# Patient Record
Sex: Female | Born: 1970 | Race: White | Hispanic: No | Smoking: Current every day smoker
Health system: Southern US, Community
[De-identification: ages and names within clinical notes are randomized; demographics above are authoritative.]

## PROBLEM LIST (undated history)

## (undated) DIAGNOSIS — M797 Fibromyalgia: Secondary | ICD-10-CM

## (undated) DIAGNOSIS — M549 Dorsalgia, unspecified: Secondary | ICD-10-CM

## (undated) DIAGNOSIS — G8929 Other chronic pain: Secondary | ICD-10-CM

## (undated) DIAGNOSIS — F419 Anxiety disorder, unspecified: Secondary | ICD-10-CM

## (undated) DIAGNOSIS — J449 Chronic obstructive pulmonary disease, unspecified: Secondary | ICD-10-CM

## (undated) DIAGNOSIS — D6851 Activated protein C resistance: Secondary | ICD-10-CM

## (undated) DIAGNOSIS — J370 Chronic laryngitis: Secondary | ICD-10-CM

## (undated) DIAGNOSIS — F329 Major depressive disorder, single episode, unspecified: Secondary | ICD-10-CM

## (undated) DIAGNOSIS — K219 Gastro-esophageal reflux disease without esophagitis: Secondary | ICD-10-CM

## (undated) DIAGNOSIS — M419 Scoliosis, unspecified: Secondary | ICD-10-CM

## (undated) DIAGNOSIS — F32A Depression, unspecified: Secondary | ICD-10-CM

## (undated) DIAGNOSIS — I10 Essential (primary) hypertension: Secondary | ICD-10-CM

## (undated) DIAGNOSIS — I509 Heart failure, unspecified: Secondary | ICD-10-CM

## (undated) DIAGNOSIS — F410 Panic disorder [episodic paroxysmal anxiety] without agoraphobia: Secondary | ICD-10-CM

## (undated) DIAGNOSIS — J189 Pneumonia, unspecified organism: Secondary | ICD-10-CM

## (undated) DIAGNOSIS — F909 Attention-deficit hyperactivity disorder, unspecified type: Secondary | ICD-10-CM

## (undated) DIAGNOSIS — IMO0002 Reserved for concepts with insufficient information to code with codable children: Secondary | ICD-10-CM

## (undated) DIAGNOSIS — T7840XA Allergy, unspecified, initial encounter: Secondary | ICD-10-CM

## (undated) DIAGNOSIS — I4891 Unspecified atrial fibrillation: Secondary | ICD-10-CM

## (undated) DIAGNOSIS — I219 Acute myocardial infarction, unspecified: Secondary | ICD-10-CM

## (undated) HISTORY — DX: Gastro-esophageal reflux disease without esophagitis: K21.9

## (undated) HISTORY — PX: NECK SURGERY: SHX720

## (undated) HISTORY — PX: TUBAL LIGATION: SHX77

## (undated) HISTORY — DX: Unspecified atrial fibrillation: I48.91

## (undated) HISTORY — DX: Chronic obstructive pulmonary disease, unspecified: J44.9

## (undated) HISTORY — PX: BACK SURGERY: SHX140

## (undated) HISTORY — DX: Allergy, unspecified, initial encounter: T78.40XA

## (undated) HISTORY — DX: Heart failure, unspecified: I50.9

## (undated) HISTORY — DX: Acute myocardial infarction, unspecified: I21.9

---

## 1999-03-09 ENCOUNTER — Ambulatory Visit: Admission: RE | Admit: 1999-03-09 | Discharge: 1999-03-09 | Payer: Self-pay | Admitting: Neurosurgery

## 1999-04-07 ENCOUNTER — Inpatient Hospital Stay (HOSPITAL_COMMUNITY): Admission: RE | Admit: 1999-04-07 | Discharge: 1999-04-08 | Payer: Self-pay | Admitting: Neurosurgery

## 1999-05-02 ENCOUNTER — Encounter: Admission: RE | Admit: 1999-05-02 | Discharge: 1999-05-02 | Payer: Self-pay | Admitting: Neurosurgery

## 1999-07-04 ENCOUNTER — Encounter: Admission: RE | Admit: 1999-07-04 | Discharge: 1999-07-04 | Payer: Self-pay | Admitting: Neurosurgery

## 1999-10-03 ENCOUNTER — Encounter: Admission: RE | Admit: 1999-10-03 | Discharge: 1999-10-03 | Payer: Self-pay | Admitting: Neurosurgery

## 2000-01-20 ENCOUNTER — Encounter: Admission: RE | Admit: 2000-01-20 | Discharge: 2000-01-20 | Payer: Self-pay | Admitting: Neurosurgery

## 2000-01-25 ENCOUNTER — Ambulatory Visit (HOSPITAL_COMMUNITY): Admission: RE | Admit: 2000-01-25 | Discharge: 2000-01-25 | Payer: Self-pay | Admitting: Neurosurgery

## 2000-11-01 ENCOUNTER — Ambulatory Visit (HOSPITAL_COMMUNITY): Admission: RE | Admit: 2000-11-01 | Discharge: 2000-11-01 | Payer: Self-pay

## 2000-12-14 ENCOUNTER — Encounter: Admission: RE | Admit: 2000-12-14 | Discharge: 2000-12-14 | Payer: Self-pay | Admitting: Neurosurgery

## 2000-12-14 ENCOUNTER — Encounter: Payer: Self-pay | Admitting: Neurosurgery

## 2002-10-19 ENCOUNTER — Ambulatory Visit (HOSPITAL_COMMUNITY): Admission: RE | Admit: 2002-10-19 | Discharge: 2002-10-19 | Payer: Self-pay | Admitting: Family Medicine

## 2002-10-19 ENCOUNTER — Encounter: Payer: Self-pay | Admitting: Family Medicine

## 2003-02-07 ENCOUNTER — Ambulatory Visit (HOSPITAL_COMMUNITY): Admission: RE | Admit: 2003-02-07 | Discharge: 2003-02-07 | Payer: Self-pay | Admitting: Family Medicine

## 2003-08-15 ENCOUNTER — Ambulatory Visit (HOSPITAL_COMMUNITY): Admission: RE | Admit: 2003-08-15 | Discharge: 2003-08-15 | Payer: Self-pay | Admitting: Family Medicine

## 2003-10-25 ENCOUNTER — Ambulatory Visit (HOSPITAL_COMMUNITY): Admission: RE | Admit: 2003-10-25 | Discharge: 2003-10-26 | Payer: Self-pay | Admitting: Neurosurgery

## 2004-08-21 ENCOUNTER — Ambulatory Visit (HOSPITAL_COMMUNITY): Admission: RE | Admit: 2004-08-21 | Discharge: 2004-08-21 | Payer: Self-pay | Admitting: Family Medicine

## 2004-09-17 ENCOUNTER — Emergency Department (HOSPITAL_COMMUNITY): Admission: EM | Admit: 2004-09-17 | Discharge: 2004-09-17 | Payer: Self-pay | Admitting: Emergency Medicine

## 2004-09-18 ENCOUNTER — Ambulatory Visit: Payer: Self-pay | Admitting: Orthopedic Surgery

## 2004-10-09 ENCOUNTER — Ambulatory Visit: Payer: Self-pay | Admitting: Orthopedic Surgery

## 2004-11-20 ENCOUNTER — Ambulatory Visit: Payer: Self-pay | Admitting: Orthopedic Surgery

## 2004-11-25 ENCOUNTER — Ambulatory Visit (HOSPITAL_COMMUNITY): Admission: RE | Admit: 2004-11-25 | Discharge: 2004-11-25 | Payer: Self-pay | Admitting: Orthopedic Surgery

## 2004-12-03 ENCOUNTER — Ambulatory Visit: Payer: Self-pay | Admitting: Orthopedic Surgery

## 2005-01-28 ENCOUNTER — Ambulatory Visit: Payer: Self-pay | Admitting: Orthopedic Surgery

## 2005-02-11 ENCOUNTER — Ambulatory Visit (HOSPITAL_COMMUNITY): Admission: RE | Admit: 2005-02-11 | Discharge: 2005-02-11 | Payer: Self-pay | Admitting: Neurosurgery

## 2005-02-16 ENCOUNTER — Inpatient Hospital Stay (HOSPITAL_COMMUNITY): Admission: RE | Admit: 2005-02-16 | Discharge: 2005-02-17 | Payer: Self-pay | Admitting: Neurosurgery

## 2005-06-01 ENCOUNTER — Emergency Department (HOSPITAL_COMMUNITY): Admission: EM | Admit: 2005-06-01 | Discharge: 2005-06-01 | Payer: Self-pay | Admitting: Emergency Medicine

## 2005-06-22 ENCOUNTER — Ambulatory Visit: Payer: Self-pay | Admitting: Orthopedic Surgery

## 2005-06-30 ENCOUNTER — Encounter (HOSPITAL_COMMUNITY): Admission: RE | Admit: 2005-06-30 | Discharge: 2005-07-30 | Payer: Self-pay | Admitting: Orthopedic Surgery

## 2005-08-03 ENCOUNTER — Ambulatory Visit: Payer: Self-pay | Admitting: Orthopedic Surgery

## 2005-08-12 ENCOUNTER — Ambulatory Visit (HOSPITAL_COMMUNITY): Admission: RE | Admit: 2005-08-12 | Discharge: 2005-08-12 | Payer: Self-pay | Admitting: Neurosurgery

## 2006-01-07 ENCOUNTER — Emergency Department (HOSPITAL_COMMUNITY): Admission: EM | Admit: 2006-01-07 | Discharge: 2006-01-08 | Payer: Self-pay | Admitting: Emergency Medicine

## 2006-09-01 ENCOUNTER — Encounter
Admission: RE | Admit: 2006-09-01 | Discharge: 2006-09-01 | Payer: Self-pay | Admitting: Physical Medicine and Rehabilitation

## 2007-01-17 ENCOUNTER — Emergency Department (HOSPITAL_COMMUNITY): Admission: EM | Admit: 2007-01-17 | Discharge: 2007-01-17 | Payer: Self-pay | Admitting: Emergency Medicine

## 2007-02-18 ENCOUNTER — Ambulatory Visit (HOSPITAL_COMMUNITY): Admission: RE | Admit: 2007-02-18 | Discharge: 2007-02-18 | Payer: Self-pay | Admitting: Family Medicine

## 2007-04-04 ENCOUNTER — Ambulatory Visit (HOSPITAL_COMMUNITY): Admission: RE | Admit: 2007-04-04 | Discharge: 2007-04-04 | Payer: Self-pay | Admitting: Family Medicine

## 2007-05-24 ENCOUNTER — Ambulatory Visit (HOSPITAL_COMMUNITY): Admission: RE | Admit: 2007-05-24 | Discharge: 2007-05-24 | Payer: Self-pay | Admitting: Family Medicine

## 2008-06-16 ENCOUNTER — Emergency Department (HOSPITAL_COMMUNITY): Admission: EM | Admit: 2008-06-16 | Discharge: 2008-06-16 | Payer: Self-pay | Admitting: Emergency Medicine

## 2008-06-18 ENCOUNTER — Ambulatory Visit (HOSPITAL_COMMUNITY): Payer: Self-pay | Admitting: Family Medicine

## 2008-06-18 ENCOUNTER — Encounter (HOSPITAL_COMMUNITY): Admission: RE | Admit: 2008-06-18 | Discharge: 2008-07-18 | Payer: Self-pay | Admitting: Family Medicine

## 2008-07-31 ENCOUNTER — Emergency Department (HOSPITAL_COMMUNITY): Admission: EM | Admit: 2008-07-31 | Discharge: 2008-07-31 | Payer: Self-pay | Admitting: Emergency Medicine

## 2008-09-21 ENCOUNTER — Ambulatory Visit (HOSPITAL_COMMUNITY): Admission: RE | Admit: 2008-09-21 | Discharge: 2008-09-21 | Payer: Self-pay | Admitting: Family Medicine

## 2008-10-26 ENCOUNTER — Ambulatory Visit (HOSPITAL_COMMUNITY): Admission: RE | Admit: 2008-10-26 | Discharge: 2008-10-26 | Payer: Self-pay | Admitting: Neurosurgery

## 2008-12-27 ENCOUNTER — Encounter: Admission: RE | Admit: 2008-12-27 | Discharge: 2008-12-27 | Payer: Self-pay | Admitting: Neurosurgery

## 2009-01-01 ENCOUNTER — Ambulatory Visit (HOSPITAL_COMMUNITY): Admission: RE | Admit: 2009-01-01 | Discharge: 2009-01-01 | Payer: Self-pay | Admitting: Neurosurgery

## 2009-03-05 ENCOUNTER — Encounter: Admission: RE | Admit: 2009-03-05 | Discharge: 2009-03-05 | Payer: Self-pay | Admitting: Neurosurgery

## 2009-05-30 ENCOUNTER — Encounter: Admission: RE | Admit: 2009-05-30 | Discharge: 2009-05-30 | Payer: Self-pay | Admitting: Neurosurgery

## 2009-08-29 ENCOUNTER — Encounter: Admission: RE | Admit: 2009-08-29 | Discharge: 2009-08-29 | Payer: Self-pay | Admitting: Neurosurgery

## 2009-09-23 ENCOUNTER — Ambulatory Visit (HOSPITAL_COMMUNITY): Admission: RE | Admit: 2009-09-23 | Discharge: 2009-09-24 | Payer: Self-pay | Admitting: Neurosurgery

## 2009-12-16 ENCOUNTER — Ambulatory Visit (HOSPITAL_COMMUNITY)
Admission: RE | Admit: 2009-12-16 | Discharge: 2009-12-16 | Payer: Self-pay | Source: Home / Self Care | Admitting: Neurosurgery

## 2010-01-28 ENCOUNTER — Ambulatory Visit (HOSPITAL_COMMUNITY)
Admission: RE | Admit: 2010-01-28 | Discharge: 2010-01-28 | Payer: Self-pay | Source: Home / Self Care | Attending: Neurosurgery | Admitting: Neurosurgery

## 2010-01-29 ENCOUNTER — Emergency Department (HOSPITAL_COMMUNITY)
Admission: EM | Admit: 2010-01-29 | Discharge: 2010-01-29 | Payer: Self-pay | Source: Home / Self Care | Admitting: Emergency Medicine

## 2010-02-01 ENCOUNTER — Encounter: Payer: Self-pay | Admitting: Orthopaedic Surgery

## 2010-02-01 ENCOUNTER — Encounter: Payer: Self-pay | Admitting: Family Medicine

## 2010-02-02 ENCOUNTER — Encounter: Payer: Self-pay | Admitting: Family Medicine

## 2010-02-13 ENCOUNTER — Other Ambulatory Visit (HOSPITAL_COMMUNITY): Payer: Medicaid Other

## 2010-02-13 LAB — TYPE AND SCREEN: Antibody Screen: NEGATIVE

## 2010-02-13 LAB — CBC
HCT: 44.3 % (ref 36.0–46.0)
Hemoglobin: 15.9 g/dL — ABNORMAL HIGH (ref 12.0–15.0)
MCHC: 35.9 g/dL (ref 30.0–36.0)
MCV: 95.7 fL (ref 78.0–100.0)
RDW: 13.1 % (ref 11.5–15.5)

## 2010-02-13 LAB — ABO/RH: ABO/RH(D): A POS

## 2010-02-13 LAB — SURGICAL PCR SCREEN

## 2010-02-17 ENCOUNTER — Encounter (HOSPITAL_COMMUNITY): Payer: Medicaid Other

## 2010-02-17 ENCOUNTER — Inpatient Hospital Stay (HOSPITAL_COMMUNITY)
Admission: RE | Admit: 2010-02-17 | Discharge: 2010-02-19 | DRG: 460 | Disposition: A | Discharge status: Home / Self Care | Payer: Medicaid Other | Source: Ambulatory Visit | Attending: Neurosurgery | Admitting: Neurosurgery

## 2010-02-17 ENCOUNTER — Inpatient Hospital Stay (HOSPITAL_COMMUNITY): Payer: Medicaid Other

## 2010-02-17 DIAGNOSIS — M51379 Other intervertebral disc degeneration, lumbosacral region without mention of lumbar back pain or lower extremity pain: Principal | ICD-10-CM | POA: Diagnosis present

## 2010-02-17 DIAGNOSIS — J4489 Other specified chronic obstructive pulmonary disease: Secondary | ICD-10-CM | POA: Diagnosis present

## 2010-02-17 DIAGNOSIS — Z472 Encounter for removal of internal fixation device: Secondary | ICD-10-CM

## 2010-02-17 DIAGNOSIS — Z79899 Other long term (current) drug therapy: Secondary | ICD-10-CM

## 2010-02-17 DIAGNOSIS — M5126 Other intervertebral disc displacement, lumbar region: Secondary | ICD-10-CM | POA: Diagnosis present

## 2010-02-17 DIAGNOSIS — F17200 Nicotine dependence, unspecified, uncomplicated: Secondary | ICD-10-CM | POA: Diagnosis present

## 2010-02-17 DIAGNOSIS — J449 Chronic obstructive pulmonary disease, unspecified: Secondary | ICD-10-CM | POA: Diagnosis present

## 2010-02-17 DIAGNOSIS — E669 Obesity, unspecified: Secondary | ICD-10-CM | POA: Diagnosis present

## 2010-02-17 DIAGNOSIS — M5137 Other intervertebral disc degeneration, lumbosacral region: Principal | ICD-10-CM | POA: Diagnosis present

## 2010-02-19 LAB — BASIC METABOLIC PANEL
Calcium: 8.5 mg/dL (ref 8.4–10.5)
Chloride: 106 mEq/L (ref 96–112)
Creatinine, Ser: 0.73 mg/dL (ref 0.4–1.2)
Sodium: 138 mEq/L (ref 135–145)

## 2010-02-20 NOTE — Op Note (Signed)
[name], [name]              ACCOUNT NO.:  [id number]  MEDICAL RECORD NO.:  [id number]           PATIENT TYPE:  I  LOCATION:  3009                         FACILITY:  [facility]  PHYSICIAN:  Donalee Citrin, M.D.        DATE OF BIRTH:  1970-09-05  DATE OF PROCEDURE:  02/17/2010 DATE OF DISCHARGE:                              OPERATIVE REPORT   PREOPERATIVE DIAGNOSIS:  Degenerative disk disease at L4-5 with ruptured disk at L4-5 right.  PROCEDURE:  L4-5 posterior lumbar interbody fusion and exploration of fusion, removal of hardware at L5-S1 using combination hybrid Telamon PEEK cages 10 x 22 mm packed with locally harvested autograft, mixed with Actifuse and tangent allograft wedge, 10 x 26 mm pedicle screw fixation using a 6.35 Legacy pedicle screw system, posterolateral arthrodesis at L4-5 using locally harvested autograft mixed with Actifuse.  SURGEON:  Donalee Citrin, MD.  ASSISTANT:  Kathaleen Maser. Pool, MD  ANESTHESIA:  General endotracheal.  HISTORY OF PRESENT ILLNESS:  The patient is a very pleasant 40 year old female who has had L5-S1 fusion many many years ago.  Initially, did very well.  However, over the last several weeks or months, had progressive worsening of right leg pain radiating down back of her leg and top of foot and big toe consistent with L5 nerve root pattern. Repeat MRI scan showed disk herniation at L4-5 and degenerative disk disease at L4-5 with severe lateral recess stenosis bilaterally.  Due to patient's failure of conservative treatment with anti-inflammatories, physical therapy, and injections and her old surgery being a lumbar fusion at L5-S1, patient was recommended decompression-stabilization procedure.  I went over risks and benefits of the operation with the patient.  She understands and agrees to proceed forward.  DESCRIPTION OF PROCEDURE:  The patient was brought to the OR, was induced under general anesthesia, was positioned prone on the  Wilson frame.  Back was prepped and draped in usual sterile fashion.  The old incision was opened up and extended cephalad.  The scar tissue was dissected free, and subperiosteal dissection was carried on the lamina of L4, exposing the T-piece at L4 and L5.  The old hardware was also exposed.  The old fusion was solid, so the nuts removed, the rods removed, and the S1 screws removed.  Then, the spinous process at L4 was removed.  Central decompression was begun.  Complete medial facetectomies were performed bilaterally.  There was marked facet arthropathy causing severe __________ pressure on the thecal sac, this was all teased away and removed in piecemeal fashion until a complete decompression had been completed and L4 nerve root and L5 nerve root had been unroofed.  The disk herniation was immediately identified, expressing itself underneath the axilla of L4 root just above the disk space at L4-5 on the right, so an annulotomy was made at the disk space on the right and this disk herniation was removed.  Then, attention was taken to the pedicle screw placement.  A pilot hole was drilled at L4 on the right, cannulated with awl probe with a tap of the 5/5 tap and then a 6 x 45 screw  inserted on the right L4.  In a similar fashion, the L4 screw was inserted on the left side.  After all screws had been placed, tensioning the interbody work, a distractor was placed on the left side. Disk space was cleaned out using a size 10 rotating cutter and chisel. Central disk was then subsequently removed.  A left-sided tangent allograft wedge was then inserted.  The distractor was removed on the right, fluoroscopy __________ to confirm depth and trajectory.  Then, in a similar fashion, disk space was prepared on the right,  the disk herniation had been completely incised.  There was no further stenosis. Local autograft was packed centrally.  A right-sided tangent was then inserted. After all the  interbody work had been done, fluoroscopy confirmed good position.  Attention was taken to the posterolateral space.  Aggressive decortication was carried out in the TPs and lateral gutters.  Remainder of the local autograft was packed posterolaterally. Then, the 50 mm rods were placed, tapped and tightened down at L5.  The L4 screw was compressing against L5, and a 420 cross-link was placed. All the neural foramina were reinspected and confirmed patency.  Gelfoam was laid on top of the dura.  Meticulous hemostasis was maintained.  A large Hemovac drain was placed.  The wound was closed in layers with Vicryl, and skin was closed with running 4-0 subcuticular.  Benzoin and Steri-Strips were applied.  The patient went to recovery room in stable condition.          ______________________________ Donalee Citrin, M.D.     GC/MEDQ  D:  02/17/2010  T:  02/18/2010  Job:  [id number]  Electronically Signed by Donalee Citrin M.D. on 02/20/2010 09:01:34 AM

## 2010-03-27 ENCOUNTER — Ambulatory Visit
Admission: RE | Admit: 2010-03-27 | Discharge: 2010-03-27 | Disposition: A | Discharge status: Home / Self Care | Payer: Medicaid Other | Source: Ambulatory Visit | Attending: Neurosurgery | Admitting: Neurosurgery

## 2010-03-27 ENCOUNTER — Other Ambulatory Visit: Payer: Self-pay | Admitting: Neurosurgery

## 2010-03-27 DIAGNOSIS — M5137 Other intervertebral disc degeneration, lumbosacral region: Secondary | ICD-10-CM

## 2010-03-27 DIAGNOSIS — M47817 Spondylosis without myelopathy or radiculopathy, lumbosacral region: Secondary | ICD-10-CM

## 2010-03-27 LAB — SURGICAL PCR SCREEN: MRSA, PCR: NEGATIVE

## 2010-03-27 LAB — CBC
HCT: 46.1 % — ABNORMAL HIGH (ref 36.0–46.0)
Hemoglobin: 16.8 g/dL — ABNORMAL HIGH (ref 12.0–15.0)
MCHC: 36.4 g/dL — ABNORMAL HIGH (ref 30.0–36.0)
MCV: 97.5 fL (ref 78.0–100.0)
RDW: 13.4 % (ref 11.5–15.5)
WBC: 11.9 10*3/uL — ABNORMAL HIGH (ref 4.0–10.5)

## 2010-04-18 LAB — CBC
HCT: 46.5 % — ABNORMAL HIGH (ref 36.0–46.0)
Hemoglobin: 16.4 g/dL — ABNORMAL HIGH (ref 12.0–15.0)
MCV: 97.7 fL (ref 78.0–100.0)
WBC: 9.3 10*3/uL (ref 4.0–10.5)

## 2010-05-27 ENCOUNTER — Other Ambulatory Visit: Payer: Self-pay | Admitting: Neurosurgery

## 2010-05-27 ENCOUNTER — Ambulatory Visit
Admission: RE | Admit: 2010-05-27 | Discharge: 2010-05-27 | Disposition: A | Discharge status: Home / Self Care | Payer: Medicaid Other | Source: Ambulatory Visit | Attending: Neurosurgery | Admitting: Neurosurgery

## 2010-05-27 DIAGNOSIS — M545 Low back pain: Secondary | ICD-10-CM

## 2010-05-30 NOTE — Op Note (Signed)
[name], [name]              ACCOUNT NO.:  [id number]   MEDICAL RECORD NO.:  [id number]          PATIENT TYPE:  OIB   LOCATION:  NA                           FACILITY:  [facility]   PHYSICIAN:  Donalee Citrin, M.D.        DATE OF BIRTH:  [birth year]   DATE OF PROCEDURE:  10/25/2003  DATE OF DISCHARGE:                                 OPERATIVE REPORT   PREOPERATIVE DIAGNOSIS:  Left S1 radiculopathy from a large ruptured disk at  L5-S1 left.   PROCEDURE:  Lumbar laminectomy and microdiskectomy, L5-S1 left, with  microscopic dissection of the left S1 nerve root.   SURGEON:  Donalee Citrin, M.D.   ASSISTANT:  Tia Alert, M.D.   ANESTHESIA:  General endotracheal.   HISTORY OF PRESENT ILLNESS:  The patient is a very pleasant 40 year old  female with longstanding back and left leg pain radiating down the outside  of her foot and bottom of the foot, consistent with S1 radiculopathy.  The  patient failed all forms of conservative treatment.  Preoperative imaging  showed large ruptured disk with free fragment compressing the left S1 nerve  root.  Due to the patient's failure of conservative treatment and physical  exam and preoperative imaging, the patient was recommended a left-sided  laminectomy and microdiskectomy, L5-S1 left.  I extensively went over the  risks and benefits of surgery with her.  She understands and agreed to  proceed forward.   The patient was brought into the OR, was induced under general anesthesia  and positioned prone on the Wilson frame.  The back was prepped and draped  in the routine sterile fashion.  A preop x-ray localized the L5 spinous  process.  A midline incision was made just inferior to this after  infiltration with 10 mL of lidocaine with epinephrine.  Subperiosteal  dissection was carried out in the lamina on the left at L5 and S1; however,  intraoperative x-ray confirmed localization of the L4-5 disk space, so  attention was taken one disk space below  this.  After the periosteal  dissection was carried out in the lamina of S1, the retractor was  repositioned and the inferior aspect of L5 and the medial aspect of the  facet complex was removed with a 3 mm Kerrison punch, the superior aspect of  the lamina of S1 was removed, and the neural foramen was also dissected out  with a Kerrison punch.  The ligamentum flavum was visualized.  This was  removed in piecemeal fashion, exposing the thecal sac.  Then the operating  microscope was draped and under microscopic illumination, the remainder of  the S1 nerve root was opened up.  During the distal foraminotomy, there was  noted to be a small little tear on the distal nerve root sleeve and the  dura.  The arachnoid appeared to be intact.  There was no spinal fluid  leaking.  This was __________ opened up in nerve.  It was felt that this  would not result in spinal fluid leakage; however, this was packed away and  attention was taken back  more proximal.  The undersurface of the gutter was  then underbitten.  The disk space was identified.  Epidural vein was  coagulated.  There was noted to be an annular rent with a free fragment  migrated underneath the proximal aspect of the nerve root.  This was teased  away with a blunt nerve hook.  Then the annulotomy was inspected.  The disk  space was radically cleaned out.  Several large fragments were removed from  the central compartment as well as lateral compartment.  At the end of the  diskectomy there was no further stenosis either on the thecal sac or S1  nerve root.  Then the wound was copiously irrigated and meticulous  hemostasis was maintained.  Gelfoam was overload on top of the dura, the  muscle and fascia reapproximated with 0 interrupted Vicryl, the subcutaneous  tissue  was closed with 2-0 interrupted Vicryl, and the skin was closed with a  running 4-0 subcuticular.  Benzoin and Steri-Strips were applied.  The  patient was taken from the  operating room in stable condition.  At the end  of the case, all needle counts and sponge counts were correct.       GC/MEDQ  D:  10/25/2003  T:  10/25/2003  Job:  [id number]

## 2010-05-30 NOTE — H&P (Signed)
[place]  Patient:    [name], [name]                     MRN: [id number] Adm. Date:  [id number] Disc. Date: [id number] Attending:  Tressie Stalker D                         History and Physical  CHIEF COMPLAINT:  Neck pain, right arm pain.  HISTORY OF PRESENT ILLNESS:  The patient is a 40 year old white female who has ad pain in her neck and arm over the last two years.  She has been treated by her primary physician with medications.  Unfortunately, she never improved.  She was worked up with a cervical MRI.  It demonstrated C5-6 degenerative joint disease, herniated nucleus pulposus, spinal stenosis.  The patient failed medical management and therefore weighed the risks and benefits and alternatives to surgery and decided to proceed with an anterior cervical diskectomy, fusion and plating.  The patient complains of neck pain with radiation to her right shoulder and proximal right arm.  PAST MEDICAL HISTORY:  Positive for sinus problems, anemia.  PAST SURGICAL HISTORY:  Cesarean sections x 3.  Dilatation and curettage in 1991, tubal ligation in 1994.  FAMILY MEDICAL HISTORY:  The patients mother is age 41 in poor health.  She has  cerebral palsy.  The patients father died age 1.  He was diabetic and used alcohol.  He had a positive myocardial infarction and intracerebral hemorrhage. A maternal grandmother has breast cancer.  SOCIAL HISTORY:  The patient is married.  She lives in [place].  She is unemployed. She smokes one-half pack a day of cigarettes x approximately 13 years.  I have advised her to quit smoking.  She denies ethanol or drug use.  REVIEW OF SYSTEMS:  Negative except as above.  PHYSICAL EXAMINATION:  GENERAL:  This is a pleasant and obese 40 year old white female in no apparent distress.  VITAL SIGNS:  Height 5 feet 1 inch, weight 184 pounds.  HEENT:  Normocephalic, atraumatic.  Pupils are equal, round and  reactive to light. Extraocular muscles are intact.  Sclerae are white.  Conjunctivae are pink. Oropharynx is benign.  Uvula is midline.  She had some missing teeth.  NECK:  Supple, no sign of masses, meningismus, deformities, tracheal deviation,  jugular venous distention, carotid bruits.  She has somewhat limited cervical range of motion.  Spurlings test was positive on the right, negative on the left. Lhermittes sign was not present.  THORAX:  Symmetrical.  LUNGS:  Clear to auscultation.  HEART:  Regular rate and rhythm.  ABDOMEN:  Obese, soft and nontender.  No guarding or rebound.  EXTREMITIES:  No obvious deformities.  BACK:  Examination demonstrates some diffuse tenderness to palpation.  No point  tenderness or deformities.  Straight leg raising test caused back pain on the right and negative on the left.  Faberes testing caused back pain on the right and negative on the left.  NEUROLOGIC:  The patient was alert and oriented x three.  Cranial nerves II-XII are grossly intact bilaterally.  Vision and hearing are grossly normal bilaterally.  Motor strength is 5/5 in the deltoid, biceps, triceps, hand grip, wrist extensor interosseous, psoas, quadriceps, gastrocnemius, extensor hallucis longus. Sensory examination demonstrates some decreased sensation in the right posterior lateral thigh, otherwise, normal.  Cerebellar examination is intact to rapid alternating movements of the upper extremities bilaterally.  Deep  tendon reflexes were 2/4 n the biceps, triceps, brachioradialis and quadriceps and gastrocnemius.  She has  bilateral flexor plantar reflexes, no ankle clonus.  DIAGNOSTIC STUDIES:  The patient had a cervical MRI performed a [facility] on March 09, 1999 which demonstrated herniated nucleus pulposus and spondylosis at C5-6 with spinal stenosis right greater than left neural foraminal stenosis.  ASSESSMENT AND PLAN:  C5-6 degenerative disk  disease, spondylosis, herniated nucleus pulposus, spinal stenosis.  I have discussed the situation with the patient and her husband and reviewed the MRI scan with them pointing out the abnormalities.  I have discussed the various treatment options with them including do nothing, continue medical management and surgery.  I described the procedure of C5-6 anterior cervical diskectomy, fusion and plating.  I have discussed the risks of surgery extensively.  The patient has weighed the risks, benefits and alternatives to surgery and would like to proceed with the operation on 04/07/99. DD:  04/07/99 TD:  04/07/99 Job: [id number]

## 2010-05-30 NOTE — Discharge Summary (Signed)
[place]  Patient:    [name], [name]                     MRN: [id number] Adm. Date:  [id number] Disc. Date: [id number] Attending:  Tressie Stalker D                           Discharge Summary  HISTORY OF PRESENT ILLNESS: For full details of this admission please refer to the transcribed History and Physical.  Briefly, the patient is a 40 year old white female who suffers from an approximate two year history of neck and right arm pain.  She had failed medical management and was worked up with a cervical MRI that demonstrated C5-6 degenerative disk disease, herniated nucleus pulposus, and spinal stenosis.  She therefore weighed the risks and benefits and alternatives of surgery and decided to proceed with an anterior cervical diskectomy and fusion and plating.  For past medical history, past surgical history, medications prior to admission, drug allergies, family medical history, social history, admission physical examination, imaging studies, assessment and plan, etc., please refer to the transcribed History and Physical.  HOSPITAL COURSE: I admitted the patient to [facility] on April 07, 1999 with a diagnosis of C5-6 degenerative disk disease, herniated nucleus pulposus, and spinal stenosis, and on the day of admission I performed a C5-6 anterior cervical diskectomy with interbody iliac crest allograft arthrodesis with anterior cervical plating (Codman) at C5-6.  The surgery went well without complications (for full details of this operation please refer to the transcribed operative report).  The patients postoperative course was essentially unremarkable.  She remained afebrile and vital signs were stable.  By postoperative day #1 she was eating well and ambulating well.  Her wound was healing well without signs of infection.  There was no hematoma or midline shift, and she was neurologically normal, and requesting discharge home.   I therefore discharged her home on April 08, 1999.  Of note, the patient did smoke while in the hospital.  I counseled her and highly advised she quit smoking for many reasons, including that it adversely effects spinal fusions and could lead to pseudoarthrosis/failed fusion.  DISPOSITION: The patient was given written discharge instructions and instructed to follow up with me in three weeks.  She is to wear her Aspen collar at all times.  DISCHARGE MEDICATIONS:  1. Valium 5 mg (#50 with one refill) 1 p.o. q.8h p.r.n. for muscle spasm.  2. Lortab 10 mg (#60 with one refill) 1 p.o. q.4h p.r.n. pain.  FINAL DIAGNOSES:  1. C5-6 degenerative disk disease.  2. Herniated nucleus pulposus.  3. Spinal stenosis.  OPERATION/PROCEDURE: C5-6 anterior cervical diskectomy with interbody iliac crest allograft arthrodesis with Codman anterior cervical plating. DD:  04/08/99 TD:  04/08/99 Job: [id number]

## 2010-05-30 NOTE — Op Note (Signed)
[place]. [address] [facility]  Patient:    [name], [name]                     MRN: [id number] Proc. Date: 04/07/99 Adm. Date:  [id number] Disc. Date: [id number] Attending:  Tressie Stalker D                           Operative Report  BRIEF HISTORY:  The patient is a 40 year old white female who suffered from approximately a two year history of neck and right arm pain.  She failed medical management and was worked up as an outpatient with a cervical MRI.  The MRI demonstrated C5-6 degenerative disk disease, herniated nucleus pulposus, spondylosis, spinal stenosis.  The patient therefore, weighed the risks, benefits and alternatives of surgery and decided to proceed with a C5-6 anterior cervical diskectomy and fusion plating.  PREOPERATIVE DIAGNOSIS:  C5-6 degenerative disk disease, herniated nucleus pulposus, spondylosis, spinal stenosis.  POSTOPERATIVE DIAGNOSIS:  C5-6 degenerative disk disease, herniated nucleus pulposus, spondylosis, spinal stenosis.  OPERATION PERFORMED:  C5-6 anterior cervical diskectomy, interbody iliac crest allograft arthrodesis, anterior cervical plating C5 to C6 using the Codman anterior cervical plate and 12 mm screws.  SURGEON:  Cristi Loron, M.D.  ASSISTANT:  Alanson Aly. Roxan Hockey, M.D.  ANESTHESIA:  General endotracheal.  ESTIMATED BLOOD LOSS:  Less than 100 cc.  SPECIMENS:  None.  DRAINS:  None.  COMPLICATIONS:  None.  DESCRIPTION OF PROCEDURE:  The patient was brought to the operating room by anesthesia team.  General endotracheal anesthesia was induced.  She remained in the supine position.  A roll was placed under her shoulder placing her neck in slight extension.  Her anterior cervical region was then prepared with Betadine scrub nd Betadine solution and sterile drapes were applied.  I then injected the area to be incised with Marcaine with epinephrine solution and used a scalpel to make a transverse  incision in her left anterior cervical region.  I used the Metzenbaum scissors to dissect down to the platysma muscle and divided it along the direction of the skin incision.  I then dissected medial to the sternocleidomastoid muscles, jugular vein and carotid artery with the Metzenbaum scissors.  I then bluntly dissected down to the anterior cervical spine, identified the esophagus and carefully retracted it medially.  I cleared the soft tissue from the anterior cervical spine with the Kittner swabs.  I then inserted a bent spinal needle into the exposed interspace.  I obtained an intraoperative radiograph that demonstrated that the needle was at C5-6.  I then used the electrocautery to detach the medial border of the longus coli muscle from the C5-6 interspace and inserted the Caspar self-retaining retractor for exposure.  I used the 15 blade scalpel to incise the C5-6 intervertebral disk and performed a partial diskectomy with the Carlens curets and the pituitary forceps.  I inserted distraction pins at C5 and C6 and distracted the C5-6 interspace.  I then decorticated the vertebral end plates at H4-7 with the Midas Rex high speed drill.  I drilled away the remainder of the intervertebral  disk with a high speed drill.  I thinned out the posterior longitudinal ligament and then incised the ligament with the arachnoid knife.  I removed the remainder of the posterior longitudinal ligament with the Kerrison punch undercutting the vertebral end plate at C5 and C6.  I went out bilateral to the uncovertebral joints and performed  generous foraminotomies about the bilateral C6 nerve roots, then palpated about the thecal sac and the bilateral C6 nerve roots and noted neural  structures to be well decompressed.  Having completed the decompression, I turned my attention to the arthrodesis.  obtained a 6 mm iliac crest tricortical allograft bone graft and trimmed it down  to approximately 1 cm in length and then inserted it into the distracted C5-6 intervertebral disk space.  I removed the distraction pins.  There was good snug fit of the bone graft.  I then obtained the appropriate length Codman anterior cervical plate, in this case a 26 mm plate, laid it on the anterior aspect of C5 and C6, drilled two holes in C5, two at C6, tapped these holes and secured the plate to the vertebral bodies with 12 mm screws.  I obtained intraoperative radiograph.  It demonstrated good position of the plate, screws, interbody graft. I then secured these screws to the plate using a Cam tightener at each screw.  then copiously irrigated the wound with bacitracin solution and removed the solution with suction.  I achieved stringent hemostasis with bipolar electrocautery.  I removed the Caspar self-retaining retractor and then inspected the esophagus for any damage.  There was none.  I then reapproximated the patients platysma muscle with #3-0 Vicryl, subcutaneous tissues with interrupted 3-0 Vicryl, the skin with Steri-Strips and benzoin.  The wound was then coated with bacitracin ointment.  Sterile dressings applied.  The drapes were removed.  The patient was subsequently extubated by the anesthesia team and transported to the post anesthesia care unit in stable condition.  All sponge, needle and instrument counts were verified correct at the end of this case. DD:  04/07/99 TD:  04/08/99 Job: [id number]

## 2010-05-30 NOTE — Op Note (Signed)
[name], [name]              ACCOUNT NO.:  [id number]   MEDICAL RECORD NO.:  [id number]          PATIENT TYPE:  OIB   LOCATION:  3032                         FACILITY:  [facility]   PHYSICIAN:  Donalee Citrin, M.D.        DATE OF BIRTH:  1970/04/28   DATE OF PROCEDURE:  02/16/2005  DATE OF DISCHARGE:                                 OPERATIVE REPORT   PREOPERATIVE DIAGNOSIS:  Recurrent herniated disk, L5-S1.  Degenerative disk  disease, L5-S1.  Diskogenic mechanical low back pain, L5-S1 and left-sided  S1 radiculopathy.   POSTOPERATIVE DIAGNOSIS:  Recurrent herniated disk, L5-S1.  Degenerative  disk disease, L5-S1.  Diskogenic mechanical low back pain, L5-S1 and left-  sided S1 radiculopathy.   OPERATION PERFORMED:  Redo decompressive laminectomy, L5-S1; posterior  lumbar interbody fusion, L5-S1 using a 10 x 26 mm Telemon cage packed with  locally harvested autograft and DBX bone substitute and a 10 x 26 mm tangent  allograft wedge.  Pedicle screw fixation, L5-S1 using the 6.35 Legacy  pedicle screw system with 6.5 x 45 screws inserted L5 and 6.5 x 30 on the  left at S1 and 6.5 x 36 on the right at S1.  Posterolateral arthrodesis L5-  S1 using locally harvested autograft mixed with DBX bone substitute,  placement of medium Hemovac drain.   SURGEON:  Donalee Citrin, M.D.   ASSISTANT:  Kathaleen Maser. Pool, M.D.   ANESTHESIA:  General endotracheal.   INDICATIONS FOR PROCEDURE:  The patient is a very pleasant 40 year old  female who a couple of years ago underwent laminectomy and microdiskectomy  on the left at L5-S1.  The patient over the last several months had  recurrent left-sided radicular pain consistent with an S1 radiculopathy.  Repeat imaging shows a large recurrent ruptured disk at L5-S1 and patient  had predominantly back pain with secondary radiculopathy worse when going  from lying to sitting and sitting to standing position consistent with  mechanical and diskogenic low back pain.   The patient had severe  degenerative disk disease with no changes in her end plates and due to this  predominance of her back pain symptoms, appearance on her MRI scan, the  patient was recommended decompressive laminectomy and fusion.  The risks and  benefits were explained to the patient and she understands and agrees to  proceed forward.   DESCRIPTION OF PROCEDURE:  The patient was brought to the operating room,  was induced under general anesthesia, was positioned prone on a Wilson  frame.  The back was prepped and draped in the usual sterile fashion.  Her  old incision was opened up and extended cephalad and caudally.  Subperiosteal dissection was carried out to the lamina of L5 on the right  and S1 on the right and the residual lamina of L5-S1 on the left with  transverse processes of L5 and S1 exposed bilaterally.  Self-retaining  retractor was placed.  Intraoperative x-ray confirmed location of the L5  pedicle on the right so the spinous process was then removed with Leksell  rongeur and complete decompressive laminectomy and medial facetectomy  was  performed initially on the right side, radically decompressing the 5 and the  S1 nerve root on the right. Then the attention taken to left side.  Extensive scar tissue was immediately visualized and dissected free with a 4  Penfield dissectors.  This allowed the plane to develop decompressing first  the 5 root and the facet complex.  Medial facet complex was removed and the  scar tissue was dissected off the S1 root mobilizing the S1 root off the  pedicle.  This was working from cephalad caudad direction with doing a  partial S1 laminectomy to get inferior to this and work up until the S1  nerve root and thecal sac was completely mobilized.  There was noted to be a  very large recurrent disk herniation on the left side at L5-S1 so initially  the epidural veins were coagulated and first on the right the diskectomy was  made with an 11  blade scalpel, pituitary rongeurs, clean out the disk.  10  distractor was inserted which had good apposition of the end plates based on  fluoroscopy.  Then on the left side the again the S1 nerve root was  mobilized medially with D'Errico retractor.  The annulotomy was made with 11  blade scalpel.  Several large fragments of disk were removed from the  central compartment and the recurrent disk was removed and using size 10  cutter and chisel the end plates were scraped and prepared to receive bone  graft.  Fluoroscopy confirmed each step along the way.  Then a  10 x 26 mm  Telemon cage packed with locally harvested autograft and DBX bone substitute  starting on the left side.  We distracted and subsequently removed and  fluoroscopy confirmed good position of the cage.  Then on the right the end  plates were prepared in similar fashion.  Locally harvested autograft packed  against the cage on the left and the right side tangent was inserted.  After  all interbody work had been placed and the fluoroscopy confirmed good  position of the grafts and this cage, attention was turned to the pedicle  screw fixation.  Using fluoroscopy and bony landmarks, high speed drill was  used to drill a pilot hole at the L5 pedicle on the left.  This was  cannulated with the awl, probed from within the pedicle as well as within  the canal, tapped with a 5.5 tap and 6.5 screw inserted on the left side at  L5.  Again the pedicle hole was probed at each step along the way from both  within the pedicle and within the canal confirming no medial or lateral  breech and the 6 x 45 screw was inserted with excellent purchase.  Then the  S1 screw was inserted as well and pedicle was noted to be competent again  each step along the way.  Then the right side screw was inserted in similar  fashion.  After all screws were inserted, the wound was copiously irrigated and meticulous hemostasis was maintained. Aggressive  decortication was  carried out on the transverse processes and lateral gutters.  The remainder  of the locally harvested autograft and DBX bone substitute was packed in the  lateral gutters.  Then 40 mm rods were inserted, top tightening nuts were  tightened down at S1 and the L5 pedicle screws compressed against S1.  At  this point the neural foramina were re-explored with hockey stick and  coronary dilator and the nerve  roots at L5 and S1 bilaterally were noted to  be widely decompressed and no graft material was left in the canal.  Then  Gelfoam was overlaid on top __________ medium drain was placed.  AP and  lateral projections confirmed good position of the screws and bone graft.  Then the wound was closed in layers with interrupted Vicryl and running 4-0  subcuticular skin.  Benzoin and Steri-Strips applied.  Patient went to  recovery room stable condition.  At end of the case sponge, needle and  instrument counts were correct.           ______________________________  Donalee Citrin, M.D.     GC/MEDQ  D:  02/16/2005  T:  02/17/2005  Job:  [id number]

## 2010-07-24 ENCOUNTER — Emergency Department (HOSPITAL_COMMUNITY)
Admission: EM | Admit: 2010-07-24 | Discharge: 2010-07-24 | Disposition: A | Discharge status: Home / Self Care | Payer: Medicaid Other | Attending: Emergency Medicine | Admitting: Emergency Medicine

## 2010-07-24 ENCOUNTER — Encounter: Payer: Self-pay | Admitting: *Deleted

## 2010-07-24 DIAGNOSIS — I10 Essential (primary) hypertension: Secondary | ICD-10-CM | POA: Insufficient documentation

## 2010-07-24 DIAGNOSIS — M4120 Other idiopathic scoliosis, site unspecified: Secondary | ICD-10-CM | POA: Insufficient documentation

## 2010-07-24 DIAGNOSIS — F17200 Nicotine dependence, unspecified, uncomplicated: Secondary | ICD-10-CM | POA: Insufficient documentation

## 2010-07-24 DIAGNOSIS — M542 Cervicalgia: Secondary | ICD-10-CM | POA: Insufficient documentation

## 2010-07-24 DIAGNOSIS — M62838 Other muscle spasm: Secondary | ICD-10-CM | POA: Insufficient documentation

## 2010-07-24 HISTORY — DX: Scoliosis, unspecified: M41.9

## 2010-07-24 HISTORY — DX: Panic disorder (episodic paroxysmal anxiety): F41.0

## 2010-07-24 HISTORY — DX: Essential (primary) hypertension: I10

## 2010-07-24 MED ORDER — HYDROCODONE-ACETAMINOPHEN 5-325 MG PO TABS
1.0000 | ORAL_TABLET | Freq: Once | ORAL | Status: AC
  Administered 2010-07-24: 1 via ORAL
  Filled 2010-07-24: qty 1

## 2010-07-24 MED ORDER — DIAZEPAM 5 MG PO TABS
5.0000 mg | ORAL_TABLET | Freq: Once | ORAL | Status: AC
  Administered 2010-07-24: 5 mg via ORAL
  Filled 2010-07-24: qty 1

## 2010-07-24 MED ORDER — METHOCARBAMOL 750 MG PO TABS: ORAL_TABLET | ORAL | Status: DC

## 2010-07-24 MED ORDER — ONDANSETRON HCL 4 MG PO TABS
4.0000 mg | ORAL_TABLET | Freq: Once | ORAL | Status: AC
  Administered 2010-07-24: 4 mg via ORAL
  Filled 2010-07-24: qty 1

## 2010-07-24 NOTE — ED Provider Notes (Signed)
History     Chief Complaint  Patient presents with  . Back Pain   Patient is a 40 y.o. female presenting with back pain. The history is provided by the patient.  Back Pain  This is a chronic problem. The problem occurs daily. The problem has been gradually worsening. Pain location: cervical/thoracic spine. The pain is at a severity of 10/10. The pain is severe. Associated symptoms include numbness. Pertinent negatives include no chest pain, no abdominal pain and no dysuria.    Past Medical History  Diagnosis Date  . Scoliosis   . Hypertension   . Panic attack     Past Surgical History  Procedure Date  . Back surgery   . Cesarean section   . Neck surgery     No family history on file.  History  Substance Use Topics  . Smoking status: Current Everyday Smoker -- 2.0 packs/day    Types: Cigarettes  . Smokeless tobacco: Not on file  . Alcohol Use: No    OB History    Grav Para Term Preterm Abortions TAB SAB Ect Mult Living                  Review of Systems  Constitutional: Negative for activity change.       All ROS Neg except as noted in HPI  HENT: Negative for nosebleeds and neck pain.   Eyes: Negative for photophobia and discharge.  Respiratory: Negative for cough, shortness of breath and wheezing.   Cardiovascular: Negative for chest pain and palpitations.  Gastrointestinal: Negative for abdominal pain and blood in stool.  Genitourinary: Negative for dysuria, frequency and hematuria.  Musculoskeletal: Positive for myalgias and back pain. Negative for arthralgias.  Skin: Negative.   Neurological: Positive for numbness. Negative for dizziness, seizures and speech difficulty.  Psychiatric/Behavioral: Negative for hallucinations and confusion.    Physical Exam  BP 158/99  Pulse 104  Temp(Src) 98.8 F (37.1 C) (Oral)  Resp 18  Ht 5\' 2"  (1.575 m)  Wt 240 lb (108.863 kg)  BMI 43.90 kg/m2  SpO2 98%  LMP 07/24/2010  Physical Exam  Nursing note and vitals  reviewed. Constitutional: She is oriented to person, place, and time. She appears well-developed and well-nourished.  Non-toxic appearance.  HENT:  Head: Normocephalic.  Right Ear: Tympanic membrane and external ear normal.  Left Ear: Tympanic membrane and external ear normal.  Eyes: EOM and lids are normal. Pupils are equal, round, and reactive to light.  Neck: Spinous process tenderness and muscular tenderness present. Carotid bruit is not present. Decreased range of motion present.  Cardiovascular: Normal rate, regular rhythm, normal heart sounds, intact distal pulses and normal pulses.   Pulmonary/Chest: Breath sounds normal. No respiratory distress.  Abdominal: Soft. Bowel sounds are normal. There is no tenderness. There is no guarding.  Lymphadenopathy:       Head (right side): No submandibular adenopathy present.       Head (left side): No submandibular adenopathy present.    She has no cervical adenopathy.  Neurological: She is alert and oriented to person, place, and time. She has normal strength. No cranial nerve deficit or sensory deficit.  Skin: Skin is warm and dry.  Psychiatric: Her speech is normal. Her mood appears anxious.    ED Course  Procedures  MDM I have reviewed nursing notes, vital signs, and all appropriate lab and imaging results for this patient.Kathie Dike, PA 07/24/10 1453  Donnetta Hutching, MD  08/20/10 1800 

## 2010-07-24 NOTE — ED Notes (Signed)
Pt states chronic pain to neck and back.

## 2010-08-28 ENCOUNTER — Other Ambulatory Visit: Payer: Self-pay | Admitting: Neurosurgery

## 2010-08-28 ENCOUNTER — Ambulatory Visit
Admission: RE | Admit: 2010-08-28 | Discharge: 2010-08-28 | Disposition: A | Discharge status: Home / Self Care | Payer: Medicaid Other | Source: Ambulatory Visit | Attending: Neurosurgery | Admitting: Neurosurgery

## 2010-08-28 ENCOUNTER — Other Ambulatory Visit (HOSPITAL_COMMUNITY): Payer: Self-pay | Admitting: Neurosurgery

## 2010-08-28 DIAGNOSIS — M542 Cervicalgia: Secondary | ICD-10-CM

## 2010-08-28 DIAGNOSIS — M79601 Pain in right arm: Secondary | ICD-10-CM

## 2010-08-28 DIAGNOSIS — M549 Dorsalgia, unspecified: Secondary | ICD-10-CM

## 2010-08-28 DIAGNOSIS — M545 Low back pain: Secondary | ICD-10-CM

## 2010-08-28 DIAGNOSIS — M79604 Pain in right leg: Secondary | ICD-10-CM

## 2010-09-05 ENCOUNTER — Ambulatory Visit (HOSPITAL_COMMUNITY)
Admission: RE | Admit: 2010-09-05 | Discharge: 2010-09-05 | Disposition: A | Discharge status: Home / Self Care | Payer: Medicaid Other | Source: Ambulatory Visit | Attending: Neurosurgery | Admitting: Neurosurgery

## 2010-09-05 DIAGNOSIS — M79609 Pain in unspecified limb: Secondary | ICD-10-CM | POA: Insufficient documentation

## 2010-09-05 DIAGNOSIS — M545 Low back pain, unspecified: Secondary | ICD-10-CM | POA: Insufficient documentation

## 2010-09-05 DIAGNOSIS — M79601 Pain in right arm: Secondary | ICD-10-CM

## 2010-09-05 DIAGNOSIS — M5020 Other cervical disc displacement, unspecified cervical region: Secondary | ICD-10-CM | POA: Insufficient documentation

## 2010-09-05 DIAGNOSIS — M549 Dorsalgia, unspecified: Secondary | ICD-10-CM

## 2010-09-05 DIAGNOSIS — M79604 Pain in right leg: Secondary | ICD-10-CM

## 2010-09-05 DIAGNOSIS — M542 Cervicalgia: Secondary | ICD-10-CM

## 2010-10-23 ENCOUNTER — Encounter (HOSPITAL_COMMUNITY): Payer: Self-pay | Admitting: *Deleted

## 2010-10-23 ENCOUNTER — Emergency Department (HOSPITAL_COMMUNITY)
Admission: EM | Admit: 2010-10-23 | Discharge: 2010-10-23 | Disposition: A | Discharge status: Home / Self Care | Payer: Medicaid Other | Attending: Emergency Medicine | Admitting: Emergency Medicine

## 2010-10-23 ENCOUNTER — Emergency Department (HOSPITAL_COMMUNITY): Payer: Medicaid Other

## 2010-10-23 DIAGNOSIS — F17200 Nicotine dependence, unspecified, uncomplicated: Secondary | ICD-10-CM | POA: Insufficient documentation

## 2010-10-23 DIAGNOSIS — M549 Dorsalgia, unspecified: Secondary | ICD-10-CM

## 2010-10-23 DIAGNOSIS — Y92009 Unspecified place in unspecified non-institutional (private) residence as the place of occurrence of the external cause: Secondary | ICD-10-CM | POA: Insufficient documentation

## 2010-10-23 DIAGNOSIS — M545 Low back pain, unspecified: Secondary | ICD-10-CM | POA: Insufficient documentation

## 2010-10-23 DIAGNOSIS — I10 Essential (primary) hypertension: Secondary | ICD-10-CM | POA: Insufficient documentation

## 2010-10-23 DIAGNOSIS — M4120 Other idiopathic scoliosis, site unspecified: Secondary | ICD-10-CM | POA: Insufficient documentation

## 2010-10-23 DIAGNOSIS — G8929 Other chronic pain: Secondary | ICD-10-CM | POA: Insufficient documentation

## 2010-10-23 DIAGNOSIS — F410 Panic disorder [episodic paroxysmal anxiety] without agoraphobia: Secondary | ICD-10-CM | POA: Insufficient documentation

## 2010-10-23 DIAGNOSIS — W1789XA Other fall from one level to another, initial encounter: Secondary | ICD-10-CM | POA: Insufficient documentation

## 2010-10-23 HISTORY — DX: Dorsalgia, unspecified: M54.9

## 2010-10-23 NOTE — ED Notes (Signed)
Larey Seat getting into the bathtub at 8pm. Pt being seen at the pain clinic and took pain medication prescribed (precocet 10/325) at time of fall without relief. Pt states pain at bottom of back and states throbbing, stabbing and moving down leg to foot and rates at 10.  No bruising or marks assessed  on back.

## 2010-10-23 NOTE — Discharge Instructions (Signed)
Apply heat alternating with ice for comfort. Continue your home medicines. Follow up with your doctor if you continue to have increased pain.

## 2010-10-23 NOTE — ED Notes (Signed)
Fell while taking shower at 2000 last night, now has lower back pain with pain radiating down right leg/foot, hx of back surgery

## 2010-11-10 NOTE — ED Provider Notes (Signed)
History     CSN: [id number] Arrival date & time: 10/23/2010  1:13 AM   None     Chief Complaint  Patient presents with  . Fall  . Back Pain    (Consider location/radiation/quality/duration/timing/severity/associated sxs/prior treatment) HPI Comments: Seen 0246. Patient fell while getting in the tub at 8 PM last night. Has a h/o chronic back pain. Now with increased pain to the lower back with radiation down her right leg and foot. She is a patient of a pain clinic.She took one of her percocet 10/325 without relief. In addition she has pain to the right thigh while hit the edge of the tub when she fell.  Patient is a 40 y.o. female presenting with fall and back pain. The history is provided by the patient.  Fall The accident occurred 3 to 5 hours ago. Incident: Larey Seat getting into the shower, hitting the right thigh and side of her body on the edge of the tub. She fell from a height of 1 to 2 ft. Impact surface: bathtub. There was no blood loss. Point of impact: right thigh and side. Pain location: lower back, right leg and foot. The pain is at a severity of 10/10. The pain is moderate. She was ambulatory at the scene. There was no entrapment after the fall. There was no drug use involved in the accident. There was no alcohol use involved in the accident. Pertinent negatives include no numbness and no tingling. The symptoms are aggravated by activity. Treatments tried: narcotic.  Back Pain  Pertinent negatives include no numbness and no tingling.    Past Medical History  Diagnosis Date  . Scoliosis   . Hypertension   . Panic attack   . Back pain     Past Surgical History  Procedure Date  . Back surgery   . Cesarean section   . Neck surgery     History reviewed. No pertinent family history.  History  Substance Use Topics  . Smoking status: Current Everyday Smoker -- 2.0 packs/day    Types: Cigarettes  . Smokeless tobacco: Not on file  . Alcohol Use: No    OB History    Grav Para Term Preterm Abortions TAB SAB Ect Mult Living                  Review of Systems  Musculoskeletal: Positive for back pain.       Right thigh pain  Neurological: Negative for tingling and numbness.  All other systems reviewed and are negative.    Allergies  Ibuprofen; Penicillins; and Aspirin  Home Medications   Current Outpatient Rx  Name Route Sig Dispense Refill  . ALPRAZOLAM 1 MG PO TABS Oral Take 1 mg by mouth at bedtime as needed.      Marland Kitchen CITALOPRAM HYDROBROMIDE 20 MG PO TABS Oral Take 20 mg by mouth daily.      Marland Kitchen ESOMEPRAZOLE MAGNESIUM 40 MG PO CPDR Oral Take 40 mg by mouth daily before breakfast.      . GABAPENTIN 300 MG PO CAPS Oral Take 300 mg by mouth at bedtime.      . MELOXICAM 7.5 MG PO TABS Oral Take 7.5 mg by mouth 2 (two) times daily.      Marland Kitchen METHOCARBAMOL 750 MG PO TABS Oral Take 750 mg by mouth 4 (four) times daily.      Marland Kitchen METHOCARBAMOL 750 MG PO TABS  1 po bid for spasm/pain 20 tablet 0  . OXYCODONE-ACETAMINOPHEN 5-325 MG PO TABS  Oral Take 2 tablets by mouth every 6 (six) hours as needed.       BP 126/81  Pulse 72  Temp(Src) 98.1 F (36.7 C) (Oral)  Resp 18  Ht 5\' 2"  (1.575 m)  Wt 170 lb (77.111 kg)  BMI 31.09 kg/m2  SpO2 99%  LMP 10/05/2010  Physical Exam  Nursing note and vitals reviewed. Constitutional: She is oriented to person, place, and time. She appears well-developed and well-nourished. No distress.  HENT:  Head: Normocephalic and atraumatic.  Eyes: EOM are normal.  Neck: Normal range of motion. Neck supple.  Cardiovascular: Normal rate, normal heart sounds and intact distal pulses.   Pulmonary/Chest: Breath sounds normal.  Musculoskeletal:       No spinal tenderness to percussion. Tenderness to lumbar paraspinal muscles bilaterally with palpation. No bruising or abrasions noted. Right thigh with no bruising or abrasions noted. Tenderness with palpation over anterior surface of right thigh.Negative straight leg raise  bilaterally.Gait steady.  Neurological: She is alert and oriented to person, place, and time.  Skin: Skin is warm and dry.    ED Course  Procedures (including critical care time)  Labs Reviewed - No data to display No results found.   1. Back pain       MDM  Patient with chronic back pain who fell getting into the shower last night. Took narcotic provided by pain clinic she is followed by for her chronic pain. Xrays of LS spine were normal. Patient with no mobility limitations, no bruising, no abrasions. Able to ambulate in the department without assistance.Pt stable in ED with no significant deterioration in condition.The patient appears reasonably screened and/or stabilized for discharge and I doubt any other medical condition or other [facility] requiring further screening, evaluation, or treatment in the ED at this time prior to discharge. MDM Reviewed: nursing note and vitals Interpretation: x-ray           Nicoletta Dress. Colon Branch, MD 11/10/10 1011

## 2010-11-20 ENCOUNTER — Ambulatory Visit (HOSPITAL_COMMUNITY)
Admission: RE | Admit: 2010-11-20 | Discharge: 2010-11-20 | Disposition: A | Discharge status: Home / Self Care | Payer: Medicaid Other | Source: Ambulatory Visit | Attending: Neurology | Admitting: Neurology

## 2010-11-20 DIAGNOSIS — I10 Essential (primary) hypertension: Secondary | ICD-10-CM | POA: Insufficient documentation

## 2010-11-20 DIAGNOSIS — R29898 Other symptoms and signs involving the musculoskeletal system: Secondary | ICD-10-CM | POA: Insufficient documentation

## 2010-11-20 DIAGNOSIS — M545 Low back pain, unspecified: Secondary | ICD-10-CM | POA: Insufficient documentation

## 2010-11-20 DIAGNOSIS — IMO0001 Reserved for inherently not codable concepts without codable children: Secondary | ICD-10-CM | POA: Insufficient documentation

## 2010-11-20 DIAGNOSIS — M6281 Muscle weakness (generalized): Secondary | ICD-10-CM | POA: Insufficient documentation

## 2010-11-20 NOTE — Progress Notes (Signed)
Physical Therapy Evaluation  Patient Details  Name: [name] MRN: [id number] Date of Birth: June 19, 1970  Today's Date: 11/20/2010 Time: 7829-5621 Time Calculation (min): 37 min Visit#: 1  of 12   Re-eval: 12/20/10 Assessment Diagnosis: Low back pain Surgical Date: 02/12/10 Next MD Visit: 11/27/10 Prior Therapy: none  Past Medical History:  Past Medical History  Diagnosis Date  . Scoliosis   . Hypertension   . Panic attack   . Back pain    Past Surgical History:  Past Surgical History  Procedure Date  . Back surgery   . Cesarean section   . Neck surgery     Subjective Symptoms/Limitations Symptoms: The patient states that she had neck surgery in Sept. 2011 and back surgery in Feb of 2012.  The patient states that her surgeries did not help her pain but she is still having significant pain in her back down to her toes.  She states that she has the radicular pain every day.  The patient has been referrd to PT to decrease her symptoms of pain and increase her functinal ability. How long can you sit comfortably?: The patient states that she can sit for 10 -15 minutes. How long can you stand comfortably?: The patient states that she is only able to stand for 10 minutes. How long can you walk comfortably?: The pateints states that she is able to walk for 15 minutes.   Pain Assessment Currently in Pain?: Yes Pain Score:   7 Pain Location: Back Pain Orientation: Right;Left Pain Type: Chronic pain Pain Radiating Towards: to R ankle Pain Onset: More than a month ago Pain Frequency: Constant Pain Relieving Factors: Taking pain medication.  Effect of Pain on Daily Activities: increases. Multiple Pain Sites: Yes  Assessment RLE Strength Right Hip Flexion: 3-/5 Right Hip Extension: 3-/5 Right Hip ABduction: 3-/5 Right Hip ADduction: 3-/5 Right Knee Flexion: 3-/5 Right Knee Extension: 3/5 Right Ankle Dorsiflexion: 3+/5 LLE Strength Left Hip Flexion: 3-/5 Left Hip  Extension: 3-/5 Left Hip ABduction: 3-/5 Left Hip ADduction: 3-/5 Left Knee Flexion: 3-/5 Left Knee Extension: 3-/5 Left Ankle Dorsiflexion: 3/5 Lumbar AROM Lumbar Flexion: deecreased 30% Lumbar Extension: decreased 30% Lumbar - Right Rotation: decreased 30%  Lumbar - Left Rotation: decreased 30%  Exercise/Treatments Stretches Active Hamstring Stretch: 3 reps;30 seconds Single Knee to Chest Stretch: 3 reps Lumbar Exercises   Stability Bridge: 5 reps Bent Knee Raise: 5 reps Ab Set: 5 reps    Physical Therapy Assessment and Plan PT Assessment and Plan Clinical Impression Statement: Pt with decreased ROM, decreased strength and pain affecting her ability to complete ADL's who will benefit from skilled therapy to improve functional level. Rehab Potential: Good Clinical Impairments Affecting Rehab Potential: pain, decreased ROM, weakness. PT Frequency: Min 2X/week PT Duration: 4 weeks PT Treatment/Interventions: Therapeutic exercise PT Plan: Pt to be seen 2x for 6 weeks Pt is a medicaid pt will need to sign release after 4th visit.  Add hip flex isometric, floating SLR, TM and sidelying Ab next visit.    Goals Home Exercise Program Pt will Perform Home Exercise Program: Independently PT Short Term Goals Time to Complete Short Term Goals: 2 weeks PT Short Term Goal 1: Pt to be able to sit for 30 min without increase pain PT Short Term Goal 2: Able to stand fot 20 min. PT Short Term Goal 3: able to walk for 30 min without increased pain PT Short Term Goal 4: Pain to be no further than her knee PT Long  Term Goals Time to Complete Long Term Goals: 4 weeks PT Long Term Goal 1: Pt pain to be no greater than a 5 PT Long Term Goal 2:  Pt to be able to sit for an hour without increased pain. Long Term Goal 3: Pt to be able to stand for 30 min without increased pain Long Term Goal 4: Patient to be able to walk for an hour without increased pain PT Long Term Goal 5: Pain to be  radiating no further than her hip  Problem List Patient Active Problem List  Diagnoses  . Bilateral leg weakness    PT - End of Session Activity Tolerance: Patient tolerated treatment well General Behavior During Session: [facility] for tasks performed Cognition: [facility] for tasks performed   Mizuki Hoel,CINDY 11/20/2010, 4:34 PM  Physician Documentation Your signature is required to indicate approval of the treatment plan as stated above.  Please sign and either send electronically or make a copy of this report for your files and return this physician signed original.   Please mark one 1.__approve of plan  2. ___approve of plan with the following conditions.   ______________________________                                                          _____________________ Physician Signature                                                                                                             Date

## 2010-11-20 NOTE — Patient Instructions (Addendum)
HEP

## 2010-11-25 ENCOUNTER — Ambulatory Visit (HOSPITAL_COMMUNITY)
Admission: RE | Admit: 2010-11-25 | Discharge: 2010-11-25 | Disposition: A | Discharge status: Home / Self Care | Payer: Medicaid Other | Source: Ambulatory Visit

## 2010-11-25 NOTE — Progress Notes (Signed)
Physical Therapy Treatment Patient Details  Name: ADY HEIMANN MRN: [id number] Date of Birth: [birth year]  Today's Date: 11/25/2010 Time: 4098-1191 Time Calculation (min): 43 min Visit#: 2  of 12   Re-eval: 12/20/10  Charge: therex 38 min  Subjective: Symptoms/Limitations Symptoms: Pt 10 min late, pain scale 8/10 R side back and R LE.  Had pain meds at 7:00 this am. Pain Assessment Pain Score:   8 Pain Location: Back Pain Orientation: Right Pain Type: Chronic pain Pain Radiating Towards: down R LE  Objective:   Exercise/Treatments Stretches Active Hamstring Stretch: 3 reps;30 seconds Single Knee to Chest Stretch: 3 reps;10 seconds Stability Bridge: 5 reps Bent Knee Raise: 5 reps Ab Set: 10 reps;5 seconds Isometric Hip Flexion: 5 reps;5 seconds Straight Leg Raise: 5 reps;Limitations Straight Leg Raises Limitations: floating SLR Hip Abduction: Side-lying;5 reps;Limitations Hip Abduction Limitations: Abduction/Adduction TM: 5' @ 1.2-1.0 w vc for posture and equal stride length  Physical Therapy Assessment and Plan PT Assessment and Plan Clinical Impression Statement: Pt required vc for posture and equal stride lengt with gait on TM.  Pt presented with instability with supine therex, min vc for proper form/stability.  Visible fatigue shown with floating SLR.  Pt c/o of burning down R LE during the floating therex.  Pt able to tolerate treatment with no c/o of increased pain just the burning sensation down R LE. PT Plan: Continue progressing core stability, strength and reducing pain.  Pt needs to sign release x 2 more sessions per medicaid.  Add clam and prone therex if tolerable next session for stability..    Goals    Problem List Patient Active Problem List  Diagnoses  . Bilateral leg weakness    PT - End of Session Activity Tolerance: Patient tolerated treatment well General Behavior During Session: [facility] for tasks performed Cognition: [facility] for tasks  performed  Juel Burrow 11/25/2010, 11:00 AM

## 2010-11-28 ENCOUNTER — Telehealth (HOSPITAL_COMMUNITY): Payer: Self-pay

## 2010-11-28 ENCOUNTER — Ambulatory Visit (HOSPITAL_COMMUNITY): Admission: RE | Admit: 2010-11-28 | Payer: Medicaid Other | Source: Ambulatory Visit

## 2010-12-02 ENCOUNTER — Ambulatory Visit (HOSPITAL_COMMUNITY)
Admission: RE | Admit: 2010-12-02 | Discharge: 2010-12-02 | Disposition: A | Discharge status: Home / Self Care | Payer: Medicaid Other | Source: Ambulatory Visit | Attending: Neurology | Admitting: Neurology

## 2010-12-02 NOTE — Progress Notes (Signed)
Physical Therapy Treatment Patient Details  Name: [name] MRN: [id number] Date of Birth: 1970/08/10  Today's Date: 12/02/2010 Time: 4098-1191 Time Calculation (min): 38 min Charges: 32' TE Visit#: 3  of 12   Re-eval: 12/20/10    Subjective: Symptoms/Limitations Symptoms: Pt reports that she is still having a lot of pain which doesn't help since she is having her menstral cycle.  Pt reports that she has taken pain medication today.  Pain Assessment Currently in Pain?: Yes  Exercise/Treatments Stretches Active Hamstring Stretch: Limitations Active Hamstring Stretch Limitations: RLE nerve glides to hip, knee and ankle x10 each Hip Flexor Stretch: 3 reps;30 seconds;Limitations Hip Flexor Stretch Limitations: Quad Stretch Stability Clam: 15 reps;Limitations Clam Limitations: BLE Bridge: 15 reps Bent Knee Raise: 10 reps Isometric Hip Flexion: 5 reps;5 seconds Straight Leg Raise: 10 reps Straight Leg Raises Limitations: floating SLR Hip Abduction: Limitations Hip Abduction Limitations: Prone Hip ER and IR stretch 3x30 BLE Single Arm Raise: 10 reps Leg Raise: 10 reps Opposite Arm/Leg Raise: Prone;Right arm/Left leg;Left arm/Right leg;10 reps Plank: Roll in and outs SUPINE 10x each  Physical Therapy Assessment and Plan PT Assessment and Plan Clinical Impression Statement: She is limited by L hip IR and ER.  Continued to have mild radiating pain to her R LE after nerve glides and treatment.  Pt able to complete exercises with appropriate ab set with mod cueing by the end of the session. She has poor multifidus activation during opp arm/leg exercises.  PT Plan: Cont to progress.     Goals    Problem List Patient Active Problem List  Diagnoses  . Bilateral leg weakness    PT - End of Session Activity Tolerance: Patient tolerated treatment well  Baron Parmelee 12/02/2010, 2:32 PM

## 2010-12-03 ENCOUNTER — Ambulatory Visit (HOSPITAL_COMMUNITY): Payer: Medicaid Other | Admitting: Physical Therapy

## 2011-04-21 ENCOUNTER — Other Ambulatory Visit: Payer: Self-pay | Admitting: Anesthesiology

## 2011-04-21 DIAGNOSIS — M545 Low back pain: Secondary | ICD-10-CM

## 2011-04-21 DIAGNOSIS — M961 Postlaminectomy syndrome, not elsewhere classified: Secondary | ICD-10-CM

## 2011-04-21 DIAGNOSIS — M629 Disorder of muscle, unspecified: Secondary | ICD-10-CM

## 2011-04-21 DIAGNOSIS — M533 Sacrococcygeal disorders, not elsewhere classified: Secondary | ICD-10-CM

## 2011-04-21 DIAGNOSIS — M5430 Sciatica, unspecified side: Secondary | ICD-10-CM

## 2011-04-21 DIAGNOSIS — S335XXA Sprain of ligaments of lumbar spine, initial encounter: Secondary | ICD-10-CM

## 2011-04-21 DIAGNOSIS — M5137 Other intervertebral disc degeneration, lumbosacral region: Secondary | ICD-10-CM

## 2011-04-21 DIAGNOSIS — G608 Other hereditary and idiopathic neuropathies: Secondary | ICD-10-CM

## 2011-04-22 ENCOUNTER — Ambulatory Visit (HOSPITAL_COMMUNITY): Payer: Medicaid Other

## 2011-04-23 ENCOUNTER — Ambulatory Visit (HOSPITAL_COMMUNITY): Payer: Medicaid Other

## 2011-04-23 ENCOUNTER — Ambulatory Visit (HOSPITAL_COMMUNITY)
Admission: RE | Admit: 2011-04-23 | Discharge: 2011-04-23 | Disposition: A | Discharge status: Home / Self Care | Payer: Medicaid Other | Source: Ambulatory Visit | Attending: Anesthesiology | Admitting: Anesthesiology

## 2011-04-23 DIAGNOSIS — G608 Other hereditary and idiopathic neuropathies: Secondary | ICD-10-CM

## 2011-04-23 DIAGNOSIS — M5430 Sciatica, unspecified side: Secondary | ICD-10-CM

## 2011-04-23 DIAGNOSIS — M629 Disorder of muscle, unspecified: Secondary | ICD-10-CM

## 2011-04-23 DIAGNOSIS — M545 Low back pain, unspecified: Secondary | ICD-10-CM

## 2011-04-23 DIAGNOSIS — M79609 Pain in unspecified limb: Secondary | ICD-10-CM | POA: Insufficient documentation

## 2011-04-23 DIAGNOSIS — M51379 Other intervertebral disc degeneration, lumbosacral region without mention of lumbar back pain or lower extremity pain: Secondary | ICD-10-CM

## 2011-04-23 DIAGNOSIS — S335XXA Sprain of ligaments of lumbar spine, initial encounter: Secondary | ICD-10-CM

## 2011-04-23 DIAGNOSIS — M961 Postlaminectomy syndrome, not elsewhere classified: Secondary | ICD-10-CM

## 2011-04-23 DIAGNOSIS — M5137 Other intervertebral disc degeneration, lumbosacral region: Secondary | ICD-10-CM

## 2011-04-23 DIAGNOSIS — M533 Sacrococcygeal disorders, not elsewhere classified: Secondary | ICD-10-CM

## 2011-04-23 DIAGNOSIS — M47817 Spondylosis without myelopathy or radiculopathy, lumbosacral region: Secondary | ICD-10-CM | POA: Insufficient documentation

## 2011-04-23 MED ORDER — GADOBENATE DIMEGLUMINE 529 MG/ML IV SOLN
16.0000 mL | Freq: Once | INTRAVENOUS | Status: AC | PRN
  Administered 2011-04-23: 16 mL via INTRAVENOUS

## 2011-05-25 ENCOUNTER — Encounter (HOSPITAL_COMMUNITY): Payer: Self-pay | Admitting: *Deleted

## 2011-05-25 ENCOUNTER — Emergency Department (HOSPITAL_COMMUNITY)
Admission: EM | Admit: 2011-05-25 | Discharge: 2011-05-25 | Disposition: A | Discharge status: Home / Self Care | Payer: Medicaid Other | Attending: Emergency Medicine | Admitting: Emergency Medicine

## 2011-05-25 ENCOUNTER — Emergency Department (HOSPITAL_COMMUNITY)
Admission: EM | Admit: 2011-05-25 | Discharge: 2011-05-25 | Disposition: A | Discharge status: Home / Self Care | Payer: Medicaid Other | Source: Home / Self Care | Attending: Emergency Medicine | Admitting: Emergency Medicine

## 2011-05-25 DIAGNOSIS — G8929 Other chronic pain: Secondary | ICD-10-CM

## 2011-05-25 DIAGNOSIS — I10 Essential (primary) hypertension: Secondary | ICD-10-CM | POA: Insufficient documentation

## 2011-05-25 DIAGNOSIS — F17200 Nicotine dependence, unspecified, uncomplicated: Secondary | ICD-10-CM | POA: Insufficient documentation

## 2011-05-25 DIAGNOSIS — M4120 Other idiopathic scoliosis, site unspecified: Secondary | ICD-10-CM | POA: Insufficient documentation

## 2011-05-25 DIAGNOSIS — M542 Cervicalgia: Secondary | ICD-10-CM | POA: Insufficient documentation

## 2011-05-25 DIAGNOSIS — Z79899 Other long term (current) drug therapy: Secondary | ICD-10-CM | POA: Insufficient documentation

## 2011-05-25 DIAGNOSIS — M549 Dorsalgia, unspecified: Secondary | ICD-10-CM | POA: Insufficient documentation

## 2011-05-25 DIAGNOSIS — M545 Low back pain, unspecified: Secondary | ICD-10-CM | POA: Insufficient documentation

## 2011-05-25 DIAGNOSIS — R1110 Vomiting, unspecified: Secondary | ICD-10-CM | POA: Insufficient documentation

## 2011-05-25 MED ORDER — LORAZEPAM 2 MG/ML IJ SOLN
2.0000 mg | Freq: Once | INTRAMUSCULAR | Status: AC
  Administered 2011-05-25: 2 mg via INTRAMUSCULAR
  Filled 2011-05-25: qty 1

## 2011-05-25 MED ORDER — HYDROMORPHONE HCL PF 2 MG/ML IJ SOLN
2.0000 mg | Freq: Once | INTRAMUSCULAR | Status: AC
  Administered 2011-05-25: 2 mg via INTRAMUSCULAR
  Filled 2011-05-25: qty 1

## 2011-05-25 MED ORDER — LORAZEPAM 1 MG PO TABS
2.0000 mg | ORAL_TABLET | Freq: Once | ORAL | Status: AC
  Administered 2011-05-25: 2 mg via ORAL
  Filled 2011-05-25: qty 2

## 2011-05-25 NOTE — ED Provider Notes (Addendum)
History     CSN: [id number]  Arrival date & time 05/25/11  1952   First MD Initiated Contact with Patient 05/25/11 2055      Chief Complaint  Patient presents with  . Emesis    (Consider location/radiation/quality/duration/timing/severity/associated sxs/prior treatment) Patient is a 41 y.o. female presenting with vomiting. The history is provided by the patient.  Emesis  This is a recurrent problem. The current episode started yesterday. The problem occurs continuously. The problem has been rapidly worsening. The emesis has an appearance of stomach contents. There has been no fever. Pertinent negatives include no chills and no fever.    Past Medical History  Diagnosis Date  . Scoliosis   . Hypertension   . Panic attack   . Back pain     Past Surgical History  Procedure Date  . Back surgery   . Cesarean section   . Neck surgery   . Tubal ligation     History reviewed. No pertinent family history.  History  Substance Use Topics  . Smoking status: Current Everyday Smoker -- 2.0 packs/day    Types: Cigarettes  . Smokeless tobacco: Not on file  . Alcohol Use: No    OB History    Grav Para Term Preterm Abortions TAB SAB Ect Mult Living                  Review of Systems  Constitutional: Negative for fever and chills.  Gastrointestinal: Positive for vomiting.  All other systems reviewed and are negative.    Allergies  Ibuprofen; Penicillins; and Aspirin  Home Medications   Current Outpatient Rx  Name Route Sig Dispense Refill  . ALPRAZOLAM 1 MG PO TABS Oral Take 2 mg by mouth 3 (three) times daily.     Marland Kitchen CITALOPRAM HYDROBROMIDE 20 MG PO TABS Oral Take 20 mg by mouth every evening.     Marland Kitchen GABAPENTIN 300 MG PO CAPS Oral Take 300 mg by mouth at bedtime.      . MELOXICAM 15 MG PO TABS Oral Take 15 mg by mouth every evening.    Marland Kitchen METHOCARBAMOL 750 MG PO TABS Oral Take 750 mg by mouth 4 (four) times daily.      Marland Kitchen METHOCARBAMOL 750 MG PO TABS  1 po bid for  spasm/pain 20 tablet 0  . OXYCODONE-ACETAMINOPHEN 10-325 MG PO TABS Oral Take 1 tablet by mouth 4 (four) times daily as needed. For pain    . PANTOPRAZOLE SODIUM 40 MG PO TBEC Oral Take 40 mg by mouth daily.      BP 134/83  Pulse 87  Temp(Src) 98.1 F (36.7 C) (Oral)  Resp 16  Ht 5\' 2"  (1.575 m)  Wt 170 lb (77.111 kg)  BMI 31.09 kg/m2  SpO2 98%  LMP 05/25/2011  Physical Exam  Nursing note and vitals reviewed. Constitutional: She is oriented to person, place, and time. She appears well-developed and well-nourished. No distress.  HENT:  Head: Normocephalic and atraumatic.  Neck: Normal range of motion. Neck supple.  Cardiovascular: Normal rate and regular rhythm.  Exam reveals no gallop and no friction rub.   No murmur heard. Pulmonary/Chest: Effort normal and breath sounds normal. No respiratory distress. She has no wheezes.  Abdominal: Soft. Bowel sounds are normal. She exhibits no distension. There is no tenderness.  Musculoskeletal: Normal range of motion.  Neurological: She is alert and oriented to person, place, and time.  Skin: Skin is warm and dry. She is not diaphoretic.  ED Course  Procedures (including critical care time)  Labs Reviewed - No data to display No results found.   No diagnosis found.    MDM  She was given dilaudid and ativan for her pain.  Prescriptions need to come from pcp.        Geoffery Lyons, MD 05/25/11 2206  Geoffery Lyons, MD 07/20/11 [phone]

## 2011-05-25 NOTE — ED Provider Notes (Signed)
History     CSN: [id number]  Arrival date & time 05/25/11  0014   First MD Initiated Contact with Patient 05/25/11 0217      Chief Complaint  Patient presents with  . Torticollis    (Consider location/radiation/quality/duration/timing/severity/associated sxs/prior treatment) HPI Comments: Patient reports neck pain, back pain "all of my spine" that is chronic in nature but worse over the past 2 days. She states she takes Percocet and Robaxin at home from the pain clinic. It is not controlling her pain. She denies any new injury. She denies any focal weakness, numbness, tingling, bowel or bladder incontinence, fever, chills. No chest pain, shortness of breath, abdominal pain or vomiting. She states she's having a panic attack because the pain is so bad.  The history is provided by the patient.    Past Medical History  Diagnosis Date  . Scoliosis   . Hypertension   . Panic attack   . Back pain     Past Surgical History  Procedure Date  . Back surgery   . Cesarean section   . Neck surgery     No family history on file.  History  Substance Use Topics  . Smoking status: Current Everyday Smoker -- 2.0 packs/day    Types: Cigarettes  . Smokeless tobacco: Not on file  . Alcohol Use: No    OB History    Grav Para Term Preterm Abortions TAB SAB Ect Mult Living                  Review of Systems  Constitutional: Negative for fever, activity change and appetite change.  HENT: Positive for neck pain. Negative for congestion, rhinorrhea and neck stiffness.   Respiratory: Negative for cough, chest tightness and shortness of breath.   Cardiovascular: Negative for chest pain.  Gastrointestinal: Negative for nausea, vomiting and abdominal pain.  Genitourinary: Negative for dysuria.  Musculoskeletal: Positive for myalgias, back pain and arthralgias.  Skin: Negative for rash.  Neurological: Negative for headaches.    Allergies  Ibuprofen; Penicillins; and Aspirin  Home  Medications   Current Outpatient Rx  Name Route Sig Dispense Refill  . ALPRAZOLAM 1 MG PO TABS Oral Take 1 mg by mouth at bedtime as needed.      Marland Kitchen CITALOPRAM HYDROBROMIDE 20 MG PO TABS Oral Take 20 mg by mouth daily.      Marland Kitchen ESOMEPRAZOLE MAGNESIUM 40 MG PO CPDR Oral Take 40 mg by mouth daily before breakfast.      . GABAPENTIN 300 MG PO CAPS Oral Take 300 mg by mouth at bedtime.      . MELOXICAM 7.5 MG PO TABS Oral Take 7.5 mg by mouth 2 (two) times daily.      Marland Kitchen METHOCARBAMOL 750 MG PO TABS Oral Take 750 mg by mouth 4 (four) times daily.      Marland Kitchen METHOCARBAMOL 750 MG PO TABS  1 po bid for spasm/pain 20 tablet 0  . OXYCODONE-ACETAMINOPHEN 5-325 MG PO TABS Oral Take 2 tablets by mouth every 6 (six) hours as needed.       BP 153/90  Pulse 85  Temp(Src) 98.2 F (36.8 C) (Oral)  Resp 20  Ht 5\' 2"  (1.575 m)  Wt 170 lb (77.111 kg)  BMI 31.09 kg/m2  SpO2 98%  LMP 05/25/2011  Physical Exam  Constitutional: She is oriented to person, place, and time. She appears well-developed and well-nourished. No distress.  HENT:  Head: Normocephalic and atraumatic.  Mouth/Throat: Oropharynx is clear and  moist. No oropharyngeal exudate.  Eyes: Conjunctivae and EOM are normal. Pupils are equal, round, and reactive to light.  Neck: Normal range of motion. Neck supple.       Diffuse paraspinal cervical tenderness  Cardiovascular: Normal rate, regular rhythm and normal heart sounds.   Pulmonary/Chest: Effort normal and breath sounds normal. No respiratory distress.  Abdominal: Soft. There is no tenderness. There is no rebound and no guarding.  Musculoskeletal: Normal range of motion. She exhibits tenderness.       TTP diffusely in paraspinal lumbar muscles  Neurological: She is alert and oriented to person, place, and time. No cranial nerve deficit.       5/5 strength throughout. Equal grip strengths bilaterally. +2 patellar reflexes bilaterally. Ankle plantar and dorsiflexion intact. Able to extend great  toes bilaterally.  +2 DP and PT pulses.  Skin: Skin is warm.    ED Course  Procedures (including critical care time)  Labs Reviewed - No data to display No results found.   No diagnosis found.    MDM  Acute on chronic neck and back pain. No new trauma. No neurological deficit. No incontinence, focal weakness, numbness or tingling. No red flags.  Will treat pain in ED without prescriptions.       Glynn Octave, MD 05/25/11 [phone]

## 2011-05-25 NOTE — Discharge Instructions (Signed)
Chronic Back Pain When back pain lasts longer than 3 months, it is called chronic back pain.This pain can be frustrating, but the cause of the pain is rarely dangerous.People with chronic back pain often go through certain periods that are more intense (flare-ups). CAUSES Chronic back pain can be caused by wear and tear (degeneration) on different structures in your back. These structures may include bones, ligaments, or discs. This degeneration may result in more pressure being placed on the nerves that travel to your legs and feet. This can lead to pain traveling from the low back down the back of the legs. When pain lasts longer than 3 months, it is not unusual for people to experience anxiety or depression. Anxiety and depression can also contribute to low back pain. TREATMENT  Establish a regular exercise plan. This is critical to improving your functional level.   Have a self-management plan for when you flare-up. Flare-ups rarely require a medical visit. Regular exercise will help reduce the intensity and frequency of your flare-ups.   Manage how you feel about your back pain and the rest of your life. Anxiety, depression, and feeling that you cannot alter your back pain have been shown to make back pain more intense and debilitating.   Medicines should never be your only treatment. They should be used along with other treatments to help you return to a more active lifestyle.   Procedures such as injections or surgery may be helpful but are rarely necessary. You may be able to get the same results with physical therapy or chiropractic care.  HOME CARE INSTRUCTIONS  Avoid bending, heavy lifting, prolonged sitting, and activities which make the problem worse.   Continue normal activity as much as possible.   Take brief periods of rest throughout the day to reduce your pain during flare-ups.   Follow your back exercise rehabilitation program. This can help reduce symptoms and prevent  more pain.   Only take over-the-counter or prescription medicines as directed by your caregiver. Muscle relaxants are sometimes prescribed. Narcotic pain medicine is discouraged for long-term pain, since addiction is a possible outcome.   If you smoke, quit.   Eat healthy foods and maintain a recommended body weight.  SEEK IMMEDIATE MEDICAL CARE IF:   You have weakness or numbness in one of your legs or feet.   You have trouble controlling your bladder or bowels.   You develop nausea, vomiting, abdominal pain, shortness of breath, or fainting.  Document Released: 02/06/2004 Document Revised: 12/18/2010 Document Reviewed: 12/13/2010 [employer] Patient Information 2012 [employer]. 

## 2011-05-25 NOTE — ED Notes (Signed)
Pt reports chronic neck pain, reports pain has gotten worse over the past couple of days and her pain meds are not helping

## 2011-05-25 NOTE — ED Notes (Signed)
Pt states her pain in her neck is improved and her panic attack is resolved.  She however reports her pain in her back and legs is unchanged.  Nursing staff to continue to monitor.

## 2011-05-25 NOTE — ED Notes (Signed)
Pt requesting PO fluids however still complains of nausea, PO fluids not given at this time

## 2011-05-25 NOTE — ED Notes (Signed)
Vomiting , diarrhea/  Seen here last night for back and leg pain.

## 2011-05-25 NOTE — Discharge Instructions (Signed)
Back Pain, Adult Low back pain is very common. About 1 in 5 people have back pain.The cause of low back pain is rarely dangerous. The pain often gets better over time.About half of people with a sudden onset of back pain feel better in just 2 weeks. About 8 in 10 people feel better by 6 weeks.  CAUSES Some common causes of back pain include:  Strain of the muscles or ligaments supporting the spine.   Wear and tear (degeneration) of the spinal discs.   Arthritis.   Direct injury to the back.  DIAGNOSIS Most of the time, the direct cause of low back pain is not known.However, back pain can be treated effectively even when the exact cause of the pain is unknown.Answering your caregiver's questions about your overall health and symptoms is one of the most accurate ways to make sure the cause of your pain is not dangerous. If your caregiver needs more information, he or she may order lab work or imaging tests (X-rays or MRIs).However, even if imaging tests show changes in your back, this usually does not require surgery. HOME CARE INSTRUCTIONS For many people, back pain returns.Since low back pain is rarely dangerous, it is often a condition that people can learn to manageon their own.   Remain active. It is stressful on the back to sit or stand in one place. Do not sit, drive, or stand in one place for more than 30 minutes at a time. Take short walks on level surfaces as soon as pain allows.Try to increase the length of time you walk each day.   Do not stay in bed.Resting more than 1 or 2 days can delay your recovery.   Do not avoid exercise or work.Your body is made to move.It is not dangerous to be active, even though your back may hurt.Your back will likely heal faster if you return to being active before your pain is gone.   Pay attention to your body when you bend and lift. Many people have less discomfortwhen lifting if they bend their knees, keep the load close to their  bodies,and avoid twisting. Often, the most comfortable positions are those that put less stress on your recovering back.   Find a comfortable position to sleep. Use a firm mattress and lie on your side with your knees slightly bent. If you lie on your back, put a pillow under your knees.   Only take over-the-counter or prescription medicines as directed by your caregiver. Over-the-counter medicines to reduce pain and inflammation are often the most helpful.Your caregiver may prescribe muscle relaxant drugs.These medicines help dull your pain so you can more quickly return to your normal activities and healthy exercise.   Put ice on the injured area.   Put ice in a plastic bag.   Place a towel between your skin and the bag.   Leave the ice on for 15 to 20 minutes, 3 to 4 times a day for the first 2 to 3 days. After that, ice and heat may be alternated to reduce pain and spasms.   Ask your caregiver about trying back exercises and gentle massage. This may be of some benefit.   Avoid feeling anxious or stressed.Stress increases muscle tension and can worsen back pain.It is important to recognize when you are anxious or stressed and learn ways to manage it.Exercise is a great option.  SEEK MEDICAL CARE IF:  You have pain that is not relieved with rest or medicine.   You have   pain that does not improve in 1 week.   You have new symptoms.   You are generally not feeling well.  SEEK IMMEDIATE MEDICAL CARE IF:   You have pain that radiates from your back into your legs.   You develop new bowel or bladder control problems.   You have unusual weakness or numbness in your arms or legs.   You develop nausea or vomiting.   You develop abdominal pain.   You feel faint.  Document Released: 12/29/2004 Document Revised: 12/18/2010 Document Reviewed: 05/19/2010 [employer] Patient Information 2012 [employer]. 

## 2011-05-25 NOTE — ED Notes (Signed)
Pt reports she is having a panic attack and can't breathe due to her pain.  O2 sats are 97% on room air and heart rate is 85. No acute distress is noted at this time.  Dr. Manus Gunning notified.  Nursing staff to continue to monitor.

## 2011-06-04 ENCOUNTER — Emergency Department (HOSPITAL_COMMUNITY)
Admission: EM | Admit: 2011-06-04 | Discharge: 2011-06-04 | Disposition: A | Discharge status: Home / Self Care | Payer: Medicaid Other | Attending: Emergency Medicine | Admitting: Emergency Medicine

## 2011-06-04 ENCOUNTER — Encounter (HOSPITAL_COMMUNITY): Payer: Self-pay | Admitting: *Deleted

## 2011-06-04 DIAGNOSIS — I10 Essential (primary) hypertension: Secondary | ICD-10-CM | POA: Insufficient documentation

## 2011-06-04 DIAGNOSIS — M545 Low back pain, unspecified: Secondary | ICD-10-CM | POA: Insufficient documentation

## 2011-06-04 DIAGNOSIS — Z79899 Other long term (current) drug therapy: Secondary | ICD-10-CM | POA: Insufficient documentation

## 2011-06-04 DIAGNOSIS — R209 Unspecified disturbances of skin sensation: Secondary | ICD-10-CM | POA: Insufficient documentation

## 2011-06-04 LAB — POCT PREGNANCY, URINE: Preg Test, Ur: NEGATIVE

## 2011-06-04 MED ORDER — METHOCARBAMOL 100 MG/ML IJ SOLN
1000.0000 mg | INTRAVENOUS | Status: AC
  Administered 2011-06-04: 1000 mg via INTRAVENOUS
  Filled 2011-06-04: qty 10

## 2011-06-04 MED ORDER — HYDROMORPHONE HCL PF 1 MG/ML IJ SOLN
1.0000 mg | Freq: Once | INTRAMUSCULAR | Status: AC
  Administered 2011-06-04: 1 mg via INTRAVENOUS
  Filled 2011-06-04: qty 1

## 2011-06-04 MED ORDER — METHOCARBAMOL 100 MG/ML IJ SOLN
INTRAMUSCULAR | Status: AC
  Filled 2011-06-04: qty 10

## 2011-06-04 MED ORDER — ONDANSETRON HCL 4 MG/2ML IJ SOLN
4.0000 mg | Freq: Once | INTRAMUSCULAR | Status: AC
Start: 2011-06-04 — End: 2011-06-04
  Administered 2011-06-04: 4 mg via INTRAVENOUS
  Filled 2011-06-04: qty 2

## 2011-06-04 NOTE — ED Notes (Signed)
Low back pain, nausea, vomiting yesterday, none today.

## 2011-06-04 NOTE — ED Notes (Signed)
Pt states that she has a high tolerance for pain medication pt usually takes percocet 10/650 4 times daily as a baseline
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
## 2011-06-04 NOTE — ED Notes (Signed)
Pt stable at discharge pt instructed  Not to drive tonight

## 2011-06-04 NOTE — ED Notes (Signed)
Pt reported to PA that she was having chest pain.  Pt says she has pain when she is anxious due to "panic attack"

## 2011-06-04 NOTE — Discharge Instructions (Signed)
Contact your pain management doctor tomorrow to let him know that you had to come to the emergency department tonight.  Back Pain, Adult Low back pain is very common. About 1 in 5 people have back pain.The cause of low back pain is rarely dangerous. The pain often gets better over time.About half of people with a sudden onset of back pain feel better in just 2 weeks. About 8 in 10 people feel better by 6 weeks.  CAUSES Some common causes of back pain include:  Strain of the muscles or ligaments supporting the spine.   Wear and tear (degeneration) of the spinal discs.   Arthritis.   Direct injury to the back.  DIAGNOSIS Most of the time, the direct cause of low back pain is not known.However, back pain can be treated effectively even when the exact cause of the pain is unknown.Answering your caregiver's questions about your overall health and symptoms is one of the most accurate ways to make sure the cause of your pain is not dangerous. If your caregiver needs more information, he or she may order lab work or imaging tests (X-rays or MRIs).However, even if imaging tests show changes in your back, this usually does not require surgery. HOME CARE INSTRUCTIONS For many people, back pain returns.Since low back pain is rarely dangerous, it is often a condition that people can learn to [facility] their own.   Remain active. It is stressful on the back to sit or stand in one place. Do not sit, drive, or stand in one place for more than 30 minutes at a time. Take short walks on level surfaces as soon as pain allows.Try to increase the length of time you walk each day.   Do not stay in bed.Resting more than 1 or 2 days can delay your recovery.   Do not avoid exercise or work.Your body is made to move.It is not dangerous to be active, even though your back may hurt.Your back will likely heal faster if you return to being active before your pain is gone.   Pay attention to your body when you  bend and lift. Many people have less discomfortwhen lifting if they bend their knees, keep the load close to their bodies,and avoid twisting. Often, the most comfortable positions are those that put less stress on your recovering back.   Find a comfortable position to sleep. Use a firm mattress and lie on your side with your knees slightly bent. If you lie on your back, put a pillow under your knees.   Only take over-the-counter or prescription medicines as directed by your caregiver. Over-the-counter medicines to reduce pain and inflammation are often the most helpful.Your caregiver may prescribe muscle relaxant drugs.These medicines help dull your pain so you can more quickly return to your normal activities and healthy exercise.   Put ice on the injured area.   Put ice in a plastic bag.   Place a towel between your skin and the bag.   Leave the ice on for 15 to 20 minutes, 3 to 4 times a day for the first 2 to 3 days. After that, ice and heat may be alternated to reduce pain and spasms.   Ask your caregiver about trying back exercises and gentle massage. This may be of some benefit.   Avoid feeling anxious or stressed.Stress increases muscle tension and can worsen back pain.It is important to recognize when you are anxious or stressed and learn ways to manage it.Exercise is a great option.  SEEK MEDICAL CARE IF:  You have pain that is not relieved with rest or medicine.   You have pain that does not improve in 1 week.   You have new symptoms.   You are generally not feeling well.  SEEK IMMEDIATE MEDICAL CARE IF:   You have pain that radiates from your back into your legs.   You develop new bowel or bladder control problems.   You have unusual weakness or numbness in your arms or legs.   You develop nausea or vomiting.   You develop abdominal pain.   You feel faint.  Document Released: 12/29/2004 Document Revised: 12/18/2010 Document Reviewed: 05/19/2010 [facility]  Patient Information 2012 [place], [place].

## 2011-06-06 ENCOUNTER — Emergency Department (HOSPITAL_COMMUNITY)
Admission: EM | Admit: 2011-06-06 | Discharge: 2011-06-06 | Disposition: A | Discharge status: Home / Self Care | Payer: Medicaid Other | Attending: Emergency Medicine | Admitting: Emergency Medicine

## 2011-06-06 ENCOUNTER — Encounter (HOSPITAL_COMMUNITY): Payer: Self-pay | Admitting: Emergency Medicine

## 2011-06-06 DIAGNOSIS — Z79899 Other long term (current) drug therapy: Secondary | ICD-10-CM | POA: Insufficient documentation

## 2011-06-06 DIAGNOSIS — M545 Low back pain, unspecified: Secondary | ICD-10-CM | POA: Insufficient documentation

## 2011-06-06 DIAGNOSIS — R110 Nausea: Secondary | ICD-10-CM | POA: Insufficient documentation

## 2011-06-06 DIAGNOSIS — M533 Sacrococcygeal disorders, not elsewhere classified: Secondary | ICD-10-CM | POA: Insufficient documentation

## 2011-06-06 DIAGNOSIS — M549 Dorsalgia, unspecified: Secondary | ICD-10-CM

## 2011-06-06 DIAGNOSIS — M79609 Pain in unspecified limb: Secondary | ICD-10-CM | POA: Insufficient documentation

## 2011-06-06 DIAGNOSIS — F17200 Nicotine dependence, unspecified, uncomplicated: Secondary | ICD-10-CM | POA: Insufficient documentation

## 2011-06-06 DIAGNOSIS — G8929 Other chronic pain: Secondary | ICD-10-CM | POA: Insufficient documentation

## 2011-06-06 DIAGNOSIS — Z9889 Other specified postprocedural states: Secondary | ICD-10-CM | POA: Insufficient documentation

## 2011-06-06 DIAGNOSIS — I10 Essential (primary) hypertension: Secondary | ICD-10-CM | POA: Insufficient documentation

## 2011-06-06 MED ORDER — CYCLOBENZAPRINE HCL 10 MG PO TABS
10.0000 mg | ORAL_TABLET | Freq: Once | ORAL | Status: AC
  Administered 2011-06-06: 10 mg via ORAL
  Filled 2011-06-06: qty 1

## 2011-06-06 MED ORDER — HYDROMORPHONE HCL PF 1 MG/ML IJ SOLN
1.0000 mg | Freq: Once | INTRAMUSCULAR | Status: AC
  Administered 2011-06-06: 1 mg via INTRAVENOUS
  Filled 2011-06-06: qty 1

## 2011-06-06 MED ORDER — ONDANSETRON HCL 4 MG/2ML IJ SOLN
4.0000 mg | Freq: Once | INTRAMUSCULAR | Status: AC
  Administered 2011-06-06: 4 mg via INTRAVENOUS
  Filled 2011-06-06: qty 2

## 2011-06-06 NOTE — ED Provider Notes (Signed)
Medical screening examination/treatment/procedure(s) were performed by non-physician practitioner and as supervising physician I was immediately available for consultation/collaboration.   [name], MD 06/06/11 1320

## 2011-06-06 NOTE — ED Provider Notes (Signed)
History     CSN: [id number]  Arrival date & time 06/06/11  8469   First MD Initiated Contact with Patient 06/06/11 0732      Chief Complaint  Patient presents with  . Back Pain    (Consider location/radiation/quality/duration/timing/severity/associated sxs/prior treatment) HPI Comments: Patient presents to the ED with a complaint of low back pain. She states that she has chronic low back pain and is bandaged by a pain management center in [place]. She takes Percocet and Robaxin daily, but states that medication is not controlling her pain at this time. She states that her pain increased several weeks ago after a fall.  She states the pain is constant and radiates across her lower back and into her right leg. She also complains of nausea and increased anxiety that she attributes to the increase in her level of pain. She denies any fever, dysuria, numbness, weakness, or incontinence of urine or feces.  Patient is a 41 y.o. female presenting with back pain. The history is provided by the patient.  Back Pain  This is a chronic problem. The current episode started more than 1 week ago. The problem occurs constantly. The problem has not changed since onset.The pain is associated with no known injury. The pain is present in the lumbar spine and sacro-iliac joint. The quality of the pain is described as aching. The pain radiates to the right thigh. The pain is moderate. The symptoms are aggravated by certain positions, twisting and bending. The pain is the same all the time. Associated symptoms include leg pain. Pertinent negatives include no chest pain, no fever, no numbness, no abdominal pain, no abdominal swelling, no bowel incontinence, no perianal numbness, no bladder incontinence, no dysuria, no pelvic pain, no paresthesias, no paresis, no tingling and no weakness. She has tried analgesics for the symptoms. The treatment provided no relief.    Past Medical History  Diagnosis Date  .  Scoliosis   . Hypertension   . Panic attack   . Back pain     Past Surgical History  Procedure Date  . Back surgery   . Cesarean section   . Neck surgery   . Tubal ligation     No family history on file.  History  Substance Use Topics  . Smoking status: Current Everyday Smoker -- 2.0 packs/day    Types: Cigarettes  . Smokeless tobacco: Not on file  . Alcohol Use: No    OB History    Grav Para Term Preterm Abortions TAB SAB Ect Mult Living                  Review of Systems  Constitutional: Negative for fever.  Respiratory: Negative for shortness of breath.   Cardiovascular: Negative for chest pain.  Gastrointestinal: Positive for nausea. Negative for vomiting, abdominal pain, constipation and bowel incontinence.  Genitourinary: Negative for bladder incontinence, dysuria, hematuria, flank pain, decreased urine volume, difficulty urinating and pelvic pain.       Perineal numbness or incontinence of urine or feces  Musculoskeletal: Positive for back pain. Negative for joint swelling.  Skin: Negative for rash.  Neurological: Negative for tingling, weakness, numbness and paresthesias.  All other systems reviewed and are negative.    Allergies  Ibuprofen; Penicillins; and Aspirin  Home Medications   Current Outpatient Rx  Name Route Sig Dispense Refill  . ALPRAZOLAM 1 MG PO TABS Oral Take 2 mg by mouth 3 (three) times daily.     Marland Kitchen CITALOPRAM HYDROBROMIDE 20  MG PO TABS Oral Take 20 mg by mouth every evening.     Marland Kitchen GABAPENTIN 300 MG PO CAPS Oral Take 300 mg by mouth at bedtime.      . MELOXICAM 15 MG PO TABS Oral Take 15 mg by mouth every evening.    Marland Kitchen METHOCARBAMOL 750 MG PO TABS Oral Take 750 mg by mouth 4 (four) times daily.      . OXYCODONE-ACETAMINOPHEN 10-325 MG PO TABS Oral Take 1 tablet by mouth 4 (four) times daily as needed. For pain    . PANTOPRAZOLE SODIUM 40 MG PO TBEC Oral Take 40 mg by mouth daily.      BP 150/95  Pulse 74  Temp(Src) 98.1 F (36.7  C) (Oral)  Resp 17  Ht 5\' 2"  (1.575 m)  Wt 170 lb (77.111 kg)  BMI 31.09 kg/m2  SpO2 99%  LMP 05/25/2011  Physical Exam  Nursing note and vitals reviewed. Constitutional: She is oriented to person, place, and time. She appears well-developed and well-nourished. No distress.  HENT:  Head: Normocephalic and atraumatic.  Neck: Normal range of motion. Neck supple.  Cardiovascular: Normal rate, regular rhythm, normal heart sounds and intact distal pulses.   No murmur heard. Pulmonary/Chest: Effort normal and breath sounds normal.  Musculoskeletal: She exhibits tenderness. She exhibits no edema.       Lumbar back: She exhibits tenderness and pain. She exhibits normal range of motion, no swelling, no deformity, no laceration and normal pulse.       Back:  Neurological: She is alert and oriented to person, place, and time. She displays no atrophy. No cranial nerve deficit or sensory deficit. She exhibits normal muscle tone. Coordination and gait normal.  Reflex Scores:      Patellar reflexes are 2+ on the right side and 2+ on the left side.      Achilles reflexes are 2+ on the right side and 2+ on the left side. Skin: Skin is warm and dry.  Psychiatric: She has a normal mood and affect.    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 No appreciable impingement noted at the L4-5  or L5-S1 postoperative levels.    MDM   Previous medical charts, nursing notes and vitals signs from this visit were reviewed by me   All laboratory results and/or imaging results performed on this visit, if applicable, were reviewed by me and discussed with the patient and/or parent as well as recommendation for follow-up    MEDICATIONS GIVEN IN ED:  IV Dilaudid and Zofran and flexeril po  Patient has ttp of the lumbar paraspinal muscles, left>right.   No focal neuro deficits on exam.  Ambulates with a steady gait.   Patient has history of chronic low back pain is managed by a pain management Center in [facility]. Patient has several recent ED visits for her low back pain.  I do not appreciate any focal neurological deficits on her exam and my clinical suspicion for an infectious process is low.     Patient is feeling better,   I have advised her that further management of her back pain needs to be handled by her pain management center.  Pt agrees to the care plan and verbalized understanding   PRESCRIPTIONS GIVEN AT DISCHARGE:  none   Pt stable in ED with no significant  deterioration in condition. Pt feels improved after observation and/or treatment in ED. Patient / Family / Caregiver understand and agree with initial ED impression and plan with expectations set for ED visit.  Patient agrees to return to ED for any worsening symptoms         Angeni Chaudhuri L. [place], [place] 06/06/11 [phone]

## 2011-06-06 NOTE — Discharge Instructions (Signed)
Chronic Back Pain When back pain lasts longer than 3 months, it is called chronic back pain.This pain can be frustrating, but the cause of the pain is rarely dangerous.People with chronic back pain often go through certain periods that are more intense (flare-ups). CAUSES Chronic back pain can be caused by wear and tear (degeneration) on different structures in your back. These structures may include bones, ligaments, or discs. This degeneration may result in more pressure being placed on the nerves that travel to your legs and feet. This can lead to pain traveling from the low back down the back of the legs. When pain lasts longer than 3 months, it is not unusual for people to experience anxiety or depression. Anxiety and depression can also contribute to low back pain. TREATMENT  Establish a regular exercise plan. This is critical to improving your functional level.   Have a self-management plan for when you flare-up. Flare-ups rarely require a medical visit. Regular exercise will help reduce the intensity and frequency of your flare-ups.   Manage how you feel about your back pain and the rest of your life. Anxiety, depression, and feeling that you cannot alter your back pain have been shown to make back pain more intense and debilitating.   Medicines should never be your only treatment. They should be used along with other treatments to help you return to a more active lifestyle.   Procedures such as injections or surgery may be helpful but are rarely necessary. You may be able to get the same results with physical therapy or chiropractic care.  HOME CARE INSTRUCTIONS  Avoid bending, heavy lifting, prolonged sitting, and activities which make the problem worse.   Continue normal activity as much as possible.   Take brief periods of rest throughout the day to reduce your pain during flare-ups.   Follow your back exercise rehabilitation program. This can help reduce symptoms and prevent  more pain.   Only take over-the-counter or prescription medicines as directed by your caregiver. Muscle relaxants are sometimes prescribed. Narcotic pain medicine is discouraged for long-term pain, since addiction is a possible outcome.   If you smoke, quit.   Eat healthy foods and maintain a recommended body weight.  SEEK IMMEDIATE MEDICAL CARE IF:   You have weakness or numbness in one of your legs or feet.   You have trouble controlling your bladder or bowels.   You develop nausea, vomiting, abdominal pain, shortness of breath, or fainting.  Document Released: 02/06/2004 Document Revised: 12/18/2010 Document Reviewed: 12/13/2010 [employer] Patient Information 2012 [employer].Chronic Pain Management Managing chronic pain is not easy. The goal is to provide as much pain relief as possible. There are emotional as well as physical problems. Chronic pain may lead to symptoms of depression which magnify those of the pain. Problems may include:  Anxiety.   Sleep disturbances.   Confused thinking.   Feeling cranky.   Fatigue.   Weight gain or loss.  Identify the source of the pain first, if possible. The pain may be masking another problem. Try to find a pain management specialist or clinic. Work with a team to create a treatment plan for you. MEDICATIONS  May include narcotics or opioids. Larger than normal doses may be needed to control your pain.   Drugs for depression may help.   Over-the-counter medicines may help for some conditions. These drugs may be used along with others for better pain relief.   May be injected into sites such as the spine   and joints. Injections may have to be repeated if they wear off.  THERAPY MAY INCLUDE:  Working with a physical therapist to keep from getting stiff.   Regular, gentle exercise.   Cognitive or behavioral therapy.   Using complementary or integrative medicine such as:   Acupuncture.   Massage, Reiki, or Rolfing.    Aroma, color, light, or sound therapy.   Group support.  FOR MORE INFORMATION http://www.painfoundation.org. American Chronic Pain Association http://www.thealpa.org. Document Released: 02/06/2004 Document Revised: 12/18/2010 Document Reviewed: 03/17/2007 [employer] Patient Information 2012 [employer]. 

## 2011-06-06 NOTE — ED Notes (Signed)
Tammy Triplett, PA at bedside. 

## 2011-06-06 NOTE — ED Notes (Signed)
Patient ambulatory to restroom with steady gait. NAD noted

## 2011-06-06 NOTE — ED Notes (Signed)
Patient with no complaints at this time. Respirations even and unlabored. Skin warm/dry. Discharge instructions reviewed with patient at this time. Patient given opportunity to voice concerns/ask questions. IV removed per policy and band-aid applied to site. Patient discharged at this time and left Emergency Department with steady gait.  

## 2011-06-06 NOTE — ED Notes (Addendum)
Patient with c/o chronic lower back pain. No known injury. States she was seen Thursday for same. Reports pain to legs and neck. Hx of back and neck surgery.

## 2011-06-07 ENCOUNTER — Emergency Department (HOSPITAL_COMMUNITY): Payer: Medicaid Other

## 2011-06-07 ENCOUNTER — Encounter (HOSPITAL_COMMUNITY): Payer: Self-pay | Admitting: Emergency Medicine

## 2011-06-07 ENCOUNTER — Emergency Department (HOSPITAL_COMMUNITY)
Admission: EM | Admit: 2011-06-07 | Discharge: 2011-06-07 | Disposition: A | Discharge status: Home / Self Care | Payer: Medicaid Other | Attending: Emergency Medicine | Admitting: Emergency Medicine

## 2011-06-07 DIAGNOSIS — F17200 Nicotine dependence, unspecified, uncomplicated: Secondary | ICD-10-CM | POA: Insufficient documentation

## 2011-06-07 DIAGNOSIS — R05 Cough: Secondary | ICD-10-CM | POA: Insufficient documentation

## 2011-06-07 DIAGNOSIS — R109 Unspecified abdominal pain: Secondary | ICD-10-CM | POA: Insufficient documentation

## 2011-06-07 DIAGNOSIS — M4120 Other idiopathic scoliosis, site unspecified: Secondary | ICD-10-CM | POA: Insufficient documentation

## 2011-06-07 DIAGNOSIS — R0602 Shortness of breath: Secondary | ICD-10-CM | POA: Insufficient documentation

## 2011-06-07 DIAGNOSIS — K219 Gastro-esophageal reflux disease without esophagitis: Secondary | ICD-10-CM | POA: Insufficient documentation

## 2011-06-07 DIAGNOSIS — R0789 Other chest pain: Secondary | ICD-10-CM | POA: Insufficient documentation

## 2011-06-07 DIAGNOSIS — R059 Cough, unspecified: Secondary | ICD-10-CM | POA: Insufficient documentation

## 2011-06-07 DIAGNOSIS — I10 Essential (primary) hypertension: Secondary | ICD-10-CM | POA: Insufficient documentation

## 2011-06-07 DIAGNOSIS — R062 Wheezing: Secondary | ICD-10-CM | POA: Insufficient documentation

## 2011-06-07 DIAGNOSIS — R10816 Epigastric abdominal tenderness: Secondary | ICD-10-CM | POA: Insufficient documentation

## 2011-06-07 LAB — CBC
HCT: 46.6 % — ABNORMAL HIGH (ref 36.0–46.0)
MCH: 33.9 pg (ref 26.0–34.0)
MCV: 92.8 fL (ref 78.0–100.0)
Platelets: 213 10*3/uL (ref 150–400)
RDW: 13.1 % (ref 11.5–15.5)
WBC: 15.6 10*3/uL — ABNORMAL HIGH (ref 4.0–10.5)

## 2011-06-07 LAB — COMPREHENSIVE METABOLIC PANEL
AST: 17 U/L (ref 0–37)
CO2: 24 mEq/L (ref 19–32)
Calcium: 9.7 mg/dL (ref 8.4–10.5)
Creatinine, Ser: 0.71 mg/dL (ref 0.50–1.10)
GFR calc Af Amer: >90 mL/min (ref >90)
GFR calc non Af Amer: >90 mL/min (ref >90)
Glucose, Bld: 141 mg/dL — ABNORMAL HIGH (ref 70–99)
Sodium: 138 mEq/L (ref 135–145)
Total Protein: 7.5 g/dL (ref 6.0–8.3)

## 2011-06-07 LAB — DIFFERENTIAL
Basophils Absolute: 0 10*3/uL (ref 0.0–0.1)
Eosinophils Absolute: 0 10*3/uL (ref 0.0–0.7)
Eosinophils Relative: 0 % (ref 0–5)
Lymphocytes Relative: 14 % (ref 12–46)
Monocytes Absolute: 1.3 10*3/uL — ABNORMAL HIGH (ref 0.1–1.0)

## 2011-06-07 MED ORDER — GI COCKTAIL ~~LOC~~
30.0000 mL | Freq: Once | ORAL | Status: AC
  Administered 2011-06-07: 30 mL via ORAL
  Filled 2011-06-07: qty 30

## 2011-06-07 MED ORDER — RANITIDINE HCL 150 MG PO CAPS: 150.0000 mg | ORAL_CAPSULE | Freq: Two times a day (BID) | ORAL | Status: DC

## 2011-06-07 NOTE — ED Notes (Signed)
CRITICAL VALUE ALERT  Critical value received:  K+ 2.7  Date of notification:  06/07/2011  Time of notification:  1335  Critical value read back:yes  Nurse who received alert:  Santiago Bur, RN  MD notified (1st page):  Dr. Estell Harpin  Time of first page:  1335  MD notified (2nd page):  Time of second page:  Responding MD:  Dr. Estell Harpin  Time MD responded:  [phone]

## 2011-06-07 NOTE — ED Provider Notes (Signed)
History   This chart was scribed for Danielle Lennert, MD by Clarita Crane. The patient was seen in room APA04/APA04. Patient's care was started at 1124.    CSN: [id number]  Arrival date & time 06/07/11  1124   First MD Initiated Contact with Patient 06/07/11 1142      Chief Complaint  Patient presents with  . Shortness of Breath    (Consider location/radiation/quality/duration/timing/severity/associated sxs/prior treatment) Patient is a 41 y.o. female presenting with shortness of breath. The history is provided by the patient.  Shortness of Breath  The current episode started yesterday. The problem occurs continuously. The problem has been unchanged. The problem is moderate. Relieved by: breathing treatment. The symptoms are aggravated by nothing. Associated symptoms include cough and shortness of breath. Pertinent negatives include no chest pain, no fever, no rhinorrhea and no sore throat.   [name] is a 41 y.o. female who presents to the Emergency Department complaining of constant moderate SOB onset last night with associated coughing, chest tightness and abdominal pain described as soreness. Patient reports SOB improved mildly with administration of breathing treatment in ED but chest tightness persists. Patient with h/o HTN, scoliosis, back pain, panic attack, back surgery, c-section, neck surgery, tubal ligation and is a current smoker.   Past Medical History  Diagnosis Date  . Scoliosis   . Hypertension   . Panic attack   . Back pain     Past Surgical History  Procedure Date  . Back surgery   . Cesarean section   . Neck surgery   . Tubal ligation     History reviewed. No pertinent family history.  History  Substance Use Topics  . Smoking status: Current Everyday Smoker -- 2.0 packs/day    Types: Cigarettes  . Smokeless tobacco: Not on file  . Alcohol Use: No    OB History    Grav Para Term Preterm Abortions TAB SAB Ect Mult Living                   Review of Systems  Constitutional: Negative for fever.  HENT: Negative for sore throat and rhinorrhea.   Eyes: Negative for pain.  Respiratory: Positive for cough, chest tightness and shortness of breath.   Cardiovascular: Negative for chest pain.  Gastrointestinal: Positive for abdominal pain. Negative for nausea, vomiting and diarrhea.  Genitourinary: Negative for dysuria.  Musculoskeletal: Negative for back pain.  Skin: Negative for rash.  Neurological: Negative for weakness and headaches.    Allergies  Penicillins; Ibuprofen; and Aspirin  Home Medications   Current Outpatient Rx  Name Route Sig Dispense Refill  . ALBUTEROL SULFATE HFA 108 (90 BASE) MCG/ACT IN AERS Inhalation Inhale 2 puffs into the lungs every 6 (six) hours as needed. For shortness of breath    . ALPRAZOLAM 1 MG PO TABS Oral Take 2 mg by mouth 3 (three) times daily.     Marland Kitchen CITALOPRAM HYDROBROMIDE 20 MG PO TABS Oral Take 20 mg by mouth every evening.     Marland Kitchen GABAPENTIN 300 MG PO CAPS Oral Take 300 mg by mouth at bedtime.      . MELOXICAM 15 MG PO TABS Oral Take 15 mg by mouth every evening.    Marland Kitchen METHOCARBAMOL 750 MG PO TABS Oral Take 750 mg by mouth 4 (four) times daily.      . OXYCODONE-ACETAMINOPHEN 10-325 MG PO TABS Oral Take 1 tablet by mouth 4 (four) times daily as needed. For pain    .  PANTOPRAZOLE SODIUM 40 MG PO TBEC Oral Take 40 mg by mouth daily.    Marland Kitchen TEMAZEPAM 30 MG PO CAPS Oral Take 60 mg by mouth at bedtime as needed. For sleep      BP 139/61  Pulse 78  Temp 98.5 F (36.9 C)  Resp 18  Ht 5\' 2"  (1.575 m)  Wt 170 lb (77.111 kg)  BMI 31.09 kg/m2  SpO2 100%  LMP 05/25/2011  Physical Exam  Nursing note and vitals reviewed. Constitutional: She is oriented to person, place, and time. She appears well-developed and well-nourished. No distress.  HENT:  Head: Normocephalic and atraumatic.  Mouth/Throat: Oropharynx is clear and moist.  Eyes: EOM are normal. Pupils are equal, round, and reactive  to light.  Neck: Neck supple. No tracheal deviation present.  Cardiovascular: Normal rate and regular rhythm.  Exam reveals no gallop and no friction rub.   No murmur heard. Pulmonary/Chest: Effort normal. No respiratory distress. She has wheezes (minimal bilaterally). She has no rales.  Abdominal: Soft. She exhibits no distension. There is tenderness in the epigastric area.  Musculoskeletal: Normal range of motion. She exhibits no edema.  Neurological: She is alert and oriented to person, place, and time. No sensory deficit.  Skin: Skin is warm and dry.  Psychiatric: She has a normal mood and affect. Her behavior is normal.    ED Course  Procedures (including critical care time)  DIAGNOSTIC STUDIES: Oxygen Saturation is 100% on room air, normal by my interpretation.    COORDINATION OF CARE: 11:52AM-Patient informed of current plan for treatment and evaluation and agrees with plan at this time.     Labs Reviewed  CBC - Abnormal; Notable for the following:    WBC 15.6 (*)    Hemoglobin 17.0 (*)    HCT 46.6 (*)    MCHC 36.5 (*)    All other components within normal limits  DIFFERENTIAL - Abnormal; Notable for the following:    Neutro Abs 12.1 (*)    Monocytes Absolute 1.3 (*)    All other components within normal limits  COMPREHENSIVE METABOLIC PANEL - Abnormal; Notable for the following:    Potassium 2.7 (*)    Glucose, Bld 141 (*)    All other components within normal limits  TROPONIN I   Dg Chest 2 View  06/07/2011  *RADIOLOGY REPORT*  Clinical Data: Shortness of breath.  CHEST - 2 VIEW  Comparison: 02/17/2010  Findings: Cardiomediastinal silhouette is within normal limits. The lungs are free of focal consolidations and pleural effusions. There is mild prominence of interstitial markings which appears stable.  No evidence for pulmonary edema. Visualized osseous structures have a normal appearance.  IMPRESSION: No evidence for acute cardiopulmonary abnormality.  Original  Report Authenticated By: Patterson Hammersmith, M.D.     No diagnosis found.   Date: 06/07/2011  Rate: 80  Rhythm: normal sinus rhythm  QRS Axis: normal  Intervals: normal  ST/T Wave abnormalities: nonspecific ST changes  Conduction Disutrbances:none  Narrative Interpretation:   Old EKG Reviewed: none available    MDM    The chart was scribed for me under my direct supervision.  I personally performed the history, physical, and medical decision making and all procedures in the evaluation of this patient.Danielle Lennert, MD 06/07/11 [phone]

## 2011-06-07 NOTE — Discharge Instructions (Signed)
Follow up if not improving

## 2011-06-07 NOTE — ED Notes (Addendum)
Pt c/o sob and chest tightness since yesterday, worsening throughout the night. Pt seen in ed yesterday for back pain. Pt receiving duo neb upon arrival to ed.

## 2011-09-11 ENCOUNTER — Emergency Department (HOSPITAL_COMMUNITY)
Admission: EM | Admit: 2011-09-11 | Discharge: 2011-09-11 | Discharge status: Against Medical Advice | Payer: Medicaid Other | Attending: Emergency Medicine | Admitting: Emergency Medicine

## 2011-09-11 ENCOUNTER — Encounter (HOSPITAL_COMMUNITY): Payer: Self-pay | Admitting: *Deleted

## 2011-09-11 DIAGNOSIS — R110 Nausea: Secondary | ICD-10-CM | POA: Insufficient documentation

## 2011-09-11 DIAGNOSIS — R109 Unspecified abdominal pain: Secondary | ICD-10-CM | POA: Insufficient documentation

## 2011-09-11 NOTE — ED Notes (Signed)
No answer

## 2011-09-11 NOTE — ED Notes (Signed)
After triage, pt said she had to take something to her car, Unable to locate her now.

## 2011-09-11 NOTE — ED Notes (Signed)
abd pain, burning, nausea, no vomiting,

## 2011-10-27 ENCOUNTER — Encounter (HOSPITAL_COMMUNITY): Payer: Self-pay | Admitting: *Deleted

## 2011-10-27 ENCOUNTER — Emergency Department (HOSPITAL_COMMUNITY)
Admission: EM | Admit: 2011-10-27 | Discharge: 2011-10-27 | Disposition: A | Discharge status: Home / Self Care | Payer: Medicaid Other | Attending: Emergency Medicine | Admitting: Emergency Medicine

## 2011-10-27 DIAGNOSIS — M546 Pain in thoracic spine: Secondary | ICD-10-CM | POA: Insufficient documentation

## 2011-10-27 DIAGNOSIS — Z880 Allergy status to penicillin: Secondary | ICD-10-CM | POA: Insufficient documentation

## 2011-10-27 DIAGNOSIS — I10 Essential (primary) hypertension: Secondary | ICD-10-CM | POA: Insufficient documentation

## 2011-10-27 DIAGNOSIS — F410 Panic disorder [episodic paroxysmal anxiety] without agoraphobia: Secondary | ICD-10-CM | POA: Insufficient documentation

## 2011-10-27 DIAGNOSIS — F17200 Nicotine dependence, unspecified, uncomplicated: Secondary | ICD-10-CM | POA: Insufficient documentation

## 2011-10-27 DIAGNOSIS — G8929 Other chronic pain: Secondary | ICD-10-CM | POA: Insufficient documentation

## 2011-10-27 DIAGNOSIS — R000 Tachycardia, unspecified: Secondary | ICD-10-CM | POA: Insufficient documentation

## 2011-10-27 DIAGNOSIS — M4120 Other idiopathic scoliosis, site unspecified: Secondary | ICD-10-CM | POA: Insufficient documentation

## 2011-10-27 MED ORDER — ONDANSETRON HCL 4 MG PO TABS
4.0000 mg | ORAL_TABLET | Freq: Once | ORAL | Status: AC
  Administered 2011-10-27: 4 mg via ORAL
  Filled 2011-10-27: qty 1

## 2011-10-27 MED ORDER — DIAZEPAM 5 MG PO TABS
5.0000 mg | ORAL_TABLET | Freq: Once | ORAL | Status: AC
  Administered 2011-10-27: 5 mg via ORAL
  Filled 2011-10-27: qty 1

## 2011-10-27 MED ORDER — DEXAMETHASONE SODIUM PHOSPHATE 4 MG/ML IJ SOLN
8.0000 mg | Freq: Once | INTRAMUSCULAR | Status: AC
  Administered 2011-10-27: 8 mg via INTRAMUSCULAR
  Filled 2011-10-27: qty 2

## 2011-10-27 MED ORDER — MORPHINE SULFATE 4 MG/ML IJ SOLN
8.0000 mg | Freq: Once | INTRAMUSCULAR | Status: AC
  Administered 2011-10-27: 8 mg via INTRAMUSCULAR
  Filled 2011-10-27: qty 2

## 2011-10-27 NOTE — ED Notes (Signed)
Back pain onset yesterday

## 2011-10-27 NOTE — ED Notes (Signed)
Back pain since recent move, worse for last 2 days.  Low back pain with radiation down both legs.  Alert. Resting quietly.

## 2011-10-27 NOTE — ED Provider Notes (Signed)
History     CSN: [id number]  Arrival date & time 10/27/11  1940   First MD Initiated Contact with Patient 10/27/11 2030      Chief Complaint  Patient presents with  . Back Pain    (Consider location/radiation/quality/duration/timing/severity/associated sxs/prior treatment) Patient is a 41 y.o. female presenting with back pain. The history is provided by the patient.  Back Pain  The current episode started yesterday. The problem has not changed since onset.The pain is associated with lifting heavy objects. The pain is present in the thoracic spine. The quality of the pain is described as burning. The pain is severe. The symptoms are aggravated by certain positions. The pain is the same all the time. Pertinent negatives include no chest pain, no abdominal pain and no dysuria. Treatments tried: percocet.    Past Medical History  Diagnosis Date  . Scoliosis   . Hypertension   . Panic attack   . Back pain     Past Surgical History  Procedure Date  . Back surgery   . Cesarean section   . Neck surgery   . Tubal ligation     No family history on file.  History  Substance Use Topics  . Smoking status: Current Every Day Smoker -- 2.0 packs/day    Types: Cigarettes  . Smokeless tobacco: Not on file  . Alcohol Use: No    OB History    Grav Para Term Preterm Abortions TAB SAB Ect Mult Living                  Review of Systems  Constitutional: Negative for activity change.       All ROS Neg except as noted in HPI  HENT: Negative for nosebleeds and neck pain.   Eyes: Negative for photophobia and discharge.  Respiratory: Negative for cough, shortness of breath and wheezing.   Cardiovascular: Negative for chest pain and palpitations.  Gastrointestinal: Negative for abdominal pain and blood in stool.  Genitourinary: Negative for dysuria, frequency and hematuria.  Musculoskeletal: Positive for back pain. Negative for arthralgias.  Skin: Negative.   Neurological: Negative  for dizziness, seizures and speech difficulty.  Psychiatric/Behavioral: Negative for hallucinations and confusion.    Allergies  Penicillins; Ibuprofen; and Aspirin  Home Medications   Current Outpatient Rx  Name Route Sig Dispense Refill  . ALBUTEROL SULFATE HFA 108 (90 BASE) MCG/ACT IN AERS Inhalation Inhale 2 puffs into the lungs every 6 (six) hours as needed. For shortness of breath    . ALPRAZOLAM 1 MG PO TABS Oral Take 2 mg by mouth 3 (three) times daily.     Marland Kitchen CITALOPRAM HYDROBROMIDE 20 MG PO TABS Oral Take 20 mg by mouth every evening.     Marland Kitchen GABAPENTIN 300 MG PO CAPS Oral Take 300 mg by mouth at bedtime.      . MELOXICAM 15 MG PO TABS Oral Take 15 mg by mouth every evening.    Marland Kitchen METHOCARBAMOL 750 MG PO TABS Oral Take 750 mg by mouth 4 (four) times daily.      . OXYCODONE-ACETAMINOPHEN 10-325 MG PO TABS Oral Take 1 tablet by mouth 4 (four) times daily as needed. For pain    . PANTOPRAZOLE SODIUM 40 MG PO TBEC Oral Take 40 mg by mouth daily.    Marland Kitchen RANITIDINE HCL 150 MG PO CAPS Oral Take 1 capsule (150 mg total) by mouth 2 (two) times daily. 60 capsule 0  . TEMAZEPAM 30 MG PO CAPS Oral Take 60  mg by mouth at bedtime as needed. For sleep      BP 159/101  Pulse 118  Temp 98.4 F (36.9 C) (Oral)  Resp 20  Ht 5\' 2"  (1.575 m)  Wt 170 lb (77.111 kg)  BMI 31.09 kg/m2  SpO2 97%  LMP 10/23/2011  Physical Exam  Nursing note and vitals reviewed. Constitutional: She is oriented to person, place, and time. She appears well-developed and well-nourished.  Non-toxic appearance.  HENT:  Head: Normocephalic.  Right Ear: Tympanic membrane and external ear normal.  Left Ear: Tympanic membrane and external ear normal.  Eyes: EOM and lids are normal. Pupils are equal, round, and reactive to light.  Neck: Normal range of motion. Neck supple. Carotid bruit is not present.  Cardiovascular: Regular rhythm, normal heart sounds, intact distal pulses and normal pulses.  Tachycardia present.     Pulmonary/Chest: Breath sounds normal. No respiratory distress.  Abdominal: Soft. Bowel sounds are normal. There is no tenderness. There is no guarding.  Musculoskeletal: Normal range of motion.       . There is paraspinal area tenderness at the lumbar area to palpation and with change of position. There no hot areas appreciated. There is pain at the lower thoracic area and the entire lumbar area to palpation.  Lymphadenopathy:       Head (right side): No submandibular adenopathy present.       Head (left side): No submandibular adenopathy present.    She has no cervical adenopathy.  Neurological: She is alert and oriented to person, place, and time. She has normal strength. No cranial nerve deficit or sensory deficit.  Skin: Skin is warm and dry.  Psychiatric: She has a normal mood and affect. Her speech is normal.    ED Course  Procedures (including critical care time)  Labs Reviewed - No data to display No results found.   No diagnosis found.    MDM  I have reviewed nursing notes, vital signs, and all appropriate lab and imaging results for this patient. Patient has a history of chronic back pain. She states she's had multiple back related surgeries. She continues to have ongoing back problems. She is seen by a pain management specialist, but states that the Percocet that she is taking is not helping. The patient also states that she has been moving this week and thinks she may have upset her back. The patient was treated with intramuscular morphine and dexamethasone. The patient was also treated with oral Valium. The patient will continue her current medications and follow up with her pain management specialist.       Kathie Dike, PA 10/27/11 2135

## 2011-10-28 NOTE — ED Provider Notes (Signed)
Medical screening examination/treatment/procedure(s) were performed by non-physician practitioner and as supervising physician I was immediately available for consultation/collaboration.   Shondell Poulson L Nickalos Petersen, MD 10/28/11 0007 

## 2011-10-29 ENCOUNTER — Emergency Department (HOSPITAL_COMMUNITY)
Admission: EM | Admit: 2011-10-29 | Discharge: 2011-10-29 | Disposition: A | Discharge status: Home / Self Care | Payer: Medicaid Other | Attending: Emergency Medicine | Admitting: Emergency Medicine

## 2011-10-29 ENCOUNTER — Encounter (HOSPITAL_COMMUNITY): Payer: Self-pay | Admitting: *Deleted

## 2011-10-29 DIAGNOSIS — M79609 Pain in unspecified limb: Secondary | ICD-10-CM | POA: Insufficient documentation

## 2011-10-29 DIAGNOSIS — Z79899 Other long term (current) drug therapy: Secondary | ICD-10-CM | POA: Insufficient documentation

## 2011-10-29 DIAGNOSIS — M549 Dorsalgia, unspecified: Secondary | ICD-10-CM

## 2011-10-29 DIAGNOSIS — F411 Generalized anxiety disorder: Secondary | ICD-10-CM | POA: Insufficient documentation

## 2011-10-29 DIAGNOSIS — F17200 Nicotine dependence, unspecified, uncomplicated: Secondary | ICD-10-CM | POA: Insufficient documentation

## 2011-10-29 DIAGNOSIS — M4120 Other idiopathic scoliosis, site unspecified: Secondary | ICD-10-CM | POA: Insufficient documentation

## 2011-10-29 DIAGNOSIS — M545 Low back pain, unspecified: Secondary | ICD-10-CM | POA: Insufficient documentation

## 2011-10-29 DIAGNOSIS — I10 Essential (primary) hypertension: Secondary | ICD-10-CM | POA: Insufficient documentation

## 2011-10-29 DIAGNOSIS — F329 Major depressive disorder, single episode, unspecified: Secondary | ICD-10-CM | POA: Insufficient documentation

## 2011-10-29 DIAGNOSIS — G8929 Other chronic pain: Secondary | ICD-10-CM | POA: Insufficient documentation

## 2011-10-29 DIAGNOSIS — F3289 Other specified depressive episodes: Secondary | ICD-10-CM | POA: Insufficient documentation

## 2011-10-29 HISTORY — DX: Major depressive disorder, single episode, unspecified: F32.9

## 2011-10-29 HISTORY — DX: Anxiety disorder, unspecified: F41.9

## 2011-10-29 HISTORY — DX: Depression, unspecified: F32.A

## 2011-10-29 MED ORDER — MORPHINE SULFATE 4 MG/ML IJ SOLN
8.0000 mg | Freq: Once | INTRAMUSCULAR | Status: AC
  Administered 2011-10-29: 8 mg via INTRAMUSCULAR
  Filled 2011-10-29: qty 2

## 2011-10-29 MED ORDER — DIAZEPAM 5 MG PO TABS
5.0000 mg | ORAL_TABLET | Freq: Once | ORAL | Status: AC
  Administered 2011-10-29: 5 mg via ORAL
  Filled 2011-10-29: qty 1

## 2011-10-29 MED ORDER — ONDANSETRON 8 MG PO TBDP
8.0000 mg | ORAL_TABLET | Freq: Once | ORAL | Status: AC
  Administered 2011-10-29: 8 mg via ORAL
  Filled 2011-10-29: qty 1

## 2011-10-29 NOTE — ED Notes (Signed)
Pt states she was moving and hurt her back last week. Hx of pinched nerve in back per pt.

## 2011-10-29 NOTE — ED Provider Notes (Signed)
History     CSN: [id number]  Arrival date & time 10/29/11  1501   First MD Initiated Contact with Patient 10/29/11 1521      Chief Complaint  Patient presents with  . Back Pain    (Consider location/radiation/quality/duration/timing/severity/associated sxs/prior treatment) HPI Comments: Patient with hx of chronic low back pain c/o increased pain to her lower back for one week.  States that she has recently moved and was lifting heavy boxes.  She currently takes Percocet for her chronic pain and is under a contract with her pain management clinic.  She denies new symptoms.  She also denies numbness, weakness, incontinence, saddle anesthesia's.    Patient is a 41 y.o. female presenting with back pain. The history is provided by the patient.  Back Pain  This is a chronic problem. The current episode started more than 1 week ago. The problem occurs constantly. The problem has not changed since onset.The pain is associated with lifting heavy objects and twisting. The pain is present in the lumbar spine, sacro-iliac joint and gluteal region. The quality of the pain is described as aching. The pain radiates to the left thigh and left knee. The pain is moderate. The symptoms are aggravated by bending, twisting and certain positions. The pain is the same all the time. Associated symptoms include leg pain. Pertinent negatives include no chest pain, no fever, no numbness, no abdominal pain, no abdominal swelling, no bowel incontinence, no perianal numbness, no bladder incontinence, no dysuria, no pelvic pain, no paresthesias, no paresis, no tingling and no weakness. She has tried analgesics for the symptoms. The treatment provided no relief.    Past Medical History  Diagnosis Date  . Scoliosis   . Hypertension   . Panic attack   . Back pain   . Depression   . Anxiety     Past Surgical History  Procedure Date  . Back surgery   . Cesarean section   . Neck surgery   . Tubal ligation     No  family history on file.  History  Substance Use Topics  . Smoking status: Current Every Day Smoker -- 2.0 packs/day    Types: Cigarettes  . Smokeless tobacco: Not on file  . Alcohol Use: No    OB History    Grav Para Term Preterm Abortions TAB SAB Ect Mult Living                  Review of Systems  Constitutional: Negative for fever.  Respiratory: Negative for shortness of breath.   Cardiovascular: Negative for chest pain.  Gastrointestinal: Negative for vomiting, abdominal pain, constipation and bowel incontinence.  Genitourinary: Negative for bladder incontinence, dysuria, hematuria, flank pain, decreased urine volume, difficulty urinating and pelvic pain.       No perineal numbness or incontinence of urine or feces  Musculoskeletal: Positive for back pain. Negative for joint swelling.  Skin: Negative for rash.  Neurological: Negative for tingling, weakness, numbness and paresthesias.  All other systems reviewed and are negative.    Allergies  Penicillins; Ibuprofen; and Aspirin  Home Medications   Current Outpatient Rx  Name Route Sig Dispense Refill  . LISINOPRIL-HYDROCHLOROTHIAZIDE 20-25 MG PO TABS Oral Take 1 tablet by mouth daily.    . ALBUTEROL SULFATE HFA 108 (90 BASE) MCG/ACT IN AERS Inhalation Inhale 2 puffs into the lungs every 6 (six) hours as needed. For shortness of breath    . ALPRAZOLAM 1 MG PO TABS Oral Take 2 mg  by mouth 3 (three) times daily.     Marland Kitchen CITALOPRAM HYDROBROMIDE 20 MG PO TABS Oral Take 20 mg by mouth every evening.     Marland Kitchen GABAPENTIN 300 MG PO CAPS Oral Take 300 mg by mouth at bedtime.      . MELOXICAM 15 MG PO TABS Oral Take 15 mg by mouth every evening.    Marland Kitchen METHOCARBAMOL 750 MG PO TABS Oral Take 750 mg by mouth 4 (four) times daily.      . OXYCODONE-ACETAMINOPHEN 10-325 MG PO TABS Oral Take 1 tablet by mouth 4 (four) times daily as needed. For pain    . PANTOPRAZOLE SODIUM 40 MG PO TBEC Oral Take 40 mg by mouth daily.    Marland Kitchen RANITIDINE HCL  150 MG PO CAPS Oral Take 1 capsule (150 mg total) by mouth 2 (two) times daily. 60 capsule 0  . TEMAZEPAM 30 MG PO CAPS Oral Take 60 mg by mouth at bedtime as needed. For sleep      BP 150/94  Pulse 78  Temp 98.5 F (36.9 C) (Oral)  Resp 16  Ht 5\' 2"  (1.575 m)  Wt 203 lb (92.08 kg)  BMI 37.13 kg/m2  SpO2 98%  LMP 10/23/2011  Physical Exam  Nursing note and vitals reviewed. Constitutional: She is oriented to person, place, and time. She appears well-developed and well-nourished. No distress.  HENT:  Head: Normocephalic and atraumatic.  Neck: Normal range of motion. Neck supple.  Cardiovascular: Normal rate, regular rhythm, normal heart sounds and intact distal pulses.   No murmur heard. Pulmonary/Chest: Effort normal and breath sounds normal.  Musculoskeletal: She exhibits tenderness. She exhibits no edema.       Lumbar back: She exhibits tenderness and pain. She exhibits normal range of motion, no swelling, no deformity, no laceration and normal pulse.       Back:  Neurological: She is alert and oriented to person, place, and time. No sensory deficit. She exhibits normal muscle tone. Coordination and gait normal.  Reflex Scores:      Patellar reflexes are 2+ on the right side and 2+ on the left side.      Achilles reflexes are 2+ on the right side and 2+ on the left side. Skin: Skin is warm and dry.    ED Course  Procedures (including critical care time)  Labs Reviewed - No data to display    Patient given IM morphine, po valium and ODT zofran.    MDM    Patient has ttp of the lumbar paraspinal muscles.  No focal neuro deficits on exam.  Ambulates with a steady gait.      MRI L spine from 04/23/11 was reviewed by me. 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 No appreciable impingement noted at the L4-5 or L5-S1 postoperative levels.  I doubt emergent neurological process.  Pt agrees to close f/u  with her PMD  No Rx's given   [name] L. [place], [place] 10/29/11 1634

## 2011-10-29 NOTE — ED Notes (Signed)
Seen here recently for back pain. T Triplett PA in to see pt.

## 2011-10-30 ENCOUNTER — Encounter (HOSPITAL_COMMUNITY): Payer: Self-pay | Admitting: *Deleted

## 2011-10-30 ENCOUNTER — Emergency Department (HOSPITAL_COMMUNITY)
Admission: EM | Admit: 2011-10-30 | Discharge: 2011-10-30 | Disposition: A | Discharge status: Home / Self Care | Payer: Medicaid Other | Attending: Emergency Medicine | Admitting: Emergency Medicine

## 2011-10-30 DIAGNOSIS — M4120 Other idiopathic scoliosis, site unspecified: Secondary | ICD-10-CM | POA: Insufficient documentation

## 2011-10-30 DIAGNOSIS — I10 Essential (primary) hypertension: Secondary | ICD-10-CM | POA: Insufficient documentation

## 2011-10-30 DIAGNOSIS — M549 Dorsalgia, unspecified: Secondary | ICD-10-CM

## 2011-10-30 DIAGNOSIS — F411 Generalized anxiety disorder: Secondary | ICD-10-CM | POA: Insufficient documentation

## 2011-10-30 DIAGNOSIS — F17200 Nicotine dependence, unspecified, uncomplicated: Secondary | ICD-10-CM | POA: Insufficient documentation

## 2011-10-30 DIAGNOSIS — M546 Pain in thoracic spine: Secondary | ICD-10-CM | POA: Insufficient documentation

## 2011-10-30 MED ORDER — PROMETHAZINE HCL 12.5 MG PO TABS
12.5000 mg | ORAL_TABLET | Freq: Once | ORAL | Status: AC
  Administered 2011-10-30: 12.5 mg via ORAL
  Filled 2011-10-30: qty 1

## 2011-10-30 MED ORDER — OXYCODONE-ACETAMINOPHEN 5-325 MG PO TABS
1.0000 | ORAL_TABLET | Freq: Once | ORAL | Status: AC
  Administered 2011-10-30: 1 via ORAL
  Filled 2011-10-30: qty 1

## 2011-10-30 MED ORDER — OXYCODONE-ACETAMINOPHEN 5-325 MG PO TABS: 1.0000 | ORAL_TABLET | Freq: Four times a day (QID) | ORAL | Status: DC | PRN

## 2011-10-30 NOTE — ED Notes (Signed)
Patient with no complaints at this time. Respirations even and unlabored. Skin warm/dry. Discharge instructions reviewed with patient at this time. Patient given opportunity to voice concerns/ask questions. Patient discharged at this time and left Emergency Department with steady gait.   

## 2011-10-30 NOTE — ED Notes (Signed)
Neck and back pain for 1 week,  Thinks her bp is up too.

## 2011-10-30 NOTE — ED Provider Notes (Signed)
History     CSN: [id number]  Arrival date & time 10/30/11  1434   First MD Initiated Contact with Patient 10/30/11 1551      No chief complaint on file.   (Consider location/radiation/quality/duration/timing/severity/associated sxs/prior treatment) HPI Comments: Patient presents to the emergency department with neck pain and upper back pain. Patient states this is been going on for a week. She denies any recent injury or trauma. The patient states this has been ongoing since surgery on her neck some years back. The patient states that her pain has been worse over the last 2 days. She took her last Percocet this morning approximately 3 AM and the pain has been getting progressively worse since then. The patient took a BC powder during the day today but this did not help the pain and actually hurt her stomach. The patient denies dropping objects, but states she has numbness and tingling particularly goes down her left arm. Patient presents for assistance with these problems.  The history is provided by the patient.    Past Medical History  Diagnosis Date  . Scoliosis   . Hypertension   . Panic attack   . Back pain   . Depression   . Anxiety     Past Surgical History  Procedure Date  . Back surgery   . Cesarean section   . Neck surgery   . Tubal ligation     History reviewed. No pertinent family history.  History  Substance Use Topics  . Smoking status: Current Every Day Smoker -- 2.0 packs/day    Types: Cigarettes  . Smokeless tobacco: Not on file  . Alcohol Use: No    OB History    Grav Para Term Preterm Abortions TAB SAB Ect Mult Living                  Review of Systems  Constitutional: Negative for activity change.       All ROS Neg except as noted in HPI  HENT: Negative for nosebleeds and neck pain.   Eyes: Negative for photophobia and discharge.  Respiratory: Negative for cough, shortness of breath and wheezing.   Cardiovascular: Negative for chest pain  and palpitations.  Gastrointestinal: Negative for abdominal pain and blood in stool.  Genitourinary: Negative for dysuria, frequency and hematuria.  Musculoskeletal: Positive for back pain and arthralgias.  Skin: Negative.   Neurological: Negative for dizziness, seizures and speech difficulty.  Psychiatric/Behavioral: Negative for hallucinations and confusion. The patient is nervous/anxious.        Depression    Allergies  Penicillins; Ibuprofen; and Aspirin  Home Medications   Current Outpatient Rx  Name Route Sig Dispense Refill  . ALBUTEROL SULFATE HFA 108 (90 BASE) MCG/ACT IN AERS Inhalation Inhale 2 puffs into the lungs every 6 (six) hours as needed. For shortness of breath    . ALPRAZOLAM 1 MG PO TABS Oral Take 2 mg by mouth 3 (three) times daily.     Marland Kitchen CITALOPRAM HYDROBROMIDE 20 MG PO TABS Oral Take 20 mg by mouth every evening.     Marland Kitchen GABAPENTIN 300 MG PO CAPS Oral Take 300 mg by mouth at bedtime.      Marland Kitchen LISINOPRIL-HYDROCHLOROTHIAZIDE 20-25 MG PO TABS Oral Take 1 tablet by mouth daily.    . MELOXICAM 15 MG PO TABS Oral Take 15 mg by mouth every evening.    Marland Kitchen METHOCARBAMOL 750 MG PO TABS Oral Take 750 mg by mouth 4 (four) times daily.      Marland Kitchen  OXYCODONE-ACETAMINOPHEN 10-325 MG PO TABS Oral Take 1 tablet by mouth 4 (four) times daily as needed. For pain    . OXYCODONE-ACETAMINOPHEN 5-325 MG PO TABS Oral Take 1 tablet by mouth every 6 (six) hours as needed for pain. 15 tablet 0  . PANTOPRAZOLE SODIUM 40 MG PO TBEC Oral Take 40 mg by mouth daily.    Marland Kitchen RANITIDINE HCL 150 MG PO CAPS Oral Take 1 capsule (150 mg total) by mouth 2 (two) times daily. 60 capsule 0  . TEMAZEPAM 30 MG PO CAPS Oral Take 60 mg by mouth at bedtime as needed. For sleep      BP 128/74  Pulse 76  Temp 98.5 F (36.9 C) (Oral)  Resp 18  Ht 5\' 2"  (1.575 m)  Wt 203 lb (92.08 kg)  BMI 37.13 kg/m2  SpO2 98%  LMP 10/23/2011  Physical Exam  Nursing note and vitals reviewed. Constitutional: She is oriented to  person, place, and time. She appears well-developed and well-nourished.  Non-toxic appearance.  HENT:  Head: Normocephalic.  Right Ear: Tympanic membrane and external ear normal.  Left Ear: Tympanic membrane and external ear normal.  Eyes: EOM and lids are normal. Pupils are equal, round, and reactive to light.  Neck: Normal range of motion. Neck supple. Carotid bruit is not present. No tracheal deviation present.       Patient has well-healed surgical scar of the posterior neck. There is no carotid bruits appreciated. There is no hot areas noted.  Cardiovascular: Normal rate, regular rhythm, normal heart sounds, intact distal pulses and normal pulses.   Pulmonary/Chest: Breath sounds normal. No stridor. No respiratory distress.  Abdominal: Soft. Bowel sounds are normal. There is no tenderness. There is no guarding.  Musculoskeletal: Normal range of motion.  Lymphadenopathy:       Head (right side): No submandibular adenopathy present.       Head (left side): No submandibular adenopathy present.    She has no cervical adenopathy.  Neurological: She is alert and oriented to person, place, and time. She has normal strength. No cranial nerve deficit or sensory deficit. She exhibits normal muscle tone. Coordination normal.  Skin: Skin is warm and dry.  Psychiatric: She has a normal mood and affect. Her speech is normal.    ED Course  Procedures (including critical care time)  Labs Reviewed - No data to display No results found.   1. Pain, upper back       MDM  I have reviewed nursing notes, vital signs, and all appropriate lab and imaging results for this patient. Patient has pain of the upper back extending into the neck. There no acute neurologic deficits appreciated. Patient also very upset over family situations. The patient is treated with Percocet one every 6 hours as needed for pain #15 tablets. Patient is advised to see her primary physician next week.       Kathie Dike, [place] 10/30/11 1659

## 2011-10-30 NOTE — ED Provider Notes (Signed)
Medical screening examination/treatment/procedure(s) were performed by non-physician practitioner and as supervising physician I was immediately available for consultation/collaboration.   Shelda Jakes, MD 10/30/11 1100

## 2011-10-31 NOTE — ED Provider Notes (Signed)
Medical screening examination/treatment/procedure(s) were performed by non-physician practitioner and as supervising physician I was immediately available for consultation/collaboration.   Danielle Brockman W. Chika Cichowski, MD 10/31/11 1240 

## 2011-11-26 ENCOUNTER — Encounter (HOSPITAL_COMMUNITY): Payer: Self-pay

## 2011-11-26 ENCOUNTER — Emergency Department (HOSPITAL_COMMUNITY): Payer: Medicaid Other

## 2011-11-26 ENCOUNTER — Inpatient Hospital Stay (HOSPITAL_COMMUNITY)
Admission: EM | Admit: 2011-11-26 | Discharge: 2011-11-28 | DRG: 419 | Disposition: A | Discharge status: Home / Self Care | Payer: Medicaid Other | Attending: General Surgery | Admitting: General Surgery

## 2011-11-26 DIAGNOSIS — K8000 Calculus of gallbladder with acute cholecystitis without obstruction: Principal | ICD-10-CM | POA: Diagnosis present

## 2011-11-26 DIAGNOSIS — F411 Generalized anxiety disorder: Secondary | ICD-10-CM | POA: Diagnosis present

## 2011-11-26 DIAGNOSIS — E669 Obesity, unspecified: Secondary | ICD-10-CM | POA: Diagnosis present

## 2011-11-26 DIAGNOSIS — I10 Essential (primary) hypertension: Secondary | ICD-10-CM | POA: Diagnosis present

## 2011-11-26 DIAGNOSIS — K819 Cholecystitis, unspecified: Secondary | ICD-10-CM

## 2011-11-26 DIAGNOSIS — M4120 Other idiopathic scoliosis, site unspecified: Secondary | ICD-10-CM | POA: Diagnosis present

## 2011-11-26 DIAGNOSIS — Z886 Allergy status to analgesic agent status: Secondary | ICD-10-CM

## 2011-11-26 DIAGNOSIS — Z6837 Body mass index (BMI) 37.0-37.9, adult: Secondary | ICD-10-CM

## 2011-11-26 DIAGNOSIS — F3289 Other specified depressive episodes: Secondary | ICD-10-CM | POA: Diagnosis present

## 2011-11-26 DIAGNOSIS — F17200 Nicotine dependence, unspecified, uncomplicated: Secondary | ICD-10-CM | POA: Diagnosis present

## 2011-11-26 DIAGNOSIS — Z79899 Other long term (current) drug therapy: Secondary | ICD-10-CM

## 2011-11-26 DIAGNOSIS — Z880 Allergy status to penicillin: Secondary | ICD-10-CM

## 2011-11-26 DIAGNOSIS — F329 Major depressive disorder, single episode, unspecified: Secondary | ICD-10-CM | POA: Diagnosis present

## 2011-11-26 HISTORY — DX: Reserved for concepts with insufficient information to code with codable children: IMO0002

## 2011-11-26 LAB — CBC WITH DIFFERENTIAL/PLATELET
Hemoglobin: 17.4 g/dL — ABNORMAL HIGH (ref 12.0–15.0)
Lymphocytes Relative: 8 % — ABNORMAL LOW (ref 12–46)
Lymphs Abs: 1.8 10*3/uL (ref 0.7–4.0)
Monocytes Relative: 4 % (ref 3–12)
Neutrophils Relative %: 88 % — ABNORMAL HIGH (ref 43–77)
Platelets: 293 10*3/uL (ref 150–400)
RBC: 4.98 MIL/uL (ref 3.87–5.11)
WBC: 22.8 10*3/uL — ABNORMAL HIGH (ref 4.0–10.5)

## 2011-11-26 LAB — URINALYSIS, ROUTINE W REFLEX MICROSCOPIC
Bilirubin Urine: NEGATIVE
Hgb urine dipstick: NEGATIVE
Ketones, ur: NEGATIVE mg/dL
Protein, ur: NEGATIVE mg/dL
Specific Gravity, Urine: >1.03 — ABNORMAL HIGH (ref 1.005–1.030)
Urobilinogen, UA: 0.2 mg/dL (ref 0.0–1.0)

## 2011-11-26 LAB — COMPREHENSIVE METABOLIC PANEL
ALT: 23 U/L (ref 0–35)
Alkaline Phosphatase: 147 U/L — ABNORMAL HIGH (ref 39–117)
CO2: 26 mEq/L (ref 19–32)
Chloride: 100 mEq/L (ref 96–112)
GFR calc Af Amer: >90 mL/min (ref >90)
GFR calc non Af Amer: 83 mL/min — ABNORMAL LOW (ref >90)
Glucose, Bld: 144 mg/dL — ABNORMAL HIGH (ref 70–99)
Potassium: 3.4 mEq/L — ABNORMAL LOW (ref 3.5–5.1)
Sodium: 137 mEq/L (ref 135–145)
Total Bilirubin: 0.3 mg/dL (ref 0.3–1.2)

## 2011-11-26 LAB — URINE MICROSCOPIC-ADD ON

## 2011-11-26 MED ORDER — HYDROMORPHONE HCL PF 1 MG/ML IJ SOLN
1.0000 mg | Freq: Once | INTRAMUSCULAR | Status: AC
  Administered 2011-11-26: 1 mg via INTRAVENOUS
  Filled 2011-11-26: qty 1

## 2011-11-26 MED ORDER — INFLUENZA VIRUS VACC SPLIT PF IM SUSP
0.5000 mL | INTRAMUSCULAR | Status: AC
  Filled 2011-11-26: qty 0.5

## 2011-11-26 MED ORDER — PANTOPRAZOLE SODIUM 40 MG IV SOLR
40.0000 mg | Freq: Every day | INTRAVENOUS | Status: DC
  Administered 2011-11-26: 40 mg via INTRAVENOUS
  Filled 2011-11-26: qty 40

## 2011-11-26 MED ORDER — METRONIDAZOLE IN NACL 5-0.79 MG/ML-% IV SOLN
INTRAVENOUS | Status: AC
  Filled 2011-11-26: qty 200

## 2011-11-26 MED ORDER — ENOXAPARIN SODIUM 40 MG/0.4ML ~~LOC~~ SOLN
40.0000 mg | SUBCUTANEOUS | Status: DC
  Administered 2011-11-26 – 2011-11-27 (×2): 40 mg via SUBCUTANEOUS
  Filled 2011-11-26 (×2): qty 0.4

## 2011-11-26 MED ORDER — LACTATED RINGERS IV SOLN
INTRAVENOUS | Status: DC
  Administered 2011-11-26 – 2011-11-27 (×2): via INTRAVENOUS
  Administered 2011-11-27: 1000 mL via INTRAVENOUS
  Administered 2011-11-27: 15:00:00 via INTRAVENOUS

## 2011-11-26 MED ORDER — HYDROMORPHONE HCL PF 1 MG/ML IJ SOLN: 1.0000 mg | INTRAMUSCULAR | Status: DC | PRN

## 2011-11-26 MED ORDER — SODIUM CHLORIDE 0.9 % IV BOLUS (SEPSIS)
1000.0000 mL | Freq: Once | INTRAVENOUS | Status: AC
  Administered 2011-11-26: 1000 mL via INTRAVENOUS

## 2011-11-26 MED ORDER — PNEUMOCOCCAL VAC POLYVALENT 25 MCG/0.5ML IJ INJ
0.5000 mL | INJECTION | INTRAMUSCULAR | Status: AC
  Filled 2011-11-26: qty 0.5

## 2011-11-26 MED ORDER — HYDROMORPHONE HCL PF 1 MG/ML IJ SOLN
1.0000 mg | INTRAMUSCULAR | Status: DC | PRN
  Administered 2011-11-26 – 2011-11-27 (×4): 2 mg via INTRAVENOUS
  Filled 2011-11-26 (×4): qty 2

## 2011-11-26 MED ORDER — ONDANSETRON HCL 4 MG/2ML IJ SOLN: 4.0000 mg | Freq: Four times a day (QID) | INTRAMUSCULAR | Status: DC | PRN

## 2011-11-26 MED ORDER — ONDANSETRON HCL 4 MG/2ML IJ SOLN: 4.0000 mg | Freq: Three times a day (TID) | INTRAMUSCULAR | Status: DC | PRN

## 2011-11-26 MED ORDER — LORAZEPAM 2 MG/ML IJ SOLN
1.0000 mg | Freq: Four times a day (QID) | INTRAMUSCULAR | Status: DC | PRN
  Administered 2011-11-26 – 2011-11-27 (×2): 1 mg via INTRAVENOUS
  Filled 2011-11-26 (×2): qty 1

## 2011-11-26 MED ORDER — DEXTROSE 5 % IV SOLN: 2.0000 g | Freq: Four times a day (QID) | INTRAVENOUS | Status: DC

## 2011-11-26 MED ORDER — LEVOFLOXACIN IN D5W 500 MG/100ML IV SOLN
500.0000 mg | INTRAVENOUS | Status: DC
  Administered 2011-11-26: 500 mg via INTRAVENOUS
  Filled 2011-11-26 (×4): qty 100

## 2011-11-26 MED ORDER — SODIUM CHLORIDE 0.45 % IV SOLN: INTRAVENOUS | Status: DC

## 2011-11-26 MED ORDER — METRONIDAZOLE IN NACL 5-0.79 MG/ML-% IV SOLN
500.0000 mg | Freq: Three times a day (TID) | INTRAVENOUS | Status: DC
  Administered 2011-11-27 (×2): 500 mg via INTRAVENOUS
  Filled 2011-11-26 (×11): qty 100

## 2011-11-26 MED ORDER — ONDANSETRON HCL 4 MG/2ML IJ SOLN
4.0000 mg | Freq: Once | INTRAMUSCULAR | Status: AC
  Administered 2011-11-26: 4 mg via INTRAVENOUS
  Filled 2011-11-26: qty 2

## 2011-11-26 MED ORDER — LEVOFLOXACIN IN D5W 500 MG/100ML IV SOLN
INTRAVENOUS | Status: AC
  Filled 2011-11-26: qty 100

## 2011-11-26 NOTE — ED Notes (Signed)
Patient requesting pain medication. States "they gave me 1 mg of Dilaudid and I need some more." Advised patient that it is too soon to receive another dose of dilaudid as per admission orders.

## 2011-11-26 NOTE — ED Notes (Signed)
Complain of abdominal pain that goes all over body

## 2011-11-26 NOTE — ED Provider Notes (Signed)
History     CSN: [id number]  Arrival date & time 11/26/11  1332   First MD Initiated Contact with Patient 11/26/11 1539      Chief Complaint  Patient presents with  . Abdominal Pain    (Consider location/radiation/quality/duration/timing/severity/associated sxs/prior treatment) HPI Patient complaining of ruq pain radiating up awoke at 0752 with sharp pain.  Patient states pain only, no other symptoms then felt with hand and felt knot and felt other side and didn't feel any knot.  Patient took usual percocet without relief.  Prescribed by Dr. Eather Colas for chronic neck pain.  Patient states she tried to watch tv with worsening pain.  Pain now 10.  Patient states ate red hot sausage yesterday and thought it might have irritated her ulcer.  Took a protonix.  Patient states vomited x 3 clear fluid.  Patient states sweaty.  No diarrhea, constipation.  PMD Dr. Sudie Bailey Past Medical History  Diagnosis Date  . Scoliosis   . Hypertension   . Panic attack   . Back pain   . Depression   . Anxiety   . Ulcer     Past Surgical History  Procedure Date  . Back surgery   . Cesarean section   . Neck surgery   . Tubal ligation     No family history on file.  History  Substance Use Topics  . Smoking status: Current Every Day Smoker -- 2.0 packs/day    Types: Cigarettes  . Smokeless tobacco: Not on file  . Alcohol Use: No    OB History    Grav Para Term Preterm Abortions TAB SAB Ect Mult Living                  Review of Systems  Constitutional: Negative for fever, chills and appetite change.  HENT: Negative for neck pain.   Eyes: Negative.   Respiratory: Negative for shortness of breath.   Gastrointestinal: Positive for nausea, vomiting and abdominal pain. Negative for diarrhea and constipation.  Genitourinary: Positive for vaginal bleeding. Negative for dysuria, frequency and flank pain.       Patient currently menstruating , normal period at normal time.  S/p bilateral tubal  ligation.  [id number]  Musculoskeletal: Positive for back pain.  Neurological: Negative.   Hematological: Negative.   Psychiatric/Behavioral: Negative.     Allergies  Penicillins; Ibuprofen; and Aspirin  Home Medications   Current Outpatient Rx  Name  Route  Sig  Dispense  Refill  . ALBUTEROL SULFATE HFA 108 (90 BASE) MCG/ACT IN AERS   Inhalation   Inhale 2 puffs into the lungs every 6 (six) hours as needed. For shortness of breath         . ALPRAZOLAM 1 MG PO TABS   Oral   Take 2 mg by mouth 3 (three) times daily.          Marland Kitchen CITALOPRAM HYDROBROMIDE 20 MG PO TABS   Oral   Take 20 mg by mouth every evening.          Marland Kitchen GABAPENTIN 300 MG PO CAPS   Oral   Take 300 mg by mouth at bedtime.           Marland Kitchen LISINOPRIL-HYDROCHLOROTHIAZIDE 20-25 MG PO TABS   Oral   Take 1 tablet by mouth daily.         . MELOXICAM 15 MG PO TABS   Oral   Take 15 mg by mouth every evening.         Marland Kitchen  METHOCARBAMOL 750 MG PO TABS   Oral   Take 750 mg by mouth 4 (four) times daily.           . OXYCODONE-ACETAMINOPHEN 10-325 MG PO TABS   Oral   Take 1 tablet by mouth 4 (four) times daily as needed. For pain         . OXYCODONE-ACETAMINOPHEN 5-325 MG PO TABS   Oral   Take 1 tablet by mouth every 6 (six) hours as needed for pain.   15 tablet   0   . PANTOPRAZOLE SODIUM 40 MG PO TBEC   Oral   Take 40 mg by mouth daily.         Marland Kitchen RANITIDINE HCL 150 MG PO CAPS   Oral   Take 1 capsule (150 mg total) by mouth 2 (two) times daily.   60 capsule   0   . TEMAZEPAM 30 MG PO CAPS   Oral   Take 60 mg by mouth at bedtime as needed. For sleep           BP 112/81  Pulse 68  Temp 97.3 F (36.3 C) (Oral)  Resp 20  Ht 5\' 2"  (1.575 m)  Wt 203 lb (92.08 kg)  BMI 37.13 kg/m2  SpO2 97%  LMP 11/23/2011  Physical Exam  Nursing note and vitals reviewed. Constitutional: She appears well-developed and well-nourished.  HENT:  Head: Normocephalic and atraumatic.  Eyes: Conjunctivae  normal and EOM are normal. Pupils are equal, round, and reactive to light.  Neck: Normal range of motion. Neck supple.  Cardiovascular: Normal rate, regular rhythm, normal heart sounds and intact distal pulses.   Pulmonary/Chest: Effort normal and breath sounds normal.  Abdominal: Soft. There is tenderness. There is guarding.  Musculoskeletal: Normal range of motion.  Neurological: She is alert.  Skin: Skin is warm and dry.  Psychiatric: She has a normal mood and affect. Thought content normal.    ED Course  Procedures (including critical care time)  Labs Reviewed - No data to display No results found.   No diagnosis found.    MDM  Patient with ruq pain, wbc 22,000 and [place] with gallstone and some gb wall thickening c.w. Cholecystitis.  Surgery paged.   Discussed patient with Dr. Leticia Penna and he will admit.  He asked what patient's reaction to pcn was- patient states her throat swelled and she had a rash after taking pcn as an outpatient.  She states she was given benadryl at home which relieved the symptoms.  She was not seen in the hospital and required no other intervention.   Hilario Quarry, MD 11/26/11 [phone]

## 2011-11-27 ENCOUNTER — Encounter (HOSPITAL_COMMUNITY): Payer: Self-pay | Admitting: *Deleted

## 2011-11-27 ENCOUNTER — Encounter (HOSPITAL_COMMUNITY)
Admission: EM | Disposition: A | Discharge status: Home / Self Care | Payer: Self-pay | Source: Home / Self Care | Attending: General Surgery

## 2011-11-27 ENCOUNTER — Encounter (HOSPITAL_COMMUNITY): Payer: Self-pay | Admitting: Anesthesiology

## 2011-11-27 ENCOUNTER — Inpatient Hospital Stay (HOSPITAL_COMMUNITY): Payer: Medicaid Other | Admitting: Anesthesiology

## 2011-11-27 HISTORY — PX: CHOLECYSTECTOMY: SHX55

## 2011-11-27 LAB — BASIC METABOLIC PANEL
CO2: 28 mEq/L (ref 19–32)
Chloride: 96 mEq/L (ref 96–112)
Glucose, Bld: 124 mg/dL — ABNORMAL HIGH (ref 70–99)
Potassium: 3.6 mEq/L (ref 3.5–5.1)
Sodium: 134 mEq/L — ABNORMAL LOW (ref 135–145)

## 2011-11-27 LAB — CBC
HCT: 43.9 % (ref 36.0–46.0)
Hemoglobin: 15.7 g/dL — ABNORMAL HIGH (ref 12.0–15.0)
MCH: 34.1 pg — ABNORMAL HIGH (ref 26.0–34.0)
MCV: 95.2 fL (ref 78.0–100.0)
RBC: 4.61 MIL/uL (ref 3.87–5.11)

## 2011-11-27 LAB — SURGICAL PCR SCREEN: MRSA, PCR: NEGATIVE

## 2011-11-27 SURGERY — LAPAROSCOPIC CHOLECYSTECTOMY
Anesthesia: General | Wound class: Contaminated

## 2011-11-27 MED ORDER — HYDROCHLOROTHIAZIDE 25 MG PO TABS
25.0000 mg | ORAL_TABLET | Freq: Every day | ORAL | Status: DC
  Administered 2011-11-27 – 2011-11-28 (×2): 25 mg via ORAL
  Filled 2011-11-27 (×2): qty 1

## 2011-11-27 MED ORDER — CITALOPRAM HYDROBROMIDE 20 MG PO TABS
20.0000 mg | ORAL_TABLET | Freq: Every day | ORAL | Status: DC
  Administered 2011-11-27 – 2011-11-28 (×2): 20 mg via ORAL
  Filled 2011-11-27 (×2): qty 1

## 2011-11-27 MED ORDER — BUPIVACAINE HCL (PF) 0.5 % IJ SOLN
INTRAMUSCULAR | Status: AC
  Filled 2011-11-27: qty 30

## 2011-11-27 MED ORDER — FENTANYL CITRATE 0.05 MG/ML IJ SOLN
INTRAMUSCULAR | Status: AC
  Filled 2011-11-27: qty 2

## 2011-11-27 MED ORDER — HYDROCODONE-ACETAMINOPHEN 7.5-325 MG PO TABS
1.0000 | ORAL_TABLET | Freq: Four times a day (QID) | ORAL | Status: DC | PRN
  Administered 2011-11-27 – 2011-11-28 (×2): 2 via ORAL
  Filled 2011-11-27 (×3): qty 2

## 2011-11-27 MED ORDER — BUPIVACAINE HCL (PF) 0.5 % IJ SOLN
INTRAMUSCULAR | Status: DC | PRN
  Administered 2011-11-27: 10 mL

## 2011-11-27 MED ORDER — CELECOXIB 100 MG PO CAPS
200.0000 mg | ORAL_CAPSULE | Freq: Two times a day (BID) | ORAL | Status: DC
  Administered 2011-11-27 – 2011-11-28 (×3): 200 mg via ORAL
  Filled 2011-11-27 (×3): qty 2

## 2011-11-27 MED ORDER — ALBUTEROL SULFATE HFA 108 (90 BASE) MCG/ACT IN AERS: 2.0000 | INHALATION_SPRAY | Freq: Four times a day (QID) | RESPIRATORY_TRACT | Status: DC | PRN

## 2011-11-27 MED ORDER — LISINOPRIL 10 MG PO TABS
20.0000 mg | ORAL_TABLET | Freq: Every day | ORAL | Status: DC
  Administered 2011-11-27: 20 mg via ORAL
  Filled 2011-11-27: qty 2

## 2011-11-27 MED ORDER — SUCCINYLCHOLINE CHLORIDE 20 MG/ML IJ SOLN
INTRAMUSCULAR | Status: DC | PRN
  Administered 2011-11-27: 120 mg via INTRAVENOUS

## 2011-11-27 MED ORDER — GLYCOPYRROLATE 0.2 MG/ML IJ SOLN
INTRAMUSCULAR | Status: DC | PRN
  Administered 2011-11-27: 0.4 mg via INTRAVENOUS
  Administered 2011-11-27: 0.2 mg via INTRAVENOUS

## 2011-11-27 MED ORDER — PANTOPRAZOLE SODIUM 40 MG PO TBEC
40.0000 mg | DELAYED_RELEASE_TABLET | Freq: Two times a day (BID) | ORAL | Status: DC
Start: 2011-11-27 — End: 2011-11-28
  Administered 2011-11-27 – 2011-11-28 (×3): 40 mg via ORAL
  Filled 2011-11-27 (×3): qty 1

## 2011-11-27 MED ORDER — LACTATED RINGERS IV SOLN: INTRAVENOUS | Status: DC

## 2011-11-27 MED ORDER — FENTANYL CITRATE 0.05 MG/ML IJ SOLN
INTRAMUSCULAR | Status: AC
  Filled 2011-11-27: qty 5

## 2011-11-27 MED ORDER — GABAPENTIN 300 MG PO CAPS
300.0000 mg | ORAL_CAPSULE | Freq: Two times a day (BID) | ORAL | Status: DC
  Administered 2011-11-27 – 2011-11-28 (×3): 300 mg via ORAL
  Filled 2011-11-27 (×3): qty 1

## 2011-11-27 MED ORDER — LIDOCAINE HCL (CARDIAC) 20 MG/ML IV SOLN
INTRAVENOUS | Status: DC | PRN
  Administered 2011-11-27: 50 mg via INTRAVENOUS

## 2011-11-27 MED ORDER — GLYCOPYRROLATE 0.2 MG/ML IJ SOLN
INTRAMUSCULAR | Status: AC
  Filled 2011-11-27: qty 3

## 2011-11-27 MED ORDER — MIDAZOLAM HCL 2 MG/2ML IJ SOLN
1.0000 mg | INTRAMUSCULAR | Status: DC | PRN
  Administered 2011-11-27: 2 mg via INTRAVENOUS

## 2011-11-27 MED ORDER — FENTANYL CITRATE 0.05 MG/ML IJ SOLN
INTRAMUSCULAR | Status: DC | PRN
  Administered 2011-11-27: 50 ug via INTRAVENOUS
  Administered 2011-11-27: 25 ug via INTRAVENOUS
  Administered 2011-11-27: 75 ug via INTRAVENOUS

## 2011-11-27 MED ORDER — MIDAZOLAM HCL 2 MG/2ML IJ SOLN
INTRAMUSCULAR | Status: AC
  Filled 2011-11-27: qty 2

## 2011-11-27 MED ORDER — ROCURONIUM BROMIDE 50 MG/5ML IV SOLN
INTRAVENOUS | Status: AC
  Filled 2011-11-27: qty 1

## 2011-11-27 MED ORDER — ONDANSETRON HCL 4 MG/2ML IJ SOLN: 4.0000 mg | Freq: Once | INTRAMUSCULAR | Status: DC | PRN

## 2011-11-27 MED ORDER — PROPOFOL 10 MG/ML IV EMUL
INTRAVENOUS | Status: AC
  Filled 2011-11-27: qty 20

## 2011-11-27 MED ORDER — ONDANSETRON HCL 4 MG/2ML IJ SOLN
INTRAMUSCULAR | Status: AC
  Filled 2011-11-27: qty 2

## 2011-11-27 MED ORDER — LISINOPRIL-HYDROCHLOROTHIAZIDE 20-25 MG PO TABS: 0.5000 | ORAL_TABLET | Freq: Every day | ORAL | Status: DC

## 2011-11-27 MED ORDER — LACTATED RINGERS IV SOLN
INTRAVENOUS | Status: DC | PRN
  Administered 2011-11-27 (×2): via INTRAVENOUS

## 2011-11-27 MED ORDER — LIDOCAINE HCL (PF) 1 % IJ SOLN
INTRAMUSCULAR | Status: AC
  Filled 2011-11-27: qty 5

## 2011-11-27 MED ORDER — SODIUM CHLORIDE 0.9 % IR SOLN
Status: DC | PRN
  Administered 2011-11-27: 1000 mL

## 2011-11-27 MED ORDER — FENTANYL CITRATE 0.05 MG/ML IJ SOLN
25.0000 ug | INTRAMUSCULAR | Status: DC | PRN
  Administered 2011-11-27 (×4): 50 ug via INTRAVENOUS

## 2011-11-27 MED ORDER — ALPRAZOLAM 1 MG PO TABS
2.0000 mg | ORAL_TABLET | Freq: Three times a day (TID) | ORAL | Status: DC
  Administered 2011-11-27 – 2011-11-28 (×4): 2 mg via ORAL
  Filled 2011-11-27 (×4): qty 2

## 2011-11-27 MED ORDER — HYDROMORPHONE HCL PF 1 MG/ML IJ SOLN
1.0000 mg | INTRAMUSCULAR | Status: DC | PRN
  Administered 2011-11-27 (×2): 2 mg via INTRAVENOUS
  Administered 2011-11-28 (×2): 1 mg via INTRAVENOUS
  Filled 2011-11-27: qty 2
  Filled 2011-11-27: qty 1
  Filled 2011-11-27: qty 2
  Filled 2011-11-27: qty 1

## 2011-11-27 MED ORDER — SODIUM CHLORIDE 0.9 % IR SOLN
Status: DC | PRN
  Administered 2011-11-27: 3000 mL

## 2011-11-27 MED ORDER — ONDANSETRON HCL 4 MG/2ML IJ SOLN
4.0000 mg | Freq: Once | INTRAMUSCULAR | Status: AC
  Administered 2011-11-27: 4 mg via INTRAVENOUS

## 2011-11-27 MED ORDER — NEOSTIGMINE METHYLSULFATE 1 MG/ML IJ SOLN
INTRAMUSCULAR | Status: AC
  Filled 2011-11-27: qty 10

## 2011-11-27 MED ORDER — SUCCINYLCHOLINE CHLORIDE 20 MG/ML IJ SOLN
INTRAMUSCULAR | Status: AC
  Filled 2011-11-27: qty 1

## 2011-11-27 MED ORDER — ACETAMINOPHEN 650 MG RE SUPP
650.0000 mg | Freq: Four times a day (QID) | RECTAL | Status: DC | PRN
  Administered 2011-11-27: 650 mg via RECTAL
  Filled 2011-11-27: qty 1

## 2011-11-27 MED ORDER — ROCURONIUM BROMIDE 100 MG/10ML IV SOLN
INTRAVENOUS | Status: DC | PRN
  Administered 2011-11-27: 10 mg via INTRAVENOUS
  Administered 2011-11-27: 40 mg via INTRAVENOUS

## 2011-11-27 MED ORDER — TEMAZEPAM 15 MG PO CAPS: 30.0000 mg | ORAL_CAPSULE | Freq: Every evening | ORAL | Status: DC | PRN

## 2011-11-27 MED ORDER — PROPOFOL 10 MG/ML IV EMUL
INTRAVENOUS | Status: DC | PRN
  Administered 2011-11-27: 150 mg via INTRAVENOUS

## 2011-11-27 MED ORDER — HEMOSTATIC AGENTS (NO CHARGE) OPTIME
TOPICAL | Status: DC | PRN
  Administered 2011-11-27: 1 via TOPICAL

## 2011-11-27 MED ORDER — NEOSTIGMINE METHYLSULFATE 1 MG/ML IJ SOLN
INTRAMUSCULAR | Status: DC | PRN
  Administered 2011-11-27: 3 mg via INTRAVENOUS

## 2011-11-27 SURGICAL SUPPLY — 44 items
APL SKNCLS STERI-STRIP NONHPOA (GAUZE/BANDAGES/DRESSINGS) ×1
APPLIER CLIP UNV 5X34 EPIX (ENDOMECHANICALS) ×3 IMPLANT
APR XCLPCLP 20M/L UNV 34X5 (ENDOMECHANICALS) ×2
BAG HAMPER (MISCELLANEOUS) ×2 IMPLANT
BAG SPEC RTRVL LRG 6X4 10 (ENDOMECHANICALS) ×1
BENZOIN TINCTURE PRP APPL 2/3 (GAUZE/BANDAGES/DRESSINGS) ×2 IMPLANT
CLOTH BEACON ORANGE TIMEOUT ST (SAFETY) ×2 IMPLANT
COVER LIGHT HANDLE STERIS (MISCELLANEOUS) ×4 IMPLANT
DECANTER SPIKE VIAL GLASS SM (MISCELLANEOUS) ×2 IMPLANT
DEVICE TROCAR PUNCTURE CLOSURE (ENDOMECHANICALS) ×2 IMPLANT
DURAPREP 26ML APPLICATOR (WOUND CARE) ×2 IMPLANT
ELECT REM PT RETURN 9FT ADLT (ELECTROSURGICAL) ×2
ELECTRODE REM PT RTRN 9FT ADLT (ELECTROSURGICAL) ×1 IMPLANT
FILTER SMOKE EVAC LAPAROSHD (FILTER) ×2 IMPLANT
FORMALIN 10 PREFIL 120ML (MISCELLANEOUS) ×2 IMPLANT
GLOVE BIOGEL PI IND STRL 7.5 (GLOVE) ×1 IMPLANT
GLOVE BIOGEL PI IND STRL 8.5 (GLOVE) IMPLANT
GLOVE BIOGEL PI INDICATOR 7.5 (GLOVE) ×1
GLOVE BIOGEL PI INDICATOR 8.5 (GLOVE) ×1
GLOVE ECLIPSE 7.0 STRL STRAW (GLOVE) ×2 IMPLANT
GLOVE ECLIPSE 8.0 STRL XLNG CF (GLOVE) ×1 IMPLANT
GLOVE SS BIOGEL STRL SZ 6.5 (GLOVE) IMPLANT
GLOVE SUPERSENSE BIOGEL SZ 6.5 (GLOVE) ×1
GOWN STRL REIN XL XLG (GOWN DISPOSABLE) ×6 IMPLANT
HEMOSTAT SNOW SURGICEL 2X4 (HEMOSTASIS) ×2 IMPLANT
INST SET LAPROSCOPIC AP (KITS) ×2 IMPLANT
IV NS IRRIG 3000ML ARTHROMATIC (IV SOLUTION) ×2 IMPLANT
KIT ROOM TURNOVER APOR (KITS) ×2 IMPLANT
MANIFOLD NEPTUNE II (INSTRUMENTS) ×2 IMPLANT
NDL INSUFFLATION 14GA 120MM (NEEDLE) ×1 IMPLANT
NEEDLE INSUFFLATION 14GA 120MM (NEEDLE) ×2 IMPLANT
PACK LAP CHOLE LZT030E (CUSTOM PROCEDURE TRAY) ×2 IMPLANT
PAD ARMBOARD 7.5X6 YLW CONV (MISCELLANEOUS) ×2 IMPLANT
POUCH SPECIMEN RETRIEVAL 10MM (ENDOMECHANICALS) ×2 IMPLANT
SET BASIN LINEN APH (SET/KITS/TRAYS/PACK) ×2 IMPLANT
SET TUBE IRRIG SUCTION NO TIP (IRRIGATION / IRRIGATOR) ×1 IMPLANT
SLEEVE Z-THREAD 5X100MM (TROCAR) ×4 IMPLANT
STRIP CLOSURE SKIN 1/2X4 (GAUZE/BANDAGES/DRESSINGS) ×3 IMPLANT
SUT MNCRL AB 4-0 PS2 18 (SUTURE) ×3 IMPLANT
SUT VIC AB 2-0 CT2 27 (SUTURE) ×2 IMPLANT
SYR 50ML LL SCALE MARK (SYRINGE) ×1 IMPLANT
TROCAR Z-THRD FIOS HNDL 11X100 (TROCAR) ×2 IMPLANT
TROCAR Z-THREAD FIOS 5X100MM (TROCAR) ×2 IMPLANT
WARMER LAPAROSCOPE (MISCELLANEOUS) ×2 IMPLANT

## 2011-11-27 NOTE — Op Note (Signed)
Patient:  [name]  DOB:  Jun 01, 1970  MRN:  [id number]   Preop Diagnosis:  Acute cholecystitis  Postop Diagnosis:  The same  Procedure:  Laparoscopic cholecystectomy  Surgeon:  Dr. Tilford Pillar  Anes:  General endotracheal, 0.5% Sensorcaine plain for local  Indications:  Patient is a 41 year old female who presented to [facility] with less than 24 hours of increasing right upper quadrant abdominal pain. Workup and evaluation was consistent with suspected acute cholecystitis cholelithiasis. Risks benefits alternatives a laparoscopic possible open cholecystectomy were discussed at length patient including but not limited to risk of bleeding, infection, bile leak, small bowel injury, common bile duct injury, intraoperative cardiac and pulmonary events. Patient's questions and concerns were addressed the patient as consented for the planned procedure.  Procedure note:  Patient is taken to the operating room was placed in a supine position the or table time the general anesthetic is a Optician, dispensing. Once patient was asleep she symmetrically intubated a nurse anesthetist. At this point her abdomen is prepped with DuraPrep solution and draped in standard fashion. A stab incision was created supraumbilically with 11 blade scalpel with additional dissection down to subcuticular tissue carried out using a Coker clamp which utilized to grasp the anterior abdominal wall fascia and lift this anteriorly. A Veress needle is inserted saline drop test is utilized confirm intraperitoneal placement and then pneumoperitoneum was initiated. Once sufficient pneumoperitoneum was obtained an 11 mm trochars inserted over laparoscope allowing visualization the trocar entering into the peritoneal cavity. At this point the inner cannula was removed the laparoscope was reinserted there is no evidence of any trocar or Veress needle placement injury. At this time the remaining trochars replaced with a 5 mm can  epigastrium, 5 mm trocar in the midline, and a 5 mm trocar in the right lateral abdominal wall. At this point patient's placed into a reverse Trendelenburg left lateral decubitus position. The fundus of the gallbladder is grasped a regular grasper but due to the taunt nature the gallbladder a Weck needle is brought to the field and utilized to aspirate the gallbladder contents. This is noted to be bilious. The and this is then grasped and lifted up and over the right lobe the liver. Blunt dissection is carried out to strip the peritoneal reflection off the infundibulum exposing both the cystic duct and cystic artery is entered into the infundibulum. A window was created behind both of these structures. 3 endoclips placed proximally one distally and the cystic duct and the cystic duct was divided to 2 most distal clips. Similarly the cystic artery has 2 endoclips proximally one distally and cystic arteries divided between 2 most is a clips. What appeared to be a posterior branch of the cystic artery and these were also clipped with 2 clips and divided distally. At this time electrocautery was utilized to dissect the gallbladder free from the gallbladder fossa. Once the gallbladder was free it was placed into an Endo Catch bag and placed up and over the right lobe the liver. There was some minimal bile spillage during the procedure and therefore I did irrigate the field copiously with a suction irrigator. Returning aspirate was clear. Inspection the gallbladder fossa indicated good hemostasis although due to the raw nature of the gallbladder fossa I did opt to place a piece of Surgicel snow into the gallbladder fossa. The endoclips were noted be in excellent position with no evidence of any bleeding or bile leak. At this time I turned my attention to  closure.  Using an Endo Close suture passing device a 2 Vicryl sutures passed to the umbilical trocar site. With this suture and placed the gallbladder was retrieved  was removed through the umbilical trocar site and intact Endo Catch bag. Some short dilatation was required to enlarge the trocar site adequately to remove the gallbladder. Once the gallbladder is free is placed in the back table and sent as a permanent specimen. At this point the pneumoperitoneum was evacuated. Trochars were removed. The Vicryl sutures secured. Local anesthetic is instilled. A 4-0 Monocryl utilized reapproximate the skin edges at all 4 trocar sites. The skin was washed dried moist dry towel. Benzoin is applied around the incisions. Half-inch are suture placed. The drapes removed patient left come out of general anesthetic. The patient is transferred to the postanesthetic care unit in stable condition. At the conclusion of procedure all instrument, sponge, needle counts are correct. Patient tolerated procedure extremely well.  Complications:  None  EBL:  Less than 50 ML  Specimen:  Gallbladder

## 2011-11-27 NOTE — Progress Notes (Signed)
Patient came up to floor and pharmacy called RN about one of patient's IV antibiotics. Patient is allergic to PCN, but she had an order for Cefoxitin. Also, patient stated she took xanax at home. Doctor was notified and doctor put in new orders and D/C'ed IV antibiotic.

## 2011-11-27 NOTE — Progress Notes (Addendum)
Pt. Refusing to wear SCDs.  Pt. Educated  & aware of increased risk of blood clots during & after surgery.  Dr. Leticia Penna aware.

## 2011-11-27 NOTE — Anesthesia Preprocedure Evaluation (Signed)
Anesthesia Evaluation  Patient identified by MRN, date of birth, ID band Patient awake    Reviewed: Allergy & Precautions, H&P , NPO status , Patient's Chart, lab work & pertinent test results  History of Anesthesia Complications Negative for: history of anesthetic complications  Airway Mallampati: II TM Distance: >3 FB Neck ROM: Limited    Dental  (+) Poor Dentition, Partial Upper, Chipped and Dental Advisory Given   Pulmonary neg pulmonary ROS,  breath sounds clear to auscultation        Cardiovascular hypertension, Pt. on medications Rhythm:Regular Rate:Normal     Neuro/Psych PSYCHIATRIC DISORDERS (panic attacks) Anxiety Depression    GI/Hepatic RUQ pain   Endo/Other  Morbid obesity  Renal/GU      Musculoskeletal   Abdominal   Peds  Hematology   Anesthesia Other Findings   Reproductive/Obstetrics                           Anesthesia Physical Anesthesia Plan  ASA: II  Anesthesia Plan: General   Post-op Pain Management:    Induction: Intravenous, Rapid sequence and Cricoid pressure planned  Airway Management Planned: Oral ETT  Additional Equipment:   Intra-op Plan:   Post-operative Plan: Extubation in OR  Informed Consent: I have reviewed the patients History and Physical, chart, labs and discussed the procedure including the risks, benefits and alternatives for the proposed anesthesia with the patient or authorized representative who has indicated his/her understanding and acceptance.     Plan Discussed with:   Anesthesia Plan Comments:         Anesthesia Quick Evaluation

## 2011-11-27 NOTE — H&P (Signed)
[name] is an 41 y.o. female.   Chief Complaint: Right upper quadrant abdominal pain. HPI: Patient presented to [facility] emergency department with increasing right upper quadrant abdominal pain. Pain started less than 24 hours ago. It is been constant and severe. It is been localized the right upper quadrant with some radiation around to the back. She has had associated nausea but no emesis. Subjective fevers. She has had similar symptomatology in the past but never this severe. She was told by her primary care physician at this is likely related to ulcer disease. She states some indigestion. She's had no change in bowel movements. No melena or hematochezia. No change in urination. No sick contacts. Positive family history for biliary disease  Past Medical History  Diagnosis Date  . Scoliosis   . Hypertension   . Panic attack   . Back pain   . Depression   . Anxiety   . Ulcer     Past Surgical History  Procedure Date  . Back surgery   . Cesarean section   . Neck surgery   . Tubal ligation     History reviewed. No pertinent family history. Social History:  reports that she has been smoking Cigarettes.  She has been smoking about 2 packs per day. She does not have any smokeless tobacco history on file. She reports that she does not drink alcohol or use illicit drugs.  Allergies:  Allergies  Allergen Reactions  . Penicillins Anaphylaxis  . Ibuprofen Nausea And Vomiting    GI upset  . Aspirin Nausea And Vomiting    Gi upset    Medications Prior to Admission  Medication Sig Dispense Refill  . ALPRAZolam (XANAX) 1 MG tablet Take 2 mg by mouth 3 (three) times daily.       . citalopram (CELEXA) 20 MG tablet Take 20 mg by mouth daily.       Marland Kitchen gabapentin (NEURONTIN) 300 MG capsule Take 300 mg by mouth 2 (two) times daily.       Marland Kitchen lisinopril-hydrochlorothiazide (PRINZIDE,ZESTORETIC) 20-25 MG per tablet Take 0.5 tablets by mouth daily.       . meloxicam (MOBIC) 15 MG tablet  Take 15 mg by mouth daily.       . methocarbamol (ROBAXIN) 750 MG tablet Take 750 mg by mouth 4 (four) times daily.        Marland Kitchen oxyCODONE-acetaminophen (PERCOCET) 10-325 MG per tablet Take 1 tablet by mouth 4 (four) times daily as needed. For pain      . pantoprazole (PROTONIX) 40 MG tablet Take 40 mg by mouth 2 (two) times daily.       . temazepam (RESTORIL) 30 MG capsule Take 60 mg by mouth at bedtime as needed. For sleep      . albuterol (PROVENTIL HFA;VENTOLIN HFA) 108 (90 BASE) MCG/ACT inhaler Inhale 2 puffs into the lungs every 6 (six) hours as needed. For shortness of breath      . ranitidine (ZANTAC) 150 MG capsule Take 1 capsule (150 mg total) by mouth 2 (two) times daily.  60 capsule  0    Results for orders placed during the hospital encounter of 11/26/11 (from the past 48 hour(s))  CBC WITH DIFFERENTIAL     Status: Abnormal   Collection Time   11/26/11  4:02 PM      Component Value Range Comment   WBC 22.8 (*) 4.0 - 10.5 K/uL    RBC 4.98  3.87 - 5.11 MIL/uL  Hemoglobin 17.4 (*) 12.0 - 15.0 g/dL    HCT 16.1 (*) 09.6 - 46.0 %    MCV 94.4  78.0 - 100.0 fL    MCH 34.9 (*) 26.0 - 34.0 pg    MCHC 37.0 (*) 30.0 - 36.0 g/dL    RDW 04.5  40.9 - 81.1 %    Platelets 293  150 - 400 K/uL    Neutrophils Relative 88 (*) 43 - 77 %    Neutro Abs 20.0 (*) 1.7 - 7.7 K/uL    Lymphocytes Relative 8 (*) 12 - 46 %    Lymphs Abs 1.8  0.7 - 4.0 K/uL    Monocytes Relative 4  3 - 12 %    Monocytes Absolute 1.0  0.1 - 1.0 K/uL    Eosinophils Relative 0  0 - 5 %    Eosinophils Absolute 0.0  0.0 - 0.7 K/uL    Basophils Relative 0  0 - 1 %    Basophils Absolute 0.0  0.0 - 0.1 K/uL   COMPREHENSIVE METABOLIC PANEL     Status: Abnormal   Collection Time   11/26/11  4:02 PM      Component Value Range Comment   Sodium 137  135 - 145 mEq/L    Potassium 3.4 (*) 3.5 - 5.1 mEq/L    Chloride 100  96 - 112 mEq/L    CO2 26  19 - 32 mEq/L    Glucose, Bld 144 (*) 70 - 99 mg/dL    BUN 12  6 - 23 mg/dL     Creatinine, Ser 9.14  0.50 - 1.10 mg/dL    Calcium 9.5  8.4 - 78.2 mg/dL    Total Protein 7.7  6.0 - 8.3 g/dL    Albumin 3.7  3.5 - 5.2 g/dL    AST 20  0 - 37 U/L    ALT 23  0 - 35 U/L    Alkaline Phosphatase 147 (*) 39 - 117 U/L    Total Bilirubin 0.3  0.3 - 1.2 mg/dL    GFR calc non Af Amer 83 (*) >90 mL/min    GFR calc Af Amer >90  >90 mL/min   LIPASE, BLOOD     Status: Normal   Collection Time   11/26/11  4:02 PM      Component Value Range Comment   Lipase 37  11 - 59 U/L   URINALYSIS, ROUTINE W REFLEX MICROSCOPIC     Status: Abnormal   Collection Time   11/26/11  4:13 PM      Component Value Range Comment   Color, Urine YELLOW  YELLOW    APPearance CLEAR  CLEAR    Specific Gravity, Urine >1.030 (*) 1.005 - 1.030    pH 5.5  5.0 - 8.0    Glucose, UA NEGATIVE  NEGATIVE mg/dL    Hgb urine dipstick NEGATIVE  NEGATIVE    Bilirubin Urine NEGATIVE  NEGATIVE    Ketones, ur NEGATIVE  NEGATIVE mg/dL    Protein, ur NEGATIVE  NEGATIVE mg/dL    Urobilinogen, UA 0.2  0.0 - 1.0 mg/dL    Nitrite POSITIVE (*) NEGATIVE    Leukocytes, UA NEGATIVE  NEGATIVE   PREGNANCY, URINE     Status: Normal   Collection Time   11/26/11  4:13 PM      Component Value Range Comment   Preg Test, Ur NEGATIVE  NEGATIVE   URINE MICROSCOPIC-ADD ON     Status: Abnormal   Collection  Time   11/26/11  4:13 PM      Component Value Range Comment   Squamous Epithelial / LPF FEW (*) RARE    WBC, UA 3-6  <3 WBC/hpf    RBC / HPF 0-2  <3 RBC/hpf    Bacteria, UA MANY (*) RARE   SURGICAL PCR SCREEN     Status: Normal   Collection Time   11/26/11 11:35 PM      Component Value Range Comment   MRSA, PCR NEGATIVE  NEGATIVE    Staphylococcus aureus NEGATIVE  NEGATIVE   BASIC METABOLIC PANEL     Status: Abnormal   Collection Time   11/27/11  5:41 AM      Component Value Range Comment   Sodium 134 (*) 135 - 145 mEq/L    Potassium 3.6  3.5 - 5.1 mEq/L    Chloride 96  96 - 112 mEq/L    CO2 28  19 - 32 mEq/L     Glucose, Bld 124 (*) 70 - 99 mg/dL    BUN 9  6 - 23 mg/dL    Creatinine, Ser 6.04  0.50 - 1.10 mg/dL    Calcium 9.0  8.4 - 54.0 mg/dL    GFR calc non Af Amer 78 (*) >90 mL/min    GFR calc Af Amer >90  >90 mL/min   CBC     Status: Abnormal   Collection Time   11/27/11  5:41 AM      Component Value Range Comment   WBC 21.9 (*) 4.0 - 10.5 K/uL    RBC 4.61  3.87 - 5.11 MIL/uL    Hemoglobin 15.7 (*) 12.0 - 15.0 g/dL    HCT 98.1  19.1 - 47.8 %    MCV 95.2  78.0 - 100.0 fL    MCH 34.1 (*) 26.0 - 34.0 pg    MCHC 35.8  30.0 - 36.0 g/dL    RDW 29.5  62.1 - 30.8 %    Platelets 204  150 - 400 K/uL DELTA CHECK NOTED   US Abdomen Complete  11/26/2011  *RADIOLOGY REPORT*  Clinical Data:  Abdominal pain.  COMPLETE ABDOMINAL ULTRASOUND  Comparison:  05/24/2007 CT abdomen and pelvis.  Findings:  Gallbladder:  Non mobile 1.7 cm gallstone with mild gallbladder wall thickening (3.8 mm). The patient was tender over this region during scanning per ultrasound technologist.  Common bile duct:  10.9 mm.  Distal aspect not well delineated secondary to overlying bowel gas.  Liver:  Enlarged liver spanning over 22.1 cm.  Increased echogenicity consistent with fatty infiltration.  IVC:  Appears normal.  Pancreas:  Not visualized secondary to overlying bowel gas.  Spleen:  10.9 cm.  No focal mass.  Right Kidney:  12.1 cm. No hydronephrosis or renal mass.  Left Kidney:  12.4 cm. No hydronephrosis or renal mass.  Abdominal aorta:  No aneurysm identified.  IMPRESSION: Non mobile 1.7 cm gallstone with mild gallbladder wall thickening (3.8 mm). The patient was tender over this region during scanning per ultrasound technologist.  Dilated common bile duct at 10.9 mm.  Distal aspect not visualized secondary to bowel gas.  Enlarged fatty liver.  Pancreas not visualized secondary to bowel gas.  Critical Value/emergent results were called by telephone at the time of interpretation on 11/26/2011 at 5:40 p.m. to Dr. Rosalia Hammers.   Original  Report Authenticated By: Lacy Duverney, M.D.     Review of Systems  Constitutional: Positive for fever. Negative for chills, weight loss, malaise/fatigue and  diaphoresis.  HENT: Negative.   Eyes: Negative.   Respiratory: Negative.   Cardiovascular: Negative.   Gastrointestinal: Positive for heartburn, nausea and abdominal pain (right upper quadrant). Negative for vomiting, diarrhea, constipation, blood in stool and melena.  Genitourinary: Negative.   Musculoskeletal: Negative.   Skin: Negative.   Neurological: Negative.  Negative for weakness.  Endo/Heme/Allergies: Negative.   Psychiatric/Behavioral: Negative.     Blood pressure 99/61, pulse 77, temperature 98.6 F (37 C), temperature source Oral, resp. rate 20, height 5\' 2"  (1.575 m), weight 92.5 kg (203 lb 14.8 oz), last menstrual period 11/23/2011, SpO2 96.00%. Physical Exam  Constitutional: She is oriented to person, place, and time. She appears well-developed and well-nourished. No distress.       Obese  HENT:  Head: Normocephalic and atraumatic.  Eyes: Conjunctivae normal and EOM are normal. Pupils are equal, round, and reactive to light. No scleral icterus.  Neck: Normal range of motion. Neck supple. No tracheal deviation present. No thyromegaly present.  Cardiovascular: Normal rate, regular rhythm and normal heart sounds.   Respiratory: Effort normal and breath sounds normal. No respiratory distress. She has no wheezes.  GI: Soft. She exhibits no distension and no mass. There is tenderness (moderate to severe right upper quadrant abdominal tenderness. Positive Murphy sign. No diffuse peritoneal signs.). There is no rebound and no guarding.       Obese  Musculoskeletal: Normal range of motion.  Lymphadenopathy:    She has no cervical adenopathy.  Neurological: She is alert and oriented to person, place, and time.  Skin: Skin is warm and dry.     Assessment/Plan Acute cholecystitis. Ultrasound findings were discussed with  the patient. Patient will be continued in an n.p.o. status. Continued on IV fluid hydration. Continued on antibiotic coverage. At this point surgical options were discussed with the patient. We will plan to proceed to the operator for a laparoscopic possible open cholecystectomy as discussed with the patient. Risks benefits alternatives were discussed at length including but not limited to risk of bleeding, infection, bile leak, small bowel injury, common bile duct injury, intraoperative cardiac and pulmonary events. DVT prophylaxis will be maintained. Patient's questions and concerns are addressed the  Lurene Robley C 11/27/2011, 10:14 AM

## 2011-11-27 NOTE — Anesthesia Postprocedure Evaluation (Signed)
  Anesthesia Post-op Note  Patient: [name]  Procedure(s) Performed: Procedure(s) (LRB) with comments: LAPAROSCOPIC CHOLECYSTECTOMY (N/A)  Patient Location: PACU  Anesthesia Type:General  Level of Consciousness: awake, alert  and oriented  Airway and Oxygen Therapy: Patient Spontanous Breathing  Post-op Pain: mild  Post-op Assessment: Post-op Vital signs reviewed, Patient's Cardiovascular Status Stable, Respiratory Function Stable, Patent Airway, No signs of Nausea or vomiting, Adequate PO intake, Pain level controlled, No headache, No backache, No residual numbness and No residual motor weakness  Post-op Vital Signs: Reviewed and stable  Complications: No apparent anesthesia complications

## 2011-11-27 NOTE — Progress Notes (Signed)
Patient had a temperature of 101.8 and also had been crying in pain. Next pain medicine dose wasn't due until 5am. Doctor was notified and new orders were given for tylenol supp. 650 q6hrs and also doctor stated that nurse could give pain medicine early. Will continue to monitor patient.

## 2011-11-27 NOTE — Preoperative (Signed)
Beta Blockers   Reason not to administer Beta Blockers:Not Applicable 

## 2011-11-27 NOTE — Transfer of Care (Signed)
Immediate Anesthesia Transfer of Care Note  Patient: [name]  Procedure(s) Performed: Procedure(s) (LRB) with comments: LAPAROSCOPIC CHOLECYSTECTOMY (N/A)  Patient Location: PACU  Anesthesia Type:General  Level of Consciousness: awake, alert  and oriented  Airway & Oxygen Therapy: Patient Spontanous Breathing and Patient connected to nasal cannula oxygen  Post-op Assessment: Report given to PACU RN and Post -op Vital signs reviewed and stable  Post vital signs: Reviewed and stable  Complications: No apparent anesthesia complications

## 2011-11-28 LAB — URINE CULTURE

## 2011-11-28 MED ORDER — OXYCODONE-ACETAMINOPHEN 5-325 MG PO TABS
1.0000 | ORAL_TABLET | ORAL | Status: DC | PRN
  Administered 2011-11-28 (×2): 1 via ORAL
  Filled 2011-11-28 (×2): qty 1

## 2011-11-28 MED ORDER — OXYCODONE HCL 5 MG PO TABS
5.0000 mg | ORAL_TABLET | ORAL | Status: DC | PRN
  Administered 2011-11-28 (×2): 5 mg via ORAL
  Filled 2011-11-28 (×2): qty 1

## 2011-11-28 MED ORDER — OXYCODONE-ACETAMINOPHEN 10-325 MG PO TABS: 1.0000 | ORAL_TABLET | ORAL | Status: DC | PRN

## 2011-11-28 NOTE — Progress Notes (Signed)
Patient refused flu and pneumonia vaccines. Wished to f/u with Dr. Sudie Bailey for vaccines. Vis given

## 2011-11-28 NOTE — Progress Notes (Signed)
1 Day Post-Op  Subjective: Pain poorly controlled. Patient states pain is controlled better on her home Percocets than current regiment. Tolerating diet. No nausea. And Lipitor he.  Objective: Vital signs in last 24 hours: Temp:  [97.8 F (36.6 C)-99.6 F (37.6 C)] 98.6 F (37 C) (11/16 0506) Pulse Rate:  [68-98] 79  (11/16 0506) Resp:  [15-25] 19  (11/16 0506) BP: (99-135)/(58-85) 99/58 mmHg (11/16 0506) SpO2:  [87 %-100 %] 94 % (11/16 0506) Last BM Date: 11/25/11  Intake/Output from previous day: 11/15 0701 - 11/16 0700 In: 5840.2 [P.O.:1040; I.V.:4800.2] Out: 30 [Blood:30] Intake/Output this shift:    General appearance: alert and no distress GI: Positive bowel sounds, soft, obese, moderate expected postoperative tenderness. Incisions are clean dry and intact the  Lab Results:   [facility] 11/27/11 0541 11/26/11 1602  WBC 21.9* 22.8*  HGB 15.7* 17.4*  HCT 43.9 47.0*  PLT 204 293   BMET  Basename 11/27/11 0541 11/26/11 1602  NA 134* 137  K 3.6 3.4*  CL 96 100  CO2 28 26  GLUCOSE 124* 144*  BUN 9 12  CREATININE 0.91 0.86  CALCIUM 9.0 9.5   PT/INR No results found for this basename: LABPROT:2,INR:2 in the last 72 hours ABG No results found for this basename: PHART:2,PCO2:2,PO2:2,HCO3:2 in the last 72 hours  Studies/Results: US Abdomen Complete  11/26/2011  *RADIOLOGY REPORT*  Clinical Data:  Abdominal pain.  COMPLETE ABDOMINAL ULTRASOUND  Comparison:  05/24/2007 CT abdomen and pelvis.  Findings:  Gallbladder:  Non mobile 1.7 cm gallstone with mild gallbladder wall thickening (3.8 mm). The patient was tender over this region during scanning per ultrasound technologist.  Common bile duct:  10.9 mm.  Distal aspect not well delineated secondary to overlying bowel gas.  Liver:  Enlarged liver spanning over 22.1 cm.  Increased echogenicity consistent with fatty infiltration.  IVC:  Appears normal.  Pancreas:  Not visualized secondary to overlying bowel gas.  Spleen:   10.9 cm.  No focal mass.  Right Kidney:  12.1 cm. No hydronephrosis or renal mass.  Left Kidney:  12.4 cm. No hydronephrosis or renal mass.  Abdominal aorta:  No aneurysm identified.  IMPRESSION: Non mobile 1.7 cm gallstone with mild gallbladder wall thickening (3.8 mm). The patient was tender over this region during scanning per ultrasound technologist.  Dilated common bile duct at 10.9 mm.  Distal aspect not visualized secondary to bowel gas.  Enlarged fatty liver.  Pancreas not visualized secondary to bowel gas.  Critical Value/emergent results were called by telephone at the time of interpretation on 11/26/2011 at 5:40 p.m. to Dr. Rosalia Hammers.   Original Report Authenticated By: Lacy Duverney, M.D.     Anti-infectives: Anti-infectives     Start     Dose/Rate Route Frequency Ordered Stop   11/26/11 2300   metroNIDAZOLE (FLAGYL) IVPB 500 mg  Status:  Discontinued        500 mg 100 mL/hr over 60 Minutes Intravenous Every 8 hours 11/26/11 2105 11/27/11 1351   11/26/11 2200   levofloxacin (LEVAQUIN) IVPB 500 mg  Status:  Discontinued        500 mg 100 mL/hr over 60 Minutes Intravenous Every 24 hours 11/26/11 2105 11/27/11 1351   11/26/11 1815   cefOXitin (MEFOXIN) 2 g in dextrose 5 % 50 mL IVPB  Status:  Discontinued        2 g 100 mL/hr over 30 Minutes Intravenous 4 times per day 11/26/11 1815 11/26/11 2105  Assessment/Plan: s/p Procedure(s) (LRB) with comments: LAPAROSCOPIC CHOLECYSTECTOMY (N/A) Overall patient is doing better. Reassured patient and encouraged her to increase activity. As patient has had better response in the past apparently 2 oxycodone will change her Norco as to her home Percocet dosage. If patient continues to improve today possible discharge later today but fully anticipate discharge tomorrow  LOS: 2 days    Aleaha Fickling C 11/28/2011

## 2011-11-28 NOTE — Progress Notes (Signed)
Discharge instructions given to patient with no questions. Prescriptions for pain called into [facility] per Dr. Leticia Penna. Patient taken out of facility by staff via wheelchair. Patient left with family.

## 2011-12-01 ENCOUNTER — Encounter (HOSPITAL_COMMUNITY): Payer: Self-pay | Admitting: General Surgery

## 2011-12-16 NOTE — Discharge Summary (Signed)
Physician Discharge Summary  Patient ID: [name] MRN: [id number] DOB/AGE: September 22, 1970 41 y.o.  Admit date: 11/26/2011 Discharge date: 11/28/2011  Admission Diagnoses: Acute cholecystitis  Discharge Diagnoses: Same Active Problems:  * No active hospital problems. *    Discharged Condition: stable  Hospital Course: Patient presented to [facility] with severe right upper quadrant abdominal pain. Workup and evaluation was consistent for acute cholecystitis cholelithiasis. Patient was placed on antibiotics and IV fluid hydration. She is taken to the operating room and tolerated a laparoscopic cholecystectomy. She was advanced on her diet following the operation. As her pain was controlled she was ambulatory and tolerating a regular diet patient was made ready for discharge.  Consults: None  Significant Diagnostic Studies: CT evaluation abdomen pelvis  Treatments: IV hydration and surgery: Laparoscopic cholecystectomy  Discharge Exam: Blood pressure 104/67, pulse 76, temperature 98.4 F (36.9 C), temperature source Oral, resp. rate 18, height 5\' 2"  (1.575 m), weight 92.5 kg (203 lb 14.8 oz), last menstrual period 11/23/2011, SpO2 95.00%. General appearance: alert and no distress Resp: clear to auscultation bilaterally Cardio: regular rate and rhythm GI: Positive bowel sounds, soft, flat, expected postoperative tenderness. Incisions are clean dry and intact. Steri-Strips are in place.  Disposition: 01-Home or Self Care  Discharge Orders    Future Orders Please Complete By Expires   Diet - low sodium heart healthy      Increase activity slowly      Discharge instructions      Comments:   Increase activity as tolerated. May place ice pack for comfort.  Alternate an anti-inflammatory such as ibuprofen (Motrin, Advil) 400-600mg  every 6 hours with the prescribed pain medication.   Do not take any additional acetaminophen as there is Tylenol in the pain medication.   Driving Restrictions      Comments:   No driving while on pain medications.   Lifting restrictions      Comments:   No lifting over 20lbs for 4-5 weeks post-op.   Discharge wound care:      Comments:   Clean surgical sites with soap and water.  May shower the morning after surgery unless instructed by Dr. Leticia Penna otherwise.  No soaking for 2-3 weeks.    If adhesive strips are in place, they may be removed in 1-2 weeks while in the shower.   Call MD for:  temperature >100.4      Call MD for:  persistant nausea and vomiting      Call MD for:  severe uncontrolled pain      Call MD for:  redness, tenderness, or signs of infection (pain, swelling, redness, odor or green/yellow discharge around incision site)      Call MD for:  difficulty breathing, headache or visual disturbances          Medication List     As of 12/16/2011  8:49 AM    TAKE these medications         albuterol 108 (90 BASE) MCG/ACT inhaler   Commonly known as: PROVENTIL HFA;VENTOLIN HFA   Inhale 2 puffs into the lungs every 6 (six) hours as needed. For shortness of breath      ALPRAZolam 1 MG tablet   Commonly known as: XANAX   Take 2 mg by mouth 3 (three) times daily.      citalopram 20 MG tablet   Commonly known as: CELEXA   Take 20 mg by mouth daily.      gabapentin 300 MG capsule  Commonly known as: NEURONTIN   Take 300 mg by mouth 2 (two) times daily.      lisinopril-hydrochlorothiazide 20-25 MG per tablet   Commonly known as: PRINZIDE,ZESTORETIC   Take 0.5 tablets by mouth daily.      meloxicam 15 MG tablet   Commonly known as: MOBIC   Take 15 mg by mouth daily.      methocarbamol 750 MG tablet   Commonly known as: ROBAXIN   Take 750 mg by mouth 4 (four) times daily.      oxyCODONE-acetaminophen 10-325 MG per tablet   Commonly known as: PERCOCET   Take 1 tablet by mouth 4 (four) times daily as needed. For pain      pantoprazole 40 MG tablet   Commonly known as: PROTONIX   Take 40 mg by  mouth 2 (two) times daily.      ranitidine 150 MG capsule   Commonly known as: ZANTAC   Take 1 capsule (150 mg total) by mouth 2 (two) times daily.      temazepam 30 MG capsule   Commonly known as: RESTORIL   Take 60 mg by mouth at bedtime as needed. For sleep           Follow-up Information    Follow up with Tramain Gershman C, MD. In 3 weeks.   Contact information:   [address] [place] [address] [phone]          Signed: Fabio Bering 12/16/2011, 8:49 AM

## 2011-12-27 ENCOUNTER — Encounter (HOSPITAL_COMMUNITY): Payer: Self-pay | Admitting: Emergency Medicine

## 2011-12-27 ENCOUNTER — Emergency Department (HOSPITAL_COMMUNITY)
Admission: EM | Admit: 2011-12-27 | Discharge: 2011-12-27 | Disposition: A | Discharge status: Home / Self Care | Payer: Medicaid Other | Attending: Emergency Medicine | Admitting: Emergency Medicine

## 2011-12-27 DIAGNOSIS — F410 Panic disorder [episodic paroxysmal anxiety] without agoraphobia: Secondary | ICD-10-CM | POA: Insufficient documentation

## 2011-12-27 DIAGNOSIS — F3289 Other specified depressive episodes: Secondary | ICD-10-CM | POA: Insufficient documentation

## 2011-12-27 DIAGNOSIS — M545 Low back pain, unspecified: Secondary | ICD-10-CM | POA: Insufficient documentation

## 2011-12-27 DIAGNOSIS — F329 Major depressive disorder, single episode, unspecified: Secondary | ICD-10-CM | POA: Insufficient documentation

## 2011-12-27 DIAGNOSIS — Z9889 Other specified postprocedural states: Secondary | ICD-10-CM | POA: Insufficient documentation

## 2011-12-27 DIAGNOSIS — Z79899 Other long term (current) drug therapy: Secondary | ICD-10-CM | POA: Insufficient documentation

## 2011-12-27 DIAGNOSIS — F17200 Nicotine dependence, unspecified, uncomplicated: Secondary | ICD-10-CM | POA: Insufficient documentation

## 2011-12-27 DIAGNOSIS — M4120 Other idiopathic scoliosis, site unspecified: Secondary | ICD-10-CM | POA: Insufficient documentation

## 2011-12-27 DIAGNOSIS — I10 Essential (primary) hypertension: Secondary | ICD-10-CM | POA: Insufficient documentation

## 2011-12-27 DIAGNOSIS — M549 Dorsalgia, unspecified: Secondary | ICD-10-CM

## 2011-12-27 MED ORDER — BACLOFEN 10 MG PO TABS: 10.0000 mg | ORAL_TABLET | Freq: Three times a day (TID) | ORAL | Status: AC

## 2011-12-27 MED ORDER — HYDROCODONE-ACETAMINOPHEN 5-325 MG PO TABS: ORAL_TABLET | ORAL | Status: DC

## 2011-12-27 NOTE — ED Provider Notes (Signed)
History     CSN: [id number]  Arrival date & time 12/27/11  1720   None     Chief Complaint  Patient presents with  . Back Pain    (Consider location/radiation/quality/duration/timing/severity/associated sxs/prior treatment) Patient is a 41 y.o. female presenting with back pain. The history is provided by the patient.  Back Pain  This is a new problem. The current episode started 2 days ago. The problem occurs constantly. The pain is associated with lifting heavy objects. The pain is present in the lumbar spine. The pain is at a severity of 10/10. The symptoms are aggravated by certain positions. The pain is worse during the day.    Past Medical History  Diagnosis Date  . Scoliosis   . Hypertension   . Panic attack   . Back pain   . Depression   . Anxiety   . Ulcer     Past Surgical History  Procedure Date  . Back surgery   . Cesarean section   . Neck surgery   . Tubal ligation   . Cholecystectomy 11/27/2011    Procedure: LAPAROSCOPIC CHOLECYSTECTOMY;  Surgeon: Fabio Bering, MD;  Location: [facility];  Service: General;  Laterality: N/A;    History reviewed. No pertinent family history.  History  Substance Use Topics  . Smoking status: Current Every Day Smoker -- 2.0 packs/day    Types: Cigarettes  . Smokeless tobacco: Not on file  . Alcohol Use: No    OB History    Grav Para Term Preterm Abortions TAB SAB Ect Mult Living                  Review of Systems  Musculoskeletal: Positive for back pain.    Allergies  Penicillins; Ibuprofen; and Aspirin  Home Medications   Current Outpatient Rx  Name  Route  Sig  Dispense  Refill  . ALPRAZOLAM 1 MG PO TABS   Oral   Take 2 mg by mouth 3 (three) times daily.          Marland Kitchen CITALOPRAM HYDROBROMIDE 20 MG PO TABS   Oral   Take 20 mg by mouth daily.          Marland Kitchen GABAPENTIN 300 MG PO CAPS   Oral   Take 300 mg by mouth 2 (two) times daily.          Marland Kitchen LISINOPRIL-HYDROCHLOROTHIAZIDE 20-25 MG PO TABS  Oral   Take 0.5 tablets by mouth daily.          . MELOXICAM 15 MG PO TABS   Oral   Take 15 mg by mouth daily.          Marland Kitchen METHOCARBAMOL 750 MG PO TABS   Oral   Take 750 mg by mouth 4 (four) times daily.           Marland Kitchen PANTOPRAZOLE SODIUM 40 MG PO TBEC   Oral   Take 40 mg by mouth 2 (two) times daily.          Marland Kitchen TEMAZEPAM 30 MG PO CAPS   Oral   Take 60 mg by mouth at bedtime as needed. For sleep         . ALBUTEROL SULFATE HFA 108 (90 BASE) MCG/ACT IN AERS   Inhalation   Inhale 2 puffs into the lungs every 6 (six) hours as needed. For shortness of breath           BP 140/88  Pulse 98  Temp 97.9 F (36.6  C) (Oral)  Resp 20  Ht 5\' 2"  (1.575 m)  Wt 203 lb (92.08 kg)  BMI 37.13 kg/m2  SpO2 99%  LMP 12/27/2011  Physical Exam  Nursing note and vitals reviewed. Constitutional: She is oriented to person, place, and time. She appears well-developed and well-nourished.  Non-toxic appearance.  HENT:  Head: Normocephalic.  Right Ear: Tympanic membrane and external ear normal.  Left Ear: Tympanic membrane and external ear normal.  Eyes: EOM and lids are normal. Pupils are equal, round, and reactive to light.  Neck: Normal range of motion. Neck supple. Carotid bruit is not present.  Cardiovascular: Normal rate, regular rhythm, normal heart sounds, intact distal pulses and normal pulses.   Pulmonary/Chest: Breath sounds normal. No respiratory distress.  Abdominal: Soft. Bowel sounds are normal. There is no tenderness. There is no guarding.  Musculoskeletal: Normal range of motion.       Pt has pain and spasm of the paraspinal area of the lumbar spine. Pain with attempted ROM. No hot areas noted.  Lymphadenopathy:       Head (right side): No submandibular adenopathy present.       Head (left side): No submandibular adenopathy present.    She has no cervical adenopathy.  Neurological: She is alert and oriented to person, place, and time. She has normal strength. No  cranial nerve deficit or sensory deficit.       Mild decrease in sensation of the left posterior calf, this is not new. There is mild weakness of plantar flexion bilat, this is not new.  Skin: Skin is warm and dry.  Psychiatric: She has a normal mood and affect. Her speech is normal.    ED Course  Procedures (including critical care time)  Labs Reviewed - No data to display No results found.   No diagnosis found.    MDM  I have reviewed nursing notes, vital signs, and all appropriate lab and imaging results for this patient. Patient has history of scoliosis and chronic low back pain. Patient has had surgery previously. The patient was changing a tire 2 days ago and reinjured her lower back. No new gross neurologic deficits appreciated on today's examination. The patient will be treated with baclofen 10 mg 3 times daily and Norco one or 2 tablets every 4 hours #20 tablets. The patient is to see her primary physician for recheck and evaluation next week.       Kathie Dike, [place] 12/27/11 Ernestina [place]

## 2011-12-27 NOTE — ED Notes (Signed)
Pt c/o back pain after changing flat tire Friday.

## 2011-12-28 NOTE — ED Provider Notes (Signed)
Medical screening examination/treatment/procedure(s) were performed by non-physician practitioner and as supervising physician I was immediately available for consultation/collaboration.   Fernande Treiber M Mariely Mahr, MD 12/28/11 2036 

## 2012-01-24 ENCOUNTER — Emergency Department (HOSPITAL_COMMUNITY)
Admission: EM | Admit: 2012-01-24 | Discharge: 2012-01-25 | Disposition: A | Discharge status: Home / Self Care | Payer: Medicaid Other | Attending: Emergency Medicine | Admitting: Emergency Medicine

## 2012-01-24 ENCOUNTER — Encounter (HOSPITAL_COMMUNITY): Payer: Self-pay

## 2012-01-24 DIAGNOSIS — IMO0001 Reserved for inherently not codable concepts without codable children: Secondary | ICD-10-CM | POA: Insufficient documentation

## 2012-01-24 DIAGNOSIS — Z8719 Personal history of other diseases of the digestive system: Secondary | ICD-10-CM | POA: Insufficient documentation

## 2012-01-24 DIAGNOSIS — R059 Cough, unspecified: Secondary | ICD-10-CM | POA: Insufficient documentation

## 2012-01-24 DIAGNOSIS — J3489 Other specified disorders of nose and nasal sinuses: Secondary | ICD-10-CM | POA: Insufficient documentation

## 2012-01-24 DIAGNOSIS — F411 Generalized anxiety disorder: Secondary | ICD-10-CM | POA: Insufficient documentation

## 2012-01-24 DIAGNOSIS — I10 Essential (primary) hypertension: Secondary | ICD-10-CM | POA: Insufficient documentation

## 2012-01-24 DIAGNOSIS — Z79899 Other long term (current) drug therapy: Secondary | ICD-10-CM | POA: Insufficient documentation

## 2012-01-24 DIAGNOSIS — M549 Dorsalgia, unspecified: Secondary | ICD-10-CM | POA: Insufficient documentation

## 2012-01-24 DIAGNOSIS — F3289 Other specified depressive episodes: Secondary | ICD-10-CM | POA: Insufficient documentation

## 2012-01-24 DIAGNOSIS — R112 Nausea with vomiting, unspecified: Secondary | ICD-10-CM | POA: Insufficient documentation

## 2012-01-24 DIAGNOSIS — R05 Cough: Secondary | ICD-10-CM

## 2012-01-24 DIAGNOSIS — F329 Major depressive disorder, single episode, unspecified: Secondary | ICD-10-CM | POA: Insufficient documentation

## 2012-01-24 DIAGNOSIS — Z90710 Acquired absence of both cervix and uterus: Secondary | ICD-10-CM | POA: Insufficient documentation

## 2012-01-24 DIAGNOSIS — Z9889 Other specified postprocedural states: Secondary | ICD-10-CM | POA: Insufficient documentation

## 2012-01-24 DIAGNOSIS — F17200 Nicotine dependence, unspecified, uncomplicated: Secondary | ICD-10-CM | POA: Insufficient documentation

## 2012-01-24 DIAGNOSIS — G8929 Other chronic pain: Secondary | ICD-10-CM | POA: Insufficient documentation

## 2012-01-24 DIAGNOSIS — R197 Diarrhea, unspecified: Secondary | ICD-10-CM | POA: Insufficient documentation

## 2012-01-24 NOTE — ED Notes (Signed)
Vomiting, diarrhea, cough, congestion, body aches.

## 2012-01-25 ENCOUNTER — Emergency Department (HOSPITAL_COMMUNITY): Payer: Medicaid Other

## 2012-01-25 LAB — POCT I-STAT, CHEM 8
BUN: 12 mg/dL (ref 6–23)
Hemoglobin: 16.3 g/dL — ABNORMAL HIGH (ref 12.0–15.0)
Potassium: 3.4 mEq/L — ABNORMAL LOW (ref 3.5–5.1)
Sodium: 141 mEq/L (ref 135–145)
TCO2: 24 mmol/L (ref 0–100)

## 2012-01-25 MED ORDER — ONDANSETRON 4 MG PO TBDP: 4.0000 mg | ORAL_TABLET | Freq: Three times a day (TID) | ORAL | Status: DC | PRN

## 2012-01-25 MED ORDER — KETOROLAC TROMETHAMINE 60 MG/2ML IM SOLN
60.0000 mg | Freq: Once | INTRAMUSCULAR | Status: AC
  Administered 2012-01-25: 60 mg via INTRAMUSCULAR
  Filled 2012-01-25: qty 2

## 2012-01-25 MED ORDER — DIPHENOXYLATE-ATROPINE 2.5-0.025 MG PO TABS: 1.0000 | ORAL_TABLET | Freq: Four times a day (QID) | ORAL | Status: DC | PRN

## 2012-01-25 MED ORDER — BENZONATATE 100 MG PO CAPS: 100.0000 mg | ORAL_CAPSULE | Freq: Three times a day (TID) | ORAL | Status: DC

## 2012-01-25 NOTE — ED Provider Notes (Signed)
History     CSN: [id number]  Arrival date & time 01/24/12  2309   First MD Initiated Contact with Patient 01/25/12 0019      Chief Complaint  Patient presents with  . Cough  . Nasal Congestion  . Emesis  . Diarrhea    (Consider location/radiation/quality/duration/timing/severity/associated sxs/prior treatment) HPI Comments: 42 year old female with a history of hypertension, chronic back pain, scoliosis who presents with a complaint of coughing. She states that she has been coughing for 10 days, nothing seems to make it better or worse, it has been persistent, associated with mild mucous production occasionally but otherwise has been dry. She has had nausea and vomiting but describes the vomiting is posttussive emesis and has had watery diarrhea for the last 3 days. She has eaten a chicken dinner this evening complete with green beans without difficulty. She has no other sick contacts, has not been traveling and has not been on recent antibiotics.  The history is provided by the patient, a relative and medical records.    Past Medical History  Diagnosis Date  . Scoliosis   . Hypertension   . Panic attack   . Back pain   . Depression   . Anxiety   . Ulcer     Past Surgical History  Procedure Date  . Back surgery   . Cesarean section   . Neck surgery   . Tubal ligation   . Cholecystectomy 11/27/2011    Procedure: LAPAROSCOPIC CHOLECYSTECTOMY;  Surgeon: Fabio Bering, MD;  Location: [facility];  Service: General;  Laterality: N/A;    No family history on file.  History  Substance Use Topics  . Smoking status: Current Every Day Smoker -- 2.0 packs/day    Types: Cigarettes  . Smokeless tobacco: Not on file  . Alcohol Use: No    OB History    Grav Para Term Preterm Abortions TAB SAB Ect Mult Living                  Review of Systems  All other systems reviewed and are negative.    Allergies  Penicillins; Ibuprofen; and Aspirin  Home Medications   Current  Outpatient Rx  Name  Route  Sig  Dispense  Refill  . ALBUTEROL SULFATE HFA 108 (90 BASE) MCG/ACT IN AERS   Inhalation   Inhale 2 puffs into the lungs every 6 (six) hours as needed. For shortness of breath         . ALPRAZOLAM 1 MG PO TABS   Oral   Take 2 mg by mouth 3 (three) times daily.          Marland Kitchen CITALOPRAM HYDROBROMIDE 20 MG PO TABS   Oral   Take 20 mg by mouth daily.          Marland Kitchen GABAPENTIN 300 MG PO CAPS   Oral   Take 300 mg by mouth 2 (two) times daily.          Marland Kitchen LISINOPRIL-HYDROCHLOROTHIAZIDE 20-25 MG PO TABS   Oral   Take 0.5 tablets by mouth daily.          . MELOXICAM 15 MG PO TABS   Oral   Take 15 mg by mouth daily.          Marland Kitchen METHOCARBAMOL 750 MG PO TABS   Oral   Take 750 mg by mouth 4 (four) times daily.           . OXYCODONE-ACETAMINOPHEN 10-325 MG PO TABS  Oral   Take 1 tablet by mouth every 4 (four) hours as needed.         Marland Kitchen PANTOPRAZOLE SODIUM 40 MG PO TBEC   Oral   Take 40 mg by mouth 2 (two) times daily.          Marland Kitchen TEMAZEPAM 30 MG PO CAPS   Oral   Take 60 mg by mouth at bedtime as needed. For sleep         . BACLOFEN 10 MG PO TABS   Oral   Take 1 tablet (10 mg total) by mouth 3 (three) times daily.   21 each   0   . BENZONATATE 100 MG PO CAPS   Oral   Take 1 capsule (100 mg total) by mouth every 8 (eight) hours.   21 capsule   0   . DIPHENOXYLATE-ATROPINE 2.5-0.025 MG PO TABS   Oral   Take 1 tablet by mouth 4 (four) times daily as needed for diarrhea or loose stools.   30 tablet   0   . HYDROCODONE-ACETAMINOPHEN 5-325 MG PO TABS      1 or 2 po q4h prn pain   20 tablet   0   . ONDANSETRON 4 MG PO TBDP   Oral   Take 1 tablet (4 mg total) by mouth every 8 (eight) hours as needed for nausea.   10 tablet   0     BP 137/89  Pulse 94  Temp 98.2 F (36.8 C) (Oral)  Resp 20  Ht 5\' 2"  (1.575 m)  Wt 200 lb (90.719 kg)  BMI 36.58 kg/m2  SpO2 97%  LMP 12/23/2011  Physical Exam  Nursing note and vitals  reviewed. Constitutional: She appears well-developed and well-nourished. No distress.  HENT:  Head: Normocephalic and atraumatic.  Mouth/Throat: Oropharynx is clear and moist. No oropharyngeal exudate.  Eyes: Conjunctivae normal and EOM are normal. Pupils are equal, round, and reactive to light. Right eye exhibits no discharge. Left eye exhibits no discharge. No scleral icterus.  Neck: Normal range of motion. Neck supple. No JVD present. No thyromegaly present.  Cardiovascular: Normal rate, regular rhythm, normal heart sounds and intact distal pulses.  Exam reveals no gallop and no friction rub.   No murmur heard. Pulmonary/Chest: Effort normal and breath sounds normal. No respiratory distress. She has no wheezes. She has no rales.  Abdominal: Soft. Bowel sounds are normal. She exhibits no distension and no mass. There is no tenderness.  Musculoskeletal: Normal range of motion. She exhibits no edema and no tenderness.  Lymphadenopathy:    She has no cervical adenopathy.  Neurological: She is alert. Coordination normal.  Skin: Skin is warm and dry. No rash noted. No erythema.  Psychiatric: She has a normal mood and affect. Her behavior is normal.    ED Course  Procedures (including critical care time)  Labs Reviewed  POCT I-STAT, CHEM 8 - Abnormal; Notable for the following:    Potassium 3.4 (*)     Glucose, Bld 117 (*)     Hemoglobin 16.3 (*)     HCT 48.0 (*)     All other components within normal limits   Dg Chest 2 View  01/25/2012  *RADIOLOGY REPORT*  Clinical Data: Cough, congestion.  CHEST - 2 VIEW  Comparison: 06/07/2011  Findings: Cervical fixation hardware partially seen.  Stable perihilar and bibasilar interstitial prominence.  No confluent airspace infiltrate. No overt edema.  No effusion.  Heart size normal.  IMPRESSION:  1.  Stable chest.   Original Report Authenticated By: D. Andria Rhein, MD      1. Cough   2. Diarrhea       MDM  The patient has a nontender  abdomen, clear heart and lung sounds, no respiratory distress and is speaking in full sentences. The oropharynx is clear, mucous membranes are moist and there is no rashes. Will obtain a basic embolic panel to evaluate potassium and renal function given the patient's dehydration from diarrhea and we'll check a chest x-ray to evaluate more fully the cough as the patient is a smoker and has had a subacute cough. Otherwise the patient is nontoxic in appearance.  Labs show K of 3.4, no other acute findings, CXR without acute findings - likely viral process, meds given for d/c.  Pt stable adn ambulatory on d/c.        Vida Roller, MD 01/25/12 [phone]

## 2012-01-25 NOTE — ED Notes (Signed)
Pt back from x-ray.

## 2012-03-19 ENCOUNTER — Encounter (HOSPITAL_COMMUNITY): Payer: Self-pay | Admitting: *Deleted

## 2012-03-19 ENCOUNTER — Emergency Department (HOSPITAL_COMMUNITY)
Admission: EM | Admit: 2012-03-19 | Discharge: 2012-03-19 | Disposition: A | Discharge status: Home / Self Care | Payer: Medicaid Other | Attending: Emergency Medicine | Admitting: Emergency Medicine

## 2012-03-19 DIAGNOSIS — F411 Generalized anxiety disorder: Secondary | ICD-10-CM | POA: Insufficient documentation

## 2012-03-19 DIAGNOSIS — Z79899 Other long term (current) drug therapy: Secondary | ICD-10-CM | POA: Insufficient documentation

## 2012-03-19 DIAGNOSIS — F329 Major depressive disorder, single episode, unspecified: Secondary | ICD-10-CM | POA: Insufficient documentation

## 2012-03-19 DIAGNOSIS — G8929 Other chronic pain: Secondary | ICD-10-CM

## 2012-03-19 DIAGNOSIS — W010XXA Fall on same level from slipping, tripping and stumbling without subsequent striking against object, initial encounter: Secondary | ICD-10-CM | POA: Insufficient documentation

## 2012-03-19 DIAGNOSIS — Z9889 Other specified postprocedural states: Secondary | ICD-10-CM | POA: Insufficient documentation

## 2012-03-19 DIAGNOSIS — F3289 Other specified depressive episodes: Secondary | ICD-10-CM | POA: Insufficient documentation

## 2012-03-19 DIAGNOSIS — Y9329 Activity, other involving ice and snow: Secondary | ICD-10-CM | POA: Insufficient documentation

## 2012-03-19 DIAGNOSIS — R52 Pain, unspecified: Secondary | ICD-10-CM | POA: Insufficient documentation

## 2012-03-19 DIAGNOSIS — M542 Cervicalgia: Secondary | ICD-10-CM | POA: Insufficient documentation

## 2012-03-19 DIAGNOSIS — Z872 Personal history of diseases of the skin and subcutaneous tissue: Secondary | ICD-10-CM | POA: Insufficient documentation

## 2012-03-19 DIAGNOSIS — I10 Essential (primary) hypertension: Secondary | ICD-10-CM | POA: Insufficient documentation

## 2012-03-19 DIAGNOSIS — Z8739 Personal history of other diseases of the musculoskeletal system and connective tissue: Secondary | ICD-10-CM | POA: Insufficient documentation

## 2012-03-19 DIAGNOSIS — F17200 Nicotine dependence, unspecified, uncomplicated: Secondary | ICD-10-CM | POA: Insufficient documentation

## 2012-03-19 DIAGNOSIS — Y9289 Other specified places as the place of occurrence of the external cause: Secondary | ICD-10-CM | POA: Insufficient documentation

## 2012-03-19 DIAGNOSIS — IMO0002 Reserved for concepts with insufficient information to code with codable children: Secondary | ICD-10-CM | POA: Insufficient documentation

## 2012-03-19 MED ORDER — HYDROMORPHONE HCL PF 1 MG/ML IJ SOLN
1.0000 mg | Freq: Once | INTRAMUSCULAR | Status: AC
  Administered 2012-03-19: 1 mg via INTRAMUSCULAR
  Filled 2012-03-19: qty 1

## 2012-03-19 NOTE — ED Notes (Signed)
Pt c/o fell on ice this am, c/o pain to neck pain that radiates down spinal column and down bilateral legs. Ambulatory to triage with no distress noted. Has been taking percocets 7.5mg  at home with no improvement in pain. Pt uses percocets for chronic back pain.

## 2012-03-19 NOTE — ED Provider Notes (Signed)
History     CSN: [id number]  Arrival date & time 03/19/12  1506   First MD Initiated Contact with Patient 03/19/12 1644      Chief Complaint  Patient presents with  . Fall    (Consider location/radiation/quality/duration/timing/severity/associated sxs/prior treatment) HPI Comments: [name] is a 42 y.o. Female presenting with acute on chronic low back and neck pain after slipping on ice this morning and landing in slush on her knees.  She has a history of multiple lumbar and cervical surgeries with persistent chronic pain which is being treated by Dr. Sudie Bailey, her pcp.  She denies any new numbness or tingling and has had no loss of control of bowel or bladder, or urinary or bowel retention.  She used to see [facility],  But did not get along,  So Dr. Sudie Bailey is currently treating her chronic pain.  She states she is scheduled to see him in 3 days for a  1 month f/u and medication refills.  She has taken her mobic, robaxin and several doses of her percocet today with no significant improvement in symptoms.     The history is provided by the patient.    Past Medical History  Diagnosis Date  . Scoliosis   . Hypertension   . Panic attack   . Back pain   . Depression   . Anxiety   . Ulcer     Past Surgical History  Procedure Laterality Date  . Back surgery    . Cesarean section    . Neck surgery    . Tubal ligation    . Cholecystectomy  11/27/2011    Procedure: LAPAROSCOPIC CHOLECYSTECTOMY;  Surgeon: Fabio Bering, MD;  Location: [facility];  Service: General;  Laterality: N/A;    No family history on file.  History  Substance Use Topics  . Smoking status: Current Every Day Smoker -- 2.00 packs/day    Types: Cigarettes  . Smokeless tobacco: Not on file  . Alcohol Use: No    OB History   Grav Para Term Preterm Abortions TAB SAB Ect Mult Living                  Review of Systems  Constitutional: Negative for fever.  HENT: Positive for neck  pain.   Respiratory: Negative for shortness of breath.   Cardiovascular: Negative for chest pain and leg swelling.  Gastrointestinal: Negative for abdominal pain, constipation and abdominal distention.  Genitourinary: Negative for dysuria, urgency, frequency, flank pain and difficulty urinating.  Musculoskeletal: Positive for back pain. Negative for joint swelling and gait problem.  Skin: Negative for rash.  Neurological: Negative for weakness and numbness.    Allergies  Penicillins; Ibuprofen; and Aspirin  Home Medications   Current Outpatient Rx  Name  Route  Sig  Dispense  Refill  . albuterol (PROVENTIL HFA;VENTOLIN HFA) 108 (90 BASE) MCG/ACT inhaler   Inhalation   Inhale 2 puffs into the lungs every 6 (six) hours as needed. For shortness of breath         . ALPRAZolam (XANAX) 1 MG tablet   Oral   Take 2 mg by mouth 3 (three) times daily.          . citalopram (CELEXA) 20 MG tablet   Oral   Take 20 mg by mouth daily.          Marland Kitchen gabapentin (NEURONTIN) 300 MG capsule   Oral   Take 300 mg by mouth 2 (two) times  daily.          . lisinopril-hydrochlorothiazide (PRINZIDE,ZESTORETIC) 20-25 MG per tablet   Oral   Take 0.5 tablets by mouth daily.          . meloxicam (MOBIC) 15 MG tablet   Oral   Take 15 mg by mouth daily.          . methocarbamol (ROBAXIN) 750 MG tablet   Oral   Take 750 mg by mouth 4 (four) times daily.           Marland Kitchen oxyCODONE-acetaminophen (PERCOCET) 7.5-325 MG per tablet   Oral   Take 1 tablet by mouth 3 (three) times daily as needed for pain.         . pantoprazole (PROTONIX) 40 MG tablet   Oral   Take 40 mg by mouth 2 (two) times daily.          . temazepam (RESTORIL) 30 MG capsule   Oral   Take 60 mg by mouth at bedtime as needed. For sleep           BP 131/89  Pulse 98  Temp(Src) 98.2 F (36.8 C) (Oral)  Resp 20  Ht 5\' 2"  (1.575 m)  Wt 180 lb (81.647 kg)  BMI 32.91 kg/m2  SpO2 97%  LMP 03/17/2012  Physical  Exam  Nursing note and vitals reviewed. Constitutional: She appears well-developed and well-nourished.  HENT:  Head: Normocephalic.  Eyes: Conjunctivae are normal.  Neck: Normal range of motion. Neck supple.  Cardiovascular: Normal rate and intact distal pulses.   Pedal pulses normal.  Pulmonary/Chest: Effort normal.  Abdominal: Soft. Bowel sounds are normal. She exhibits no distension and no mass.  Musculoskeletal: Normal range of motion. She exhibits tenderness. She exhibits no edema.       Lumbar back: She exhibits tenderness. She exhibits no swelling, no edema and no spasm.  Paralumbar ttp with no midline pain.  Well healed surgical incision.  She has paracervical muscle tenderness and spasm.  Equal grip strength.  Knees with no visible evidence of trauma, no ecchymosis or edema.  Neurological: She is alert. She has normal strength. She displays no atrophy and no tremor. No sensory deficit. Gait normal.  Reflex Scores:      Patellar reflexes are 2+ on the right side and 2+ on the left side.      Achilles reflexes are 2+ on the right side and 2+ on the left side. No strength deficit noted in hip and knee flexor and extensor muscle groups.  Ankle flexion and extension intact.  Skin: Skin is warm and dry.  Psychiatric: She has a normal mood and affect.    ED Course  Procedures (including critical care time)  Labs Reviewed - No data to display No results found.   1. Chronic back pain       MDM  Patient with acute on chronic back pain after fall this am with no midline pain,  No indication for xrays today.  She was given an IM injection of dilaudid 1 mg. Encouraged to continue her other home medicines which includes robaxin,  Oxycodone and mobic.  To see pcp in 4 days.  No neuro deficit on exam or by history to suggest emergent or surgical presentation.  Also discussed worsened sx that should prompt immediate re-evaluation including distal weakness, bowel/bladder  retention/incontinence.              [name], PA-C 03/20/12 0020

## 2012-03-21 ENCOUNTER — Encounter (HOSPITAL_COMMUNITY): Payer: Self-pay | Admitting: *Deleted

## 2012-03-21 ENCOUNTER — Emergency Department (HOSPITAL_COMMUNITY)
Admission: EM | Admit: 2012-03-21 | Discharge: 2012-03-21 | Disposition: A | Discharge status: Home / Self Care | Payer: Medicaid Other | Attending: Emergency Medicine | Admitting: Emergency Medicine

## 2012-03-21 DIAGNOSIS — F17200 Nicotine dependence, unspecified, uncomplicated: Secondary | ICD-10-CM | POA: Insufficient documentation

## 2012-03-21 DIAGNOSIS — I10 Essential (primary) hypertension: Secondary | ICD-10-CM | POA: Insufficient documentation

## 2012-03-21 DIAGNOSIS — Z8719 Personal history of other diseases of the digestive system: Secondary | ICD-10-CM | POA: Insufficient documentation

## 2012-03-21 DIAGNOSIS — F329 Major depressive disorder, single episode, unspecified: Secondary | ICD-10-CM | POA: Insufficient documentation

## 2012-03-21 DIAGNOSIS — F3289 Other specified depressive episodes: Secondary | ICD-10-CM | POA: Insufficient documentation

## 2012-03-21 DIAGNOSIS — S8990XA Unspecified injury of unspecified lower leg, initial encounter: Secondary | ICD-10-CM | POA: Insufficient documentation

## 2012-03-21 DIAGNOSIS — M25561 Pain in right knee: Secondary | ICD-10-CM

## 2012-03-21 DIAGNOSIS — Z79899 Other long term (current) drug therapy: Secondary | ICD-10-CM | POA: Insufficient documentation

## 2012-03-21 DIAGNOSIS — Y939 Activity, unspecified: Secondary | ICD-10-CM | POA: Insufficient documentation

## 2012-03-21 DIAGNOSIS — Z8739 Personal history of other diseases of the musculoskeletal system and connective tissue: Secondary | ICD-10-CM | POA: Insufficient documentation

## 2012-03-21 DIAGNOSIS — W010XXA Fall on same level from slipping, tripping and stumbling without subsequent striking against object, initial encounter: Secondary | ICD-10-CM | POA: Insufficient documentation

## 2012-03-21 DIAGNOSIS — Y929 Unspecified place or not applicable: Secondary | ICD-10-CM | POA: Insufficient documentation

## 2012-03-21 DIAGNOSIS — Z791 Long term (current) use of non-steroidal anti-inflammatories (NSAID): Secondary | ICD-10-CM | POA: Insufficient documentation

## 2012-03-21 DIAGNOSIS — F411 Generalized anxiety disorder: Secondary | ICD-10-CM | POA: Insufficient documentation

## 2012-03-21 MED ORDER — OXYCODONE-ACETAMINOPHEN 5-325 MG PO TABS: 1.0000 | ORAL_TABLET | ORAL | Status: DC | PRN

## 2012-03-21 NOTE — ED Notes (Signed)
Fell on Saturday, on ice, seen here for same.  Pain both knees,and legs.

## 2012-03-21 NOTE — ED Provider Notes (Signed)
History     CSN: [id number]  Arrival date & time 03/21/12  1155   First MD Initiated Contact with Patient 03/21/12 1305      Chief Complaint  Patient presents with  . Fall    (Consider location/radiation/quality/duration/timing/severity/associated sxs/prior treatment) HPI Comments: Patient seen here previously after a fall on ice.  Returns c/o continued pain to her knees and has now ran out of her narcotic pain medication.  States that she has an appt with her PMD in 2 days, but had to "double up" on her percocet due to her level of pain.  States that her doctor was prescribing her enough pills for 4 times a day but has recently tried to decrease her to 3 times a day and she states that she has been having to take more.  She denies swelling, erythema, fever or numbness or weakness of the extremities,  Patient is a 42 y.o. female presenting with knee pain. The history is provided by the patient.  Knee Pain Location:  Knee Injury: yes   Mechanism of injury: fall   Fall:    Fall occurred: slipped on ice.   Impact surface:  Dirt   Point of impact:  Knees   Entrapped after fall: no   Knee location:  L knee and R knee Pain details:    Quality:  Aching   Radiates to:  Does not radiate   Severity:  Mild   Onset quality:  Sudden   Timing:  Constant   Progression:  Unchanged Chronicity:  Chronic Dislocation: no   Foreign body present:  No foreign bodies Prior injury to area: prior hx of chronic knee pain. Worsened by:  Activity and bearing weight Ineffective treatments:  None tried Associated symptoms: no back pain, no decreased ROM, no fever, no neck pain, no numbness, no stiffness, no swelling and no tingling     Past Medical History  Diagnosis Date  . Scoliosis   . Hypertension   . Panic attack   . Back pain   . Depression   . Anxiety   . Ulcer     Past Surgical History  Procedure Laterality Date  . Back surgery    . Cesarean section    . Neck surgery    . Tubal  ligation    . Cholecystectomy  11/27/2011    Procedure: LAPAROSCOPIC CHOLECYSTECTOMY;  Surgeon: Fabio Bering, MD;  Location: [facility];  Service: General;  Laterality: N/A;    History reviewed. No pertinent family history.  History  Substance Use Topics  . Smoking status: Current Every Day Smoker -- 2.00 packs/day    Types: Cigarettes  . Smokeless tobacco: Not on file  . Alcohol Use: No    OB History   Grav Para Term Preterm Abortions TAB SAB Ect Mult Living                  Review of Systems  Constitutional: Negative for fever and chills.  HENT: Negative for neck pain.   Genitourinary: Negative for dysuria and difficulty urinating.  Musculoskeletal: Positive for arthralgias. Negative for back pain, joint swelling, gait problem and stiffness.  Skin: Negative for color change and wound.  All other systems reviewed and are negative.    Allergies  Penicillins; Ibuprofen; and Aspirin  Home Medications   Current Outpatient Rx  Name  Route  Sig  Dispense  Refill  . albuterol (PROVENTIL HFA;VENTOLIN HFA) 108 (90 BASE) MCG/ACT inhaler   Inhalation   Inhale 2  puffs into the lungs every 6 (six) hours as needed. For shortness of breath         . ALPRAZolam (XANAX) 1 MG tablet   Oral   Take 2 mg by mouth 3 (three) times daily.          . citalopram (CELEXA) 20 MG tablet   Oral   Take 20 mg by mouth daily.          Marland Kitchen gabapentin (NEURONTIN) 300 MG capsule   Oral   Take 300 mg by mouth 2 (two) times daily.          Marland Kitchen lisinopril-hydrochlorothiazide (PRINZIDE,ZESTORETIC) 20-25 MG per tablet   Oral   Take 0.5 tablets by mouth daily.          . meloxicam (MOBIC) 15 MG tablet   Oral   Take 15 mg by mouth daily.          . methocarbamol (ROBAXIN) 750 MG tablet   Oral   Take 750 mg by mouth 4 (four) times daily.           Marland Kitchen oxyCODONE-acetaminophen (PERCOCET) 7.5-325 MG per tablet   Oral   Take 1 tablet by mouth 3 (three) times daily as needed for pain.          . pantoprazole (PROTONIX) 40 MG tablet   Oral   Take 40 mg by mouth 2 (two) times daily.          . temazepam (RESTORIL) 30 MG capsule   Oral   Take 60 mg by mouth at bedtime. For sleep           BP 119/79  Pulse 89  Temp(Src) 97.4 F (36.3 C) (Oral)  Resp 18  Ht 5\' 2"  (1.575 m)  Wt 180 lb (81.647 kg)  BMI 32.91 kg/m2  SpO2 98%  LMP 03/17/2012  Physical Exam  Nursing note and vitals reviewed. Constitutional: She is oriented to person, place, and time. She appears well-developed and well-nourished. No distress.  Cardiovascular: Normal rate, regular rhythm, normal heart sounds and intact distal pulses.   Pulmonary/Chest: Effort normal and breath sounds normal.  Musculoskeletal: She exhibits tenderness. She exhibits no edema.  Diffuse ttp of the anterior bilateral knees.  Mild crepitus on extension.  No erythema, excessive warmth, bruising or bony  deformity.  No proximal tenderness.  Pt has full ROM of the bilateral knees,  DP pulses are brisk and symmetrical, distal sensation intact  Neurological: She is alert and oriented to person, place, and time. She exhibits normal muscle tone. Coordination normal.  Skin: Skin is warm and dry. No erythema.    ED Course  Procedures (including critical care time)  Labs Reviewed - No data to display No results found.     1. Knee pain, bilateral       MDM   Previous ED chart viewed by me.    Pt with hx of chronic knee pain and requesting refill of her pain medication.  She has ttp of the bilat anterior knee w/o erythema, effusion of deformity.  She is ambulatory with a steady gait.  I doubt fx, or infectious process.  Admits to "doubling up" on her prescribed pain medications.     I Consulted Dr. Sudie Bailey.  Recommended giving pt  #6 percocet and he will  See her in his office this wednesday   The patient appears reasonably screened and/or stabilized for discharge and I doubt any other medical condition or other [facility]  requiring further  screening, evaluation, or treatment in the ED at this time prior to discharge.    Tammy L. Trisha Mangle, PA-C 03/23/12 1641

## 2012-03-22 NOTE — ED Provider Notes (Signed)
Medical screening examination/treatment/procedure(s) were performed by non-physician practitioner and as supervising physician I was immediately available for consultation/collaboration.  Shelda Jakes, MD 03/22/12 1154

## 2012-03-24 NOTE — ED Provider Notes (Signed)
Medical screening examination/treatment/procedure(s) were performed by non-physician practitioner and as supervising physician I was immediately available for consultation/collaboration. Clarke Peretz, MD, FACEP   Nelva Hauk L Ellowyn Rieves, MD 03/24/12 0023 

## 2012-04-06 MED FILL — Oxycodone w/ Acetaminophen Tab 5-325 MG: ORAL | Qty: 6 | Status: AC

## 2012-05-09 ENCOUNTER — Encounter (HOSPITAL_COMMUNITY): Payer: Self-pay

## 2012-05-09 ENCOUNTER — Emergency Department (HOSPITAL_COMMUNITY)
Admission: EM | Admit: 2012-05-09 | Discharge: 2012-05-09 | Disposition: A | Discharge status: Home / Self Care | Payer: Medicaid Other | Attending: Emergency Medicine | Admitting: Emergency Medicine

## 2012-05-09 DIAGNOSIS — M549 Dorsalgia, unspecified: Secondary | ICD-10-CM

## 2012-05-09 DIAGNOSIS — M25569 Pain in unspecified knee: Secondary | ICD-10-CM | POA: Insufficient documentation

## 2012-05-09 DIAGNOSIS — F411 Generalized anxiety disorder: Secondary | ICD-10-CM | POA: Insufficient documentation

## 2012-05-09 DIAGNOSIS — M545 Low back pain, unspecified: Secondary | ICD-10-CM | POA: Insufficient documentation

## 2012-05-09 DIAGNOSIS — Z79899 Other long term (current) drug therapy: Secondary | ICD-10-CM | POA: Insufficient documentation

## 2012-05-09 DIAGNOSIS — R109 Unspecified abdominal pain: Secondary | ICD-10-CM | POA: Insufficient documentation

## 2012-05-09 DIAGNOSIS — Z8739 Personal history of other diseases of the musculoskeletal system and connective tissue: Secondary | ICD-10-CM | POA: Insufficient documentation

## 2012-05-09 DIAGNOSIS — I10 Essential (primary) hypertension: Secondary | ICD-10-CM | POA: Insufficient documentation

## 2012-05-09 DIAGNOSIS — F17200 Nicotine dependence, unspecified, uncomplicated: Secondary | ICD-10-CM | POA: Insufficient documentation

## 2012-05-09 DIAGNOSIS — Z880 Allergy status to penicillin: Secondary | ICD-10-CM | POA: Insufficient documentation

## 2012-05-09 DIAGNOSIS — Z872 Personal history of diseases of the skin and subcutaneous tissue: Secondary | ICD-10-CM | POA: Insufficient documentation

## 2012-05-09 MED ORDER — MORPHINE SULFATE 4 MG/ML IJ SOLN
8.0000 mg | Freq: Once | INTRAMUSCULAR | Status: AC
  Administered 2012-05-09: 8 mg via INTRAMUSCULAR
  Filled 2012-05-09: qty 2

## 2012-05-09 MED ORDER — ONDANSETRON HCL 4 MG PO TABS
4.0000 mg | ORAL_TABLET | Freq: Once | ORAL | Status: AC
  Administered 2012-05-09: 4 mg via ORAL
  Filled 2012-05-09: qty 1

## 2012-05-09 MED ORDER — DEXAMETHASONE SODIUM PHOSPHATE 4 MG/ML IJ SOLN
8.0000 mg | Freq: Once | INTRAMUSCULAR | Status: AC
  Administered 2012-05-09: 8 mg via INTRAMUSCULAR
  Filled 2012-05-09: qty 2

## 2012-05-09 NOTE — ED Provider Notes (Signed)
History     CSN: [id number]  Arrival date & time 05/09/12  1021   First MD Initiated Contact with Patient 05/09/12 1107      Chief Complaint  Patient presents with  . Back Pain  . Knee Pain    (Consider location/radiation/quality/duration/timing/severity/associated sxs/prior treatment) Patient is a 42 y.o. female presenting with back pain and knee pain. The history is provided by the patient.  Back Pain Location:  Lumbar spine Quality:  Aching Pain severity:  Moderate Pain is:  Worse during the day Onset quality:  Sudden Timing:  Constant Progression:  Worsening Chronicity:  Chronic Relieved by:  Nothing Worsened by:  Movement Ineffective treatments:  None tried Associated symptoms: abdominal pain   Associated symptoms: no bladder incontinence, no bowel incontinence, no chest pain, no dysuria, no fever and no tingling   Knee Pain Associated symptoms: back pain   Associated symptoms: no fever and no neck pain     Past Medical History  Diagnosis Date  . Scoliosis   . Hypertension   . Panic attack   . Back pain   . Depression   . Anxiety   . Ulcer     Past Surgical History  Procedure Laterality Date  . Back surgery    . Cesarean section    . Neck surgery    . Tubal ligation    . Cholecystectomy  11/27/2011    Procedure: LAPAROSCOPIC CHOLECYSTECTOMY;  Surgeon: Fabio Bering, MD;  Location: [facility];  Service: General;  Laterality: N/A;    No family history on file.  History  Substance Use Topics  . Smoking status: Current Every Day Smoker -- 2.00 packs/day    Types: Cigarettes  . Smokeless tobacco: Not on file  . Alcohol Use: No    OB History   Grav Para Term Preterm Abortions TAB SAB Ect Mult Living                  Review of Systems  Constitutional: Negative for fever and activity change.       All ROS Neg except as noted in HPI  HENT: Negative for nosebleeds and neck pain.   Eyes: Negative for photophobia and discharge.  Respiratory:  Negative for cough, shortness of breath and wheezing.   Cardiovascular: Negative for chest pain and palpitations.  Gastrointestinal: Positive for abdominal pain. Negative for blood in stool and bowel incontinence.  Genitourinary: Negative for bladder incontinence, dysuria, frequency and hematuria.  Musculoskeletal: Positive for back pain. Negative for arthralgias.  Skin: Negative.   Neurological: Negative for dizziness, tingling, seizures and speech difficulty.  Psychiatric/Behavioral: Negative for hallucinations and confusion. The patient is nervous/anxious.     Allergies  Penicillins; Ibuprofen; and Aspirin  Home Medications   Current Outpatient Rx  Name  Route  Sig  Dispense  Refill  . albuterol (PROVENTIL HFA;VENTOLIN HFA) 108 (90 BASE) MCG/ACT inhaler   Inhalation   Inhale 2 puffs into the lungs every 6 (six) hours as needed. For shortness of breath         . ALPRAZolam (XANAX) 1 MG tablet   Oral   Take 2 mg by mouth 3 (three) times daily.          . citalopram (CELEXA) 20 MG tablet   Oral   Take 20 mg by mouth daily.          Marland Kitchen gabapentin (NEURONTIN) 300 MG capsule   Oral   Take 300 mg by mouth 2 (two) times daily.          Marland Kitchen  lisinopril-hydrochlorothiazide (PRINZIDE,ZESTORETIC) 20-25 MG per tablet   Oral   Take 0.5 tablets by mouth daily.          . meloxicam (MOBIC) 15 MG tablet   Oral   Take 15 mg by mouth daily.          . methocarbamol (ROBAXIN) 750 MG tablet   Oral   Take 750 mg by mouth 4 (four) times daily.           Marland Kitchen oxyCODONE-acetaminophen (PERCOCET) 7.5-325 MG per tablet   Oral   Take 1 tablet by mouth 3 (three) times daily as needed for pain.         . pantoprazole (PROTONIX) 40 MG tablet   Oral   Take 40 mg by mouth 2 (two) times daily.          . temazepam (RESTORIL) 30 MG capsule   Oral   Take 60 mg by mouth at bedtime. For sleep           BP 130/69  Pulse 85  Temp(Src) 97.7 F (36.5 C) (Oral)  Resp 18  Ht 5\' 2"   (1.575 m)  Wt 180 lb (81.647 kg)  BMI 32.91 kg/m2  SpO2 100%  Physical Exam  Nursing note and vitals reviewed. Constitutional: She is oriented to person, place, and time. She appears well-developed and well-nourished.  Non-toxic appearance.  HENT:  Head: Normocephalic.  Right Ear: Tympanic membrane and external ear normal.  Left Ear: Tympanic membrane and external ear normal.  Eyes: EOM and lids are normal. Pupils are equal, round, and reactive to light.  Neck: Normal range of motion. Neck supple. Carotid bruit is not present.  Cardiovascular: Normal rate, regular rhythm, normal heart sounds, intact distal pulses and normal pulses.   Pulmonary/Chest: Breath sounds normal. No respiratory distress.  Abdominal: Soft. Bowel sounds are normal. There is no tenderness. There is no guarding.  Musculoskeletal: Normal range of motion.  There is lower lumbar area tenderness to palpation and with attempted range of motion. There no hot areas appreciated. There is no palpable step off appreciated. There is paraspinal area of soreness as well.  Lymphadenopathy:       Head (right side): No submandibular adenopathy present.       Head (left side): No submandibular adenopathy present.    She has no cervical adenopathy.  Neurological: She is alert and oriented to person, place, and time. She has normal strength. No cranial nerve deficit or sensory deficit.  Skin: Skin is warm and dry.  Psychiatric: She has a normal mood and affect. Her speech is normal.    ED Course  Procedures (including critical care time)  Labs Reviewed - No data to display No results found.   No diagnosis found.    MDM  I have reviewed nursing notes, vital signs, and all appropriate lab and imaging results for this patient. Patient has a history of chronic back pain. She is scheduled to see orthopedics and pain management within the next week to 2 weeks. The patient presented to the emergency department to get a" shot" to  help her other medications because she is having pain in her back and knees. There is no gross neurologic deficits appreciated. The patient is ambulatory but slowly with some discomfort.  Patient was treated in the emergency department with intramuscular morphine and Decadron. Is also given Zofran with some improvement in her pain. The patient is to follow with her primary care person as well as her specialist for  additional evaluation and management of this ongoing back problem.       Kathie Dike, PA-C 05/10/12 [id number]

## 2012-05-09 NOTE — ED Notes (Signed)
Pt reports chronic back pain, pain has been worse over the weekend. Has taken her percocet and it has not helped. She comes to er and she "gets a shot", also having bil knee pain, denies any recent injury to back or knees.

## 2012-05-09 NOTE — ED Notes (Signed)
Patient with no complaints at this time. Respirations even and unlabored. Skin warm/dry. Discharge instructions reviewed with patient at this time. Patient given opportunity to voice concerns/ask questions. Patient discharged at this time and left Emergency Department with steady gait.   

## 2012-05-10 NOTE — ED Provider Notes (Signed)
Medical screening examination/treatment/procedure(s) were performed by non-physician practitioner and as supervising physician I was immediately available for consultation/collaboration.   Dione Booze, MD 05/10/12 1500

## 2012-05-18 ENCOUNTER — Ambulatory Visit: Payer: Medicaid Other | Admitting: Orthopedic Surgery

## 2012-05-31 ENCOUNTER — Ambulatory Visit (INDEPENDENT_AMBULATORY_CARE_PROVIDER_SITE_OTHER): Payer: Medicaid Other

## 2012-05-31 ENCOUNTER — Encounter: Payer: Self-pay | Admitting: Orthopedic Surgery

## 2012-05-31 ENCOUNTER — Ambulatory Visit (INDEPENDENT_AMBULATORY_CARE_PROVIDER_SITE_OTHER): Payer: Medicaid Other | Admitting: Orthopedic Surgery

## 2012-05-31 VITALS — BP 124/82 | Ht 62.0 in | Wt 206.0 lb

## 2012-05-31 DIAGNOSIS — M25362 Other instability, left knee: Secondary | ICD-10-CM | POA: Insufficient documentation

## 2012-05-31 DIAGNOSIS — M25561 Pain in right knee: Secondary | ICD-10-CM

## 2012-05-31 DIAGNOSIS — M25361 Other instability, right knee: Secondary | ICD-10-CM

## 2012-05-31 DIAGNOSIS — Q682 Congenital deformity of knee: Secondary | ICD-10-CM | POA: Insufficient documentation

## 2012-05-31 DIAGNOSIS — M25569 Pain in unspecified knee: Secondary | ICD-10-CM

## 2012-05-31 DIAGNOSIS — M25869 Other specified joint disorders, unspecified knee: Secondary | ICD-10-CM

## 2012-05-31 NOTE — Progress Notes (Signed)
Patient ID: [name], female   DOB: 25-Feb-1970, 42 y.o.   MRN: [id number] Chief Complaint  Patient presents with  . Knee Pain    Right knee pain, referral from Dr. Sudie Bailey.    History 42 year-old female presents with long history of knee pain which started when she was about 42 years old. She complains of sharp throbbing 7/10 constant pain throughout the day and into the night sometimes improved with heat worse with walking standing bending associated with catching and swelling no recent trauma  Patient presents with history of multiple back surgeries  She reports review of systems as fatigue blurred vision and eye pain with watering of the eyes snoring heartburn she also reports numbness and tingling with dizziness nervousness, anxiety and depression easy bruising, excessive thirst, seasonal allergies she denies chest pain frequency or healing or rash  The past, family history and social history have been reviewed and are recorded above  BP 124/82  Ht 5\' 2"  (1.575 m)  Wt 206 lb (93.441 kg)  BMI 37.67 kg/m2 General appearance is normal, the patient is alert and oriented x3 with normal mood and affect. Body habitus mesomorphic ambulation is normal  Her lower extremities show hyperextension of both knees slightly tilting patella full range of motion subluxation of the patella with manual pressure bilaterally collaterals and cruciates stable exam normal skin intact in both legs pulses are good bilaterally sensation normal bilaterally  Right knee x-rays show no major abnormalities  She is centered in the patellar groove by 45 knee flexion  Impression Encounter Diagnoses  Name Primary?  . Knee pain, right   . Congenital patella maltracking Yes  . Patellar instability of both knees     In physical therapy program no surgical intervention at her age and with her pathology

## 2012-05-31 NOTE — Patient Instructions (Signed)
Patella Problems (Patellofemoral Syndrome)  This syndrome is caused by changes in the undersurface of the kneecap (patella). The changes vary from minor inflammation to major changes such as breakdown of the cartilage on the undersurface of the patella. The major changes can be seen with an arthroscope (a small, pencil-sized telescope). These changes can result from various factors. These factors may arise from abnormal tracking (movement or malalignment) of the patella. Normally the Patella is in its normal groove located between the condyles (grooved end) of the femur (thigh bone). Abnormal movement leads to increased pressure in the patellofemoral joint. This leads to swelling in the cartilage, inflammation and pain.  SYMPTOMS   The patient with this syndrome usually has an ache in the knee. It is often aggravated by:  · Prolonged sitting.  · Squatting.  · Climbing stairs.  · Running down hill.  · Other exercising that stresses the knee.  Other findings may include the knee giving way, swelling, and or locking.  TREATMENT   The treatment will depend on the cause of the problem. Sometimes the solution is as simple as cutting down on activities. Giving your joint a rest with the use of crutches and braces can also help. This is generally followed by strengthening exercises.  RECOVERY  Recovery from a patellar problem depends on the type of problem in your knee and on the treatment required. If conservative treatment works the recovery period may be as little as three to four weeks. If more aggressive therapy such as surgery is required, the recovery period may be several months. Your caregiver will discuss this with you.  HOME CARE INSTRUCTIONS  · Following exercise, use an ice pack for twenty to thirty minutes three to four times per day. Use a towel between your ice pack and the skin.  · Reduction of inflammation with anti-inflammatories may be helpful. Only take over-the-counter or prescription medicines for  pain, discomfort, or fever as directed by your caregiver.  · Taping the knee or using a neoprene sleeve with a patellar cutout to provide better tracking of the patella may give relief.  · Muscle (quadriceps) strengthening exercises are helpful. Follow your caregiver's advice.  · Muscle stretching prior to exercise may be helpful.  · Soft tissue therapy using ultrasound, and diathermy may be helpful.  · If conservative therapy is not effective, surgery may provide relief. During arthroscopy, your caregiver may discover a rough surface beneath your kneecap. If this happens, your caregiver may smooth this out by shaving the surface.  SEEK MEDICAL CARE IF:  If you have surgery, see your caregiver if:  · There is increased bleeding or clear fluid (more than a small spot) from the wound.  · You notice redness, swelling, or increasing pain in the wound.  · Pus is coming from wound.  · You develop an unexplained oral temperature above 102° F (38.9° C) develops, or as your caregiver suggests.  · You notice a foul smell coming from the wound or dressing.  · You develop increasing pain or stiffness in your knee.  SEEK IMMEDIATE MEDICAL CARE IF:   · You develop a rash.  · You have difficulty breathing.  · You have any allergic problems.  MAKE SURE YOU:   · Understand these instructions.  · Will watch your condition.  · Will get help right away if you are not doing well or get worse.  Document Released: 12/27/1999 Document Revised: 03/23/2011 Document Reviewed: 01/16/2008  [employer]® Patient Information ©2013 [employer].

## 2012-07-11 ENCOUNTER — Emergency Department (HOSPITAL_COMMUNITY): Payer: Medicaid Other

## 2012-07-11 ENCOUNTER — Encounter (HOSPITAL_COMMUNITY): Payer: Self-pay | Admitting: *Deleted

## 2012-07-11 DIAGNOSIS — R059 Cough, unspecified: Secondary | ICD-10-CM | POA: Insufficient documentation

## 2012-07-11 DIAGNOSIS — M549 Dorsalgia, unspecified: Secondary | ICD-10-CM | POA: Insufficient documentation

## 2012-07-11 DIAGNOSIS — Z8739 Personal history of other diseases of the musculoskeletal system and connective tissue: Secondary | ICD-10-CM | POA: Insufficient documentation

## 2012-07-11 DIAGNOSIS — Z79899 Other long term (current) drug therapy: Secondary | ICD-10-CM | POA: Insufficient documentation

## 2012-07-11 DIAGNOSIS — I10 Essential (primary) hypertension: Secondary | ICD-10-CM | POA: Insufficient documentation

## 2012-07-11 DIAGNOSIS — F410 Panic disorder [episodic paroxysmal anxiety] without agoraphobia: Secondary | ICD-10-CM | POA: Insufficient documentation

## 2012-07-11 DIAGNOSIS — Z872 Personal history of diseases of the skin and subcutaneous tissue: Secondary | ICD-10-CM | POA: Insufficient documentation

## 2012-07-11 DIAGNOSIS — R071 Chest pain on breathing: Secondary | ICD-10-CM | POA: Insufficient documentation

## 2012-07-11 DIAGNOSIS — F17200 Nicotine dependence, unspecified, uncomplicated: Secondary | ICD-10-CM | POA: Insufficient documentation

## 2012-07-11 DIAGNOSIS — Z880 Allergy status to penicillin: Secondary | ICD-10-CM | POA: Insufficient documentation

## 2012-07-11 DIAGNOSIS — F3289 Other specified depressive episodes: Secondary | ICD-10-CM | POA: Insufficient documentation

## 2012-07-11 DIAGNOSIS — R05 Cough: Secondary | ICD-10-CM | POA: Insufficient documentation

## 2012-07-11 DIAGNOSIS — F329 Major depressive disorder, single episode, unspecified: Secondary | ICD-10-CM | POA: Insufficient documentation

## 2012-07-11 NOTE — ED Notes (Signed)
Chest pain , upper back pain, cough nonproductive.

## 2012-07-12 ENCOUNTER — Emergency Department (HOSPITAL_COMMUNITY)
Admission: EM | Admit: 2012-07-12 | Discharge: 2012-07-12 | Disposition: A | Discharge status: Home / Self Care | Payer: Medicaid Other | Attending: Emergency Medicine | Admitting: Emergency Medicine

## 2012-07-12 DIAGNOSIS — R0789 Other chest pain: Secondary | ICD-10-CM

## 2012-07-12 MED ORDER — ALBUTEROL SULFATE (5 MG/ML) 0.5% IN NEBU
5.0000 mg | INHALATION_SOLUTION | Freq: Once | RESPIRATORY_TRACT | Status: AC
Start: 2012-07-12 — End: 2012-07-12
  Administered 2012-07-12: 5 mg via RESPIRATORY_TRACT
  Filled 2012-07-12: qty 1

## 2012-07-12 MED ORDER — PREDNISONE 50 MG PO TABS
60.0000 mg | ORAL_TABLET | Freq: Once | ORAL | Status: AC
  Administered 2012-07-12: 60 mg via ORAL
  Filled 2012-07-12: qty 1

## 2012-07-12 MED ORDER — PREDNISONE 10 MG PO TABS: 20.0000 mg | ORAL_TABLET | Freq: Every day | ORAL | Status: DC

## 2012-07-12 MED ORDER — HYDROCODONE-ACETAMINOPHEN 5-325 MG PO TABS
1.0000 | ORAL_TABLET | Freq: Once | ORAL | Status: AC
  Administered 2012-07-12: 1 via ORAL
  Filled 2012-07-12: qty 1

## 2012-07-12 MED ORDER — IPRATROPIUM BROMIDE 0.02 % IN SOLN
0.5000 mg | Freq: Once | RESPIRATORY_TRACT | Status: AC
  Administered 2012-07-12: 0.5 mg via RESPIRATORY_TRACT
  Filled 2012-07-12: qty 2.5

## 2012-07-12 NOTE — ED Provider Notes (Signed)
History    CSN: [id number] Arrival date & time 07/11/12  2308  First MD Initiated Contact with Patient 07/12/12 0104     Chief Complaint  Patient presents with  . Chest Pain   (Consider location/radiation/quality/duration/timing/severity/associated sxs/prior Treatment) HPI HPI Comments: [name] is a 42 y.o. female who presents to the Emergency Department complaining of back pain that radiates around to her chest. She has had a cough, non productive for several days. Every time she takes a breath the pain is there. It has become more sharp over the last day. Denies fever, chills, nausea, vomiting.  PCP Dr. Sudie Bailey   Past Medical History  Diagnosis Date  . Scoliosis   . Hypertension   . Panic attack   . Back pain   . Depression   . Anxiety   . Ulcer    Past Surgical History  Procedure Laterality Date  . Back surgery    . Cesarean section    . Neck surgery    . Tubal ligation    . Cholecystectomy  11/27/2011    Procedure: LAPAROSCOPIC CHOLECYSTECTOMY;  Surgeon: Fabio Bering, MD;  Location: [facility];  Service: General;  Laterality: N/A;   Family History  Problem Relation Age of Onset  . Heart disease    . Arthritis    . Lung disease    . Cancer    . Asthma    . Diabetes     History  Substance Use Topics  . Smoking status: Current Every Day Smoker -- 2.00 packs/day    Types: Cigarettes  . Smokeless tobacco: Not on file  . Alcohol Use: No   OB History   Grav Para Term Preterm Abortions TAB SAB Ect Mult Living                 Review of Systems  Constitutional: Negative for fever.       10 Systems reviewed and are negative for acute change except as noted in the HPI.  HENT: Negative for congestion.   Eyes: Negative for discharge and redness.  Respiratory: Negative for cough and shortness of breath.        Sharp chest pain with breathing.  Cardiovascular: Negative for chest pain.  Gastrointestinal: Negative for vomiting and abdominal pain.   Musculoskeletal: Negative for back pain.  Skin: Negative for rash.  Neurological: Negative for syncope, numbness and headaches.  Psychiatric/Behavioral:       No behavior change.    Allergies  Penicillins; Ibuprofen; and Aspirin  Home Medications   Current Outpatient Rx  Name  Route  Sig  Dispense  Refill  . albuterol (PROVENTIL HFA;VENTOLIN HFA) 108 (90 BASE) MCG/ACT inhaler   Inhalation   Inhale 2 puffs into the lungs every 6 (six) hours as needed. For shortness of breath         . ALPRAZolam (XANAX) 1 MG tablet   Oral   Take 2 mg by mouth 3 (three) times daily.          . citalopram (CELEXA) 20 MG tablet   Oral   Take 20 mg by mouth daily.          Marland Kitchen gabapentin (NEURONTIN) 300 MG capsule   Oral   Take 300 mg by mouth 2 (two) times daily.          Marland Kitchen lisinopril-hydrochlorothiazide (PRINZIDE,ZESTORETIC) 20-25 MG per tablet   Oral   Take 0.5 tablets by mouth daily.          Marland Kitchen  meloxicam (MOBIC) 15 MG tablet   Oral   Take 15 mg by mouth daily.          . methocarbamol (ROBAXIN) 750 MG tablet   Oral   Take 750 mg by mouth 4 (four) times daily.           Marland Kitchen oxyCODONE-acetaminophen (PERCOCET) 7.5-325 MG per tablet   Oral   Take 1 tablet by mouth 3 (three) times daily as needed for pain.         . pantoprazole (PROTONIX) 40 MG tablet   Oral   Take 40 mg by mouth 2 (two) times daily.          . temazepam (RESTORIL) 30 MG capsule   Oral   Take 60 mg by mouth at bedtime. For sleep          BP 117/67  Pulse 67  Temp(Src) 98.1 F (36.7 C) (Oral)  Resp 18  Ht 5\' 2"  (1.575 m)  Wt 204 lb 4.8 oz (92.67 kg)  BMI 37.36 kg/m2  SpO2 98%  LMP 06/28/2012 Physical Exam  Nursing note and vitals reviewed. Constitutional: She appears well-developed and well-nourished.  Awake, alert, nontoxic appearance.  HENT:  Head: Normocephalic and atraumatic.  Eyes: EOM are normal. Pupils are equal, round, and reactive to light.  Neck: Normal range of motion.  Neck supple.  Cardiovascular: Normal rate and intact distal pulses.   Pulmonary/Chest: Effort normal and breath sounds normal. She exhibits no tenderness.  Occasional end expiratory wheeze  Abdominal: Soft. There is no tenderness. There is no rebound.  Musculoskeletal: She exhibits no tenderness.  Baseline ROM, no obvious new focal weakness.  Neurological:  Mental status and motor strength appears baseline for patient and situation.  Skin: No rash noted.  Psychiatric: She has a normal mood and affect.    ED Course  Procedures (including critical care time) Results for orders placed during the hospital encounter of 07/12/12  D-DIMER, QUANTITATIVE      Result Value Range   D-Dimer, Quant <0.27  0.00 - 0.48 ug/mL-FEU   Dg Chest 2 View  07/11/2012   *RADIOLOGY REPORT*  Clinical Data: Chest pain for 3 days.  No injury.  CHEST - 2 VIEW  Comparison: 01/25/2012.  Findings:  Cardiopericardial silhouette within normal limits. Mediastinal contours normal. Trachea midline.  No airspace disease or effusion.  Partially visualized cervical fusion hardware.  No interval change from prior.  IMPRESSION: No active cardiopulmonary disease.  No interval change.   Original Report Authenticated By: Andreas Newport, M.D.   Medications  albuterol (PROVENTIL) (5 MG/ML) 0.5% nebulizer solution 5 mg (5 mg Nebulization Given 07/12/12 0144)  ipratropium (ATROVENT) nebulizer solution 0.5 mg (0.5 mg Nebulization Given 07/12/12 0144)  predniSONE (DELTASONE) tablet 60 mg (60 mg Oral Given 07/12/12 0138)  HYDROcodone-acetaminophen (NORCO/VICODIN) 5-325 MG per tablet 1 tablet (1 tablet Oral Given 07/12/12 0138)    MDM  Patient presents with pain with deep breathing. D-dimer is negative. Chest xray is negative. Given albuterol nebulizer with some relief. Given prednisone and hydrocodone. Reviewed results with patient. Pt stable in ED with no significant deterioration in condition.The patient appears reasonably screened and/or  stabilized for discharge and I doubt any other medical condition or other [facility] requiring further screening, evaluation, or treatment in the ED at this time prior to discharge.  MDM Reviewed: nursing note and vitals Interpretation: labs and x-ray     Nicoletta Dress. Colon Branch, MD 07/12/12 4098

## 2012-10-02 ENCOUNTER — Emergency Department (HOSPITAL_COMMUNITY): Payer: Medicaid Other

## 2012-10-02 ENCOUNTER — Emergency Department (HOSPITAL_COMMUNITY)
Admission: EM | Admit: 2012-10-02 | Discharge: 2012-10-02 | Disposition: A | Discharge status: Home / Self Care | Payer: Medicaid Other | Attending: Emergency Medicine | Admitting: Emergency Medicine

## 2012-10-02 ENCOUNTER — Encounter (HOSPITAL_COMMUNITY): Payer: Self-pay | Admitting: *Deleted

## 2012-10-02 DIAGNOSIS — F329 Major depressive disorder, single episode, unspecified: Secondary | ICD-10-CM | POA: Insufficient documentation

## 2012-10-02 DIAGNOSIS — F17200 Nicotine dependence, unspecified, uncomplicated: Secondary | ICD-10-CM | POA: Insufficient documentation

## 2012-10-02 DIAGNOSIS — Z8739 Personal history of other diseases of the musculoskeletal system and connective tissue: Secondary | ICD-10-CM | POA: Insufficient documentation

## 2012-10-02 DIAGNOSIS — Z791 Long term (current) use of non-steroidal anti-inflammatories (NSAID): Secondary | ICD-10-CM | POA: Insufficient documentation

## 2012-10-02 DIAGNOSIS — F3289 Other specified depressive episodes: Secondary | ICD-10-CM | POA: Insufficient documentation

## 2012-10-02 DIAGNOSIS — F410 Panic disorder [episodic paroxysmal anxiety] without agoraphobia: Secondary | ICD-10-CM | POA: Insufficient documentation

## 2012-10-02 DIAGNOSIS — K2970 Gastritis, unspecified, without bleeding: Secondary | ICD-10-CM | POA: Insufficient documentation

## 2012-10-02 DIAGNOSIS — I10 Essential (primary) hypertension: Secondary | ICD-10-CM | POA: Insufficient documentation

## 2012-10-02 DIAGNOSIS — Z79899 Other long term (current) drug therapy: Secondary | ICD-10-CM | POA: Insufficient documentation

## 2012-10-02 DIAGNOSIS — Z872 Personal history of diseases of the skin and subcutaneous tissue: Secondary | ICD-10-CM | POA: Insufficient documentation

## 2012-10-02 DIAGNOSIS — Z880 Allergy status to penicillin: Secondary | ICD-10-CM | POA: Insufficient documentation

## 2012-10-02 LAB — COMPREHENSIVE METABOLIC PANEL
AST: 10 U/L (ref 0–37)
Albumin: 4 g/dL (ref 3.5–5.2)
CO2: 24 mEq/L (ref 19–32)
Calcium: 9.9 mg/dL (ref 8.4–10.5)
Creatinine, Ser: 1.21 mg/dL — ABNORMAL HIGH (ref 0.50–1.10)
GFR calc non Af Amer: 55 mL/min — ABNORMAL LOW (ref >90)
Sodium: 138 mEq/L (ref 135–145)
Total Protein: 7.9 g/dL (ref 6.0–8.3)

## 2012-10-02 LAB — CBC WITH DIFFERENTIAL/PLATELET
Basophils Absolute: 0 10*3/uL (ref 0.0–0.1)
Basophils Relative: 0 % (ref 0–1)
Eosinophils Absolute: 0.1 10*3/uL (ref 0.0–0.7)
Eosinophils Relative: 1 % (ref 0–5)
HCT: 45.8 % (ref 36.0–46.0)
MCH: 33.9 pg (ref 26.0–34.0)
MCHC: 35.6 g/dL (ref 30.0–36.0)
MCV: 95.2 fL (ref 78.0–100.0)
Monocytes Absolute: 1.1 10*3/uL — ABNORMAL HIGH (ref 0.1–1.0)
Platelets: 304 10*3/uL (ref 150–400)
RDW: 13 % (ref 11.5–15.5)

## 2012-10-02 MED ORDER — PANTOPRAZOLE SODIUM 40 MG IV SOLR
40.0000 mg | Freq: Once | INTRAVENOUS | Status: AC
  Administered 2012-10-02: 40 mg via INTRAVENOUS
  Filled 2012-10-02: qty 40

## 2012-10-02 MED ORDER — HYDROMORPHONE HCL PF 1 MG/ML IJ SOLN
0.5000 mg | Freq: Once | INTRAMUSCULAR | Status: AC
  Administered 2012-10-02: 0.5 mg via INTRAVENOUS
  Filled 2012-10-02: qty 1

## 2012-10-02 MED ORDER — OXYCODONE-ACETAMINOPHEN 5-325 MG PO TABS: 1.0000 | ORAL_TABLET | Freq: Four times a day (QID) | ORAL | Status: DC | PRN

## 2012-10-02 MED ORDER — OXYCODONE-ACETAMINOPHEN 5-325 MG PO TABS
1.0000 | ORAL_TABLET | Freq: Once | ORAL | Status: AC
  Administered 2012-10-02: 1 via ORAL
  Filled 2012-10-02: qty 1

## 2012-10-02 MED ORDER — PROMETHAZINE HCL 25 MG PO TABS: 25.0000 mg | ORAL_TABLET | Freq: Four times a day (QID) | ORAL | Status: DC | PRN

## 2012-10-02 MED ORDER — IOHEXOL 300 MG/ML  SOLN
100.0000 mL | Freq: Once | INTRAMUSCULAR | Status: AC | PRN
  Administered 2012-10-02: 100 mL via INTRAVENOUS

## 2012-10-02 MED ORDER — SUCRALFATE 1 G PO TABS: 1.0000 g | ORAL_TABLET | Freq: Four times a day (QID) | ORAL | Status: DC

## 2012-10-02 MED ORDER — IOHEXOL 300 MG/ML  SOLN
50.0000 mL | Freq: Once | INTRAMUSCULAR | Status: AC | PRN
  Administered 2012-10-02: 50 mL via ORAL

## 2012-10-02 MED ORDER — LORAZEPAM 2 MG/ML IJ SOLN
1.0000 mg | Freq: Once | INTRAMUSCULAR | Status: AC
  Administered 2012-10-02: 1 mg via INTRAVENOUS
  Filled 2012-10-02: qty 1

## 2012-10-02 MED ORDER — HYDROMORPHONE HCL PF 1 MG/ML IJ SOLN
1.0000 mg | Freq: Once | INTRAMUSCULAR | Status: AC
  Administered 2012-10-02: 1 mg via INTRAVENOUS
  Filled 2012-10-02: qty 1

## 2012-10-02 MED ORDER — TAMSULOSIN HCL 0.4 MG PO CAPS: 0.4000 mg | ORAL_CAPSULE | Freq: Every day | ORAL | Status: DC

## 2012-10-02 MED ORDER — ONDANSETRON HCL 4 MG/2ML IJ SOLN
4.0000 mg | Freq: Once | INTRAMUSCULAR | Status: AC
  Administered 2012-10-02: 4 mg via INTRAVENOUS
  Filled 2012-10-02: qty 2

## 2012-10-02 NOTE — ED Notes (Signed)
Pt c/o right upper quad abd pain with n/v/d that started Friday,

## 2012-10-02 NOTE — ED Provider Notes (Signed)
CSN: [id number]     Arrival date & time 10/02/12  1433 History   First MD Initiated Contact with Patient 10/02/12 1558     Chief Complaint  Patient presents with  . Abdominal Pain   (Consider location/radiation/quality/duration/timing/severity/associated sxs/prior Treatment) Patient is a 42 y.o. female presenting with abdominal pain. The history is provided by the patient (pt complains of abdominal pain).  Abdominal Pain Pain location:  Epigastric Pain quality: aching   Pain radiates to:  Does not radiate Pain severity:  Moderate Onset quality:  Gradual Timing:  Constant Progression:  Unchanged Associated symptoms: no chest pain, no cough, no diarrhea, no fatigue and no hematuria     Past Medical History  Diagnosis Date  . Scoliosis   . Hypertension   . Panic attack   . Back pain   . Depression   . Anxiety   . Ulcer    Past Surgical History  Procedure Laterality Date  . Back surgery    . Cesarean section    . Neck surgery    . Tubal ligation    . Cholecystectomy  11/27/2011    Procedure: LAPAROSCOPIC CHOLECYSTECTOMY;  Surgeon: Fabio Bering, MD;  Location: [facility];  Service: General;  Laterality: N/A;   Family History  Problem Relation Age of Onset  . Heart disease    . Arthritis    . Lung disease    . Cancer    . Asthma    . Diabetes     History  Substance Use Topics  . Smoking status: Current Every Day Smoker -- 2.00 packs/day    Types: Cigarettes  . Smokeless tobacco: Not on file  . Alcohol Use: No   OB History   Grav Para Term Preterm Abortions TAB SAB Ect Mult Living                 Review of Systems  Constitutional: Negative for appetite change and fatigue.  HENT: Negative for congestion, sinus pressure and ear discharge.   Eyes: Negative for discharge.  Respiratory: Negative for cough.   Cardiovascular: Negative for chest pain.  Gastrointestinal: Positive for abdominal pain. Negative for diarrhea.  Genitourinary: Negative for frequency and  hematuria.  Musculoskeletal: Negative for back pain.  Skin: Negative for rash.  Neurological: Negative for seizures and headaches.  Psychiatric/Behavioral: Negative for hallucinations.    Allergies  Penicillins; Ibuprofen; and Aspirin  Home Medications   Current Outpatient Rx  Name  Route  Sig  Dispense  Refill  . albuterol (PROVENTIL HFA;VENTOLIN HFA) 108 (90 BASE) MCG/ACT inhaler   Inhalation   Inhale 2 puffs into the lungs every 6 (six) hours as needed. For shortness of breath         . ALPRAZolam (XANAX) 1 MG tablet   Oral   Take 2 mg by mouth 3 (three) times daily.          . citalopram (CELEXA) 20 MG tablet   Oral   Take 20 mg by mouth daily.          Marland Kitchen gabapentin (NEURONTIN) 300 MG capsule   Oral   Take 300 mg by mouth 2 (two) times daily.          Marland Kitchen HYDROcodone-acetaminophen (NORCO) 10-325 MG per tablet   Oral   Take 1 tablet by mouth every 6 (six) hours as needed for pain.         Marland Kitchen lisinopril-hydrochlorothiazide (PRINZIDE,ZESTORETIC) 20-25 MG per tablet   Oral   Take 0.5 tablets  by mouth daily.          . meloxicam (MOBIC) 15 MG tablet   Oral   Take 15 mg by mouth daily.          . methocarbamol (ROBAXIN) 750 MG tablet   Oral   Take 750 mg by mouth 4 (four) times daily.           . pantoprazole (PROTONIX) 40 MG tablet   Oral   Take 40 mg by mouth 2 (two) times daily.          . temazepam (RESTORIL) 30 MG capsule   Oral   Take 60 mg by mouth at bedtime. For sleep         . oxyCODONE-acetaminophen (PERCOCET/ROXICET) 5-325 MG per tablet   Oral   Take 1 tablet by mouth every 6 (six) hours as needed for pain.   25 tablet   0   . promethazine (PHENERGAN) 25 MG tablet   Oral   Take 1 tablet (25 mg total) by mouth every 6 (six) hours as needed for nausea.   15 tablet   0   . tamsulosin (FLOMAX) 0.4 MG CAPS capsule   Oral   Take 1 capsule (0.4 mg total) by mouth daily.   5 capsule   0    BP 117/72  Pulse 89  Temp(Src)  98.5 F (36.9 C) (Oral)  Resp 18  Ht 5\' 2"  (1.575 m)  Wt 201 lb 6 oz (91.343 kg)  BMI 36.82 kg/m2  SpO2 98%  LMP 10/02/2012 Physical Exam  Constitutional: She is oriented to person, place, and time. She appears well-developed.  HENT:  Head: Normocephalic.  Eyes: Conjunctivae and EOM are normal. No scleral icterus.  Neck: Neck supple. No thyromegaly present.  Cardiovascular: Normal rate and regular rhythm.  Exam reveals no gallop and no friction rub.   No murmur heard. Pulmonary/Chest: No stridor. She has no wheezes. She has no rales. She exhibits no tenderness.  Abdominal: She exhibits no distension. There is tenderness. There is no rebound.  Musculoskeletal: Normal range of motion. She exhibits no edema.  Lymphadenopathy:    She has no cervical adenopathy.  Neurological: She is oriented to person, place, and time. Coordination normal.  Skin: No rash noted. No erythema.  Psychiatric: She has a normal mood and affect. Her behavior is normal.    ED Course  Procedures (including critical care time) Labs Review Labs Reviewed  CBC WITH DIFFERENTIAL - Abnormal; Notable for the following:    WBC 13.3 (*)    Hemoglobin 16.3 (*)    Neutro Abs 9.4 (*)    Monocytes Absolute 1.1 (*)    All other components within normal limits  COMPREHENSIVE METABOLIC PANEL - Abnormal; Notable for the following:    Potassium 3.3 (*)    Glucose, Bld 109 (*)    Creatinine, Ser 1.21 (*)    Alkaline Phosphatase 148 (*)    GFR calc non Af Amer 55 (*)    GFR calc Af Amer 63 (*)    All other components within normal limits  LIPASE, BLOOD   Imaging Review No results found.  MDM    Pt with epigastric pain.  Gastritis will follow up with her md and add carafate   [name], MD 10/02/12 1910

## 2012-10-02 NOTE — ED Notes (Signed)
Pt c/o intermittent RUQ pain as well as intermittent NVD x1-2 months. Pt denies blood in stool or vomit. Pt states "I feel like I have a big knot in my stomach".

## 2012-10-12 ENCOUNTER — Encounter: Payer: Self-pay | Admitting: Gastroenterology

## 2012-11-02 ENCOUNTER — Ambulatory Visit: Payer: Medicaid Other | Admitting: Gastroenterology

## 2012-11-02 ENCOUNTER — Telehealth: Payer: Self-pay | Admitting: Gastroenterology

## 2012-11-02 NOTE — Telephone Encounter (Signed)
Pt was a no show

## 2012-11-03 NOTE — Telephone Encounter (Signed)
Please send letter for f/u.  

## 2012-11-07 ENCOUNTER — Encounter: Payer: Self-pay | Admitting: Gastroenterology

## 2012-11-07 NOTE — Telephone Encounter (Signed)
Mailed letter °

## 2012-11-08 ENCOUNTER — Emergency Department (HOSPITAL_COMMUNITY): Payer: Medicaid Other

## 2012-11-08 ENCOUNTER — Encounter (HOSPITAL_COMMUNITY): Payer: Self-pay | Admitting: Emergency Medicine

## 2012-11-08 ENCOUNTER — Emergency Department (HOSPITAL_COMMUNITY)
Admission: EM | Admit: 2012-11-08 | Discharge: 2012-11-08 | Disposition: A | Discharge status: Home / Self Care | Payer: Medicaid Other | Attending: Emergency Medicine | Admitting: Emergency Medicine

## 2012-11-08 DIAGNOSIS — Z3202 Encounter for pregnancy test, result negative: Secondary | ICD-10-CM | POA: Insufficient documentation

## 2012-11-08 DIAGNOSIS — R1110 Vomiting, unspecified: Secondary | ICD-10-CM | POA: Insufficient documentation

## 2012-11-08 DIAGNOSIS — Z880 Allergy status to penicillin: Secondary | ICD-10-CM | POA: Insufficient documentation

## 2012-11-08 DIAGNOSIS — F17200 Nicotine dependence, unspecified, uncomplicated: Secondary | ICD-10-CM | POA: Insufficient documentation

## 2012-11-08 DIAGNOSIS — F329 Major depressive disorder, single episode, unspecified: Secondary | ICD-10-CM | POA: Insufficient documentation

## 2012-11-08 DIAGNOSIS — R1012 Left upper quadrant pain: Secondary | ICD-10-CM | POA: Insufficient documentation

## 2012-11-08 DIAGNOSIS — I10 Essential (primary) hypertension: Secondary | ICD-10-CM | POA: Insufficient documentation

## 2012-11-08 DIAGNOSIS — Z872 Personal history of diseases of the skin and subcutaneous tissue: Secondary | ICD-10-CM | POA: Insufficient documentation

## 2012-11-08 DIAGNOSIS — F410 Panic disorder [episodic paroxysmal anxiety] without agoraphobia: Secondary | ICD-10-CM | POA: Insufficient documentation

## 2012-11-08 DIAGNOSIS — Z8739 Personal history of other diseases of the musculoskeletal system and connective tissue: Secondary | ICD-10-CM | POA: Insufficient documentation

## 2012-11-08 DIAGNOSIS — R109 Unspecified abdominal pain: Secondary | ICD-10-CM

## 2012-11-08 DIAGNOSIS — Z79899 Other long term (current) drug therapy: Secondary | ICD-10-CM | POA: Insufficient documentation

## 2012-11-08 DIAGNOSIS — Z791 Long term (current) use of non-steroidal anti-inflammatories (NSAID): Secondary | ICD-10-CM | POA: Insufficient documentation

## 2012-11-08 DIAGNOSIS — F3289 Other specified depressive episodes: Secondary | ICD-10-CM | POA: Insufficient documentation

## 2012-11-08 LAB — HEPATIC FUNCTION PANEL
ALT: 14 U/L (ref 0–35)
Albumin: 3.7 g/dL (ref 3.5–5.2)
Alkaline Phosphatase: 169 U/L — ABNORMAL HIGH (ref 39–117)
Indirect Bilirubin: 0.5 mg/dL (ref 0.3–0.9)
Total Protein: 7.7 g/dL (ref 6.0–8.3)

## 2012-11-08 LAB — CBC WITH DIFFERENTIAL/PLATELET
Basophils Absolute: 0 10*3/uL (ref 0.0–0.1)
HCT: 45.6 % (ref 36.0–46.0)
Lymphocytes Relative: 16 % (ref 12–46)
Monocytes Absolute: 0.9 10*3/uL (ref 0.1–1.0)
Neutro Abs: 12.4 10*3/uL — ABNORMAL HIGH (ref 1.7–7.7)
Platelets: 260 10*3/uL (ref 150–400)
RBC: 4.68 MIL/uL (ref 3.87–5.11)
RDW: 13.5 % (ref 11.5–15.5)
WBC: 16 10*3/uL — ABNORMAL HIGH (ref 4.0–10.5)

## 2012-11-08 LAB — LIPASE, BLOOD: Lipase: 42 U/L (ref 11–59)

## 2012-11-08 LAB — BASIC METABOLIC PANEL
CO2: 24 mEq/L (ref 19–32)
Chloride: 102 mEq/L (ref 96–112)
Sodium: 139 mEq/L (ref 135–145)

## 2012-11-08 LAB — URINALYSIS, ROUTINE W REFLEX MICROSCOPIC
Glucose, UA: NEGATIVE mg/dL
Leukocytes, UA: NEGATIVE
Protein, ur: NEGATIVE mg/dL
Specific Gravity, Urine: 1.02 (ref 1.005–1.030)

## 2012-11-08 MED ORDER — ONDANSETRON HCL 4 MG/2ML IJ SOLN
4.0000 mg | Freq: Once | INTRAMUSCULAR | Status: AC
  Administered 2012-11-08: 4 mg via INTRAVENOUS
  Filled 2012-11-08: qty 2

## 2012-11-08 MED ORDER — SODIUM CHLORIDE 0.9 % IV BOLUS (SEPSIS)
1000.0000 mL | Freq: Once | INTRAVENOUS | Status: AC
  Administered 2012-11-08: 1000 mL via INTRAVENOUS

## 2012-11-08 MED ORDER — PROMETHAZINE HCL 25 MG PO TABS: 25.0000 mg | ORAL_TABLET | Freq: Four times a day (QID) | ORAL | Status: DC | PRN

## 2012-11-08 MED ORDER — HYDROCODONE-ACETAMINOPHEN 5-325 MG PO TABS: 2.0000 | ORAL_TABLET | ORAL | Status: DC | PRN

## 2012-11-08 MED ORDER — HYDROMORPHONE HCL PF 1 MG/ML IJ SOLN
1.0000 mg | Freq: Once | INTRAMUSCULAR | Status: AC
  Administered 2012-11-08: 1 mg via INTRAVENOUS
  Filled 2012-11-08: qty 1

## 2012-11-08 NOTE — ED Provider Notes (Addendum)
CSN: [id number]     Arrival date & time 11/08/12  1820 History   First MD Initiated Contact with Patient 11/08/12 1944     Chief Complaint  Patient presents with  . Abdominal Pain   (Consider location/radiation/quality/duration/timing/severity/associated sxs/prior Treatment) HPI.... sharp left upper quadrant pain for 2 days with associated vomiting.  This is atypical for patient. She had her gallbladder removed approximately one year ago. Nothing makes symptoms better or worse. Severity is mild to moderate. No fever, chills, dysuria, diarrhea  Past Medical History  Diagnosis Date  . Scoliosis   . Hypertension   . Panic attack   . Back pain   . Depression   . Anxiety   . Ulcer    Past Surgical History  Procedure Laterality Date  . Back surgery    . Cesarean section    . Neck surgery    . Tubal ligation    . Cholecystectomy  11/27/2011    Procedure: LAPAROSCOPIC CHOLECYSTECTOMY;  Surgeon: Fabio Bering, MD;  Location: [facility];  Service: General;  Laterality: N/A;   Family History  Problem Relation Age of Onset  . Heart disease    . Arthritis    . Lung disease    . Cancer    . Asthma    . Diabetes     History  Substance Use Topics  . Smoking status: Current Every Day Smoker -- 2.00 packs/day    Types: Cigarettes  . Smokeless tobacco: Not on file  . Alcohol Use: No   OB History   Grav Para Term Preterm Abortions TAB SAB Ect Mult Living                 Review of Systems  All other systems reviewed and are negative.    Allergies  Penicillins; Ibuprofen; and Aspirin  Home Medications   Current Outpatient Rx  Name  Route  Sig  Dispense  Refill  . albuterol (PROVENTIL HFA;VENTOLIN HFA) 108 (90 BASE) MCG/ACT inhaler   Inhalation   Inhale 2 puffs into the lungs every 6 (six) hours as needed. For shortness of breath         . ALPRAZolam (XANAX) 1 MG tablet   Oral   Take 2 mg by mouth 3 (three) times daily.          . citalopram (CELEXA) 20 MG tablet  Oral   Take 20 mg by mouth daily.          Marland Kitchen gabapentin (NEURONTIN) 300 MG capsule   Oral   Take 300 mg by mouth 3 (three) times daily.          Marland Kitchen HYDROcodone-acetaminophen (NORCO) 10-325 MG per tablet   Oral   Take 1 tablet by mouth 3 (three) times daily.          Marland Kitchen lisinopril-hydrochlorothiazide (PRINZIDE,ZESTORETIC) 20-25 MG per tablet   Oral   Take 1 tablet by mouth daily.          . meloxicam (MOBIC) 15 MG tablet   Oral   Take 15 mg by mouth daily.          . methocarbamol (ROBAXIN) 750 MG tablet   Oral   Take 750 mg by mouth 4 (four) times daily.           . pantoprazole (PROTONIX) 40 MG tablet   Oral   Take 40 mg by mouth 2 (two) times daily.          . temazepam (RESTORIL) 30  MG capsule   Oral   Take 60 mg by mouth at bedtime. For sleep          BP 127/80  Pulse 82  Temp(Src) 98.3 F (36.8 C) (Oral)  Resp 20  Ht 5\' 2"  (1.575 m)  Wt 200 lb (90.719 kg)  BMI 36.57 kg/m2  SpO2 98%  LMP 09/29/2012 Physical Exam  Nursing note and vitals reviewed. Constitutional: She is oriented to person, place, and time. She appears well-developed and well-nourished.  HENT:  Head: Normocephalic and atraumatic.  Eyes: Conjunctivae and EOM are normal. Pupils are equal, round, and reactive to light.  Neck: Normal range of motion. Neck supple.  Cardiovascular: Normal rate, regular rhythm and normal heart sounds.   Pulmonary/Chest: Effort normal and breath sounds normal.  Abdominal: Soft. Bowel sounds are normal.  Tender left upper quadrant  Musculoskeletal: Normal range of motion.  Neurological: She is alert and oriented to person, place, and time.  Skin: Skin is warm and dry.  Psychiatric: She has a normal mood and affect.    ED Course  Procedures (including critical care time) Labs Review Labs Reviewed  CBC WITH DIFFERENTIAL - Abnormal; Notable for the following:    WBC 16.0 (*)    Hemoglobin 16.1 (*)    MCH 34.4 (*)    Neutro Abs 12.4 (*)    All  other components within normal limits  BASIC METABOLIC PANEL - Abnormal; Notable for the following:    Glucose, Bld 107 (*)    Creatinine, Ser 1.36 (*)    GFR calc non Af Amer 48 (*)    GFR calc Af Amer 55 (*)    All other components within normal limits  HEPATIC FUNCTION PANEL - Abnormal; Notable for the following:    Alkaline Phosphatase 169 (*)    All other components within normal limits  URINALYSIS, ROUTINE W REFLEX MICROSCOPIC  LIPASE, BLOOD  PREGNANCY, URINE   Imaging Review No results found.  EKG Interpretation   None       MDM  No diagnosis found. White count is 16 K.   no acute abdomen.   Discharge home on hydrocodone and Phenergan 25 mg. Patient understands to return if worse.  CT scan 1 month ago was normal.   We discussed the concept of excessive radiation.  Patient will return if worse.    Donnetta Hutching, MD 11/08/12 2147  Donnetta Hutching, MD 11/11/12 [phone]

## 2012-11-08 NOTE — ED Notes (Signed)
Pt c/o LUQ started Sunday night. Pt states she might have pulled a muscle due to helping sister with care. Pain worse with movement. N/v x 4 in last 24hrs. Mm wet. Nad. Denies urinary sx's.

## 2012-11-29 ENCOUNTER — Encounter (HOSPITAL_COMMUNITY): Payer: Self-pay | Admitting: Emergency Medicine

## 2012-11-29 ENCOUNTER — Emergency Department (HOSPITAL_COMMUNITY): Payer: Medicaid Other

## 2012-11-29 ENCOUNTER — Emergency Department (HOSPITAL_COMMUNITY)
Admission: EM | Admit: 2012-11-29 | Discharge: 2012-11-29 | Disposition: A | Discharge status: Home / Self Care | Payer: Medicaid Other | Attending: Emergency Medicine | Admitting: Emergency Medicine

## 2012-11-29 DIAGNOSIS — IMO0001 Reserved for inherently not codable concepts without codable children: Secondary | ICD-10-CM | POA: Insufficient documentation

## 2012-11-29 DIAGNOSIS — F410 Panic disorder [episodic paroxysmal anxiety] without agoraphobia: Secondary | ICD-10-CM | POA: Insufficient documentation

## 2012-11-29 DIAGNOSIS — R112 Nausea with vomiting, unspecified: Secondary | ICD-10-CM | POA: Insufficient documentation

## 2012-11-29 DIAGNOSIS — Z872 Personal history of diseases of the skin and subcutaneous tissue: Secondary | ICD-10-CM | POA: Insufficient documentation

## 2012-11-29 DIAGNOSIS — Z79899 Other long term (current) drug therapy: Secondary | ICD-10-CM | POA: Insufficient documentation

## 2012-11-29 DIAGNOSIS — J209 Acute bronchitis, unspecified: Secondary | ICD-10-CM | POA: Insufficient documentation

## 2012-11-29 DIAGNOSIS — I10 Essential (primary) hypertension: Secondary | ICD-10-CM | POA: Insufficient documentation

## 2012-11-29 DIAGNOSIS — F3289 Other specified depressive episodes: Secondary | ICD-10-CM | POA: Insufficient documentation

## 2012-11-29 DIAGNOSIS — Z880 Allergy status to penicillin: Secondary | ICD-10-CM | POA: Insufficient documentation

## 2012-11-29 DIAGNOSIS — F329 Major depressive disorder, single episode, unspecified: Secondary | ICD-10-CM | POA: Insufficient documentation

## 2012-11-29 DIAGNOSIS — R509 Fever, unspecified: Secondary | ICD-10-CM | POA: Insufficient documentation

## 2012-11-29 DIAGNOSIS — R197 Diarrhea, unspecified: Secondary | ICD-10-CM | POA: Insufficient documentation

## 2012-11-29 DIAGNOSIS — Z791 Long term (current) use of non-steroidal anti-inflammatories (NSAID): Secondary | ICD-10-CM | POA: Insufficient documentation

## 2012-11-29 DIAGNOSIS — F17200 Nicotine dependence, unspecified, uncomplicated: Secondary | ICD-10-CM | POA: Insufficient documentation

## 2012-11-29 DIAGNOSIS — Z8739 Personal history of other diseases of the musculoskeletal system and connective tissue: Secondary | ICD-10-CM | POA: Insufficient documentation

## 2012-11-29 MED ORDER — IPRATROPIUM BROMIDE 0.02 % IN SOLN
0.5000 mg | Freq: Once | RESPIRATORY_TRACT | Status: AC
  Administered 2012-11-29: 0.5 mg via RESPIRATORY_TRACT
  Filled 2012-11-29: qty 2.5

## 2012-11-29 MED ORDER — ALBUTEROL SULFATE (5 MG/ML) 0.5% IN NEBU
2.5000 mg | INHALATION_SOLUTION | Freq: Once | RESPIRATORY_TRACT | Status: AC
  Administered 2012-11-29: 2.5 mg via RESPIRATORY_TRACT
  Filled 2012-11-29: qty 0.5

## 2012-11-29 MED ORDER — BENZONATATE 100 MG PO CAPS: 100.0000 mg | ORAL_CAPSULE | Freq: Three times a day (TID) | ORAL | Status: DC

## 2012-11-29 MED ORDER — PREDNISONE 50 MG PO TABS
60.0000 mg | ORAL_TABLET | Freq: Once | ORAL | Status: AC
  Administered 2012-11-29: 05:00:00 60 mg via ORAL
  Filled 2012-11-29 (×2): qty 1

## 2012-11-29 MED ORDER — ONDANSETRON 8 MG PO TBDP
8.0000 mg | ORAL_TABLET | Freq: Once | ORAL | Status: AC
  Administered 2012-11-29: 8 mg via ORAL
  Filled 2012-11-29: qty 1

## 2012-11-29 MED ORDER — PREDNISONE 50 MG PO TABS: 50.0000 mg | ORAL_TABLET | Freq: Every day | ORAL | Status: DC

## 2012-11-29 MED ORDER — TRAMADOL HCL 50 MG PO TABS
50.0000 mg | ORAL_TABLET | Freq: Once | ORAL | Status: AC
  Administered 2012-11-29: 50 mg via ORAL
  Filled 2012-11-29: qty 1

## 2012-11-29 NOTE — ED Notes (Signed)
Pt reports "I've been sick for about 2 weeks."  Reports cough, congestion, fever and body aches.  Also reports some nausea and vomiting.

## 2012-11-29 NOTE — ED Provider Notes (Signed)
CSN: [id number]     Arrival date & time 11/29/12  0359 History   First MD Initiated Contact with Patient 11/29/12 [phone]     Chief Complaint  Patient presents with  . URI   (Consider location/radiation/quality/duration/timing/severity/associated sxs/prior Treatment) Patient is a 42 y.o. female presenting with URI. The history is provided by the patient.  URI She states that she's been sick for 2 weeks with a cough, fever, chills, sweats, bodyaches. Cough was initially productive of your sputum but over the last one to 2 days has become nonproductive. There's been associated nausea, vomiting, diarrhea. She did see her PCP on November 5 and was started on levofloxacin. She states that she got worse when she started levofloxacin. She has used her albuterol inhaler which has given her slight relief and she is also used a home nebulizer which did not seem to give her any relief. She states that she is still smoking although she is not smoking as much since she started getting sick. She states she did not get a flu shot this year. She does not have a thermometer at home to check her temperature.  Past Medical History  Diagnosis Date  . Scoliosis   . Hypertension   . Panic attack   . Back pain   . Depression   . Anxiety   . Ulcer    Past Surgical History  Procedure Laterality Date  . Back surgery    . Cesarean section    . Neck surgery    . Tubal ligation    . Cholecystectomy  11/27/2011    Procedure: LAPAROSCOPIC CHOLECYSTECTOMY;  Surgeon: Fabio Bering, MD;  Location: [facility];  Service: General;  Laterality: N/A;   Family History  Problem Relation Age of Onset  . Heart disease    . Arthritis    . Lung disease    . Cancer    . Asthma    . Diabetes     History  Substance Use Topics  . Smoking status: Current Every Day Smoker -- 2.00 packs/day    Types: Cigarettes  . Smokeless tobacco: Not on file  . Alcohol Use: No   OB History   Grav Para Term Preterm Abortions TAB SAB Ect  Mult Living                 Review of Systems  All other systems reviewed and are negative.    Allergies  Penicillins; Ibuprofen; and Aspirin  Home Medications   Current Outpatient Rx  Name  Route  Sig  Dispense  Refill  . potassium chloride (K-DUR) 10 MEQ tablet   Oral   Take 10 mEq by mouth daily.         Marland Kitchen albuterol (PROVENTIL HFA;VENTOLIN HFA) 108 (90 BASE) MCG/ACT inhaler   Inhalation   Inhale 2 puffs into the lungs every 6 (six) hours as needed. For shortness of breath         . ALPRAZolam (XANAX) 1 MG tablet   Oral   Take 2 mg by mouth 3 (three) times daily.          . citalopram (CELEXA) 20 MG tablet   Oral   Take 20 mg by mouth daily.          Marland Kitchen gabapentin (NEURONTIN) 300 MG capsule   Oral   Take 300 mg by mouth 3 (three) times daily.          Marland Kitchen HYDROcodone-acetaminophen (NORCO) 10-325 MG per tablet   Oral  Take 1 tablet by mouth 3 (three) times daily.          Marland Kitchen HYDROcodone-acetaminophen (NORCO) 5-325 MG per tablet   Oral   Take 2 tablets by mouth every 4 (four) hours as needed for pain.   20 tablet   0   . lisinopril-hydrochlorothiazide (PRINZIDE,ZESTORETIC) 20-25 MG per tablet   Oral   Take 1 tablet by mouth daily.          . meloxicam (MOBIC) 15 MG tablet   Oral   Take 15 mg by mouth daily.          . methocarbamol (ROBAXIN) 750 MG tablet   Oral   Take 750 mg by mouth 4 (four) times daily.           . pantoprazole (PROTONIX) 40 MG tablet   Oral   Take 40 mg by mouth 2 (two) times daily.          . promethazine (PHENERGAN) 25 MG tablet   Oral   Take 1 tablet (25 mg total) by mouth every 6 (six) hours as needed for nausea.   20 tablet   0   . temazepam (RESTORIL) 30 MG capsule   Oral   Take 60 mg by mouth at bedtime. For sleep          BP 131/83  Pulse 84  Temp(Src) 98.2 F (36.8 C) (Oral)  Resp 18  Ht 5\' 2"  (1.575 m)  Wt 200 lb (90.719 kg)  BMI 36.57 kg/m2  SpO2 95%  LMP 09/29/2012 Physical Exam   Nursing note and vitals reviewed.  42 year old female, resting comfortably and in no acute distress. Vital signs are normal. Oxygen saturation is 95%, which is normal. Head is normocephalic and atraumatic. PERRLA, EOMI. Oropharynx is clear. Neck is nontender and supple without adenopathy or JVD. Back is nontender and there is no CVA tenderness. Lungs have a slightly prolonged exhalation phase. Mild wheezing is noted with cough and with forced exhalation. No rales are present. Chest is nontender. Heart has regular rate and rhythm without murmur. Abdomen is soft, flat, nontender without masses or hepatosplenomegaly and peristalsis is normoactive. Extremities have no cyanosis or edema, full range of motion is present. Skin is warm and dry without rash. Neurologic: Mental status is normal, cranial nerves are intact, there are no motor or sensory deficits.  ED Course  Procedures (including critical care time) Imaging Review Dg Chest 2 View  11/29/2012   CLINICAL DATA:  Upper respiratory infection  EXAM: CHEST  2 VIEW  COMPARISON:  11/08/2012  FINDINGS: The heart size and mediastinal contours are within normal limits. Both lungs are clear. The visualized skeletal structures are unremarkable.  IMPRESSION: No active cardiopulmonary disease.   Electronically Signed   By: Tiburcio Pea M.D.   On: 11/29/2012 06:15   Images viewed by me.  MDM   1. Acute bronchitis    Respiratory tract infection with bronchitis. Nausea and vomiting. Old records are reviewed and she has one prior ED visit for bronchitis. She's given dose of prednisone and chest x-ray will be checked. She'll be given nebulizer treatment with albuterol and ipratropium.  Chest x-ray shows no evidence of pneumonia. Phone but no other treatment, patient is noted to be sleeping breast relief. She is discharged with prescriptions for prednisone and benzonatate. She is advised to continue using her inhaler as needed.  Dione Booze,  MD 11/29/12 [phone]

## 2012-12-20 ENCOUNTER — Emergency Department (HOSPITAL_COMMUNITY): Payer: Medicaid Other

## 2012-12-20 ENCOUNTER — Encounter (HOSPITAL_COMMUNITY): Payer: Self-pay | Admitting: Emergency Medicine

## 2012-12-20 ENCOUNTER — Emergency Department (HOSPITAL_COMMUNITY)
Admission: EM | Admit: 2012-12-20 | Discharge: 2012-12-20 | Disposition: A | Discharge status: Home / Self Care | Payer: Medicaid Other | Attending: Emergency Medicine | Admitting: Emergency Medicine

## 2012-12-20 DIAGNOSIS — F410 Panic disorder [episodic paroxysmal anxiety] without agoraphobia: Secondary | ICD-10-CM | POA: Insufficient documentation

## 2012-12-20 DIAGNOSIS — F17200 Nicotine dependence, unspecified, uncomplicated: Secondary | ICD-10-CM | POA: Insufficient documentation

## 2012-12-20 DIAGNOSIS — H9209 Otalgia, unspecified ear: Secondary | ICD-10-CM | POA: Insufficient documentation

## 2012-12-20 DIAGNOSIS — F329 Major depressive disorder, single episode, unspecified: Secondary | ICD-10-CM | POA: Insufficient documentation

## 2012-12-20 DIAGNOSIS — J069 Acute upper respiratory infection, unspecified: Secondary | ICD-10-CM | POA: Insufficient documentation

## 2012-12-20 DIAGNOSIS — Z720 Tobacco use: Secondary | ICD-10-CM

## 2012-12-20 DIAGNOSIS — Z880 Allergy status to penicillin: Secondary | ICD-10-CM | POA: Insufficient documentation

## 2012-12-20 DIAGNOSIS — Z8739 Personal history of other diseases of the musculoskeletal system and connective tissue: Secondary | ICD-10-CM | POA: Insufficient documentation

## 2012-12-20 DIAGNOSIS — Z791 Long term (current) use of non-steroidal anti-inflammatories (NSAID): Secondary | ICD-10-CM | POA: Insufficient documentation

## 2012-12-20 DIAGNOSIS — I10 Essential (primary) hypertension: Secondary | ICD-10-CM | POA: Insufficient documentation

## 2012-12-20 DIAGNOSIS — Z872 Personal history of diseases of the skin and subcutaneous tissue: Secondary | ICD-10-CM | POA: Insufficient documentation

## 2012-12-20 DIAGNOSIS — Z79899 Other long term (current) drug therapy: Secondary | ICD-10-CM | POA: Insufficient documentation

## 2012-12-20 DIAGNOSIS — H9202 Otalgia, left ear: Secondary | ICD-10-CM

## 2012-12-20 DIAGNOSIS — F3289 Other specified depressive episodes: Secondary | ICD-10-CM | POA: Insufficient documentation

## 2012-12-20 LAB — RAPID STREP SCREEN (MED CTR MEBANE ONLY): Streptococcus, Group A Screen (Direct): NEGATIVE

## 2012-12-20 MED ORDER — BENZONATATE 100 MG PO CAPS: 200.0000 mg | ORAL_CAPSULE | Freq: Two times a day (BID) | ORAL | Status: DC | PRN

## 2012-12-20 MED ORDER — HYDROCODONE-ACETAMINOPHEN 7.5-325 MG/15ML PO SOLN: 15.0000 mL | Freq: Every evening | ORAL | Status: DC | PRN

## 2012-12-20 MED ORDER — ANTIPYRINE-BENZOCAINE 5.4-1.4 % OT SOLN: 3.0000 [drp] | OTIC | Status: DC | PRN

## 2012-12-20 NOTE — ED Notes (Signed)
Called patient to room, patient no longer in waiting area.

## 2012-12-20 NOTE — ED Notes (Signed)
Sick for 1 month, sore throat, hoarse. Cough, yellow brown sputum.  Has taken a course of levaquin.  Seen here 12/18  And given tessalon and prednisone.

## 2012-12-20 NOTE — ED Provider Notes (Signed)
CSN: [id number]     Arrival date & time 12/20/12  2141 History   First MD Initiated Contact with Patient 12/20/12 2154     Chief Complaint  Patient presents with  . Cough   (Consider location/radiation/quality/duration/timing/severity/associated sxs/prior Treatment) Patient is a 42 y.o. female presenting with cough. The history is provided by the patient. No language interpreter was used.  Cough Cough characteristics:  Productive Sputum characteristics:  Yellow and brown Severity:  Moderate Onset quality:  Gradual Duration:  4 weeks Timing:  Intermittent Progression:  Unchanged Chronicity:  New Smoker: yes   Context: sick contacts and smoke exposure   Relieved by: Tessalon and prednisone burst prescribed on 11/29/12. Worsened by:  Activity, deep breathing and smoking Ineffective treatments: OTC medications. Associated symptoms: ear fullness, ear pain (pressure), shortness of breath (secondary to prolonged coughing), sinus congestion and sore throat   Associated symptoms: no chest pain, no fever and no wheezing     Past Medical History  Diagnosis Date  . Scoliosis   . Hypertension   . Panic attack   . Back pain   . Depression   . Anxiety   . Ulcer    Past Surgical History  Procedure Laterality Date  . Back surgery    . Cesarean section    . Neck surgery    . Tubal ligation    . Cholecystectomy  11/27/2011    Procedure: LAPAROSCOPIC CHOLECYSTECTOMY;  Surgeon: Fabio Bering, MD;  Location: [facility];  Service: General;  Laterality: N/A;   Family History  Problem Relation Age of Onset  . Heart disease    . Arthritis    . Lung disease    . Cancer    . Asthma    . Diabetes     History  Substance Use Topics  . Smoking status: Current Every Day Smoker -- 2.00 packs/day    Types: Cigarettes  . Smokeless tobacco: Not on file  . Alcohol Use: No   OB History   Grav Para Term Preterm Abortions TAB SAB Ect Mult Living                 Review of Systems   Constitutional: Negative for fever.  HENT: Positive for congestion, ear pain (pressure), sinus pressure and sore throat. Negative for trouble swallowing.   Respiratory: Positive for cough (congested, productive) and shortness of breath (secondary to prolonged coughing). Negative for wheezing.   Cardiovascular: Negative for chest pain.  Gastrointestinal: Negative for nausea and vomiting.  Neurological: Negative for syncope and weakness.  All other systems reviewed and are negative.    Allergies  Penicillins; Ibuprofen; and Aspirin  Home Medications   Current Outpatient Rx  Name  Route  Sig  Dispense  Refill  . albuterol (PROVENTIL HFA;VENTOLIN HFA) 108 (90 BASE) MCG/ACT inhaler   Inhalation   Inhale 2 puffs into the lungs every 6 (six) hours as needed. For shortness of breath         . ALPRAZolam (XANAX) 1 MG tablet   Oral   Take 2 mg by mouth 3 (three) times daily.          . citalopram (CELEXA) 20 MG tablet   Oral   Take 20 mg by mouth daily.          Marland Kitchen gabapentin (NEURONTIN) 300 MG capsule   Oral   Take 300 mg by mouth 3 (three) times daily.          Marland Kitchen HYDROcodone-acetaminophen (NORCO) 7.5-325 MG per  tablet   Oral   Take 1 tablet by mouth 3 (three) times daily as needed for moderate pain.         Marland Kitchen lisinopril-hydrochlorothiazide (PRINZIDE,ZESTORETIC) 20-25 MG per tablet   Oral   Take 1 tablet by mouth daily.          . meloxicam (MOBIC) 15 MG tablet   Oral   Take 15 mg by mouth daily.          . methocarbamol (ROBAXIN) 750 MG tablet   Oral   Take 750 mg by mouth 4 (four) times daily.           . pantoprazole (PROTONIX) 40 MG tablet   Oral   Take 40 mg by mouth 2 (two) times daily.          . potassium chloride (K-DUR) 10 MEQ tablet   Oral   Take 10 mEq by mouth daily.         . temazepam (RESTORIL) 30 MG capsule   Oral   Take 60 mg by mouth at bedtime. For sleep         . antipyrine-benzocaine (AURALGAN) otic solution   Left  Ear   Place 3-4 drops into the left ear every 2 (two) hours as needed for ear pain.   10 mL   0   . benzonatate (TESSALON) 100 MG capsule   Oral   Take 2 capsules (200 mg total) by mouth 2 (two) times daily as needed for cough.   20 capsule   0   . HYDROcodone-acetaminophen (HYCET) 7.5-325 mg/15 ml solution   Oral   Take 15 mLs by mouth at bedtime as needed for moderate pain.   60 mL   0    BP 148/95  Pulse 75  Temp(Src) 98.4 F (36.9 C) (Oral)  Resp 20  Ht 5\' 2"  (1.575 m)  Wt 180 lb (81.647 kg)  BMI 32.91 kg/m2  SpO2 96%  LMP 11/02/2012  Physical Exam  Nursing note and vitals reviewed. Constitutional: She is oriented to person, place, and time. She appears well-developed and well-nourished. No distress.  HENT:  Head: Normocephalic and atraumatic.  Right Ear: Tympanic membrane, external ear and ear canal normal. Tympanic membrane is not erythematous.  Left Ear: Tympanic membrane and ear canal normal. There is tenderness. No foreign bodies. Tympanic membrane is not erythematous.  Nose: Nose normal.  Mouth/Throat: Uvula is midline and mucous membranes are normal. Posterior oropharyngeal edema and posterior oropharyngeal erythema (mild) present. No oropharyngeal exudate or tonsillar abscesses.  Airway patent. Patient tolerating secretions without difficulty.  Eyes: Conjunctivae and EOM are normal. Pupils are equal, round, and reactive to light. No scleral icterus.  Neck: Normal range of motion. Neck supple.  Cardiovascular: Normal rate, regular rhythm and normal heart sounds.   Pulmonary/Chest: Effort normal and breath sounds normal. No respiratory distress. She has no wheezes. She has no rales.  No retractions or accessory muscle use. Symmetric chest expansion.  Musculoskeletal: Normal range of motion.  Lymphadenopathy:    She has no cervical adenopathy.  Neurological: She is alert and oriented to person, place, and time.  Skin: Skin is warm and dry. No rash noted. She  is not diaphoretic. No erythema. No pallor.  Psychiatric: She has a normal mood and affect. Her behavior is normal.    ED Course  Procedures (including critical care time) Labs Review Labs Reviewed  RAPID STREP SCREEN  CULTURE, GROUP A STREP   Imaging Review Dg Chest 2  View  12/20/2012   CLINICAL DATA:  Cough and shortness of breath.  EXAM: CHEST  2 VIEW  COMPARISON:  Chest radiograph performed 11/29/2012  FINDINGS: The lungs are well-aerated. Pulmonary vascularity is at the upper limits of normal. There is no evidence of focal opacification, pleural effusion or pneumothorax.  The heart is normal in size; the mediastinal contour is within normal limits. No acute osseous abnormalities are seen. Cervical spinal fusion hardware is partially imaged.  IMPRESSION: No acute cardiopulmonary process seen.   Electronically Signed   By: Roanna Raider M.D.   On: 12/20/2012 22:46    EKG Interpretation   None       MDM   1. Viral URI with cough   2. Tobacco use   3. Otalgia of left ear    Uncomplicated, persistent URI with cough. Patient 1 ppd smoker; delayed placement into room because went outside to smoke after checking in with registration. Patient well and nontoxic appearing, hemodynamically stable and afebrile. Physical exam significant for TTP of tragus and when pulling on auricle. No mastoid erythema, swelling, or TTP b/l. While tenderness appreciated, symptoms most c/w discomfort from sinus pressure build up and congestion. Low suspicion for otitis externa. Will tx with Auralgan.  CXR negative for bronchitis and pneumonia today. Rapid strep negative. Patient stable and appropriate for d/c with PCP follow up. Will prescribe Tessalon for cough and Hycet QHS PRN. Return precautions advised and patient agreeable to plan with no unaddressed concerns.   Filed Vitals:   12/20/12 2149  BP: 148/95  Pulse: 75  Temp: 98.4 F (36.9 C)  TempSrc: Oral  Resp: 20  Height: 5\' 2"  (1.575 m)   Weight: 180 lb (81.647 kg)  SpO2: 96%     Antony Madura, PA-C 12/20/12 2348

## 2012-12-22 LAB — CULTURE, GROUP A STREP

## 2012-12-23 NOTE — ED Provider Notes (Signed)
Medical screening examination/treatment/procedure(s) were performed by non-physician practitioner and as supervising physician I was immediately available for consultation/collaboration.  EKG Interpretation   None          Novak Stgermaine J. Sharni Negron, MD 12/23/12 2129 

## 2013-01-30 ENCOUNTER — Encounter (HOSPITAL_COMMUNITY): Payer: Self-pay | Admitting: Emergency Medicine

## 2013-01-30 ENCOUNTER — Emergency Department (HOSPITAL_COMMUNITY): Payer: Medicaid Other

## 2013-01-30 ENCOUNTER — Emergency Department (HOSPITAL_COMMUNITY)
Admission: EM | Admit: 2013-01-30 | Discharge: 2013-01-30 | Disposition: A | Discharge status: Home / Self Care | Payer: Medicaid Other | Attending: Emergency Medicine | Admitting: Emergency Medicine

## 2013-01-30 DIAGNOSIS — Z8739 Personal history of other diseases of the musculoskeletal system and connective tissue: Secondary | ICD-10-CM | POA: Insufficient documentation

## 2013-01-30 DIAGNOSIS — F410 Panic disorder [episodic paroxysmal anxiety] without agoraphobia: Secondary | ICD-10-CM | POA: Insufficient documentation

## 2013-01-30 DIAGNOSIS — I10 Essential (primary) hypertension: Secondary | ICD-10-CM | POA: Insufficient documentation

## 2013-01-30 DIAGNOSIS — R1032 Left lower quadrant pain: Secondary | ICD-10-CM | POA: Insufficient documentation

## 2013-01-30 DIAGNOSIS — D72829 Elevated white blood cell count, unspecified: Secondary | ICD-10-CM | POA: Insufficient documentation

## 2013-01-30 DIAGNOSIS — G8929 Other chronic pain: Secondary | ICD-10-CM | POA: Insufficient documentation

## 2013-01-30 DIAGNOSIS — F17200 Nicotine dependence, unspecified, uncomplicated: Secondary | ICD-10-CM | POA: Insufficient documentation

## 2013-01-30 DIAGNOSIS — Z872 Personal history of diseases of the skin and subcutaneous tissue: Secondary | ICD-10-CM | POA: Insufficient documentation

## 2013-01-30 DIAGNOSIS — Z880 Allergy status to penicillin: Secondary | ICD-10-CM | POA: Insufficient documentation

## 2013-01-30 DIAGNOSIS — R109 Unspecified abdominal pain: Secondary | ICD-10-CM

## 2013-01-30 DIAGNOSIS — Z79899 Other long term (current) drug therapy: Secondary | ICD-10-CM | POA: Insufficient documentation

## 2013-01-30 DIAGNOSIS — Z3202 Encounter for pregnancy test, result negative: Secondary | ICD-10-CM | POA: Insufficient documentation

## 2013-01-30 DIAGNOSIS — F329 Major depressive disorder, single episode, unspecified: Secondary | ICD-10-CM | POA: Insufficient documentation

## 2013-01-30 DIAGNOSIS — F3289 Other specified depressive episodes: Secondary | ICD-10-CM | POA: Insufficient documentation

## 2013-01-30 DIAGNOSIS — Z791 Long term (current) use of non-steroidal anti-inflammatories (NSAID): Secondary | ICD-10-CM | POA: Insufficient documentation

## 2013-01-30 LAB — BASIC METABOLIC PANEL
BUN: 17 mg/dL (ref 6–23)
CO2: 25 mEq/L (ref 19–32)
Calcium: 8.9 mg/dL (ref 8.4–10.5)
Chloride: 103 mEq/L (ref 96–112)
Creatinine, Ser: 1.49 mg/dL — ABNORMAL HIGH (ref 0.50–1.10)
GFR calc Af Amer: 49 mL/min — ABNORMAL LOW (ref >90)
GFR calc non Af Amer: 42 mL/min — ABNORMAL LOW (ref >90)
Glucose, Bld: 131 mg/dL — ABNORMAL HIGH (ref 70–99)
Potassium: 3.7 mEq/L (ref 3.7–5.3)
Sodium: 141 mEq/L (ref 137–147)

## 2013-01-30 LAB — URINALYSIS, ROUTINE W REFLEX MICROSCOPIC
Glucose, UA: NEGATIVE mg/dL
Hgb urine dipstick: NEGATIVE
Ketones, ur: NEGATIVE mg/dL
Leukocytes, UA: NEGATIVE
Nitrite: NEGATIVE
Protein, ur: NEGATIVE mg/dL
Specific Gravity, Urine: >1.03 — ABNORMAL HIGH (ref 1.005–1.030)
Urobilinogen, UA: 0.2 mg/dL (ref 0.0–1.0)
pH: 5 (ref 5.0–8.0)

## 2013-01-30 LAB — CBC WITH DIFFERENTIAL/PLATELET
Basophils Absolute: 0 10*3/uL (ref 0.0–0.1)
Basophils Relative: 0 % (ref 0–1)
Eosinophils Absolute: 0.1 10*3/uL (ref 0.0–0.7)
Eosinophils Relative: 1 % (ref 0–5)
HCT: 42.6 % (ref 36.0–46.0)
Hemoglobin: 15.8 g/dL — ABNORMAL HIGH (ref 12.0–15.0)
Lymphocytes Relative: 21 % (ref 12–46)
Lymphs Abs: 2.6 10*3/uL (ref 0.7–4.0)
MCH: 35 pg — ABNORMAL HIGH (ref 26.0–34.0)
MCHC: 37.1 g/dL — ABNORMAL HIGH (ref 30.0–36.0)
MCV: 94.5 fL (ref 78.0–100.0)
Monocytes Absolute: 1.3 10*3/uL — ABNORMAL HIGH (ref 0.1–1.0)
Monocytes Relative: 11 % (ref 3–12)
Neutro Abs: 8.1 10*3/uL — ABNORMAL HIGH (ref 1.7–7.7)
Neutrophils Relative %: 67 % (ref 43–77)
Platelets: 210 10*3/uL (ref 150–400)
RBC: 4.51 MIL/uL (ref 3.87–5.11)
RDW: 13.7 % (ref 11.5–15.5)
WBC: 12.2 10*3/uL — ABNORMAL HIGH (ref 4.0–10.5)

## 2013-01-30 LAB — PREGNANCY, URINE: Preg Test, Ur: NEGATIVE

## 2013-01-30 MED ORDER — MORPHINE SULFATE 4 MG/ML IJ SOLN
8.0000 mg | Freq: Once | INTRAMUSCULAR | Status: AC
  Administered 2013-01-30: 8 mg via INTRAVENOUS
  Filled 2013-01-30: qty 2

## 2013-01-30 MED ORDER — ONDANSETRON HCL 4 MG/2ML IJ SOLN
4.0000 mg | Freq: Once | INTRAMUSCULAR | Status: DC
  Filled 2013-01-30: qty 2

## 2013-01-30 MED ORDER — ONDANSETRON HCL 4 MG/2ML IJ SOLN
4.0000 mg | Freq: Once | INTRAMUSCULAR | Status: AC
  Administered 2013-01-30: 4 mg via INTRAVENOUS

## 2013-01-30 MED ORDER — TRAMADOL HCL 50 MG PO TABS: 50.0000 mg | ORAL_TABLET | Freq: Four times a day (QID) | ORAL | Status: DC | PRN

## 2013-01-30 MED ORDER — LOPERAMIDE HCL 2 MG PO CAPS
4.0000 mg | ORAL_CAPSULE | Freq: Once | ORAL | Status: AC
  Administered 2013-01-30: 4 mg via ORAL
  Filled 2013-01-30: qty 2

## 2013-01-30 MED ORDER — IOHEXOL 300 MG/ML  SOLN
50.0000 mL | Freq: Once | INTRAMUSCULAR | Status: AC | PRN
  Administered 2013-01-30: 50 mL via ORAL

## 2013-01-30 MED ORDER — SODIUM CHLORIDE 0.9 % IV BOLUS (SEPSIS)
1000.0000 mL | Freq: Once | INTRAVENOUS | Status: AC
  Administered 2013-01-30: 1000 mL via INTRAVENOUS

## 2013-01-30 MED ORDER — IOHEXOL 300 MG/ML  SOLN
80.0000 mL | Freq: Once | INTRAMUSCULAR | Status: AC | PRN
  Administered 2013-01-30: 80 mL via INTRAVENOUS

## 2013-01-30 NOTE — ED Notes (Signed)
Pt now tells me that she's had diarrhea for the past 2 days.

## 2013-01-30 NOTE — Discharge Instructions (Signed)
Abdominal Pain, Women °Abdominal (stomach, pelvic, or belly) pain can be caused by many things. It is important to tell your doctor: °· The location of the pain. °· Does it come and go or is it present all the time? °· Are there things that start the pain (eating certain foods, exercise)? °· Are there other symptoms associated with the pain (fever, nausea, vomiting, diarrhea)? °All of this is helpful to know when trying to find the cause of the pain. °CAUSES  °· Stomach: virus or bacteria infection, or ulcer. °· Intestine: appendicitis (inflamed appendix), regional ileitis (Crohn's disease), ulcerative colitis (inflamed colon), irritable bowel syndrome, diverticulitis (inflamed diverticulum of the colon), or cancer of the stomach or intestine. °· Gallbladder disease or stones in the gallbladder. °· Kidney disease, kidney stones, or infection. °· Pancreas infection or cancer. °· Fibromyalgia (pain disorder). °· Diseases of the female organs: °· Uterus: fibroid (non-cancerous) tumors or infection. °· Fallopian tubes: infection or tubal pregnancy. °· Ovary: cysts or tumors. °· Pelvic adhesions (scar tissue). °· Endometriosis (uterus lining tissue growing in the pelvis and on the pelvic organs). °· Pelvic congestion syndrome (female organs filling up with blood just before the menstrual period). °· Pain with the menstrual period. °· Pain with ovulation (producing an egg). °· Pain with an IUD (intrauterine device, birth control) in the uterus. °· Cancer of the female organs. °· Functional pain (pain not caused by a disease, may improve without treatment). °· Psychological pain. °· Depression. °DIAGNOSIS  °Your doctor will decide the seriousness of your pain by doing an examination. °· Blood tests. °· X-rays. °· Ultrasound. °· CT scan (computed tomography, special type of X-ray). °· MRI (magnetic resonance imaging). °· Cultures, for infection. °· Barium enema (dye inserted in the large intestine, to better view it with  X-rays). °· Colonoscopy (looking in intestine with a lighted tube). °· Laparoscopy (minor surgery, looking in abdomen with a lighted tube). °· Major abdominal exploratory surgery (looking in abdomen with a large incision). °TREATMENT  °The treatment will depend on the cause of the pain.  °· Many cases can be observed and treated at home. °· Over-the-counter medicines recommended by your caregiver. °· Prescription medicine. °· Antibiotics, for infection. °· Birth control pills, for painful periods or for ovulation pain. °· Hormone treatment, for endometriosis. °· Nerve blocking injections. °· Physical therapy. °· Antidepressants. °· Counseling with a psychologist or psychiatrist. °· Minor or major surgery. °HOME CARE INSTRUCTIONS  °· Do not take laxatives, unless directed by your caregiver. °· Take over-the-counter pain medicine only if ordered by your caregiver. Do not take aspirin because it can cause an upset stomach or bleeding. °· Try a clear liquid diet (broth or water) as ordered by your caregiver. Slowly move to a bland diet, as tolerated, if the pain is related to the stomach or intestine. °· Have a thermometer and take your temperature several times a day, and record it. °· Bed rest and sleep, if it helps the pain. °· Avoid sexual intercourse, if it causes pain. °· Avoid stressful situations. °· Keep your follow-up appointments and tests, as your caregiver orders. °· If the pain does not go away with medicine or surgery, you may try: °· Acupuncture. °· Relaxation exercises (yoga, meditation). °· Group therapy. °· Counseling. °SEEK MEDICAL CARE IF:  °· You notice certain foods cause stomach pain. °· Your home care treatment is not helping your pain. °· You need stronger pain medicine. °· You want your IUD removed. °· You feel faint or   lightheaded. °· You develop nausea and vomiting. °· You develop a rash. °· You are having side effects or an allergy to your medicine. °SEEK IMMEDIATE MEDICAL CARE IF:  °· Your  pain does not go away or gets worse. °· You have a fever. °· Your pain is felt only in portions of the abdomen. The right side could possibly be appendicitis. The left lower portion of the abdomen could be colitis or diverticulitis. °· You are passing blood in your stools (bright red or black tarry stools, with or without vomiting). °· You have blood in your urine. °· You develop chills, with or without a fever. °· You pass out. °MAKE SURE YOU:  °· Understand these instructions. °· Will watch your condition. °· Will get help right away if you are not doing well or get worse. °Document Released: 10/26/2006 Document Revised: 03/23/2011 Document Reviewed: 11/15/2008 °[employer]® Patient Information ©2014 [employer]. ° °

## 2013-01-30 NOTE — ED Notes (Signed)
Patient c/o lower abdominal cramping since yesterday morning.  Patient states she has a history of ovarian cysts.

## 2013-02-04 ENCOUNTER — Emergency Department (HOSPITAL_COMMUNITY)
Admission: EM | Admit: 2013-02-04 | Discharge: 2013-02-04 | Disposition: A | Discharge status: Home / Self Care | Payer: Medicaid Other | Attending: Emergency Medicine | Admitting: Emergency Medicine

## 2013-02-04 ENCOUNTER — Encounter (HOSPITAL_COMMUNITY): Payer: Self-pay | Admitting: Emergency Medicine

## 2013-02-04 DIAGNOSIS — I10 Essential (primary) hypertension: Secondary | ICD-10-CM | POA: Insufficient documentation

## 2013-02-04 DIAGNOSIS — F17200 Nicotine dependence, unspecified, uncomplicated: Secondary | ICD-10-CM | POA: Insufficient documentation

## 2013-02-04 DIAGNOSIS — F329 Major depressive disorder, single episode, unspecified: Secondary | ICD-10-CM | POA: Insufficient documentation

## 2013-02-04 DIAGNOSIS — R059 Cough, unspecified: Secondary | ICD-10-CM | POA: Insufficient documentation

## 2013-02-04 DIAGNOSIS — K219 Gastro-esophageal reflux disease without esophagitis: Secondary | ICD-10-CM | POA: Insufficient documentation

## 2013-02-04 DIAGNOSIS — R197 Diarrhea, unspecified: Secondary | ICD-10-CM | POA: Insufficient documentation

## 2013-02-04 DIAGNOSIS — Z720 Tobacco use: Secondary | ICD-10-CM

## 2013-02-04 DIAGNOSIS — R05 Cough: Secondary | ICD-10-CM | POA: Insufficient documentation

## 2013-02-04 DIAGNOSIS — Z9089 Acquired absence of other organs: Secondary | ICD-10-CM | POA: Insufficient documentation

## 2013-02-04 DIAGNOSIS — Z79899 Other long term (current) drug therapy: Secondary | ICD-10-CM | POA: Insufficient documentation

## 2013-02-04 DIAGNOSIS — E663 Overweight: Secondary | ICD-10-CM | POA: Insufficient documentation

## 2013-02-04 DIAGNOSIS — Z791 Long term (current) use of non-steroidal anti-inflammatories (NSAID): Secondary | ICD-10-CM | POA: Insufficient documentation

## 2013-02-04 DIAGNOSIS — M549 Dorsalgia, unspecified: Secondary | ICD-10-CM | POA: Insufficient documentation

## 2013-02-04 DIAGNOSIS — F3289 Other specified depressive episodes: Secondary | ICD-10-CM | POA: Insufficient documentation

## 2013-02-04 DIAGNOSIS — R109 Unspecified abdominal pain: Secondary | ICD-10-CM

## 2013-02-04 DIAGNOSIS — F411 Generalized anxiety disorder: Secondary | ICD-10-CM | POA: Insufficient documentation

## 2013-02-04 DIAGNOSIS — Z9851 Tubal ligation status: Secondary | ICD-10-CM | POA: Insufficient documentation

## 2013-02-04 DIAGNOSIS — G8929 Other chronic pain: Secondary | ICD-10-CM

## 2013-02-04 DIAGNOSIS — R1013 Epigastric pain: Secondary | ICD-10-CM | POA: Insufficient documentation

## 2013-02-04 DIAGNOSIS — Z880 Allergy status to penicillin: Secondary | ICD-10-CM | POA: Insufficient documentation

## 2013-02-04 HISTORY — DX: Dorsalgia, unspecified: M54.9

## 2013-02-04 HISTORY — DX: Other chronic pain: G89.29

## 2013-02-04 LAB — URINALYSIS, ROUTINE W REFLEX MICROSCOPIC
BILIRUBIN URINE: NEGATIVE
Glucose, UA: NEGATIVE mg/dL
HGB URINE DIPSTICK: NEGATIVE
KETONES UR: NEGATIVE mg/dL
Leukocytes, UA: NEGATIVE
Nitrite: NEGATIVE
Protein, ur: NEGATIVE mg/dL
SPECIFIC GRAVITY, URINE: 1.01 (ref 1.005–1.030)
UROBILINOGEN UA: 0.2 mg/dL (ref 0.0–1.0)
pH: 7.5 (ref 5.0–8.0)

## 2013-02-04 LAB — LIPASE, BLOOD: Lipase: 67 U/L — ABNORMAL HIGH (ref 11–59)

## 2013-02-04 LAB — CBC WITH DIFFERENTIAL/PLATELET
BASOS PCT: 0 % (ref 0–1)
Basophils Absolute: 0 10*3/uL (ref 0.0–0.1)
EOS ABS: 0.2 10*3/uL (ref 0.0–0.7)
EOS PCT: 1 % (ref 0–5)
HCT: 48.6 % — ABNORMAL HIGH (ref 36.0–46.0)
Hemoglobin: 17.7 g/dL — ABNORMAL HIGH (ref 12.0–15.0)
Lymphocytes Relative: 16 % (ref 12–46)
Lymphs Abs: 3.2 10*3/uL (ref 0.7–4.0)
MCH: 34.4 pg — AB (ref 26.0–34.0)
MCHC: 36.4 g/dL — AB (ref 30.0–36.0)
MCV: 94.6 fL (ref 78.0–100.0)
MONOS PCT: 6 % (ref 3–12)
Monocytes Absolute: 1.2 10*3/uL — ABNORMAL HIGH (ref 0.1–1.0)
Neutro Abs: 15.4 10*3/uL — ABNORMAL HIGH (ref 1.7–7.7)
Neutrophils Relative %: 77 % (ref 43–77)
Platelets: 293 10*3/uL (ref 150–400)
RBC: 5.14 MIL/uL — AB (ref 3.87–5.11)
RDW: 13.9 % (ref 11.5–15.5)
WBC: 20 10*3/uL — ABNORMAL HIGH (ref 4.0–10.5)

## 2013-02-04 LAB — COMPREHENSIVE METABOLIC PANEL
ALT: 22 U/L (ref 0–35)
AST: 18 U/L (ref 0–37)
Albumin: 4.1 g/dL (ref 3.5–5.2)
Alkaline Phosphatase: 179 U/L — ABNORMAL HIGH (ref 39–117)
BUN: 11 mg/dL (ref 6–23)
CALCIUM: 9.5 mg/dL (ref 8.4–10.5)
CO2: 24 meq/L (ref 19–32)
CREATININE: 1.09 mg/dL (ref 0.50–1.10)
Chloride: 103 mEq/L (ref 96–112)
GFR calc Af Amer: 72 mL/min — ABNORMAL LOW (ref >90)
GFR, EST NON AFRICAN AMERICAN: 62 mL/min — AB (ref >90)
GLUCOSE: 113 mg/dL — AB (ref 70–99)
Potassium: 3.8 mEq/L (ref 3.7–5.3)
Sodium: 140 mEq/L (ref 137–147)
Total Bilirubin: 0.4 mg/dL (ref 0.3–1.2)
Total Protein: 8.3 g/dL (ref 6.0–8.3)

## 2013-02-04 MED ORDER — SUCRALFATE 1 GM/10ML PO SUSP: 1.0000 g | Freq: Three times a day (TID) | ORAL | Status: DC

## 2013-02-04 MED ORDER — SUCRALFATE 1 G PO TABS
1.0000 g | ORAL_TABLET | Freq: Three times a day (TID) | ORAL | Status: DC
  Filled 2013-02-04 (×7): qty 1

## 2013-02-04 MED ORDER — PANTOPRAZOLE SODIUM 40 MG IV SOLR
40.0000 mg | Freq: Once | INTRAVENOUS | Status: AC
  Administered 2013-02-04: 40 mg via INTRAVENOUS
  Filled 2013-02-04: qty 40

## 2013-02-04 MED ORDER — ONDANSETRON HCL 4 MG/2ML IJ SOLN
4.0000 mg | Freq: Once | INTRAMUSCULAR | Status: AC
  Administered 2013-02-04: 4 mg via INTRAVENOUS
  Filled 2013-02-04: qty 2

## 2013-02-04 MED ORDER — SODIUM CHLORIDE 0.9 % IV BOLUS (SEPSIS)
500.0000 mL | Freq: Once | INTRAVENOUS | Status: AC
  Administered 2013-02-04: 20:00:00 via INTRAVENOUS

## 2013-02-04 MED ORDER — SODIUM CHLORIDE 0.9 % IV SOLN: INTRAVENOUS | Status: DC

## 2013-02-04 MED ORDER — HYDROMORPHONE HCL PF 1 MG/ML IJ SOLN
1.0000 mg | Freq: Once | INTRAMUSCULAR | Status: AC
  Administered 2013-02-04: 1 mg via INTRAVENOUS
  Filled 2013-02-04: qty 1

## 2013-02-04 NOTE — ED Provider Notes (Signed)
CSN: [id number]     Arrival date & time 02/04/13  1758 History   First MD Initiated Contact with Patient 02/04/13 1917     Chief Complaint  Patient presents with  . Abdominal Pain   (Consider location/radiation/quality/duration/timing/severity/associated sxs/prior Treatment) Patient is a 43 y.o. female presenting with abdominal pain. The history is provided by the patient and the spouse.  Abdominal Pain  She is here for evaluation of upper abdominal pain that radiates to her mid back. The pain is ongoing since recent evaluation here and prescribed treatment with tramadol. She is using tramadol in addition to Lortab, which she takes chronically. She states that she has chronic back pain and her doctor has referred her to a pain management doctor. It has not happened yet. She has known gastroesophageal reflux disease and continues to smoke cigarettes. She has had diarrhea for 2 days, 3 times each day. That is loose and brown. She has had her gallbladder removed. She denies heavy alcohol use or history of pancreatitis. She has a mild, nonproductive cough. She denies fever, chills, weakness, or dizziness. There are no other known modifying factors.   Past Medical History  Diagnosis Date  . Scoliosis   . Hypertension   . Panic attack   . Back pain   . Depression   . Anxiety   . Ulcer   . Chronic back pain 02/04/13    By patient report   Past Surgical History  Procedure Laterality Date  . Back surgery    . Cesarean section    . Neck surgery    . Tubal ligation    . Cholecystectomy  11/27/2011    Procedure: LAPAROSCOPIC CHOLECYSTECTOMY;  Surgeon: Fabio Bering, MD;  Location: [facility];  Service: General;  Laterality: N/A;   Family History  Problem Relation Age of Onset  . Heart disease    . Arthritis    . Lung disease    . Cancer    . Asthma    . Diabetes     History  Substance Use Topics  . Smoking status: Current Every Day Smoker -- 2.00 packs/day    Types: Cigarettes  .  Smokeless tobacco: Not on file  . Alcohol Use: No   OB History   Grav Para Term Preterm Abortions TAB SAB Ect Mult Living                 Review of Systems  Gastrointestinal: Positive for abdominal pain.  All other systems reviewed and are negative.    Allergies  Penicillins; Ibuprofen; and Aspirin  Home Medications   Current Outpatient Rx  Name  Route  Sig  Dispense  Refill  . albuterol (PROVENTIL HFA;VENTOLIN HFA) 108 (90 BASE) MCG/ACT inhaler   Inhalation   Inhale 2 puffs into the lungs every 6 (six) hours as needed. For shortness of breath         . ALPRAZolam (XANAX) 1 MG tablet   Oral   Take 2 mg by mouth 3 (three) times daily.          . citalopram (CELEXA) 20 MG tablet   Oral   Take 20 mg by mouth daily.          Marland Kitchen gabapentin (NEURONTIN) 300 MG capsule   Oral   Take 300 mg by mouth 3 (three) times daily.          Marland Kitchen HYDROcodone-acetaminophen (NORCO/VICODIN) 5-325 MG per tablet   Oral   Take 1 tablet by mouth every  6 (six) hours as needed for moderate pain.         Marland Kitchen lisinopril-hydrochlorothiazide (PRINZIDE,ZESTORETIC) 20-25 MG per tablet   Oral   Take 1 tablet by mouth daily.          . meloxicam (MOBIC) 15 MG tablet   Oral   Take 15 mg by mouth daily.          . methocarbamol (ROBAXIN) 750 MG tablet   Oral   Take 750 mg by mouth 4 (four) times daily.           . pantoprazole (PROTONIX) 40 MG tablet   Oral   Take 40 mg by mouth 2 (two) times daily.          . potassium chloride (K-DUR) 10 MEQ tablet   Oral   Take 10 mEq by mouth daily.         . temazepam (RESTORIL) 30 MG capsule   Oral   Take 60 mg by mouth at bedtime. For sleep         . sucralfate (CARAFATE) 1 GM/10ML suspension   Oral   Take 10 mLs (1 g total) by mouth 4 (four) times daily -  with meals and at bedtime.   420 mL   0    BP 119/88  Pulse 95  Temp(Src) 97.5 F (36.4 C) (Oral)  Resp 18  SpO2 97% Physical Exam  Nursing note and vitals  reviewed. Constitutional: She is oriented to person, place, and time. She appears well-developed.  She is overweight  HENT:  Head: Normocephalic and atraumatic.  Eyes: Conjunctivae and EOM are normal. Pupils are equal, round, and reactive to light.  Neck: Normal range of motion and phonation normal. Neck supple.  Cardiovascular: Normal rate, regular rhythm and intact distal pulses.   Pulmonary/Chest: Effort normal and breath sounds normal. No respiratory distress. She has no wheezes. She exhibits no tenderness.  Mild nonproductive cough noted during the examination  Abdominal: Soft. She exhibits no distension and no mass. There is tenderness (epigastric, Ma). There is no rebound and no guarding.  Musculoskeletal: Normal range of motion.  There is mild midthoracic tenderness with palpation over the thoracic spine; without associated deformity or skin abnormality  Neurological: She is alert and oriented to person, place, and time. She exhibits normal muscle tone.  Skin: Skin is warm and dry.  Psychiatric: She has a normal mood and affect. Her behavior is normal. Judgment and thought content normal.    ED Course  Procedures (including critical care time)  Medications  sucralfate (CARAFATE) tablet 1 g (not administered)  0.9 %  sodium chloride infusion (not administered)  pantoprazole (PROTONIX) injection 40 mg (40 mg Intravenous Given 02/04/13 2007)  sodium chloride 0.9 % bolus 500 mL ( Intravenous New Bag/Given 02/04/13 2006)  HYDROmorphone (DILAUDID) injection 1 mg (1 mg Intravenous Given 02/04/13 2007)  ondansetron (ZOFRAN) injection 4 mg (4 mg Intravenous Given 02/04/13 2006)    Patient Vitals for the past 24 hrs:  BP Temp Temp src Pulse Resp SpO2  02/04/13 1803 119/88 mmHg 97.5 F (36.4 C) Oral 95 18 97 %    9:28 PM Reevaluation with update and discussion. After initial assessment and treatment, an updated evaluation reveals she feels better. She states that her pain is tolerable at  6/10. The findings were discussed with the patient and her husband. All questions were answered.Flint Melter    Labs Review Labs Reviewed  URINALYSIS, ROUTINE W REFLEX MICROSCOPIC - Abnormal; Notable  for the following:    APPearance HAZY (*)    All other components within normal limits  CBC WITH DIFFERENTIAL - Abnormal; Notable for the following:    WBC 20.0 (*)    RBC 5.14 (*)    Hemoglobin 17.7 (*)    HCT 48.6 (*)    MCH 34.4 (*)    MCHC 36.4 (*)    Neutro Abs 15.4 (*)    Monocytes Absolute 1.2 (*)    All other components within normal limits  LIPASE, BLOOD - Abnormal; Notable for the following:    Lipase 67 (*)    All other components within normal limits  COMPREHENSIVE METABOLIC PANEL - Abnormal; Notable for the following:    Glucose, Bld 113 (*)    Alkaline Phosphatase 179 (*)    GFR calc non Af Amer 62 (*)    GFR calc Af Amer 72 (*)    All other components within normal limits  URINE CULTURE   Imaging Review No results found.  EKG Interpretation   None       MDM   1. Abdominal pain   2. Chronic back pain   3. Gastroesophageal reflux disease   4. Tobacco abuse    Persistent, abdominal pain, despite treatment, and recent evaluation with CT scanning. Today, the evaluation is normal with the exception of an elevated white blood cell count. She does not have a fever. I believe that her leukocytosis is secondary to pain. I doubt serious bacterial infection causing epigastric discomfort.   Nursing Notes Reviewed/ Care Coordinated Applicable Imaging Reviewed Interpretation of Laboratory Data incorporated into ED treatment  The patient appears reasonably screened and/or stabilized for discharge and I doubt any other medical condition or other [facility] requiring further screening, evaluation, or treatment in the ED at this time prior to discharge.  Plan: Home Medications- add Carafate; Home Treatments- rest, fluids; return here if the recommended treatment, does not  improve the symptoms; Recommended follow up- PCP, one week. Encouraged her to seek followup care for her chronic pain, with pain management, Dr.     Flint MelterElliott L Boaz Berisha, MD 02/04/13 2142

## 2013-02-04 NOTE — Discharge Instructions (Signed)
Try to stop smoking since it will make your reflux disease, worse. Try the new medicine for reflux.     Abdominal Pain, Adult Many things can cause abdominal pain. Usually, abdominal pain is not caused by a disease and will improve without treatment. It can often be observed and treated at home. Your health care provider will do a physical exam and possibly order blood tests and X-rays to help determine the seriousness of your pain. However, in many cases, more time must pass before a clear cause of the pain can be found. Before that point, your health care provider may not know if you need more testing or further treatment. HOME CARE INSTRUCTIONS  Monitor your abdominal pain for any changes. The following actions may help to alleviate any discomfort you are experiencing:  Only take over-the-counter or prescription medicines as directed by your health care provider.  Do not take laxatives unless directed to do so by your health care provider.  Try a clear liquid diet (broth, tea, or water) as directed by your health care provider. Slowly move to a bland diet as tolerated. SEEK MEDICAL CARE IF:  You have unexplained abdominal pain.  You have abdominal pain associated with nausea or diarrhea.  You have pain when you urinate or have a bowel movement.  You experience abdominal pain that wakes you in the night.  You have abdominal pain that is worsened or improved by eating food.  You have abdominal pain that is worsened with eating fatty foods. SEEK IMMEDIATE MEDICAL CARE IF:   Your pain does not go away within 2 hours.  You have a fever.  You keep throwing up (vomiting).  Your pain is felt only in portions of the abdomen, such as the right side or the left lower portion of the abdomen.  You pass bloody or black tarry stools. MAKE SURE YOU:  Understand these instructions.   Will watch your condition.   Will get help right away if you are not doing well or get worse.   Document Released: 10/08/2004 Document Revised: 10/19/2012 Document Reviewed: 09/07/2012 [facility] Patient Information 2014 [place], [place].   Chronic Back Pain  When back pain lasts longer than 3 months, it is called chronic back pain.People with chronic back pain often go through certain periods that are more intense (flare-ups).  CAUSES Chronic back pain can be caused by wear and tear (degeneration) on different structures in your back. These structures include:  The bones of your spine (vertebrae) and the joints surrounding your spinal cord and nerve roots (facets).  The strong, fibrous tissues that connect your vertebrae (ligaments). Degeneration of these structures may result in pressure on your nerves. This can lead to constant pain. HOME CARE INSTRUCTIONS  Avoid bending, heavy lifting, prolonged sitting, and activities which make the problem worse.  Take brief periods of rest throughout the day to reduce your pain. Lying down or standing usually is better than sitting while you are resting.  Take over-the-counter or prescription medicines only as directed by your caregiver. SEEK IMMEDIATE MEDICAL CARE IF:   You have weakness or numbness in one of your legs or feet.  You have trouble controlling your bladder or bowels.  You have nausea, vomiting, abdominal pain, shortness of breath, or fainting. Document Released: 02/06/2004 Document Revised: 03/23/2011 Document Reviewed: 12/13/2010 [facility] Patient Information 2014 [place], [place].  Esophagitis Esophagitis is inflammation of the esophagus. It can involve swelling, soreness, and pain in the esophagus. This condition can make it difficult and  painful to swallow. CAUSES  Most causes of esophagitis are not serious. Many different factors can cause esophagitis, including:  Gastroesophageal reflux disease (GERD). This is when acid from your stomach flows up into the esophagus.  Recurrent vomiting.  An allergic-type  reaction.  Certain medicines, especially those that come in large pills.  Ingestion of harmful chemicals, such as household cleaning products.  Heavy alcohol use.  An infection of the esophagus.  Radiation treatment for cancer.  Certain diseases such as sarcoidosis, Crohn's disease, and scleroderma. These diseases may cause recurrent esophagitis. SYMPTOMS   Trouble swallowing.  Painful swallowing.  Chest pain.  Difficulty breathing.  Nausea.  Vomiting.  Abdominal pain. DIAGNOSIS  Your caregiver will take your history and do a physical exam. Depending upon what your caregiver finds, certain tests may also be done, including:  Barium X-ray. You will drink a solution that coats the esophagus, and X-rays will be taken.  Endoscopy. A lighted tube is put down the esophagus so your caregiver can examine the area.  Allergy tests. These can sometimes be arranged through follow-up visits. TREATMENT  Treatment will depend on the cause of your esophagitis. In some cases, steroids or other medicines may be given to help relieve your symptoms or to treat the underlying cause of your condition. Medicines that may be recommended include:  Viscous lidocaine, to soothe the esophagus.  Antacids.  Acid reducers.  Proton pump inhibitors.  Antiviral medicines for certain viral infections of the esophagus.  Antifungal medicines for certain fungal infections of the esophagus.  Antibiotic medicines, depending on the cause of the esophagitis. HOME CARE INSTRUCTIONS   Avoid foods and drinks that seem to make your symptoms worse.  Eat small, frequent meals instead of large meals.  Avoid eating for the 3 hours prior to your bedtime.  If you have trouble taking pills, use a pill splitter to decrease the size and likelihood of the pill getting stuck or injuring the esophagus on the way down. Drinking water after taking a pill also helps.  Stop smoking if you smoke.  Maintain a  healthy weight.  Wear loose-fitting clothing. Do not wear anything tight around your waist that causes pressure on your stomach.  Raise the head of your bed 6 to 8 inches with wood blocks to help you sleep. Extra pillows will not help.  Only take over-the-counter or prescription medicines as directed by your caregiver. SEEK IMMEDIATE MEDICAL CARE IF:  You have severe chest pain that radiates into your arm, neck, or jaw.  You feel sweaty, dizzy, or lightheaded.  You have shortness of breath.  You vomit blood.  You have difficulty or pain with swallowing.  You have bloody or black, tarry stools.  You have a fever.  You have a burning sensation in the chest more than 3 times a week for more than 2 weeks.  You cannot swallow, drink, or eat.  You drool because you cannot swallow your saliva. MAKE SURE YOU:  Understand these instructions.  Will watch your condition.  Will get help right away if you are not doing well or get worse. Document Released: 02/06/2004 Document Revised: 03/23/2011 Document Reviewed: 08/29/2010 [facility] Patient Information 2014 [place], [place].  Smoking Hazards Smoking cigarettes is extremely bad for your health. Tobacco smoke has over 200 known poisons in it. It contains the poisonous gases nitrogen oxide and carbon monoxide. There are over 60 chemicals in tobacco smoke that cause cancer. Some of the chemicals found in cigarette smoke include:  Cyanide.   Benzene.   Formaldehyde.   Methanol (wood alcohol).   Acetylene (fuel used in welding torches).   Ammonia.  Even smoking lightly shortens your life expectancy by several years. You can greatly reduce the risk of medical problems for you and your family by stopping now. Smoking is the most preventable cause of death and disease in our society. Within days of quitting smoking, your circulation improves, you decrease the risk of having a heart attack, and your lung capacity improves. There  may be some increased phlegm in the first few days after quitting, and it may take months for your lungs to clear up completely. Quitting for 10 years reduces your risk of developing lung cancer to almost that of a nonsmoker.  WHAT ARE THE RISKS OF SMOKING? Cigarette smokers have an increased risk of many serious medical problems, including:  Lung cancer.   Lung disease (such as pneumonia, bronchitis, and emphysema).   Heart attack and chest pain due to the heart not getting enough oxygen (angina).   Heart disease and peripheral blood vessel disease.   Hypertension.   Stroke.   Oral cancer (cancer of the lip, mouth, or voice box).   Bladder cancer.   Pancreatic cancer.   Cervical cancer.   Pregnancy complications, including premature birth.   Stillbirths and smaller newborn babies, birth defects, and genetic damage to sperm.   Early menopause.   Lower estrogen level for women.   Infertility.   Facial wrinkles.   Blindness.   Increased risk of broken bones (fractures).   Senile dementia.   Stomach ulcers and internal bleeding.   Delayed wound healing and increased risk of complications during surgery. Because of secondhand smoke exposure, children of smokers have an increased risk of the following:   Sudden infant death syndrome (SIDS).   Respiratory infections.   Lung cancer.   Heart disease.   Ear infections.  WHY IS SMOKING ADDICTIVE? Nicotine is the chemical agent in tobacco that is capable of causing addiction or dependence. When you smoke and inhale, nicotine is absorbed rapidly into the bloodstream through your lungs. Both inhaled and noninhaled nicotine may be addictive.  WHAT ARE THE BENEFITS OF QUITTING?  There are many health benefits to quitting smoking. Some are:   The likelihood of developing cancer and heart disease decreases. Health improvements are seen almost immediately.   Blood pressure, pulse rate, and  breathing patterns start returning to normal soon after quitting.   People who quit may see an improvement in their overall quality of life.  HOW DO YOU QUIT SMOKING? Smoking is an addiction with both physical and psychological effects, and longtime habits can be hard to change. Your health care provider can recommend:  Programs and community resources, which may include group support, education, or therapy.  Replacement products, such as patches, gum, and nasal sprays. Use these products only as directed. Do not replace cigarette smoking with electronic cigarettes (commonly called e-cigarettes). The safety of e-cigarettes is unknown, and some may contain harmful chemicals. FOR MORE INFORMATION  American Lung Association: www.lung.org  American Cancer Society: www.cancer.org Document Released: 02/06/2004 Document Revised: 10/19/2012 Document Reviewed: 06/20/2012 [facility] Patient Information 2014 [place]

## 2013-02-04 NOTE — ED Notes (Signed)
Pt c/o upper abdominal pain and nausea x1 week. Denies v/d.

## 2013-02-06 LAB — URINE CULTURE

## 2013-02-08 NOTE — ED Provider Notes (Signed)
CSN: [id number]     Arrival date & time 01/30/13  0114 History   First MD Initiated Contact with Patient 01/30/13 0154     Chief Complaint  Patient presents with  . Abdominal Cramping   (Consider location/radiation/quality/duration/timing/severity/associated sxs/prior Treatment) HPI  43yF with abdominal pain. Lower abdomen, worse llq. Onset yesterday. Gradual onset. Constant since. Deep, boring pain. No appreciable exacerbating or relieving factors. No fever or chills. No unusual vaginal bleeding or discharge. No urinary complaints. Takes norco for other pain issues and not helping. No n/v.   Past Medical History  Diagnosis Date  . Scoliosis   . Hypertension   . Panic attack   . Back pain   . Depression   . Anxiety   . Ulcer   . Chronic back pain 02/04/13    By patient report   Past Surgical History  Procedure Laterality Date  . Back surgery    . Cesarean section    . Neck surgery    . Tubal ligation    . Cholecystectomy  11/27/2011    Procedure: LAPAROSCOPIC CHOLECYSTECTOMY;  Surgeon: Fabio Bering, MD;  Location: [facility];  Service: General;  Laterality: N/A;   Family History  Problem Relation Age of Onset  . Heart disease    . Arthritis    . Lung disease    . Cancer    . Asthma    . Diabetes     History  Substance Use Topics  . Smoking status: Current Every Day Smoker -- 2.00 packs/day    Types: Cigarettes  . Smokeless tobacco: Not on file  . Alcohol Use: No   OB History   Grav Para Term Preterm Abortions TAB SAB Ect Mult Living                 Review of Systems  All systems reviewed and negative, other than as noted in HPI.   Allergies  Penicillins; Ibuprofen; and Aspirin  Home Medications   Current Outpatient Rx  Name  Route  Sig  Dispense  Refill  . albuterol (PROVENTIL HFA;VENTOLIN HFA) 108 (90 BASE) MCG/ACT inhaler   Inhalation   Inhale 2 puffs into the lungs every 6 (six) hours as needed. For shortness of breath         . ALPRAZolam  (XANAX) 1 MG tablet   Oral   Take 2 mg by mouth 3 (three) times daily.          . citalopram (CELEXA) 20 MG tablet   Oral   Take 20 mg by mouth daily.          Marland Kitchen gabapentin (NEURONTIN) 300 MG capsule   Oral   Take 300 mg by mouth 3 (three) times daily.          Marland Kitchen HYDROcodone-acetaminophen (NORCO/VICODIN) 5-325 MG per tablet   Oral   Take 1 tablet by mouth every 6 (six) hours as needed for moderate pain.         Marland Kitchen lisinopril-hydrochlorothiazide (PRINZIDE,ZESTORETIC) 20-25 MG per tablet   Oral   Take 1 tablet by mouth daily.          . meloxicam (MOBIC) 15 MG tablet   Oral   Take 15 mg by mouth daily.          . methocarbamol (ROBAXIN) 750 MG tablet   Oral   Take 750 mg by mouth 4 (four) times daily.           . pantoprazole (PROTONIX) 40 MG  tablet   Oral   Take 40 mg by mouth 2 (two) times daily.          . potassium chloride (K-DUR) 10 MEQ tablet   Oral   Take 10 mEq by mouth daily.         . sucralfate (CARAFATE) 1 GM/10ML suspension   Oral   Take 10 mLs (1 g total) by mouth 4 (four) times daily -  with meals and at bedtime.   420 mL   0   . temazepam (RESTORIL) 30 MG capsule   Oral   Take 60 mg by mouth at bedtime. For sleep          BP 109/67  Pulse 68  Temp(Src) 97.7 F (36.5 C) (Oral)  Resp 18  Ht 5\' 2"  (1.575 m)  Wt 210 lb (95.255 kg)  BMI 38.40 kg/m2  SpO2 95% Physical Exam  Nursing note and vitals reviewed. Constitutional: She appears well-developed and well-nourished. No distress.  HENT:  Head: Normocephalic and atraumatic.  Eyes: Conjunctivae are normal. Right eye exhibits no discharge. Left eye exhibits no discharge.  Neck: Neck supple.  Cardiovascular: Normal rate, regular rhythm and normal heart sounds.  Exam reveals no gallop and no friction rub.   No murmur heard. Pulmonary/Chest: Effort normal and breath sounds normal. No respiratory distress.  Abdominal: Soft. She exhibits no distension. There is tenderness.  llq  tenderness w/o rebound or guarding. No distension,.   Genitourinary:  No cva tenderness  Musculoskeletal: She exhibits no edema and no tenderness.  Neurological: She is alert.  Skin: Skin is warm and dry.  Psychiatric: She has a normal mood and affect. Her behavior is normal. Thought content normal.    ED Course  Procedures (including critical care time) Labs Review Labs Reviewed  URINALYSIS, ROUTINE W REFLEX MICROSCOPIC - Abnormal; Notable for the following:    Specific Gravity, Urine >1.030 (*)    Bilirubin Urine SMALL (*)    All other components within normal limits  CBC WITH DIFFERENTIAL - Abnormal; Notable for the following:    WBC 12.2 (*)    Hemoglobin 15.8 (*)    MCH 35.0 (*)    MCHC 37.1 (*)    Neutro Abs 8.1 (*)    Monocytes Absolute 1.3 (*)    All other components within normal limits  BASIC METABOLIC PANEL - Abnormal; Notable for the following:    Glucose, Bld 131 (*)    Creatinine, Ser 1.49 (*)    GFR calc non Af Amer 42 (*)    GFR calc Af Amer 49 (*)    All other components within normal limits  PREGNANCY, URINE   Imaging Review No results found.  EKG Interpretation   None       MDM   1. Abdominal pain    43yF with abdominal pain. Low suspicion for SBI or emergent surgical process. W/u reassuring. No peritonitis. Afebrile. Leukocytosis, but nonspecific. Symptomatic tx. Already on ppi which takes bid.    Raeford RazorStephen Aveon Colquhoun, MD 02/08/13 1421

## 2013-02-21 ENCOUNTER — Emergency Department (HOSPITAL_COMMUNITY)
Admission: EM | Admit: 2013-02-21 | Discharge: 2013-02-21 | Disposition: A | Discharge status: Home / Self Care | Payer: Medicaid Other | Attending: Emergency Medicine | Admitting: Emergency Medicine

## 2013-02-21 ENCOUNTER — Emergency Department (HOSPITAL_COMMUNITY): Payer: Medicaid Other

## 2013-02-21 ENCOUNTER — Encounter (HOSPITAL_COMMUNITY): Payer: Self-pay | Admitting: Emergency Medicine

## 2013-02-21 DIAGNOSIS — R1013 Epigastric pain: Secondary | ICD-10-CM | POA: Insufficient documentation

## 2013-02-21 DIAGNOSIS — F329 Major depressive disorder, single episode, unspecified: Secondary | ICD-10-CM | POA: Insufficient documentation

## 2013-02-21 DIAGNOSIS — G8929 Other chronic pain: Secondary | ICD-10-CM | POA: Insufficient documentation

## 2013-02-21 DIAGNOSIS — Z8739 Personal history of other diseases of the musculoskeletal system and connective tissue: Secondary | ICD-10-CM | POA: Insufficient documentation

## 2013-02-21 DIAGNOSIS — Z9089 Acquired absence of other organs: Secondary | ICD-10-CM | POA: Insufficient documentation

## 2013-02-21 DIAGNOSIS — Z9889 Other specified postprocedural states: Secondary | ICD-10-CM | POA: Insufficient documentation

## 2013-02-21 DIAGNOSIS — F3289 Other specified depressive episodes: Secondary | ICD-10-CM | POA: Insufficient documentation

## 2013-02-21 DIAGNOSIS — Z8742 Personal history of other diseases of the female genital tract: Secondary | ICD-10-CM | POA: Insufficient documentation

## 2013-02-21 DIAGNOSIS — R197 Diarrhea, unspecified: Secondary | ICD-10-CM | POA: Insufficient documentation

## 2013-02-21 DIAGNOSIS — I10 Essential (primary) hypertension: Secondary | ICD-10-CM | POA: Insufficient documentation

## 2013-02-21 DIAGNOSIS — Z79899 Other long term (current) drug therapy: Secondary | ICD-10-CM | POA: Insufficient documentation

## 2013-02-21 DIAGNOSIS — Z872 Personal history of diseases of the skin and subcutaneous tissue: Secondary | ICD-10-CM | POA: Insufficient documentation

## 2013-02-21 DIAGNOSIS — F17200 Nicotine dependence, unspecified, uncomplicated: Secondary | ICD-10-CM | POA: Insufficient documentation

## 2013-02-21 DIAGNOSIS — F410 Panic disorder [episodic paroxysmal anxiety] without agoraphobia: Secondary | ICD-10-CM | POA: Insufficient documentation

## 2013-02-21 DIAGNOSIS — Z791 Long term (current) use of non-steroidal anti-inflammatories (NSAID): Secondary | ICD-10-CM | POA: Insufficient documentation

## 2013-02-21 DIAGNOSIS — R112 Nausea with vomiting, unspecified: Secondary | ICD-10-CM | POA: Insufficient documentation

## 2013-02-21 DIAGNOSIS — R109 Unspecified abdominal pain: Secondary | ICD-10-CM

## 2013-02-21 LAB — COMPREHENSIVE METABOLIC PANEL
ALT: 22 U/L (ref 0–35)
AST: 13 U/L (ref 0–37)
Albumin: 3.8 g/dL (ref 3.5–5.2)
Alkaline Phosphatase: 206 U/L — ABNORMAL HIGH (ref 39–117)
BUN: 10 mg/dL (ref 6–23)
CO2: 22 meq/L (ref 19–32)
Calcium: 9.6 mg/dL (ref 8.4–10.5)
Chloride: 102 mEq/L (ref 96–112)
Creatinine, Ser: 1 mg/dL (ref 0.50–1.10)
GFR calc Af Amer: 79 mL/min — ABNORMAL LOW (ref >90)
GFR calc non Af Amer: 68 mL/min — ABNORMAL LOW (ref >90)
GLUCOSE: 128 mg/dL — AB (ref 70–99)
Potassium: 3.8 mEq/L (ref 3.7–5.3)
SODIUM: 139 meq/L (ref 137–147)
Total Bilirubin: 0.4 mg/dL (ref 0.3–1.2)
Total Protein: 8.2 g/dL (ref 6.0–8.3)

## 2013-02-21 LAB — CBC WITH DIFFERENTIAL/PLATELET
BASOS ABS: 0 10*3/uL (ref 0.0–0.1)
BASOS PCT: 0 % (ref 0–1)
Eosinophils Absolute: 0 10*3/uL (ref 0.0–0.7)
Eosinophils Relative: 0 % (ref 0–5)
HCT: 46.7 % — ABNORMAL HIGH (ref 36.0–46.0)
HEMOGLOBIN: 16.6 g/dL — AB (ref 12.0–15.0)
Lymphocytes Relative: 13 % (ref 12–46)
Lymphs Abs: 1.9 10*3/uL (ref 0.7–4.0)
MCH: 34.2 pg — ABNORMAL HIGH (ref 26.0–34.0)
MCHC: 35.5 g/dL (ref 30.0–36.0)
MCV: 96.3 fL (ref 78.0–100.0)
MONOS PCT: 6 % (ref 3–12)
Monocytes Absolute: 0.9 10*3/uL (ref 0.1–1.0)
NEUTROS ABS: 11.5 10*3/uL — AB (ref 1.7–7.7)
NEUTROS PCT: 80 % — AB (ref 43–77)
Platelets: 266 10*3/uL (ref 150–400)
RBC: 4.85 MIL/uL (ref 3.87–5.11)
RDW: 13.6 % (ref 11.5–15.5)
WBC: 14.4 10*3/uL — AB (ref 4.0–10.5)

## 2013-02-21 LAB — LIPASE, BLOOD: Lipase: 58 U/L (ref 11–59)

## 2013-02-21 MED ORDER — IOHEXOL 300 MG/ML  SOLN
50.0000 mL | Freq: Once | INTRAMUSCULAR | Status: AC | PRN
  Administered 2013-02-21: 50 mL via ORAL

## 2013-02-21 MED ORDER — OXYCODONE-ACETAMINOPHEN 5-325 MG PO TABS: 2.0000 | ORAL_TABLET | ORAL | Status: DC | PRN

## 2013-02-21 MED ORDER — SODIUM CHLORIDE 0.9 % IV BOLUS (SEPSIS)
1000.0000 mL | Freq: Once | INTRAVENOUS | Status: AC
  Administered 2013-02-21: 1000 mL via INTRAVENOUS

## 2013-02-21 MED ORDER — SUCRALFATE 1 G PO TABS
ORAL_TABLET | ORAL | Status: AC
  Filled 2013-02-21: qty 1

## 2013-02-21 MED ORDER — SUCRALFATE 1 G PO TABS
1.0000 g | ORAL_TABLET | Freq: Three times a day (TID) | ORAL | Status: DC
  Administered 2013-02-21: 1 g via ORAL
  Filled 2013-02-21 (×7): qty 1

## 2013-02-21 MED ORDER — PROMETHAZINE HCL 25 MG PO TABS: 25.0000 mg | ORAL_TABLET | Freq: Four times a day (QID) | ORAL | Status: DC | PRN

## 2013-02-21 MED ORDER — SUCRALFATE 1 G PO TABS: 1.0000 g | ORAL_TABLET | Freq: Three times a day (TID) | ORAL | Status: DC

## 2013-02-21 MED ORDER — IOHEXOL 300 MG/ML  SOLN
100.0000 mL | Freq: Once | INTRAMUSCULAR | Status: AC | PRN
  Administered 2013-02-21: 100 mL via INTRAVENOUS

## 2013-02-21 MED ORDER — ONDANSETRON HCL 4 MG/2ML IJ SOLN
4.0000 mg | Freq: Once | INTRAMUSCULAR | Status: AC
  Administered 2013-02-21: 4 mg via INTRAVENOUS
  Filled 2013-02-21: qty 2

## 2013-02-21 MED ORDER — MORPHINE SULFATE 4 MG/ML IJ SOLN
4.0000 mg | Freq: Once | INTRAMUSCULAR | Status: AC
  Administered 2013-02-21: 4 mg via INTRAVENOUS
  Filled 2013-02-21: qty 1

## 2013-02-21 MED ORDER — PANTOPRAZOLE SODIUM 40 MG IV SOLR
40.0000 mg | Freq: Once | INTRAVENOUS | Status: AC
  Administered 2013-02-21: 40 mg via INTRAVENOUS
  Filled 2013-02-21: qty 40

## 2013-02-21 NOTE — ED Notes (Signed)
Patient c/o upper abd pain with nausea, vomiting, and diarrhea. Denies any fevers or urinary symptoms. Per patient was given Carafate last time when this happened and states that it helped. Patient states "My doctor wouldn't refill it without me seeing him first."

## 2013-02-21 NOTE — ED Notes (Signed)
Pt drinking contrast. 

## 2013-02-21 NOTE — ED Notes (Signed)
Patient given discharge instruction, verbalized understand. IV removed, band aid applied. Patient ambulatory out of the department.  

## 2013-02-21 NOTE — ED Notes (Signed)
Pt ambulatory to bathroom multiple time, states she is have diarrhea

## 2013-02-21 NOTE — ED Provider Notes (Signed)
CSN: [id number]     Arrival date & time 02/21/13  1427 History  This chart was scribed for Donnetta Hutching, MD,  by Ashley Jacobs, ED Scribe. The patient was seen in room [facility] and the patient's care was started at 3:46 PM.    First MD Initiated Contact with Patient 02/21/13 1443     Chief Complaint  Patient presents with  . Abdominal Pain     (Consider location/radiation/quality/duration/timing/severity/associated sxs/prior Treatment) Patient is a 43 y.o. female presenting with abdominal pain. The history is provided by the patient and medical records. No language interpreter was used.  Abdominal Pain Pain location:  Epigastric Associated symptoms: diarrhea, nausea and vomiting   Associated symptoms: no dysuria and no fever    HPI Comments: [name] is a 43 y.o. female who presents to the Emergency Department complaining of epigastric pain.The pain is described as "stabbing" that radiates to her back. Pt has the associated symptom of diarrhea and emesis. She mentions that she is unable to retain food. Pt started her mense yesterday but reports having an irregular menstrual cycle. Pt had a cholecystectomy. She does not drink alcohol. Pt was administered Carafate during her last episodes but she was unable to get a refill without setting an appointment. Denies dysuria and difficulty urinating. Denies fever.  Past Medical History  Diagnosis Date  . Scoliosis   . Hypertension   . Panic attack   . Back pain   . Depression   . Anxiety   . Ulcer   . Chronic back pain 02/04/13    By patient report   Past Surgical History  Procedure Laterality Date  . Back surgery    . Cesarean section    . Neck surgery    . Tubal ligation    . Cholecystectomy  11/27/2011    Procedure: LAPAROSCOPIC CHOLECYSTECTOMY;  Surgeon: Fabio Bering, MD;  Location: [facility];  Service: General;  Laterality: N/A;   Family History  Problem Relation Age of Onset  . Heart disease    . Arthritis    .  Lung disease    . Cancer    . Asthma    . Diabetes     History  Substance Use Topics  . Smoking status: Current Every Day Smoker -- 2.00 packs/day for 25 years    Types: Cigarettes  . Smokeless tobacco: Never Used  . Alcohol Use: No   OB History   Grav Para Term Preterm Abortions TAB SAB Ect Mult Living   4 3 3  1     3      Review of Systems  Constitutional: Negative for fever.  Gastrointestinal: Positive for nausea, vomiting, abdominal pain and diarrhea.  Genitourinary: Negative for dysuria and difficulty urinating.  Musculoskeletal: Positive for back pain.  All other systems reviewed and are negative.      Allergies  Penicillins; Ibuprofen; and Aspirin  Home Medications   Current Outpatient Rx  Name  Route  Sig  Dispense  Refill  . albuterol (PROVENTIL HFA;VENTOLIN HFA) 108 (90 BASE) MCG/ACT inhaler   Inhalation   Inhale 2 puffs into the lungs every 6 (six) hours as needed. For shortness of breath         . ALPRAZolam (XANAX) 1 MG tablet   Oral   Take 2 mg by mouth 3 (three) times daily.          . citalopram (CELEXA) 20 MG tablet   Oral   Take 20 mg by mouth daily.          Marland Kitchen  gabapentin (NEURONTIN) 300 MG capsule   Oral   Take 300 mg by mouth 3 (three) times daily.          Marland Kitchen HYDROcodone-acetaminophen (NORCO/VICODIN) 5-325 MG per tablet   Oral   Take 1 tablet by mouth every 6 (six) hours as needed for moderate pain.         Marland Kitchen lisinopril-hydrochlorothiazide (PRINZIDE,ZESTORETIC) 20-25 MG per tablet   Oral   Take 1 tablet by mouth daily.          . meloxicam (MOBIC) 15 MG tablet   Oral   Take 15 mg by mouth daily.          . methocarbamol (ROBAXIN) 750 MG tablet   Oral   Take 750 mg by mouth 4 (four) times daily.           . pantoprazole (PROTONIX) 40 MG tablet   Oral   Take 40 mg by mouth 2 (two) times daily.          . potassium chloride (K-DUR) 10 MEQ tablet   Oral   Take 10 mEq by mouth daily.         . sucralfate  (CARAFATE) 1 GM/10ML suspension   Oral   Take 10 mLs (1 g total) by mouth 4 (four) times daily -  with meals and at bedtime.   420 mL   0   . temazepam (RESTORIL) 30 MG capsule   Oral   Take 60 mg by mouth at bedtime. For sleep         . oxyCODONE-acetaminophen (PERCOCET) 5-325 MG per tablet   Oral   Take 2 tablets by mouth every 4 (four) hours as needed.   12 tablet   0   . promethazine (PHENERGAN) 25 MG tablet   Oral   Take 1 tablet (25 mg total) by mouth every 6 (six) hours as needed for nausea or vomiting.   6 tablet   0   . sucralfate (CARAFATE) 1 G tablet   Oral   Take 1 tablet (1 g total) by mouth 4 (four) times daily -  with meals and at bedtime.   120 tablet   0    BP 118/69  Pulse 78  Temp(Src) 98.1 F (36.7 C) (Oral)  Resp 18  Ht 5\' 2"  (1.575 m)  Wt 210 lb (95.255 kg)  BMI 38.40 kg/m2  SpO2 98%  LMP 02/21/2013 Physical Exam  Abdominal: There is tenderness in the epigastric area.    ED Course  Procedures (including critical care time). DIAGNOSTIC STUDIES: Oxygen Saturation is 95% on room air, normal by my interpretation.    COORDINATION OF CARE:  3:50 PM Discussed course of care with pt . Pt understands and agrees.   Labs Review Labs Reviewed  COMPREHENSIVE METABOLIC PANEL - Abnormal; Notable for the following:    Glucose, Bld 128 (*)    Alkaline Phosphatase 206 (*)    GFR calc non Af Amer 68 (*)    GFR calc Af Amer 79 (*)    All other components within normal limits  CBC WITH DIFFERENTIAL - Abnormal; Notable for the following:    WBC 14.4 (*)    Hemoglobin 16.6 (*)    HCT 46.7 (*)    MCH 34.2 (*)    Neutrophils Relative % 80 (*)    Neutro Abs 11.5 (*)    All other components within normal limits  LIPASE, BLOOD   Imaging Review Ct Abdomen Pelvis W Contrast  02/21/2013  CLINICAL DATA:  Epigastric pain, nausea and vomiting  EXAM: CT ABDOMEN AND PELVIS WITH CONTRAST  TECHNIQUE: Multidetector CT imaging of the abdomen and pelvis was  performed using the standard protocol following bolus administration of intravenous contrast.  CONTRAST:  100mL OMNIPAQUE IOHEXOL 300 MG/ML  SOLN  COMPARISON:  January 30, 2013  FINDINGS: There is diffuse fatty infiltration of liver. Patient is status post prior cholecystectomy. The common bile duct measures 8 mm, postsurgical dilatation. The spleen, pancreas, adrenal glands and kidneys are normal. There is left parapelvic renal cysts. The aorta is normal. There is no abdominal lymphadenopathy. There is no small bowel obstruction or diverticulitis. The appendix is normal.  Partial decompressed bladder demonstrate no abnormality. The uterus is normal. There are small cysts in normal size bilateral ovaries. The lung bases are clear. Patient is status post prior posterior fusion of the lower lumbar spine without malalignment.  IMPRESSION: No acute abnormality identified in the abdomen and pelvis. There is no small bowel obstruction. The appendix is normal.   Electronically Signed   By: Sherian ReinWei-Chen  Lin M.D.   On: 02/21/2013 18:19   Dg Abd Acute W/chest  02/21/2013   CLINICAL DATA:  Epigastric pain with nausea, vomiting, diarrhea for several days. History of hypertension, tubal ligation, cholecystectomy. Smoker.  EXAM: ACUTE ABDOMEN SERIES (ABDOMEN 2 VIEW & CHEST 1 VIEW)  COMPARISON:  None  FINDINGS: Heart size is normal. The lungs are clear. No edema. No free air beneath the diaphragm. The patient has had previous cervical fusion.  Supine and erect views of the abdomen show mild dilatation of small bowel loops. There is paucity of large bowel gas.  Previous lumbar spine fusion.  IMPRESSION: 1. Findings consistent with early or partial small bowel obstruction. 2. No free intraperitoneal air. 3.  No evidence for acute cardiopulmonary abnormality.   Electronically Signed   By: Rosalie GumsBeth  Brown M.D.   On: 02/21/2013 16:58    EKG Interpretation   None       MDM  I personally performed the services described in this  documentation, which was scribed in my presence. The recorded information has been reviewed and is accurate.    Final diagnoses:  Abdominal pain   No acute abdomen. CT scan shows no acute anomalies.   Alkaline phosphatase noted to be slightly elevated in the past.   Discharge medications Carafate, Percocet, Phenergan 25 mg     Donnetta HutchingBrian Zelie Asbill, MD 02/21/13 [id number]

## 2013-02-21 NOTE — Discharge Instructions (Signed)
Abdominal Pain, Adult Many things can cause belly (abdominal) pain. Most times, the belly pain is not dangerous. Many cases of belly pain can be watched and treated at home. HOME CARE   Do not take medicines that help you go poop (laxatives) unless told to by your doctor.  Only take medicine as told by your doctor.  Eat or drink as told by your doctor. Your doctor will tell you if you should be on a special diet. GET HELP IF:  You do not know what is causing your belly pain.  You have belly pain while you are sick to your stomach (nauseous) or have runny poop (diarrhea).  You have pain while you pee or poop.  Your belly pain wakes you up at night.  You have belly pain that gets worse or better when you eat.  You have belly pain that gets worse when you eat fatty foods. GET HELP RIGHT AWAY IF:   The pain does not go away within 2 hours.  You have a fever.  You keep throwing up (vomiting).  The pain changes and is only in the right or left part of the belly.  You have bloody or tarry looking poop. MAKE SURE YOU:   Understand these instructions.  Will watch your condition.  Will get help right away if you are not doing well or get worse. Document Released: 06/17/2007 Document Revised: 10/19/2012 Document Reviewed: 09/07/2012 [facility] Patient Information 2014 [place], [place].   CT scan shows no acute findings. Prescription for Carafate, pain meds, nausea meds.     Followup your primary care Dr.

## 2013-03-21 ENCOUNTER — Encounter (HOSPITAL_COMMUNITY): Payer: Self-pay | Admitting: Emergency Medicine

## 2013-03-21 ENCOUNTER — Emergency Department (HOSPITAL_COMMUNITY)
Admission: EM | Admit: 2013-03-21 | Discharge: 2013-03-21 | Disposition: A | Discharge status: Home / Self Care | Payer: Medicaid Other | Attending: Emergency Medicine | Admitting: Emergency Medicine

## 2013-03-21 DIAGNOSIS — F17200 Nicotine dependence, unspecified, uncomplicated: Secondary | ICD-10-CM | POA: Insufficient documentation

## 2013-03-21 DIAGNOSIS — Z9089 Acquired absence of other organs: Secondary | ICD-10-CM | POA: Insufficient documentation

## 2013-03-21 DIAGNOSIS — F410 Panic disorder [episodic paroxysmal anxiety] without agoraphobia: Secondary | ICD-10-CM | POA: Insufficient documentation

## 2013-03-21 DIAGNOSIS — Z8719 Personal history of other diseases of the digestive system: Secondary | ICD-10-CM | POA: Insufficient documentation

## 2013-03-21 DIAGNOSIS — Z880 Allergy status to penicillin: Secondary | ICD-10-CM | POA: Insufficient documentation

## 2013-03-21 DIAGNOSIS — Z79899 Other long term (current) drug therapy: Secondary | ICD-10-CM | POA: Insufficient documentation

## 2013-03-21 DIAGNOSIS — F329 Major depressive disorder, single episode, unspecified: Secondary | ICD-10-CM | POA: Insufficient documentation

## 2013-03-21 DIAGNOSIS — F3289 Other specified depressive episodes: Secondary | ICD-10-CM | POA: Insufficient documentation

## 2013-03-21 DIAGNOSIS — Z791 Long term (current) use of non-steroidal anti-inflammatories (NSAID): Secondary | ICD-10-CM | POA: Insufficient documentation

## 2013-03-21 DIAGNOSIS — Z9851 Tubal ligation status: Secondary | ICD-10-CM | POA: Insufficient documentation

## 2013-03-21 DIAGNOSIS — I10 Essential (primary) hypertension: Secondary | ICD-10-CM | POA: Insufficient documentation

## 2013-03-21 DIAGNOSIS — G8929 Other chronic pain: Secondary | ICD-10-CM | POA: Insufficient documentation

## 2013-03-21 DIAGNOSIS — R109 Unspecified abdominal pain: Secondary | ICD-10-CM

## 2013-03-21 DIAGNOSIS — Z8739 Personal history of other diseases of the musculoskeletal system and connective tissue: Secondary | ICD-10-CM | POA: Insufficient documentation

## 2013-03-21 DIAGNOSIS — R1013 Epigastric pain: Secondary | ICD-10-CM | POA: Insufficient documentation

## 2013-03-21 DIAGNOSIS — R112 Nausea with vomiting, unspecified: Secondary | ICD-10-CM | POA: Insufficient documentation

## 2013-03-21 DIAGNOSIS — R197 Diarrhea, unspecified: Secondary | ICD-10-CM | POA: Insufficient documentation

## 2013-03-21 DIAGNOSIS — R1110 Vomiting, unspecified: Secondary | ICD-10-CM

## 2013-03-21 LAB — URINALYSIS, ROUTINE W REFLEX MICROSCOPIC
Bilirubin Urine: NEGATIVE
Glucose, UA: NEGATIVE mg/dL
KETONES UR: NEGATIVE mg/dL
LEUKOCYTES UA: NEGATIVE
Nitrite: NEGATIVE
PH: 5.5 (ref 5.0–8.0)
Protein, ur: NEGATIVE mg/dL
Specific Gravity, Urine: >1.03 — ABNORMAL HIGH (ref 1.005–1.030)
Urobilinogen, UA: 0.2 mg/dL (ref 0.0–1.0)

## 2013-03-21 LAB — COMPREHENSIVE METABOLIC PANEL
ALBUMIN: 4 g/dL (ref 3.5–5.2)
ALK PHOS: 228 U/L — AB (ref 39–117)
ALT: 17 U/L (ref 0–35)
AST: 18 U/L (ref 0–37)
BILIRUBIN TOTAL: 0.4 mg/dL (ref 0.3–1.2)
BUN: 21 mg/dL (ref 6–23)
CHLORIDE: 101 meq/L (ref 96–112)
CO2: 19 mEq/L (ref 19–32)
CREATININE: 1.4 mg/dL — AB (ref 0.50–1.10)
Calcium: 9.5 mg/dL (ref 8.4–10.5)
GFR calc Af Amer: 53 mL/min — ABNORMAL LOW (ref >90)
GFR calc non Af Amer: 46 mL/min — ABNORMAL LOW (ref >90)
Glucose, Bld: 145 mg/dL — ABNORMAL HIGH (ref 70–99)
POTASSIUM: 4 meq/L (ref 3.7–5.3)
Sodium: 138 mEq/L (ref 137–147)
Total Protein: 8.6 g/dL — ABNORMAL HIGH (ref 6.0–8.3)

## 2013-03-21 LAB — URINE MICROSCOPIC-ADD ON

## 2013-03-21 LAB — CBC WITH DIFFERENTIAL/PLATELET
Basophils Absolute: 0 10*3/uL (ref 0.0–0.1)
Basophils Relative: 0 % (ref 0–1)
EOS PCT: 1 % (ref 0–5)
Eosinophils Absolute: 0.2 10*3/uL (ref 0.0–0.7)
HCT: 50.6 % — ABNORMAL HIGH (ref 36.0–46.0)
Hemoglobin: 18.4 g/dL — ABNORMAL HIGH (ref 12.0–15.0)
Lymphocytes Relative: 18 % (ref 12–46)
Lymphs Abs: 2.8 10*3/uL (ref 0.7–4.0)
MCH: 33.9 pg (ref 26.0–34.0)
MCHC: 36.4 g/dL — ABNORMAL HIGH (ref 30.0–36.0)
MCV: 93.4 fL (ref 78.0–100.0)
MONOS PCT: 8 % (ref 3–12)
Monocytes Absolute: 1.3 10*3/uL — ABNORMAL HIGH (ref 0.1–1.0)
NEUTROS PCT: 73 % (ref 43–77)
Neutro Abs: 11.5 10*3/uL — ABNORMAL HIGH (ref 1.7–7.7)
PLATELETS: 327 10*3/uL (ref 150–400)
RBC: 5.42 MIL/uL — AB (ref 3.87–5.11)
RDW: 13.1 % (ref 11.5–15.5)
WBC: 15.8 10*3/uL — AB (ref 4.0–10.5)

## 2013-03-21 LAB — LIPASE, BLOOD: Lipase: 69 U/L — ABNORMAL HIGH (ref 11–59)

## 2013-03-21 MED ORDER — SODIUM CHLORIDE 0.9 % IV BOLUS (SEPSIS)
1000.0000 mL | Freq: Once | INTRAVENOUS | Status: AC
  Administered 2013-03-21: 1000 mL via INTRAVENOUS

## 2013-03-21 MED ORDER — ONDANSETRON HCL 4 MG/2ML IJ SOLN
4.0000 mg | Freq: Once | INTRAMUSCULAR | Status: AC
  Administered 2013-03-21: 4 mg via INTRAVENOUS
  Filled 2013-03-21: qty 2

## 2013-03-21 MED ORDER — MORPHINE SULFATE 4 MG/ML IJ SOLN
4.0000 mg | Freq: Once | INTRAMUSCULAR | Status: AC
  Administered 2013-03-21: 4 mg via INTRAVENOUS
  Filled 2013-03-21: qty 1

## 2013-03-21 MED ORDER — DIPHENOXYLATE-ATROPINE 2.5-0.025 MG PO TABS: 2.0000 | ORAL_TABLET | Freq: Four times a day (QID) | ORAL | Status: DC | PRN

## 2013-03-21 MED ORDER — PROMETHAZINE HCL 25 MG PO TABS: 25.0000 mg | ORAL_TABLET | Freq: Four times a day (QID) | ORAL | Status: DC | PRN

## 2013-03-21 MED ORDER — DIPHENOXYLATE-ATROPINE 2.5-0.025 MG PO TABS
2.0000 | ORAL_TABLET | Freq: Once | ORAL | Status: AC
  Administered 2013-03-21: 2 via ORAL
  Filled 2013-03-21: qty 2

## 2013-03-21 NOTE — Discharge Instructions (Signed)
Abdominal Pain, Adult °Many things can cause abdominal pain. Usually, abdominal pain is not caused by a disease and will improve without treatment. It can often be observed and treated at home. Your health care provider will do a physical exam and possibly order blood tests and X-rays to help determine the seriousness of your pain. However, in many cases, more time must pass before a clear cause of the pain can be found. Before that point, your health care provider may not know if you need more testing or further treatment. °HOME CARE INSTRUCTIONS  °Monitor your abdominal pain for any changes. The following actions may help to alleviate any discomfort you are experiencing: °· Only take over-the-counter or prescription medicines as directed by your health care provider. °· Do not take laxatives unless directed to do so by your health care provider. °· Try a clear liquid diet (broth, tea, or water) as directed by your health care provider. Slowly move to a bland diet as tolerated. °SEEK MEDICAL CARE IF: °· You have unexplained abdominal pain. °· You have abdominal pain associated with nausea or diarrhea. °· You have pain when you urinate or have a bowel movement. °· You experience abdominal pain that wakes you in the night. °· You have abdominal pain that is worsened or improved by eating food. °· You have abdominal pain that is worsened with eating fatty foods. °SEEK IMMEDIATE MEDICAL CARE IF:  °· Your pain does not go away within 2 hours. °· You have a fever. °· You keep throwing up (vomiting). °· Your pain is felt only in portions of the abdomen, such as the right side or the left lower portion of the abdomen. °· You pass bloody or black tarry stools. °MAKE SURE YOU: °· Understand these instructions.   °· Will watch your condition.   °· Will get help right away if you are not doing well or get worse.   °Document Released: 10/08/2004 Document Revised: 10/19/2012 Document Reviewed: 09/07/2012 °[employer]® Patient  Information ©2014 [employer]. ° °Diarrhea °Diarrhea is frequent loose and watery bowel movements. It can cause you to feel weak and dehydrated. Dehydration can cause you to become tired and thirsty, have a dry mouth, and have decreased urination that often is dark yellow. Diarrhea is a sign of another problem, most often an infection that will not last long. In most cases, diarrhea typically lasts 2 3 days. However, it can last longer if it is a sign of something more serious. It is important to treat your diarrhea as directed by your caregive to lessen or prevent future episodes of diarrhea. °CAUSES  °Some common causes include: °· Gastrointestinal infections caused by viruses, bacteria, or parasites. °· Food poisoning or food allergies. °· Certain medicines, such as antibiotics, chemotherapy, and laxatives. °· Artificial sweeteners and fructose. °· Digestive disorders. °HOME CARE INSTRUCTIONS °· Ensure adequate fluid intake (hydration): have 1 cup (8 oz) of fluid for each diarrhea episode. Avoid fluids that contain simple sugars or sports drinks, fruit juices, whole milk products, and sodas. Your urine should be clear or pale yellow if you are drinking enough fluids. Hydrate with an oral rehydration solution that you can purchase at pharmacies, retail stores, and online. You can prepare an oral rehydration solution at home by mixing the following ingredients together: °·   tsp table salt. °· ¾ tsp baking soda. °·  tsp salt substitute containing potassium chloride. °· 1  tablespoons sugar. °· 1 L (34 oz) of water. °· Certain foods and beverages may increase the   speed at which food moves through the gastrointestinal (GI) tract. These foods and beverages should be avoided and include: °· Caffeinated and alcoholic beverages. °· High-fiber foods, such as raw fruits and vegetables, nuts, seeds, and whole grain breads and cereals. °· Foods and beverages sweetened with sugar alcohols, such as xylitol, sorbitol, and  mannitol. °· Some foods may be well tolerated and may help thicken stool including: °· Starchy foods, such as rice, toast, pasta, low-sugar cereal, oatmeal, grits, baked potatoes, crackers, and bagels. °· Bananas. °· Applesauce. °· Add probiotic-rich foods to help increase healthy bacteria in the GI tract, such as yogurt and fermented milk products. °· Wash your hands well after each diarrhea episode. °· Only take over-the-counter or prescription medicines as directed by your caregiver. °· Take a warm bath to relieve any burning or pain from frequent diarrhea episodes. °SEEK IMMEDIATE MEDICAL CARE IF:  °· You are unable to keep fluids down. °· You have persistent vomiting. °· You have blood in your stool, or your stools are black and tarry. °· You do not urinate in 6 8 hours, or there is only a small amount of very dark urine. °· You have abdominal pain that increases or localizes. °· You have weakness, dizziness, confusion, or lightheadedness. °· You have a severe headache. °· Your diarrhea gets worse or does not get better. °· You have a fever or persistent symptoms for more than 2 3 days. °· You have a fever and your symptoms suddenly get worse. °MAKE SURE YOU:  °· Understand these instructions. °· Will watch your condition. °· Will get help right away if you are not doing well or get worse. °Document Released: 12/19/2001 Document Revised: 12/16/2011 Document Reviewed: 09/06/2011 °Exit[employer]® Patient Information ©2014 [employer]. ° °Nausea and Vomiting °Nausea is a sick feeling that often comes before throwing up (vomiting). Vomiting is a reflex where stomach contents come out of your mouth. Vomiting can cause severe loss of body fluids (dehydration). Children and elderly adults can become dehydrated quickly, especially if they also have diarrhea. Nausea and vomiting are symptoms of a condition or disease. It is important to find the cause of your symptoms. °CAUSES  °· Direct irritation of the stomach lining.  This irritation can result from increased acid production (gastroesophageal reflux disease), infection, food poisoning, taking certain medicines (such as nonsteroidal anti-inflammatory drugs), alcohol use, or tobacco use. °· Signals from the brain. These signals could be caused by a headache, heat exposure, an inner ear disturbance, increased pressure in the brain from injury, infection, a tumor, or a concussion, pain, emotional stimulus, or metabolic problems. °· An obstruction in the gastrointestinal tract (bowel obstruction). °· Illnesses such as diabetes, hepatitis, gallbladder problems, appendicitis, kidney problems, cancer, sepsis, atypical symptoms of a heart attack, or eating disorders. °· Medical treatments such as chemotherapy and radiation. °· Receiving medicine that makes you sleep (general anesthetic) during surgery. °DIAGNOSIS °Your caregiver may ask for tests to be done if the problems do not improve after a few days. Tests may also be done if symptoms are severe or if the reason for the nausea and vomiting is not clear. Tests may include: °· Urine tests. °· Blood tests. °· Stool tests. °· Cultures (to look for evidence of infection). °· X-rays or other imaging studies. °Test results can help your caregiver make decisions about treatment or the need for additional tests. °TREATMENT °You need to stay well hydrated. Drink frequently but in small amounts. You may wish to drink water, sports drinks, clear broth,   or eat frozen ice pops or gelatin dessert to help stay hydrated. When you eat, eating slowly may help prevent nausea. There are also some antinausea medicines that may help prevent nausea. °HOME CARE INSTRUCTIONS  °· Take all medicine as directed by your caregiver. °· If you do not have an appetite, do not force yourself to eat. However, you must continue to drink fluids. °· If you have an appetite, eat a normal diet unless your caregiver tells you differently. °· Eat a variety of complex  carbohydrates (rice, wheat, potatoes, bread), lean meats, yogurt, fruits, and vegetables. °· Avoid high-fat foods because they are more difficult to digest. °· Drink enough water and fluids to keep your urine clear or pale yellow. °· If you are dehydrated, ask your caregiver for specific rehydration instructions. Signs of dehydration may include: °· Severe thirst. °· Dry lips and mouth. °· Dizziness. °· Dark urine. °· Decreasing urine frequency and amount. °· Confusion. °· Rapid breathing or pulse. °SEEK IMMEDIATE MEDICAL CARE IF:  °· You have blood or brown flecks (like coffee grounds) in your vomit. °· You have black or bloody stools. °· You have a severe headache or stiff neck. °· You are confused. °· You have severe abdominal pain. °· You have chest pain or trouble breathing. °· You do not urinate at least once every 8 hours. °· You develop cold or clammy skin. °· You continue to vomit for longer than 24 to 48 hours. °· You have a fever. °MAKE SURE YOU:  °· Understand these instructions. °· Will watch your condition. °· Will get help right away if you are not doing well or get worse. °Document Released: 12/29/2004 Document Revised: 03/23/2011 Document Reviewed: 05/28/2010 °[employer]® Patient Information ©2014 [employer]. ° °

## 2013-03-21 NOTE — ED Notes (Signed)
Pt c/o abd pain, n/v/d, chills that started a few days ago,

## 2013-03-21 NOTE — ED Provider Notes (Signed)
CSN: [id number]     Arrival date & time 03/21/13  1311 History  This chart was scribed for Gilda Creasehristopher J. Pollina, MD by Quintella ReichertMatthew Underwood, ED scribe.  This patient was seen in room [facility]14/[facility]14 and the patient's care was started at 3:22 PM.   Chief Complaint  Patient presents with  . Abdominal Pain    The history is provided by the patient. No language interpreter was used.    HPI Comments: [name] is a 43 y.o. female who presents to the Emergency Department complaining of constant moderate upper abdominal pain with associated nausea, vomiting and diarrhea that began 2 days ago.  Pt reports her daughter has recently acquired similar symptoms.   Past Medical History  Diagnosis Date  . Scoliosis   . Hypertension   . Panic attack   . Back pain   . Depression   . Anxiety   . Ulcer   . Chronic back pain 02/04/13    By patient report    Past Surgical History  Procedure Laterality Date  . Back surgery    . Cesarean section    . Neck surgery    . Tubal ligation    . Cholecystectomy  11/27/2011    Procedure: LAPAROSCOPIC CHOLECYSTECTOMY;  Surgeon: Fabio BeringBrent C Ziegler, MD;  Location: [facility];  Service: General;  Laterality: N/A;    Family History  Problem Relation Age of Onset  . Heart disease    . Arthritis    . Lung disease    . Cancer    . Asthma    . Diabetes      History  Substance Use Topics  . Smoking status: Current Every Day Smoker -- 2.00 packs/day for 25 years    Types: Cigarettes  . Smokeless tobacco: Never Used  . Alcohol Use: No   OB History   Grav Para Term Preterm Abortions TAB SAB Ect Mult Living   4 3 3  1     3        Review of Systems  Gastrointestinal: Positive for nausea, vomiting, abdominal pain and diarrhea.  All other systems reviewed and are negative.      Allergies  Penicillins; Ibuprofen; and Aspirin  Home Medications   Current Outpatient Rx  Name  Route  Sig  Dispense  Refill  . albuterol (PROVENTIL HFA;VENTOLIN HFA) 108  (90 BASE) MCG/ACT inhaler   Inhalation   Inhale 2 puffs into the lungs every 6 (six) hours as needed. For shortness of breath         . ALPRAZolam (XANAX) 1 MG tablet   Oral   Take 2 mg by mouth 3 (three) times daily.          . citalopram (CELEXA) 20 MG tablet   Oral   Take 20 mg by mouth daily.          Marland Kitchen. gabapentin (NEURONTIN) 300 MG capsule   Oral   Take 300 mg by mouth 3 (three) times daily.          Marland Kitchen. HYDROcodone-acetaminophen (NORCO/VICODIN) 5-325 MG per tablet   Oral   Take 1 tablet by mouth every 6 (six) hours as needed for moderate pain.         Marland Kitchen. lisinopril-hydrochlorothiazide (PRINZIDE,ZESTORETIC) 20-25 MG per tablet   Oral   Take 1 tablet by mouth daily.          . meloxicam (MOBIC) 15 MG tablet   Oral   Take 15 mg by mouth daily.          .Marland Kitchen  methocarbamol (ROBAXIN) 750 MG tablet   Oral   Take 750 mg by mouth 4 (four) times daily.           Marland Kitchen oxyCODONE-acetaminophen (PERCOCET) 5-325 MG per tablet   Oral   Take 2 tablets by mouth every 4 (four) hours as needed.   12 tablet   0   . pantoprazole (PROTONIX) 40 MG tablet   Oral   Take 40 mg by mouth 2 (two) times daily.          . potassium chloride (K-DUR) 10 MEQ tablet   Oral   Take 10 mEq by mouth daily.         . promethazine (PHENERGAN) 25 MG tablet   Oral   Take 1 tablet (25 mg total) by mouth every 6 (six) hours as needed for nausea or vomiting.   6 tablet   0   . sucralfate (CARAFATE) 1 G tablet   Oral   Take 1 tablet (1 g total) by mouth 4 (four) times daily -  with meals and at bedtime.   120 tablet   0   . sucralfate (CARAFATE) 1 GM/10ML suspension   Oral   Take 10 mLs (1 g total) by mouth 4 (four) times daily -  with meals and at bedtime.   420 mL   0   . temazepam (RESTORIL) 30 MG capsule   Oral   Take 60 mg by mouth at bedtime. For sleep          BP 108/69  Pulse 109  Temp(Src) 97.6 F (36.4 C) (Oral)  Resp 20  Ht 5\' 2"  (1.575 m)  Wt 190 lb (86.183  kg)  BMI 34.74 kg/m2  SpO2 99%  LMP 02/21/2013  Physical Exam  Nursing note and vitals reviewed. Constitutional: She is oriented to person, place, and time. She appears well-developed and well-nourished. No distress.  HENT:  Head: Normocephalic and atraumatic.  Right Ear: Hearing normal.  Left Ear: Hearing normal.  Nose: Nose normal.  Mouth/Throat: Oropharynx is clear and moist and mucous membranes are normal.  Eyes: Conjunctivae and EOM are normal. Pupils are equal, round, and reactive to light.  Neck: Normal range of motion. Neck supple.  Cardiovascular: Regular rhythm, S1 normal and S2 normal.  Exam reveals no gallop and no friction rub.   No murmur heard. Pulmonary/Chest: Effort normal and breath sounds normal. No respiratory distress. She exhibits no tenderness.  Abdominal: Soft. Normal appearance and bowel sounds are normal. There is no hepatosplenomegaly. There is tenderness (epigastric). There is no rebound, no guarding, no tenderness at McBurney's point and negative Murphy's sign. No hernia.  Musculoskeletal: Normal range of motion.  Neurological: She is alert and oriented to person, place, and time. She has normal strength. No cranial nerve deficit or sensory deficit. Coordination normal. GCS eye subscore is 4. GCS verbal subscore is 5. GCS motor subscore is 6.  Skin: Skin is warm, dry and intact. No rash noted. No cyanosis.  Psychiatric: She has a normal mood and affect. Her speech is normal and behavior is normal. Thought content normal.    ED Course  Procedures (including critical care time)  DIAGNOSTIC STUDIES: Oxygen Saturation is 99% on room air, normal by my interpretation.    COORDINATION OF CARE: 3:28 PM-Discussed treatment plan which includes labs with pt at bedside and pt agreed to plan.     Labs Review Labs Reviewed  CBC WITH DIFFERENTIAL - Abnormal; Notable for the following:    WBC  15.8 (*)    RBC 5.42 (*)    Hemoglobin 18.4 (*)    HCT 50.6 (*)     MCHC 36.4 (*)    Neutro Abs 11.5 (*)    Monocytes Absolute 1.3 (*)    All other components within normal limits  COMPREHENSIVE METABOLIC PANEL - Abnormal; Notable for the following:    Glucose, Bld 145 (*)    Creatinine, Ser 1.40 (*)    Total Protein 8.6 (*)    Alkaline Phosphatase 228 (*)    GFR calc non Af Amer 46 (*)    GFR calc Af Amer 53 (*)    All other components within normal limits  LIPASE, BLOOD - Abnormal; Notable for the following:    Lipase 69 (*)    All other components within normal limits  URINALYSIS, ROUTINE W REFLEX MICROSCOPIC   Imaging Review No results found.   EKG Interpretation None      MDM   Final diagnoses:  None   Patient presents to the ER for nausea, vomiting, diarrhea, chills. She has been experiencing upper abdominal cramping. Symptoms began several days ago. She has a close family members had similar symptoms. Patient also has recently been told she had a stomach problem and was treated with Carafate. She has run out of this, has been having increasing reflux type symptoms and she became ill.  Patient has had previous cholecystectomy. Lab work was unremarkable. Abdominal exam revealed mild diffuse tenderness, no concern for acute surgical process. No advanced imaging necessary. Patient treated with fluids, antiemetics, pain medication. She is feeling better, tolerating by mouth. He'll be discharged to continue symptomatic treatment.    I personally performed the services described in this documentation, which was scribed in my presence. The recorded information has been reviewed and is accurate.    [name], MD 03/21/13 [phone]

## 2013-04-07 ENCOUNTER — Emergency Department (HOSPITAL_COMMUNITY): Payer: Medicaid Other

## 2013-04-07 ENCOUNTER — Encounter (HOSPITAL_COMMUNITY): Payer: Self-pay | Admitting: Emergency Medicine

## 2013-04-07 ENCOUNTER — Emergency Department (HOSPITAL_COMMUNITY)
Admission: EM | Admit: 2013-04-07 | Discharge: 2013-04-07 | Disposition: A | Discharge status: Home / Self Care | Payer: Medicaid Other | Attending: Emergency Medicine | Admitting: Emergency Medicine

## 2013-04-07 DIAGNOSIS — F17200 Nicotine dependence, unspecified, uncomplicated: Secondary | ICD-10-CM | POA: Insufficient documentation

## 2013-04-07 DIAGNOSIS — Y92009 Unspecified place in unspecified non-institutional (private) residence as the place of occurrence of the external cause: Secondary | ICD-10-CM | POA: Insufficient documentation

## 2013-04-07 DIAGNOSIS — W010XXA Fall on same level from slipping, tripping and stumbling without subsequent striking against object, initial encounter: Secondary | ICD-10-CM | POA: Insufficient documentation

## 2013-04-07 DIAGNOSIS — IMO0002 Reserved for concepts with insufficient information to code with codable children: Secondary | ICD-10-CM | POA: Insufficient documentation

## 2013-04-07 DIAGNOSIS — Z79899 Other long term (current) drug therapy: Secondary | ICD-10-CM | POA: Insufficient documentation

## 2013-04-07 DIAGNOSIS — Z9889 Other specified postprocedural states: Secondary | ICD-10-CM | POA: Insufficient documentation

## 2013-04-07 DIAGNOSIS — Y939 Activity, unspecified: Secondary | ICD-10-CM | POA: Insufficient documentation

## 2013-04-07 DIAGNOSIS — F3289 Other specified depressive episodes: Secondary | ICD-10-CM | POA: Insufficient documentation

## 2013-04-07 DIAGNOSIS — W19XXXA Unspecified fall, initial encounter: Secondary | ICD-10-CM

## 2013-04-07 DIAGNOSIS — F410 Panic disorder [episodic paroxysmal anxiety] without agoraphobia: Secondary | ICD-10-CM | POA: Insufficient documentation

## 2013-04-07 DIAGNOSIS — G8929 Other chronic pain: Secondary | ICD-10-CM | POA: Insufficient documentation

## 2013-04-07 DIAGNOSIS — F329 Major depressive disorder, single episode, unspecified: Secondary | ICD-10-CM | POA: Insufficient documentation

## 2013-04-07 DIAGNOSIS — I10 Essential (primary) hypertension: Secondary | ICD-10-CM | POA: Insufficient documentation

## 2013-04-07 DIAGNOSIS — Z880 Allergy status to penicillin: Secondary | ICD-10-CM | POA: Insufficient documentation

## 2013-04-07 DIAGNOSIS — M5416 Radiculopathy, lumbar region: Secondary | ICD-10-CM

## 2013-04-07 DIAGNOSIS — Z791 Long term (current) use of non-steroidal anti-inflammatories (NSAID): Secondary | ICD-10-CM | POA: Insufficient documentation

## 2013-04-07 DIAGNOSIS — Z872 Personal history of diseases of the skin and subcutaneous tissue: Secondary | ICD-10-CM | POA: Insufficient documentation

## 2013-04-07 MED ORDER — OXYCODONE-ACETAMINOPHEN 5-325 MG PO TABS: 1.0000 | ORAL_TABLET | ORAL | Status: DC | PRN

## 2013-04-07 MED ORDER — HYDROMORPHONE HCL PF 1 MG/ML IJ SOLN
1.0000 mg | Freq: Once | INTRAMUSCULAR | Status: AC
  Administered 2013-04-07: 1 mg via INTRAMUSCULAR
  Filled 2013-04-07: qty 1

## 2013-04-07 NOTE — ED Notes (Signed)
nad noted prior to dc. Dc instructions reviewed and explained. Pt voiced understanding. Ambulated out without difficulty.  

## 2013-04-07 NOTE — ED Notes (Signed)
Fell in bathtub today 1230p.   Pain in back and legs.  No LOC. Alert, No HI.

## 2013-04-07 NOTE — Discharge Instructions (Signed)
Lumbosacral Radiculopathy Lumbosacral radiculopathy is a pinched nerve or nerves in the low back (lumbosacral area). When this happens you may have weakness in your legs and may not be able to stand on your toes. You may have pain going down into your legs. There may be difficulties with walking normally. There are many causes of this problem. Sometimes this may happen from an injury, or simply from arthritis or boney problems. It may also be caused by other illnesses such as diabetes. If there is no improvement after treatment, further studies may be done to find the exact cause. DIAGNOSIS  X-rays may be needed if the problems become long standing. Electromyograms may be done. This study is one in which the working of nerves and muscles is studied. HOME CARE INSTRUCTIONS   Applications of ice packs may be helpful. Ice can be used in a plastic bag with a towel around it to prevent frostbite to skin. This may be used every 2 hours for 20 to 30 minutes, or as needed, while awake, or as directed by your caregiver.  Only take over-the-counter or prescription medicines for pain, discomfort, or fever as directed by your caregiver.  If physical therapy was prescribed, follow your caregiver's directions. SEEK IMMEDIATE MEDICAL CARE IF:   You have pain not controlled with medications.  You seem to be getting worse rather than better.  You develop increasing weakness in your legs.  You develop loss of bowel or bladder control.  You have difficulty with walking or balance, or develop clumsiness in the use of your legs.  You have a fever. MAKE SURE YOU:   Understand these instructions.  Will watch your condition.  Will get help right away if you are not doing well or get worse. Document Released: 12/29/2004 Document Revised: 03/23/2011 Document Reviewed: 08/19/2007 [facility] Patient Information 2014 [place], [place].   Use your home medicines including your meloxicam, your hydrocodone (may need  to increase to 1 tablet from the 1/2 tablet you are currently taking.   Avoid lifting,  Bending,  Twisting or any other activity that worsens your pain over the next week.  Apply an  icepack  to your lower back for 10-15 minutes every 2 hours for the next 2 days. You may then add a heating pad starting on Monday,  20 minutes 3-4 times daily.   You should get rechecked if your symptoms are not better over the next 5 days,  Or you develop increased pain,  Weakness in your leg(s) or loss of bladder or bowel function - these are symptoms of a worse injury.  Your xrays are negative tonight for any acute injury from your fall.

## 2013-04-07 NOTE — ED Provider Notes (Signed)
CSN: [id number]     Arrival date & time 04/07/13  2034 History   First MD Initiated Contact with Patient 04/07/13 2111     Chief Complaint  Patient presents with  . Fall     (Consider location/radiation/quality/duration/timing/severity/associated sxs/prior Treatment) HPI Comments: [name] is a 43 y.o. Female who slipped and fell in the shower today around 12:30 pm,  Landing on her right side and buttock area.  She has chronic low back pain pain with radiculopathy (currently followed by her pcp, but waiting to establish care with a pain specialist) which is worsened since todays fall.  She describes radiation of pain into her right toes,  Typically her pain radiates to her mid calf at baseline.  She denies weakness or numbness in her legs and has had no urinary or bowel problems since todays fall.  She takes hydrocodone, robaxin and mobic prn back pain prn     The history is provided by the patient.    Past Medical History  Diagnosis Date  . Scoliosis   . Hypertension   . Panic attack   . Back pain   . Depression   . Anxiety   . Ulcer   . Chronic back pain 02/04/13    By patient report   Past Surgical History  Procedure Laterality Date  . Back surgery    . Cesarean section    . Neck surgery    . Tubal ligation    . Cholecystectomy  11/27/2011    Procedure: LAPAROSCOPIC CHOLECYSTECTOMY;  Surgeon: Fabio Bering, MD;  Location: [facility];  Service: General;  Laterality: N/A;   Family History  Problem Relation Age of Onset  . Heart disease    . Arthritis    . Lung disease    . Cancer    . Asthma    . Diabetes     History  Substance Use Topics  . Smoking status: Current Every Day Smoker -- 2.00 packs/day for 25 years    Types: Cigarettes  . Smokeless tobacco: Never Used  . Alcohol Use: No   OB History   Grav Para Term Preterm Abortions TAB SAB Ect Mult Living   4 3 3  1     3      Review of Systems  Constitutional: Negative for fever.  Respiratory:  Negative for shortness of breath.   Cardiovascular: Negative for chest pain and leg swelling.  Gastrointestinal: Negative for abdominal pain, constipation and abdominal distention.  Genitourinary: Negative for dysuria, urgency, frequency, flank pain and difficulty urinating.  Musculoskeletal: Positive for back pain. Negative for gait problem and joint swelling.  Skin: Negative for rash.  Neurological: Negative for weakness and numbness.      Allergies  Penicillins; Ibuprofen; and Aspirin  Home Medications   Current Outpatient Rx  Name  Route  Sig  Dispense  Refill  . albuterol (PROVENTIL HFA;VENTOLIN HFA) 108 (90 BASE) MCG/ACT inhaler   Inhalation   Inhale 2 puffs into the lungs every 6 (six) hours as needed. For shortness of breath         . ALPRAZolam (XANAX) 1 MG tablet   Oral   Take 2 mg by mouth 3 (three) times daily.          Marland Kitchen bismuth subsalicylate (PEPTO BISMOL) 262 MG/15ML suspension   Oral   Take 30 mLs by mouth every 6 (six) hours as needed.         . citalopram (CELEXA) 20 MG tablet  Oral   Take 20 mg by mouth daily.          . diphenoxylate-atropine (LOMOTIL) 2.5-0.025 MG per tablet   Oral   Take 2 tablets by mouth 4 (four) times daily as needed for diarrhea or loose stools.   30 tablet   0   . gabapentin (NEURONTIN) 300 MG capsule   Oral   Take 300 mg by mouth 3 (three) times daily.          Marland Kitchen. HYDROcodone-acetaminophen (NORCO) 7.5-325 MG per tablet   Oral   Take 0.5 tablets by mouth 3 (three) times daily as needed for moderate pain.         Marland Kitchen. lisinopril-hydrochlorothiazide (PRINZIDE,ZESTORETIC) 20-25 MG per tablet   Oral   Take 1 tablet by mouth daily.          . meloxicam (MOBIC) 15 MG tablet   Oral   Take 15 mg by mouth daily.          . methocarbamol (ROBAXIN) 750 MG tablet   Oral   Take 750 mg by mouth 4 (four) times daily.           . pantoprazole (PROTONIX) 40 MG tablet   Oral   Take 40 mg by mouth 2 (two) times  daily.          . potassium chloride (K-DUR) 10 MEQ tablet   Oral   Take 10 mEq by mouth daily.         . promethazine (PHENERGAN) 25 MG tablet   Oral   Take 1 tablet (25 mg total) by mouth every 6 (six) hours as needed for nausea or vomiting.   30 tablet   0   . sucralfate (CARAFATE) 1 G tablet   Oral   Take 1 g by mouth 2 (two) times daily.         . temazepam (RESTORIL) 30 MG capsule   Oral   Take 60 mg by mouth at bedtime. For sleep          BP 112/48  Pulse 92  Temp(Src) 97.9 F (36.6 C) (Oral)  Resp 20  Ht 5\' 2"  (1.575 m)  Wt 180 lb (81.647 kg)  BMI 32.91 kg/m2  SpO2 100%  LMP 03/23/2013 Physical Exam  Nursing note and vitals reviewed. Constitutional: She appears well-developed and well-nourished.  HENT:  Head: Normocephalic.  Eyes: Conjunctivae are normal.  Neck: Normal range of motion. Neck supple.  Cardiovascular: Normal rate and intact distal pulses.   Pedal pulses normal.  Pulmonary/Chest: Effort normal.  Abdominal: Soft. Bowel sounds are normal. She exhibits no distension and no mass.  Musculoskeletal: Normal range of motion. She exhibits no edema.       Lumbar back: She exhibits bony tenderness. She exhibits no swelling, no edema and no spasm.  Neurological: She is alert. She has normal strength. She displays no atrophy and no tremor. No sensory deficit. Gait normal.  Reflex Scores:      Patellar reflexes are 2+ on the right side and 2+ on the left side.      Achilles reflexes are 2+ on the right side and 2+ on the left side. No strength deficit noted in hip and knee flexor and extensor muscle groups.  Ankle flexion and extension intact.  Skin: Skin is warm and dry.  Psychiatric: She has a normal mood and affect.    ED Course  Procedures (including critical care time) Labs Review Labs Reviewed - No data to display  Imaging Review Dg Lumbar Spine Complete  04/07/2013   CLINICAL DATA:  Fall onto right flank in shower. Low back pain  radiating down the right leg. Prior back surgeries.  EXAM: LUMBAR SPINE - COMPLETE 4+ VIEW  COMPARISON:  CT ABD - PELV W/ CM dated 02/21/2013; DG ABD ACUTE W/CHEST dated 02/21/2013; MR L SPINE WO/W CM dated 04/23/2011  FINDINGS: Pulse lateral rod and pedicle screw fixation at L4-5 noted with solid interbody fusion at L4-5 and L5-S1. Prior screw tracks at S1. Facetectomies at L5-S1. Mild sclerosis at the L5-S1 level.  No malalignment.  No acute fracture observed.  IMPRESSION: 1. Lower lumbar postoperative findings without complicating feature. No lumbar spine fracture or subluxation is radiographically apparent.   Electronically Signed   By: Herbie Baltimore M.D.   On: 04/07/2013 22:22     EKG Interpretation None      MDM   Final diagnoses:  Chronic lumbar radiculopathy  Fall at home    Patients labs and/or radiological studies were viewed and considered during the medical decision making and disposition process. No neuro deficit on exam or by history to suggest emergent or surgical presentation.  Also discussed worsened sx that should prompt immediate re-evaluation including distal weakness, bowel/bladder retention/incontinence.  Pt encouraged to take her robaxin and meloxicam (currently taking prn).  Will increase pain medicine to percocet for a few days.  Advised f/u with pcp for a recheck within 1 week.        [name], PA-C 04/07/13 2247  [name], PA-C 04/07/13 2250

## 2013-04-10 NOTE — ED Provider Notes (Signed)
Medical screening examination/treatment/procedure(s) were performed by non-physician practitioner and as supervising physician I was immediately available for consultation/collaboration.   EKG Interpretation None        Trevan Messman B. Abisai Coble, MD 04/10/13 1315 

## 2013-05-01 ENCOUNTER — Encounter (HOSPITAL_COMMUNITY): Payer: Self-pay | Admitting: Emergency Medicine

## 2013-05-01 ENCOUNTER — Emergency Department (HOSPITAL_COMMUNITY)
Admission: EM | Admit: 2013-05-01 | Discharge: 2013-05-01 | Disposition: A | Discharge status: Home / Self Care | Payer: Medicaid Other | Attending: Emergency Medicine | Admitting: Emergency Medicine

## 2013-05-01 DIAGNOSIS — Y929 Unspecified place or not applicable: Secondary | ICD-10-CM | POA: Insufficient documentation

## 2013-05-01 DIAGNOSIS — F3289 Other specified depressive episodes: Secondary | ICD-10-CM | POA: Insufficient documentation

## 2013-05-01 DIAGNOSIS — Z9889 Other specified postprocedural states: Secondary | ICD-10-CM | POA: Insufficient documentation

## 2013-05-01 DIAGNOSIS — Y9389 Activity, other specified: Secondary | ICD-10-CM | POA: Insufficient documentation

## 2013-05-01 DIAGNOSIS — S39012A Strain of muscle, fascia and tendon of lower back, initial encounter: Secondary | ICD-10-CM

## 2013-05-01 DIAGNOSIS — F329 Major depressive disorder, single episode, unspecified: Secondary | ICD-10-CM | POA: Insufficient documentation

## 2013-05-01 DIAGNOSIS — S335XXA Sprain of ligaments of lumbar spine, initial encounter: Secondary | ICD-10-CM | POA: Insufficient documentation

## 2013-05-01 DIAGNOSIS — Z880 Allergy status to penicillin: Secondary | ICD-10-CM | POA: Insufficient documentation

## 2013-05-01 DIAGNOSIS — S40019A Contusion of unspecified shoulder, initial encounter: Secondary | ICD-10-CM | POA: Insufficient documentation

## 2013-05-01 DIAGNOSIS — M4120 Other idiopathic scoliosis, site unspecified: Secondary | ICD-10-CM | POA: Insufficient documentation

## 2013-05-01 DIAGNOSIS — F411 Generalized anxiety disorder: Secondary | ICD-10-CM | POA: Insufficient documentation

## 2013-05-01 DIAGNOSIS — I10 Essential (primary) hypertension: Secondary | ICD-10-CM | POA: Insufficient documentation

## 2013-05-01 DIAGNOSIS — W010XXA Fall on same level from slipping, tripping and stumbling without subsequent striking against object, initial encounter: Secondary | ICD-10-CM | POA: Insufficient documentation

## 2013-05-01 DIAGNOSIS — F17200 Nicotine dependence, unspecified, uncomplicated: Secondary | ICD-10-CM | POA: Insufficient documentation

## 2013-05-01 DIAGNOSIS — G8929 Other chronic pain: Secondary | ICD-10-CM | POA: Insufficient documentation

## 2013-05-01 DIAGNOSIS — Z872 Personal history of diseases of the skin and subcutaneous tissue: Secondary | ICD-10-CM | POA: Insufficient documentation

## 2013-05-01 DIAGNOSIS — Z79899 Other long term (current) drug therapy: Secondary | ICD-10-CM | POA: Insufficient documentation

## 2013-05-01 MED ORDER — OXYCODONE-ACETAMINOPHEN 5-325 MG PO TABS: 1.0000 | ORAL_TABLET | ORAL | Status: DC | PRN

## 2013-05-01 MED ORDER — METHOCARBAMOL 500 MG PO TABS: 1000.0000 mg | ORAL_TABLET | Freq: Three times a day (TID) | ORAL | Status: DC

## 2013-05-01 NOTE — Discharge Instructions (Signed)
Contusion  A contusion is a deep bruise. Contusions happen when an injury causes bleeding under the skin. Signs of bruising include pain, puffiness (swelling), and discolored skin. The contusion may turn blue, purple, or yellow.  HOME CARE   · Put ice on the injured area.  · Put ice in a plastic bag.  · Place a towel between your skin and the bag.  · Leave the ice on for 15-20 minutes, 03-04 times a day.  · Only take medicine as told by your doctor.  · Rest the injured area.  · If possible, raise (elevate) the injured area to lessen puffiness.  GET HELP RIGHT AWAY IF:   · You have more bruising or puffiness.  · You have pain that is getting worse.  · Your puffiness or pain is not helped by medicine.  MAKE SURE YOU:   · Understand these instructions.  · Will watch your condition.  · Will get help right away if you are not doing well or get worse.  Document Released: 06/17/2007 Document Revised: 03/23/2011 Document Reviewed: 11/03/2010  [employer]® Patient Information ©2014 [employer].

## 2013-05-01 NOTE — ED Notes (Signed)
Slipped and fell on wet floor this am.  Back and neck pain

## 2013-05-04 NOTE — ED Provider Notes (Signed)
CSN: [id number]     Arrival date & time 05/01/13  2011 History   First MD Initiated Contact with Patient 05/01/13 2104     Chief Complaint  Patient presents with  . Fall     (Consider location/radiation/quality/duration/timing/severity/associated sxs/prior Treatment) Patient is a 43 y.o. female presenting with back pain. The history is provided by the patient.  Back Pain Location:  Lumbar spine Quality:  Aching Radiates to:  Does not radiate Pain severity:  Moderate Pain is:  Same all the time Onset quality:  Sudden Duration:  12 hours Timing:  Constant Progression:  Unchanged Chronicity: acute on chronic. Context: falling   Context comment:  Patient reports slipped and fell on wet floor , landed on her back.  has neck pain but denies striking her neck or head on anything.  also denies headaches, dizziness, vomiting or LOC Relieved by:  Nothing Worsened by:  Movement, twisting and bending Ineffective treatments:  NSAIDs and OTC medications Associated symptoms: no abdominal pain, no abdominal swelling, no bladder incontinence, no bowel incontinence, no chest pain, no dysuria, no fever, no headaches, no leg pain, no numbness, no paresthesias, no pelvic pain, no perianal numbness, no tingling and no weakness     Past Medical History  Diagnosis Date  . Scoliosis   . Hypertension   . Panic attack   . Back pain   . Depression   . Anxiety   . Ulcer   . Chronic back pain 02/04/13    By patient report   Past Surgical History  Procedure Laterality Date  . Back surgery    . Cesarean section    . Neck surgery    . Tubal ligation    . Cholecystectomy  11/27/2011    Procedure: LAPAROSCOPIC CHOLECYSTECTOMY;  Surgeon: Fabio Bering, MD;  Location: [facility];  Service: General;  Laterality: N/A;   Family History  Problem Relation Age of Onset  . Heart disease    . Arthritis    . Lung disease    . Cancer    . Asthma    . Diabetes     History  Substance Use Topics  . Smoking  status: Current Every Day Smoker -- 2.00 packs/day for 25 years    Types: Cigarettes  . Smokeless tobacco: Never Used  . Alcohol Use: No   OB History   Grav Para Term Preterm Abortions TAB SAB Ect Mult Living   4 3 3  1     3      Review of Systems  Constitutional: Negative for fever.  Respiratory: Negative for shortness of breath.   Cardiovascular: Negative for chest pain.  Gastrointestinal: Negative for vomiting, abdominal pain, constipation and bowel incontinence.  Genitourinary: Negative for bladder incontinence, dysuria, hematuria, flank pain, decreased urine volume, difficulty urinating and pelvic pain.       No perineal numbness or incontinence of urine or feces  Musculoskeletal: Positive for back pain and neck pain. Negative for gait problem, joint swelling and neck stiffness.  Skin: Negative for rash.  Neurological: Negative for dizziness, tingling, syncope, weakness, numbness, headaches and paresthesias.  All other systems reviewed and are negative.     Allergies  Penicillins; Ibuprofen; and Aspirin  Home Medications   Prior to Admission medications   Medication Sig Start Date End Date Taking? Authorizing Provider  albuterol (PROVENTIL HFA;VENTOLIN HFA) 108 (90 BASE) MCG/ACT inhaler Inhale 2 puffs into the lungs every 6 (six) hours as needed. For shortness of breath    Historical  Provider, MD  ALPRAZolam Prudy Feeler) 1 MG tablet Take 2 mg by mouth 3 (three) times daily.     Historical Provider, MD  bismuth subsalicylate (PEPTO BISMOL) 262 MG/15ML suspension Take 30 mLs by mouth every 6 (six) hours as needed.    Historical Provider, MD  citalopram (CELEXA) 20 MG tablet Take 20 mg by mouth daily.     Historical Provider, MD  diphenoxylate-atropine (LOMOTIL) 2.5-0.025 MG per tablet Take 2 tablets by mouth 4 (four) times daily as needed for diarrhea or loose stools. 03/21/13   [name], MD  gabapentin (NEURONTIN) 300 MG capsule Take 300 mg by mouth 3 (three) times  daily.     Historical Provider, MD  HYDROcodone-acetaminophen (NORCO) 7.5-325 MG per tablet Take 0.5 tablets by mouth 3 (three) times daily as needed for moderate pain.    Historical Provider, MD  lisinopril-hydrochlorothiazide (PRINZIDE,ZESTORETIC) 20-25 MG per tablet Take 1 tablet by mouth daily.     Historical Provider, MD  meloxicam (MOBIC) 15 MG tablet Take 15 mg by mouth daily.     Historical Provider, MD  methocarbamol (ROBAXIN) 500 MG tablet Take 2 tablets (1,000 mg total) by mouth 3 (three) times daily. 05/01/13   Mase Dhondt L. Drakkar Medeiros, PA-C  methocarbamol (ROBAXIN) 750 MG tablet Take 750 mg by mouth 4 (four) times daily.      Historical Provider, MD  oxyCODONE-acetaminophen (PERCOCET/ROXICET) 5-325 MG per tablet Take 1 tablet by mouth every 4 (four) hours as needed for severe pain. 04/07/13   [name], PA-C  oxyCODONE-acetaminophen (PERCOCET/ROXICET) 5-325 MG per tablet Take 1 tablet by mouth every 4 (four) hours as needed for severe pain. 05/01/13   Harnoor Kohles L. Addilyne Backs, PA-C  pantoprazole (PROTONIX) 40 MG tablet Take 40 mg by mouth 2 (two) times daily.     Historical Provider, MD  potassium chloride (K-DUR) 10 MEQ tablet Take 10 mEq by mouth daily.    Historical Provider, MD  promethazine (PHENERGAN) 25 MG tablet Take 1 tablet (25 mg total) by mouth every 6 (six) hours as needed for nausea or vomiting. 03/21/13   [name], MD  sucralfate (CARAFATE) 1 G tablet Take 1 g by mouth 2 (two) times daily. 02/21/13   Donnetta Hutching, MD  temazepam (RESTORIL) 30 MG capsule Take 60 mg by mouth at bedtime. For sleep    Historical Provider, MD   BP 97/58  Pulse 71  Temp(Src) 98.3 F (36.8 C) (Oral)  Resp 20  Ht 5\' 2"  (1.575 m)  Wt 180 lb (81.647 kg)  BMI 32.91 kg/m2  SpO2 100%  LMP 04/28/2013 Physical Exam  Nursing note and vitals reviewed. Constitutional: She is oriented to person, place, and time. She appears well-developed and well-nourished. No distress.  HENT:  Head: Normocephalic and  atraumatic.  Eyes: Conjunctivae and EOM are normal. Pupils are equal, round, and reactive to light.  Neck: Normal range of motion. Neck supple.  Cardiovascular: Normal rate, regular rhythm, normal heart sounds and intact distal pulses.   No murmur heard. Pulmonary/Chest: Effort normal and breath sounds normal. No respiratory distress. She exhibits no tenderness.  Abdominal: Soft. She exhibits no distension. There is no tenderness.  Musculoskeletal: She exhibits tenderness. She exhibits no edema.       Cervical back: She exhibits tenderness. She exhibits normal range of motion, no bony tenderness, no swelling, no edema, no spasm and normal pulse.       Lumbar back: She exhibits tenderness and pain. She exhibits normal range of motion, no swelling,  no deformity, no laceration and normal pulse.       Back:  ttp of the lumbar and cervical paraspinal muscles.  No spinal tenderness.  Tenderness of the left scapular border.  No bony deformities.  DP and radial pulses are brisk and symmetrical.  Distal sensation intact.  Hip Flexors/Extensors are intact.  Pt has normal strength against resistance of bilateral upper and lower extremities.     Neurological: She is alert and oriented to person, place, and time. She has normal strength. No sensory deficit. She exhibits normal muscle tone. Coordination and gait normal.  Reflex Scores:      Tricep reflexes are 2+ on the right side and 2+ on the left side.      Bicep reflexes are 2+ on the right side and 2+ on the left side.      Patellar reflexes are 2+ on the right side and 2+ on the left side.      Achilles reflexes are 2+ on the right side and 2+ on the left side. Skin: Skin is warm and dry. No rash noted.    ED Course  Procedures (including critical care time) Labs Review Labs Reviewed - No data to display  Imaging Review No results found.   EKG Interpretation None      MDM   Final diagnoses:  Shoulder contusion  Lumbar strain     Patient is well appearing.  NAD.  Moves all extremities w/o difficulty.  No focal neuro deficits or concerning sx's for emergent neurological or infectious process. No indication for imaging at this time.   Pt agrees to sx treatment and f/u with her PMD for recheck if needed.  She is ambulatory with steady gait and stable for d/c    Dailon Sheeran L. Trisha Mangleriplett, PA-C 05/04/13 2204

## 2013-05-05 NOTE — ED Provider Notes (Signed)
Medical screening examination/treatment/procedure(s) were performed by non-physician practitioner and as supervising physician I was immediately available for consultation/collaboration.   EKG Interpretation None      Jearldean Gutt, MD, FACEP   Abdulmalik Darco L Cadence Haslam, MD 05/05/13 1508 

## 2013-05-09 ENCOUNTER — Emergency Department (HOSPITAL_COMMUNITY): Payer: Medicaid Other

## 2013-05-09 ENCOUNTER — Encounter (HOSPITAL_COMMUNITY): Payer: Self-pay | Admitting: Emergency Medicine

## 2013-05-09 ENCOUNTER — Emergency Department (HOSPITAL_COMMUNITY)
Admission: EM | Admit: 2013-05-09 | Discharge: 2013-05-09 | Disposition: A | Discharge status: Home / Self Care | Payer: Medicaid Other | Attending: Emergency Medicine | Admitting: Emergency Medicine

## 2013-05-09 DIAGNOSIS — G8929 Other chronic pain: Secondary | ICD-10-CM | POA: Insufficient documentation

## 2013-05-09 DIAGNOSIS — F17200 Nicotine dependence, unspecified, uncomplicated: Secondary | ICD-10-CM | POA: Insufficient documentation

## 2013-05-09 DIAGNOSIS — Y92009 Unspecified place in unspecified non-institutional (private) residence as the place of occurrence of the external cause: Secondary | ICD-10-CM | POA: Insufficient documentation

## 2013-05-09 DIAGNOSIS — Z9889 Other specified postprocedural states: Secondary | ICD-10-CM | POA: Insufficient documentation

## 2013-05-09 DIAGNOSIS — M4120 Other idiopathic scoliosis, site unspecified: Secondary | ICD-10-CM | POA: Insufficient documentation

## 2013-05-09 DIAGNOSIS — F411 Generalized anxiety disorder: Secondary | ICD-10-CM | POA: Insufficient documentation

## 2013-05-09 DIAGNOSIS — S161XXA Strain of muscle, fascia and tendon at neck level, initial encounter: Secondary | ICD-10-CM

## 2013-05-09 DIAGNOSIS — F329 Major depressive disorder, single episode, unspecified: Secondary | ICD-10-CM | POA: Insufficient documentation

## 2013-05-09 DIAGNOSIS — Z880 Allergy status to penicillin: Secondary | ICD-10-CM | POA: Insufficient documentation

## 2013-05-09 DIAGNOSIS — I10 Essential (primary) hypertension: Secondary | ICD-10-CM | POA: Insufficient documentation

## 2013-05-09 DIAGNOSIS — Z872 Personal history of diseases of the skin and subcutaneous tissue: Secondary | ICD-10-CM | POA: Insufficient documentation

## 2013-05-09 DIAGNOSIS — Z79899 Other long term (current) drug therapy: Secondary | ICD-10-CM | POA: Insufficient documentation

## 2013-05-09 DIAGNOSIS — S139XXA Sprain of joints and ligaments of unspecified parts of neck, initial encounter: Secondary | ICD-10-CM | POA: Insufficient documentation

## 2013-05-09 DIAGNOSIS — F3289 Other specified depressive episodes: Secondary | ICD-10-CM | POA: Insufficient documentation

## 2013-05-09 DIAGNOSIS — Y9389 Activity, other specified: Secondary | ICD-10-CM | POA: Insufficient documentation

## 2013-05-09 DIAGNOSIS — W010XXA Fall on same level from slipping, tripping and stumbling without subsequent striking against object, initial encounter: Secondary | ICD-10-CM | POA: Insufficient documentation

## 2013-05-09 MED ORDER — HYDROMORPHONE HCL PF 1 MG/ML IJ SOLN
1.0000 mg | Freq: Once | INTRAMUSCULAR | Status: AC
  Administered 2013-05-09: 1 mg via INTRAMUSCULAR
  Filled 2013-05-09: qty 1

## 2013-05-09 MED ORDER — OXYCODONE-ACETAMINOPHEN 5-325 MG PO TABS: 1.0000 | ORAL_TABLET | Freq: Four times a day (QID) | ORAL | Status: DC | PRN

## 2013-05-09 NOTE — ED Notes (Signed)
Pt stated she fell on kitchen floor that was wet after husband mopped last week and then yesterday , husband fell and pt attempted to help him up. Now presents with l. Sided neck/ shoulder/ upper back pain.

## 2013-05-09 NOTE — ED Provider Notes (Signed)
Medical screening examination/treatment/procedure(s) were performed by non-physician practitioner and as supervising physician I was immediately available for consultation/collaboration.    Amoria Mclees R Hazim Treadway, MD 05/09/13 2315 
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
## 2013-05-09 NOTE — ED Notes (Signed)
Pain low back and neck since last night,

## 2013-05-09 NOTE — Discharge Instructions (Signed)
Cervical Sprain °A cervical sprain is an injury in the neck in which the strong, fibrous tissues (ligaments) that connect your neck bones stretch or tear. Cervical sprains can range from mild to severe. Severe cervical sprains can cause the neck vertebrae to be unstable. This can lead to damage of the spinal cord and can result in serious nervous system problems. The amount of time it takes for a cervical sprain to get better depends on the cause and extent of the injury. Most cervical sprains heal in 1 to 3 weeks. °CAUSES  °Severe cervical sprains may be caused by:  °· Contact sport injuries (such as from football, rugby, wrestling, hockey, auto racing, gymnastics, diving, martial arts, or boxing).   °· Motor vehicle collisions.   °· Whiplash injuries. This is an injury from a sudden forward-and backward whipping movement of the head and neck.  °· Falls.   °Mild cervical sprains may be caused by:  °· Being in an awkward position, such as while cradling a telephone between your ear and shoulder.   °· Sitting in a chair that does not offer proper support.   °· Working at a poorly designed computer station.   °· Looking up or down for long periods of time.   °SYMPTOMS  °· Pain, soreness, stiffness, or a burning sensation in the front, back, or sides of the neck. This discomfort may develop immediately after the injury or slowly, 24 hours or more after the injury.   °· Pain or tenderness directly in the middle of the back of the neck.   °· Shoulder or upper back pain.   °· Limited ability to move the neck.   °· Headache.   °· Dizziness.   °· Weakness, numbness, or tingling in the hands or arms.   °· Muscle spasms.   °· Difficulty swallowing or chewing.   °· Tenderness and swelling of the neck.   °DIAGNOSIS  °Most of the time your health care provider can diagnose a cervical sprain by taking your history and doing a physical exam. Your health care provider will ask about previous neck injuries and any known neck  problems, such as arthritis in the neck. X-rays may be taken to find out if there are any other problems, such as with the bones of the neck. Other tests, such as a CT scan or MRI, may also be needed.  °TREATMENT  °Treatment depends on the severity of the cervical sprain. Mild sprains can be treated with rest, keeping the neck in place (immobilization), and pain medicines. Severe cervical sprains are immediately immobilized. Further treatment is done to help with pain, muscle spasms, and other symptoms and may include: °· Medicines, such as pain relievers, numbing medicines, or muscle relaxants.   °· Physical therapy. This may involve stretching exercises, strengthening exercises, and posture training. Exercises and improved posture can help stabilize the neck, strengthen muscles, and help stop symptoms from returning.   °HOME CARE INSTRUCTIONS  °· Put ice on the injured area.   °· Put ice in a plastic bag.   °· Place a towel between your skin and the bag.   °· Leave the ice on for 15 20 minutes, 3 4 times a day.   °· If your injury was severe, you may have been given a cervical collar to wear. A cervical collar is a two-piece collar designed to keep your neck from moving while it heals. °· Do not remove the collar unless instructed by your health care provider. °· If you have long hair, keep it outside of the collar. °· Ask your health care provider before making any adjustments to your collar.   Minor adjustments may be required over time to improve comfort and reduce pressure on your chin or on the back of your head.  Ifyou are allowed to remove the collar for cleaning or bathing, follow your health care provider's instructions on how to do so safely.  Keep your collar clean by wiping it with mild soap and water and drying it completely. If the collar you have been given includes removable pads, remove them every 1 2 days and hand wash them with soap and water. Allow them to air dry. They should be completely  dry before you wear them in the collar.  If you are allowed to remove the collar for cleaning and bathing, wash and dry the skin of your neck. Check your skin for irritation or sores. If you see any, tell your health care provider.  Do not drive while wearing the collar.   Only take over-the-counter or prescription medicines for pain, discomfort, or fever as directed by your health care provider.   Keep all follow-up appointments as directed by your health care provider.   Keep all physical therapy appointments as directed by your health care provider.   Make any needed adjustments to your workstation to promote good posture.   Avoid positions and activities that make your symptoms worse.   Warm up and stretch before being active to help prevent problems.  SEEK MEDICAL CARE IF:   Your pain is not controlled with medicine.   You are unable to decrease your pain medicine over time as planned.   Your activity level is not improving as expected.  SEEK IMMEDIATE MEDICAL CARE IF:   You develop any bleeding.  You develop stomach upset.  You have signs of an allergic reaction to your medicine.   Your symptoms get worse.   You develop new, unexplained symptoms.   You have numbness, tingling, weakness, or paralysis in any part of your body.  MAKE SURE YOU:   Understand these instructions.  Will watch your condition.  Will get help right away if you are not doing well or get worse. Document Released: 10/26/2006 Document Revised: 10/19/2012 Document Reviewed: 07/06/2012 [facility] Patient Information 2014 [place], [place].   Followup with your doctor as needed for persistent symptoms. We cannot continue to treat your chronic pain  - this treatment should come from your primary doctor and/or your chronic pain specialist.

## 2013-06-01 ENCOUNTER — Other Ambulatory Visit (HOSPITAL_COMMUNITY): Payer: Self-pay | Admitting: Anesthesiology

## 2013-06-01 DIAGNOSIS — M961 Postlaminectomy syndrome, not elsewhere classified: Secondary | ICD-10-CM

## 2013-06-08 ENCOUNTER — Ambulatory Visit (HOSPITAL_COMMUNITY): Payer: Medicaid Other

## 2013-06-12 ENCOUNTER — Ambulatory Visit (HOSPITAL_COMMUNITY): Payer: Medicaid Other

## 2013-06-16 ENCOUNTER — Ambulatory Visit (HOSPITAL_COMMUNITY)
Admission: RE | Admit: 2013-06-16 | Discharge: 2013-06-16 | Disposition: A | Discharge status: Home / Self Care | Payer: Medicaid Other | Source: Ambulatory Visit | Attending: Anesthesiology | Admitting: Anesthesiology

## 2013-06-16 ENCOUNTER — Other Ambulatory Visit (HOSPITAL_COMMUNITY): Payer: Self-pay | Admitting: Anesthesiology

## 2013-06-16 DIAGNOSIS — R209 Unspecified disturbances of skin sensation: Secondary | ICD-10-CM | POA: Diagnosis not present

## 2013-06-16 DIAGNOSIS — Z981 Arthrodesis status: Secondary | ICD-10-CM | POA: Insufficient documentation

## 2013-06-16 DIAGNOSIS — M545 Low back pain, unspecified: Secondary | ICD-10-CM | POA: Diagnosis not present

## 2013-06-16 DIAGNOSIS — M542 Cervicalgia: Secondary | ICD-10-CM | POA: Insufficient documentation

## 2013-06-16 DIAGNOSIS — M961 Postlaminectomy syndrome, not elsewhere classified: Secondary | ICD-10-CM

## 2013-06-16 DIAGNOSIS — M5030 Other cervical disc degeneration, unspecified cervical region: Secondary | ICD-10-CM | POA: Insufficient documentation

## 2013-06-16 LAB — POCT I-STAT CREATININE: Creatinine, Ser: 1.7 mg/dL — ABNORMAL HIGH (ref 0.50–1.10)

## 2013-06-22 ENCOUNTER — Ambulatory Visit (INDEPENDENT_AMBULATORY_CARE_PROVIDER_SITE_OTHER): Payer: Medicaid Other | Admitting: Otolaryngology

## 2013-07-09 ENCOUNTER — Encounter (HOSPITAL_COMMUNITY): Payer: Self-pay | Admitting: Emergency Medicine

## 2013-07-09 ENCOUNTER — Emergency Department (HOSPITAL_COMMUNITY)
Admission: EM | Admit: 2013-07-09 | Discharge: 2013-07-09 | Disposition: A | Discharge status: Home / Self Care | Payer: Medicaid Other | Attending: Emergency Medicine | Admitting: Emergency Medicine

## 2013-07-09 DIAGNOSIS — Z8739 Personal history of other diseases of the musculoskeletal system and connective tissue: Secondary | ICD-10-CM | POA: Insufficient documentation

## 2013-07-09 DIAGNOSIS — F329 Major depressive disorder, single episode, unspecified: Secondary | ICD-10-CM | POA: Insufficient documentation

## 2013-07-09 DIAGNOSIS — I10 Essential (primary) hypertension: Secondary | ICD-10-CM | POA: Insufficient documentation

## 2013-07-09 DIAGNOSIS — R141 Gas pain: Secondary | ICD-10-CM | POA: Insufficient documentation

## 2013-07-09 DIAGNOSIS — Z9851 Tubal ligation status: Secondary | ICD-10-CM | POA: Insufficient documentation

## 2013-07-09 DIAGNOSIS — Z79899 Other long term (current) drug therapy: Secondary | ICD-10-CM | POA: Insufficient documentation

## 2013-07-09 DIAGNOSIS — Z791 Long term (current) use of non-steroidal anti-inflammatories (NSAID): Secondary | ICD-10-CM | POA: Insufficient documentation

## 2013-07-09 DIAGNOSIS — R142 Eructation: Secondary | ICD-10-CM

## 2013-07-09 DIAGNOSIS — Z880 Allergy status to penicillin: Secondary | ICD-10-CM | POA: Insufficient documentation

## 2013-07-09 DIAGNOSIS — Z9089 Acquired absence of other organs: Secondary | ICD-10-CM | POA: Insufficient documentation

## 2013-07-09 DIAGNOSIS — R112 Nausea with vomiting, unspecified: Secondary | ICD-10-CM | POA: Insufficient documentation

## 2013-07-09 DIAGNOSIS — F410 Panic disorder [episodic paroxysmal anxiety] without agoraphobia: Secondary | ICD-10-CM | POA: Insufficient documentation

## 2013-07-09 DIAGNOSIS — R52 Pain, unspecified: Secondary | ICD-10-CM | POA: Insufficient documentation

## 2013-07-09 DIAGNOSIS — F3289 Other specified depressive episodes: Secondary | ICD-10-CM | POA: Insufficient documentation

## 2013-07-09 DIAGNOSIS — Z872 Personal history of diseases of the skin and subcutaneous tissue: Secondary | ICD-10-CM | POA: Insufficient documentation

## 2013-07-09 DIAGNOSIS — G8929 Other chronic pain: Secondary | ICD-10-CM | POA: Insufficient documentation

## 2013-07-09 DIAGNOSIS — R109 Unspecified abdominal pain: Secondary | ICD-10-CM | POA: Insufficient documentation

## 2013-07-09 DIAGNOSIS — R1010 Upper abdominal pain, unspecified: Secondary | ICD-10-CM

## 2013-07-09 DIAGNOSIS — F17200 Nicotine dependence, unspecified, uncomplicated: Secondary | ICD-10-CM | POA: Insufficient documentation

## 2013-07-09 DIAGNOSIS — R143 Flatulence: Secondary | ICD-10-CM

## 2013-07-09 LAB — COMPREHENSIVE METABOLIC PANEL
ALT: 84 U/L — ABNORMAL HIGH (ref 0–35)
AST: 68 U/L — ABNORMAL HIGH (ref 0–37)
Albumin: 3.7 g/dL (ref 3.5–5.2)
Alkaline Phosphatase: 271 U/L — ABNORMAL HIGH (ref 39–117)
BUN: 24 mg/dL — AB (ref 6–23)
CALCIUM: 9.2 mg/dL (ref 8.4–10.5)
CO2: 27 mEq/L (ref 19–32)
CREATININE: 1.29 mg/dL — AB (ref 0.50–1.10)
Chloride: 102 mEq/L (ref 96–112)
GFR calc Af Amer: 58 mL/min — ABNORMAL LOW (ref >90)
GFR calc non Af Amer: 50 mL/min — ABNORMAL LOW (ref >90)
Glucose, Bld: 111 mg/dL — ABNORMAL HIGH (ref 70–99)
Potassium: 3.9 mEq/L (ref 3.7–5.3)
Sodium: 140 mEq/L (ref 137–147)
TOTAL PROTEIN: 7.3 g/dL (ref 6.0–8.3)
Total Bilirubin: 0.5 mg/dL (ref 0.3–1.2)

## 2013-07-09 LAB — LIPASE, BLOOD: LIPASE: 44 U/L (ref 11–59)

## 2013-07-09 MED ORDER — SODIUM CHLORIDE 0.9 % IV BOLUS (SEPSIS)
500.0000 mL | Freq: Once | INTRAVENOUS | Status: AC
  Administered 2013-07-09: 01:00:00 via INTRAVENOUS

## 2013-07-09 MED ORDER — PROMETHAZINE HCL 25 MG PO TABS: 25.0000 mg | ORAL_TABLET | Freq: Four times a day (QID) | ORAL | Status: DC | PRN

## 2013-07-09 MED ORDER — MORPHINE SULFATE 4 MG/ML IJ SOLN
4.0000 mg | Freq: Once | INTRAMUSCULAR | Status: AC
  Administered 2013-07-09: 4 mg via INTRAVENOUS
  Filled 2013-07-09: qty 1

## 2013-07-09 MED ORDER — SODIUM CHLORIDE 0.9 % IV BOLUS (SEPSIS)
500.0000 mL | Freq: Once | INTRAVENOUS | Status: AC
  Administered 2013-07-09: 02:00:00 via INTRAVENOUS

## 2013-07-09 MED ORDER — ONDANSETRON HCL 4 MG/2ML IJ SOLN
4.0000 mg | Freq: Once | INTRAMUSCULAR | Status: AC
Start: 2013-07-09 — End: 2013-07-09
  Administered 2013-07-09: 4 mg via INTRAVENOUS
  Filled 2013-07-09: qty 2

## 2013-07-09 MED ORDER — PROMETHAZINE HCL 25 MG/ML IJ SOLN
25.0000 mg | Freq: Once | INTRAMUSCULAR | Status: AC
  Administered 2013-07-09: 25 mg via INTRAVENOUS
  Filled 2013-07-09: qty 1

## 2013-07-09 NOTE — ED Notes (Signed)
Patient states that she is having upper abdominal pain, indicates epigastric area, for the past couple of days. States that the pain shoots up under her left arm. Also states that she has a headache.

## 2013-07-09 NOTE — ED Notes (Signed)
Patient states that she is still nauseated and that her pain is better.

## 2013-07-09 NOTE — Discharge Instructions (Signed)

## 2013-07-09 NOTE — ED Provider Notes (Signed)
CSN: [id number]     Arrival date & time 07/09/13  0002 History   First MD Initiated Contact with Patient 07/09/13 0018   This chart was scribed for [name] R. Rubin Payor, MD by Gwenevere Abbot, ED scribe. This patient was seen in room [facility] and the patient's care was started at 12:22 AM.    Chief Complaint  Patient presents with  . Abdominal Pain    The history is provided by the patient and a relative. No language interpreter was used.   HPI Comments:  [name] is a 43 y.o. female who presents to the Emergency Department complaining of sharp, intermittent abdominal pain, onset several days ago, worsened yesterday. She reports associated vomiting, and is unable to tolerate solids and liquids without inducing vomiting. She reports a h/o similar pain, states her current pain is consistent with past pain. A friend/relative reports an associated knot around her periumbilical region. Pt states this is painful. Pt also reports that doctors have said that she has kidney failure, hypertension. Pt reports use of pain pills, without relief.   Past Medical History  Diagnosis Date  . Scoliosis   . Hypertension   . Panic attack   . Back pain   . Depression   . Anxiety   . Ulcer   . Chronic back pain 02/04/13    By patient report   Past Surgical History  Procedure Laterality Date  . Back surgery    . Cesarean section    . Neck surgery    . Tubal ligation    . Cholecystectomy  11/27/2011    Procedure: LAPAROSCOPIC CHOLECYSTECTOMY;  Surgeon: Fabio Bering, MD;  Location: [facility];  Service: General;  Laterality: N/A;   Family History  Problem Relation Age of Onset  . Heart disease    . Arthritis    . Lung disease    . Cancer    . Asthma    . Diabetes     History  Substance Use Topics  . Smoking status: Current Every Day Smoker -- 2.00 packs/day for 25 years    Types: Cigarettes  . Smokeless tobacco: Never Used  . Alcohol Use: No   OB History   Grav Para Term Preterm  Abortions TAB SAB Ect Mult Living   4 3 3  1     3      Review of Systems  Constitutional: Negative for fever.  Cardiovascular: Negative for chest pain.  Gastrointestinal: Positive for nausea, vomiting, abdominal pain and abdominal distention. Negative for diarrhea.  Musculoskeletal: Negative for back pain.   Allergies  Penicillins; Ibuprofen; and Aspirin  Home Medications   Prior to Admission medications   Medication Sig Start Date End Date Taking? Authorizing Deseray Daponte  albuterol (PROVENTIL HFA;VENTOLIN HFA) 108 (90 BASE) MCG/ACT inhaler Inhale 2 puffs into the lungs every 6 (six) hours as needed. For shortness of breath   Yes Historical Glenice Ciccone, MD  ALPRAZolam Prudy Feeler) 1 MG tablet Take 2 mg by mouth 3 (three) times daily.    Yes Historical Rally Ouch, MD  citalopram (CELEXA) 20 MG tablet Take 20 mg by mouth daily.    Yes Historical Berthe Oley, MD  gabapentin (NEURONTIN) 300 MG capsule Take 300 mg by mouth 2 (two) times daily.    Yes Historical Devontae Casasola, MD  HYDROcodone-acetaminophen (NORCO) 7.5-325 MG per tablet Take 0.5 tablets by mouth 3 (three) times daily as needed for moderate pain.   Yes Historical Xaidyn Kepner, MD  lisinopril-hydrochlorothiazide (PRINZIDE,ZESTORETIC) 20-25 MG per tablet Take 1 tablet  by mouth daily.    Yes Historical Rameen Gohlke, MD  meloxicam (MOBIC) 15 MG tablet Take 15 mg by mouth daily.    Yes Historical Crystie Yanko, MD  methocarbamol (ROBAXIN) 750 MG tablet Take 750 mg by mouth 4 (four) times daily.     Yes Historical Rei Medlen, MD  pantoprazole (PROTONIX) 40 MG tablet Take 40 mg by mouth 2 (two) times daily.    Yes Historical Leul Narramore, MD  potassium chloride (K-DUR) 10 MEQ tablet Take 10 mEq by mouth daily.   Yes Historical Yannet Rincon, MD  pregabalin (LYRICA) 25 MG capsule Take 25 mg by mouth 2 (two) times daily.   Yes Historical Dominico Rod, MD  temazepam (RESTORIL) 30 MG capsule Take 60 mg by mouth at bedtime. For sleep   Yes Historical Faye Sanfilippo, MD  oxyCODONE-acetaminophen  (PERCOCET/ROXICET) 5-325 MG per tablet Take 1 tablet by mouth every 6 (six) hours as needed for severe pain. 05/09/13   Burgess AmorJulie Idol, PA-C  promethazine (PHENERGAN) 25 MG tablet Take 1 tablet (25 mg total) by mouth every 6 (six) hours as needed for nausea. 07/09/13   Juliet RudeNathan R. Pickering, MD  sucralfate (CARAFATE) 1 G tablet Take 1 g by mouth 2 (two) times daily. 02/21/13   Donnetta HutchingBrian Cook, MD   Ht 5\' 2"  (1.575 m)  Wt 205 lb (92.987 kg)  BMI 37.49 kg/m2  LMP 06/20/2013 Physical Exam  Nursing note and vitals reviewed. Constitutional: She is oriented to person, place, and time. She appears well-developed and well-nourished. No distress.  HENT:  Head: Normocephalic and atraumatic.  Eyes: Conjunctivae and EOM are normal. Pupils are equal, round, and reactive to light.  Neck: Normal range of motion. Neck supple.  Cardiovascular: Normal rate, regular rhythm and normal heart sounds.   No murmur heard. Pulmonary/Chest: Effort normal and breath sounds normal. No respiratory distress. She exhibits no tenderness.  Abdominal: Soft. Bowel sounds are normal. She exhibits distension. There is tenderness (supraumbilical, and abdominal regions).  Tenderness in upper abdomen with bulging supraperiumbilical region. No peripheral edema.  Musculoskeletal: Normal range of motion. She exhibits no edema.  Neurological: She is alert and oriented to person, place, and time. No cranial nerve deficit. She exhibits normal muscle tone. Coordination normal.  Skin: Skin is warm and dry.  Psychiatric: She has a normal mood and affect. Her behavior is normal.    ED Course  Procedures (including critical care time)  COORDINATION OF CARE: 12:31 AM-Discussed treatment plan with pt at bedside and pt agreed to plan.   EKG Interpretation None      MDM   Final diagnoses:  Pain of upper abdomen    Patient with acute on chronic upper abdominal pain. Has had cholecystectomy. Has had 3 negative CT scans in the last year. Today  has minimally elevated LFTs. Lipase is normal. Patient feels better and will be discharged home. Will likely require primary care followup and possibly GI followup  I personally performed the services described in this documentation, which was scribed in my presence. The recorded information has been reviewed and is accurate.       Juliet RudeNathan R. Rubin PayorPickering, MD 07/09/13 [id number]

## 2013-07-23 ENCOUNTER — Encounter (HOSPITAL_COMMUNITY): Payer: Self-pay | Admitting: Emergency Medicine

## 2013-07-23 ENCOUNTER — Emergency Department (HOSPITAL_COMMUNITY)
Admission: EM | Admit: 2013-07-23 | Discharge: 2013-07-23 | Disposition: A | Discharge status: Home / Self Care | Payer: Medicaid Other | Attending: Emergency Medicine | Admitting: Emergency Medicine

## 2013-07-23 DIAGNOSIS — F17200 Nicotine dependence, unspecified, uncomplicated: Secondary | ICD-10-CM | POA: Insufficient documentation

## 2013-07-23 DIAGNOSIS — Z791 Long term (current) use of non-steroidal anti-inflammatories (NSAID): Secondary | ICD-10-CM | POA: Diagnosis not present

## 2013-07-23 DIAGNOSIS — H65199 Other acute nonsuppurative otitis media, unspecified ear: Secondary | ICD-10-CM | POA: Diagnosis not present

## 2013-07-23 DIAGNOSIS — J370 Chronic laryngitis: Secondary | ICD-10-CM | POA: Insufficient documentation

## 2013-07-23 DIAGNOSIS — Z872 Personal history of diseases of the skin and subcutaneous tissue: Secondary | ICD-10-CM | POA: Insufficient documentation

## 2013-07-23 DIAGNOSIS — F410 Panic disorder [episodic paroxysmal anxiety] without agoraphobia: Secondary | ICD-10-CM | POA: Insufficient documentation

## 2013-07-23 DIAGNOSIS — Z79899 Other long term (current) drug therapy: Secondary | ICD-10-CM | POA: Diagnosis not present

## 2013-07-23 DIAGNOSIS — Z880 Allergy status to penicillin: Secondary | ICD-10-CM | POA: Insufficient documentation

## 2013-07-23 DIAGNOSIS — G8929 Other chronic pain: Secondary | ICD-10-CM | POA: Diagnosis not present

## 2013-07-23 DIAGNOSIS — F3289 Other specified depressive episodes: Secondary | ICD-10-CM | POA: Insufficient documentation

## 2013-07-23 DIAGNOSIS — Z8739 Personal history of other diseases of the musculoskeletal system and connective tissue: Secondary | ICD-10-CM | POA: Diagnosis not present

## 2013-07-23 DIAGNOSIS — F329 Major depressive disorder, single episode, unspecified: Secondary | ICD-10-CM | POA: Diagnosis not present

## 2013-07-23 DIAGNOSIS — J029 Acute pharyngitis, unspecified: Secondary | ICD-10-CM | POA: Diagnosis present

## 2013-07-23 DIAGNOSIS — I10 Essential (primary) hypertension: Secondary | ICD-10-CM | POA: Diagnosis not present

## 2013-07-23 DIAGNOSIS — H65191 Other acute nonsuppurative otitis media, right ear: Secondary | ICD-10-CM

## 2013-07-23 MED ORDER — BENZONATATE 100 MG PO CAPS: 100.0000 mg | ORAL_CAPSULE | Freq: Three times a day (TID) | ORAL | Status: DC | PRN

## 2013-07-23 MED ORDER — AZITHROMYCIN 250 MG PO TABS
500.0000 mg | ORAL_TABLET | Freq: Once | ORAL | Status: AC
  Administered 2013-07-23: 500 mg via ORAL
  Filled 2013-07-23: qty 2

## 2013-07-23 MED ORDER — AZITHROMYCIN 250 MG PO TABS: 250.0000 mg | ORAL_TABLET | Freq: Every day | ORAL | Status: DC

## 2013-07-23 MED ORDER — BENZONATATE 100 MG PO CAPS
200.0000 mg | ORAL_CAPSULE | Freq: Once | ORAL | Status: AC
  Administered 2013-07-23: 200 mg via ORAL
  Filled 2013-07-23: qty 2

## 2013-07-23 MED ORDER — ANTIPYRINE-BENZOCAINE 5.4-1.4 % OT SOLN: 3.0000 [drp] | OTIC | Status: DC | PRN

## 2013-07-23 NOTE — ED Notes (Signed)
Patient with no complaints at this time. Respirations even and unlabored. Skin warm/dry. Discharge instructions reviewed with patient at this time. Patient given opportunity to voice concerns/ask questions. Patient discharged at this time and left Emergency Department with steady gait.   

## 2013-07-23 NOTE — ED Provider Notes (Signed)
CSN: [id number]     Arrival date & time 07/23/13  1517 History   First MD Initiated Contact with Patient 07/23/13 1616     Chief Complaint  Patient presents with  . Sore Throat  . Otalgia     (Consider location/radiation/quality/duration/timing/severity/associated sxs/prior Treatment) Patient is a 43 y.o. female presenting with pharyngitis and ear pain. The history is provided by the patient.  Sore Throat This is a new problem. Episode onset: 3 Days. The problem occurs constantly. The problem has been unchanged. Associated symptoms include coughing and a sore throat. Pertinent negatives include no abdominal pain, chest pain, chills, congestion, fever, headaches, nausea, neck pain, numbness, rash, swollen glands, visual change, vomiting or weakness. Associated symptoms comments: Right ear pain and laryngitis for greater than 4 months. The symptoms are aggravated by swallowing. She has tried ice for the symptoms. The treatment provided no relief.  Otalgia Location:  Right Behind ear:  No abnormality Quality:  Aching Severity:  Mild Onset quality:  Gradual Timing:  Constant Progression:  Unchanged Chronicity:  New Relieved by:  Nothing Worsened by:  Coughing Ineffective treatments:  None tried Associated symptoms: cough and sore throat   Associated symptoms: no abdominal pain, no congestion, no ear discharge, no fever, no headaches, no hearing loss, no neck pain, no rash, no rhinorrhea, no tinnitus and no vomiting     Past Medical History  Diagnosis Date  . Scoliosis   . Hypertension   . Panic attack   . Back pain   . Depression   . Anxiety   . Ulcer   . Chronic back pain 02/04/13    By patient report   Past Surgical History  Procedure Laterality Date  . Back surgery    . Cesarean section    . Neck surgery    . Tubal ligation    . Cholecystectomy  11/27/2011    Procedure: LAPAROSCOPIC CHOLECYSTECTOMY;  Surgeon: Fabio Bering, MD;  Location: [facility];  Service: General;   Laterality: N/A;   Family History  Problem Relation Age of Onset  . Heart disease    . Arthritis    . Lung disease    . Cancer    . Asthma    . Diabetes     History  Substance Use Topics  . Smoking status: Current Every Day Smoker -- 2.00 packs/day for 25 years    Types: Cigarettes  . Smokeless tobacco: Never Used  . Alcohol Use: No   OB History   Grav Para Term Preterm Abortions TAB SAB Ect Mult Living   4 3 3  1     3      Review of Systems  Constitutional: Negative for fever, chills, activity change and appetite change.  HENT: Positive for ear pain, sore throat and voice change. Negative for congestion, ear discharge, facial swelling, hearing loss, rhinorrhea, tinnitus and trouble swallowing.   Eyes: Negative for visual disturbance.  Respiratory: Positive for cough. Negative for chest tightness, shortness of breath, wheezing and stridor.   Cardiovascular: Negative for chest pain.  Gastrointestinal: Negative for nausea, vomiting and abdominal pain.  Musculoskeletal: Negative for neck pain and neck stiffness.  Skin: Negative.  Negative for rash.  Neurological: Negative for dizziness, speech difficulty, weakness, numbness and headaches.  Hematological: Negative for adenopathy.  Psychiatric/Behavioral: Negative for confusion.  All other systems reviewed and are negative.     Allergies  Penicillins; Ibuprofen; and Aspirin  Home Medications   Prior to Admission medications   Medication Sig  Start Date End Date Taking? Authorizing Provider  ALPRAZolam Prudy Feeler) 1 MG tablet Take 2 mg by mouth 4 (four) times daily.    Yes Historical Provider, MD  citalopram (CELEXA) 20 MG tablet Take 20 mg by mouth daily.    Yes Historical Provider, MD  lisinopril-hydrochlorothiazide (PRINZIDE,ZESTORETIC) 20-25 MG per tablet Take 1 tablet by mouth daily.    Yes Historical Provider, MD  meloxicam (MOBIC) 15 MG tablet Take 15 mg by mouth daily.    Yes Historical Provider, MD  methocarbamol  (ROBAXIN) 750 MG tablet Take 750 mg by mouth 4 (four) times daily.     Yes Historical Provider, MD  pantoprazole (PROTONIX) 40 MG tablet Take 40 mg by mouth 2 (two) times daily.    Yes Historical Provider, MD  potassium chloride (K-DUR) 10 MEQ tablet Take 10 mEq by mouth daily.   Yes Historical Provider, MD  pregabalin (LYRICA) 100 MG capsule Take 100 mg by mouth 3 (three) times daily.   Yes Historical Provider, MD  sucralfate (CARAFATE) 1 G tablet Take 1 g by mouth 2 (two) times daily. 02/21/13  Yes Donnetta Hutching, MD  tapentadol (NUCYNTA) 50 MG TABS tablet Take 50 mg by mouth every 6 (six) hours.   Yes Historical Provider, MD  temazepam (RESTORIL) 30 MG capsule Take 60 mg by mouth at bedtime. For sleep   Yes Historical Provider, MD  albuterol (PROVENTIL HFA;VENTOLIN HFA) 108 (90 BASE) MCG/ACT inhaler Inhale 2 puffs into the lungs every 6 (six) hours as needed. For shortness of breath    Historical Provider, MD  promethazine (PHENERGAN) 25 MG tablet Take 1 tablet (25 mg total) by mouth every 6 (six) hours as needed for nausea. 07/09/13   Juliet Rude. Pickering, MD   BP 123/84  Pulse 64  Temp(Src) 97.7 F (36.5 C) (Oral)  Resp 20  Ht 5\' 2"  (1.575 m)  Wt 190 lb (86.183 kg)  BMI 34.74 kg/m2  SpO2 98%  LMP 07/23/2013 Physical Exam  Nursing note and vitals reviewed. Constitutional: She is oriented to person, place, and time. She appears well-developed and well-nourished. No distress.  HENT:  Head: Normocephalic and atraumatic.  Right Ear: Ear canal normal. No drainage, swelling or tenderness. No mastoid tenderness. Tympanic membrane is erythematous. A middle ear effusion is present. No hemotympanum.  Left Ear: Tympanic membrane and ear canal normal.  Nose: Mucosal edema and rhinorrhea present.  Mouth/Throat: Uvula is midline and mucous membranes are normal. No trismus in the jaw. No uvula swelling. Posterior oropharyngeal erythema present. No oropharyngeal exudate, posterior oropharyngeal edema or  tonsillar abscesses.  Eyes: Conjunctivae are normal.  Neck: Normal range of motion and phonation normal. Neck supple. No Brudzinski's sign and no Kernig's sign noted.  Cardiovascular: Normal rate, regular rhythm, normal heart sounds and intact distal pulses.   No murmur heard. Pulmonary/Chest: Effort normal and breath sounds normal. No respiratory distress. She has no wheezes. She has no rales.  Abdominal: Soft. She exhibits no distension. There is no tenderness. There is no rebound and no guarding.  Musculoskeletal: She exhibits no edema.  Lymphadenopathy:    She has no cervical adenopathy.  Neurological: She is alert and oriented to person, place, and time. She exhibits normal muscle tone. Coordination normal.  Skin: Skin is warm and dry.    ED Course  Procedures (including critical care time) Labs Review Labs Reviewed - No data to display  Imaging Review No results found.   EKG Interpretation None      MDM  Final diagnoses:  Chronic laryngitis  Other acute nonsuppurative otitis media of right ear    Patient is well appearing. Vital signs are stable. Airway is patent. Patient does report chronic laryngitis for several months and given that the patient has a history of smoking along with acid reflux is concerning for an issue distal to the oropharynx. I will therefore give the patient referral to ENT and she agrees to symptomatic treatment for the otitis media and Tessalon Perles for cough which is believed to be likely related to a postnasal drip.      Kambry Takacs L. Trisha Mangleriplett, PA-C 07/23/13 1654

## 2013-07-23 NOTE — ED Notes (Addendum)
Patient c/o sore throat with dry cough x3 days that has progressively gotten worse.patient also reports right ear ache that started 3 days. Denies any fevers. Per patient painful to swallow but no difficulty in swallowing or breathing.

## 2013-07-23 NOTE — ED Provider Notes (Signed)
.  [facility] Medical screening examination/treatment/procedure(s) were performed by non-physician practitioner and as supervising physician I was immediately available for consultation/collaboration.   EKG Interpretation None        [name], MD 07/23/13 2232

## 2013-07-23 NOTE — Discharge Instructions (Signed)
Otitis Media Otitis media is redness, soreness, and puffiness (swelling) in the space just behind your eardrum (middle ear). It may be caused by allergies or infection. It often happens along with a cold. HOME CARE  Take your medicine as told. Finish it even if you start to feel better.  Only take over-the-counter or prescription medicines for pain, discomfort, or fever as told by your doctor.  Follow up with your doctor as told. GET HELP IF:  You have otitis media only in one ear, or bleeding from your nose, or both.  You notice a lump on your neck.  You are not getting better in 3-5 days.  You feel worse instead of better. GET HELP RIGHT AWAY IF:   You have pain that is not helped with medicine.  You have puffiness, redness, or pain around your ear.  You get a stiff neck.  You cannot move part of your face (paralysis).  You notice that the bone behind your ear hurts when you touch it. MAKE SURE YOU:   Understand these instructions.  Will watch your condition.  Will get help right away if you are not doing well or get worse. Document Released: 06/17/2007 Document Revised: 01/03/2013 Document Reviewed: 07/26/2012 [facility] Patient Information 2015 [place], [place]. This information is not intended to replace advice given to you by your health care provider. Make sure you discuss any questions you have with your health care provider.  Laryngitis Laryngitis is redness, soreness, and puffiness (inflammation) of the vocal cords. It causes hoarseness, cough, loss of voice, sore throat, and dry throat. It may be caused by:  Infection.  Too much smoking.  Too much talking or yelling.  Breathing in of toxic fumes.  Allergies.  A backup of acid from your stomach. HOME CARE  Drink enough fluids to keep your pee (urine) clear or pale yellow.  Rest until you no longer have problems or as told by your doctor.  Breathe in moist air.  Take all medicine as told by your  doctor.  Do not smoke.  Talk as little as possible (this includes whispering).  Write on paper instead of talking until your voice is back to normal.  Follow up with your doctor if you have not improved after 10 days. GET HELP IF:   You have trouble breathing.  You cough up blood.  You have a fever that will not go away.  You have increasing pain.  You have trouble swallowing. MAKE SURE YOU:  Understand these instructions.  Will watch your condition.  Will get help right away if you are not doing well or get worse. Document Released: 12/18/2010 Document Revised: 03/23/2011 Document Reviewed: 12/18/2010 [facility] Patient Information 2015 [place], [place]. This information is not intended to replace advice given to you by your health care provider. Make sure you discuss any questions you have with your health care provider.

## 2013-08-01 ENCOUNTER — Emergency Department (HOSPITAL_COMMUNITY)
Admission: EM | Admit: 2013-08-01 | Discharge: 2013-08-01 | Disposition: A | Discharge status: Home / Self Care | Payer: Medicaid Other | Attending: Emergency Medicine | Admitting: Emergency Medicine

## 2013-08-01 ENCOUNTER — Encounter (HOSPITAL_COMMUNITY): Payer: Self-pay | Admitting: Emergency Medicine

## 2013-08-01 DIAGNOSIS — H9209 Otalgia, unspecified ear: Secondary | ICD-10-CM | POA: Diagnosis present

## 2013-08-01 DIAGNOSIS — G8929 Other chronic pain: Secondary | ICD-10-CM | POA: Diagnosis not present

## 2013-08-01 DIAGNOSIS — J040 Acute laryngitis: Secondary | ICD-10-CM | POA: Diagnosis not present

## 2013-08-01 DIAGNOSIS — F329 Major depressive disorder, single episode, unspecified: Secondary | ICD-10-CM | POA: Insufficient documentation

## 2013-08-01 DIAGNOSIS — F17200 Nicotine dependence, unspecified, uncomplicated: Secondary | ICD-10-CM | POA: Diagnosis not present

## 2013-08-01 DIAGNOSIS — I10 Essential (primary) hypertension: Secondary | ICD-10-CM | POA: Insufficient documentation

## 2013-08-01 DIAGNOSIS — Z79899 Other long term (current) drug therapy: Secondary | ICD-10-CM | POA: Diagnosis not present

## 2013-08-01 DIAGNOSIS — Z792 Long term (current) use of antibiotics: Secondary | ICD-10-CM | POA: Diagnosis not present

## 2013-08-01 DIAGNOSIS — Z872 Personal history of diseases of the skin and subcutaneous tissue: Secondary | ICD-10-CM | POA: Diagnosis not present

## 2013-08-01 DIAGNOSIS — F3289 Other specified depressive episodes: Secondary | ICD-10-CM | POA: Insufficient documentation

## 2013-08-01 DIAGNOSIS — Z8709 Personal history of other diseases of the respiratory system: Secondary | ICD-10-CM | POA: Insufficient documentation

## 2013-08-01 DIAGNOSIS — Z8739 Personal history of other diseases of the musculoskeletal system and connective tissue: Secondary | ICD-10-CM | POA: Diagnosis not present

## 2013-08-01 DIAGNOSIS — F411 Generalized anxiety disorder: Secondary | ICD-10-CM | POA: Diagnosis not present

## 2013-08-01 DIAGNOSIS — H6640 Suppurative otitis media, unspecified, unspecified ear: Secondary | ICD-10-CM | POA: Insufficient documentation

## 2013-08-01 DIAGNOSIS — Z880 Allergy status to penicillin: Secondary | ICD-10-CM | POA: Insufficient documentation

## 2013-08-01 DIAGNOSIS — H66004 Acute suppurative otitis media without spontaneous rupture of ear drum, recurrent, right ear: Secondary | ICD-10-CM

## 2013-08-01 HISTORY — DX: Chronic laryngitis: J37.0

## 2013-08-01 MED ORDER — SULFAMETHOXAZOLE-TRIMETHOPRIM 800-160 MG PO TABS: 1.0000 | ORAL_TABLET | Freq: Two times a day (BID) | ORAL | Status: DC

## 2013-08-01 MED ORDER — GUAIFENESIN-CODEINE 100-10 MG/5ML PO SOLN: 5.0000 mL | Freq: Four times a day (QID) | ORAL | Status: DC | PRN

## 2013-08-01 NOTE — ED Provider Notes (Signed)
CSN: [id number]     Arrival date & time 08/01/13  2144 History   First MD Initiated Contact with Patient 08/01/13 2254     Chief Complaint  Patient presents with  . Otalgia     (Consider location/radiation/quality/duration/timing/severity/associated sxs/prior Treatment) HPI Comments: 43 year old female, history of tobacco use, states that she has had increased ear pain on the right over the last several days, recently treated for otitis media one week ago with Zithromax and ear drops. She states that the pain is worsened, she has a muffled sound in her right ear and also complains of ongoing laryngitis which has caused her to have a hoarse voice over the last 6 months. The patient has not followed up with her family doctor for this. The patient denies fevers chills nausea or vomiting, she does have occasional cough and requests a codeine containing medication to help with this  Patient is a 43 y.o. female presenting with ear pain. The history is provided by the patient.  Otalgia   Past Medical History  Diagnosis Date  . Scoliosis   . Hypertension   . Panic attack   . Back pain   . Depression   . Anxiety   . Ulcer   . Chronic back pain 02/04/13    By patient report  . Laryngitis, chronic    Past Surgical History  Procedure Laterality Date  . Back surgery    . Cesarean section    . Neck surgery    . Tubal ligation    . Cholecystectomy  11/27/2011    Procedure: LAPAROSCOPIC CHOLECYSTECTOMY;  Surgeon: Fabio BeringBrent C Ziegler, MD;  Location: [facility];  Service: General;  Laterality: N/A;   Family History  Problem Relation Age of Onset  . Heart disease    . Arthritis    . Lung disease    . Cancer    . Asthma    . Diabetes     History  Substance Use Topics  . Smoking status: Current Every Day Smoker -- 2.00 packs/day for 25 years    Types: Cigarettes  . Smokeless tobacco: Never Used  . Alcohol Use: No   OB History   Grav Para Term Preterm Abortions TAB SAB Ect Mult Living   4 3  3  1     3      Review of Systems  HENT: Positive for ear pain.   All other systems reviewed and are negative.     Allergies  Penicillins; Ibuprofen; and Aspirin  Home Medications   Prior to Admission medications   Medication Sig Start Date End Date Taking? Authorizing Provider  albuterol (PROVENTIL HFA;VENTOLIN HFA) 108 (90 BASE) MCG/ACT inhaler Inhale 2 puffs into the lungs every 6 (six) hours as needed. For shortness of breath    Historical Provider, MD  ALPRAZolam Prudy Feeler(XANAX) 1 MG tablet Take 2 mg by mouth 4 (four) times daily.     Historical Provider, MD  antipyrine-benzocaine Lyla Son(AURALGAN) otic solution Place 3-4 drops into the right ear every 2 (two) hours as needed for ear pain. 07/23/13   [name], PA-C  azithromycin (ZITHROMAX) 250 MG tablet Take 1 tablet (250 mg total) by mouth daily. Take first 2 tablets together, then 1 every day until finished. 07/23/13   [name], PA-C  benzonatate (TESSALON) 100 MG capsule Take 1 capsule (100 mg total) by mouth 3 (three) times daily as needed for cough. Swallow whole do not chew 07/23/13   [name], PA-C  citalopram (CELEXA)  20 MG tablet Take 20 mg by mouth daily.     Historical Provider, MD  guaiFENesin-codeine 100-10 MG/5ML syrup Take 5 mLs by mouth every 6 (six) hours as needed for cough. 08/01/13   Vida Roller, MD  lisinopril-hydrochlorothiazide (PRINZIDE,ZESTORETIC) 20-25 MG per tablet Take 1 tablet by mouth daily.     Historical Provider, MD  meloxicam (MOBIC) 15 MG tablet Take 15 mg by mouth daily.     Historical Provider, MD  methocarbamol (ROBAXIN) 750 MG tablet Take 750 mg by mouth 4 (four) times daily.      Historical Provider, MD  pantoprazole (PROTONIX) 40 MG tablet Take 40 mg by mouth 2 (two) times daily.     Historical Provider, MD  potassium chloride (K-DUR) 10 MEQ tablet Take 10 mEq by mouth daily.    Historical Provider, MD  pregabalin (LYRICA) 100 MG capsule Take 100 mg by mouth 3 (three) times daily.     Historical Provider, MD  promethazine (PHENERGAN) 25 MG tablet Take 1 tablet (25 mg total) by mouth every 6 (six) hours as needed for nausea. 07/09/13   Juliet Rude. Pickering, MD  sucralfate (CARAFATE) 1 G tablet Take 1 g by mouth 2 (two) times daily. 02/21/13   Donnetta Hutching, MD  sulfamethoxazole-trimethoprim (SEPTRA DS) 800-160 MG per tablet Take 1 tablet by mouth every 12 (twelve) hours. 08/01/13   Vida Roller, MD  tapentadol (NUCYNTA) 50 MG TABS tablet Take 50 mg by mouth every 6 (six) hours.    Historical Provider, MD  temazepam (RESTORIL) 30 MG capsule Take 60 mg by mouth at bedtime. For sleep    Historical Provider, MD   BP 115/72  Pulse 73  Temp(Src) 98.4 F (36.9 C)  Resp 16  Ht 5\' 2"  (1.575 m)  Wt 190 lb (86.183 kg)  BMI 34.74 kg/m2  SpO2 98%  LMP 07/23/2013 Physical Exam  Nursing note and vitals reviewed. Constitutional: She appears well-developed and well-nourished. No distress.  HENT:  Head: Normocephalic and atraumatic.  Mouth/Throat: Oropharynx is clear and moist. No oropharyngeal exudate.  Right external auditory canal is well visualized, there is no tenderness with manipulation of the auricle or the tragus, there is no desquamation or inflammation of the canal, there is some cerumen and an opacified bulging erythematous tympanic membrane behind the wax. Left tympanic membranes well visualized appears clear and open, nostrils clear without swelling of the turbinates, oropharynx is clear and moist without erythema exudate asymmetry or hypertrophy. The voice is hoarse  Eyes: Conjunctivae and EOM are normal. Pupils are equal, round, and reactive to light. Right eye exhibits no discharge. Left eye exhibits no discharge. No scleral icterus.  Neck: Normal range of motion. Neck supple. No JVD present. No thyromegaly present.  Cardiovascular: Normal rate, regular rhythm, normal heart sounds and intact distal pulses.  Exam reveals no gallop and no friction rub.   No murmur  heard. Pulmonary/Chest: Effort normal and breath sounds normal. No respiratory distress. She has no wheezes. She has no rales.  Abdominal: Soft. Bowel sounds are normal. She exhibits no distension and no mass. There is no tenderness.  Musculoskeletal: Normal range of motion. She exhibits no edema and no tenderness.  Lymphadenopathy:    She has no cervical adenopathy.  Neurological: She is alert. Coordination normal.  Skin: Skin is warm and dry. No rash noted. No erythema.  Psychiatric: She has a normal mood and affect. Her behavior is normal.    ED Course  Procedures (including critical care time)  Labs Review Labs Reviewed - No data to display  Imaging Review No results found.    MDM   Final diagnoses:  Recurrent suppurative otitis media of right ear without spontaneous rupture of tympanic membrane, unspecified chronicity  Laryngitis    The patient's exam is focal for what appears to be otitis media, this is recurrent based on the patient's report and not responded to medications such as Zithromax and given in the prior 10 days. I have warned her that ongoing course this may be a sign of an underlying mass or malignancy and then be related to an ongoing laryngitis infection. The patient has been informed that she needs to follow up with ear nose and throat if her symptoms do not resolve within the next one to 2 weeks, her family doctor can facilitate this referral. The patient is in agreement with the plan.   Meds given in ED:  Medications - No data to display  New Prescriptions   GUAIFENESIN-CODEINE 100-10 MG/5ML SYRUP    Take 5 mLs by mouth every 6 (six) hours as needed for cough.   SULFAMETHOXAZOLE-TRIMETHOPRIM (SEPTRA DS) 800-160 MG PER TABLET    Take 1 tablet by mouth every 12 (twelve) hours.        Vida Roller, MD 08/01/13 [phone]

## 2013-08-01 NOTE — ED Notes (Signed)
Seen here on 7/12 for earache. Now pain persists in both ears and also has sore throat

## 2013-08-01 NOTE — Discharge Instructions (Signed)
Please call your doctor for a followup appointment within 24-48 hours. When you talk to your doctor please let them know that you were seen in the emergency department and have them acquire all of your records so that they can discuss the findings with you and formulate a treatment plan to fully care for your new and ongoing problems. ° °

## 2013-09-05 ENCOUNTER — Emergency Department (HOSPITAL_COMMUNITY): Payer: Medicaid Other

## 2013-09-05 ENCOUNTER — Emergency Department (HOSPITAL_COMMUNITY)
Admission: EM | Admit: 2013-09-05 | Discharge: 2013-09-05 | Disposition: A | Discharge status: Home / Self Care | Payer: Medicaid Other | Attending: Emergency Medicine | Admitting: Emergency Medicine

## 2013-09-05 ENCOUNTER — Encounter (HOSPITAL_COMMUNITY): Payer: Self-pay | Admitting: Emergency Medicine

## 2013-09-05 DIAGNOSIS — F410 Panic disorder [episodic paroxysmal anxiety] without agoraphobia: Secondary | ICD-10-CM | POA: Diagnosis not present

## 2013-09-05 DIAGNOSIS — Z791 Long term (current) use of non-steroidal anti-inflammatories (NSAID): Secondary | ICD-10-CM | POA: Diagnosis not present

## 2013-09-05 DIAGNOSIS — Z872 Personal history of diseases of the skin and subcutaneous tissue: Secondary | ICD-10-CM | POA: Diagnosis not present

## 2013-09-05 DIAGNOSIS — R109 Unspecified abdominal pain: Secondary | ICD-10-CM

## 2013-09-05 DIAGNOSIS — Z79899 Other long term (current) drug therapy: Secondary | ICD-10-CM | POA: Diagnosis not present

## 2013-09-05 DIAGNOSIS — Z880 Allergy status to penicillin: Secondary | ICD-10-CM | POA: Diagnosis not present

## 2013-09-05 DIAGNOSIS — R1013 Epigastric pain: Secondary | ICD-10-CM | POA: Insufficient documentation

## 2013-09-05 DIAGNOSIS — I10 Essential (primary) hypertension: Secondary | ICD-10-CM | POA: Insufficient documentation

## 2013-09-05 DIAGNOSIS — F17200 Nicotine dependence, unspecified, uncomplicated: Secondary | ICD-10-CM | POA: Diagnosis not present

## 2013-09-05 DIAGNOSIS — Z8709 Personal history of other diseases of the respiratory system: Secondary | ICD-10-CM | POA: Insufficient documentation

## 2013-09-05 DIAGNOSIS — F329 Major depressive disorder, single episode, unspecified: Secondary | ICD-10-CM | POA: Diagnosis not present

## 2013-09-05 DIAGNOSIS — Z8739 Personal history of other diseases of the musculoskeletal system and connective tissue: Secondary | ICD-10-CM | POA: Insufficient documentation

## 2013-09-05 DIAGNOSIS — F3289 Other specified depressive episodes: Secondary | ICD-10-CM | POA: Diagnosis not present

## 2013-09-05 DIAGNOSIS — G8929 Other chronic pain: Secondary | ICD-10-CM | POA: Diagnosis not present

## 2013-09-05 LAB — CBC WITH DIFFERENTIAL/PLATELET
BASOS PCT: 0 % (ref 0–1)
Basophils Absolute: 0 10*3/uL (ref 0.0–0.1)
EOS ABS: 0.2 10*3/uL (ref 0.0–0.7)
EOS PCT: 1 % (ref 0–5)
HCT: 41.2 % (ref 36.0–46.0)
Hemoglobin: 14.3 g/dL (ref 12.0–15.0)
Lymphocytes Relative: 21 % (ref 12–46)
Lymphs Abs: 2.6 10*3/uL (ref 0.7–4.0)
MCH: 32.9 pg (ref 26.0–34.0)
MCHC: 34.7 g/dL (ref 30.0–36.0)
MCV: 94.7 fL (ref 78.0–100.0)
Monocytes Absolute: 0.9 10*3/uL (ref 0.1–1.0)
Monocytes Relative: 7 % (ref 3–12)
Neutro Abs: 8.7 10*3/uL — ABNORMAL HIGH (ref 1.7–7.7)
Neutrophils Relative %: 71 % (ref 43–77)
Platelets: 228 10*3/uL (ref 150–400)
RBC: 4.35 MIL/uL (ref 3.87–5.11)
RDW: 13.4 % (ref 11.5–15.5)
WBC: 12.4 10*3/uL — AB (ref 4.0–10.5)

## 2013-09-05 LAB — COMPREHENSIVE METABOLIC PANEL
ALBUMIN: 3.8 g/dL (ref 3.5–5.2)
ALT: 11 U/L (ref 0–35)
AST: 11 U/L (ref 0–37)
Alkaline Phosphatase: 193 U/L — ABNORMAL HIGH (ref 39–117)
Anion gap: 15 (ref 5–15)
BUN: 15 mg/dL (ref 6–23)
CO2: 26 mEq/L (ref 19–32)
Calcium: 9.1 mg/dL (ref 8.4–10.5)
Chloride: 101 mEq/L (ref 96–112)
Creatinine, Ser: 1.36 mg/dL — ABNORMAL HIGH (ref 0.50–1.10)
GFR calc Af Amer: 55 mL/min — ABNORMAL LOW (ref >90)
GFR calc non Af Amer: 47 mL/min — ABNORMAL LOW (ref >90)
Glucose, Bld: 91 mg/dL (ref 70–99)
Potassium: 3.7 mEq/L (ref 3.7–5.3)
SODIUM: 142 meq/L (ref 137–147)
TOTAL PROTEIN: 7.6 g/dL (ref 6.0–8.3)
Total Bilirubin: 0.3 mg/dL (ref 0.3–1.2)

## 2013-09-05 LAB — LIPASE, BLOOD: Lipase: 47 U/L (ref 11–59)

## 2013-09-05 MED ORDER — SUCRALFATE 1 G PO TABS
ORAL_TABLET | ORAL | Status: AC
  Filled 2013-09-05: qty 1

## 2013-09-05 MED ORDER — SUCRALFATE 1 G PO TABS
1.0000 g | ORAL_TABLET | Freq: Once | ORAL | Status: AC
  Administered 2013-09-05: 1 g via ORAL
  Filled 2013-09-05: qty 1

## 2013-09-05 MED ORDER — SUCRALFATE 1 G PO TABS: 1.0000 g | ORAL_TABLET | Freq: Three times a day (TID) | ORAL | Status: DC

## 2013-09-05 MED ORDER — ONDANSETRON HCL 4 MG/2ML IJ SOLN
4.0000 mg | Freq: Once | INTRAMUSCULAR | Status: AC
  Administered 2013-09-05: 4 mg via INTRAVENOUS
  Filled 2013-09-05: qty 2

## 2013-09-05 MED ORDER — HYDROCODONE-ACETAMINOPHEN 5-325 MG PO TABS: 1.0000 | ORAL_TABLET | Freq: Four times a day (QID) | ORAL | Status: DC | PRN

## 2013-09-05 MED ORDER — HYDROMORPHONE HCL PF 1 MG/ML IJ SOLN
1.0000 mg | Freq: Once | INTRAMUSCULAR | Status: AC
  Administered 2013-09-05: 1 mg via INTRAVENOUS
  Filled 2013-09-05: qty 1

## 2013-09-05 NOTE — ED Provider Notes (Signed)
CSN: [id number]     Arrival date & time 09/05/13  1657 History   First MD Initiated Contact with Patient 09/05/13 1950     Chief Complaint  Patient presents with  . Abdominal Pain     (Consider location/radiation/quality/duration/timing/severity/associated sxs/prior Treatment) Patient is a 43 y.o. female presenting with abdominal pain. The history is provided by the patient (the pt complains of epigastric abd pain).  Abdominal Pain Pain location:  Epigastric Pain quality: aching   Pain radiates to:  Does not radiate Pain severity:  Moderate Onset quality:  Sudden Timing:  Constant Progression:  Worsening Associated symptoms: no chest pain, no cough, no diarrhea, no fatigue and no hematuria     Past Medical History  Diagnosis Date  . Scoliosis   . Hypertension   . Panic attack   . Back pain   . Depression   . Anxiety   . Ulcer   . Chronic back pain 02/04/13    By patient report  . Laryngitis, chronic    Past Surgical History  Procedure Laterality Date  . Back surgery    . Cesarean section    . Neck surgery    . Tubal ligation    . Cholecystectomy  11/27/2011    Procedure: LAPAROSCOPIC CHOLECYSTECTOMY;  Surgeon: Fabio Bering, MD;  Location: [facility];  Service: General;  Laterality: N/A;   Family History  Problem Relation Age of Onset  . Heart disease    . Arthritis    . Lung disease    . Cancer    . Asthma    . Diabetes     History  Substance Use Topics  . Smoking status: Current Every Day Smoker -- 2.00 packs/day for 25 years    Types: Cigarettes  . Smokeless tobacco: Never Used  . Alcohol Use: No   OB History   Grav Para Term Preterm Abortions TAB SAB Ect Mult Living   4 3 3  1     3      Review of Systems  Constitutional: Negative for appetite change and fatigue.  HENT: Negative for congestion, ear discharge and sinus pressure.   Eyes: Negative for discharge.  Respiratory: Negative for cough.   Cardiovascular: Negative for chest pain.   Gastrointestinal: Positive for abdominal pain. Negative for diarrhea.  Genitourinary: Negative for frequency and hematuria.  Musculoskeletal: Negative for back pain.  Skin: Negative for rash.  Neurological: Negative for seizures and headaches.  Psychiatric/Behavioral: Negative for hallucinations.      Allergies  Penicillins; Ibuprofen; and Aspirin  Home Medications   Prior to Admission medications   Medication Sig Start Date End Date Taking? Authorizing Provider  albuterol (PROVENTIL HFA;VENTOLIN HFA) 108 (90 BASE) MCG/ACT inhaler Inhale 2 puffs into the lungs every 6 (six) hours as needed. For shortness of breath   Yes Historical Provider, MD  ALPRAZolam Prudy Feeler) 1 MG tablet Take 2 mg by mouth 4 (four) times daily.    Yes Historical Provider, MD  citalopram (CELEXA) 20 MG tablet Take 20 mg by mouth daily.    Yes Historical Provider, MD  lisinopril-hydrochlorothiazide (PRINZIDE,ZESTORETIC) 20-25 MG per tablet Take 1 tablet by mouth daily.    Yes Historical Provider, MD  meloxicam (MOBIC) 15 MG tablet Take 15 mg by mouth daily.    Yes Historical Provider, MD  methocarbamol (ROBAXIN) 750 MG tablet Take 750 mg by mouth 4 (four) times daily.     Yes Historical Provider, MD  pantoprazole (PROTONIX) 40 MG tablet Take 40 mg by  mouth 2 (two) times daily.    Yes Historical Provider, MD  potassium chloride (K-DUR) 10 MEQ tablet Take 10 mEq by mouth daily.   Yes Historical Provider, MD  pregabalin (LYRICA) 100 MG capsule Take 100 mg by mouth 3 (three) times daily.   Yes Historical Provider, MD  sucralfate (CARAFATE) 1 G tablet Take 1 g by mouth 2 (two) times daily. 02/21/13  Yes Donnetta Hutching, MD  tapentadol (NUCYNTA) 50 MG TABS tablet Take 50 mg by mouth every 6 (six) hours.   Yes Historical Provider, MD  temazepam (RESTORIL) 30 MG capsule Take 60 mg by mouth at bedtime. For sleep   Yes Historical Provider, MD  HYDROcodone-acetaminophen (NORCO/VICODIN) 5-325 MG per tablet Take 1 tablet by mouth every  6 (six) hours as needed. 09/05/13   [name], MD  sucralfate (CARAFATE) 1 G tablet Take 1 tablet (1 g total) by mouth 4 (four) times daily -  with meals and at bedtime. 09/05/13   [name], MD   BP 109/66  Pulse 55  Temp(Src) 98.4 F (36.9 C) (Oral)  Resp 20  Ht  (1.575 m)  Wt 190 lb (86.183 kg)  BMI 34.74 kg/m2  SpO2 98%  LMP 08/31/2013 Physical Exam  Constitutional: She is oriented to person, place, and time. She appears well-developed.  HENT:  Head: Normocephalic.  Eyes: Conjunctivae and EOM are normal. No scleral icterus.  Neck: Neck supple. No thyromegaly present.  Cardiovascular: Normal rate and regular rhythm.  Exam reveals no gallop and no friction rub.   No murmur heard. Pulmonary/Chest: No stridor. She has no wheezes. She has no rales. She exhibits no tenderness.  Abdominal: She exhibits no distension. There is tenderness. There is no rebound.  Tender epigastric  Musculoskeletal: Normal range of motion. She exhibits no edema.  Lymphadenopathy:    She has no cervical adenopathy.  Neurological: She is oriented to person, place, and time. She exhibits normal muscle tone. Coordination normal.  Skin: No rash noted. No erythema.  Psychiatric: She has a normal mood and affect. Her behavior is normal.    ED Course  Procedures (including critical care time) Labs Review Labs Reviewed  CBC WITH DIFFERENTIAL - Abnormal; Notable for the following:    WBC 12.4 (*)    Neutro Abs 8.7 (*)    All other components within normal limits  COMPREHENSIVE METABOLIC PANEL - Abnormal; Notable for the following:    Creatinine, Ser 1.36 (*)    Alkaline Phosphatase 193 (*)    GFR calc non Af Amer 47 (*)    GFR calc Af Amer 55 (*)    All other components within normal limits  LIPASE, BLOOD    Imaging Review Dg Abd Acute W/chest  09/05/2013   CLINICAL DATA:  Mid abdominal pain.  Loss of appetite for 1 week.  EXAM: ACUTE ABDOMEN SERIES (ABDOMEN 2 VIEW & CHEST 1 VIEW)   COMPARISON:  02/21/2013  FINDINGS: Normal heart size and pulmonary vascularity. Lungs appear clear and expanded. No blunting of costophrenic angles. No pneumothorax. Postoperative changes in the cervical spine.  Scattered gas and stool in the colon. No small or large bowel distention. No free intra-abdominal air. No abnormal air-fluid levels. No radiopaque stones. Surgical clips in the right upper quadrant. Postoperative changes in the lumbar spine.  IMPRESSION: No evidence of active pulmonary disease. Nonobstructive bowel gas pattern.   Electronically Signed   By: Burman Nieves M.D.   On: 09/05/2013 21:20  EKG Interpretation None      MDM   Final diagnoses:  Abdominal pain in female   abd pain.   Pud,  Start carafate and follow up with pcp   The chart was scribed for me under my direct supervision.  I personally performed the history, physical, and medical decision making and all procedures in the evaluation of this patient.[name], MD 09/05/13 [phone]

## 2013-09-05 NOTE — Discharge Instructions (Signed)
Follow up with your md this week if not improving.  Otherwise follow up next week.

## 2013-09-05 NOTE — ED Notes (Signed)
Pt reports stomach pain in epigastric region for 1 week. Pt reports nausea and dry heaves and cannot tolerate food.  Pt reports chills, fever, and sweating. Pt reports gallbladder being taken out, and has complied with diet regimen.

## 2013-09-08 ENCOUNTER — Encounter (HOSPITAL_COMMUNITY): Payer: Self-pay | Admitting: Emergency Medicine

## 2013-09-08 ENCOUNTER — Emergency Department (HOSPITAL_COMMUNITY)
Admission: EM | Admit: 2013-09-08 | Discharge: 2013-09-08 | Disposition: A | Discharge status: Home / Self Care | Payer: Medicaid Other | Attending: Emergency Medicine | Admitting: Emergency Medicine

## 2013-09-08 ENCOUNTER — Emergency Department (HOSPITAL_COMMUNITY): Payer: Medicaid Other

## 2013-09-08 DIAGNOSIS — Z79899 Other long term (current) drug therapy: Secondary | ICD-10-CM | POA: Diagnosis not present

## 2013-09-08 DIAGNOSIS — R112 Nausea with vomiting, unspecified: Secondary | ICD-10-CM | POA: Diagnosis not present

## 2013-09-08 DIAGNOSIS — F17200 Nicotine dependence, unspecified, uncomplicated: Secondary | ICD-10-CM | POA: Diagnosis not present

## 2013-09-08 DIAGNOSIS — F3289 Other specified depressive episodes: Secondary | ICD-10-CM | POA: Diagnosis not present

## 2013-09-08 DIAGNOSIS — D72829 Elevated white blood cell count, unspecified: Secondary | ICD-10-CM | POA: Diagnosis not present

## 2013-09-08 DIAGNOSIS — F410 Panic disorder [episodic paroxysmal anxiety] without agoraphobia: Secondary | ICD-10-CM | POA: Insufficient documentation

## 2013-09-08 DIAGNOSIS — R51 Headache: Secondary | ICD-10-CM | POA: Diagnosis not present

## 2013-09-08 DIAGNOSIS — R109 Unspecified abdominal pain: Secondary | ICD-10-CM | POA: Diagnosis present

## 2013-09-08 DIAGNOSIS — F329 Major depressive disorder, single episode, unspecified: Secondary | ICD-10-CM | POA: Insufficient documentation

## 2013-09-08 DIAGNOSIS — Z8709 Personal history of other diseases of the respiratory system: Secondary | ICD-10-CM | POA: Diagnosis not present

## 2013-09-08 DIAGNOSIS — I10 Essential (primary) hypertension: Secondary | ICD-10-CM | POA: Insufficient documentation

## 2013-09-08 DIAGNOSIS — Z9851 Tubal ligation status: Secondary | ICD-10-CM | POA: Diagnosis not present

## 2013-09-08 DIAGNOSIS — R1013 Epigastric pain: Secondary | ICD-10-CM | POA: Insufficient documentation

## 2013-09-08 DIAGNOSIS — Z872 Personal history of diseases of the skin and subcutaneous tissue: Secondary | ICD-10-CM | POA: Insufficient documentation

## 2013-09-08 DIAGNOSIS — R630 Anorexia: Secondary | ICD-10-CM | POA: Insufficient documentation

## 2013-09-08 DIAGNOSIS — Z791 Long term (current) use of non-steroidal anti-inflammatories (NSAID): Secondary | ICD-10-CM | POA: Insufficient documentation

## 2013-09-08 DIAGNOSIS — Z8739 Personal history of other diseases of the musculoskeletal system and connective tissue: Secondary | ICD-10-CM | POA: Diagnosis not present

## 2013-09-08 DIAGNOSIS — Z9089 Acquired absence of other organs: Secondary | ICD-10-CM | POA: Insufficient documentation

## 2013-09-08 DIAGNOSIS — G8929 Other chronic pain: Secondary | ICD-10-CM | POA: Diagnosis not present

## 2013-09-08 DIAGNOSIS — R195 Other fecal abnormalities: Secondary | ICD-10-CM | POA: Diagnosis not present

## 2013-09-08 LAB — CBC WITH DIFFERENTIAL/PLATELET
Basophils Absolute: 0 10*3/uL (ref 0.0–0.1)
Basophils Relative: 0 % (ref 0–1)
EOS PCT: 1 % (ref 0–5)
Eosinophils Absolute: 0.2 10*3/uL (ref 0.0–0.7)
HCT: 46.5 % — ABNORMAL HIGH (ref 36.0–46.0)
Hemoglobin: 16.5 g/dL — ABNORMAL HIGH (ref 12.0–15.0)
LYMPHS ABS: 3.6 10*3/uL (ref 0.7–4.0)
LYMPHS PCT: 21 % (ref 12–46)
MCH: 33.1 pg (ref 26.0–34.0)
MCHC: 35.5 g/dL (ref 30.0–36.0)
MCV: 93.2 fL (ref 78.0–100.0)
Monocytes Absolute: 1.1 10*3/uL — ABNORMAL HIGH (ref 0.1–1.0)
Monocytes Relative: 7 % (ref 3–12)
Neutro Abs: 12.4 10*3/uL — ABNORMAL HIGH (ref 1.7–7.7)
Neutrophils Relative %: 71 % (ref 43–77)
Platelets: 290 10*3/uL (ref 150–400)
RBC: 4.99 MIL/uL (ref 3.87–5.11)
RDW: 13.4 % (ref 11.5–15.5)
WBC: 17.4 10*3/uL — ABNORMAL HIGH (ref 4.0–10.5)

## 2013-09-08 LAB — COMPREHENSIVE METABOLIC PANEL
ALK PHOS: 213 U/L — AB (ref 39–117)
ALT: 12 U/L (ref 0–35)
AST: 13 U/L (ref 0–37)
Albumin: 4.2 g/dL (ref 3.5–5.2)
Anion gap: 16 — ABNORMAL HIGH (ref 5–15)
BILIRUBIN TOTAL: 0.6 mg/dL (ref 0.3–1.2)
BUN: 13 mg/dL (ref 6–23)
CHLORIDE: 100 meq/L (ref 96–112)
CO2: 24 meq/L (ref 19–32)
Calcium: 9.5 mg/dL (ref 8.4–10.5)
Creatinine, Ser: 1.21 mg/dL — ABNORMAL HIGH (ref 0.50–1.10)
GFR calc Af Amer: 63 mL/min — ABNORMAL LOW (ref >90)
GFR calc non Af Amer: 54 mL/min — ABNORMAL LOW (ref >90)
Glucose, Bld: 116 mg/dL — ABNORMAL HIGH (ref 70–99)
Potassium: 3.4 mEq/L — ABNORMAL LOW (ref 3.7–5.3)
Sodium: 140 mEq/L (ref 137–147)
Total Protein: 8.5 g/dL — ABNORMAL HIGH (ref 6.0–8.3)

## 2013-09-08 LAB — URINALYSIS, ROUTINE W REFLEX MICROSCOPIC
BILIRUBIN URINE: NEGATIVE
GLUCOSE, UA: NEGATIVE mg/dL
HGB URINE DIPSTICK: NEGATIVE
KETONES UR: NEGATIVE mg/dL
Leukocytes, UA: NEGATIVE
Nitrite: NEGATIVE
PROTEIN: NEGATIVE mg/dL
Specific Gravity, Urine: <1.005 — ABNORMAL LOW (ref 1.005–1.030)
Urobilinogen, UA: 0.2 mg/dL (ref 0.0–1.0)
pH: 5 (ref 5.0–8.0)

## 2013-09-08 LAB — LIPASE, BLOOD: LIPASE: 57 U/L (ref 11–59)

## 2013-09-08 MED ORDER — IOHEXOL 300 MG/ML  SOLN
100.0000 mL | Freq: Once | INTRAMUSCULAR | Status: AC | PRN
  Administered 2013-09-08: 100 mL via INTRAVENOUS

## 2013-09-08 MED ORDER — HYDROMORPHONE HCL PF 1 MG/ML IJ SOLN
1.0000 mg | Freq: Once | INTRAMUSCULAR | Status: AC
  Administered 2013-09-08: 1 mg via INTRAVENOUS
  Filled 2013-09-08: qty 1

## 2013-09-08 MED ORDER — SODIUM CHLORIDE 0.9 % IJ SOLN
INTRAMUSCULAR | Status: AC
  Filled 2013-09-08: qty 500

## 2013-09-08 MED ORDER — ONDANSETRON 4 MG PO TBDP: ORAL_TABLET | ORAL | Status: DC

## 2013-09-08 MED ORDER — GI COCKTAIL ~~LOC~~
30.0000 mL | Freq: Once | ORAL | Status: AC
  Administered 2013-09-08: 30 mL via ORAL
  Filled 2013-09-08: qty 30

## 2013-09-08 MED ORDER — SODIUM CHLORIDE 0.9 % IV BOLUS (SEPSIS)
1000.0000 mL | Freq: Once | INTRAVENOUS | Status: AC
  Administered 2013-09-08: 1000 mL via INTRAVENOUS

## 2013-09-08 MED ORDER — SODIUM CHLORIDE 0.9 % IJ SOLN
INTRAMUSCULAR | Status: AC
  Filled 2013-09-08: qty 60

## 2013-09-08 MED ORDER — IOHEXOL 300 MG/ML  SOLN
50.0000 mL | Freq: Once | INTRAMUSCULAR | Status: AC | PRN
  Administered 2013-09-08: 50 mL via ORAL

## 2013-09-08 MED ORDER — ONDANSETRON HCL 4 MG/2ML IJ SOLN
4.0000 mg | Freq: Once | INTRAMUSCULAR | Status: AC
  Administered 2013-09-08: 4 mg via INTRAVENOUS
  Filled 2013-09-08: qty 2

## 2013-09-08 NOTE — ED Provider Notes (Addendum)
CSN: [id number]     Arrival date & time 09/08/13  1413 History   This chart was scribed for Enid Skeens, MD by Tonye Royalty, ED Scribe. This patient was seen in room APA02/APA02 and the patient's care was started at 2:53 PM.    Chief Complaint  Patient presents with  . Abdominal Pain   The history is provided by the patient. No language interpreter was used.   HPI Comments: [name] is a 43 y.o. female who presents to the Emergency Department complaining of upper abdominal pain with onset 1 week ago. She states she came to this ED 3 days ago and was prescribed Hydrocodone, which did not achieve remission of symptoms. She reports similar pain in the past and previous diagnosis of pancreatic disease. She denies alcohol use. Patient reports associated diarrhea, dark stool, vomiting, leg ramping, loss of appetite. And headache. She denies leg swelling vision changes, or hematemesis.   Past Medical History  Diagnosis Date  . Scoliosis   . Hypertension   . Panic attack   . Back pain   . Depression   . Anxiety   . Ulcer   . Chronic back pain 02/04/13    By patient report  . Laryngitis, chronic    Past Surgical History  Procedure Laterality Date  . Back surgery    . Cesarean section    . Neck surgery    . Tubal ligation    . Cholecystectomy  11/27/2011    Procedure: LAPAROSCOPIC CHOLECYSTECTOMY;  Surgeon: Fabio Bering, MD;  Location: [facility];  Service: General;  Laterality: N/A;   Family History  Problem Relation Age of Onset  . Heart disease    . Arthritis    . Lung disease    . Cancer    . Asthma    . Diabetes     History  Substance Use Topics  . Smoking status: Current Every Day Smoker -- 2.00 packs/day for 25 years    Types: Cigarettes  . Smokeless tobacco: Never Used  . Alcohol Use: No   OB History   Grav Para Term Preterm Abortions TAB SAB Ect Mult Living   4 3 3  1     3      Review of Systems  Eyes: Negative for visual disturbance.  Cardiovascular:  Negative for leg swelling.  Gastrointestinal: Positive for nausea, vomiting, abdominal pain and diarrhea.       Dark stool, loss of appetite, no hematemesis  Musculoskeletal:       Leg cramping  Neurological: Positive for headaches.  All other systems reviewed and are negative.  Allergies  Penicillins; Ibuprofen; and Aspirin  Home Medications   Prior to Admission medications   Medication Sig Start Date End Date Taking? Authorizing Provider  albuterol (PROVENTIL HFA;VENTOLIN HFA) 108 (90 BASE) MCG/ACT inhaler Inhale 2 puffs into the lungs every 6 (six) hours as needed. For shortness of breath   Yes Historical Provider, MD  ALPRAZolam Prudy Feeler) 1 MG tablet Take 2 mg by mouth 4 (four) times daily.    Yes Historical Provider, MD  citalopram (CELEXA) 20 MG tablet Take 20 mg by mouth daily.    Yes Historical Provider, MD  HYDROcodone-acetaminophen (NORCO/VICODIN) 5-325 MG per tablet Take 1 tablet by mouth every 6 (six) hours as needed (pain).   Yes Historical Provider, MD  lisinopril-hydrochlorothiazide (PRINZIDE,ZESTORETIC) 20-25 MG per tablet Take 1 tablet by mouth daily.    Yes Historical Provider, MD  meloxicam (MOBIC) 15 MG tablet  Take 15 mg by mouth daily.    Yes Historical Provider, MD  methocarbamol (ROBAXIN) 750 MG tablet Take 750 mg by mouth 4 (four) times daily.     Yes Historical Provider, MD  pantoprazole (PROTONIX) 40 MG tablet Take 40 mg by mouth 2 (two) times daily.    Yes Historical Provider, MD  potassium chloride (K-DUR) 10 MEQ tablet Take 10 mEq by mouth daily.   Yes Historical Provider, MD  pregabalin (LYRICA) 100 MG capsule Take 100 mg by mouth 3 (three) times daily.   Yes Historical Provider, MD  sucralfate (CARAFATE) 1 G tablet Take 1 tablet (1 g total) by mouth 4 (four) times daily -  with meals and at bedtime. 09/05/13  Yes [name], MD  tapentadol (NUCYNTA) 50 MG TABS tablet Take 50 mg by mouth every 6 (six) hours.   Yes Historical Provider, MD  temazepam  (RESTORIL) 30 MG capsule Take 60 mg by mouth at bedtime. For sleep   Yes Historical Provider, MD   BP 118/95  Pulse 98  Temp(Src) 98.1 F (36.7 C) (Oral)  Resp 18  Ht  (1.575 m)  Wt 190 lb (86.183 kg)  BMI 34.74 kg/m2  SpO2 100%  LMP 08/31/2013 Physical Exam  Nursing note and vitals reviewed. Constitutional: She is oriented to person, place, and time. She appears well-developed and well-nourished.  HENT:  Head: Normocephalic and atraumatic.  Eyes: Conjunctivae are normal.  Neck: Normal range of motion and phonation normal. Neck supple.  Cardiovascular: Normal rate, regular rhythm, normal heart sounds and intact distal pulses.   Pulmonary/Chest: Effort normal and breath sounds normal. She exhibits no tenderness.  Abdominal: Soft. She exhibits no distension. There is tenderness (upper abd). There is no guarding.  Musculoskeletal: Normal range of motion.  Neurological: She is alert and oriented to person, place, and time. She exhibits normal muscle tone.  Skin: Skin is warm and dry.  Psychiatric: She has a normal mood and affect. Her behavior is normal. Judgment and thought content normal.    ED Course  Procedures (including critical care time) Labs Review Labs Reviewed  CBC WITH DIFFERENTIAL - Abnormal; Notable for the following:    WBC 17.4 (*)    Hemoglobin 16.5 (*)    HCT 46.5 (*)    Neutro Abs 12.4 (*)    Monocytes Absolute 1.1 (*)    All other components within normal limits  COMPREHENSIVE METABOLIC PANEL - Abnormal; Notable for the following:    Potassium 3.4 (*)    Glucose, Bld 116 (*)    Creatinine, Ser 1.21 (*)    Total Protein 8.5 (*)    Alkaline Phosphatase 213 (*)    GFR calc non Af Amer 54 (*)    GFR calc Af Amer 63 (*)    Anion gap 16 (*)    All other components within normal limits  URINALYSIS, ROUTINE W REFLEX MICROSCOPIC - Abnormal; Notable for the following:    Color, Urine STRAW (*)    APPearance HAZY (*)    Specific Gravity, Urine <1.005 (*)     All other components within normal limits  LIPASE, BLOOD    Imaging Review Ct Abdomen Pelvis W Contrast  09/08/2013   CLINICAL DATA:  43 year old female with abdominal pain. Reported history of pancreatitis.  EXAM: CT ABDOMEN AND PELVIS WITH CONTRAST  TECHNIQUE: Multidetector CT imaging of the abdomen and pelvis was performed using the standard protocol following bolus administration of intravenous contrast.  CONTRAST:  100 cc  intravenous Omnipaque 300  COMPARISON:  02/21/2013 and prior CTs  FINDINGS: Subsegmental right basilar atelectasis identified.  The left liver is atrophic. Mild hepatosplenomegaly again noted. No focal hepatic or splenic lesions are identified.  The adrenal glands, kidneys and pancreas are unremarkable.  There is no evidence of biliary dilatation, abdominal aortic aneurysm or free fluid.  Upper limits of normal periportal lymph nodes are unchanged.  The bowel, bladder and appendix are unremarkable. There is no evidence of bowel obstruction, abscess or pneumoperitoneum.  The uterus and adnexal regions are unremarkable.  No acute or suspicious bony abnormalities are identified.  IMPRESSION: No evidence of acute abnormality within the abdomen or pelvis.  Mild hepatosplenomegaly again noted.  Subsegmental right basilar atelectasis.   Electronically Signed   By: Laveda Abbe M.D.   On: 09/08/2013 16:21     EKG Interpretation None      DIAGNOSTIC STUDIES: Oxygen Saturation is 100% on room air, normal by my interpretation.    COORDINATION OF CARE:   MDM   Final diagnoses:  Epigastric pain  Leukocytosis  Non-intractable vomiting with nausea, vomiting of unspecified type   I personally performed the services described in this documentation, which was scribed in my presence. The recorded information has been reviewed and is accurate.  Patient with worsening epigastric pain since Monday. Patient has history of ulcers and currently on sucralfate and protonix. Pain improved  significantly on examination no GI bleeding in the ER. CT abdomen pelvis ordered due to worsening pain and leukocytosis, no acute abnormality found. Clinically likely gastric ulcer as previous. Discussed close followup with gastroenterology and reasons to return.  Results and differential diagnosis were discussed with the patient/parent/guardian. Close follow up outpatient was discussed, comfortable with the plan.   Medications  sodium chloride 0.9 % injection (not administered)  sodium chloride 0.9 % injection (not administered)  HYDROmorphone (DILAUDID) injection 1 mg (1 mg Intravenous Given 09/08/13 1523)  sodium chloride 0.9 % bolus 1,000 mL (0 mLs Intravenous Stopped 09/08/13 1625)  gi cocktail (Maalox,Lidocaine,Donnatal) (30 mLs Oral Given 09/08/13 1625)  ondansetron (ZOFRAN) injection 4 mg (4 mg Intravenous Given 09/08/13 1523)  iohexol (OMNIPAQUE) 300 MG/ML solution 50 mL (50 mLs Oral Contrast Given 09/08/13 1507)  iohexol (OMNIPAQUE) 300 MG/ML solution 100 mL (100 mLs Intravenous Contrast Given 09/08/13 1558)  HYDROmorphone (DILAUDID) injection 1 mg (1 mg Intravenous Given 09/08/13 1648)    Filed Vitals:   09/08/13 1425 09/08/13 1640  BP: 118/95 115/71  Pulse: 98 71  Temp: 98.1 F (36.7 C)   TempSrc: Oral   Resp: 18   Height:  (1.575 m)   Weight: 190 lb (86.183 kg)   SpO2: 100% 97%      Enid Skeens, MD 09/08/13 1728  Enid Skeens, MD 09/08/13 1728

## 2013-09-08 NOTE — ED Notes (Signed)
Pt states unable to provide urine specimen at this time. Pt currently attempting to drink contrast. Will given gi cocktail once pt is finished with  Po contrast.

## 2013-09-08 NOTE — Discharge Instructions (Signed)
If you were given medicines take as directed.  If you are on coumadin or contraceptives realize their levels and effectiveness is altered by many different medicines.  If you have any reaction (rash, tongues swelling, other) to the medicines stop taking and see a physician.   Please follow up as directed and return to the ER or see a physician for new or worsening symptoms.  Thank you. Filed Vitals:   09/08/13 1425 09/08/13 1640  BP: 118/95 115/71  Pulse: 98 71  Temp: 98.1 F (36.7 C)   TempSrc: Oral   Resp: 18   Height: 5\' 2"  (1.575 m)   Weight: 190 lb (86.183 kg)   SpO2: 100% 97%

## 2013-09-08 NOTE — ED Notes (Signed)
Upper abd pain, since Monday,  Seen here 8/25 for same,  No better.  Vomiting, "black stools".

## 2013-09-08 NOTE — ED Notes (Signed)
Pt states pain is coming back.  No relief with gi cocktail.

## 2013-11-13 ENCOUNTER — Encounter (HOSPITAL_COMMUNITY): Payer: Self-pay | Admitting: Emergency Medicine

## 2013-12-14 ENCOUNTER — Ambulatory Visit (INDEPENDENT_AMBULATORY_CARE_PROVIDER_SITE_OTHER): Payer: Medicaid Other | Admitting: Otolaryngology

## 2014-01-10 ENCOUNTER — Emergency Department (HOSPITAL_COMMUNITY)
Admission: EM | Admit: 2014-01-10 | Discharge: 2014-01-11 | Disposition: A | Discharge status: Home / Self Care | Payer: Medicaid Other | Attending: Emergency Medicine | Admitting: Emergency Medicine

## 2014-01-10 ENCOUNTER — Encounter (HOSPITAL_COMMUNITY): Payer: Self-pay | Admitting: Emergency Medicine

## 2014-01-10 DIAGNOSIS — Z880 Allergy status to penicillin: Secondary | ICD-10-CM | POA: Insufficient documentation

## 2014-01-10 DIAGNOSIS — R05 Cough: Secondary | ICD-10-CM | POA: Diagnosis not present

## 2014-01-10 DIAGNOSIS — Z791 Long term (current) use of non-steroidal anti-inflammatories (NSAID): Secondary | ICD-10-CM | POA: Insufficient documentation

## 2014-01-10 DIAGNOSIS — F329 Major depressive disorder, single episode, unspecified: Secondary | ICD-10-CM | POA: Diagnosis not present

## 2014-01-10 DIAGNOSIS — I10 Essential (primary) hypertension: Secondary | ICD-10-CM | POA: Diagnosis not present

## 2014-01-10 DIAGNOSIS — Z9049 Acquired absence of other specified parts of digestive tract: Secondary | ICD-10-CM | POA: Insufficient documentation

## 2014-01-10 DIAGNOSIS — Z9851 Tubal ligation status: Secondary | ICD-10-CM | POA: Insufficient documentation

## 2014-01-10 DIAGNOSIS — Z8739 Personal history of other diseases of the musculoskeletal system and connective tissue: Secondary | ICD-10-CM | POA: Diagnosis not present

## 2014-01-10 DIAGNOSIS — Z9889 Other specified postprocedural states: Secondary | ICD-10-CM | POA: Diagnosis not present

## 2014-01-10 DIAGNOSIS — Z3202 Encounter for pregnancy test, result negative: Secondary | ICD-10-CM | POA: Insufficient documentation

## 2014-01-10 DIAGNOSIS — R110 Nausea: Secondary | ICD-10-CM | POA: Diagnosis not present

## 2014-01-10 DIAGNOSIS — R1013 Epigastric pain: Secondary | ICD-10-CM | POA: Insufficient documentation

## 2014-01-10 DIAGNOSIS — F410 Panic disorder [episodic paroxysmal anxiety] without agoraphobia: Secondary | ICD-10-CM | POA: Insufficient documentation

## 2014-01-10 DIAGNOSIS — Z79899 Other long term (current) drug therapy: Secondary | ICD-10-CM | POA: Diagnosis not present

## 2014-01-10 DIAGNOSIS — G8929 Other chronic pain: Secondary | ICD-10-CM | POA: Insufficient documentation

## 2014-01-10 DIAGNOSIS — Z720 Tobacco use: Secondary | ICD-10-CM | POA: Diagnosis not present

## 2014-01-10 DIAGNOSIS — Z8709 Personal history of other diseases of the respiratory system: Secondary | ICD-10-CM | POA: Diagnosis not present

## 2014-01-10 LAB — URINALYSIS, ROUTINE W REFLEX MICROSCOPIC
Bilirubin Urine: NEGATIVE
Glucose, UA: NEGATIVE mg/dL
Hgb urine dipstick: NEGATIVE
KETONES UR: NEGATIVE mg/dL
Leukocytes, UA: NEGATIVE
Nitrite: NEGATIVE
PROTEIN: NEGATIVE mg/dL
Specific Gravity, Urine: 1.02 (ref 1.005–1.030)
Urobilinogen, UA: 0.2 mg/dL (ref 0.0–1.0)
pH: 6.5 (ref 5.0–8.0)

## 2014-01-10 LAB — POC URINE PREG, ED: Preg Test, Ur: NEGATIVE

## 2014-01-10 MED ORDER — ONDANSETRON 8 MG PO TBDP
8.0000 mg | ORAL_TABLET | Freq: Once | ORAL | Status: AC
  Administered 2014-01-10: 8 mg via ORAL
  Filled 2014-01-10: qty 1

## 2014-01-10 MED ORDER — GI COCKTAIL ~~LOC~~
30.0000 mL | Freq: Once | ORAL | Status: AC
  Administered 2014-01-10: 30 mL via ORAL
  Filled 2014-01-10: qty 30

## 2014-01-10 NOTE — ED Provider Notes (Signed)
CSN: [id number]     Arrival date & time 01/10/14  2122 History  This chart was scribed for Joya Gaskins, MD by Haywood Pao, ED Scribe. The patient was seen in APA03/APA03 and the patient's care was started at 11:32 PM.  Chief Complaint  Patient presents with  . Abdominal Pain   Patient is a 43 y.o. female presenting with abdominal pain. The history is provided by the patient. No language interpreter was used.  Abdominal Pain Pain location:  Epigastric Pain quality: sharp   Pain radiates to:  Does not radiate Pain severity:  Mild Timing:  Constant Chronicity:  Recurrent Associated symptoms: cough and nausea   Associated symptoms: no chest pain, no dysuria, no fatigue and no vomiting     HPI Comments: [name] is a 43 y.o. female who presents to the Emergency Department complaining of upper abdominal, epigastric pain, onset early this evening. Pain is described as non radiating and sharp. She has nausea and difficulty breathing as associated symptoms. Had a flare up 4-5 months ago of this complaint. She denies fever, vomiting, melena, dysuria, lower abdominal pain and CP.  She has taken Carafate for relief. Pt does not drink ETOH, she does smoke. Does not take ibuprofen. She has had a cholecystectomy. Pt has been sick with a cough for past several days.  She also reports recent diarrhea  Past Medical History  Diagnosis Date  . Scoliosis   . Hypertension   . Panic attack   . Back pain   . Depression   . Anxiety   . Ulcer   . Chronic back pain 02/04/13    By patient report  . Laryngitis, chronic    Past Surgical History  Procedure Laterality Date  . Back surgery    . Cesarean section    . Neck surgery    . Tubal ligation    . Cholecystectomy  11/27/2011    Procedure: LAPAROSCOPIC CHOLECYSTECTOMY;  Surgeon: Fabio Bering, MD;  Location: [facility];  Service: General;  Laterality: N/A;   Family History  Problem Relation Age of Onset  . Heart disease    .  Arthritis    . Lung disease    . Cancer    . Asthma    . Diabetes     History  Substance Use Topics  . Smoking status: Current Every Day Smoker -- 2.00 packs/day for 25 years    Types: Cigarettes  . Smokeless tobacco: Never Used  . Alcohol Use: No   OB History    Gravida Para Term Preterm AB TAB SAB Ectopic Multiple Living   4 3 3  1     3      Review of Systems  Constitutional: Negative for fatigue.  Respiratory: Positive for cough.   Cardiovascular: Negative for chest pain.  Gastrointestinal: Positive for nausea and abdominal pain. Negative for vomiting.  Genitourinary: Negative for dysuria.  All other systems reviewed and are negative.  Allergies  Penicillins; Ibuprofen; and Aspirin  Home Medications   Prior to Admission medications   Medication Sig Start Date End Date Taking? Authorizing Provider  ALPRAZolam Prudy Feeler) 1 MG tablet Take 2 mg by mouth 4 (four) times daily.    Yes Historical Provider, MD  celecoxib (CELEBREX) 100 MG capsule Take 100 mg by mouth daily.   Yes Historical Provider, MD  citalopram (CELEXA) 20 MG tablet Take 20 mg by mouth daily.    Yes Historical Provider, MD  lisinopril-hydrochlorothiazide (PRINZIDE,ZESTORETIC) 20-25 MG per  tablet Take 1 tablet by mouth daily.    Yes Historical Provider, MD  methocarbamol (ROBAXIN) 750 MG tablet Take 750 mg by mouth 4 (four) times daily.     Yes Historical Provider, MD  pantoprazole (PROTONIX) 40 MG tablet Take 40 mg by mouth 2 (two) times daily.    Yes Historical Provider, MD  potassium chloride (K-DUR) 10 MEQ tablet Take 10 mEq by mouth daily.   Yes Historical Provider, MD  pregabalin (LYRICA) 100 MG capsule Take 100 mg by mouth 3 (three) times daily.   Yes Historical Provider, MD  sucralfate (CARAFATE) 1 G tablet Take 1 tablet (1 g total) by mouth 4 (four) times daily -  with meals and at bedtime. 09/05/13  Yes [name], MD  Tapentadol HCl (NUCYNTA) 75 MG TABS Take 75 mg by mouth every 6 (six) hours.   Yes  Historical Provider, MD  temazepam (RESTORIL) 30 MG capsule Take 60 mg by mouth at bedtime. For sleep   Yes Historical Provider, MD  albuterol (PROVENTIL HFA;VENTOLIN HFA) 108 (90 BASE) MCG/ACT inhaler Inhale 2 puffs into the lungs every 6 (six) hours as needed. For shortness of breath    Historical Provider, MD   BP 103/42 mmHg  Pulse 83  Temp(Src) 98.6 F (37 C) (Oral)  Resp 20  Ht 5\' 2"  (1.575 m)  Wt 210 lb (95.255 kg)  BMI 38.40 kg/m2  SpO2 100%  LMP 12/26/2013 Physical Exam CONSTITUTIONAL: Well developed/well nourished HEAD: Normocephalic/atraumatic EYES: EOMI/PERRL, no scleral icterus ENMT: Mucous membranes moist NECK: supple no meningeal signs SPINE/BACK:entire spine nontender CV: S1/S2 noted, no murmurs/rubs/gallops noted LUNGS: Lungs are clear to auscultation bilaterally, no apparent distress ABDOMEN: soft, mild epigastric tenderness, no rebound or guarding, bowel sounds noted throughout abdomen GU:no cva tenderness NEURO: Pt is awake/alert/appropriate, moves all extremitiesx4.  No facial droop.   EXTREMITIES: pulses normal/equal, full ROM SKIN: warm, color normal PSYCH: no abnormalities of mood noted, alert and oriented to situation  ED Course  Procedures  DIAGNOSTIC STUDIES: Oxygen Saturation is 100% on room air, normal by my interpretation.    COORDINATION OF CARE: 11:39 PM Discussed treatment plan with pt at bedside and pt agreed to plan.   Pt well appearing, no distress, feels some improvement after PO meds She reports long h/o similar symptoms She has had multiple ED evaluations previously She is nontoxic in appearance.  I doubt acute abdominal/cardiac/vascular emergency at this time Discussed need for GI followup Advised to continue her protonix BP 98/56 mmHg  Pulse 67  Temp(Src) 98.6 F (37 C) (Oral)  Resp 20  Ht 5\' 2"  (1.575 m)  Wt 210 lb (95.255 kg)  BMI 38.40 kg/m2  SpO2 97%  LMP 12/26/2013  Labs Review Labs Reviewed  URINALYSIS, ROUTINE W  REFLEX MICROSCOPIC - Abnormal; Notable for the following:    APPearance CLOUDY (*)    All other components within normal limits  POC URINE PREG, ED   Medications  ondansetron (ZOFRAN-ODT) disintegrating tablet 8 mg (8 mg Oral Given 01/10/14 2347)  gi cocktail (Maalox,Lidocaine,Donnatal) (30 mLs Oral Given 01/10/14 2347)     EKG Interpretation  Date/Time:  Thursday January 11 2014 00:06:36 EST Ventricular Rate:  60 PR Interval:  158 QRS Duration: 104 QT Interval:  424 QTC Calculation: 424 R Axis:   40 Text Interpretation:  Normal sinus rhythm Normal ECG No significant change since last tracing Confirmed by Bebe Shaggy  MD, Zayra Devito ([id number]) on 01/11/2014 12:23:35 AM        MDM  Final diagnoses:  Epigastric pain    Nursing notes including past medical history and social history reviewed and considered in documentation Labs/vital reviewed myself and considered during evaluation Previous records reviewed and considered    I personally performed the services described in this documentation, which was scribed in my presence. The recorded information has been reviewed and is accurate.         Joya Gaskinsonald W Sagan Maselli, MD 01/11/14 [phone]

## 2014-01-10 NOTE — ED Notes (Signed)
Patient complaining of upper abdominal/epigastric pain. States started tonight. History of ulcers.

## 2014-01-11 ENCOUNTER — Other Ambulatory Visit: Payer: Self-pay

## 2014-01-11 NOTE — Discharge Instructions (Signed)

## 2014-04-30 ENCOUNTER — Encounter (HOSPITAL_COMMUNITY): Payer: Self-pay | Admitting: *Deleted

## 2014-04-30 ENCOUNTER — Emergency Department (HOSPITAL_COMMUNITY)
Admission: EM | Admit: 2014-04-30 | Discharge: 2014-04-30 | Disposition: A | Discharge status: Home / Self Care | Payer: Medicaid Other | Attending: Emergency Medicine | Admitting: Emergency Medicine

## 2014-04-30 DIAGNOSIS — Z872 Personal history of diseases of the skin and subcutaneous tissue: Secondary | ICD-10-CM | POA: Diagnosis not present

## 2014-04-30 DIAGNOSIS — I10 Essential (primary) hypertension: Secondary | ICD-10-CM | POA: Insufficient documentation

## 2014-04-30 DIAGNOSIS — J209 Acute bronchitis, unspecified: Secondary | ICD-10-CM | POA: Diagnosis not present

## 2014-04-30 DIAGNOSIS — Z8709 Personal history of other diseases of the respiratory system: Secondary | ICD-10-CM | POA: Insufficient documentation

## 2014-04-30 DIAGNOSIS — Z79899 Other long term (current) drug therapy: Secondary | ICD-10-CM | POA: Diagnosis not present

## 2014-04-30 DIAGNOSIS — Z8739 Personal history of other diseases of the musculoskeletal system and connective tissue: Secondary | ICD-10-CM | POA: Diagnosis not present

## 2014-04-30 DIAGNOSIS — F410 Panic disorder [episodic paroxysmal anxiety] without agoraphobia: Secondary | ICD-10-CM | POA: Diagnosis not present

## 2014-04-30 DIAGNOSIS — G8929 Other chronic pain: Secondary | ICD-10-CM | POA: Diagnosis not present

## 2014-04-30 DIAGNOSIS — F329 Major depressive disorder, single episode, unspecified: Secondary | ICD-10-CM | POA: Diagnosis not present

## 2014-04-30 DIAGNOSIS — Z880 Allergy status to penicillin: Secondary | ICD-10-CM | POA: Insufficient documentation

## 2014-04-30 DIAGNOSIS — R0981 Nasal congestion: Secondary | ICD-10-CM | POA: Diagnosis present

## 2014-04-30 MED ORDER — ALBUTEROL SULFATE (2.5 MG/3ML) 0.083% IN NEBU
5.0000 mg | INHALATION_SOLUTION | Freq: Once | RESPIRATORY_TRACT | Status: AC
  Administered 2014-04-30: 5 mg via RESPIRATORY_TRACT
  Filled 2014-04-30: qty 6

## 2014-04-30 MED ORDER — IPRATROPIUM-ALBUTEROL 0.5-2.5 (3) MG/3ML IN SOLN
3.0000 mL | Freq: Once | RESPIRATORY_TRACT | Status: AC
  Administered 2014-04-30: 3 mL via RESPIRATORY_TRACT
  Filled 2014-04-30: qty 3

## 2014-04-30 MED ORDER — PREDNISONE 50 MG PO TABS: ORAL_TABLET | ORAL | Status: DC

## 2014-04-30 MED ORDER — ALBUTEROL SULFATE (2.5 MG/3ML) 0.083% IN NEBU
INHALATION_SOLUTION | RESPIRATORY_TRACT | Status: AC
  Administered 2014-04-30: 5 mg
  Filled 2014-04-30: qty 6

## 2014-04-30 MED ORDER — PREDNISONE 50 MG PO TABS
60.0000 mg | ORAL_TABLET | Freq: Once | ORAL | Status: AC
  Administered 2014-04-30: 60 mg via ORAL
  Filled 2014-04-30 (×2): qty 1

## 2014-04-30 MED ORDER — OXYCODONE-ACETAMINOPHEN 5-325 MG PO TABS
1.0000 | ORAL_TABLET | Freq: Once | ORAL | Status: AC
  Administered 2014-04-30: 1 via ORAL
  Filled 2014-04-30: qty 1

## 2014-04-30 NOTE — ED Provider Notes (Signed)
CSN: [id number]     Arrival date & time 04/30/14  0029 History   First MD Initiated Contact with Patient 04/30/14 0045     Chief Complaint  Patient presents with  . Nasal Congestion  . Cough    Patient is a 44 y.o. [name] presenting with cough. The history is provided by the patient.  Cough Cough characteristics:  Productive Severity:  Moderate Onset quality:  Gradual Duration:  2 days Timing:  Intermittent Progression:  Worsening Chronicity:  New Smoker: yes   Relieved by:  Nothing Worsened by:  Nothing tried Associated symptoms: fever, headaches, shortness of breath and sinus congestion   Patient reports cough/congestion for past two days No hemoptysis She reports feeling SOB She also reports HA from coughing She is a smoker She reports having an inhaler at home but giving no relief  Past Medical History  Diagnosis Date  . Scoliosis   . Hypertension   . Panic attack   . Back pain   . Depression   . Anxiety   . Ulcer   . Chronic back pain 02/04/13    By patient report  . Laryngitis, chronic    Past Surgical History  Procedure Laterality Date  . Back surgery    . Cesarean section    . Neck surgery    . Tubal ligation    . Cholecystectomy  11/27/2011    Procedure: LAPAROSCOPIC CHOLECYSTECTOMY;  Surgeon: Fabio Bering, MD;  Location: [facility];  Service: General;  Laterality: N/A;   Family History  Problem Relation Age of Onset  . Heart disease    . Arthritis    . Lung disease    . Cancer    . Asthma    . Diabetes     History  Substance Use Topics  . Smoking status: Current Every Day Smoker -- 2.00 packs/day for 25 years    Types: Cigarettes  . Smokeless tobacco: Never Used  . Alcohol Use: No   OB History    Gravida Para Term Preterm AB TAB SAB Ectopic Multiple Living   Review of Systems  Constitutional: Positive for fever.       Temp at 100 per patient  Respiratory: Positive for cough and shortness of breath.   Cardiovascular:        "lungs sore"   Gastrointestinal: Positive for nausea.  Neurological: Positive for headaches.  All other systems reviewed and are negative.     Allergies  Penicillins; Ibuprofen; and Aspirin  Home Medications   Prior to Admission medications   Medication Sig Start Date End Date Taking? Authorizing Provider  albuterol (PROVENTIL HFA;VENTOLIN HFA) 108 (90 BASE) MCG/ACT inhaler Inhale 2 puffs into the lungs every 6 (six) hours as needed. For shortness of breath   Yes Historical Provider, MD  ALPRAZolam Prudy Feeler) 1 MG tablet Take 2 mg by mouth 4 (four) times daily.    Yes Historical Provider, MD  celecoxib (CELEBREX) 100 MG capsule Take 100 mg by mouth daily.   Yes Historical Provider, MD  citalopram (CELEXA) 20 MG tablet Take 20 mg by mouth daily.    Yes Historical Provider, MD  lisinopril-hydrochlorothiazide (PRINZIDE,ZESTORETIC) 20-25 MG per tablet Take 1 tablet by mouth daily.    Yes Historical Provider, MD  methocarbamol (ROBAXIN) 750 MG tablet Take 750 mg by mouth 4 (four) times daily.     Yes Historical Provider, MD  pantoprazole (PROTONIX) 40 MG tablet  Take 40 mg by mouth 2 (two) times daily.    Yes Historical Provider, MD  potassium chloride (K-DUR) 10 MEQ tablet Take 10 mEq by mouth daily.   Yes Historical Provider, MD  pregabalin (LYRICA) 100 MG capsule Take 100 mg by mouth 3 (three) times daily.   Yes Historical Provider, MD  sucralfate (CARAFATE) 1 G tablet Take 1 tablet (1 g total) by mouth 4 (four) times daily -  with meals and at bedtime. 09/05/13  Yes Bethann Berkshire, MD  Tapentadol HCl (NUCYNTA) 75 MG TABS Take 75 mg by mouth every 6 (six) hours.   Yes Historical Provider, MD   BP 116/54 mmHg  Pulse 84  Temp(Src) 98.1 F (36.7 C) (Oral)  Resp 24  Ht 5\' 2"  (1.575 m)  Wt 180 lb (81.647 kg)  BMI 32.91 kg/m2  SpO2 94%  LMP 04/24/2014 Physical Exam CONSTITUTIONAL: Well developed/well nourished HEAD: Normocephalic/atraumatic EYES: EOMI/PERRL ENMT: Mucous membranes  moist, uvula midline, no erythema/exudate noted NECK: supple no meningeal signs SPINE/BACK:entire spine nontender CV: S1/S2 noted, no murmurs/rubs/gallops noted LUNGS: decrease BS noted bilaterally.  Wheezing noted bilaterally ABDOMEN: soft, nontender, no rebound or guarding, bowel sounds noted throughout abdomen GU:no cva tenderness NEURO: Pt is awake/alert/appropriate, moves all extremitiesx4.  No facial droop.   EXTREMITIES: pulses normal/equal, full ROM SKIN: warm, color normal PSYCH: no abnormalities of mood noted, alert and oriented to situation  ED Course  Procedures  Medications  ipratropium-albuterol (DUONEB) 0.5-2.5 (3) MG/3ML nebulizer solution 3 mL (3 mLs Nebulization Given 04/30/14 0110)  predniSONE (DELTASONE) tablet Danielle mg (Danielle mg Oral Given 04/30/14 0058)  albuterol (PROVENTIL) (2.5 MG/3ML) 0.083% nebulizer solution (5 mg  Given 04/30/14 0119)  albuterol (PROVENTIL) (2.5 MG/3ML) 0.083% nebulizer solution 5 mg (5 mg Nebulization Given 04/30/14 0148)  oxyCODONE-acetaminophen (PERCOCET/ROXICET) 5-325 MG per tablet 1 tablet (1 tablet Oral Given 04/30/14 0156)  2:18 AM  pt improved Still with wheeze but improved air entry She is in no distress, resting comfortably Doubt pneumonia Advised to quit smoking She has albuterol at home We discussed strict return precautions Repeat pulse ox on my assessment was 95%   MDM   Final diagnoses:  Acute bronchitis, unspecified organism    Nursing notes including past medical history and social history reviewed and considered in documentation     Zadie Rhine, MD 04/30/14 [phone]

## 2014-04-30 NOTE — Discharge Instructions (Signed)

## 2014-04-30 NOTE — ED Notes (Signed)
Pt alert & oriented x4, stable gait. Patient given discharge instructions, paperwork & prescription(s). Patient  instructed to stop at the registration desk to finish any additional paperwork. Patient verbalized understanding. Pt left department w/ no further questions. 

## 2014-04-30 NOTE — ED Notes (Signed)
Pt reports sinus congestion & cough that started 2 days ago. Pt states feel SOB.

## 2014-05-01 ENCOUNTER — Emergency Department (HOSPITAL_COMMUNITY)
Admission: EM | Admit: 2014-05-01 | Discharge: 2014-05-01 | Discharge status: Against Medical Advice | Payer: Medicaid Other | Attending: Emergency Medicine | Admitting: Emergency Medicine

## 2014-05-01 ENCOUNTER — Emergency Department (HOSPITAL_COMMUNITY): Payer: Medicaid Other

## 2014-05-01 ENCOUNTER — Encounter (HOSPITAL_COMMUNITY): Payer: Self-pay | Admitting: Emergency Medicine

## 2014-05-01 DIAGNOSIS — Z79899 Other long term (current) drug therapy: Secondary | ICD-10-CM | POA: Insufficient documentation

## 2014-05-01 DIAGNOSIS — R509 Fever, unspecified: Secondary | ICD-10-CM | POA: Insufficient documentation

## 2014-05-01 DIAGNOSIS — G8929 Other chronic pain: Secondary | ICD-10-CM | POA: Diagnosis not present

## 2014-05-01 DIAGNOSIS — Z791 Long term (current) use of non-steroidal anti-inflammatories (NSAID): Secondary | ICD-10-CM | POA: Insufficient documentation

## 2014-05-01 DIAGNOSIS — R51 Headache: Secondary | ICD-10-CM | POA: Insufficient documentation

## 2014-05-01 DIAGNOSIS — R0981 Nasal congestion: Secondary | ICD-10-CM | POA: Insufficient documentation

## 2014-05-01 DIAGNOSIS — M419 Scoliosis, unspecified: Secondary | ICD-10-CM | POA: Diagnosis not present

## 2014-05-01 DIAGNOSIS — R0982 Postnasal drip: Secondary | ICD-10-CM | POA: Insufficient documentation

## 2014-05-01 DIAGNOSIS — F410 Panic disorder [episodic paroxysmal anxiety] without agoraphobia: Secondary | ICD-10-CM | POA: Diagnosis not present

## 2014-05-01 DIAGNOSIS — Z872 Personal history of diseases of the skin and subcutaneous tissue: Secondary | ICD-10-CM | POA: Insufficient documentation

## 2014-05-01 DIAGNOSIS — R05 Cough: Secondary | ICD-10-CM | POA: Diagnosis not present

## 2014-05-01 DIAGNOSIS — F329 Major depressive disorder, single episode, unspecified: Secondary | ICD-10-CM | POA: Diagnosis not present

## 2014-05-01 DIAGNOSIS — I10 Essential (primary) hypertension: Secondary | ICD-10-CM | POA: Diagnosis not present

## 2014-05-01 DIAGNOSIS — R0602 Shortness of breath: Secondary | ICD-10-CM | POA: Insufficient documentation

## 2014-05-01 DIAGNOSIS — Z7952 Long term (current) use of systemic steroids: Secondary | ICD-10-CM | POA: Insufficient documentation

## 2014-05-01 DIAGNOSIS — Z880 Allergy status to penicillin: Secondary | ICD-10-CM | POA: Insufficient documentation

## 2014-05-01 DIAGNOSIS — M791 Myalgia: Secondary | ICD-10-CM | POA: Insufficient documentation

## 2014-05-01 DIAGNOSIS — Z720 Tobacco use: Secondary | ICD-10-CM | POA: Insufficient documentation

## 2014-05-01 DIAGNOSIS — R062 Wheezing: Secondary | ICD-10-CM | POA: Insufficient documentation

## 2014-05-01 LAB — CBC WITH DIFFERENTIAL/PLATELET
BASOS ABS: 0 10*3/uL (ref 0.0–0.1)
BASOS PCT: 0 % (ref 0–1)
Eosinophils Absolute: 0 10*3/uL (ref 0.0–0.7)
Eosinophils Relative: 0 % (ref 0–5)
HEMATOCRIT: 40.5 % (ref 36.0–46.0)
HEMOGLOBIN: 14 g/dL (ref 12.0–15.0)
Lymphocytes Relative: 24 % (ref 12–46)
Lymphs Abs: 2.8 10*3/uL (ref 0.7–4.0)
MCH: 32.8 pg (ref 26.0–34.0)
MCHC: 34.6 g/dL (ref 30.0–36.0)
MCV: 94.8 fL (ref 78.0–100.0)
MONO ABS: 1.3 10*3/uL — AB (ref 0.1–1.0)
Monocytes Relative: 11 % (ref 3–12)
NEUTROS ABS: 7.6 10*3/uL (ref 1.7–7.7)
Neutrophils Relative %: 65 % (ref 43–77)
PLATELETS: 197 10*3/uL (ref 150–400)
RBC: 4.27 MIL/uL (ref 3.87–5.11)
RDW: 14.2 % (ref 11.5–15.5)
WBC: 11.7 10*3/uL — AB (ref 4.0–10.5)

## 2014-05-01 LAB — BASIC METABOLIC PANEL
Anion gap: 9 (ref 5–15)
BUN: 11 mg/dL (ref 6–23)
CALCIUM: 8.3 mg/dL — AB (ref 8.4–10.5)
CO2: 31 mmol/L (ref 19–32)
CREATININE: 0.9 mg/dL (ref 0.50–1.10)
Chloride: 101 mmol/L (ref 96–112)
GFR, EST AFRICAN AMERICAN: 89 mL/min — AB (ref >90)
GFR, EST NON AFRICAN AMERICAN: 77 mL/min — AB (ref >90)
Glucose, Bld: 106 mg/dL — ABNORMAL HIGH (ref 70–99)
Potassium: 2.8 mmol/L — ABNORMAL LOW (ref 3.5–5.1)
Sodium: 141 mmol/L (ref 135–145)

## 2014-05-01 LAB — TROPONIN I: Troponin I: <0.03 ng/mL (ref <0.031)

## 2014-05-01 MED ORDER — POTASSIUM CHLORIDE CRYS ER 20 MEQ PO TBCR
40.0000 meq | EXTENDED_RELEASE_TABLET | Freq: Once | ORAL | Status: AC
  Administered 2014-05-01: 40 meq via ORAL
  Filled 2014-05-01: qty 2

## 2014-05-01 MED ORDER — HYDROCOD POLST-CPM POLST ER 10-8 MG/5ML PO SUER
5.0000 mL | Freq: Once | ORAL | Status: AC
  Administered 2014-05-01: 5 mL via ORAL
  Filled 2014-05-01: qty 5

## 2014-05-01 MED ORDER — ACETAMINOPHEN 500 MG PO TABS
1000.0000 mg | ORAL_TABLET | Freq: Once | ORAL | Status: AC
  Administered 2014-05-01: 1000 mg via ORAL
  Filled 2014-05-01: qty 2

## 2014-05-01 MED ORDER — PREDNISONE 50 MG PO TABS
60.0000 mg | ORAL_TABLET | Freq: Once | ORAL | Status: AC
  Administered 2014-05-01: 60 mg via ORAL
  Filled 2014-05-01 (×2): qty 1

## 2014-05-01 MED ORDER — IPRATROPIUM-ALBUTEROL 0.5-2.5 (3) MG/3ML IN SOLN
3.0000 mL | Freq: Once | RESPIRATORY_TRACT | Status: AC
  Administered 2014-05-01: 3 mL via RESPIRATORY_TRACT
  Filled 2014-05-01: qty 3

## 2014-05-01 NOTE — ED Provider Notes (Signed)
CSN: [id number]     Arrival date & time 05/01/14  0048 History   First MD Initiated Contact with Patient 05/01/14 0056     Chief Complaint  Patient presents with  . Shortness of Breath     (Consider location/radiation/quality/duration/timing/severity/associated sxs/prior Treatment) HPI Comments: Patient is a 44 year old female who presents to the emergency department with a complaint of cough, and shortness of breath. The patient states that on Saturday, April 16, she began having problems with productive cough, fever, body aches, nasal congestion, and shortness of breath. She was seen in the emergency department during the early morning hours of April 18. She states that she seems to be getting worse, the sensation of difficulty with breathing seems to be getting worse, and the cough is more aggravating. She complains of her chest being sore, and the back of her throat also being of tender, sometimes burning. She reports some episodes of diarrhea earlier during the day today. She reports that on Saturday she had a maximum temperature 100. She has not measured a temperature elevation today. She states she has been having to use her inhaler almost every 1-2 hours. She presents now because she is very concerned about her breathing and is also concerned about her chest wall area pain.                                                                      Patient is a 44 y.o. female presenting with shortness of breath. The history is provided by the patient.  Shortness of Breath Associated symptoms: cough, fever and headaches     Past Medical History  Diagnosis Date  . Scoliosis   . Hypertension   . Panic attack   . Back pain   . Depression   . Anxiety   . Ulcer   . Chronic back pain 02/04/13    By patient report  . Laryngitis, chronic    Past Surgical History  Procedure Laterality Date  . Back surgery    . Cesarean section     . Neck surgery    . Tubal ligation    . Cholecystectomy  11/27/2011    Procedure: LAPAROSCOPIC CHOLECYSTECTOMY;  Surgeon: Fabio Bering, MD;  Location: [facility];  Service: General;  Laterality: N/A;   Family History  Problem Relation Age of Onset  . Heart disease    . Arthritis    . Lung disease    . Cancer    . Asthma    . Diabetes     History  Substance Use Topics  . Smoking status: Current Every Day Smoker -- 2.00 packs/day for 25 years    Types: Cigarettes  . Smokeless tobacco: Never Used  . Alcohol Use: No   OB History    Gravida Para Term Preterm AB TAB SAB Ectopic Multiple Living   Review of Systems  Constitutional: Positive for fever and fatigue.  HENT: Positive for postnasal drip and sinus pressure.   Respiratory: Positive for cough and shortness of breath.   Musculoskeletal: Positive for myalgias.  Neurological: Positive for headaches.  Psychiatric/Behavioral: The patient is nervous/anxious.   All other systems reviewed and are negative.  Allergies  Penicillins; Ibuprofen; and Aspirin  Home Medications   Prior to Admission medications   Medication Sig Start Date End Date Taking? Authorizing Provider  albuterol (PROVENTIL HFA;VENTOLIN HFA) 108 (90 BASE) MCG/ACT inhaler Inhale 2 puffs into the lungs every 6 (six) hours as needed. For shortness of breath    Historical Provider, MD  ALPRAZolam Prudy Feeler) 1 MG tablet Take 2 mg by mouth 4 (four) times daily.     Historical Provider, MD  celecoxib (CELEBREX) 100 MG capsule Take 100 mg by mouth daily.    Historical Provider, MD  citalopram (CELEXA) 20 MG tablet Take 20 mg by mouth daily.     Historical Provider, MD  lisinopril-hydrochlorothiazide (PRINZIDE,ZESTORETIC) 20-25 MG per tablet Take 1 tablet by mouth daily.     Historical Provider, MD  methocarbamol (ROBAXIN) 750 MG tablet Take 750 mg by mouth 4 (four) times daily.      Historical Provider, MD  pantoprazole (PROTONIX) 40 MG tablet  Take 40 mg by mouth 2 (two) times daily.     Historical Provider, MD  potassium chloride (K-DUR) 10 MEQ tablet Take 10 mEq by mouth daily.    Historical Provider, MD  predniSONE (DELTASONE) 50 MG tablet One tablet PO daily for 4 days 04/30/14   Zadie Rhine, MD  pregabalin (LYRICA) 100 MG capsule Take 100 mg by mouth 3 (three) times daily.    Historical Provider, MD  sucralfate (CARAFATE) 1 G tablet Take 1 tablet (1 g total) by mouth 4 (four) times daily -  with meals and at bedtime. 09/05/13   Bethann Berkshire, MD  Tapentadol HCl (NUCYNTA) 75 MG TABS Take 75 mg by mouth every 6 (six) hours.    Historical Provider, MD   BP 95/51 mmHg  Pulse 81  Temp(Src) 98.2 F (36.8 C) (Oral)  Resp 21  Ht 5\' 2"  (1.575 m)  Wt 180 lb (81.647 kg)  BMI 32.91 kg/m2  SpO2 98%  LMP 04/24/2014 Physical Exam  Constitutional: She is oriented to person, place, and time. She appears well-developed and well-nourished.  Non-toxic appearance.  HENT:  Head: Normocephalic.  Right Ear: Tympanic membrane and external ear normal.  Left Ear: Tympanic membrane and external ear normal.  Nasal congestion present.  Eyes: EOM and lids are normal. Pupils are equal, round, and reactive to light.  Neck: Normal range of motion. Neck supple. Carotid bruit is not present.  Cardiovascular: Normal rate, regular rhythm, normal heart sounds, intact distal pulses and normal pulses.   Pulmonary/Chest: Breath sounds normal. No respiratory distress.  A few areas of wheezing. Symmetrical rise and fall of the chest. Patient speaks in complete sentences. Few scattered rhonchi present.  Abdominal: Soft. Bowel sounds are normal. There is no tenderness. There is no guarding.  Musculoskeletal: Normal range of motion.  No edema appreciated. Negative Homans sign.  Lymphadenopathy:       Head (right side): No submandibular adenopathy present.       Head (left side): No submandibular adenopathy present.    She has no cervical adenopathy.   Neurological: She is alert and oriented to person, place, and time. She has normal strength. No cranial nerve deficit or sensory deficit.  Skin: Skin is warm and dry.  Psychiatric: Her speech is normal. Her mood appears anxious.  Nursing note and vitals reviewed.   ED Course  Procedures (including critical care time) Labs Review Labs Reviewed  BASIC METABOLIC PANEL - Abnormal; Notable for the following:    Potassium 2.8 (*)  Glucose, Bld 106 (*)    Calcium 8.3 (*)    GFR calc non Af Amer 77 (*)    GFR calc Af Amer 89 (*)    All other components within normal limits  CBC WITH DIFFERENTIAL/PLATELET - Abnormal; Notable for the following:    WBC 11.7 (*)    Monocytes Absolute 1.3 (*)    All other components within normal limits  TROPONIN I    Imaging Review No results found.   EKG Interpretation None      M  The potassium is low at 2.8, no elevation in the anion gap.  Of the complete blood count the white blood cell count has improved from previous evaluations at 11.7. Troponin is less than 0.03.   Chest xray is pending. Pt's care to be continued by Dr Wilkie Aye 2:35AM.   Final diagnoses:  None    **I have reviewed nursing notes, vital signs, and all appropriate lab and imaging results for this patient.Ivery Quale, PA-C 05/02/14 1002  Shon Baton, MD 05/02/14 [phone]

## 2014-05-01 NOTE — ED Notes (Signed)
Pt c/o sob, cough and vomiting with body aches.

## 2014-05-01 NOTE — ED Notes (Signed)
Pt walked out of ED without signing ama paperwork.

## 2014-05-02 ENCOUNTER — Encounter (HOSPITAL_COMMUNITY): Payer: Self-pay

## 2014-05-02 ENCOUNTER — Emergency Department (HOSPITAL_COMMUNITY)
Admission: EM | Admit: 2014-05-02 | Discharge: 2014-05-03 | Disposition: A | Discharge status: Home / Self Care | Payer: Medicaid Other | Attending: Emergency Medicine | Admitting: Emergency Medicine

## 2014-05-02 DIAGNOSIS — F329 Major depressive disorder, single episode, unspecified: Secondary | ICD-10-CM | POA: Diagnosis not present

## 2014-05-02 DIAGNOSIS — Z7952 Long term (current) use of systemic steroids: Secondary | ICD-10-CM | POA: Diagnosis not present

## 2014-05-02 DIAGNOSIS — Z872 Personal history of diseases of the skin and subcutaneous tissue: Secondary | ICD-10-CM | POA: Insufficient documentation

## 2014-05-02 DIAGNOSIS — Z79899 Other long term (current) drug therapy: Secondary | ICD-10-CM | POA: Diagnosis not present

## 2014-05-02 DIAGNOSIS — F410 Panic disorder [episodic paroxysmal anxiety] without agoraphobia: Secondary | ICD-10-CM | POA: Diagnosis not present

## 2014-05-02 DIAGNOSIS — Z720 Tobacco use: Secondary | ICD-10-CM | POA: Insufficient documentation

## 2014-05-02 DIAGNOSIS — I10 Essential (primary) hypertension: Secondary | ICD-10-CM | POA: Diagnosis not present

## 2014-05-02 DIAGNOSIS — J069 Acute upper respiratory infection, unspecified: Secondary | ICD-10-CM

## 2014-05-02 DIAGNOSIS — G8929 Other chronic pain: Secondary | ICD-10-CM | POA: Insufficient documentation

## 2014-05-02 DIAGNOSIS — R079 Chest pain, unspecified: Secondary | ICD-10-CM | POA: Diagnosis present

## 2014-05-02 NOTE — ED Notes (Signed)
Pt reports cough for several days, states she was given steroid prescription but wasn't able to get it filled.

## 2014-05-03 MED ORDER — IPRATROPIUM-ALBUTEROL 0.5-2.5 (3) MG/3ML IN SOLN
3.0000 mL | Freq: Once | RESPIRATORY_TRACT | Status: AC
  Administered 2014-05-03: 3 mL via RESPIRATORY_TRACT
  Filled 2014-05-03: qty 3

## 2014-05-03 MED ORDER — AZITHROMYCIN 250 MG PO TABS: 250.0000 mg | ORAL_TABLET | Freq: Every day | ORAL | Status: DC

## 2014-05-03 MED ORDER — AZITHROMYCIN 250 MG PO TABS
500.0000 mg | ORAL_TABLET | Freq: Once | ORAL | Status: AC
  Administered 2014-05-03: 500 mg via ORAL
  Filled 2014-05-03: qty 2

## 2014-05-03 MED ORDER — ACETAMINOPHEN 500 MG PO TABS
1000.0000 mg | ORAL_TABLET | Freq: Once | ORAL | Status: AC
  Administered 2014-05-03: 1000 mg via ORAL
  Filled 2014-05-03: qty 2

## 2014-05-03 MED ORDER — PREDNISONE 10 MG PO TABS
60.0000 mg | ORAL_TABLET | Freq: Once | ORAL | Status: AC
  Administered 2014-05-03: 60 mg via ORAL
  Filled 2014-05-03 (×2): qty 1

## 2014-05-03 MED ORDER — GUAIFENESIN-CODEINE 100-10 MG/5ML PO SOLN: 10.0000 mL | Freq: Four times a day (QID) | ORAL | Status: DC | PRN

## 2014-05-03 MED ORDER — PREDNISONE 20 MG PO TABS: 60.0000 mg | ORAL_TABLET | Freq: Every day | ORAL | Status: DC

## 2014-05-03 MED ORDER — ALBUTEROL SULFATE HFA 108 (90 BASE) MCG/ACT IN AERS: 2.0000 | INHALATION_SPRAY | RESPIRATORY_TRACT | Status: DC | PRN

## 2014-05-03 MED ORDER — GUAIFENESIN-CODEINE 100-10 MG/5ML PO SOLN
10.0000 mL | Freq: Once | ORAL | Status: AC
  Administered 2014-05-03: 10 mL via ORAL
  Filled 2014-05-03: qty 10

## 2014-05-03 NOTE — ED Provider Notes (Signed)
TIME SEEN: 12:05 AM  CHIEF COMPLAINT: Cough, chest pain with coughing, shortness of breath  HPI: Patient is a 44 year old female with history of hypertension, COPD with continued tobacco use he does not wear oxygen at home who presents to the emergency department with one week of subjective fevers, chest pain with coughing, wheezing, shortness of breath, productive cough. Denies having influenza vaccination. Denies travel but has had sick contacts. Was seen in the emergency department on 05/01/14 and had a chest x-ray that showed atypical pneumonia versus viral infection. Was discharged on steroids which she has not started taking. No vomiting or diarrhea. No rash.  ROS: See HPI Constitutional:  fever  Eyes: no drainage  ENT: no runny nose   Cardiovascular:  chest pain  Resp: SOB  GI: no vomiting GU: no dysuria Integumentary: no rash  Allergy: no hives  Musculoskeletal: no leg swelling  Neurological: no slurred speech ROS otherwise negative  PAST MEDICAL HISTORY/PAST SURGICAL HISTORY:  Past Medical History  Diagnosis Date  . Scoliosis   . Hypertension   . Panic attack   . Back pain   . Depression   . Anxiety   . Ulcer   . Chronic back pain 02/04/13    By patient report  . Laryngitis, chronic     MEDICATIONS:  Prior to Admission medications   Medication Sig Start Date End Date Taking? Authorizing Provider  albuterol (PROVENTIL HFA;VENTOLIN HFA) 108 (90 BASE) MCG/ACT inhaler Inhale 2 puffs into the lungs every 6 (six) hours as needed. For shortness of breath   Yes Historical Provider, MD  ALPRAZolam Prudy Feeler) 1 MG tablet Take 2 mg by mouth 4 (four) times daily.    Yes Historical Provider, MD  celecoxib (CELEBREX) 100 MG capsule Take 100 mg by mouth daily.   Yes Historical Provider, MD  citalopram (CELEXA) 20 MG tablet Take 20 mg by mouth daily.    Yes Historical Provider, MD  lisinopril-hydrochlorothiazide (PRINZIDE,ZESTORETIC) 20-25 MG per tablet Take 1 tablet by mouth daily.     Yes Historical Provider, MD  methocarbamol (ROBAXIN) 750 MG tablet Take 750 mg by mouth 4 (four) times daily.     Yes Historical Provider, MD  pantoprazole (PROTONIX) 40 MG tablet Take 40 mg by mouth 2 (two) times daily.    Yes Historical Provider, MD  potassium chloride (K-DUR) 10 MEQ tablet Take 10 mEq by mouth daily.   Yes Historical Provider, MD  pregabalin (LYRICA) 100 MG capsule Take 100 mg by mouth 3 (three) times daily.   Yes Historical Provider, MD  sucralfate (CARAFATE) 1 G tablet Take 1 tablet (1 g total) by mouth 4 (four) times daily -  with meals and at bedtime. 09/05/13  Yes Bethann Berkshire, MD  Tapentadol HCl (NUCYNTA) 75 MG TABS Take 75 mg by mouth every 6 (six) hours.   Yes Historical Provider, MD  predniSONE (DELTASONE) 50 MG tablet One tablet PO daily for 4 days 04/30/14   Zadie Rhine, MD    ALLERGIES:  Allergies  Allergen Reactions  . Penicillins Anaphylaxis  . Ibuprofen Nausea And Vomiting    GI upset, Shakes  . Aspirin Nausea And Vomiting    Gi upset    SOCIAL HISTORY:  History  Substance Use Topics  . Smoking status: Current Every Day Smoker -- 2.00 packs/day for 25 years    Types: Cigarettes  . Smokeless tobacco: Never Used  . Alcohol Use: No    FAMILY HISTORY: Family History  Problem Relation Age of Onset  .  Heart disease    . Arthritis    . Lung disease    . Cancer    . Asthma    . Diabetes      EXAM: BP 97/61 mmHg  Pulse 90  Temp(Src) 97.9 F (36.6 C) (Oral)  Resp 22  Ht 5\' 2"  (1.575 m)  Wt 180 lb (81.647 kg)  BMI 32.91 kg/m2  SpO2 97%  LMP 04/24/2014 CONSTITUTIONAL: Alert and oriented and responds appropriately to questions. Well-appearing; well-nourished HEAD: Normocephalic EYES: Conjunctivae clear, PERRL ENT: normal nose; no rhinorrhea; moist mucous membranes; pharynx without lesions noted NECK: Supple, no meningismus, no LAD  CARD: RRR; S1 and S2 appreciated; no murmurs, no clicks, no rubs, no gallops RESP: Normal chest excursion  without splinting or tachypnea; breath sounds clear and equal bilaterally; no wheezes, no rhonchi, no rales, mildly diminished at her bases bilaterally, no hypoxia or respiratory distress, speaking full sentences ABD/GI: Normal bowel sounds; non-distended; soft, non-tender, no rebound, no guarding BACK:  The back appears normal and is non-tender to palpation, there is no CVA tenderness EXT: Normal ROM in all joints; non-tender to palpation; no edema; normal capillary refill; no cyanosis    SKIN: Normal color for age and race; warm NEURO: Moves all extremities equally PSYCH: The patient's mood and manner are appropriate. Grooming and personal hygiene are appropriate.  MEDICAL DECISION MAKING: Patient here with recent x-ray that showed viral illness versus atypical pneumonia in the setting of COPD exacerbation.  She is not having any hypoxia or respiratory distress. She is afebrile and nontoxic appearing. Patient reports feeling better after Tylenol, DuoNeb, guaifenesin with codeine, prednisone and azithromycin. We'll discharge with prescriptions for the same. Discussed return precautions. She verbalized understanding and is comfortable with plan. Her chest pain is likely secondary to coughing. Her EKG is nonischemic. Doubt ACS, pulmonary embolus.        EKG Interpretation  Date/Time:  Thursday May 03 2014 01:14:56 EDT Ventricular Rate:  72 PR Interval:  112 QRS Duration: 99 QT Interval:  403 QTC Calculation: 441 R Axis:   63 Text Interpretation:  Sinus rhythm Borderline short PR interval No significant change since last tracing Confirmed by WARD,  DO, KRISTEN [phone]) on 05/03/2014 1:47:57 AM        Layla Maw Ward, DO 05/03/14 2992

## 2014-05-03 NOTE — ED Notes (Signed)
Patient refuse vital signs upon discharge

## 2014-05-03 NOTE — Discharge Instructions (Signed)
Upper Respiratory Infection, Adult °An upper respiratory infection (URI) is also sometimes known as the common cold. The upper respiratory tract includes the nose, sinuses, throat, trachea, and bronchi. Bronchi are the airways leading to the lungs. Most people improve within 1 week, but symptoms can last up to 2 weeks. A residual cough may last even longer.  °CAUSES °Many different viruses can infect the tissues lining the upper respiratory tract. The tissues become irritated and inflamed and often become very moist. Mucus production is also common. A cold is contagious. You can easily spread the virus to others by oral contact. This includes kissing, sharing a glass, coughing, or sneezing. Touching your mouth or nose and then touching a surface, which is then touched by another person, can also spread the virus. °SYMPTOMS  °Symptoms typically develop 1 to 3 days after you come in contact with a cold virus. Symptoms vary from person to person. They may include: °· Runny nose. °· Sneezing. °· Nasal congestion. °· Sinus irritation. °· Sore throat. °· Loss of voice (laryngitis). °· Cough. °· Fatigue. °· Muscle aches. °· Loss of appetite. °· Headache. °· Low-grade fever. °DIAGNOSIS  °You might diagnose your own cold based on familiar symptoms, since most people get a cold 2 to 3 times a year. Your caregiver can confirm this based on your exam. Most importantly, your caregiver can check that your symptoms are not due to another disease such as strep throat, sinusitis, pneumonia, asthma, or epiglottitis. Blood tests, throat tests, and X-rays are not necessary to diagnose a common cold, but they may sometimes be helpful in excluding other more serious diseases. Your caregiver will decide if any further tests are required. °RISKS AND COMPLICATIONS  °You may be at risk for a more severe case of the common cold if you smoke cigarettes, have chronic heart disease (such as heart failure) or lung disease (such as asthma), or if  you have a weakened immune system. The very young and very old are also at risk for more serious infections. Bacterial sinusitis, middle ear infections, and bacterial pneumonia can complicate the common cold. The common cold can worsen asthma and chronic obstructive pulmonary disease (COPD). Sometimes, these complications can require emergency medical care and may be life-threatening. °PREVENTION  °The best way to protect against getting a cold is to practice good hygiene. Avoid oral or hand contact with people with cold symptoms. Wash your hands often if contact occurs. There is no clear evidence that vitamin C, vitamin E, echinacea, or exercise reduces the chance of developing a cold. However, it is always recommended to get plenty of rest and practice good nutrition. °TREATMENT  °Treatment is directed at relieving symptoms. There is no cure. Antibiotics are not effective, because the infection is caused by a virus, not by bacteria. Treatment may include: °· Increased fluid intake. Sports drinks offer valuable electrolytes, sugars, and fluids. °· Breathing heated mist or steam (vaporizer or shower). °· Eating chicken soup or other clear broths, and maintaining good nutrition. °· Getting plenty of rest. °· Using gargles or lozenges for comfort. °· Controlling fevers with ibuprofen or acetaminophen as directed by your caregiver. °· Increasing usage of your inhaler if you have asthma. °Zinc gel and zinc lozenges, taken in the first 24 hours of the common cold, can shorten the duration and lessen the severity of symptoms. Pain medicines may help with fever, muscle aches, and throat pain. A variety of non-prescription medicines are available to treat congestion and runny nose. Your caregiver   can make recommendations and may suggest nasal or lung inhalers for other symptoms.  °HOME CARE INSTRUCTIONS  °· Only take over-the-counter or prescription medicines for pain, discomfort, or fever as directed by your  caregiver. °· Use a warm mist humidifier or inhale steam from a shower to increase air moisture. This may keep secretions moist and make it easier to breathe. °· Drink enough water and fluids to keep your urine clear or pale yellow. °· Rest as needed. °· Return to work when your temperature has returned to normal or as your caregiver advises. You may need to stay home longer to avoid infecting others. You can also use a face mask and careful hand washing to prevent spread of the virus. °SEEK MEDICAL CARE IF:  °· After the first few days, you feel you are getting worse rather than better. °· You need your caregiver's advice about medicines to control symptoms. °· You develop chills, worsening shortness of breath, or brown or red sputum. These may be signs of pneumonia. °· You develop yellow or brown nasal discharge or pain in the face, especially when you bend forward. These may be signs of sinusitis. °· You develop a fever, swollen neck glands, pain with swallowing, or white areas in the back of your throat. These may be signs of strep throat. °SEEK IMMEDIATE MEDICAL CARE IF:  °· You have a fever. °· You develop severe or persistent headache, ear pain, sinus pain, or chest pain. °· You develop wheezing, a prolonged cough, cough up blood, or have a change in your usual mucus (if you have chronic lung disease). °· You develop sore muscles or a stiff neck. °Document Released: 06/24/2000 Document Revised: 03/23/2011 Document Reviewed: 04/05/2013 °[employer]® Patient Information ©2015 [employer]. This information is not intended to replace advice given to you by your health care provider. Make sure you discuss any questions you have with your health care provider. ° °Smoking Cessation °Quitting smoking is important to your health and has many advantages. However, it is not always easy to quit since nicotine is a very addictive drug. Oftentimes, people try 3 times or more before being able to quit. This document explains  the best ways for you to prepare to quit smoking. Quitting takes hard work and a lot of effort, but you can do it. °ADVANTAGES OF QUITTING SMOKING °· You will live longer, feel better, and live better. °· Your body will feel the impact of quitting smoking almost immediately. °¨ Within 20 minutes, blood pressure decreases. Your pulse returns to its normal level. °¨ After 8 hours, carbon monoxide levels in the blood return to normal. Your oxygen level increases. °¨ After 24 hours, the chance of having a heart attack starts to decrease. Your breath, hair, and body stop smelling like smoke. °¨ After 48 hours, damaged nerve endings begin to recover. Your sense of taste and smell improve. °¨ After 72 hours, the body is virtually free of nicotine. Your bronchial tubes relax and breathing becomes easier. °¨ After 2 to 12 weeks, lungs can hold more air. Exercise becomes easier and circulation improves. °· The risk of having a heart attack, stroke, cancer, or lung disease is greatly reduced. °¨ After 1 year, the risk of coronary heart disease is cut in half. °¨ After 5 years, the risk of stroke falls to the same as a nonsmoker. °¨ After 10 years, the risk of lung cancer is cut in half and the risk of other cancers decreases significantly. °¨ After 15 years,   the risk of coronary heart disease drops, usually to the level of a nonsmoker. °· If you are pregnant, quitting smoking will improve your chances of having a healthy baby. °· The people you live with, especially any children, will be healthier. °· You will have extra money to spend on things other than cigarettes. °QUESTIONS TO THINK ABOUT BEFORE ATTEMPTING TO QUIT °You may want to talk about your answers with your health care provider. °· Why do you want to quit? °· If you tried to quit in the past, what helped and what did not? °· What will be the most difficult situations for you after you quit? How will you plan to handle them? °· Who can help you through the tough  times? Your family? Friends? A health care provider? °· What pleasures do you get from smoking? What ways can you still get pleasure if you quit? °Here are some questions to ask your health care provider: °· How can you help me to be successful at quitting? °· What medicine do you think would be best for me and how should I take it? °· What should I do if I need more help? °· What is smoking withdrawal like? How can I get information on withdrawal? °GET READY °· Set a quit date. °· Change your environment by getting rid of all cigarettes, ashtrays, matches, and lighters in your home, car, or work. Do not let people smoke in your home. °· Review your past attempts to quit. Think about what worked and what did not. °GET SUPPORT AND ENCOURAGEMENT °You have a better chance of being successful if you have help. You can get support in many ways. °· Tell your family, friends, and coworkers that you are going to quit and need their support. Ask them not to smoke around you. °· Get individual, group, or telephone counseling and support. Programs are available at local hospitals and health centers. Call your local health department for information about programs in your area. °· Spiritual beliefs and practices may help some smokers quit. °· Download a "quit meter" on your computer to keep track of quit statistics, such as how long you have gone without smoking, cigarettes not smoked, and money saved. °· Get a self-help book about quitting smoking and staying off tobacco. °LEARN NEW SKILLS AND BEHAVIORS °· Distract yourself from urges to smoke. Talk to someone, go for a walk, or occupy your time with a task. °· Change your normal routine. Take a different route to work. Drink tea instead of coffee. Eat breakfast in a different place. °· Reduce your stress. Take a hot bath, exercise, or read a book. °· Plan something enjoyable to do every day. Reward yourself for not smoking. °· Explore interactive web-based programs that  specialize in helping you quit. °GET MEDICINE AND USE IT CORRECTLY °Medicines can help you stop smoking and decrease the urge to smoke. Combining medicine with the above behavioral methods and support can greatly increase your chances of successfully quitting smoking. °· Nicotine replacement therapy helps deliver nicotine to your body without the negative effects and risks of smoking. Nicotine replacement therapy includes nicotine gum, lozenges, inhalers, nasal sprays, and skin patches. Some may be available over-the-counter and others require a prescription. °· Antidepressant medicine helps people abstain from smoking, but how this works is unknown. This medicine is available by prescription. °· Nicotinic receptor partial agonist medicine simulates the effect of nicotine in your brain. This medicine is available by prescription. °Ask your health care provider   for advice about which medicines to use and how to use them based on your health history. Your health care provider will tell you what side effects to look out for if you choose to be on a medicine or therapy. Carefully read the information on the package. Do not use any other product containing nicotine while using a nicotine replacement product.  °RELAPSE OR DIFFICULT SITUATIONS °Most relapses occur within the first 3 months after quitting. Do not be discouraged if you start smoking again. Remember, most people try several times before finally quitting. You may have symptoms of withdrawal because your body is used to nicotine. You may crave cigarettes, be irritable, feel very hungry, cough often, get headaches, or have difficulty concentrating. The withdrawal symptoms are only temporary. They are strongest when you first quit, but they will go away within 10-14 days. °To reduce the chances of relapse, try to: °· Avoid drinking alcohol. Drinking lowers your chances of successfully quitting. °· Reduce the amount of caffeine you consume. Once you quit smoking,  the amount of caffeine in your body increases and can give you symptoms, such as a rapid heartbeat, sweating, and anxiety. °· Avoid smokers because they can make you want to smoke. °· Do not let weight gain distract you. Many smokers will gain weight when they quit, usually less than 10 pounds. Eat a healthy diet and stay active. You can always lose the weight gained after you quit. °· Find ways to improve your mood other than smoking. °FOR MORE INFORMATION  °www.smokefree.gov  °Document Released: 12/23/2000 Document Revised: 05/15/2013 Document Reviewed: 04/09/2011 °[employer]® Patient Information ©2015 [employer]. This information is not intended to replace advice given to you by your health care provider. Make sure you discuss any questions you have with your health care provider. ° °

## 2014-06-29 ENCOUNTER — Encounter (HOSPITAL_COMMUNITY): Payer: Self-pay | Admitting: *Deleted

## 2014-06-29 ENCOUNTER — Emergency Department (HOSPITAL_COMMUNITY): Payer: Medicaid Other

## 2014-06-29 ENCOUNTER — Emergency Department (HOSPITAL_COMMUNITY)
Admission: EM | Admit: 2014-06-29 | Discharge: 2014-06-29 | Disposition: A | Discharge status: Home / Self Care | Payer: Medicaid Other | Attending: Emergency Medicine | Admitting: Emergency Medicine

## 2014-06-29 DIAGNOSIS — F329 Major depressive disorder, single episode, unspecified: Secondary | ICD-10-CM | POA: Insufficient documentation

## 2014-06-29 DIAGNOSIS — G8929 Other chronic pain: Secondary | ICD-10-CM | POA: Diagnosis not present

## 2014-06-29 DIAGNOSIS — Z872 Personal history of diseases of the skin and subcutaneous tissue: Secondary | ICD-10-CM | POA: Diagnosis not present

## 2014-06-29 DIAGNOSIS — Z79899 Other long term (current) drug therapy: Secondary | ICD-10-CM | POA: Diagnosis not present

## 2014-06-29 DIAGNOSIS — Z8709 Personal history of other diseases of the respiratory system: Secondary | ICD-10-CM | POA: Insufficient documentation

## 2014-06-29 DIAGNOSIS — I10 Essential (primary) hypertension: Secondary | ICD-10-CM | POA: Diagnosis not present

## 2014-06-29 DIAGNOSIS — F410 Panic disorder [episodic paroxysmal anxiety] without agoraphobia: Secondary | ICD-10-CM | POA: Insufficient documentation

## 2014-06-29 DIAGNOSIS — Z880 Allergy status to penicillin: Secondary | ICD-10-CM | POA: Diagnosis not present

## 2014-06-29 DIAGNOSIS — Z7952 Long term (current) use of systemic steroids: Secondary | ICD-10-CM | POA: Insufficient documentation

## 2014-06-29 DIAGNOSIS — Z720 Tobacco use: Secondary | ICD-10-CM | POA: Insufficient documentation

## 2014-06-29 DIAGNOSIS — M25511 Pain in right shoulder: Secondary | ICD-10-CM | POA: Diagnosis not present

## 2014-06-29 MED ORDER — HYDROCODONE-ACETAMINOPHEN 5-325 MG PO TABS: 1.0000 | ORAL_TABLET | ORAL | Status: DC | PRN

## 2014-06-29 MED ORDER — HYDROCODONE-ACETAMINOPHEN 5-325 MG PO TABS
1.0000 | ORAL_TABLET | Freq: Once | ORAL | Status: AC
  Administered 2014-06-29: 1 via ORAL
  Filled 2014-06-29: qty 1

## 2014-06-29 NOTE — ED Provider Notes (Signed)
CSN: [id number]     Arrival date & time 06/29/14  2125 History   First MD Initiated Contact with Patient 06/29/14 2146     Chief Complaint  Patient presents with  . Shoulder Pain     (Consider location/radiation/quality/duration/timing/severity/associated sxs/prior Treatment) The history is provided by the patient.   [name] is a 44 y.o. right handed female presenting with a 4 day history of right shoulder pain which is worsened with movement and palpation. 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  Patient denies any specific injury or inciting event.  She denies weakness or numbness in the extremity and denies chest pain, sob, fever or chills.. Pain is worsened with attempts to raise her arm over shoulder level and it radiates to her right elbow with certain movements.  She has taken tylenol without relief of pain.    Past Medical History  Diagnosis Date  . Scoliosis   . Hypertension   . Panic attack   . Back pain   . Depression   . Anxiety   . Ulcer   . Chronic back pain 02/04/13    By patient report  . Laryngitis, chronic    Past Surgical History  Procedure Laterality Date  . Back surgery    . Cesarean section    . Neck surgery    . Tubal ligation    . Cholecystectomy  11/27/2011    Procedure: LAPAROSCOPIC CHOLECYSTECTOMY;  Surgeon: Fabio Bering, MD;  Location: [facility];  Service: General;  Laterality: N/A;   Family History  Problem Relation Age of Onset  . Heart disease    . Arthritis    . Lung disease    . Cancer    . Asthma    . Diabetes     History  Substance Use Topics  . Smoking status: Current Every Day Smoker -- 2.00 packs/day for 25 years    Types: Cigarettes  . Smokeless tobacco: Never Used  . Alcohol Use: No   OB History    Gravida Para  Term Preterm AB TAB SAB Ectopic Multiple Living   Review of Systems  Constitutional: Negative for fever.  Respiratory: Negative for chest tightness and shortness of breath.   Cardiovascular: Negative for chest pain.  Musculoskeletal: Positive for arthralgias. Negative for myalgias and joint swelling.  Neurological: Negative for weakness and numbness.      Allergies  Penicillins; Ibuprofen; and Aspirin  Home Medications   Prior to Admission medications   Medication Sig Start Date End Date Taking? Authorizing Provider  albuterol (PROVENTIL HFA;VENTOLIN HFA) 108 (90 BASE) MCG/ACT inhaler Inhale 2 puffs into the lungs every 4 (four) hours as needed for wheezing or shortness of breath. 05/03/14  Yes Kristen N Ward, DO  lisinopril-hydrochlorothiazide (PRINZIDE,ZESTORETIC) 20-25 MG per tablet Take 1 tablet by mouth daily.    Yes Historical Provider, MD  methocarbamol (ROBAXIN) 750 MG tablet Take 750 mg by mouth 4 (four) times daily.     Yes Historical Provider, MD  albuterol (PROVENTIL HFA;VENTOLIN HFA) 108 (90 BASE) MCG/ACT inhaler Inhale 2 puffs into the lungs every 6 (six) hours as needed. For shortness of breath    Historical Provider, MD  ALPRAZolam Prudy Feeler) 1 MG tablet  Take 2 mg by mouth 4 (four) times daily.     Historical Provider, MD  azithromycin (ZITHROMAX) 250 MG tablet Take 1 tablet (250 mg total) by mouth daily. Take first 2 tablets together, then 1 every day until finished. 05/03/14   Kristen N Ward, DO  celecoxib (CELEBREX) 100 MG capsule Take 100 mg by mouth daily.    Historical Provider, MD  citalopram (CELEXA) 20 MG tablet Take 20 mg by mouth daily.     Historical Provider, MD  guaiFENesin-codeine 100-10 MG/5ML syrup Take 10 mLs by mouth every 6 (six) hours as needed for cough. 05/03/14   Kristen N Ward, DO  HYDROcodone-acetaminophen (NORCO/VICODIN) 5-325 MG per tablet Take 1 tablet by mouth every 4 (four) hours as needed. 06/29/14   [name], PA-C   pantoprazole (PROTONIX) 40 MG tablet Take 40 mg by mouth 2 (two) times daily.     Historical Provider, MD  potassium chloride (K-DUR) 10 MEQ tablet Take 10 mEq by mouth daily.    Historical Provider, MD  predniSONE (DELTASONE) 20 MG tablet Take 3 tablets (60 mg total) by mouth daily. 05/03/14   Kristen N Ward, DO  pregabalin (LYRICA) 100 MG capsule Take 100 mg by mouth 3 (three) times daily.    Historical Provider, MD  sucralfate (CARAFATE) 1 G tablet Take 1 tablet (1 g total) by mouth 4 (four) times daily -  with meals and at bedtime. 09/05/13   Bethann Berkshire, MD  Tapentadol HCl (NUCYNTA) 75 MG TABS Take 75 mg by mouth every 6 (six) hours.    Historical Provider, MD   BP 108/77 mmHg  Pulse 107  Temp(Src) 98.4 F (36.9 C) (Oral)  Resp 16  Ht  (1.575 m)  Wt 180 lb (81.647 kg)  BMI 32.91 kg/m2  SpO2 99%  LMP 06/19/2014 Physical Exam  Constitutional: She appears well-developed and well-nourished.  HENT:  Head: Atraumatic.  Neck: Normal range of motion.  Cardiovascular:  Pulses equal bilaterally  Musculoskeletal: She exhibits tenderness. She exhibits no edema.       Right shoulder: She exhibits decreased range of motion and bony tenderness. She exhibits no swelling, no effusion, no crepitus, no deformity, no spasm, normal pulse and normal strength.  ttp across right upper shoulder, at the [facility] joint and along her posterior upper back.  No joint crepitus with ROM.    Neurological: She is alert. She has normal strength. She displays normal reflexes. No sensory deficit.  Skin: Skin is warm and dry.  Psychiatric: She has a normal mood and affect.    ED Course  Procedures (including critical care time) Labs Review Labs Reviewed - No data to display  Imaging Review Dg Shoulder Right  06/29/2014   CLINICAL DATA:  Hurt shoulder lifting her sister a few days ago. Superior and posterior right shoulder pain.  EXAM: RIGHT SHOULDER - 2+ VIEW  COMPARISON:  None.  FINDINGS: There is no  evidence of fracture or dislocation. There is no evidence of arthropathy or other focal bone abnormality. Soft tissues are unremarkable.  IMPRESSION: Negative.   Electronically Signed   By: Burman Nieves M.D.   On: 06/29/2014 22:46     EKG Interpretation None      MDM   Final diagnoses:  Shoulder pain, acute, right    Patients labs and/or radiological studies were reviewed and considered during the medical decision making and disposition process.  Results were also discussed with patient.  Pt is reproducible with movement, suspect shoulder strain,  could possibly have rotator injury. Sling provided to rest the joint, discussed maintaining mobility. Ice tx x 2 days, start heat tx on day 3.  Hydrocodone, f/u with pcp if sx persist.  The patient appears reasonably screened and/or stabilized for discharge and I doubt any other medical condition or other [facility] requiring further screening, evaluation, or treatment in the ED at this time prior to discharge.      [name], PA-C 06/30/14 0122  Benjiman Core, MD 06/30/14 [phone]

## 2014-06-29 NOTE — Discharge Instructions (Signed)
Shoulder Pain The shoulder is the joint that connects your arms to your body. The bones that form the shoulder joint include the upper arm bone (humerus), the shoulder blade (scapula), and the collarbone (clavicle). The top of the humerus is shaped like a ball and fits into a rather flat socket on the scapula (glenoid cavity). A combination of muscles and strong, fibrous tissues that connect muscles to bones (tendons) support your shoulder joint and hold the ball in the socket. Small, fluid-filled sacs (bursae) are located in different areas of the joint. They act as cushions between the bones and the overlying soft tissues and help reduce friction between the gliding tendons and the bone as you move your arm. Your shoulder joint allows a wide range of motion in your arm. This range of motion allows you to do things like scratch your back or throw a ball. However, this range of motion also makes your shoulder more prone to pain from overuse and injury. Causes of shoulder pain can originate from both injury and overuse and usually can be grouped in the following four categories:  Redness, swelling, and pain (inflammation) of the tendon (tendinitis) or the bursae (bursitis).  Instability, such as a dislocation of the joint.  Inflammation of the joint (arthritis).  Broken bone (fracture). HOME CARE INSTRUCTIONS   Apply ice to the sore area.  Put ice in a plastic bag.  Place a towel between your skin and the bag.  Leave the ice on for 15-20 minutes, 3-4 times per day for the first 2 days, or as directed by your health care provider.  Stop using cold packs if they do not help with the pain.  If you have a shoulder sling or immobilizer, wear it as long as your caregiver instructs. Only remove it to shower or bathe. Move your arm as little as possible, but keep your hand moving to prevent swelling.  Squeeze a soft ball or foam pad as much as possible to help prevent swelling.  Only take  over-the-counter or prescription medicines for pain, discomfort, or fever as directed by your caregiver. SEEK MEDICAL CARE IF:   Your shoulder pain increases, or new pain develops in your arm, hand, or fingers.  Your hand or fingers become cold and numb.  Your pain is not relieved with medicines. SEEK IMMEDIATE MEDICAL CARE IF:   Your arm, hand, or fingers are numb or tingling.  Your arm, hand, or fingers are significantly swollen or turn white or blue. MAKE SURE YOU:   Understand these instructions.  Will watch your condition.  Will get help right away if you are not doing well or get worse. Document Released: 10/08/2004 Document Revised: 05/15/2013 Document Reviewed: 12/13/2010 [facility] Patient Information 2015 [place], [place]. This information is not intended to replace advice given to you by your health care provider. Make sure you discuss any questions you have with your health care provider.   Your x-rays are negative tonight.  It's possible you have muscle or joint strain from overuse.  You may use the sling provided, but make sure you are maintaining range of motion of your joint as discussed.  Apply ice to shoulder as much as possible for the next 2 days.  You may add a heating pad starting on Monday 20 minutes several times daily.  Follow-up with your primary doctor for recheck if your symptoms are not improving over the next week.

## 2014-06-29 NOTE — ED Notes (Signed)
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

## 2014-06-29 NOTE — ED Notes (Signed)
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 Pt states she is having right shoulder pain x 3-4 days. States pain is just getting worse.

## 2014-08-15 ENCOUNTER — Encounter (HOSPITAL_COMMUNITY): Payer: Self-pay | Admitting: Emergency Medicine

## 2014-08-15 ENCOUNTER — Emergency Department (HOSPITAL_COMMUNITY)
Admission: EM | Admit: 2014-08-15 | Discharge: 2014-08-15 | Disposition: A | Discharge status: Home / Self Care | Payer: Medicaid Other | Attending: Emergency Medicine | Admitting: Emergency Medicine

## 2014-08-15 DIAGNOSIS — I10 Essential (primary) hypertension: Secondary | ICD-10-CM | POA: Diagnosis not present

## 2014-08-15 DIAGNOSIS — Y9289 Other specified places as the place of occurrence of the external cause: Secondary | ICD-10-CM | POA: Insufficient documentation

## 2014-08-15 DIAGNOSIS — F329 Major depressive disorder, single episode, unspecified: Secondary | ICD-10-CM | POA: Diagnosis not present

## 2014-08-15 DIAGNOSIS — G8929 Other chronic pain: Secondary | ICD-10-CM | POA: Insufficient documentation

## 2014-08-15 DIAGNOSIS — F410 Panic disorder [episodic paroxysmal anxiety] without agoraphobia: Secondary | ICD-10-CM | POA: Insufficient documentation

## 2014-08-15 DIAGNOSIS — Z880 Allergy status to penicillin: Secondary | ICD-10-CM | POA: Insufficient documentation

## 2014-08-15 DIAGNOSIS — W57XXXA Bitten or stung by nonvenomous insect and other nonvenomous arthropods, initial encounter: Secondary | ICD-10-CM | POA: Diagnosis not present

## 2014-08-15 DIAGNOSIS — Z79899 Other long term (current) drug therapy: Secondary | ICD-10-CM | POA: Diagnosis not present

## 2014-08-15 DIAGNOSIS — S30860A Insect bite (nonvenomous) of lower back and pelvis, initial encounter: Secondary | ICD-10-CM | POA: Diagnosis not present

## 2014-08-15 DIAGNOSIS — Y998 Other external cause status: Secondary | ICD-10-CM | POA: Diagnosis not present

## 2014-08-15 DIAGNOSIS — Y9389 Activity, other specified: Secondary | ICD-10-CM | POA: Diagnosis not present

## 2014-08-15 DIAGNOSIS — Z8739 Personal history of other diseases of the musculoskeletal system and connective tissue: Secondary | ICD-10-CM | POA: Insufficient documentation

## 2014-08-15 DIAGNOSIS — Z8709 Personal history of other diseases of the respiratory system: Secondary | ICD-10-CM | POA: Diagnosis not present

## 2014-08-15 DIAGNOSIS — Z720 Tobacco use: Secondary | ICD-10-CM | POA: Insufficient documentation

## 2014-08-15 MED ORDER — DOXYCYCLINE HYCLATE 100 MG PO TABS
100.0000 mg | ORAL_TABLET | Freq: Once | ORAL | Status: AC
  Administered 2014-08-15: 100 mg via ORAL
  Filled 2014-08-15: qty 1

## 2014-08-15 MED ORDER — DOXYCYCLINE HYCLATE 100 MG PO CAPS: 100.0000 mg | ORAL_CAPSULE | Freq: Two times a day (BID) | ORAL | Status: DC

## 2014-08-15 NOTE — Discharge Instructions (Signed)
Take anabolic doxycycline as directed for the next 7 days. Recommend soaking the areas in warm bath water with Epsom salt. Return for the development of any distinct abscesses. Follow-up with your doctor.

## 2014-08-15 NOTE — ED Provider Notes (Signed)
CSN: [id number]     Arrival date & time 08/15/14  1114 History   First MD Initiated Contact with Patient 08/15/14 1257     Chief Complaint  Patient presents with  . Insect Bite     (Consider location/radiation/quality/duration/timing/severity/associated sxs/prior Treatment) The history is provided by the patient.   patient with report of some red areas on across the anterior abdomen in the left buttocks that she thinks are due to some sort of insect bite. The one on the left buttocks is the largest in the most painful. Not sure what she was bitten by. No real systemic symptoms.  Past Medical History  Diagnosis Date  . Scoliosis   . Hypertension   . Panic attack   . Back pain   . Depression   . Anxiety   . Ulcer   . Chronic back pain 02/04/13    By patient report  . Laryngitis, chronic    Past Surgical History  Procedure Laterality Date  . Back surgery    . Cesarean section    . Neck surgery    . Tubal ligation    . Cholecystectomy  11/27/2011    Procedure: LAPAROSCOPIC CHOLECYSTECTOMY;  Surgeon: Fabio Bering, MD;  Location: [facility];  Service: General;  Laterality: N/A;   Family History  Problem Relation Age of Onset  . Heart disease    . Arthritis    . Lung disease    . Cancer    . Asthma    . Diabetes     History  Substance Use Topics  . Smoking status: Current Every Day Smoker -- 2.00 packs/day for 25 years    Types: Cigarettes  . Smokeless tobacco: Never Used  . Alcohol Use: No   OB History    Gravida Para Term Preterm AB TAB SAB Ectopic Multiple Living   4 3 3  1     3      Review of Systems  Constitutional: Negative for fever.  HENT: Negative for congestion.   Eyes: Negative for redness.  Respiratory: Negative for shortness of breath.   Cardiovascular: Negative for chest pain.  Gastrointestinal: Negative for abdominal pain.  Genitourinary: Negative for dysuria.  Musculoskeletal: Positive for back pain.  Skin: Positive for rash.  Neurological:  Negative for headaches.  Hematological: Does not bruise/bleed easily.  Psychiatric/Behavioral: Negative for confusion.      Allergies  Penicillins; Ibuprofen; and Aspirin  Home Medications   Prior to Admission medications   Medication Sig Start Date End Date Taking? Authorizing Provider  albuterol (PROVENTIL HFA;VENTOLIN HFA) 108 (90 BASE) MCG/ACT inhaler Inhale 2 puffs into the lungs every 4 (four) hours as needed for wheezing or shortness of breath. 05/03/14  Yes Kristen N Ward, DO  ALPRAZolam (XANAX) 1 MG tablet Take 2 mg by mouth 4 (four) times daily.    Yes Historical Provider, MD  amphetamine-dextroamphetamine (ADDERALL) 10 MG tablet Take 10 mg by mouth 2 (two) times daily with a meal.   Yes Historical Provider, MD  celecoxib (CELEBREX) 100 MG capsule Take 100 mg by mouth daily.   Yes Historical Provider, MD  citalopram (CELEXA) 20 MG tablet Take 20 mg by mouth daily.    Yes Historical Provider, MD  lisinopril-hydrochlorothiazide (PRINZIDE,ZESTORETIC) 20-25 MG per tablet Take 1 tablet by mouth daily.    Yes Historical Provider, MD  methocarbamol (ROBAXIN) 750 MG tablet Take 750 mg by mouth 4 (four) times daily.     Yes Historical Provider, MD  oxyCODONE-acetaminophen (PERCOCET) 7.5-325  MG per tablet TK 1 T PO TID PRN P 05/22/14  Yes Historical Provider, MD  pantoprazole (PROTONIX) 40 MG tablet Take 40 mg by mouth 2 (two) times daily.    Yes Historical Provider, MD  potassium chloride (K-DUR) 10 MEQ tablet Take 10 mEq by mouth daily.   Yes Historical Provider, MD  pregabalin (LYRICA) 100 MG capsule Take 100 mg by mouth 3 (three) times daily.   Yes Historical Provider, MD  sucralfate (CARAFATE) 1 G tablet Take 1 tablet (1 g total) by mouth 4 (four) times daily -  with meals and at bedtime. 09/05/13  Yes Bethann Berkshire, MD  azithromycin (ZITHROMAX) 250 MG tablet Take 1 tablet (250 mg total) by mouth daily. Take first 2 tablets together, then 1 every day until finished. Patient not taking:  Reported on 08/15/2014 05/03/14   Layla Maw Ward, DO  doxycycline (VIBRAMYCIN) 100 MG capsule Take 1 capsule (100 mg total) by mouth 2 (two) times daily. 08/15/14   Vanetta Mulders, MD  HYDROcodone-acetaminophen (NORCO/VICODIN) 5-325 MG per tablet Take 1 tablet by mouth every 4 (four) hours as needed. Patient not taking: Reported on 08/15/2014 06/29/14   [name], PA-C  predniSONE (DELTASONE) 20 MG tablet Take 3 tablets (60 mg total) by mouth daily. Patient not taking: Reported on 08/15/2014 05/03/14   Kristen N Ward, DO   BP 102/57 mmHg  Pulse 83  Temp(Src) 98.1 F (36.7 C) (Oral)  Resp 18  SpO2 98%  LMP  Physical Exam  Constitutional: She is oriented to person, place, and time. She appears well-developed and well-nourished. No distress.  HENT:  Head: Normocephalic and atraumatic.  Mouth/Throat: Oropharynx is clear and moist.  Eyes: Conjunctivae and EOM are normal. Pupils are equal, round, and reactive to light.  Neck: Normal range of motion. Neck supple.  Cardiovascular: Normal rate and regular rhythm.   No murmur heard. Pulmonary/Chest: Effort normal and breath sounds normal.  Abdominal: Soft. Bowel sounds are normal. There is no tenderness.  Musculoskeletal: Normal range of motion.  Neurological: She is alert and oriented to person, place, and time. No cranial nerve deficit.  Skin: Skin is warm. There is erythema.  5 areas consistent with a bug bite of some sort. Largest is left buttocks with 5 cm of erythema 2 cm of induration no fluctuance. In addition across the abdomen area or four other lesions of with erythema measuring anywhere from 1-2 cm and just a small amount of induration and no fluctuance.  Nursing note and vitals reviewed.   ED Course  Procedures (including critical care time) Labs Review Labs Reviewed - No data to display  Imaging Review No results found.   EKG Interpretation None      MDM   Final diagnoses:  Insect bite    Patient with a total of 5  erythematous lesions that could be related to bug bites. The biggest is on the left buttocks with an area of erythema measuring 5 cm. No fluctuance area of induration they're measuring 2 cm. The other areas have erythema ranging from 1 cm to 2 cm again no fluctuance and very small areas of induration.  We'll treat with doxycycline. Warm soaks with Epsom salt. Follow-up with her doctor. Return for development of any abscess. Currently no evidence of abscess.    Vanetta Mulders, MD 08/15/14 1343

## 2014-08-15 NOTE — ED Notes (Addendum)
Pt reports was in the shower a few days ago and reports a "knot" to left buttock.pt reports sites noted to left buttock,right thigh and right side of abdomen. Moderate redness noted around sites. nad noted. Pt also reports headache,nausea, dizziness for last several days.

## 2015-02-04 ENCOUNTER — Encounter: Payer: Self-pay | Admitting: Obstetrics and Gynecology

## 2015-02-04 ENCOUNTER — Other Ambulatory Visit: Payer: Self-pay | Admitting: Obstetrics and Gynecology

## 2015-12-05 ENCOUNTER — Emergency Department (HOSPITAL_COMMUNITY): Payer: Medicaid Other

## 2015-12-05 ENCOUNTER — Inpatient Hospital Stay (HOSPITAL_COMMUNITY)
Admission: EM | Admit: 2015-12-05 | Discharge: 2015-12-07 | DRG: 254 | Disposition: A | Discharge status: Home / Self Care | Payer: Medicaid Other | Attending: Vascular Surgery | Admitting: Vascular Surgery

## 2015-12-05 ENCOUNTER — Encounter (HOSPITAL_COMMUNITY): Payer: Self-pay | Admitting: Emergency Medicine

## 2015-12-05 DIAGNOSIS — I998 Other disorder of circulatory system: Secondary | ICD-10-CM | POA: Diagnosis present

## 2015-12-05 DIAGNOSIS — G8929 Other chronic pain: Secondary | ICD-10-CM | POA: Diagnosis present

## 2015-12-05 DIAGNOSIS — Z6832 Body mass index (BMI) 32.0-32.9, adult: Secondary | ICD-10-CM

## 2015-12-05 DIAGNOSIS — I10 Essential (primary) hypertension: Secondary | ICD-10-CM | POA: Diagnosis present

## 2015-12-05 DIAGNOSIS — Z79891 Long term (current) use of opiate analgesic: Secondary | ICD-10-CM

## 2015-12-05 DIAGNOSIS — I70228 Atherosclerosis of native arteries of extremities with rest pain, other extremity: Secondary | ICD-10-CM

## 2015-12-05 DIAGNOSIS — D751 Secondary polycythemia: Secondary | ICD-10-CM | POA: Diagnosis present

## 2015-12-05 DIAGNOSIS — M419 Scoliosis, unspecified: Secondary | ICD-10-CM | POA: Diagnosis present

## 2015-12-05 DIAGNOSIS — M549 Dorsalgia, unspecified: Secondary | ICD-10-CM | POA: Diagnosis present

## 2015-12-05 DIAGNOSIS — Z833 Family history of diabetes mellitus: Secondary | ICD-10-CM

## 2015-12-05 DIAGNOSIS — F329 Major depressive disorder, single episode, unspecified: Secondary | ICD-10-CM | POA: Diagnosis present

## 2015-12-05 DIAGNOSIS — E876 Hypokalemia: Secondary | ICD-10-CM | POA: Diagnosis present

## 2015-12-05 DIAGNOSIS — T43221A Poisoning by selective serotonin reuptake inhibitors, accidental (unintentional), initial encounter: Secondary | ICD-10-CM | POA: Diagnosis present

## 2015-12-05 DIAGNOSIS — F419 Anxiety disorder, unspecified: Secondary | ICD-10-CM | POA: Diagnosis present

## 2015-12-05 DIAGNOSIS — I742 Embolism and thrombosis of arteries of the upper extremities: Principal | ICD-10-CM | POA: Diagnosis present

## 2015-12-05 DIAGNOSIS — Z825 Family history of asthma and other chronic lower respiratory diseases: Secondary | ICD-10-CM

## 2015-12-05 DIAGNOSIS — E7800 Pure hypercholesterolemia, unspecified: Secondary | ICD-10-CM | POA: Diagnosis present

## 2015-12-05 DIAGNOSIS — F17210 Nicotine dependence, cigarettes, uncomplicated: Secondary | ICD-10-CM | POA: Diagnosis present

## 2015-12-05 DIAGNOSIS — E669 Obesity, unspecified: Secondary | ICD-10-CM | POA: Diagnosis present

## 2015-12-05 DIAGNOSIS — R0989 Other specified symptoms and signs involving the circulatory and respiratory systems: Secondary | ICD-10-CM

## 2015-12-05 LAB — CBC WITH DIFFERENTIAL/PLATELET
BASOS PCT: 0 %
Basophils Absolute: 0 10*3/uL (ref 0.0–0.1)
Eosinophils Absolute: 0 10*3/uL (ref 0.0–0.7)
Eosinophils Relative: 0 %
HEMATOCRIT: 50.5 % — AB (ref 36.0–46.0)
HEMOGLOBIN: 19.6 g/dL — AB (ref 12.0–15.0)
LYMPHS PCT: 11 %
Lymphs Abs: 2.3 10*3/uL (ref 0.7–4.0)
MCH: 34.1 pg — AB (ref 26.0–34.0)
MCHC: 37.5 g/dL — AB (ref 30.0–36.0)
MCV: 89.9 fL (ref 78.0–100.0)
MONO ABS: 1 10*3/uL (ref 0.1–1.0)
MONOS PCT: 5 %
NEUTROS ABS: 17.5 10*3/uL — AB (ref 1.7–7.7)
Neutrophils Relative %: 84 %
Platelets: 314 10*3/uL (ref 150–400)
RBC: 5.62 MIL/uL — ABNORMAL HIGH (ref 3.87–5.11)
RDW: 12.7 % (ref 11.5–15.5)
WBC: 20.8 10*3/uL — ABNORMAL HIGH (ref 4.0–10.5)

## 2015-12-05 LAB — ACETAMINOPHEN LEVEL

## 2015-12-05 LAB — I-STAT CHEM 8, ED
BUN: 14 mg/dL (ref 6–20)
CREATININE: 1.3 mg/dL — AB (ref 0.44–1.00)
Calcium, Ion: 1.01 mmol/L — ABNORMAL LOW (ref 1.15–1.40)
Chloride: 101 mmol/L (ref 101–111)
Glucose, Bld: 174 mg/dL — ABNORMAL HIGH (ref 65–99)
HEMATOCRIT: 53 % — AB (ref 36.0–46.0)
HEMOGLOBIN: 18 g/dL — AB (ref 12.0–15.0)
Potassium: 4 mmol/L (ref 3.5–5.1)
SODIUM: 137 mmol/L (ref 135–145)
TCO2: 24 mmol/L (ref 0–100)

## 2015-12-05 LAB — BASIC METABOLIC PANEL
Anion gap: 14 (ref 5–15)
BUN: 13 mg/dL (ref 6–20)
CHLORIDE: 102 mmol/L (ref 101–111)
CO2: 20 mmol/L — AB (ref 22–32)
CREATININE: 1.38 mg/dL — AB (ref 0.44–1.00)
Calcium: 9.7 mg/dL (ref 8.9–10.3)
GFR calc Af Amer: 53 mL/min — ABNORMAL LOW (ref >60)
GFR calc non Af Amer: 46 mL/min — ABNORMAL LOW (ref >60)
Glucose, Bld: 184 mg/dL — ABNORMAL HIGH (ref 65–99)
Potassium: 3.1 mmol/L — ABNORMAL LOW (ref 3.5–5.1)
Sodium: 136 mmol/L (ref 135–145)

## 2015-12-05 LAB — TROPONIN I: Troponin I: <0.03 ng/mL (ref <0.03)

## 2015-12-05 LAB — I-STAT CG4 LACTIC ACID, ED: Lactic Acid, Venous: 2.3 mmol/L (ref 0.5–1.9)

## 2015-12-05 LAB — ETHANOL: Alcohol, Ethyl (B): <5 mg/dL (ref <5)

## 2015-12-05 LAB — I-STAT TROPONIN, ED: Troponin i, poc: 0 ng/mL (ref 0.00–0.08)

## 2015-12-05 LAB — SALICYLATE LEVEL

## 2015-12-05 MED ORDER — IOPAMIDOL (ISOVUE-370) INJECTION 76%
100.0000 mL | Freq: Once | INTRAVENOUS | Status: AC | PRN
  Administered 2015-12-05: 60 mL via INTRAVENOUS

## 2015-12-05 MED ORDER — HEPARIN (PORCINE) IN NACL 100-0.45 UNIT/ML-% IJ SOLN
INTRAMUSCULAR | Status: AC
  Administered 2015-12-05: 22:00:00
  Filled 2015-12-05: qty 250

## 2015-12-05 MED ORDER — HEPARIN SODIUM (PORCINE) 5000 UNIT/ML IJ SOLN
4000.0000 [IU] | Freq: Once | INTRAMUSCULAR | Status: AC
  Administered 2015-12-05: 4000 [IU] via INTRAVENOUS

## 2015-12-05 NOTE — ED Notes (Signed)
Dr. Darrick Penna contacted about patient arrival.

## 2015-12-05 NOTE — ED Notes (Addendum)
Pt arrived w/ Heparin infusing @ 9.31mL/hr. Verified w/ Huntley Dec, RN.

## 2015-12-05 NOTE — ED Provider Notes (Signed)
[facility] Provider Note   CSN: [id number] Arrival date & time: 12/05/15  2102 By signing my name below, I, Linus Galas, attest that this documentation has been prepared under the direction and in the presence of No att. providers found. Electronically Signed: Linus Galas, ED Scribe. 12/05/15. 9:16 PM.  History   Chief Complaint Chief Complaint  Patient presents with  . Drug Overdose   The history is provided by the patient and a friend. No language interpreter was used.   HPI Comments: [name] is a 45 y.o. female who presents to the Emergency Department with a PMHx of HTN, chronic back pain, anxiety and depression complaining of a prescription drug overdose prior to arrival. Boyfriend states that when he came home the pt was fine. Later, when the boyfriend tried to arouse her he was unable to. She then woke up screaming that her left arm was hurting. Pt states she forget that she took her medication today and accidental took a double dose on Celexa and a muscle relaxer. Pt denies any suicidal ideation, fevers, chills, CP, SOB, N/V/D, or any other symptoms at this time. Boyfriend reports last known normal at 3 pm. Pt denies any drug or alcohol abuse.   Past Medical History:  Diagnosis Date  . Anxiety   . Back pain   . Chronic back pain 02/04/13   By patient report  . Depression   . Hypertension   . Laryngitis, chronic   . Panic attack   . Scoliosis   . Ulcer [facility])    Patient Active Problem List   Diagnosis Date Noted  . 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 Knee pain 05/31/2012  . Bilateral leg weakness 11/20/2010   Past Surgical History:  Procedure Laterality Date  . BACK SURGERY    . CESAREAN SECTION    . CHOLECYSTECTOMY  11/27/2011   Procedure: LAPAROSCOPIC CHOLECYSTECTOMY;  Surgeon: Fabio Bering, MD;  Location: [facility];  Service: General;  Laterality: N/A;  . NECK SURGERY    . TUBAL  LIGATION     OB History    Gravida Para Term Preterm AB Living   4 3 3   1 3    SAB TAB Ectopic Multiple Live Births                 Home Medications    Prior to Admission medications   Medication Sig Start Date End Date Taking? Authorizing Provider  albuterol (PROVENTIL HFA;VENTOLIN HFA) 108 (90 BASE) MCG/ACT inhaler Inhale 2 puffs into the lungs every 4 (four) hours as needed for wheezing or shortness of breath. 05/03/14   Kristen N Ward, DO  ALPRAZolam Prudy Feeler) 1 MG tablet Take 2 mg by mouth 4 (four) times daily.     Historical Provider, MD  amphetamine-dextroamphetamine (ADDERALL) 10 MG tablet Take 10 mg by mouth 2 (two) times daily with a meal.    Historical Provider, MD  azithromycin (ZITHROMAX) 250 MG tablet Take 1 tablet (250 mg total) by mouth daily. Take first 2 tablets together, then 1 every day until finished. Patient not taking: Reported on 08/15/2014 05/03/14   Layla Maw Ward, DO  celecoxib (CELEBREX) 100 MG capsule Take 100 mg by mouth daily.    Historical Provider, MD  citalopram (CELEXA) 20 MG tablet Take 20 mg by mouth daily.     Historical Provider, MD  doxycycline (VIBRAMYCIN) 100 MG capsule Take 1 capsule (100 mg total) by mouth 2 (two)  times daily. 08/15/14   Vanetta Mulders, MD  HYDROcodone-acetaminophen (NORCO/VICODIN) 5-325 MG per tablet Take 1 tablet by mouth every 4 (four) hours as needed. Patient not taking: Reported on 08/15/2014 06/29/14   [name], PA-C  lisinopril-hydrochlorothiazide (PRINZIDE,ZESTORETIC) 20-25 MG per tablet Take 1 tablet by mouth daily.     Historical Provider, MD  methocarbamol (ROBAXIN) 750 MG tablet Take 750 mg by mouth 4 (four) times daily.      Historical Provider, MD  oxyCODONE-acetaminophen (PERCOCET) 7.5-325 MG per tablet TK 1 T PO TID PRN P 05/22/14   Historical Provider, MD  pantoprazole (PROTONIX) 40 MG tablet Take 40 mg by mouth 2 (two) times daily.     Historical Provider, MD  potassium chloride (K-DUR) 10 MEQ tablet Take 10 mEq by mouth  daily.    Historical Provider, MD  predniSONE (DELTASONE) 20 MG tablet Take 3 tablets (60 mg total) by mouth daily. Patient not taking: Reported on 08/15/2014 05/03/14   Kristen N Ward, DO  pregabalin (LYRICA) 100 MG capsule Take 100 mg by mouth 3 (three) times daily.    Historical Provider, MD  sucralfate (CARAFATE) 1 G tablet Take 1 tablet (1 g total) by mouth 4 (four) times daily -  with meals and at bedtime. 09/05/13   Bethann Berkshire, MD   Family History Family History  Problem Relation Age of Onset  . Heart disease    . Arthritis    . Lung disease    . Cancer    . Asthma    . Diabetes     Social History Social History  Substance Use Topics  . Smoking status: Current Every Day Smoker    Packs/day: 2.00    Years: 25.00    Types: Cigarettes  . Smokeless tobacco: Never Used  . Alcohol use No   Allergies   Penicillins; Ibuprofen; and Aspirin  Review of Systems Review of Systems  Constitutional: Negative for chills and fever.  Gastrointestinal: Negative for diarrhea, nausea and vomiting.  Musculoskeletal: Positive for myalgias.  Psychiatric/Behavioral: Negative for suicidal ideas.  All other systems reviewed and are negative.  Physical Exam Updated Vital Signs Pulse (!) 50   Resp (!) 27   Ht 5\' 4"  (1.626 m)   Wt 180 lb (81.6 kg)   LMP  (LMP Unknown)   SpO2 (!) 83%   BMI 30.90 kg/m   Physical Exam  Constitutional: She appears well-developed and well-nourished. No distress.  HENT:  Head: Normocephalic and atraumatic.  Eyes: EOM are normal.  Neck: Normal range of motion.  Cardiovascular: Normal rate, regular rhythm and normal heart sounds.   Pulmonary/Chest: Effort normal and breath sounds normal.  Abdominal: Soft. She exhibits no distension. There is no tenderness.  Musculoskeletal:  Left arm cold and pulseless with a capillary refill greater than 6 seconds.   Neurological:  Depressed mental status but able to open her eyes to deep painful stimuli. Protect her  airway with intact gag reflex, moves right upper and bilateral lower extremities to painful stimuli, will not move her left upper extremity.   Skin: Skin is warm and dry. There is pallor.  Psychiatric: She has a normal mood and affect. Judgment normal.  Nursing note and vitals reviewed.  ED Treatments / Results  DIAGNOSTIC STUDIES: Oxygen Saturation is 83% on room air, low by my interpretation.    COORDINATION OF CARE: 9:35 PM Discussed treatment plan with pt at bedside and pt agreed to plan.  Labs (all labs ordered are listed, but only  abnormal results are displayed) Labs Reviewed  CBC WITH DIFFERENTIAL/PLATELET  BASIC METABOLIC PANEL  RAPID URINE DRUG SCREEN, HOSP PERFORMED  URINALYSIS, ROUTINE W REFLEX MICROSCOPIC (NOT AT [facility])  I-STAT CG4 LACTIC ACID, ED  Rosezena SensorI-STAT TROPOININ, ED   Radiology No results found.  Procedures Procedures (including critical care time)  Medications Ordered in ED Medications - No data to display  Initial Impression / Assessment and Plan / ED Course  I have reviewed the triage vital signs and the nursing notes.  Pertinent labs & imaging results that were available during my care of the patient were reviewed by me and considered in my medical decision making (see chart for details).  Clinical Course    The patient has had significant findings in her arm that are concerning for an ischemic limb. On my exam the patient has significant duskiness, mottling, the limb is cold, there is no palpable pulses at either the radial or the antecubital arteries, there is no dopplerable pulse that either of those areas as well. All other extremities are unremarkable however the patient's mental status is such that it is difficult to evaluate her or to get any further history. I initially was concerned for dissection however decided to do a CT angiogram of the left upper extremity to rule out a arterial obstruction, this was positive, there did not appear to be  dissection of the aortic arch. I discussed the case with Dr. Darrick Pennafields of vascular surgery who has [facility]. I have started heparin and a heparin drip, she will go by emergency transport as she has a limb threatening illness and will need emergent intervention this evening.  By 10:30 PM the patient had a significant improvement in her mental status and states that she took 1 additional pill of both her methocarbamol and her antidepressant. She appears much more with it and alert at this time. Her vital signs remained in a stable range, the patient is critically ill  CRITICAL CARE Performed by: Vida RollerBrian D Maeola Mchaney Total critical care time: 35  minutes Critical care time was exclusive of separately billable procedures and treating other patients. Critical care was necessary to treat or prevent imminent or life-threatening deterioration. Critical care was time spent personally by me on the following activities: development of treatment plan with patient and/or surrogate as well as nursing, discussions with consultants, evaluation of patient's response to treatment, examination of patient, obtaining history from patient or surrogate, ordering and performing treatments and interventions, ordering and review of laboratory studies, ordering and review of radiographic studies, pulse oximetry and re-evaluation of patient's condition.   Final Clinical Impressions(s) / ED Diagnoses   Final diagnoses:  Critical ischemia of upper extremity   New Prescriptions New Prescriptions   No medications on file   I personally performed the services described in this documentation, which was scribed in my presence. The recorded information has been reviewed and is accurate.       Eber HongBrian Jehu Mccauslin, MD 12/05/15 2231

## 2015-12-05 NOTE — H&P (Addendum)
Referring Physician: Dr Durward [facility]  Patient name: [name] MRN: [id number] DOB: 1970/11/17 Sex: female  REASON FOR CONSULT: left arm ischemia  HPI: [name] is a 45 y.o. female, with approximately 7 hr history of pain numbness and tingling left upper extremity.  Pt took some muscle relaxants earlier this evening fell asleep and when woke up pain and numbness was present.  She denies history of chest pain or dyspnea.  She has no history of afib but has a son with aortic stenosis and another with afib.  She states recently she has had some decline of her liver and kidneys but does not know any details.  She smokes 2 ppd. She denies history of IV drug abuse.  Other medical problems include chronic back pain hypertension elevated cholesterol.  She stopped taking her cholesterol med.  Past Medical History:  Diagnosis Date  . Anxiety   . Back pain   . Chronic back pain 02/04/13   By patient report  . Depression   . Hypertension   . Laryngitis, chronic   . Panic attack   . Scoliosis   . Ulcer [facility])    Past Surgical History:  Procedure Laterality Date  . BACK SURGERY    . CESAREAN SECTION    . CHOLECYSTECTOMY  11/27/2011   Procedure: LAPAROSCOPIC CHOLECYSTECTOMY;  Surgeon: Fabio Bering, MD;  Location: [facility];  Service: General;  Laterality: N/A;  . NECK SURGERY    . TUBAL LIGATION      Family History  Problem Relation Age of Onset  . Heart disease    . Arthritis    . Lung disease    . Cancer    . Asthma    . Diabetes      SOCIAL HISTORY: Social History   Social History  . Marital status: Divorced    Spouse name: N/A  . Number of children: N/A  . Years of education: N/A   Occupational History  . Not on file.   Social History Main Topics  . Smoking status: Current Every Day Smoker    Packs/day: 2.00    Years: 25.00    Types: Cigarettes  . Smokeless tobacco: Never Used  . Alcohol use No  . Drug use: No  . Sexual activity: Yes    Birth  control/ protection: Surgical   Other Topics Concern  . Not on file   Social History Narrative  . No narrative on file    Allergies  Allergen Reactions  . Penicillins Anaphylaxis  . Ibuprofen Nausea And Vomiting    GI upset, Shakes  . Aspirin Nausea And Vomiting    Gi upset    No current facility-administered medications for this encounter.    Current Outpatient Prescriptions  Medication Sig Dispense Refill  . albuterol (PROVENTIL HFA;VENTOLIN HFA) 108 (90 BASE) MCG/ACT inhaler Inhale 2 puffs into the lungs every 4 (four) hours as needed for wheezing or shortness of breath. 1 Inhaler 0  . ALPRAZolam (XANAX) 1 MG tablet Take 2 mg by mouth 4 (four) times daily.     Marland Kitchen amphetamine-dextroamphetamine (ADDERALL) 10 MG tablet Take 10 mg by mouth 2 (two) times daily with a meal.    . azithromycin (ZITHROMAX) 250 MG tablet Take 1 tablet (250 mg total) by mouth daily. Take first 2 tablets together, then 1 every day until finished. (Patient not taking: Reported on 08/15/2014) 6 tablet 0  . celecoxib (CELEBREX) 100 MG capsule Take 100 mg by  mouth daily.    . citalopram (CELEXA) 20 MG tablet Take 20 mg by mouth daily.     Marland Kitchen. doxycycline (VIBRAMYCIN) 100 MG capsule Take 1 capsule (100 mg total) by mouth 2 (two) times daily. 14 capsule 0  . HYDROcodone-acetaminophen (NORCO/VICODIN) 5-325 MG per tablet Take 1 tablet by mouth every 4 (four) hours as needed. (Patient not taking: Reported on 08/15/2014) 15 tablet 0  . lisinopril-hydrochlorothiazide (PRINZIDE,ZESTORETIC) 20-25 MG per tablet Take 1 tablet by mouth daily.     . methocarbamol (ROBAXIN) 750 MG tablet Take 750 mg by mouth 4 (four) times daily.      Marland Kitchen. oxyCODONE-acetaminophen (PERCOCET) 7.5-325 MG per tablet TK 1 T PO TID PRN P  0  . pantoprazole (PROTONIX) 40 MG tablet Take 40 mg by mouth 2 (two) times daily.     . potassium chloride (K-DUR) 10 MEQ tablet Take 10 mEq by mouth daily.    . predniSONE (DELTASONE) 20 MG tablet Take 3 tablets (60 mg  total) by mouth daily. (Patient not taking: Reported on 08/15/2014) 15 tablet 0  . pregabalin (LYRICA) 100 MG capsule Take 100 mg by mouth 3 (three) times daily.    . sucralfate (CARAFATE) 1 G tablet Take 1 tablet (1 g total) by mouth 4 (four) times daily -  with meals and at bedtime. 60 tablet 1    ROS:   General:  No weight loss, Fever, chills  HEENT: No recent headaches, no nasal bleeding, no visual changes, no sore throat  Neurologic: No dizziness, blackouts, seizures. No recent symptoms of stroke or mini- stroke. No recent episodes of slurred speech, or temporary blindness.  Cardiac: No recent episodes of chest pain/pressure, no shortness of breath at rest.  + shortness of breath with exertion.  Denies history of atrial fibrillation or irregular heartbeat  Vascular: No history of rest pain in feet.  No history of claudication.  No history of non-healing ulcer, No history of DVT   Pulmonary: No home oxygen, no productive cough, no hemoptysis,  No asthma or wheezing  Musculoskeletal:  [X]  Arthritis, [X]  Low back pain,  [X]  Joint pain  Hematologic:No history of hypercoagulable state.  No history of easy bleeding.  No history of anemia  Gastrointestinal: No hematochezia or melena,  No gastroesophageal reflux, no trouble swallowing  Urinary: [X]  chronic Kidney disease, [ ]  on HD - [ ]  MWF or [ ]  TTHS, [ ]  Burning with urination, [ ]  Frequent urination, [ ]  Difficulty urinating;   Skin: No rashes  Psychological: No history of anxiety,  No history of depression   Physical Examination  Vitals:   12/05/15 2145 12/05/15 2200 12/05/15 2215 12/05/15 2230  BP:  148/73  149/77  Pulse: 77 (!) 36 (!) 53 80  Resp: 21 23 23 26   SpO2: 93% 97% (!) 73% 99%  Weight:      Height:        Body mass index is 30.9 kg/m.  General:  Alert and oriented, no acute distress HEENT: Normal Neck: No bruit or JVD Pulmonary: Clear to auscultation bilaterally Cardiac: Regular Rate and Rhythm    Abdomen: Soft, non-tender, non-distended, no mass, obese Skin: No rash, left arm cool pale from mid upper arm into hand Extremity Pulses:  2+ radial, brachial, right side, absent on left side from axilla down, 2+ femoral, dorsalis pedis, pulses bilaterally Musculoskeletal: No deformity or edema  Neurologic: Upper and lower extremity motor 5/5 and symmetric, sensation slightly decreased to light touch volar forearm on  left  DATA:  BMET    Component Value Date/Time   NA 137 12/05/2015 2148   K 4.0 12/05/2015 2148   CL 101 12/05/2015 2148   CO2 20 (L) 12/05/2015 2121   GLUCOSE 174 (H) 12/05/2015 2148   BUN 14 12/05/2015 2148   CREATININE 1.30 (H) 12/05/2015 2148   CALCIUM 9.7 12/05/2015 2121   GFRNONAA 46 (L) 12/05/2015 2121   GFRAA 53 (L) 12/05/2015 2121    CBC    Component Value Date/Time   WBC 20.8 (H) 12/05/2015 2152   RBC 5.62 (H) 12/05/2015 2152   HGB 19.6 (H) 12/05/2015 2152   HCT 50.5 (H) 12/05/2015 2152   PLT 314 12/05/2015 2152   MCV 89.9 12/05/2015 2152   MCH 34.1 (H) 12/05/2015 2152   MCHC 37.5 (H) 12/05/2015 2152   RDW 12.7 12/05/2015 2152   LYMPHSABS 2.3 12/05/2015 2152   MONOABS 1.0 12/05/2015 2152   EOSABS 0.0 12/05/2015 2152   BASOSABS 0.0 12/05/2015 2152   CTA: arch great vessels desc thoracic aorta patent without dissection, SMA celiac patent, infrarenal aorta iliacs common femorals patent.  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  No real flow in forearm  EKG sinus rhythm  ASSESSMENT:  Acute ischemia left arm most likely embolic although currently sinus rhythm.  Polycythemia may also be contributing will hydrate to see if this improves   PLAN: Continue heparin.  Left brachial embolectomy. Check urine drug screen. Telemetry.  Risk benefit possible complications procedure details discussed with patient.   Including but not limited to permanent neuro deficit bleeding infection possible requirement for fasciotomy   Fabienne Bruns, MD [facility] [place] Office: [phone] Pager: [phone]

## 2015-12-05 NOTE — ED Triage Notes (Signed)
Boyfriend claims pt took double dose of Celexa and muscle relaxer.  Been drowsy for about an hour.  Unable to arouse, pt is breathing on her own,  Per boyfriend pt c/o left arm pain an hour ago.  Boyfriend denies ETOH or drug use

## 2015-12-06 ENCOUNTER — Emergency Department (HOSPITAL_COMMUNITY): Payer: Medicaid Other | Admitting: Anesthesiology

## 2015-12-06 ENCOUNTER — Encounter (HOSPITAL_COMMUNITY)
Admission: EM | Disposition: A | Discharge status: Home / Self Care | Payer: Self-pay | Source: Home / Self Care | Attending: Vascular Surgery

## 2015-12-06 DIAGNOSIS — G8929 Other chronic pain: Secondary | ICD-10-CM | POA: Diagnosis present

## 2015-12-06 DIAGNOSIS — E876 Hypokalemia: Secondary | ICD-10-CM | POA: Diagnosis present

## 2015-12-06 DIAGNOSIS — I10 Essential (primary) hypertension: Secondary | ICD-10-CM | POA: Diagnosis present

## 2015-12-06 DIAGNOSIS — F419 Anxiety disorder, unspecified: Secondary | ICD-10-CM | POA: Diagnosis present

## 2015-12-06 DIAGNOSIS — I998 Other disorder of circulatory system: Secondary | ICD-10-CM | POA: Diagnosis present

## 2015-12-06 DIAGNOSIS — I742 Embolism and thrombosis of arteries of the upper extremities: Secondary | ICD-10-CM | POA: Diagnosis present

## 2015-12-06 DIAGNOSIS — M549 Dorsalgia, unspecified: Secondary | ICD-10-CM | POA: Diagnosis present

## 2015-12-06 DIAGNOSIS — F329 Major depressive disorder, single episode, unspecified: Secondary | ICD-10-CM | POA: Diagnosis present

## 2015-12-06 DIAGNOSIS — Z6832 Body mass index (BMI) 32.0-32.9, adult: Secondary | ICD-10-CM | POA: Diagnosis not present

## 2015-12-06 DIAGNOSIS — E7800 Pure hypercholesterolemia, unspecified: Secondary | ICD-10-CM | POA: Diagnosis present

## 2015-12-06 DIAGNOSIS — F17210 Nicotine dependence, cigarettes, uncomplicated: Secondary | ICD-10-CM | POA: Diagnosis present

## 2015-12-06 DIAGNOSIS — Z825 Family history of asthma and other chronic lower respiratory diseases: Secondary | ICD-10-CM | POA: Diagnosis not present

## 2015-12-06 DIAGNOSIS — M419 Scoliosis, unspecified: Secondary | ICD-10-CM | POA: Diagnosis present

## 2015-12-06 DIAGNOSIS — E669 Obesity, unspecified: Secondary | ICD-10-CM | POA: Diagnosis present

## 2015-12-06 DIAGNOSIS — T43221A Poisoning by selective serotonin reuptake inhibitors, accidental (unintentional), initial encounter: Secondary | ICD-10-CM | POA: Diagnosis present

## 2015-12-06 DIAGNOSIS — D751 Secondary polycythemia: Secondary | ICD-10-CM | POA: Diagnosis present

## 2015-12-06 DIAGNOSIS — Z833 Family history of diabetes mellitus: Secondary | ICD-10-CM | POA: Diagnosis not present

## 2015-12-06 DIAGNOSIS — M79602 Pain in left arm: Secondary | ICD-10-CM | POA: Diagnosis present

## 2015-12-06 DIAGNOSIS — Z79891 Long term (current) use of opiate analgesic: Secondary | ICD-10-CM | POA: Diagnosis not present

## 2015-12-06 HISTORY — PX: EMBOLECTOMY: SHX44

## 2015-12-06 LAB — RAPID URINE DRUG SCREEN, HOSP PERFORMED
AMPHETAMINES: NOT DETECTED
BENZODIAZEPINES: POSITIVE — AB
Barbiturates: NOT DETECTED
COCAINE: NOT DETECTED
OPIATES: NOT DETECTED
Tetrahydrocannabinol: POSITIVE — AB

## 2015-12-06 LAB — I-STAT BETA HCG BLOOD, ED (MC, WL, AP ONLY)

## 2015-12-06 LAB — HEPARIN LEVEL (UNFRACTIONATED)
HEPARIN UNFRACTIONATED: 0.22 [IU]/mL — AB (ref 0.30–0.70)
HEPARIN UNFRACTIONATED: 0.2 [IU]/mL — AB (ref 0.30–0.70)

## 2015-12-06 LAB — MRSA PCR SCREENING: MRSA by PCR: NEGATIVE

## 2015-12-06 SURGERY — EMBOLECTOMY
Anesthesia: General | Site: Arm Upper | Laterality: Left

## 2015-12-06 MED ORDER — AMPHETAMINE-DEXTROAMPHETAMINE 10 MG PO TABS
10.0000 mg | ORAL_TABLET | ORAL | Status: DC
  Administered 2015-12-06 – 2015-12-07 (×5): 10 mg via ORAL
  Filled 2015-12-06 (×5): qty 1

## 2015-12-06 MED ORDER — ONDANSETRON HCL 4 MG/2ML IJ SOLN
INTRAMUSCULAR | Status: AC
  Filled 2015-12-06: qty 2

## 2015-12-06 MED ORDER — SODIUM CHLORIDE 0.9 % IJ SOLN
INTRAMUSCULAR | Status: AC
  Filled 2015-12-06: qty 10

## 2015-12-06 MED ORDER — ALBUTEROL SULFATE (2.5 MG/3ML) 0.083% IN NEBU: 2.5000 mg | INHALATION_SOLUTION | RESPIRATORY_TRACT | Status: DC | PRN

## 2015-12-06 MED ORDER — DEXTROSE-NACL 5-0.45 % IV SOLN
INTRAVENOUS | Status: DC
  Administered 2015-12-06 – 2015-12-07 (×5): via INTRAVENOUS

## 2015-12-06 MED ORDER — SUGAMMADEX SODIUM 500 MG/5ML IV SOLN
INTRAVENOUS | Status: AC
  Filled 2015-12-06: qty 5

## 2015-12-06 MED ORDER — LIDOCAINE HCL (CARDIAC) 20 MG/ML IV SOLN
INTRAVENOUS | Status: DC | PRN
  Administered 2015-12-06: 100 mg via INTRATRACHEAL

## 2015-12-06 MED ORDER — PANTOPRAZOLE SODIUM 40 MG PO TBEC
40.0000 mg | DELAYED_RELEASE_TABLET | Freq: Every day | ORAL | Status: DC
  Filled 2015-12-06: qty 1

## 2015-12-06 MED ORDER — ALUM & MAG HYDROXIDE-SIMETH 200-200-20 MG/5ML PO SUSP: 15.0000 mL | ORAL | Status: DC | PRN

## 2015-12-06 MED ORDER — ONDANSETRON HCL 4 MG/2ML IJ SOLN: 4.0000 mg | Freq: Four times a day (QID) | INTRAMUSCULAR | Status: DC | PRN

## 2015-12-06 MED ORDER — MIDAZOLAM HCL 2 MG/2ML IJ SOLN
INTRAMUSCULAR | Status: DC | PRN
  Administered 2015-12-06: 2 mg via INTRAVENOUS

## 2015-12-06 MED ORDER — EPHEDRINE SULFATE 50 MG/ML IJ SOLN
INTRAMUSCULAR | Status: DC | PRN
  Administered 2015-12-06: 15 mg via INTRAVENOUS
  Administered 2015-12-06: 10 mg via INTRAVENOUS

## 2015-12-06 MED ORDER — GUAIFENESIN-DM 100-10 MG/5ML PO SYRP: 15.0000 mL | ORAL_SOLUTION | ORAL | Status: DC | PRN

## 2015-12-06 MED ORDER — SUCRALFATE 1 G PO TABS
1.0000 g | ORAL_TABLET | Freq: Three times a day (TID) | ORAL | Status: DC
  Administered 2015-12-06 – 2015-12-07 (×5): 1 g via ORAL
  Filled 2015-12-06 (×5): qty 1

## 2015-12-06 MED ORDER — OXYCODONE-ACETAMINOPHEN 5-325 MG PO TABS
1.0000 | ORAL_TABLET | ORAL | Status: DC | PRN
  Administered 2015-12-06 – 2015-12-07 (×5): 2 via ORAL
  Filled 2015-12-06 (×6): qty 2

## 2015-12-06 MED ORDER — SODIUM CHLORIDE 0.9 % IV SOLN: 500.0000 mL | Freq: Once | INTRAVENOUS | Status: DC | PRN

## 2015-12-06 MED ORDER — HEPARIN (PORCINE) IN NACL 100-0.45 UNIT/ML-% IJ SOLN
1150.0000 [IU]/h | INTRAMUSCULAR | Status: DC
  Administered 2015-12-06 (×2): 1150 [IU]/h via INTRAVENOUS
  Filled 2015-12-06: qty 250

## 2015-12-06 MED ORDER — CELECOXIB 100 MG PO CAPS
100.0000 mg | ORAL_CAPSULE | Freq: Every day | ORAL | Status: DC
  Administered 2015-12-06 – 2015-12-07 (×2): 100 mg via ORAL
  Filled 2015-12-06 (×2): qty 1

## 2015-12-06 MED ORDER — MORPHINE SULFATE (PF) 2 MG/ML IV SOLN
2.0000 mg | INTRAVENOUS | Status: DC | PRN
  Administered 2015-12-06 (×5): 4 mg via INTRAVENOUS
  Filled 2015-12-06 (×5): qty 2

## 2015-12-06 MED ORDER — HEPARIN SODIUM (PORCINE) 1000 UNIT/ML IJ SOLN
INTRAMUSCULAR | Status: DC | PRN
  Administered 2015-12-06: 5000 [IU] via INTRAVENOUS

## 2015-12-06 MED ORDER — HEPARIN (PORCINE) IN NACL 100-0.45 UNIT/ML-% IJ SOLN
INTRAMUSCULAR | Status: DC | PRN
  Administered 2015-12-06: 980 [IU]/h via INTRAVENOUS

## 2015-12-06 MED ORDER — HYDRALAZINE HCL 20 MG/ML IJ SOLN: 5.0000 mg | INTRAMUSCULAR | Status: DC | PRN

## 2015-12-06 MED ORDER — POTASSIUM CHLORIDE ER 10 MEQ PO TBCR
10.0000 meq | EXTENDED_RELEASE_TABLET | Freq: Every day | ORAL | Status: DC
  Administered 2015-12-06 – 2015-12-07 (×2): 10 meq via ORAL
  Filled 2015-12-06 (×5): qty 1

## 2015-12-06 MED ORDER — PREGABALIN 75 MG PO CAPS
150.0000 mg | ORAL_CAPSULE | Freq: Three times a day (TID) | ORAL | Status: DC
  Administered 2015-12-06 – 2015-12-07 (×5): 150 mg via ORAL
  Filled 2015-12-06 (×6): qty 2

## 2015-12-06 MED ORDER — MIDAZOLAM HCL 2 MG/2ML IJ SOLN
INTRAMUSCULAR | Status: AC
  Filled 2015-12-06: qty 2

## 2015-12-06 MED ORDER — CITALOPRAM HYDROBROMIDE 20 MG PO TABS
20.0000 mg | ORAL_TABLET | Freq: Every day | ORAL | Status: DC
  Administered 2015-12-06 – 2015-12-07 (×2): 20 mg via ORAL
  Filled 2015-12-06 (×2): qty 1

## 2015-12-06 MED ORDER — MAGNESIUM SULFATE 2 GM/50ML IV SOLN
2.0000 g | Freq: Every day | INTRAVENOUS | Status: DC | PRN
  Filled 2015-12-06: qty 50

## 2015-12-06 MED ORDER — POTASSIUM CHLORIDE CRYS ER 20 MEQ PO TBCR
20.0000 meq | EXTENDED_RELEASE_TABLET | Freq: Every day | ORAL | Status: AC | PRN
  Administered 2015-12-07: 40 meq via ORAL
  Filled 2015-12-06: qty 2

## 2015-12-06 MED ORDER — HYDROCHLOROTHIAZIDE 25 MG PO TABS
25.0000 mg | ORAL_TABLET | Freq: Every day | ORAL | Status: DC
  Administered 2015-12-06 – 2015-12-07 (×2): 25 mg via ORAL
  Filled 2015-12-06 (×2): qty 1

## 2015-12-06 MED ORDER — LISINOPRIL 10 MG PO TABS
20.0000 mg | ORAL_TABLET | Freq: Every day | ORAL | Status: DC
  Administered 2015-12-06 – 2015-12-07 (×2): 20 mg via ORAL
  Filled 2015-12-06: qty 1
  Filled 2015-12-06: qty 2

## 2015-12-06 MED ORDER — PANTOPRAZOLE SODIUM 40 MG PO TBEC
40.0000 mg | DELAYED_RELEASE_TABLET | Freq: Two times a day (BID) | ORAL | Status: DC
  Administered 2015-12-06 – 2015-12-07 (×4): 40 mg via ORAL
  Filled 2015-12-06 (×3): qty 1

## 2015-12-06 MED ORDER — HEPARIN BOLUS VIA INFUSION
1000.0000 [IU] | Freq: Once | INTRAVENOUS | Status: AC
  Administered 2015-12-06: 1000 [IU] via INTRAVENOUS
  Filled 2015-12-06: qty 1000

## 2015-12-06 MED ORDER — ONDANSETRON HCL 4 MG/2ML IJ SOLN
INTRAMUSCULAR | Status: DC | PRN
  Administered 2015-12-06: 4 mg via INTRAVENOUS

## 2015-12-06 MED ORDER — HEPARIN (PORCINE) IN NACL 100-0.45 UNIT/ML-% IJ SOLN
1550.0000 [IU]/h | INTRAMUSCULAR | Status: DC
  Administered 2015-12-07: 1550 [IU]/h via INTRAVENOUS
  Filled 2015-12-06: qty 250

## 2015-12-06 MED ORDER — PHENYLEPHRINE HCL 10 MG/ML IJ SOLN
INTRAVENOUS | Status: DC | PRN
  Administered 2015-12-06: 25 ug/min via INTRAVENOUS

## 2015-12-06 MED ORDER — LISINOPRIL-HYDROCHLOROTHIAZIDE 20-25 MG PO TABS: 1.0000 | ORAL_TABLET | Freq: Every day | ORAL | Status: DC

## 2015-12-06 MED ORDER — FENTANYL CITRATE (PF) 100 MCG/2ML IJ SOLN
INTRAMUSCULAR | Status: DC | PRN
  Administered 2015-12-06: 150 ug via INTRAVENOUS
  Administered 2015-12-06: 50 ug via INTRAVENOUS

## 2015-12-06 MED ORDER — HYDROMORPHONE HCL 1 MG/ML IJ SOLN
INTRAMUSCULAR | Status: AC
  Filled 2015-12-06: qty 0.5

## 2015-12-06 MED ORDER — IOPAMIDOL (ISOVUE-300) INJECTION 61%
INTRAVENOUS | Status: AC
  Filled 2015-12-06: qty 50

## 2015-12-06 MED ORDER — ROCURONIUM BROMIDE 100 MG/10ML IV SOLN
INTRAVENOUS | Status: DC | PRN
  Administered 2015-12-06: 50 mg via INTRAVENOUS

## 2015-12-06 MED ORDER — SUCCINYLCHOLINE CHLORIDE 20 MG/ML IJ SOLN
INTRAMUSCULAR | Status: DC | PRN
  Administered 2015-12-06: 140 mg via INTRAVENOUS

## 2015-12-06 MED ORDER — ACETAMINOPHEN 325 MG RE SUPP: 325.0000 mg | RECTAL | Status: DC | PRN

## 2015-12-06 MED ORDER — 0.9 % SODIUM CHLORIDE (POUR BTL) OPTIME
TOPICAL | Status: DC | PRN
  Administered 2015-12-06: 1000 mL

## 2015-12-06 MED ORDER — MEPERIDINE HCL 25 MG/ML IJ SOLN: 6.2500 mg | INTRAMUSCULAR | Status: DC | PRN

## 2015-12-06 MED ORDER — SODIUM CHLORIDE 0.9 % IV SOLN
INTRAVENOUS | Status: DC | PRN
  Administered 2015-12-06: 500 mL

## 2015-12-06 MED ORDER — PREGABALIN 100 MG PO CAPS: 100.0000 mg | ORAL_CAPSULE | Freq: Three times a day (TID) | ORAL | Status: DC

## 2015-12-06 MED ORDER — ONDANSETRON HCL 4 MG/2ML IJ SOLN
4.0000 mg | Freq: Once | INTRAMUSCULAR | Status: AC | PRN
  Administered 2015-12-06: 4 mg via INTRAVENOUS

## 2015-12-06 MED ORDER — ALPRAZOLAM 0.5 MG PO TABS
1.0000 mg | ORAL_TABLET | Freq: Every evening | ORAL | Status: DC | PRN
  Administered 2015-12-06: 1 mg via ORAL
  Filled 2015-12-06: qty 2

## 2015-12-06 MED ORDER — FENTANYL CITRATE (PF) 100 MCG/2ML IJ SOLN
INTRAMUSCULAR | Status: AC
  Filled 2015-12-06: qty 4

## 2015-12-06 MED ORDER — METOPROLOL TARTRATE 5 MG/5ML IV SOLN: 2.0000 mg | INTRAVENOUS | Status: DC | PRN

## 2015-12-06 MED ORDER — PROPOFOL 10 MG/ML IV BOLUS
INTRAVENOUS | Status: AC
  Filled 2015-12-06: qty 20

## 2015-12-06 MED ORDER — VANCOMYCIN HCL 1000 MG IV SOLR
INTRAVENOUS | Status: DC | PRN
  Administered 2015-12-06: 1000 mg via INTRAVENOUS

## 2015-12-06 MED ORDER — LIDOCAINE 2% (20 MG/ML) 5 ML SYRINGE
INTRAMUSCULAR | Status: AC
  Filled 2015-12-06: qty 5

## 2015-12-06 MED ORDER — SUCCINYLCHOLINE CHLORIDE 200 MG/10ML IV SOSY
PREFILLED_SYRINGE | INTRAVENOUS | Status: AC
  Filled 2015-12-06: qty 10

## 2015-12-06 MED ORDER — PHENOL 1.4 % MT LIQD: 1.0000 | OROMUCOSAL | Status: DC | PRN

## 2015-12-06 MED ORDER — SUGAMMADEX SODIUM 500 MG/5ML IV SOLN
INTRAVENOUS | Status: DC | PRN
  Administered 2015-12-06: 350 mg via INTRAVENOUS

## 2015-12-06 MED ORDER — HEPARIN (PORCINE) IN NACL 100-0.45 UNIT/ML-% IJ SOLN: 1000.0000 [IU]/h | INTRAMUSCULAR | Status: DC

## 2015-12-06 MED ORDER — VANCOMYCIN HCL 1000 MG IV SOLR: 1000.0000 mg | INTRAVENOUS | Status: DC

## 2015-12-06 MED ORDER — DOCUSATE SODIUM 100 MG PO CAPS
100.0000 mg | ORAL_CAPSULE | Freq: Every day | ORAL | Status: DC
  Administered 2015-12-07: 100 mg via ORAL
  Filled 2015-12-06 (×2): qty 1

## 2015-12-06 MED ORDER — LACTATED RINGERS IV SOLN
INTRAVENOUS | Status: DC | PRN
  Administered 2015-12-06 (×2): via INTRAVENOUS

## 2015-12-06 MED ORDER — VANCOMYCIN HCL IN DEXTROSE 1-5 GM/200ML-% IV SOLN
1000.0000 mg | Freq: Once | INTRAVENOUS | Status: AC
  Administered 2015-12-06: 1000 mg via INTRAVENOUS
  Filled 2015-12-06: qty 200

## 2015-12-06 MED ORDER — ACETAMINOPHEN 325 MG PO TABS
325.0000 mg | ORAL_TABLET | ORAL | Status: DC | PRN
  Administered 2015-12-06 – 2015-12-07 (×2): 650 mg via ORAL
  Filled 2015-12-06 (×2): qty 2

## 2015-12-06 MED ORDER — HYDROMORPHONE HCL 1 MG/ML IJ SOLN
0.2500 mg | INTRAMUSCULAR | Status: DC | PRN
  Administered 2015-12-06 (×2): 0.25 mg via INTRAVENOUS

## 2015-12-06 MED ORDER — PROPOFOL 10 MG/ML IV BOLUS
INTRAVENOUS | Status: DC | PRN
  Administered 2015-12-06 (×2): 200 mg via INTRAVENOUS

## 2015-12-06 MED ORDER — LABETALOL HCL 5 MG/ML IV SOLN: 10.0000 mg | INTRAVENOUS | Status: DC | PRN

## 2015-12-06 SURGICAL SUPPLY — 54 items
ADH SKN CLS APL DERMABOND .7 (GAUZE/BANDAGES/DRESSINGS) ×1
BANDAGE ESMARK 6X9 LF (GAUZE/BANDAGES/DRESSINGS) IMPLANT
BNDG CMPR 9X6 STRL LF SNTH (GAUZE/BANDAGES/DRESSINGS)
BNDG ESMARK 6X9 LF (GAUZE/BANDAGES/DRESSINGS)
CANISTER SUCTION 2500CC (MISCELLANEOUS) ×2 IMPLANT
CANNULA VESSEL 3MM 2 BLNT TIP (CANNULA) ×1 IMPLANT
CATH EMB 3FR 80CM (CATHETERS) ×1 IMPLANT
CATH EMB 4FR 80CM (CATHETERS) ×1 IMPLANT
CATH EMB 5FR 80CM (CATHETERS) IMPLANT
CLIP TI MEDIUM 24 (CLIP) ×2 IMPLANT
CLIP TI WIDE RED SMALL 24 (CLIP) ×2 IMPLANT
CONT SPEC 4OZ CLIKSEAL STRL BL (MISCELLANEOUS) ×2 IMPLANT
CUFF TOURNIQUET SINGLE 24IN (TOURNIQUET CUFF) IMPLANT
CUFF TOURNIQUET SINGLE 34IN LL (TOURNIQUET CUFF) IMPLANT
CUFF TOURNIQUET SINGLE 44IN (TOURNIQUET CUFF) IMPLANT
DECANTER SPIKE VIAL GLASS SM (MISCELLANEOUS) IMPLANT
DERMABOND ADVANCED (GAUZE/BANDAGES/DRESSINGS) ×1
DERMABOND ADVANCED .7 DNX12 (GAUZE/BANDAGES/DRESSINGS) IMPLANT
DRAIN SNY 10X20 3/4 PERF (WOUND CARE) IMPLANT
DRAPE X-RAY CASS 24X20 (DRAPES) IMPLANT
DRSG COVADERM 4X8 (GAUZE/BANDAGES/DRESSINGS) IMPLANT
ELECT REM PT RETURN 9FT ADLT (ELECTROSURGICAL) ×2
ELECTRODE REM PT RTRN 9FT ADLT (ELECTROSURGICAL) ×1 IMPLANT
EVACUATOR SILICONE 100CC (DRAIN) IMPLANT
GLOVE BIO SURGEON STRL SZ7.5 (GLOVE) ×2 IMPLANT
GLOVE BIOGEL PI IND STRL 6.5 (GLOVE) IMPLANT
GLOVE BIOGEL PI INDICATOR 6.5 (GLOVE) ×3
GLOVE ECLIPSE 6.5 STRL STRAW (GLOVE) ×1 IMPLANT
GOWN STRL REUS W/ TWL LRG LVL3 (GOWN DISPOSABLE) ×3 IMPLANT
GOWN STRL REUS W/TWL LRG LVL3 (GOWN DISPOSABLE) ×6
KIT BASIN OR (CUSTOM PROCEDURE TRAY) ×2 IMPLANT
KIT ROOM TURNOVER OR (KITS) ×2 IMPLANT
NS IRRIG 1000ML POUR BTL (IV SOLUTION) ×4 IMPLANT
PACK PERIPHERAL VASCULAR (CUSTOM PROCEDURE TRAY) ×2 IMPLANT
PAD ARMBOARD 7.5X6 YLW CONV (MISCELLANEOUS) ×4 IMPLANT
PADDING CAST COTTON 6X4 STRL (CAST SUPPLIES) IMPLANT
SET COLLECT BLD 21X3/4 12 (NEEDLE) IMPLANT
SPONGE SURGIFOAM ABS GEL 100 (HEMOSTASIS) IMPLANT
STAPLER VISISTAT 35W (STAPLE) IMPLANT
STOCKINETTE 4X48 STRL (DRAPES) ×1 IMPLANT
STOPCOCK 4 WAY LG BORE MALE ST (IV SETS) IMPLANT
SUT PROLENE 5 0 C 1 24 (SUTURE) ×2 IMPLANT
SUT PROLENE 6 0 CC (SUTURE) ×2 IMPLANT
SUT VIC AB 2-0 CTX 36 (SUTURE) ×1 IMPLANT
SUT VIC AB 3-0 SH 27 (SUTURE) ×2
SUT VIC AB 3-0 SH 27X BRD (SUTURE) ×1 IMPLANT
SUT VIC AB 4-0 PS2 27 (SUTURE) ×1 IMPLANT
SYR 3ML LL SCALE MARK (SYRINGE) ×2 IMPLANT
SYRINGE 3CC LL L/F (MISCELLANEOUS) ×1 IMPLANT
TRAY CATH 16FR W/PLASTIC CATH (SET/KITS/TRAYS/PACK) ×1 IMPLANT
TRAY FOLEY W/METER SILVER 16FR (SET/KITS/TRAYS/PACK) ×1 IMPLANT
TUBING EXTENTION W/L.L. (IV SETS) IMPLANT
UNDERPAD 30X30 (UNDERPADS AND DIAPERS) ×2 IMPLANT
WATER STERILE IRR 1000ML POUR (IV SOLUTION) ×2 IMPLANT

## 2015-12-06 NOTE — Progress Notes (Signed)
Patient transferred by Rn to 2w11 by bed. Pt's significant other notified of transfer per pt request.

## 2015-12-06 NOTE — ED Notes (Signed)
Pt leaving ER to short stay 36 (OR holding) via stretcher with RN

## 2015-12-06 NOTE — Progress Notes (Addendum)
  Progress Note    12/06/2015 8:09 AM Day of Surgery  Subjective:  C/o pain  Tm 101.7 now afebrile HR  70's-80's NSR 110's-140's systolic 99% 2LO2NC  Vitals:   12/06/15 0321 12/06/15 0733  BP: (!) 118/55 139/79  Pulse: 76 72  Resp: 14 17  Temp: 98.1 F (36.7 C) 98.3 F (36.8 C)    Physical Exam: Cardiac:  Regular Lungs:  Non labored Incisions:  Clean and dry Extremities:  pt with palpable left radial pulse and brisk doppler flow in the radial/ulnar and palmar arch.  Forearm is soft.     CBC    Component Value Date/Time   WBC 20.8 (H) 12/05/2015 2152   RBC 5.62 (H) 12/05/2015 2152   HGB 19.6 (H) 12/05/2015 2152   HCT 50.5 (H) 12/05/2015 2152   PLT 314 12/05/2015 2152   MCV 89.9 12/05/2015 2152   MCH 34.1 (H) 12/05/2015 2152   MCHC 37.5 (H) 12/05/2015 2152   RDW 12.7 12/05/2015 2152   LYMPHSABS 2.3 12/05/2015 2152   MONOABS 1.0 12/05/2015 2152   EOSABS 0.0 12/05/2015 2152   BASOSABS 0.0 12/05/2015 2152    BMET    Component Value Date/Time   NA 137 12/05/2015 2148   K 4.0 12/05/2015 2148   CL 101 12/05/2015 2148   CO2 20 (L) 12/05/2015 2121   GLUCOSE 174 (H) 12/05/2015 2148   BUN 14 12/05/2015 2148   CREATININE 1.30 (H) 12/05/2015 2148   CALCIUM 9.7 12/05/2015 2121   GFRNONAA 46 (L) 12/05/2015 2121   GFRAA 53 (L) 12/05/2015 2121    INR No results found for: INR   Intake/Output Summary (Last 24 hours) at 12/06/15 0809 Last data filed at 12/06/15 0235  Gross per 24 hour  Intake             1000 ml  Output              250 ml  Net              750 ml     Assessment:  45 y.o. female is s/p:   Left Brachial ulnar and radial artery embolectomy  Day of Surgery   Plan: -pt with palpable left radial pulse and brisk doppler flow in the radial/ulnar and palmar arch.  Forearm is soft.  Motor improved from pre op per pt.  Sensation is in tact.  -continue heparin gtt -will need echo (ordered) -labs pending this am -fever overnight with elevated  wbc yesterday.  Atypical lymphocytes on diff.   -vanc and heparin per pharmacy  Doreatha Massed, PA-C [facility] [phone] 12/06/2015 8:09 AM  Agree with above.  Fever most likely atelectasis.  2+ radial pink warm left hand, still with some numbness and tingling. Forearm soft. Follow up labs later today Continue to hydrate for now with polycythemia yesterday ECHO pending. Will decide on long term anticoagulation based on ECHO.  Pt with + drug test for [facility] and I have some concerns about reliability and compliance with oral anticoagulation.  This was discussed with pt.  She also needs to quit smoking  Transfer 2w  Fabienne Bruns, MD [facility] [place] Office: [phone] Pager: [phone]

## 2015-12-06 NOTE — Progress Notes (Signed)
ANTICOAGULATION CONSULT NOTE - Follow Up Consult  Pharmacy Consult for Heparin Indication: s/p brachial artery embolectomy  Allergies  Allergen Reactions  . Penicillins Anaphylaxis    Has patient had a PCN reaction causing immediate rash, facial/tongue/throat swelling, SOB or lightheadedness with hypotension: No Has patient had a PCN reaction causing severe rash involving mucus membranes or skin necrosis: No Has patient had a PCN reaction that required hospitalization No Has patient had a PCN reaction occurring within the last 10 years: No If all of the above answers are "NO", then may proceed with Cephalosporin use.   . Ibuprofen Nausea And Vomiting    GI upset, Shakes  . Aspirin Nausea And Vomiting    Gi upset    Patient Measurements: Height: 5\' 2"  (157.5 cm) Weight: 179 lb 14.3 oz (81.6 kg) IBW/kg (Calculated) : 50.1 Heparin Dosing Weight: 62kg  Vital Signs: Temp: 98.1 F (36.7 C) (11/24 1637) Temp Source: Oral (11/24 1637) BP: 126/54 (11/24 1637) Pulse Rate: 66 (11/24 1637)  Labs:  Recent Labs  12/05/15 2121 12/05/15 2148 12/05/15 2152 12/06/15 0954 12/06/15 1826  HGB  --  18.0* 19.6*  --   --   HCT  --  53.0* 50.5*  --   --   PLT  --   --  314  --   --   HEPARINUNFRC  --   --   --  0.22* 0.20*  CREATININE 1.38* 1.30*  --   --   --   TROPONINI <0.03  --   --   --   --     Estimated Creatinine Clearance: 54.7 mL/min (by C-G formula based on SCr of 1.3 mg/dL (H)).   Medications:  Heparin @ 1150 units/hr  Assessment: 45yof continues on heparin for left arm ischemia s/p brachial artery embolectomy. Heparin level remains low at 0.20. No issues with line/infusion per RN.   Goal of Therapy:  Heparin level 0.3-0.7 units/ml Monitor platelets by anticoagulation protocol: Yes   Plan:  1) Increase heparin to 1350 units/hr 2) Check another 6 hour heparin level  Fredrik Rigger 12/06/2015,7:57 PM

## 2015-12-06 NOTE — Progress Notes (Signed)
ANTICOAGULATION CONSULT NOTE - Initial Consult  Pharmacy Consult for Heparin/Vancomycin Indication: S/P brachial artery embolectomy/Surgical Prophylaxis    Allergies  Allergen Reactions  . Penicillins Anaphylaxis    Has patient had a PCN reaction causing immediate rash, facial/tongue/throat swelling, SOB or lightheadedness with hypotension: No Has patient had a PCN reaction causing severe rash involving mucus membranes or skin necrosis: No Has patient had a PCN reaction that required hospitalization No Has patient had a PCN reaction occurring within the last 10 years: No If all of the above answers are "NO", then may proceed with Cephalosporin use.   . Ibuprofen Nausea And Vomiting    GI upset, Shakes  . Aspirin Nausea And Vomiting    Gi upset   Patient Measurements: Height: 5\' 2"  (157.5 cm) Weight: 179 lb 14.3 oz (81.6 kg) IBW/kg (Calculated) : 50.1  Vital Signs: Temp: 98.3 F (36.8 C) (11/24 0733) Temp Source: Oral (11/24 0733) BP: 139/79 (11/24 0733) Pulse Rate: 72 (11/24 0733)  Labs:  Recent Labs  12/05/15 2121 12/05/15 2148 12/05/15 2152 12/06/15 0954  HGB  --  18.0* 19.6*  --   HCT  --  53.0* 50.5*  --   PLT  --   --  314  --   HEPARINUNFRC  --   --   --  0.22*  CREATININE 1.38* 1.30*  --   --   TROPONINI <0.03  --   --   --     Estimated Creatinine Clearance: 54.7 mL/min (by C-G formula based on SCr of 1.3 mg/dL (H)).   Medical History: Past Medical History:  Diagnosis Date  . Anxiety   . Back pain   . Chronic back pain 02/04/13   By patient report  . Depression   . Hypertension   . Laryngitis, chronic   . Panic attack   . Scoliosis   . Ulcer [facility])     Assessment: 45 y/o F started on heparin for acute ischemia of left arm most likely emoblic in nature. Polycythemia may also be contributing. Now s/p brachial artery emoblectomy. Heparin level 0.22 (subtherapeutic). Hgb elevated (19.6), pltc wnl. Will bolus heparin and increase rate. Check  confirmatory level in 6 hours. No s/sx of bleeding noted  Goal of Therapy:  Heparin level 0.3-0.7 units/ml Monitor platelets by anticoagulation protocol: Yes   Plan:  Heparin 1000 unit bolus Increase heparin to 1150 units/hr 6 hour HL -Daily CBC/HL -Monitor for bleeding  Alfredo Bach, Cleotis Nipper, PharmD Clinical Pharmacy Resident [phone] (Pager) 12/06/2015 11:34 AM

## 2015-12-06 NOTE — Progress Notes (Signed)
Pt reports pain is 6/10 but easily falls asleep respirations 10-12.  Will be taking pt to unit.

## 2015-12-06 NOTE — Addendum Note (Signed)
Addendum  created 12/06/15 6237 by Arta Bruce, MD   Anesthesia Review and Sign - Ready for Procedure, Anesthesia Review and Sign - Signed, Anesthesia Review and Sign - Undo, Sign clinical note

## 2015-12-06 NOTE — Anesthesia Procedure Notes (Signed)
Procedure Name: Intubation Date/Time: 12/06/2015 1:03 AM Performed by: Molli Hazard Pre-anesthesia Checklist: Patient identified, Suction available, Emergency Drugs available and Patient being monitored Patient Re-evaluated:Patient Re-evaluated prior to inductionOxygen Delivery Method: Circle system utilized Preoxygenation: Pre-oxygenation with 100% oxygen Intubation Type: IV induction, Rapid sequence and Cricoid Pressure applied Laryngoscope Size: Miller and 2 Grade View: Grade I Tube type: Oral Tube size: 7.5 mm Number of attempts: 1 Airway Equipment and Method: Stylet Placement Confirmation: ETT inserted through vocal cords under direct vision,  positive ETCO2 and breath sounds checked- equal and bilateral Secured at: 21 cm Tube secured with: Tape Dental Injury: Teeth and Oropharynx as per pre-operative assessment

## 2015-12-06 NOTE — Progress Notes (Signed)
Patient arrived on the unit from 3S, assesment completed see flowsheet, placed on tele ccmd notified, patient oriented to room and staff, bed in lowest position, call bell within reach will continue to [facility]

## 2015-12-06 NOTE — Progress Notes (Signed)
ANTICOAGULATION CONSULT NOTE - Initial Consult  Pharmacy Consult for Heparin/Vancomycin Indication: S/P brachial artery embolectomy/Surgical Prophylaxis    Allergies  Allergen Reactions  . Penicillins Anaphylaxis  . Ibuprofen Nausea And Vomiting    GI upset, Shakes  . Aspirin Nausea And Vomiting    Gi upset   Patient Measurements: Height: 5\' 4"  (162.6 cm) Weight: 180 lb (81.6 kg) IBW/kg (Calculated) : 54.7  Vital Signs: Temp: 101.7 F (38.7 C) (11/24 0300) BP: 116/44 (11/24 0300) Pulse Rate: 73 (11/24 0300)  Labs:  Recent Labs  12/05/15 2121 12/05/15 2148 12/05/15 2152  HGB  --  18.0* 19.6*  HCT  --  53.0* 50.5*  PLT  --   --  314  CREATININE 1.38* 1.30*  --   TROPONINI <0.03  --   --     Estimated Creatinine Clearance: 57.1 mL/min (by C-G formula based on SCr of 1.3 mg/dL (H)).   Medical History: Past Medical History:  Diagnosis Date  . Anxiety   . Back pain   . Chronic back pain 02/04/13   By patient report  . Depression   . Hypertension   . Laryngitis, chronic   . Panic attack   . Scoliosis   . Ulcer [facility])     Assessment: 45 y/o F transfer from [facility] for emergent vascular surgery, pt now s/p brachial artery embolectomy, CBC good, Scr is slightly elevated. Heparin drip was stopped peri-operatively, will re-start.   Goal of Therapy:  Heparin level 0.3-0.7 units/ml Monitor platelets by anticoagulation protocol: Yes   Plan:  -Re-start heparin drip at 1000 units/hr -1100 HL -Daily CBC/HL -Monitor for bleeding -Vancomycin 1000 mg IV x 1 for surgical prophylaxis  Abran Duke 12/06/2015,3:42 AM

## 2015-12-06 NOTE — Op Note (Signed)
Procedure: Left Brachial ulnar and radial artery embolectomy  Preoperative diagnosis: Acute ischemia left hand  Postoperative diagnosis: Same  Anesthesia: Gen.  Assistant: Tilden Fossa, R.N.FA  Operative findings: #1 acute embolus brachial bifurcation and distal radial and ulnar arteries  Specimens: Embolic material  Operative details: After obtaining informed consent, the patient was taken to the operating room. The patient was placed in supine position on the operating room table. After induction of general anesthesia and placement of a laryngeal mask, the patient's entire left upper extremity was prepped and draped in the usual sterile fashion. Next, a longitudinal incision was made on the left arm at the level of the antecubital crease. The incision was carried down through the subcutaneous tissues down to the level of the bicipital aponeurosis. This was taken down with cautery. The brachial artery was dissected free circumferentially. There was visible thrombus within the brachial artery. Dissection was carried down to the bifurcation of the brachial artery. The radial and ulnar arteries were dissected free circumferentially and vessel loops were placed around these. The patient had been placed on a heparin drip preoperatively. She was given an additional bolus of 5000 units of intravenous heparin. After 2 minutes of circulation time, Vesseloops were used to control the proximal brachial artery as well as the radial and ulnar arteries. A transverse arteriotomy was made just above the brachial bifurcation. There was visible embolic material within the artery. This was removed under direct vision. A #3 Fogarty catheter was then passed proximally up the brachial artery with multiple to make sure that all embolic material was removed and there was brisk arterial inflow. 2 clean passes were obtained. This was then reoccluded with a vessel loop. I then proceeded to pass a #3 Fogarty catheters down the  radial and ulnar arteries.  Small amounts of embolic material were removed from both of these. Catheters were passed down until the catheter was at least 40 cm and 2 clean passes were obtained. Each branch was then thoroughly flushed with heparinized saline. All embolic material was sent to pathology as specimen. The arteriotomy was then repaired using a running 6-0 Prolene suture. At completion of the anastomosis everything was forebled backbled and thoroughly flushed. The anastomosis was secured.  Vesseloops were released and there was a palpable radial pulse immediately. There was biphasic radial Doppler flow. There is monophasic ulnar Doppler flow. There was good flow in the brachial artery. There was good Doppler flow in the proximal radial and ulnar arteries. At this point hemostasis was obtained. Subcutaneous tissues were reapproximated using running 3-0 Vicryl suture. The skin was closed with a 4 0 Vicryl subcuticular stitch. Dermabond was applied. The patient tolerated the procedure well and there were no complications. Instrument sponge and needle counts were correct at the end of the case. Patient was taken to recovery in stable condition.  Fabienne Bruns, MD [facility] [place] Office: [phone] Pager: [phone]

## 2015-12-06 NOTE — Transfer of Care (Signed)
Immediate Anesthesia Transfer of Care Note  Patient: [name]  Procedure(s) Performed: Procedure(s): EMBOLECTOMY LEFT ARM (Left)  Patient Location: PACU  Anesthesia Type:General  Level of Consciousness: awake, alert  and oriented  Airway & Oxygen Therapy: Patient connected to nasal cannula oxygen  Post-op Assessment: Report given to RN and Post -op Vital signs reviewed and stable  Post vital signs: Reviewed and stable  Last Vitals:  Vitals:   12/06/15 0000 12/06/15 0231  BP: 130/78 122/67  Pulse: 77   Resp: 14   Temp:  (!) 38.5 C    Last Pain:  Vitals:   12/05/15 2322  PainSc: 10-Worst pain ever         Complications: No apparent anesthesia complications

## 2015-12-06 NOTE — Anesthesia Preprocedure Evaluation (Addendum)
Anesthesia Evaluation  Patient identified by MRN, date of birth, ID band Patient awake    Reviewed: Allergy & Precautions, NPO status , Patient's Chart, lab work & pertinent test results  Airway Mallampati: I  TM Distance: >3 FB Neck ROM: Full    Dental  (+) Edentulous Upper, Edentulous Lower   Pulmonary Current Smoker,    Pulmonary exam normal        Cardiovascular hypertension, Pt. on medications Normal cardiovascular exam     Neuro/Psych Anxiety Depression    GI/Hepatic   Endo/Other    Renal/GU      Musculoskeletal   Abdominal   Peds  Hematology   Anesthesia Other Findings   Reproductive/Obstetrics                             Anesthesia Physical Anesthesia Plan  ASA: II and emergent  Anesthesia Plan: General   Post-op Pain Management:    Induction: Intravenous, Rapid sequence and Cricoid pressure planned  Airway Management Planned: Oral ETT  Additional Equipment:   Intra-op Plan: Utilization of Controlled Hypotension per surrgeon request  Post-operative Plan: Extubation in OR  Informed Consent:   Plan Discussed with: CRNA and Surgeon  Anesthesia Plan Comments:        Anesthesia Quick Evaluation

## 2015-12-06 NOTE — ED Notes (Signed)
Report given to OR RN @ bedside.

## 2015-12-06 NOTE — Anesthesia Postprocedure Evaluation (Signed)
Anesthesia Post Note  Patient: [name]  Procedure(s) Performed: Procedure(s) (LRB): EMBOLECTOMY LEFT ARM (Left)  Patient location during evaluation: PACU Anesthesia Type: General Level of consciousness: awake and alert Pain management: pain level controlled Vital Signs Assessment: post-procedure vital signs reviewed and stable Respiratory status: spontaneous breathing, nonlabored ventilation, respiratory function stable and patient connected to nasal cannula oxygen Cardiovascular status: blood pressure returned to baseline and stable Postop Assessment: no signs of nausea or vomiting Anesthetic complications: no    Last Vitals:  Vitals:   12/06/15 0300 12/06/15 0321  BP: (!) 116/44 (!) 118/55  Pulse: 73 76  Resp: 11 14  Temp: (!) 38.7 C 36.7 C    Last Pain:  Vitals:   12/06/15 0457  TempSrc:   PainSc: Asleep                 Mickel Schreur DAVID

## 2015-12-07 ENCOUNTER — Encounter (HOSPITAL_COMMUNITY): Payer: Self-pay | Admitting: Vascular Surgery

## 2015-12-07 ENCOUNTER — Inpatient Hospital Stay (HOSPITAL_COMMUNITY): Payer: Medicaid Other

## 2015-12-07 DIAGNOSIS — I10 Essential (primary) hypertension: Secondary | ICD-10-CM

## 2015-12-07 LAB — BASIC METABOLIC PANEL
Anion gap: 10 (ref 5–15)
Anion gap: 8 (ref 5–15)
BUN: 7 mg/dL (ref 6–20)
BUN: 8 mg/dL (ref 6–20)
CALCIUM: 8.1 mg/dL — AB (ref 8.9–10.3)
CHLORIDE: 101 mmol/L (ref 101–111)
CHLORIDE: 102 mmol/L (ref 101–111)
CO2: 28 mmol/L (ref 22–32)
CO2: 29 mmol/L (ref 22–32)
CREATININE: 0.89 mg/dL (ref 0.44–1.00)
Calcium: 8.1 mg/dL — ABNORMAL LOW (ref 8.9–10.3)
Creatinine, Ser: 0.84 mg/dL (ref 0.44–1.00)
GFR calc Af Amer: >60 mL/min (ref >60)
GFR calc Af Amer: >60 mL/min (ref >60)
GFR calc non Af Amer: >60 mL/min (ref >60)
GLUCOSE: 106 mg/dL — AB (ref 65–99)
Glucose, Bld: 115 mg/dL — ABNORMAL HIGH (ref 65–99)
POTASSIUM: 2.9 mmol/L — AB (ref 3.5–5.1)
Potassium: 2.4 mmol/L — CL (ref 3.5–5.1)
SODIUM: 139 mmol/L (ref 135–145)
Sodium: 139 mmol/L (ref 135–145)

## 2015-12-07 LAB — CBC
HCT: 41.4 % (ref 36.0–46.0)
Hemoglobin: 14.7 g/dL (ref 12.0–15.0)
MCH: 32.6 pg (ref 26.0–34.0)
MCHC: 35.5 g/dL (ref 30.0–36.0)
MCV: 91.8 fL (ref 78.0–100.0)
PLATELETS: 169 10*3/uL (ref 150–400)
RBC: 4.51 MIL/uL (ref 3.87–5.11)
RDW: 13.1 % (ref 11.5–15.5)
WBC: 11.9 10*3/uL — ABNORMAL HIGH (ref 4.0–10.5)

## 2015-12-07 LAB — ECHOCARDIOGRAM COMPLETE
HEIGHTINCHES: 62 in
WEIGHTICAEL: 2878.33 [oz_av]

## 2015-12-07 LAB — HEPARIN LEVEL (UNFRACTIONATED)
HEPARIN UNFRACTIONATED: 0.36 [IU]/mL (ref 0.30–0.70)
Heparin Unfractionated: 0.26 IU/mL — ABNORMAL LOW (ref 0.30–0.70)

## 2015-12-07 MED ORDER — ALPRAZOLAM 1 MG PO TABS: 1.0000 mg | ORAL_TABLET | Freq: Four times a day (QID) | ORAL | 0 refills | Status: DC

## 2015-12-07 MED ORDER — ASPIRIN 81 MG PO TBEC: 81.0000 mg | DELAYED_RELEASE_TABLET | Freq: Every day | ORAL | Status: DC

## 2015-12-07 MED ORDER — POTASSIUM CHLORIDE CRYS ER 20 MEQ PO TBCR
60.0000 meq | EXTENDED_RELEASE_TABLET | Freq: Once | ORAL | Status: AC
  Administered 2015-12-07: 60 meq via ORAL
  Filled 2015-12-07: qty 3

## 2015-12-07 MED ORDER — ALPRAZOLAM 0.5 MG PO TABS
1.0000 mg | ORAL_TABLET | Freq: Four times a day (QID) | ORAL | Status: DC
Start: 2015-12-07 — End: 2015-12-07
  Administered 2015-12-07 (×2): 1 mg via ORAL
  Filled 2015-12-07 (×2): qty 2

## 2015-12-07 MED ORDER — ASPIRIN EC 81 MG PO TBEC
81.0000 mg | DELAYED_RELEASE_TABLET | Freq: Every day | ORAL | Status: DC
  Administered 2015-12-07: 81 mg via ORAL
  Filled 2015-12-07: qty 1

## 2015-12-07 MED ORDER — SUCRALFATE 1 G PO TABS: 1.0000 g | ORAL_TABLET | Freq: Two times a day (BID) | ORAL | Status: DC

## 2015-12-07 MED ORDER — POTASSIUM CHLORIDE CRYS ER 20 MEQ PO TBCR
80.0000 meq | EXTENDED_RELEASE_TABLET | Freq: Once | ORAL | Status: AC
  Administered 2015-12-07: 80 meq via ORAL
  Filled 2015-12-07: qty 4

## 2015-12-07 NOTE — Progress Notes (Signed)
ANTICOAGULATION CONSULT NOTE  Pharmacy Consult for Heparin Indication: s/p embolectomy  Allergies  Allergen Reactions  . Penicillins Anaphylaxis    Has patient had a PCN reaction causing immediate rash, facial/tongue/throat swelling, SOB or lightheadedness with hypotension: No Has patient had a PCN reaction causing severe rash involving mucus membranes or skin necrosis: No Has patient had a PCN reaction that required hospitalization No Has patient had a PCN reaction occurring within the last 10 years: No If all of the above answers are "NO", then may proceed with Cephalosporin use.   . Ibuprofen Nausea And Vomiting    GI upset, Shakes  . Aspirin Nausea And Vomiting    Gi upset    Patient Measurements: Height: 5\' 2"  (157.5 cm) Weight: 179 lb 14.3 oz (81.6 kg) IBW/kg (Calculated) : 50.1 Heparin Dosing Weight: 62kg  Vital Signs: Temp: 98.7 F (37.1 C) (11/25 0706) Temp Source: Oral (11/25 0706) BP: 120/64 (11/25 0706) Pulse Rate: 65 (11/25 0706)  Labs:  Recent Labs  12/05/15 2121  12/05/15 2148 12/05/15 2152  12/06/15 1826 12/07/15 0158 12/07/15 0550 12/07/15 0938  HGB  --   < > 18.0* 19.6*  --   --  14.7  --   --   HCT  --   --  53.0* 50.5*  --   --  41.4  --   --   PLT  --   --   --  314  --   --  169  --   --   HEPARINUNFRC  --   --   --   --   < > 0.20* 0.26*  --  0.36  CREATININE 1.38*  --  1.30*  --   --   --  0.89 0.84  --   TROPONINI <0.03  --   --   --   --   --   --   --   --   < > = values in this interval not displayed.  Estimated Creatinine Clearance: 84.6 mL/min (by C-G formula based on SCr of 0.84 mg/dL).  Assessment: 45 yo female s/p brachial artery embolectomy for heparin. Heparin is at goal on 1500 units/hr. Noted plans for home today if Echo is normal.   Goal of Therapy:  Heparin level 0.3-0.7 units/ml Monitor platelets by anticoagulation protocol: Yes   Plan:  -No heparin changes needed -Will follow plans post echo -daily heparin level  and CBC   Harland German, Pharm D 12/07/2015 10:38 AM

## 2015-12-07 NOTE — Progress Notes (Addendum)
  [facility] Progress Note  Subjective  - POD #1  Soreness at left arm incision. No pain left hand.   Objective Vitals:   12/06/15 2005 12/07/15 0706  BP: (!) 146/49 120/64  Pulse: 69 65  Resp: 20 18  Temp: 98 F (36.7 C) 98.7 F (37.1 C)    Intake/Output Summary (Last 24 hours) at 12/07/15 0911 Last data filed at 12/07/15 0330  Gross per 24 hour  Intake          3344.58 ml  Output             1400 ml  Net          1944.58 ml   Left brachial incision intact. No hematoma. 2+ left radial pulse.   Assessment/Planning: 45 y.o. female is s/p: Left Brachial ulnar and radial artery embolectomy  1 Day Post-Op   Left hand well perfused. Had ECHO this morning. Read pending.  If ECHO is negative for embolic source, will not start anticoagulation and d/c today.   Raymond Gurney 12/07/2015 9:11 AM -- Agree with above.  2+ radial pulse no numbness back to baseline. History of substance abuse so I do not believe anticoagulation worth risk unless there are findings on ECHO. She has not had any arrhythmia on continuous telemetry for 48 hrs D/c home today if ECHO normal otherwise cardiology consult and further consideration for anticoagulation Hypokalemia continue to replete  Fabienne Bruns, MD [facility] Office: [phone] Pager: [phone]  Laboratory CBC    Component Value Date/Time   WBC 11.9 (H) 12/07/2015 0158   HGB 14.7 12/07/2015 0158   HCT 41.4 12/07/2015 0158   PLT 169 12/07/2015 0158    BMET    Component Value Date/Time   NA 139 12/07/2015 0550   K 2.9 (L) 12/07/2015 0550   CL 102 12/07/2015 0550   CO2 29 12/07/2015 0550   GLUCOSE 106 (H) 12/07/2015 0550   BUN 8 12/07/2015 0550   CREATININE 0.84 12/07/2015 0550   CALCIUM 8.1 (L) 12/07/2015 0550   GFRNONAA >60 12/07/2015 0550   GFRAA >60 12/07/2015 0550    COAG No results found for: INR, PROTIME No results found for:  PTT  Antibiotics Anti-infectives    Start     Dose/Rate Route Frequency Ordered Stop   12/06/15 1000  vancomycin (VANCOCIN) IVPB 1000 mg/200 mL premix     1,000 mg 200 mL/hr over 60 Minutes Intravenous  Once 12/06/15 0342 12/06/15 1308   12/06/15 0015  vancomycin (VANCOCIN) powder 1,000 mg  Status:  Discontinued     1,000 mg Other To Surgery 12/06/15 0012 12/06/15 0249       Maris Berger, PA-C [facility] Office: [phone] Pager: [phone] 12/07/2015 9:11 AM

## 2015-12-07 NOTE — Progress Notes (Signed)
ANTICOAGULATION CONSULT NOTE  Pharmacy Consult for Heparin Indication: s/p embolectomy  Allergies  Allergen Reactions  . Penicillins Anaphylaxis    Has patient had a PCN reaction causing immediate rash, facial/tongue/throat swelling, SOB or lightheadedness with hypotension: No Has patient had a PCN reaction causing severe rash involving mucus membranes or skin necrosis: No Has patient had a PCN reaction that required hospitalization No Has patient had a PCN reaction occurring within the last 10 years: No If all of the above answers are "NO", then may proceed with Cephalosporin use.   . Ibuprofen Nausea And Vomiting    GI upset, Shakes  . Aspirin Nausea And Vomiting    Gi upset    Patient Measurements: Height: 5\' 2"  (157.5 cm) Weight: 179 lb 14.3 oz (81.6 kg) IBW/kg (Calculated) : 50.1 Heparin Dosing Weight: 62kg  Vital Signs: Temp: 98 F (36.7 C) (11/24 2005) Temp Source: Oral (11/24 2005) BP: 146/49 (11/24 2005) Pulse Rate: 69 (11/24 2005)  Labs:  Recent Labs  12/05/15 2121  12/05/15 2148 12/05/15 2152 12/06/15 0954 12/06/15 1826 12/07/15 0158  HGB  --   < > 18.0* 19.6*  --   --  14.7  HCT  --   --  53.0* 50.5*  --   --  41.4  PLT  --   --   --  314  --   --  169  HEPARINUNFRC  --   --   --   --  0.22* 0.20* 0.26*  CREATININE 1.38*  --  1.30*  --   --   --  0.89  TROPONINI <0.03  --   --   --   --   --   --   < > = values in this interval not displayed.  Estimated Creatinine Clearance: 79.8 mL/min (by C-G formula based on SCr of 0.89 mg/dL).  Assessment: 45 yo female s/p brachial artery embolectomy for heparin   Goal of Therapy:  Heparin level 0.3-0.7 units/ml Monitor platelets by anticoagulation protocol: Yes   Plan:  Increase heparin to 1550 units/hr Check heparin level in 6 hours.   Eddie Candle 12/07/2015,3:07 AM

## 2015-12-07 NOTE — Progress Notes (Signed)
CRITICAL VALUE ALERT  Critical value received: Potassium 2.4  Date of notification:  11/25  Time of notification:  0246  Critical value read back: Yes  Nurse who received alert:  Jodene Nam, RN  MD notified (1st page):  Dr. Darrick Penna  Time of first page:  0248  MD notified (2nd page):  Time of second page:  Responding MD:  Dr. Darrick Penna  Time MD responded:  [phone]

## 2015-12-11 ENCOUNTER — Inpatient Hospital Stay (HOSPITAL_COMMUNITY)
Admission: EM | Admit: 2015-12-11 | Discharge: 2015-12-13 | DRG: 641 | Disposition: A | Discharge status: Home / Self Care | Payer: Medicaid Other | Attending: Family Medicine | Admitting: Family Medicine

## 2015-12-11 ENCOUNTER — Encounter (HOSPITAL_COMMUNITY): Payer: Self-pay | Admitting: *Deleted

## 2015-12-11 DIAGNOSIS — E871 Hypo-osmolality and hyponatremia: Secondary | ICD-10-CM | POA: Diagnosis present

## 2015-12-11 DIAGNOSIS — Z7951 Long term (current) use of inhaled steroids: Secondary | ICD-10-CM

## 2015-12-11 DIAGNOSIS — Z825 Family history of asthma and other chronic lower respiratory diseases: Secondary | ICD-10-CM

## 2015-12-11 DIAGNOSIS — E876 Hypokalemia: Principal | ICD-10-CM | POA: Diagnosis present

## 2015-12-11 DIAGNOSIS — T502X5A Adverse effect of carbonic-anhydrase inhibitors, benzothiadiazides and other diuretics, initial encounter: Secondary | ICD-10-CM | POA: Diagnosis present

## 2015-12-11 DIAGNOSIS — F17210 Nicotine dependence, cigarettes, uncomplicated: Secondary | ICD-10-CM | POA: Diagnosis present

## 2015-12-11 DIAGNOSIS — M419 Scoliosis, unspecified: Secondary | ICD-10-CM | POA: Diagnosis present

## 2015-12-11 DIAGNOSIS — Z9889 Other specified postprocedural states: Secondary | ICD-10-CM

## 2015-12-11 DIAGNOSIS — G8929 Other chronic pain: Secondary | ICD-10-CM | POA: Diagnosis present

## 2015-12-11 DIAGNOSIS — D6851 Activated protein C resistance: Secondary | ICD-10-CM | POA: Diagnosis present

## 2015-12-11 DIAGNOSIS — F418 Other specified anxiety disorders: Secondary | ICD-10-CM | POA: Diagnosis present

## 2015-12-11 DIAGNOSIS — I10 Essential (primary) hypertension: Secondary | ICD-10-CM | POA: Diagnosis present

## 2015-12-11 DIAGNOSIS — F988 Other specified behavioral and emotional disorders with onset usually occurring in childhood and adolescence: Secondary | ICD-10-CM | POA: Diagnosis present

## 2015-12-11 DIAGNOSIS — M549 Dorsalgia, unspecified: Secondary | ICD-10-CM | POA: Diagnosis present

## 2015-12-11 DIAGNOSIS — Z79899 Other long term (current) drug therapy: Secondary | ICD-10-CM

## 2015-12-11 DIAGNOSIS — Z833 Family history of diabetes mellitus: Secondary | ICD-10-CM

## 2015-12-11 DIAGNOSIS — Z7982 Long term (current) use of aspirin: Secondary | ICD-10-CM

## 2015-12-11 DIAGNOSIS — F410 Panic disorder [episodic paroxysmal anxiety] without agoraphobia: Secondary | ICD-10-CM | POA: Diagnosis present

## 2015-12-11 DIAGNOSIS — Z79891 Long term (current) use of opiate analgesic: Secondary | ICD-10-CM

## 2015-12-11 HISTORY — DX: Activated protein C resistance: D68.51

## 2015-12-11 LAB — MAGNESIUM: Magnesium: 2 mg/dL (ref 1.7–2.4)

## 2015-12-11 LAB — PHOSPHORUS: Phosphorus: 3.6 mg/dL (ref 2.5–4.6)

## 2015-12-11 LAB — URINALYSIS, ROUTINE W REFLEX MICROSCOPIC
Bilirubin Urine: NEGATIVE
Glucose, UA: NEGATIVE mg/dL
Ketones, ur: NEGATIVE mg/dL
Leukocytes, UA: NEGATIVE
Nitrite: NEGATIVE
Protein, ur: NEGATIVE mg/dL
Specific Gravity, Urine: 1.01 (ref 1.005–1.030)
pH: 6 (ref 5.0–8.0)

## 2015-12-11 LAB — BASIC METABOLIC PANEL
Anion gap: 11 (ref 5–15)
BUN: 15 mg/dL (ref 6–20)
CO2: 28 mmol/L (ref 22–32)
Calcium: 9 mg/dL (ref 8.9–10.3)
Chloride: 95 mmol/L — ABNORMAL LOW (ref 101–111)
Creatinine, Ser: 0.96 mg/dL (ref 0.44–1.00)
GFR calc Af Amer: >60 mL/min (ref >60)
GFR calc non Af Amer: >60 mL/min (ref >60)
Glucose, Bld: 124 mg/dL — ABNORMAL HIGH (ref 65–99)
Potassium: 2.3 mmol/L — CL (ref 3.5–5.1)
Sodium: 134 mmol/L — ABNORMAL LOW (ref 135–145)

## 2015-12-11 LAB — URINE MICROSCOPIC-ADD ON

## 2015-12-11 LAB — CBC WITH DIFFERENTIAL/PLATELET
Basophils Absolute: 0 10*3/uL (ref 0.0–0.1)
Basophils Relative: 0 %
Eosinophils Absolute: 0.1 10*3/uL (ref 0.0–0.7)
Eosinophils Relative: 1 %
HCT: 50.9 % — ABNORMAL HIGH (ref 36.0–46.0)
Hemoglobin: 18.6 g/dL — ABNORMAL HIGH (ref 12.0–15.0)
Lymphocytes Relative: 25 %
Lymphs Abs: 2.9 10*3/uL (ref 0.7–4.0)
MCH: 33.9 pg (ref 26.0–34.0)
MCHC: 36.5 g/dL — ABNORMAL HIGH (ref 30.0–36.0)
MCV: 92.7 fL (ref 78.0–100.0)
Monocytes Absolute: 1.1 10*3/uL — ABNORMAL HIGH (ref 0.1–1.0)
Monocytes Relative: 10 %
Neutro Abs: 7.4 10*3/uL (ref 1.7–7.7)
Neutrophils Relative %: 64 %
Platelets: 293 10*3/uL (ref 150–400)
RBC: 5.49 MIL/uL — ABNORMAL HIGH (ref 3.87–5.11)
RDW: 12.9 % (ref 11.5–15.5)
WBC: 11.6 10*3/uL — ABNORMAL HIGH (ref 4.0–10.5)

## 2015-12-11 LAB — TROPONIN I: Troponin I: <0.03 ng/mL (ref <0.03)

## 2015-12-11 LAB — CBG MONITORING, ED: GLUCOSE-CAPILLARY: 178 mg/dL — AB (ref 65–99)

## 2015-12-11 MED ORDER — ONDANSETRON HCL 4 MG/2ML IJ SOLN: 4.0000 mg | Freq: Four times a day (QID) | INTRAMUSCULAR | Status: DC | PRN

## 2015-12-11 MED ORDER — ALPRAZOLAM 1 MG PO TABS
1.0000 mg | ORAL_TABLET | Freq: Four times a day (QID) | ORAL | Status: DC
  Administered 2015-12-11 – 2015-12-13 (×7): 1 mg via ORAL
  Filled 2015-12-11 (×7): qty 1

## 2015-12-11 MED ORDER — MAGNESIUM SULFATE 2 GM/50ML IV SOLN
2.0000 g | Freq: Once | INTRAVENOUS | Status: AC
  Administered 2015-12-11: 2 g via INTRAVENOUS
  Filled 2015-12-11: qty 50

## 2015-12-11 MED ORDER — FLUTICASONE PROPIONATE 50 MCG/ACT NA SUSP: 1.0000 | Freq: Every day | NASAL | Status: DC

## 2015-12-11 MED ORDER — AMPHETAMINE-DEXTROAMPHETAMINE 10 MG PO TABS
10.0000 mg | ORAL_TABLET | ORAL | Status: DC
  Administered 2015-12-12 – 2015-12-13 (×5): 10 mg via ORAL
  Filled 2015-12-11 (×5): qty 1

## 2015-12-11 MED ORDER — LISINOPRIL 10 MG PO TABS
20.0000 mg | ORAL_TABLET | Freq: Every day | ORAL | Status: DC
  Administered 2015-12-12 – 2015-12-13 (×2): 20 mg via ORAL
  Filled 2015-12-11 (×2): qty 2

## 2015-12-11 MED ORDER — PREGABALIN 75 MG PO CAPS
150.0000 mg | ORAL_CAPSULE | Freq: Three times a day (TID) | ORAL | Status: DC
  Administered 2015-12-11 – 2015-12-13 (×5): 150 mg via ORAL
  Filled 2015-12-11 (×5): qty 2

## 2015-12-11 MED ORDER — OXYCODONE-ACETAMINOPHEN 7.5-325 MG PO TABS
1.0000 | ORAL_TABLET | Freq: Three times a day (TID) | ORAL | Status: DC | PRN
  Administered 2015-12-11 – 2015-12-13 (×4): 1 via ORAL
  Filled 2015-12-11 (×5): qty 1

## 2015-12-11 MED ORDER — HYDROCHLOROTHIAZIDE 25 MG PO TABS: 25.0000 mg | ORAL_TABLET | Freq: Every day | ORAL | Status: DC

## 2015-12-11 MED ORDER — PANTOPRAZOLE SODIUM 40 MG PO TBEC
40.0000 mg | DELAYED_RELEASE_TABLET | Freq: Two times a day (BID) | ORAL | Status: DC
  Administered 2015-12-11 – 2015-12-13 (×4): 40 mg via ORAL
  Filled 2015-12-11 (×4): qty 1

## 2015-12-11 MED ORDER — ASPIRIN EC 81 MG PO TBEC
81.0000 mg | DELAYED_RELEASE_TABLET | Freq: Every day | ORAL | Status: DC
  Administered 2015-12-12 – 2015-12-13 (×2): 81 mg via ORAL
  Filled 2015-12-11 (×2): qty 1

## 2015-12-11 MED ORDER — POTASSIUM CHLORIDE CRYS ER 20 MEQ PO TBCR
60.0000 meq | EXTENDED_RELEASE_TABLET | Freq: Once | ORAL | Status: AC
  Administered 2015-12-11: 60 meq via ORAL
  Filled 2015-12-11: qty 3

## 2015-12-11 MED ORDER — METHOCARBAMOL 500 MG PO TABS
750.0000 mg | ORAL_TABLET | Freq: Four times a day (QID) | ORAL | Status: DC
  Administered 2015-12-11 – 2015-12-13 (×7): 750 mg via ORAL
  Filled 2015-12-11 (×7): qty 2

## 2015-12-11 MED ORDER — SODIUM CHLORIDE 0.9 % IV BOLUS (SEPSIS)
1000.0000 mL | Freq: Once | INTRAVENOUS | Status: AC
  Administered 2015-12-11: 1000 mL via INTRAVENOUS

## 2015-12-11 MED ORDER — CELECOXIB 100 MG PO CAPS
100.0000 mg | ORAL_CAPSULE | Freq: Every day | ORAL | Status: DC
  Administered 2015-12-11 – 2015-12-13 (×3): 100 mg via ORAL
  Filled 2015-12-11 (×3): qty 1

## 2015-12-11 MED ORDER — ZOLPIDEM TARTRATE 5 MG PO TABS
5.0000 mg | ORAL_TABLET | Freq: Every evening | ORAL | Status: DC | PRN
  Administered 2015-12-11 – 2015-12-12 (×2): 5 mg via ORAL
  Filled 2015-12-11 (×2): qty 1

## 2015-12-11 MED ORDER — CITALOPRAM HYDROBROMIDE 20 MG PO TABS
20.0000 mg | ORAL_TABLET | Freq: Every day | ORAL | Status: DC
  Administered 2015-12-12 – 2015-12-13 (×2): 20 mg via ORAL
  Filled 2015-12-11 (×2): qty 1

## 2015-12-11 MED ORDER — ONDANSETRON HCL 4 MG PO TABS: 4.0000 mg | ORAL_TABLET | Freq: Four times a day (QID) | ORAL | Status: DC | PRN

## 2015-12-11 MED ORDER — POTASSIUM CHLORIDE ER 10 MEQ PO TBCR
20.0000 meq | EXTENDED_RELEASE_TABLET | Freq: Two times a day (BID) | ORAL | Status: DC
  Administered 2015-12-11: 20 meq via ORAL
  Filled 2015-12-11 (×5): qty 2

## 2015-12-11 MED ORDER — POTASSIUM CHLORIDE IN NACL 20-0.9 MEQ/L-% IV SOLN
INTRAVENOUS | Status: DC
  Administered 2015-12-11 – 2015-12-12 (×2): via INTRAVENOUS

## 2015-12-11 MED ORDER — ALBUTEROL SULFATE (2.5 MG/3ML) 0.083% IN NEBU: 2.5000 mg | INHALATION_SOLUTION | RESPIRATORY_TRACT | Status: DC | PRN

## 2015-12-11 MED ORDER — ENOXAPARIN SODIUM 40 MG/0.4ML ~~LOC~~ SOLN
40.0000 mg | SUBCUTANEOUS | Status: DC
  Administered 2015-12-11 – 2015-12-12 (×2): 40 mg via SUBCUTANEOUS
  Filled 2015-12-11 (×2): qty 0.4

## 2015-12-11 MED ORDER — SUCRALFATE 1 G PO TABS
1.0000 g | ORAL_TABLET | Freq: Two times a day (BID) | ORAL | Status: DC
  Administered 2015-12-11 – 2015-12-13 (×4): 1 g via ORAL
  Filled 2015-12-11 (×4): qty 1

## 2015-12-11 MED ORDER — ASPIRIN 81 MG PO TBEC: 81.0000 mg | DELAYED_RELEASE_TABLET | Freq: Every day | ORAL | Status: DC

## 2015-12-11 MED ORDER — POTASSIUM CHLORIDE IN NACL 40-0.9 MEQ/L-% IV SOLN
INTRAVENOUS | Status: AC
  Filled 2015-12-11: qty 1000

## 2015-12-11 MED ORDER — LISINOPRIL-HYDROCHLOROTHIAZIDE 20-25 MG PO TABS: 1.0000 | ORAL_TABLET | Freq: Every day | ORAL | Status: DC

## 2015-12-11 MED ORDER — NICOTINE 21 MG/24HR TD PT24
21.0000 mg | MEDICATED_PATCH | Freq: Every day | TRANSDERMAL | Status: DC
  Administered 2015-12-11 – 2015-12-13 (×3): 21 mg via TRANSDERMAL
  Filled 2015-12-11 (×3): qty 1

## 2015-12-11 MED ORDER — POTASSIUM CHLORIDE 10 MEQ/100ML IV SOLN
10.0000 meq | INTRAVENOUS | Status: DC
  Filled 2015-12-11: qty 100

## 2015-12-11 NOTE — H&P (Signed)
History and Physical    [name] ZOX:[id number] DOB: 12/08/1970 DOA: 12/11/2015  PCP: Milana ObeyStephen D Knowlton, MD   Patient coming from: Home.  Chief Complaint: Body aches.   HPI: [name] is a 45 y.o. female with medical history significant of anxiety, chronic back pain, depression, anxiety, panic attacks, heterozygous factor V Leyden mutation, hypertension, chronic laryngitis who just underwent a left arm embolectomy after being admitted for acute left upper extremity ischemia last week and was recovering well until today when she started developing generalized weakness, body aches and muscle cramps. They are worse in the back and hips. She denies fever, chills, abdominal pain, nausea, emesis, diarrhea, dysuria or hematuria.  ED Course: Workup shows a potassium level of 2.3 and sodium level of 130 mmol/L. Her hemoglobin level has risen from 14.7 to 18.6 grams per deciliter. She has received oral and IV electrolyte replacement.  Review of Systems: As per HPI otherwise 10 point review of systems negative.    Past Medical History:  Diagnosis Date  . Anxiety   . Back pain   . Chronic back pain 02/04/13   By patient report  . Depression   . Factor 5 Leiden mutation, heterozygous (HCC)   . Hypertension   . Laryngitis, chronic   . Panic attack   . Scoliosis   . Ulcer [facility](HCC)     Past Surgical History:  Procedure Laterality Date  . BACK SURGERY    . CESAREAN SECTION    . CHOLECYSTECTOMY  11/27/2011   Procedure: LAPAROSCOPIC CHOLECYSTECTOMY;  Surgeon: Fabio BeringBrent C Ziegler, MD;  Location: [facility];  Service: General;  Laterality: N/A;  . EMBOLECTOMY Left 12/06/2015   Procedure: EMBOLECTOMY LEFT ARM;  Surgeon: Sherren Kernsharles E Fields, MD;  [facility];  Service: Vascular;  Laterality: Left;  . NECK SURGERY    . TUBAL LIGATION       reports that she has been smoking Cigarettes.  She has a 37.50 pack-year smoking history. She has never used smokeless tobacco. She reports that she does  not drink alcohol or use drugs.  Allergies  Allergen Reactions  . Penicillins Anaphylaxis    Has patient had a PCN reaction causing immediate rash, facial/tongue/throat swelling, SOB or lightheadedness with hypotension: No Has patient had a PCN reaction causing severe rash involving mucus membranes or skin necrosis: No Has patient had a PCN reaction that required hospitalization No Has patient had a PCN reaction occurring within the last 10 years: No If all of the above answers are "NO", then may proceed with Cephalosporin use.   . Ibuprofen Nausea And Vomiting    GI upset, Shakes  . Aspirin Nausea And Vomiting    Gi upset    Family History  Problem Relation Age of Onset  . Heart disease    . Arthritis    . Lung disease    . Cancer    . Asthma    . Diabetes      Prior to Admission medications   Medication Sig Start Date End Date Taking? Authorizing Provider  albuterol (PROVENTIL HFA;VENTOLIN HFA) 108 (90 BASE) MCG/ACT inhaler Inhale 2 puffs into the lungs every 4 (four) hours as needed for wheezing or shortness of breath. 05/03/14  Yes Kristen N Ward, DO  ALPRAZolam (XANAX) 1 MG tablet Take 1 tablet (1 mg total) by mouth 4 (four) times daily. 12/07/15  Yes Kimberly A Trinh, PA-C  amphetamine-dextroamphetamine (ADDERALL) 10 MG tablet Take 10 mg by mouth 3 (three) times daily. 8a,  12p, 3p   Yes Historical Provider, MD  aspirin EC 81 MG EC tablet Take 1 tablet (81 mg total) by mouth daily. 12/08/15  Yes Raymond Gurney, PA-C  celecoxib (CELEBREX) 100 MG capsule Take 100 mg by mouth daily.   Yes Historical Provider, MD  citalopram (CELEXA) 20 MG tablet Take 20 mg by mouth daily.    Yes Historical Provider, MD  fluticasone (FLONASE) 50 MCG/ACT nasal spray Place 1 spray into both nostrils at bedtime.   Yes Historical Provider, MD  lisinopril-hydrochlorothiazide (PRINZIDE,ZESTORETIC) 20-25 MG per tablet Take 1 tablet by mouth daily.    Yes Historical Provider, MD  methocarbamol  (ROBAXIN) 750 MG tablet Take 750 mg by mouth 4 (four) times daily.     Yes Historical Provider, MD  oxyCODONE-acetaminophen (PERCOCET) 7.5-325 MG tablet Take 1 tablet by mouth 3 (three) times daily as needed for severe pain.   Yes Historical Provider, MD  pantoprazole (PROTONIX) 40 MG tablet Take 40 mg by mouth 2 (two) times daily.    Yes Historical Provider, MD  potassium chloride (K-DUR) 10 MEQ tablet Take 10 mEq by mouth daily.   Yes Historical Provider, MD  pregabalin (LYRICA) 150 MG capsule Take 150 mg by mouth 3 (three) times daily.    Yes Historical Provider, MD  sucralfate (CARAFATE) 1 g tablet Take 1 tablet (1 g total) by mouth 2 (two) times daily. 12/07/15  Yes Raymond Gurney, PA-C    Physical Exam:  Constitutional: NAD, calm, comfortable Vitals:   12/11/15 1800 12/11/15 1900 12/11/15 2000 12/11/15 2048  BP: 117/78 109/71 104/57 116/73  Pulse: 73 74 71 75  Resp: 20 11 15 15   Temp:    97.6 F (36.4 C)  TempSrc:    Oral  SpO2: 94% 99% 100% 99%  Weight:    82.8 kg (182 lb 9.6 oz)  Height:       Eyes: PERRL, lids and conjunctivae normal ENMT: Mucous membranes are dry. Posterior pharynx clear of any exudate or lesions. Neck: normal, supple, no masses, no thyromegaly Respiratory: clear to auscultation bilaterally, no wheezing, no crackles. Normal respiratory effort. No accessory muscle use.  Cardiovascular: Regular rate and rhythm, no murmurs / rubs / gallops. No extremity edema. 2+ radial and pedal pulses with normal capillary refill. No carotid bruits.  Abdomen: no tenderness, no masses palpated. No hepatosplenomegaly. Bowel sounds positive.  Musculoskeletal: no clubbing / cyanosis. No joint deformity upper and lower extremities. Good ROM, no contractures. Normal muscle tone.  Skin: Left antecubital fossa surgical incision healing well. Neurologic: CN 2-12 grossly intact. Sensation intact, DTR normal. Strength 5/5 in all 4.  Psychiatric: Normal judgment and insight. Alert and  oriented x 3. Normal mood.     Labs on Admission: I have personally reviewed following labs and imaging studies  CBC:  Recent Labs Lab 12/05/15 2148 12/05/15 2152 12/07/15 0158 12/11/15 1641  WBC  --  20.8* 11.9* 11.6*  NEUTROABS  --  17.5*  --  7.4  HGB 18.0* 19.6* 14.7 18.6*  HCT 53.0* 50.5* 41.4 50.9*  MCV  --  89.9 91.8 92.7  PLT  --  314 169 293   Basic Metabolic Panel:  Recent Labs Lab 12/05/15 2121 12/05/15 2148 12/07/15 0158 12/07/15 0550 12/11/15 1641 12/11/15 1900  NA 136 137 139 139 134*  --   K 3.1* 4.0 2.4* 2.9* 2.3*  --   CL 102 101 101 102 95*  --   CO2 20*  --  28 29 28   --  GLUCOSE 184* 174* 115* 106* 124*  --   BUN 13 14 7 8 15   --   CREATININE 1.38* 1.30* 0.89 0.84 0.96  --   CALCIUM 9.7  --  8.1* 8.1* 9.0  --   MG  --   --   --   --   --  2.0  PHOS  --   --   --   --   --  3.6   GFR: Estimated Creatinine Clearance: 74.6 mL/min (by C-G formula based on SCr of 0.96 mg/dL). Liver Function Tests: No results for input(s): AST, ALT, ALKPHOS, BILITOT, PROT, ALBUMIN in the last 168 hours. No results for input(s): LIPASE, AMYLASE in the last 168 hours. No results for input(s): AMMONIA in the last 168 hours. Coagulation Profile: No results for input(s): INR, PROTIME in the last 168 hours. Cardiac Enzymes:  Recent Labs Lab 12/05/15 2121 12/11/15 1641  TROPONINI <0.03 <0.03   BNP (last 3 results) No results for input(s): PROBNP in the last 8760 hours. HbA1C: No results for input(s): HGBA1C in the last 72 hours. CBG:  Recent Labs Lab 12/11/15 1615  GLUCAP 178*   Lipid Profile: No results for input(s): CHOL, HDL, LDLCALC, TRIG, CHOLHDL, LDLDIRECT in the last 72 hours. Thyroid Function Tests: No results for input(s): TSH, T4TOTAL, FREET4, T3FREE, THYROIDAB in the last 72 hours. Anemia Panel: No results for input(s): VITAMINB12, FOLATE, FERRITIN, TIBC, IRON, RETICCTPCT in the last 72 hours. Urine analysis:    Component Value Date/Time     COLORURINE YELLOW 12/11/2015 1930   APPEARANCEUR CLEAR 12/11/2015 1930   LABSPEC 1.010 12/11/2015 1930   PHURINE 6.0 12/11/2015 1930   GLUCOSEU NEGATIVE 12/11/2015 1930   HGBUR LARGE (A) 12/11/2015 1930   BILIRUBINUR NEGATIVE 12/11/2015 1930   KETONESUR NEGATIVE 12/11/2015 1930   PROTEINUR NEGATIVE 12/11/2015 1930   UROBILINOGEN 0.2 01/10/2014 2312   NITRITE NEGATIVE 12/11/2015 1930   LEUKOCYTESUR NEGATIVE 12/11/2015 1930    Recent Results (from the past 240 hour(s))  MRSA PCR Screening     Status: None   Collection Time: 12/06/15  4:02 AM  Result Value Ref Range Status   MRSA by PCR NEGATIVE NEGATIVE Final    Comment:        The GeneXpert MRSA Assay (FDA approved for NASAL specimens only), is one component of a comprehensive MRSA colonization surveillance program. It is not intended to diagnose MRSA infection nor to guide or monitor treatment for MRSA infections.      Radiological Exams on Admission: No results found.  EKG: Independently reviewed. Vent. rate 89 BPM PR interval * ms QRS duration 91 ms QT/QTc 329/401 ms P-R-T axes 80 44 -70 Sinus rhythm Borderline repolarization abnormality  Assessment/Plan Principal Problem:   Hypokalemia Admit to telemetry/observation. Continue potassium replacement. Follow-up potassium level in the morning. Decrease hydrochlorothiazide to 12.5 mg by mouth daily. Patient was told that she will need potassium supplementation on a regular basis.  Active Problems:   Hyponatremia Secondary to decreased oral intake and hydrochlorothiazide. Hold HCTZ then decrease it to 12.5 mg by mouth daily. Continue IV hydration.    History of embolectomy of left arm.  Continue aspirin. Patient to follow-up with vascular surgery as scheduled.    Depression with anxiety Continue alprazolam 1 mg by mouth 4 times a day. Continue Celexa 20 mg by mouth daily.    ADD Continue Adderall.     Essential hypertension Continue lisinopril 20  mg by mouth daily. Hold hydrochlorothiazide, then decrease to 12.5  mg by mouth daily. Monitor blood pressure.    Chronic back pain Continue Percocet 7.5/325 3 times a day as needed Continue Robaxin 750 mg by mouth 4 times a day.       DVT prophylaxis: Lovenox SQ. Code Status: Full code. Family Communication:  Disposition Plan: Admit for potassium replacement and IV hydration. Consults called:  Admission status: Observation/telemetry.   Bobette Mo MD [facility] Pager [phone].  If 7PM-7AM, please contact night-coverage [email or web address] Password [email or web address]  12/11/2015, 11:26 PM

## 2015-12-11 NOTE — ED Provider Notes (Signed)
[facility] DEPT Provider Note   CSN: [id number] Arrival date & time: 12/11/15  1548     History   Chief Complaint No chief complaint on file.   HPI [name] is a 45 y.o. female.  HPI   45 year old female with generalized weakness. She is just discharged from the hospital this past weekend after admission for acute left upper extremity ischemia and she underwent left brachial, ulnar and radial artery embolectomy. She reports that she seemed to be recuperating well up until today. She has been feeling very weak throughout the day. She is having some myalgias and crampy pain in her extremities. They're worse in her bilateral thighs. No fever. No urinary complaints. No changes at surgical site.  Past Medical History:  Diagnosis Date  . Anxiety   . Back pain   . Chronic back pain 02/04/13   By patient report  . Depression   . Factor 5 Leiden mutation, heterozygous (HCC)   . Hypertension   . Laryngitis, chronic   . Panic attack   . Scoliosis   . Ulcer [facility])     Patient Active Problem List   Diagnosis Date Noted  . Ischemia of hand 12/06/2015  . 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 Knee pain 05/31/2012  . Bilateral leg weakness 11/20/2010    Past Surgical History:  Procedure Laterality Date  . BACK SURGERY    . CESAREAN SECTION    . CHOLECYSTECTOMY  11/27/2011   Procedure: LAPAROSCOPIC CHOLECYSTECTOMY;  Surgeon: Fabio Bering, MD;  Location: [facility] ORS;  Service: General;  Laterality: N/A;  . EMBOLECTOMY Left 12/06/2015   Procedure: EMBOLECTOMY LEFT ARM;  Surgeon: Sherren Kerns, MD;  Location: [facility] OR;  Service: Vascular;  Laterality: Left;  . NECK SURGERY    . TUBAL LIGATION      OB History    Gravida Para Term Preterm AB Living   4 3 3   1 3    SAB TAB Ectopic Multiple Live Births                   Home Medications    Prior to Admission medications   Medication Sig Start Date End Date  Taking? Authorizing Provider  albuterol (PROVENTIL HFA;VENTOLIN HFA) 108 (90 BASE) MCG/ACT inhaler Inhale 2 puffs into the lungs every 4 (four) hours as needed for wheezing or shortness of breath. 05/03/14   Kristen N Ward, DO  ALPRAZolam (XANAX) 1 MG tablet Take 1 tablet (1 mg total) by mouth 4 (four) times daily. 12/07/15   Raymond Gurney, PA-C  amphetamine-dextroamphetamine (ADDERALL) 10 MG tablet Take 10 mg by mouth 3 (three) times daily. 8a, 12p, 3p    Historical Provider, MD  aspirin EC 81 MG EC tablet Take 1 tablet (81 mg total) by mouth daily. 12/08/15   Raymond Gurney, PA-C  celecoxib (CELEBREX) 100 MG capsule Take 100 mg by mouth daily.    Historical Provider, MD  citalopram (CELEXA) 20 MG tablet Take 20 mg by mouth daily.     Historical Provider, MD  fluticasone (FLONASE) 50 MCG/ACT nasal spray Place 1 spray into both nostrils at bedtime.    Historical Provider, MD  HYDROcodone-acetaminophen (NORCO/VICODIN) 5-325 MG per tablet Take 1 tablet by mouth every 4 (four) hours as needed. Patient not taking: Reported on 08/15/2014 06/29/14   [name], PA-C  lisinopril-hydrochlorothiazide (PRINZIDE,ZESTORETIC) 20-25 MG per tablet Take 1 tablet by mouth daily.  Historical Provider, MD  methocarbamol (ROBAXIN) 750 MG tablet Take 750 mg by mouth 4 (four) times daily.      Historical Provider, MD  oxyCODONE-acetaminophen (PERCOCET) 7.5-325 MG tablet Take 1 tablet by mouth 3 (three) times daily as needed for severe pain.    Historical Provider, MD  pantoprazole (PROTONIX) 40 MG tablet Take 40 mg by mouth 2 (two) times daily.     Historical Provider, MD  potassium chloride (K-DUR) 10 MEQ tablet Take 10 mEq by mouth daily.    Historical Provider, MD  pregabalin (LYRICA) 150 MG capsule Take 150 mg by mouth 3 (three) times daily.     Historical Provider, MD  sucralfate (CARAFATE) 1 g tablet Take 1 tablet (1 g total) by mouth 2 (two) times daily. 12/07/15   Raymond Gurney, PA-C    Family  History Family History  Problem Relation Age of Onset  . Heart disease    . Arthritis    . Lung disease    . Cancer    . Asthma    . Diabetes      Social History Social History  Substance Use Topics  . Smoking status: Current Every Day Smoker    Packs/day: 1.00    Years: 25.00    Types: Cigarettes  . Smokeless tobacco: Never Used  . Alcohol use No     Allergies   Penicillins; Ibuprofen; and Aspirin   Review of Systems Review of Systems   All systems reviewed and negative, other than as noted in HPI.    Physical Exam Updated Vital Signs BP 117/73   Pulse 77   Temp 97.8 F (36.6 C) (Oral)   Resp 20   Ht 5\' 2"  (1.575 m)   Wt 179 lb (81.2 kg)   LMP 12/11/2015   SpO2 95%   BMI 32.74 kg/m   Physical Exam  Constitutional: She appears well-developed and well-nourished. No distress.  Drowsy, but opens eyes to voice and answers questions appropriately.  HENT:  Head: Normocephalic and atraumatic.  Eyes: Conjunctivae are normal. Right eye exhibits no discharge. Left eye exhibits no discharge.  Neck: Neck supple.  Cardiovascular: Normal rate, regular rhythm and normal heart sounds.  Exam reveals no gallop and no friction rub.   No murmur heard. Surgical site near left antecubital fossa appears be healing without complication. She is neurovascularly intact distally. Hand warm. Good cap refill in all fingers. Easily palpable radial pulse.   Pulmonary/Chest: Effort normal and breath sounds normal. No respiratory distress.  Abdominal: Soft. She exhibits no distension. There is no tenderness.  Musculoskeletal: She exhibits no edema or tenderness.  Neurological: She is alert.  Skin: Skin is warm and dry.  Psychiatric:  Drowsy. Follows commands. No focal motor deficit. CN 2-12 intact.  Nursing note and vitals reviewed.    ED Treatments / Results  Labs (all labs ordered are listed, but only abnormal results are displayed) Labs Reviewed  CBC WITH  DIFFERENTIAL/PLATELET - Abnormal; Notable for the following:       Result Value   WBC 11.6 (*)    RBC 5.49 (*)    Hemoglobin 18.6 (*)    HCT 50.9 (*)    MCHC 36.5 (*)    Monocytes Absolute 1.1 (*)    All other components within normal limits  BASIC METABOLIC PANEL - Abnormal; Notable for the following:    Sodium 134 (*)    Potassium 2.3 (*)    Chloride 95 (*)    Glucose, Bld 124 (*)  All other components within normal limits  CBG MONITORING, ED - Abnormal; Notable for the following:    Glucose-Capillary 178 (*)    All other components within normal limits  TROPONIN I  URINALYSIS, ROUTINE W REFLEX MICROSCOPIC (NOT AT [facility])    EKG  EKG Interpretation  Date/Time:  Wednesday December 11 2015 16:26:30 EST Ventricular Rate:  89 PR Interval:    QRS Duration: 91 QT Interval:  329 QTC Calculation: 401 R Axis:   44 Text Interpretation:  Sinus rhythm Borderline repolarization abnormality similar to previous from 11/23 Confirmed by Kaladin Noseworthy  MD, Miroslav Gin [phone]([id number]) on 12/11/2015 4:37:17 PM       Radiology No results found.  Procedures Procedures (including critical care time)  Medications Ordered in ED Medications  sodium chloride 0.9 % bolus 1,000 mL (1,000 mLs Intravenous New Bag/Given 12/11/15 1705)     Initial Impression / Assessment and Plan / ED Course  I have reviewed the triage vital signs and the nursing notes.  Pertinent labs & imaging results that were available during my care of the patient were reviewed by me and considered in my medical decision making (see chart for details).  Clinical Course      Final Clinical Impressions(s) / ED Diagnoses   Final diagnoses:  Hypokalemia    New Prescriptions New Prescriptions   No medications on file     Raeford RazorStephen Dylan Monforte, MD 12/19/15 1651

## 2015-12-11 NOTE — ED Triage Notes (Addendum)
Pt's friend reports blood clot in left arm last week and had surgery to remove it and placed on baby ASA. Starting yesterday pt has been "in and out of consciousness", bilateral leg pain, nausea, bilateral arm pain, diaphoresis, dizziness. Pt's friend reports this is exactly how she was acting prior to finding out about the blood clot last week. Friend reports pt takes pain medication since surgery and last taken around 1000 this morning. Pt is responsive to pain, oriented x3, lethargic. Pt is normally alert, oriented x 3, independent, ambulatory per friend. Pt reports feeling weak. Pt has hx of factor 5 deficiency.

## 2015-12-11 NOTE — ED Notes (Signed)
CRITICAL VALUE ALERT  Critical value received:  Potassium - 2.3  Date of notification:  12/11/15  Time of notification:  1750  Critical value read back:Yes.    Nurse who received alert:  Antony Odea  MD notified (1st page):  Juleen China  Time of first page:  1751  MD notified (2nd page):  Time of second page:  Responding MD:  Juleen China  Time MD responded:  [phone]

## 2015-12-12 DIAGNOSIS — E871 Hypo-osmolality and hyponatremia: Secondary | ICD-10-CM | POA: Diagnosis not present

## 2015-12-12 DIAGNOSIS — I10 Essential (primary) hypertension: Secondary | ICD-10-CM

## 2015-12-12 DIAGNOSIS — F17210 Nicotine dependence, cigarettes, uncomplicated: Secondary | ICD-10-CM | POA: Diagnosis present

## 2015-12-12 DIAGNOSIS — Z9889 Other specified postprocedural states: Secondary | ICD-10-CM | POA: Diagnosis not present

## 2015-12-12 DIAGNOSIS — M419 Scoliosis, unspecified: Secondary | ICD-10-CM | POA: Diagnosis present

## 2015-12-12 DIAGNOSIS — T502X5A Adverse effect of carbonic-anhydrase inhibitors, benzothiadiazides and other diuretics, initial encounter: Secondary | ICD-10-CM | POA: Diagnosis present

## 2015-12-12 DIAGNOSIS — Z7982 Long term (current) use of aspirin: Secondary | ICD-10-CM | POA: Diagnosis not present

## 2015-12-12 DIAGNOSIS — E876 Hypokalemia: Secondary | ICD-10-CM | POA: Diagnosis not present

## 2015-12-12 DIAGNOSIS — Z833 Family history of diabetes mellitus: Secondary | ICD-10-CM | POA: Diagnosis not present

## 2015-12-12 DIAGNOSIS — Z79891 Long term (current) use of opiate analgesic: Secondary | ICD-10-CM | POA: Diagnosis not present

## 2015-12-12 DIAGNOSIS — D6851 Activated protein C resistance: Secondary | ICD-10-CM | POA: Diagnosis present

## 2015-12-12 DIAGNOSIS — Z79899 Other long term (current) drug therapy: Secondary | ICD-10-CM | POA: Diagnosis not present

## 2015-12-12 DIAGNOSIS — F418 Other specified anxiety disorders: Secondary | ICD-10-CM | POA: Diagnosis present

## 2015-12-12 DIAGNOSIS — G8929 Other chronic pain: Secondary | ICD-10-CM | POA: Diagnosis present

## 2015-12-12 DIAGNOSIS — F988 Other specified behavioral and emotional disorders with onset usually occurring in childhood and adolescence: Secondary | ICD-10-CM | POA: Diagnosis present

## 2015-12-12 DIAGNOSIS — Z825 Family history of asthma and other chronic lower respiratory diseases: Secondary | ICD-10-CM | POA: Diagnosis not present

## 2015-12-12 DIAGNOSIS — M549 Dorsalgia, unspecified: Secondary | ICD-10-CM | POA: Diagnosis present

## 2015-12-12 DIAGNOSIS — Z7951 Long term (current) use of inhaled steroids: Secondary | ICD-10-CM | POA: Diagnosis not present

## 2015-12-12 DIAGNOSIS — F410 Panic disorder [episodic paroxysmal anxiety] without agoraphobia: Secondary | ICD-10-CM | POA: Diagnosis present

## 2015-12-12 LAB — COMPREHENSIVE METABOLIC PANEL WITH GFR
ALT: 22 U/L (ref 14–54)
AST: 13 U/L — ABNORMAL LOW (ref 15–41)
Albumin: 3.6 g/dL (ref 3.5–5.0)
Alkaline Phosphatase: 87 U/L (ref 38–126)
Anion gap: 9 (ref 5–15)
BUN: 15 mg/dL (ref 6–20)
CO2: 26 mmol/L (ref 22–32)
Calcium: 8.6 mg/dL — ABNORMAL LOW (ref 8.9–10.3)
Chloride: 103 mmol/L (ref 101–111)
Creatinine, Ser: 0.82 mg/dL (ref 0.44–1.00)
GFR calc Af Amer: >60 mL/min (ref >60)
GFR calc non Af Amer: >60 mL/min (ref >60)
Glucose, Bld: 91 mg/dL (ref 65–99)
Potassium: 3.1 mmol/L — ABNORMAL LOW (ref 3.5–5.1)
Sodium: 138 mmol/L (ref 135–145)
Total Bilirubin: 0.5 mg/dL (ref 0.3–1.2)
Total Protein: 6.5 g/dL (ref 6.5–8.1)

## 2015-12-12 LAB — CBC
HEMATOCRIT: 46.5 % — AB (ref 36.0–46.0)
HEMOGLOBIN: 16.4 g/dL — AB (ref 12.0–15.0)
MCH: 33.3 pg (ref 26.0–34.0)
MCHC: 35.3 g/dL (ref 30.0–36.0)
MCV: 94.3 fL (ref 78.0–100.0)
Platelets: 259 10*3/uL (ref 150–400)
RBC: 4.93 MIL/uL (ref 3.87–5.11)
RDW: 13.1 % (ref 11.5–15.5)
WBC: 11.5 10*3/uL — ABNORMAL HIGH (ref 4.0–10.5)

## 2015-12-12 MED ORDER — POTASSIUM CHLORIDE CRYS ER 20 MEQ PO TBCR
40.0000 meq | EXTENDED_RELEASE_TABLET | Freq: Once | ORAL | Status: DC
  Filled 2015-12-12: qty 2

## 2015-12-12 MED ORDER — POTASSIUM CHLORIDE CRYS ER 20 MEQ PO TBCR
40.0000 meq | EXTENDED_RELEASE_TABLET | ORAL | Status: AC
  Administered 2015-12-12 (×2): 40 meq via ORAL
  Filled 2015-12-12 (×2): qty 2

## 2015-12-12 MED ORDER — POTASSIUM CHLORIDE CRYS ER 20 MEQ PO TBCR
40.0000 meq | EXTENDED_RELEASE_TABLET | Freq: Once | ORAL | Status: AC
  Administered 2015-12-12: 40 meq via ORAL
  Filled 2015-12-12: qty 2

## 2015-12-12 MED ORDER — POTASSIUM CHLORIDE CRYS ER 20 MEQ PO TBCR: 20.0000 meq | EXTENDED_RELEASE_TABLET | Freq: Two times a day (BID) | ORAL | Status: DC

## 2015-12-12 MED ORDER — HYDROCHLOROTHIAZIDE 12.5 MG PO CAPS
12.5000 mg | ORAL_CAPSULE | Freq: Every day | ORAL | Status: DC
  Administered 2015-12-12: 12.5 mg via ORAL
  Filled 2015-12-12: qty 1

## 2015-12-12 NOTE — Progress Notes (Signed)
[facility]  PROGRESS NOTE  Marijean HeathMelissa B Game ZOX:[id number] DOB: 10/22/1970 DOA: 12/11/2015 PCP: Milana ObeyStephen D Knowlton, MD   Brief HPI:     45 y.o. female with medical history significant of anxiety, chronic back pain, depression, anxiety, panic attacks, heterozygous factor V Leyden mutation, hypertension, chronic laryngitis who just underwent a left arm embolectomy after being admitted for acute left upper extremity ischemia last week and was recovering well until today when she started developing generalized weakness, body aches and muscle cramps. They are worse in the back and hips. She denies fever, chills, abdominal pain, nausea, emesis, diarrhea, dysuria or hematuria   Subjective   Patient still complains of body aches this morning. Potassium is 3.1 today.   Assessment/Plan:     1. Hypokalemia- potassium is still 3.1, will start K-Dur 40 mg by mouth twice a day. Check BMP in a.m. Will hold HCTZ at this time. 2. Hyponatremia- resolved, today sodium is 138 3. History of embolectomy of left arm-continue aspirin, patient to follow-up vascular surgery as scheduled. 4. Depression with anxiety-continue alprazolam, Celexa 5. Attention deficit disorder- continue Adderall 6. Hypertension-hold HCTZ, continue lisinopril. Blood pressure stable.    DVT prophylaxis: Lovenox  Code Status: Full code  Family Communication: discussed with sons at bedside   Disposition Plan: home when potassium replaced   Consultants:  none  Procedures:  None  Continuous infusions . 0.9 % NaCl with KCl 20 mEq / L 10 mL/hr at 12/12/15 1011      Antibiotics:   Anti-infectives    None       Objective   Vitals:   12/11/15 2000 12/11/15 2048 12/12/15 0530 12/12/15 1458  BP: 104/57 116/73 105/68 113/82  Pulse: 71 75 66 78  Resp: 15 15 15 18   Temp:  97.6 F (36.4 C) 97.8 F (36.6 C) 98.2 F (36.8 C)  TempSrc:  Oral Oral Oral  SpO2: 100% 99% 96% 97%  Weight:  82.8 kg (182 lb 9.6  oz)    Height:        Intake/Output Summary (Last 24 hours) at 12/12/15 1526 Last data filed at 12/12/15 1100  Gross per 24 hour  Intake          2565.42 ml  Output                0 ml  Net          2565.42 ml   Filed Weights   12/11/15 1613 12/11/15 2048  Weight: 81.2 kg (179 lb) 82.8 kg (182 lb 9.6 oz)     Physical Examination:  General exam: Appears calm and comfortable. Respiratory system: Clear to auscultation. Respiratory effort normal. Cardiovascular system:  RRR. No  murmurs, rubs, gallops. No pedal edema. GI system: Abdomen is nondistended, soft and nontender. No organomegaly.  Central nervous system. No focal neurological deficits. 5 x 5 power in all extremities. Skin: No rashes, lesions or ulcers. Psychiatry: Alert, oriented x 3.Judgement and insight appear normal. Affect normal.    Data Reviewed: I have personally reviewed following labs and imaging studies  CBG:  Recent Labs Lab 12/11/15 1615  GLUCAP 178*    CBC:  Recent Labs Lab 12/05/15 2148 12/05/15 2152 12/07/15 0158 12/11/15 1641 12/12/15 0513  WBC  --  20.8* 11.9* 11.6* 11.5*  NEUTROABS  --  17.5*  --  7.4  --   HGB 18.0* 19.6* 14.7 18.6* 16.4*  HCT 53.0* 50.5* 41.4 50.9* 46.5*  MCV  --  89.9 91.8 92.7 94.3  PLT  --  314 169 293 259    Basic Metabolic Panel:  Recent Labs Lab 12/05/15 2121 12/05/15 2148 12/07/15 0158 12/07/15 0550 12/11/15 1641 12/11/15 1900 12/12/15 0513  NA 136 137 139 139 134*  --  138  K 3.1* 4.0 2.4* 2.9* 2.3*  --  3.1*  CL 102 101 101 102 95*  --  103  CO2 20*  --  28 29 28   --  26  GLUCOSE 184* 174* 115* 106* 124*  --  91  BUN 13 14 7 8 15   --  15  CREATININE 1.38* 1.30* 0.89 0.84 0.96  --  0.82  CALCIUM 9.7  --  8.1* 8.1* 9.0  --  8.6*  MG  --   --   --   --   --  2.0  --   PHOS  --   --   --   --   --  3.6  --     Recent Results (from the past 240 hour(s))  MRSA PCR Screening     Status: None   Collection Time: 12/06/15  4:02 AM  Result Value  Ref Range Status   MRSA by PCR NEGATIVE NEGATIVE Final    Comment:        The GeneXpert MRSA Assay (FDA approved for NASAL specimens only), is one component of a comprehensive MRSA colonization surveillance program. It is not intended to diagnose MRSA infection nor to guide or monitor treatment for MRSA infections.      Liver Function Tests:  Recent Labs Lab 12/12/15 0513  AST 13*  ALT 22  ALKPHOS 87  BILITOT 0.5  PROT 6.5  ALBUMIN 3.6   No results for input(s): LIPASE, AMYLASE in the last 168 hours. No results for input(s): AMMONIA in the last 168 hours.  Cardiac Enzymes:  Recent Labs Lab 12/05/15 2121 12/11/15 1641  TROPONINI <0.03 <0.03   BNP (last 3 results) No results for input(s): BNP in the last 8760 hours.  ProBNP (last 3 results) No results for input(s): PROBNP in the last 8760 hours.    Studies: No results found.  Scheduled Meds: . ALPRAZolam  1 mg Oral QID  . amphetamine-dextroamphetamine  10 mg Oral 3 times per day  . aspirin EC  81 mg Oral Daily  . celecoxib  100 mg Oral Daily  . citalopram  20 mg Oral Daily  . enoxaparin (LOVENOX) injection  40 mg Subcutaneous Q24H  . fluticasone  1 spray Each Nare QHS  . hydrochlorothiazide  12.5 mg Oral Daily  . lisinopril  20 mg Oral Daily  . methocarbamol  750 mg Oral QID  . nicotine  21 mg Transdermal Daily  . pantoprazole  40 mg Oral BID  . pregabalin  150 mg Oral TID  . sucralfate  1 g Oral BID      Time spent: 25 min  [facility]   [facility] Pager [phone]. If 7PM-7AM, please contact night-coverage at [email or web address], Office  [phone]  password [email or web address] 12/12/2015, 3:26 PM  LOS: 0 days

## 2015-12-12 NOTE — ACP (Advance Care Planning) (Signed)
Discussed Advance Directives information. Asked her to set up an appointment after discharge.

## 2015-12-12 NOTE — Progress Notes (Signed)
Charge nurse spoke with pt about hospital being a nonsmoking facility due to complaints of smoke coming from room. Will continue to monitor pt.

## 2015-12-12 NOTE — Discharge Summary (Signed)
[facility] Discharge Summary  Danielle HeathMelissa B [name] 05/06/1970 45 y.o. female  [id number]  Admission Date: 12/05/2015  Discharge Date: 12/06/2015  Physician: Fabienne Brunsharles Fields, MD  Admission Diagnosis: No pulse [R09.89] Critical ischemia of upper extremity [I99.8]  HPI:   This is a 45 y.o. female who [facility] approximately 7 hr history of pain numbness and tingling left upper extremity.  Pt took some muscle relaxants earlier this evening fell asleep and when woke up pain and numbness was present.  She denies history of chest pain or dyspnea.  She has no history of afib but has a son with aortic stenosis and another with afib.  She states recently she has had some decline of her liver and kidneys but does not know any details.  She smokes 2 ppd. She denies history of IV drug abuse.  Other medical problems include chronic back pain hypertension elevated cholesterol.  She stopped taking her cholesterol med.  Hospital Course:  The [facility] and started on heparin. The patient was admitted to the hospital and taken to the operating room on 12/06/15 and underwent left brachial, ulnar and radial artery embolectomy. Operative findings included acute embolus brachial bifurcation and distal radial and ulnar arteries.   The patient tolerated the procedure well and was transported to the PACU in good condition.   She continued on heparin post-operatively.   POD 1: She had palpable left radial pulse and brisk doppler flow of radial, ulnar and palmar arch. Her forearm was soft. She still had some numbness and tingling of her left hand. She was hydrated for polycythemia. Her fever was likely atelectasis. Her urine drug screen was positive for THC. Her echo was pending. Hypokalemia repleted. She was transferred to the floor.   POD 2: She denied any pain or numbness of her left hand. Her ECHO was negative for cardiac  source of embolus. Her polycythemia was improved. Her potassium was repleted. She was started on aspirin. She was discharged home on POD 2 in good condition.    CBC    Component Value Date/Time   WBC 11.5 (H) 12/12/2015 0513   RBC 4.93 12/12/2015 0513   HGB 16.4 (H) 12/12/2015 0513   HCT 46.5 (H) 12/12/2015 0513   PLT 259 12/12/2015 0513   MCV 94.3 12/12/2015 0513   MCH 33.3 12/12/2015 0513   MCHC 35.3 12/12/2015 0513   RDW 13.1 12/12/2015 0513   LYMPHSABS 2.9 12/11/2015 1641   MONOABS 1.1 (H) 12/11/2015 1641   EOSABS 0.1 12/11/2015 1641   BASOSABS 0.0 12/11/2015 1641    BMET    Component Value Date/Time   NA 138 12/12/2015 0513   K 3.1 (L) 12/12/2015 0513   CL 103 12/12/2015 0513   CO2 26 12/12/2015 0513   GLUCOSE 91 12/12/2015 0513   BUN 15 12/12/2015 0513   CREATININE 0.82 12/12/2015 0513   CALCIUM 8.6 (L) 12/12/2015 0513   GFRNONAA >60 12/12/2015 0513   GFRAA >60 12/12/2015 0513     Discharge Instructions:   The patient is discharged to home with extensive instructions on wound care and progressive ambulation.  They are instructed not to drive or perform any heavy lifting until returning to see the physician in his office.  Discharge Instructions    Activity as tolerated - No restrictions    Complete by:  As directed    Call MD for:  redness, tenderness, or signs of infection (pain,  swelling, bleeding, redness, odor or green/yellow discharge around incision site)    Complete by:  As directed    Call MD for:  severe or increased pain, loss or decreased feeling  in affected limb(s)    Complete by:  As directed    Call MD for:  temperature >100.5    Complete by:  As directed    Discharge wound care:    Complete by:  As directed    Wash left arm wound daily with soap and water and pat dry. Do not pull off the skin glue. It will peel off on its own.   Driving Restrictions    Complete by:  As directed    No driving while on pain medication   Resume previous diet     Complete by:  As directed       Discharge Diagnosis:  No pulse [R09.89] Critical ischemia of upper extremity [I99.8]  Secondary Diagnosis: Patient Active Problem List   Diagnosis Date Noted  . Hypokalemia 12/11/2015  . Hyponatremia 12/11/2015  . History of embolectomy of left arm.  12/11/2015  . Depression with anxiety 12/11/2015  . Essential hypertension 12/11/2015  . Chronic back pain 12/11/2015  . Ischemia of hand 12/06/2015  . 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 Knee pain 05/31/2012  . Bilateral leg weakness 11/20/2010   Past Medical History:  Diagnosis Date  . Anxiety   . Back pain   . Chronic back pain 02/04/13   By patient report  . Depression   . Factor 5 Leiden mutation, heterozygous (HCC)   . Hypertension   . Laryngitis, chronic   . Panic attack   . Scoliosis   . Ulcer (HCC)        Medication List    TAKE these medications   albuterol 108 (90 Base) MCG/ACT inhaler Commonly known as:  PROVENTIL HFA;VENTOLIN HFA Inhale 2 puffs into the lungs every 4 (four) hours as needed for wheezing or shortness of breath.   ALPRAZolam 1 MG tablet Commonly known as:  XANAX Take 1 tablet (1 mg total) by mouth 4 (four) times daily. What changed:  how much to take   amphetamine-dextroamphetamine 10 MG tablet Commonly known as:  ADDERALL Take 10 mg by mouth 3 (three) times daily. 8a, 12p, 3p   aspirin 81 MG EC tablet Take 1 tablet (81 mg total) by mouth daily.   celecoxib 100 MG capsule Commonly known as:  CELEBREX Take 100 mg by mouth daily.   citalopram 20 MG tablet Commonly known as:  CELEXA Take 20 mg by mouth daily.   fluticasone 50 MCG/ACT nasal spray Commonly known as:  FLONASE Place 1 spray into both nostrils at bedtime.   lisinopril-hydrochlorothiazide 20-25 MG tablet Commonly known as:  PRINZIDE,ZESTORETIC Take 1 tablet by mouth daily.   methocarbamol 750 MG tablet Commonly known as:   ROBAXIN Take 750 mg by mouth 4 (four) times daily.   oxyCODONE-acetaminophen 7.5-325 MG tablet Commonly known as:  PERCOCET Take 1 tablet by mouth 3 (three) times daily as needed for severe pain.   pantoprazole 40 MG tablet Commonly known as:  PROTONIX Take 40 mg by mouth 2 (two) times daily.   potassium chloride 10 MEQ tablet Commonly known as:  K-DUR Take 10 mEq by mouth daily.   pregabalin 150 MG capsule Commonly known as:  LYRICA Take 150 mg by mouth 3 (three) times daily.   sucralfate 1 g tablet Commonly known as:  CARAFATE Take 1 tablet (1 g total) by mouth 2 (two) times daily.       Disposition: home  Patient's condition: is Good  Follow up: 1. Dr. Darrick Penna in 2-3 weeks   Maris Berger, PA-C [facility] [phone] 12/12/2015  12:35 PM

## 2015-12-13 LAB — BASIC METABOLIC PANEL
Anion gap: 7 (ref 5–15)
BUN: 16 mg/dL (ref 6–20)
CHLORIDE: 103 mmol/L (ref 101–111)
CO2: 28 mmol/L (ref 22–32)
CREATININE: 0.9 mg/dL (ref 0.44–1.00)
Calcium: 8.6 mg/dL — ABNORMAL LOW (ref 8.9–10.3)
GFR calc Af Amer: >60 mL/min (ref >60)
GLUCOSE: 110 mg/dL — AB (ref 65–99)
Potassium: 3.3 mmol/L — ABNORMAL LOW (ref 3.5–5.1)
SODIUM: 138 mmol/L (ref 135–145)

## 2015-12-13 MED ORDER — POTASSIUM CHLORIDE CRYS ER 20 MEQ PO TBCR
40.0000 meq | EXTENDED_RELEASE_TABLET | Freq: Once | ORAL | Status: AC
  Administered 2015-12-13: 40 meq via ORAL
  Filled 2015-12-13: qty 2

## 2015-12-13 MED ORDER — LISINOPRIL 20 MG PO TABS: 20.0000 mg | ORAL_TABLET | Freq: Every day | ORAL | 2 refills | Status: DC

## 2015-12-13 MED ORDER — AMLODIPINE BESYLATE 5 MG PO TABS: 5.0000 mg | ORAL_TABLET | Freq: Every day | ORAL | 3 refills | Status: DC

## 2015-12-13 NOTE — Progress Notes (Signed)
Patient discharged home.  IV removed - WNL.  Reviewed medications and DC instructions.  Instructed to follow up with PCP within 2 weeks.  Verbalizes understanding.  No questions at this time.  Assisted off unit in NAD.

## 2015-12-13 NOTE — Discharge Summary (Signed)
Physician Discharge Summary  JOURI THREAT ZOX:[id number] DOB: 1970-11-24 DOA: 12/11/2015  PCP: Milana Obey, MD  Admit date: 12/11/2015 Discharge date: 12/13/2015  Time spent:  minutes  Recommendations for Outpatient Follow-up:  1. Follow up PCP in 2 weeks   Discharge Diagnoses:  Principal Problem:   Hypokalemia Active Problems:   Hyponatremia   History of embolectomy of left arm.    Depression with anxiety   Essential hypertension   Chronic back pain   Discharge Condition: Stable  Diet recommendation: heart healthy diet  Filed Weights   12/11/15 1613 12/11/15 2048  Weight: 81.2 kg (179 lb) 82.8 kg (182 lb 9.6 oz)    History of present illness:  45 y.o.femalewith medical history significant of anxiety, chronic back pain, depression, anxiety, panic attacks,heterozygous factor V Leyden mutation, hypertension, chronic laryngitis who just underwent a left arm embolectomy after being admitted for acute left upper extremity ischemia last week and was recovering well until today when she started developing generalized weakness, body aches and muscle cramps. They are worse in the back and hips. She denies fever, chills, abdominal pain, nausea, emesis, diarrhea, dysuria or hematuria  Hospital Course:  1. Hypokalemia- potassium improved to 3.3,  will give K-Dur 40 mg by mouth once before discharge. Will discontinue HCTZ at this time. 2. Hyponatremia- resolved, today sodium is 138 3. History of embolectomy of left arm-continue aspirin, patient to follow-up vascular surgery as scheduled. 4. Depression with anxiety-continue alprazolam, Celexa 5. Attention deficit disorder- continue Adderall 6. Hypertension- discontinue HCTZ due to electrolyte abnormalities, continue lisinopril 20 mg po daily. Will start amlodipine 5 mg po daily.Blood pressure stable.  Procedures:  None   Consultations:  None   Discharge Exam: Vitals:   12/12/15 2058 12/13/15 0554  BP: 127/71  138/78  Pulse: 77 66  Resp: 18 18  Temp: 98 F (36.7 C) 97.3 F (36.3 C)    General: Appear in no acute distress Cardiovascular: RRR, S1S2 Respiratory: Clear bilaterally  Discharge Instructions   Discharge Instructions    Diet - low sodium heart healthy    Complete by:  As directed    Increase activity slowly    Complete by:  As directed      Current Discharge Medication List    START taking these medications   Details  amLODipine (NORVASC) 5 MG tablet Take 1 tablet (5 mg total) by mouth daily. Qty: 30 tablet, Refills: 3    lisinopril (PRINIVIL,ZESTRIL) 20 MG tablet Take 1 tablet (20 mg total) by mouth daily. Qty: 30 tablet, Refills: 2      CONTINUE these medications which have NOT CHANGED   Details  albuterol (PROVENTIL HFA;VENTOLIN HFA) 108 (90 BASE) MCG/ACT inhaler Inhale 2 puffs into the lungs every 4 (four) hours as needed for wheezing or shortness of breath. Qty: 1 Inhaler, Refills: 0    ALPRAZolam (XANAX) 1 MG tablet Take 1 tablet (1 mg total) by mouth 4 (four) times daily. Qty: 30 tablet, Refills: 0    amphetamine-dextroamphetamine (ADDERALL) 10 MG tablet Take 10 mg by mouth 3 (three) times daily. 8a, 12p, 3p    aspirin EC 81 MG EC tablet Take 1 tablet (81 mg total) by mouth daily.    celecoxib (CELEBREX) 100 MG capsule Take 100 mg by mouth daily.    citalopram (CELEXA) 20 MG tablet Take 20 mg by mouth daily.     fluticasone (FLONASE) 50 MCG/ACT nasal spray Place 1 spray into both nostrils at bedtime.    methocarbamol (ROBAXIN)  750 MG tablet Take 750 mg by mouth 4 (four) times daily.      oxyCODONE-acetaminophen (PERCOCET) 7.5-325 MG tablet Take 1 tablet by mouth 3 (three) times daily as needed for severe pain.    pantoprazole (PROTONIX) 40 MG tablet Take 40 mg by mouth 2 (two) times daily.     pregabalin (LYRICA) 150 MG capsule Take 150 mg by mouth 3 (three) times daily.     sucralfate (CARAFATE) 1 g tablet Take 1 tablet (1 g total) by mouth 2 (two)  times daily.      STOP taking these medications     lisinopril-hydrochlorothiazide (PRINZIDE,ZESTORETIC) 20-25 MG per tablet      potassium chloride (K-DUR) 10 MEQ tablet        Allergies  Allergen Reactions  . Penicillins Anaphylaxis    Has patient had a PCN reaction causing immediate rash, facial/tongue/throat swelling, SOB or lightheadedness with hypotension: No Has patient had a PCN reaction causing severe rash involving mucus membranes or skin necrosis: No Has patient had a PCN reaction that required hospitalization No Has patient had a PCN reaction occurring within the last 10 years: No If all of the above answers are "NO", then may proceed with Cephalosporin use.   . Ibuprofen Nausea And Vomiting    GI upset, Shakes  . Peppermint Flavor [Flavoring Agent]     Allergic to pepper the spice  . Aspirin Nausea And Vomiting    Gi upset   Follow-up Information    Milana ObeyStephen D Knowlton, MD. Schedule an appointment as soon as possible for a visit in 2 week(s).   Specialty:  Family Medicine Contact information: [address] [place] [address] [phone]            The results of significant diagnostics from this hospitalization (including imaging, microbiology, ancillary and laboratory) are listed below for reference.    Significant Diagnostic Studies: Ct Angio Up Extrem Left W &/or Wo Contast  Result Date: 12/05/2015 CLINICAL DATA:  Left arm cold and pulseless, with prolonged capillary refill, acute onset. Initial encounter. EXAM: CT ANGIOGRAPHY OF THE LEFT UPPER EXTREMITY TECHNIQUE: Multidetector CT imaging of the left upper extremity was performed using the standard protocol during bolus administration of intravenous contrast. Multiplanar CT image reconstructions and MIPs were obtained to evaluate the vascular anatomy. CONTRAST:  60 mL of Isovue 370 IV contrast COMPARISON:  None. 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 It occludes again 5 cm distal to this point, near its bifurcation to the radial and ulnar arteries. No definite arterial flow is characterized about the distal forearm and hand. The venous vasculature is not well assessed given the phase of contrast enhancement. The mediastinum is unremarkable appearance. The visualized portions of the lungs are clear. Minimal hypodensity at the right thyroid lobe is likely benign, given its size. The visualized portions of the abdomen and pelvis are grossly unremarkable. No acute osseous abnormalities are seen. The patient is status post lumbar spinal fusion, with associated decompression. Cervical spinal fusion hardware is also noted. Review of the MIP images confirms the above findings. 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 It occludes again 5 cm distal to the site of reconstitution, near its bifurcation to the radial and ulnar arteries. Critical Value/emergent results were called by telephone at the time of interpretation on 12/05/2015 at 10:40  pm to Dr. Eber Hong, who verbally acknowledged these results. Electronically Signed   By: Roanna Raider M.D.   On: 12/05/2015 22:45    Microbiology: Recent Results (from the past 240 hour(s))  MRSA PCR Screening     Status: None   Collection Time: 12/06/15  4:02 AM  Result Value Ref Range Status   MRSA by PCR NEGATIVE NEGATIVE Final    Comment:        The GeneXpert MRSA Assay (FDA approved for NASAL specimens only), is one component of a comprehensive MRSA colonization surveillance program. It is not intended to diagnose MRSA infection nor to guide or monitor treatment for MRSA infections.      Labs: Basic Metabolic Panel:  Recent Labs Lab 12/07/15 0158 12/07/15 0550 12/11/15 1641 12/11/15 1900 12/12/15 0513  12/13/15 0459  NA 139 139 134*  --  138 138  K 2.4* 2.9* 2.3*  --  3.1* 3.3*  CL 101 102 95*  --  103 103  CO2 28 29 28   --  26 28  GLUCOSE 115* 106* 124*  --  91 110*  BUN 7 8 15   --  15 16  CREATININE 0.89 0.84 0.96  --  0.82 0.90  CALCIUM 8.1* 8.1* 9.0  --  8.6* 8.6*  MG  --   --   --  2.0  --   --   PHOS  --   --   --  3.6  --   --    Liver Function Tests:  Recent Labs Lab 12/12/15 0513  AST 13*  ALT 22  ALKPHOS 87  BILITOT 0.5  PROT 6.5  ALBUMIN 3.6   CBC:  Recent Labs Lab 12/07/15 0158 12/11/15 1641 12/12/15 0513  WBC 11.9* 11.6* 11.5*  NEUTROABS  --  7.4  --   HGB 14.7 18.6* 16.4*  HCT 41.4 50.9* 46.5*  MCV 91.8 92.7 94.3  PLT 169 293 259   Cardiac Enzymes:  Recent Labs Lab 12/11/15 1641  TROPONINI <0.03    CBG:  Recent Labs Lab 12/11/15 1615  GLUCAP 178*       Signed:  Alaster Asfaw S MD.  [facility] 12/13/2015, 10:27 AM

## 2016-01-03 ENCOUNTER — Encounter: Payer: Self-pay | Admitting: Vascular Surgery

## 2016-01-16 ENCOUNTER — Encounter: Payer: Medicaid Other | Admitting: Vascular Surgery

## 2016-08-30 ENCOUNTER — Emergency Department (HOSPITAL_COMMUNITY): Payer: Medicaid Other

## 2016-08-30 ENCOUNTER — Encounter (HOSPITAL_COMMUNITY): Payer: Self-pay | Admitting: *Deleted

## 2016-08-30 ENCOUNTER — Observation Stay (HOSPITAL_COMMUNITY)
Admission: EM | Admit: 2016-08-30 | Discharge: 2016-09-01 | Disposition: A | Discharge status: Home / Self Care | Payer: Medicaid Other | Attending: Internal Medicine | Admitting: Internal Medicine

## 2016-08-30 DIAGNOSIS — R1013 Epigastric pain: Secondary | ICD-10-CM | POA: Diagnosis present

## 2016-08-30 DIAGNOSIS — G894 Chronic pain syndrome: Secondary | ICD-10-CM | POA: Diagnosis not present

## 2016-08-30 DIAGNOSIS — Z79899 Other long term (current) drug therapy: Secondary | ICD-10-CM | POA: Diagnosis not present

## 2016-08-30 DIAGNOSIS — F419 Anxiety disorder, unspecified: Secondary | ICD-10-CM | POA: Insufficient documentation

## 2016-08-30 DIAGNOSIS — K5900 Constipation, unspecified: Secondary | ICD-10-CM | POA: Insufficient documentation

## 2016-08-30 DIAGNOSIS — F17210 Nicotine dependence, cigarettes, uncomplicated: Secondary | ICD-10-CM | POA: Diagnosis not present

## 2016-08-30 DIAGNOSIS — I110 Hypertensive heart disease with heart failure: Secondary | ICD-10-CM | POA: Diagnosis not present

## 2016-08-30 DIAGNOSIS — R112 Nausea with vomiting, unspecified: Secondary | ICD-10-CM | POA: Diagnosis not present

## 2016-08-30 DIAGNOSIS — Q741 Congenital malformation of knee: Secondary | ICD-10-CM | POA: Insufficient documentation

## 2016-08-30 DIAGNOSIS — Z7982 Long term (current) use of aspirin: Secondary | ICD-10-CM | POA: Diagnosis not present

## 2016-08-30 DIAGNOSIS — E876 Hypokalemia: Secondary | ICD-10-CM | POA: Insufficient documentation

## 2016-08-30 DIAGNOSIS — R531 Weakness: Secondary | ICD-10-CM | POA: Diagnosis not present

## 2016-08-30 DIAGNOSIS — R0789 Other chest pain: Secondary | ICD-10-CM | POA: Insufficient documentation

## 2016-08-30 DIAGNOSIS — F418 Other specified anxiety disorders: Secondary | ICD-10-CM | POA: Diagnosis present

## 2016-08-30 DIAGNOSIS — I10 Essential (primary) hypertension: Secondary | ICD-10-CM | POA: Diagnosis present

## 2016-08-30 LAB — URINALYSIS, ROUTINE W REFLEX MICROSCOPIC
Bilirubin Urine: NEGATIVE
Glucose, UA: NEGATIVE mg/dL
Hgb urine dipstick: NEGATIVE
KETONES UR: NEGATIVE mg/dL
Leukocytes, UA: NEGATIVE
NITRITE: NEGATIVE
Protein, ur: NEGATIVE mg/dL
SPECIFIC GRAVITY, URINE: 1.011 (ref 1.005–1.030)
pH: 7 (ref 5.0–8.0)

## 2016-08-30 LAB — COMPREHENSIVE METABOLIC PANEL
ALT: 20 U/L (ref 14–54)
AST: 24 U/L (ref 15–41)
Albumin: 4.2 g/dL (ref 3.5–5.0)
Alkaline Phosphatase: 132 U/L — ABNORMAL HIGH (ref 38–126)
Anion gap: 13 (ref 5–15)
BUN: 11 mg/dL (ref 6–20)
CHLORIDE: 98 mmol/L — AB (ref 101–111)
CO2: 28 mmol/L (ref 22–32)
Calcium: 9.5 mg/dL (ref 8.9–10.3)
Creatinine, Ser: 0.87 mg/dL (ref 0.44–1.00)
Glucose, Bld: 151 mg/dL — ABNORMAL HIGH (ref 65–99)
POTASSIUM: 2.6 mmol/L — AB (ref 3.5–5.1)
Sodium: 139 mmol/L (ref 135–145)
Total Bilirubin: 1.2 mg/dL (ref 0.3–1.2)
Total Protein: 7.9 g/dL (ref 6.5–8.1)

## 2016-08-30 LAB — CBC WITH DIFFERENTIAL/PLATELET
BASOS ABS: 0 10*3/uL (ref 0.0–0.1)
Basophils Relative: 0 %
EOS ABS: 0 10*3/uL (ref 0.0–0.7)
EOS PCT: 0 %
HCT: 51.1 % — ABNORMAL HIGH (ref 36.0–46.0)
Hemoglobin: 19.1 g/dL — ABNORMAL HIGH (ref 12.0–15.0)
Lymphocytes Relative: 15 %
Lymphs Abs: 2.3 10*3/uL (ref 0.7–4.0)
MCH: 33.6 pg (ref 26.0–34.0)
MCHC: 37.4 g/dL — ABNORMAL HIGH (ref 30.0–36.0)
MCV: 90 fL (ref 78.0–100.0)
Monocytes Absolute: 0.8 10*3/uL (ref 0.1–1.0)
Monocytes Relative: 5 %
Neutro Abs: 12.5 10*3/uL — ABNORMAL HIGH (ref 1.7–7.7)
Neutrophils Relative %: 80 %
PLATELETS: 346 10*3/uL (ref 150–400)
RBC: 5.68 MIL/uL — AB (ref 3.87–5.11)
RDW: 13.3 % (ref 11.5–15.5)
WBC: 15.6 10*3/uL — ABNORMAL HIGH (ref 4.0–10.5)

## 2016-08-30 LAB — LIPASE, BLOOD: LIPASE: 34 U/L (ref 11–51)

## 2016-08-30 LAB — MAGNESIUM: MAGNESIUM: 2 mg/dL (ref 1.7–2.4)

## 2016-08-30 LAB — PREGNANCY, URINE: PREG TEST UR: NEGATIVE

## 2016-08-30 LAB — TROPONIN I

## 2016-08-30 MED ORDER — ALPRAZOLAM 1 MG PO TABS
1.0000 mg | ORAL_TABLET | Freq: Four times a day (QID) | ORAL | Status: DC
  Administered 2016-08-31 – 2016-09-01 (×6): 1 mg via ORAL
  Filled 2016-08-30 (×6): qty 1

## 2016-08-30 MED ORDER — CELECOXIB 100 MG PO CAPS
100.0000 mg | ORAL_CAPSULE | Freq: Every day | ORAL | Status: DC
  Filled 2016-08-30: qty 1

## 2016-08-30 MED ORDER — LISINOPRIL 10 MG PO TABS
20.0000 mg | ORAL_TABLET | Freq: Every day | ORAL | Status: DC
  Administered 2016-08-31 – 2016-09-01 (×2): 20 mg via ORAL
  Filled 2016-08-30 (×2): qty 2

## 2016-08-30 MED ORDER — PREGABALIN 75 MG PO CAPS
150.0000 mg | ORAL_CAPSULE | Freq: Three times a day (TID) | ORAL | Status: DC
  Administered 2016-08-31 – 2016-09-01 (×5): 150 mg via ORAL
  Filled 2016-08-30 (×5): qty 2

## 2016-08-30 MED ORDER — PANTOPRAZOLE SODIUM 40 MG PO TBEC
40.0000 mg | DELAYED_RELEASE_TABLET | Freq: Two times a day (BID) | ORAL | Status: DC
  Administered 2016-08-31 – 2016-09-01 (×4): 40 mg via ORAL
  Filled 2016-08-30 (×4): qty 1

## 2016-08-30 MED ORDER — KETOROLAC TROMETHAMINE 30 MG/ML IJ SOLN
30.0000 mg | Freq: Once | INTRAMUSCULAR | Status: AC
Start: 2016-08-30 — End: 2016-08-30
  Administered 2016-08-30: 30 mg via INTRAVENOUS
  Filled 2016-08-30: qty 1

## 2016-08-30 MED ORDER — POTASSIUM CHLORIDE 10 MEQ/100ML IV SOLN
10.0000 meq | Freq: Once | INTRAVENOUS | Status: AC
  Administered 2016-08-30: 10 meq via INTRAVENOUS
  Filled 2016-08-30: qty 100

## 2016-08-30 MED ORDER — POTASSIUM CHLORIDE CRYS ER 20 MEQ PO TBCR
40.0000 meq | EXTENDED_RELEASE_TABLET | Freq: Every day | ORAL | Status: DC
  Administered 2016-08-31 – 2016-09-01 (×2): 40 meq via ORAL
  Filled 2016-08-30 (×2): qty 2

## 2016-08-30 MED ORDER — POTASSIUM CHLORIDE CRYS ER 20 MEQ PO TBCR
40.0000 meq | EXTENDED_RELEASE_TABLET | Freq: Once | ORAL | Status: AC
  Administered 2016-08-30: 40 meq via ORAL
  Filled 2016-08-30: qty 2

## 2016-08-30 MED ORDER — IOPAMIDOL (ISOVUE-300) INJECTION 61%
100.0000 mL | Freq: Once | INTRAVENOUS | Status: AC | PRN
  Administered 2016-08-30: 100 mL via INTRAVENOUS

## 2016-08-30 MED ORDER — FLUTICASONE PROPIONATE 50 MCG/ACT NA SUSP
1.0000 | Freq: Every day | NASAL | Status: DC
  Filled 2016-08-30: qty 16

## 2016-08-30 MED ORDER — BISACODYL 5 MG PO TBEC: 5.0000 mg | DELAYED_RELEASE_TABLET | Freq: Every day | ORAL | Status: DC | PRN

## 2016-08-30 MED ORDER — SODIUM CHLORIDE 0.9% FLUSH
3.0000 mL | Freq: Two times a day (BID) | INTRAVENOUS | Status: DC
  Administered 2016-08-31: 3 mL via INTRAVENOUS

## 2016-08-30 MED ORDER — SODIUM CHLORIDE 0.9 % IV BOLUS (SEPSIS)
1000.0000 mL | Freq: Once | INTRAVENOUS | Status: AC
  Administered 2016-08-30: 1000 mL via INTRAVENOUS

## 2016-08-30 MED ORDER — FAMOTIDINE IN NACL 20-0.9 MG/50ML-% IV SOLN
20.0000 mg | INTRAVENOUS | Status: AC
  Administered 2016-08-30: 20 mg via INTRAVENOUS
  Filled 2016-08-30: qty 50

## 2016-08-30 MED ORDER — SUCRALFATE 1 G PO TABS
1.0000 g | ORAL_TABLET | Freq: Two times a day (BID) | ORAL | Status: DC
  Administered 2016-08-31 – 2016-09-01 (×4): 1 g via ORAL
  Filled 2016-08-30 (×4): qty 1

## 2016-08-30 MED ORDER — ASPIRIN EC 81 MG PO TBEC
81.0000 mg | DELAYED_RELEASE_TABLET | Freq: Every day | ORAL | Status: DC
  Administered 2016-08-31 – 2016-09-01 (×2): 81 mg via ORAL
  Filled 2016-08-30 (×2): qty 1

## 2016-08-30 MED ORDER — ENOXAPARIN SODIUM 40 MG/0.4ML ~~LOC~~ SOLN
40.0000 mg | SUBCUTANEOUS | Status: DC
  Administered 2016-08-31 (×2): 40 mg via SUBCUTANEOUS
  Filled 2016-08-30 (×2): qty 0.4

## 2016-08-30 MED ORDER — MAGNESIUM SULFATE 2 GM/50ML IV SOLN
2.0000 g | INTRAVENOUS | Status: AC
  Administered 2016-08-30: 2 g via INTRAVENOUS
  Filled 2016-08-30: qty 50

## 2016-08-30 MED ORDER — GI COCKTAIL ~~LOC~~
30.0000 mL | Freq: Once | ORAL | Status: AC
  Administered 2016-08-30: 30 mL via ORAL
  Filled 2016-08-30: qty 30

## 2016-08-30 MED ORDER — KCL IN DEXTROSE-NACL 40-5-0.9 MEQ/L-%-% IV SOLN
INTRAVENOUS | Status: DC
  Administered 2016-08-31 – 2016-09-01 (×4): via INTRAVENOUS

## 2016-08-30 MED ORDER — PANTOPRAZOLE SODIUM 40 MG IV SOLR
40.0000 mg | Freq: Once | INTRAVENOUS | Status: AC
  Administered 2016-08-30: 40 mg via INTRAVENOUS
  Filled 2016-08-30: qty 40

## 2016-08-30 MED ORDER — POTASSIUM CHLORIDE 10 MEQ/100ML IV SOLN
10.0000 meq | INTRAVENOUS | Status: AC
  Administered 2016-08-31 (×2): 10 meq via INTRAVENOUS
  Filled 2016-08-30 (×2): qty 100

## 2016-08-30 NOTE — ED Provider Notes (Signed)
[facility]-EMERGENCY [facility]PT Provider Note   CSN: [id number] Arrival date & time: 08/30/16  1636     History   Chief Complaint Chief Complaint  Patient presents with  . Abdominal Pain    HPI [name] is a 46 y.o. female.  HPI  The pt is a 46 y/o femal Hx of chronic pain and fibromyalgia Presents with c/o epigastric pain that is present for 4 days - it has been in the same location constantly, has been worse with laying down - preventing her from eating much - she has no changes in stools other than her constipation for 7 days - has had no blood in stools, she has some assocaited mid and low back pain and chest pressure.  I took care of the patient several months ago when she had acute onset of an arterial occlusion of the LUE - that required vascular surgery consultation and heparin.  She is now much better and has no arm symtpoms.  She has had 4 days of epigastric pain - worse with supine position, assocaited with n/v.  She denies hx of pancreatitis, ETOH use.  She does take nsaids including ASA 81 and celebrex for her chronic arthritis.  Past Medical History:  Diagnosis Date  . Anxiety   . Back pain   . Chronic back pain 02/04/13   By patient report  . Depression   . Factor 5 Leiden mutation, heterozygous (HCC)   . Hypertension   . Laryngitis, chronic   . Panic attack   . Scoliosis   . Ulcer     Patient Active Problem List   Diagnosis Date Noted  . Hypokalemia 12/11/2015  . Hyponatremia 12/11/2015  . History of embolectomy of left arm.  12/11/2015  . Depression with anxiety 12/11/2015  . Essential hypertension 12/11/2015  . Chronic back pain 12/11/2015  . Ischemia of hand 12/06/2015  . 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 Knee pain 05/31/2012  . Bilateral leg weakness 11/20/2010    Past Surgical History:  Procedure Laterality Date  . BACK SURGERY    . CESAREAN SECTION    . CHOLECYSTECTOMY  11/27/2011   Procedure: LAPAROSCOPIC CHOLECYSTECTOMY;  Surgeon: Fabio Bering, MD;  Location: [facility];  Service: General;  Laterality: N/A;  . EMBOLECTOMY Left 12/06/2015   Procedure: EMBOLECTOMY LEFT ARM;  Surgeon: Sherren Kerns, MD;  Location: [facility] OR;  Service: Vascular;  Laterality: Left;  . NECK SURGERY    . TUBAL LIGATION      OB History    Gravida Para Term Preterm AB Living   4 3 3   1 3    SAB TAB Ectopic Multiple Live Births                   Home Medications    Prior to Admission medications   Medication Sig Start Date End Date Taking? Authorizing Provider  albuterol (PROVENTIL HFA;VENTOLIN HFA) 108 (90 BASE) MCG/ACT inhaler Inhale 2 puffs into the lungs every 4 (four) hours as needed for wheezing or shortness of breath. 05/03/14  Yes Ward, Layla Maw, DO  ALPRAZolam (XANAX) 1 MG tablet Take 1 tablet (1 mg total) by mouth 4 (four) times daily. 12/07/15  Yes Maris Berger A, PA-C  amphetamine-dextroamphetamine (ADDERALL) 10 MG tablet Take 10 mg by mouth 3 (three) times daily. 8a, 12p, 3p    [provider]  aspirin EC 81 MG EC tablet Take 1 tablet (81 mg total)  by mouth daily. 12/08/15   Raymond Gurney, PA-C  celecoxib (CELEBREX) 100 MG capsule Take 100 mg by mouth daily.    [provider]  citalopram (CELEXA) 20 MG tablet Take 20 mg by mouth daily.     [provider]  fluticasone (FLONASE) 50 MCG/ACT nasal spray Place 1 spray into both nostrils at bedtime.    [provider]  lisinopril (PRINIVIL,ZESTRIL) 20 MG tablet Take 1 tablet (20 mg total) by mouth daily. 12/14/15   Meredeth Ide, MD  methocarbamol (ROBAXIN) 750 MG tablet Take 750 mg by mouth 4 (four) times daily.      [provider]  oxyCODONE-acetaminophen (PERCOCET) 7.5-325 MG tablet Take 1 tablet by mouth 3 (three) times daily as needed for severe pain.    [provider]  pantoprazole (PROTONIX) 40 MG tablet Take 40 mg by mouth 2 (two) times daily.     [provider]  pregabalin (LYRICA) 150 MG capsule Take 150 mg by mouth 3 (three) times daily.     [provider]  sucralfate (CARAFATE) 1 g tablet Take 1 tablet (1 g total) by mouth 2 (two) times daily. 12/07/15   Raymond Gurney, PA-C    Family History Family History  Problem Relation Age of Onset  . Heart disease Unknown   . Arthritis Unknown   . Lung disease Unknown   . Cancer Unknown   . Asthma Unknown   . Diabetes Unknown     Social History Social History  Substance Use Topics  . Smoking status: Current Every Day Smoker    Packs/day: 1.50    Years: 25.00    Types: Cigarettes  . Smokeless tobacco: Never Used  . Alcohol use No     Allergies   Penicillins; Ibuprofen; Peppermint flavor [flavoring agent]; and Aspirin   Review of Systems Review of Systems  All other systems reviewed and are negative.    Physical Exam Updated Vital Signs BP (!) 148/78   Pulse 80   Resp 18   Ht 5\' 2"  (1.575 m)   Wt 97.1 kg (214 lb)   SpO2 96%   BMI 39.14 kg/m   Physical Exam  Constitutional: She appears well-developed and well-nourished. No distress.  HENT:  Head: Normocephalic and atraumatic.  Mouth/Throat: Oropharynx is clear and moist. No oropharyngeal exudate.  Eyes: Pupils are equal, round, and reactive to light. Conjunctivae and EOM are normal. Right eye exhibits no discharge. Left eye exhibits no discharge. No scleral icterus.  Neck: Normal range of motion. Neck supple. No JVD present. No thyromegaly present.  Cardiovascular: Normal rate, regular rhythm, normal heart sounds and intact distal pulses.  Exam reveals no gallop and no friction rub.   No murmur heard. Pulmonary/Chest: Effort normal and breath sounds normal. No respiratory distress. She has no wheezes. She has no rales.  Abdominal: Soft. Bowel sounds are normal. She exhibits no distension and no mass. There is tenderness ( epigastric ttp without any peirtoneal signs.).  Musculoskeletal: Normal  range of motion. She exhibits no edema or tenderness.  Lymphadenopathy:    She has no cervical adenopathy.  Neurological: She is alert. Coordination normal.  Skin: Skin is warm and dry. No rash noted. No erythema.  Psychiatric: She has a normal mood and affect. Her behavior is normal.  Nursing note and vitals reviewed.    ED Treatments / Results  Labs (all labs ordered are listed, but only abnormal results are displayed) Labs Reviewed  COMPREHENSIVE METABOLIC PANEL -  Abnormal; Notable for the following:       Result Value   Potassium 2.6 (*)    Chloride 98 (*)    Glucose, Bld 151 (*)    Alkaline Phosphatase 132 (*)    All other components within normal limits  URINALYSIS, ROUTINE W REFLEX MICROSCOPIC - Abnormal; Notable for the following:    APPearance HAZY (*)    All other components within normal limits  CBC WITH DIFFERENTIAL/PLATELET - Abnormal; Notable for the following:    WBC 15.6 (*)    RBC 5.68 (*)    Hemoglobin 19.1 (*)    HCT 51.1 (*)    MCHC 37.4 (*)    Neutro Abs 12.5 (*)    All other components within normal limits  LIPASE, BLOOD  TROPONIN I  PREGNANCY, URINE  CBC WITH DIFFERENTIAL/PLATELET    EKG  EKG Interpretation  Date/Time:  Sunday August 30 2016 17:23:57 EDT Ventricular Rate:  93 PR Interval:    QRS Duration: 99 QT Interval:  407 QTC Calculation: 507 R Axis:   45 Text Interpretation:  Sinus rhythm Borderline ST depression, lateral leads Borderline prolonged QT interval since last tracing no significant change Confirmed by Eber Hong ([id number]) on 08/30/2016 5:28:50 PM       Radiology Dg Abd Acute W/chest  Result Date: 08/30/2016 CLINICAL DATA:  Abdominal pain for several days EXAM: DG ABDOMEN ACUTE W/ 1V CHEST COMPARISON:  05/01/14 FINDINGS: Cardiac shadow is within normal limits. The lungs are well aerated bilaterally. Previously seen somewhat nodular density is not well appreciated in the right upper lobe. No focal infiltrate is seen.  Scattered large and small bowel gas is noted. No obstructive changes are seen. No free air is noted. Postsurgical changes are noted. Calcification is noted in the right hemipelvis consistent with a phlebolith. No acute bony abnormality is seen. Postsurgical changes are noted in the lumbar spine. IMPRESSION: No acute abnormality noted. Electronically Signed   By: Alcide Clever M.D.   On: 08/30/2016 17:23    Procedures Procedures (including critical care time)  Medications Ordered in ED Medications  famotidine (PEPCID) IVPB 20 mg premix (20 mg Intravenous New Bag/Given 08/30/16 2040)  gi cocktail (Maalox,Lidocaine,Donnatal) (30 mLs Oral Given 08/30/16 1719)  potassium chloride 10 mEq in 100 mL IVPB (0 mEq Intravenous Stopped 08/30/16 2037)  potassium chloride SA (K-DUR,KLOR-CON) CR tablet 40 mEq (40 mEq Oral Given 08/30/16 1915)  magnesium sulfate IVPB 2 g 50 mL (0 g Intravenous Stopped 08/30/16 1937)  pantoprazole (PROTONIX) injection 40 mg (40 mg Intravenous Given 08/30/16 2038)  sodium chloride 0.9 % bolus 1,000 mL (1,000 mLs Intravenous New Bag/Given 08/30/16 2038)  iopamidol (ISOVUE-300) 61 % injection 100 mL (100 mLs Intravenous Contrast Given 08/30/16 2051)     Initial Impression / Assessment and Plan / ED Course  I have reviewed the triage vital signs and the nursing notes.  Pertinent labs & imaging results that were available during my care of the patient were reviewed by me and considered in my medical decision making (see chart for details).     At this time the pt has ongoing epigastric discomfort but non surgical abdomen. Needs w/u for the epigastric pain / chest discomfort including labs, xray and ECG. Pt in agreement.  Pt has low K, supplemented with Mag and K CT ordered due to increased WBC - but normal lipase and LFT's Discussed with Dr. Conley Rolls - will admit  Final Clinical Impressions(s) / ED Diagnoses   Final diagnoses:  Hypokalemia  Epigastric pain  Non-intractable vomiting  with nausea, unspecified vomiting type    New Prescriptions New Prescriptions   No medications on file     Eber Hong, MD 08/30/16 2057

## 2016-08-30 NOTE — ED Notes (Signed)
CRITICAL VALUE ALERT  Critical Value:  Potassium 2.6  Date & Time Notied:  08/28/2016 1847  Provider Notified: Dr. Hyacinth Meeker

## 2016-08-30 NOTE — ED Triage Notes (Signed)
Pt comes in with epigastric pain starting Thursday night into Friday morning. States she has had nausea, denies vomiting or diarrhea. Pt has back pain as well that feels like her fibromyalgia but worse.

## 2016-08-30 NOTE — H&P (Addendum)
History and Physical    BRENDALY TOWNSEL WUJ:[id number] DOB: [birth year] DOA: 08/30/2016  PCP: Gareth Morgan, MD  Patient coming from: Home.    Chief Complaint:  Weakness, nausea, and constipation.   HPI: [name] is a 60  with medical history significant of anxiety, chronic back pain, depression, anxiety, panic attacks, heterozygous factor V Leyden mutation, hypertension, chronic laryngitis. Hx of arterial thromboembolic disease s/p embolectomy, presents to the ER with constipation, abdominal pain, and weakness.  Evaluation in the ER showed her K was low at 2.6 mE/L, and normal creatinine.  Her WBC was  15K, with no fever, chills or evidence of infection.   Her UA, CXR and abdominal CT was negative. Previously, she was on HCTZ with hypokalemia, but since, she has been off diuretics.  Her EKG showed QTc prolongationn at only 507 millisecond, and she was given IV Magnesium.   ED Course:  See above.  Rewiew of Systems:  Constitutional: Negative for malaise, fever and chills. No significant weight loss or weight gain Eyes: Negative for eye pain, redness and discharge, diplopia, visual changes, or flashes of light. ENMT: Negative for ear pain, hoarseness, nasal congestion, sinus pressure and sore throat. No headaches; tinnitus, drooling, or problem swallowing. Cardiovascular: Negative for chest pain, palpitations, diaphoresis, dyspnea and peripheral edema. ; No orthopnea, PND Respiratory: Negative for cough, hemoptysis, wheezing and stridor. No pleuritic chestpain. Gastrointestinal: Negative for diarrhea, constipation,  melena, blood in stool, hematemesis, jaundice and rectal bleeding.    Genitourinary: Negative for frequency, dysuria, incontinence,flank pain and hematuria; Musculoskeletal: Negative for back pain and neck pain. Negative for swelling and trauma.;  Skin: . Negative for pruritus, rash, abrasions, bruising and skin lesion.; ulcerations Neuro: Negative for headache,  lightheadedness and neck stiffness. Negative for weakness, altered level of consciousness , altered mental status, extremity weakness, burning feet, involuntary movement, seizure and syncope.  Psych: negative for anxiety, depression, insomnia, tearfulness, panic attacks, hallucinations, paranoia, suicidal or homicidal ideation   Past Medical History:  Diagnosis Date  . Anxiety   . Back pain   . Chronic back pain 02/04/13   By patient report  . Depression   . Factor 5 Leiden mutation, heterozygous (HCC)   . Hypertension   . Laryngitis, chronic   . Panic attack   . Scoliosis   . Ulcer     Past Surgical History:  Procedure Laterality Date  . BACK SURGERY    . CESAREAN SECTION    . CHOLECYSTECTOMY  11/27/2011   Procedure: LAPAROSCOPIC CHOLECYSTECTOMY;  Surgeon: Fabio Bering, MD;  Location: [facility];  Service: General;  Laterality: N/A;  . EMBOLECTOMY Left 12/06/2015   Procedure: EMBOLECTOMY LEFT ARM;  Surgeon: Sherren Kerns, MD;  Location: [facility] OR;  Service: Vascular;  Laterality: Left;  . NECK SURGERY    . TUBAL LIGATION       reports that she has been smoking Cigarettes.  She has a 37.50 pack-year smoking history. She has never used smokeless tobacco. She reports that she does not drink alcohol or use drugs.  Allergies  Allergen Reactions  . Penicillins Anaphylaxis    Has patient had a PCN reaction causing immediate rash, facial/tongue/throat swelling, SOB or lightheadedness with hypotension: No Has patient had a PCN reaction causing severe rash involving mucus membranes or skin necrosis: No Has patient had a PCN reaction that required hospitalization No Has patient had a PCN reaction occurring within the last 10 years: No If all of the above answers are "NO", then  may proceed with Cephalosporin use.   . Ibuprofen Nausea And Vomiting    GI upset, Shakes  . Peppermint Flavor [Flavoring Agent]     Allergic to pepper the spice  . Aspirin Nausea And Vomiting    Gi upset     Family History  Problem Relation Age of Onset  . Heart disease Unknown   . Arthritis Unknown   . Lung disease Unknown   . Cancer Unknown   . Asthma Unknown   . Diabetes Unknown      Prior to Admission medications   Medication Sig Start Date End Date Taking? Authorizing Provider  albuterol (PROVENTIL HFA;VENTOLIN HFA) 108 (90 BASE) MCG/ACT inhaler Inhale 2 puffs into the lungs every 4 (four) hours as needed for wheezing or shortness of breath. 05/03/14  Yes Ward, Layla Maw, DO  ALPRAZolam (XANAX) 1 MG tablet Take 1 tablet (1 mg total) by mouth 4 (four) times daily. 12/07/15  Yes Maris Berger A, PA-C  amphetamine-dextroamphetamine (ADDERALL) 10 MG tablet Take 10 mg by mouth 3 (three) times daily. 8a, 12p, 3p   Yes [provider]  aspirin EC 81 MG EC tablet Take 1 tablet (81 mg total) by mouth daily. 12/08/15  Yes Maris Berger A, PA-C  celecoxib (CELEBREX) 100 MG capsule Take 100 mg by mouth daily.   Yes [provider]  citalopram (CELEXA) 10 MG tablet Take 10 mg by mouth 3 (three) times daily.    Yes [provider]  fluticasone (FLONASE) 50 MCG/ACT nasal spray Place 1 spray into both nostrils at bedtime.   Yes [provider]  lisinopril (PRINIVIL,ZESTRIL) 20 MG tablet Take 1 tablet (20 mg total) by mouth daily. 12/14/15  Yes Meredeth Ide, MD  methocarbamol (ROBAXIN) 750 MG tablet Take 750 mg by mouth 4 (four) times daily.     Yes [provider]  oxyCODONE-acetaminophen (PERCOCET) 7.5-325 MG tablet Take 1 tablet by mouth 3 (three) times daily as needed for severe pain.   Yes [provider]  pantoprazole (PROTONIX) 40 MG tablet Take 40 mg by mouth 2 (two) times daily.    Yes [provider]  pregabalin (LYRICA) 150 MG capsule Take 150 mg by mouth 3 (three) times daily.    Yes [provider]  sucralfate (CARAFATE) 1 g tablet Take 1 tablet (1 g total) by mouth 2 (two) times daily. 12/07/15  Yes Raymond Gurney, PA-C    Physical Exam: Vitals:   08/30/16 1643 08/30/16 1846 08/30/16 2000  BP:  (!) 148/78 (!) 117/106  Pulse:  80 96  Resp:  18 14  SpO2:  96% 95%  Weight: 97.1 kg (214 lb)    Height: 5\' 2"  (1.575 m)        Constitutional: NAD, calm, comfortable Vitals:   08/30/16 1643 08/30/16 1846 08/30/16 2000  BP:  (!) 148/78 (!) 117/106  Pulse:  80 96  Resp:  18 14  SpO2:  96% 95%  Weight: 97.1 kg (214 lb)    Height: 5\' 2"  (1.575 m)     Eyes: PERRL, lids and conjunctivae normal ENMT: Mucous membranes are moist. Posterior pharynx clear of any exudate or lesions.Normal dentition.  Neck: normal, supple, no masses, no thyromegaly Respiratory: clear to auscultation bilaterally, no wheezing, no crackles. Normal respiratory effort. No accessory muscle use.  Cardiovascular: Regular rate and rhythm, no murmurs / rubs / gallops. No extremity edema. 2+ pedal pulses. No carotid bruits.  Abdomen: no tenderness, no masses palpated. No hepatosplenomegaly.  Bowel sounds positive.  Musculoskeletal: no clubbing / cyanosis. No joint deformity upper and lower extremities. Good ROM, no contractures. Normal muscle tone.  Skin: no rashes, lesions, ulcers. No induration Neurologic: CN 2-12 grossly intact. Sensation intact, DTR normal. Strength 5/5 in all 4.  Psychiatric: Normal judgment and insight. Alert and oriented x 3. Normal mood.     Labs on Admission: I have personally reviewed following labs and imaging studies  CBC:  Recent Labs Lab 08/30/16 1808  WBC 15.6*  NEUTROABS 12.5*  HGB 19.1*  HCT 51.1*  MCV 90.0  PLT 346   Basic Metabolic Panel:  Recent Labs Lab 08/30/16 1726  NA 139  K 2.6*  CL 98*  CO2 28  GLUCOSE 151*  BUN 11  CREATININE 0.87  CALCIUM 9.5   GFR: Estimated Creatinine Clearance: 88.8 mL/min (by C-G formula based on SCr of 0.87 mg/dL). Liver Function Tests:  Recent Labs Lab 08/30/16 1726  AST 24  ALT 20  ALKPHOS 132*  BILITOT 1.2  PROT 7.9   ALBUMIN 4.2    Recent Labs Lab 08/30/16 1726  LIPASE 34   Cardiac Enzymes:  Recent Labs Lab 08/30/16 1726  TROPONINI <0.03   Urine analysis:    Component Value Date/Time   COLORURINE YELLOW 08/30/2016 1654   APPEARANCEUR HAZY (A) 08/30/2016 1654   LABSPEC 1.011 08/30/2016 1654   PHURINE 7.0 08/30/2016 1654   GLUCOSEU NEGATIVE 08/30/2016 1654   HGBUR NEGATIVE 08/30/2016 1654   BILIRUBINUR NEGATIVE 08/30/2016 1654   KETONESUR NEGATIVE 08/30/2016 1654   PROTEINUR NEGATIVE 08/30/2016 1654   UROBILINOGEN 0.2 01/10/2014 2312   NITRITE NEGATIVE 08/30/2016 1654   LEUKOCYTESUR NEGATIVE 08/30/2016 1654   Radiological Exams on Admission: Ct Abdomen Pelvis W Contrast  Result Date: 08/30/2016 CLINICAL DATA:  Epigastric pain. EXAM: CT ABDOMEN AND PELVIS WITH CONTRAST TECHNIQUE: Multidetector CT imaging of the abdomen and pelvis was performed using the standard protocol following bolus administration of intravenous contrast. CONTRAST:  ISOVUE-300 IOPAMIDOL (ISOVUE-300) INJECTION 61% COMPARISON:  09/08/2013 FINDINGS: Lower chest: No acute abnormality. Hepatobiliary: Mild hepatic steatosis.  Postcholecystectomy. Pancreas: Unremarkable. No pancreatic ductal dilatation or surrounding inflammatory changes. Spleen: Normal in size without focal abnormality. Adrenals/Urinary Tract: Adrenal glands are unremarkable. Kidneys are normal, without renal calculi, focal lesion, or hydronephrosis. Bladder is unremarkable. Stomach/Bowel: Stomach is within normal limits. Appendix appears normal. No evidence of bowel wall thickening, distention, or inflammatory changes. Vascular/Lymphatic: No significant vascular findings are present. No enlarged abdominal or pelvic lymph nodes. Reproductive: Uterus and bilateral adnexa are unremarkable. Other: No abdominal wall hernia or abnormality. No abdominopelvic ascites. Musculoskeletal: Lower lumbosacral spine laminectomy and fusion with stable appearance. IMPRESSION:  No evidence of acute abnormalities within the abdomen or pelvis. Electronically Signed   By: Ted Mcalpine M.D.   On: 08/30/2016 21:19   Dg Abd Acute W/chest  Result Date: 08/30/2016 CLINICAL DATA:  Abdominal pain for several days EXAM: DG ABDOMEN ACUTE W/ 1V CHEST COMPARISON:  05/01/14 FINDINGS: Cardiac shadow is within normal limits. The lungs are well aerated bilaterally. Previously seen somewhat nodular density is not well appreciated in the right upper lobe. No focal infiltrate is seen. Scattered large and small bowel gas is noted. No obstructive changes are seen. No free air is noted. Postsurgical changes are noted. Calcification is noted in the right hemipelvis consistent with a phlebolith. No acute bony abnormality is seen. Postsurgical changes are noted in the lumbar spine. IMPRESSION: No acute abnormality noted. Electronically Signed   By: Alcide Clever  M.D.   On: 08/30/2016 17:23    EKG: Independently reviewed.   Assessment/Plan Active Problems:   Hypokalemia   PLAN:   Hypokalemia:  Unclear exact etiology.  Will check Mag level.  Replete K and repeat EKG in am.  Albuterol can cause hypoK, but she is not really using it enough to cause it.  Consider nephrology consultation.   QTc prolongation:  Not real severe.  Avoid drugs with QT prolongation.  Replete K and check Mag.  Repeat EKG in the morning.   HTN:  Will continue with ACE I.  Abdominal pain:  Suspect constipation from hypokalemia.  Give laxative and replete K.  She has been on narcotics.  Change to IV Toradol.  Abdominal CT was reassuring.   DVT prophylaxis: subQ Heparin.  Code Status: FULL CODE.  Family Communication: None.  Disposition Plan: home.  Consults called: None. Admission status: OBS>    Jiana Lemaire MD FACP. [facility]  If 7PM-7AM, please contact night-coverage [email or web address] Password [facility]  08/30/2016, 9:51 PM

## 2016-08-31 DIAGNOSIS — E876 Hypokalemia: Secondary | ICD-10-CM

## 2016-08-31 DIAGNOSIS — F418 Other specified anxiety disorders: Secondary | ICD-10-CM | POA: Diagnosis not present

## 2016-08-31 DIAGNOSIS — R531 Weakness: Secondary | ICD-10-CM

## 2016-08-31 DIAGNOSIS — G894 Chronic pain syndrome: Secondary | ICD-10-CM | POA: Diagnosis not present

## 2016-08-31 DIAGNOSIS — I10 Essential (primary) hypertension: Secondary | ICD-10-CM

## 2016-08-31 DIAGNOSIS — R112 Nausea with vomiting, unspecified: Secondary | ICD-10-CM

## 2016-08-31 LAB — CBC
HCT: 47.1 % — ABNORMAL HIGH (ref 36.0–46.0)
Hemoglobin: 16.8 g/dL — ABNORMAL HIGH (ref 12.0–15.0)
MCH: 32.5 pg (ref 26.0–34.0)
MCHC: 35.7 g/dL (ref 30.0–36.0)
MCV: 91.1 fL (ref 78.0–100.0)
PLATELETS: 331 10*3/uL (ref 150–400)
RBC: 5.17 MIL/uL — AB (ref 3.87–5.11)
RDW: 13.4 % (ref 11.5–15.5)
WBC: 14.8 10*3/uL — AB (ref 4.0–10.5)

## 2016-08-31 LAB — BASIC METABOLIC PANEL
ANION GAP: 8 (ref 5–15)
ANION GAP: 6 (ref 5–15)
BUN: 11 mg/dL (ref 6–20)
BUN: 16 mg/dL (ref 6–20)
CHLORIDE: 101 mmol/L (ref 101–111)
CHLORIDE: 107 mmol/L (ref 101–111)
CO2: 30 mmol/L (ref 22–32)
CO2: 26 mmol/L (ref 22–32)
Calcium: 8.8 mg/dL — ABNORMAL LOW (ref 8.9–10.3)
Calcium: 8.3 mg/dL — ABNORMAL LOW (ref 8.9–10.3)
Creatinine, Ser: 0.81 mg/dL (ref 0.44–1.00)
Creatinine, Ser: 1.01 mg/dL — ABNORMAL HIGH (ref 0.44–1.00)
Glucose, Bld: 120 mg/dL — ABNORMAL HIGH (ref 65–99)
Glucose, Bld: 137 mg/dL — ABNORMAL HIGH (ref 65–99)
POTASSIUM: 2.8 mmol/L — AB (ref 3.5–5.1)
POTASSIUM: 3.7 mmol/L (ref 3.5–5.1)
SODIUM: 139 mmol/L (ref 135–145)
SODIUM: 139 mmol/L (ref 135–145)

## 2016-08-31 LAB — RAPID URINE DRUG SCREEN, HOSP PERFORMED
Amphetamines: NOT DETECTED
Barbiturates: NOT DETECTED
Benzodiazepines: POSITIVE — AB
Cocaine: NOT DETECTED
OPIATES: NOT DETECTED
Tetrahydrocannabinol: NOT DETECTED

## 2016-08-31 LAB — HEMOGLOBIN A1C
HEMOGLOBIN A1C: 5.2 % (ref 4.8–5.6)
MEAN PLASMA GLUCOSE: 102.54 mg/dL

## 2016-08-31 LAB — FOLATE: FOLATE: 3.6 ng/mL — AB (ref >5.9)

## 2016-08-31 LAB — TROPONIN I: Troponin I: <0.03 ng/mL (ref <0.03)

## 2016-08-31 LAB — T4, FREE: FREE T4: 0.98 ng/dL (ref 0.61–1.12)

## 2016-08-31 LAB — TSH
TSH: 2.053 u[IU]/mL (ref 0.350–4.500)
TSH: 2.082 u[IU]/mL (ref 0.350–4.500)

## 2016-08-31 LAB — CK: CK TOTAL: 59 U/L (ref 38–234)

## 2016-08-31 LAB — VITAMIN B12: VITAMIN B 12: 211 pg/mL (ref 180–914)

## 2016-08-31 MED ORDER — AMPHETAMINE-DEXTROAMPHETAMINE 10 MG PO TABS
10.0000 mg | ORAL_TABLET | Freq: Two times a day (BID) | ORAL | Status: DC
  Administered 2016-08-31 – 2016-09-01 (×2): 10 mg via ORAL
  Filled 2016-08-31 (×2): qty 1

## 2016-08-31 MED ORDER — POLYETHYLENE GLYCOL 3350 17 G PO PACK
17.0000 g | PACK | Freq: Every day | ORAL | Status: DC
  Administered 2016-08-31 – 2016-09-01 (×2): 17 g via ORAL
  Filled 2016-08-31 (×2): qty 1

## 2016-08-31 MED ORDER — OXYCODONE-ACETAMINOPHEN 7.5-325 MG PO TABS
1.0000 | ORAL_TABLET | Freq: Three times a day (TID) | ORAL | Status: DC | PRN
  Administered 2016-08-31 – 2016-09-01 (×3): 1 via ORAL
  Filled 2016-08-31 (×3): qty 1

## 2016-08-31 MED ORDER — KETOROLAC TROMETHAMINE 30 MG/ML IJ SOLN
30.0000 mg | Freq: Four times a day (QID) | INTRAMUSCULAR | Status: DC | PRN
  Administered 2016-08-31 – 2016-09-01 (×4): 30 mg via INTRAVENOUS
  Filled 2016-08-31 (×4): qty 1

## 2016-08-31 MED ORDER — NICOTINE 21 MG/24HR TD PT24
21.0000 mg | MEDICATED_PATCH | Freq: Every day | TRANSDERMAL | Status: DC
  Administered 2016-08-31 – 2016-09-01 (×2): 21 mg via TRANSDERMAL
  Filled 2016-08-31 (×2): qty 1

## 2016-08-31 MED ORDER — BISACODYL 10 MG RE SUPP
10.0000 mg | Freq: Once | RECTAL | Status: AC
  Administered 2016-08-31: 10 mg via RECTAL
  Filled 2016-08-31: qty 1

## 2016-08-31 MED ORDER — ONDANSETRON HCL 4 MG/2ML IJ SOLN: 4.0000 mg | Freq: Four times a day (QID) | INTRAMUSCULAR | Status: DC | PRN

## 2016-08-31 NOTE — Progress Notes (Signed)
PROGRESS NOTE  [name]:[id number] DOB: 02/21/1970 DOA: 08/30/2016 PCP: Gareth Morgan, MD  Brief History:  46 year old female with a history of anxiety/panic attacks, chronic back pain, factor V Leiden heterozygous mutation, essential hypertension, chronic laryngitis presented with 2 day history of "dry heaves" and generalized weakness. The patient states that she has not had a bowel movement in 1 week. As result, she has had increasing abdominal pain. She has subjective fevers and chills. Apparently she took her temperature at home and it was 98.42F. She denied any headache, neck pain, worsening cough, hemoptysis, diarrhea, hematochezia, melena, dysuria, hematuria, rashes. The patient did have an episode of chest discomfort and shortness of breath on 08/30/2016 which has since resolved. She continues to smoke, but denies any alcohol or illegal drug use. She was found to have a potassium 2.6 with WBC 15.6. As result, the patient was admitted for further observation and workup.  Assessment/Plan: Intractable vomiting/Abdominal pain -pt "dry heaving" since 08/28/16 -seems to be improving since admission -LFTs and lipase normal -UA--neg for pyuria -abdominal pain improving -08/30/16--CT abdomen pelvis--status post cholecystectomy, hepatic steatosis, no bowel obstruction, no hydronephrosis -Continue IV fluids with potassium -Holding Celebrex -Continue PPI  Generalized weakness -suspect underlying viral syndrome-->poor po, N/V, subjective fever/chills -compounded by hypokalemia -check UDS -check B12 -check TSH -UA--no pyuria -personally reviewed CXR--no infiltrates, no edema -CK  Hypokalemia -likely due to GI loss and poor po intake -replete -mag--2.0 -doubt Barter's or Gittleman's  Atypical chest pain -finish cycle troponin -personally reviewed EKG--sinus, nonspecific T-wave changes  Constipation -Start MiraLAX and bisacodyl -Compounded by  hypokalemia -Corrected calcium 9.5 -CT abdomen and pelvis negative for acute findings  Essential hypertension -Continue lisinopril   Anxiety/panic attacks -Continue home dose alprazolam, Lyrica  Chronic pain syndrome -Continue home dose Percocet and Lyrica    Disposition Plan:   Home 8/21 if stable Family Communication:  No Family at bedside--Total time spent 35 minutes.  Greater than 50% spent face to face counseling and coordinating care.   Consultants:  none  Code Status:  FULL / DNR  DVT Prophylaxis:  [place] Lovenox   Procedures: As Listed in Progress Note Above  Antibiotics: None    Subjective: Patient says that her abdominal pain and vomiting seem to be improving. She denies any fevers, chills, chest pain, shortness breath, dysuria, hematuria, diarrhea, hematochezia, melena, headache, neck pain.  Objective: Vitals:   08/30/16 2000 08/30/16 2200 08/30/16 2305 08/31/16 0700  BP: (!) 117/106 110/73 (!) 150/77 (!) 145/70  Pulse: 96 86 84 85  Resp: 14 17 18 15   Temp:   98.5 F (36.9 C) 98 F (36.7 C)  TempSrc:   Oral   SpO2: 95% 98% 96%   Weight:   89.7 kg (197 lb 12 oz)   Height:   5\' 2"  (1.575 m)     Intake/Output Summary (Last 24 hours) at 08/31/16 0753 Last data filed at 08/31/16 0051  Gross per 24 hour  Intake             1203 ml  Output                0 ml  Net             1203 ml   Weight change:  Exam:   General:  Pt is alert, follows commands appropriately, not in acute distress  HEENT: No icterus, No thrush, No neck mass, [place]/AT  Cardiovascular: RRR, S1/S2, no  rubs, no gallops  Respiratory: CTA bilaterally, no wheezing, no crackles, no rhonchi  Abdomen: Soft/+BS, non tender, non distended, no guarding  Extremities: No edema, No lymphangitis, No petechiae, No rashes, no synovitis   Data Reviewed: I have personally reviewed following labs and imaging studies Basic Metabolic Panel:  Recent Labs Lab 08/30/16 1726 08/30/16 1808  08/31/16 0551  NA 139  --  139  K 2.6*  --  2.8*  CL 98*  --  101  CO2 28  --  30  GLUCOSE 151*  --  120*  BUN 11  --  11  CREATININE 0.87  --  0.81  CALCIUM 9.5  --  8.8*  MG  --  2.0  --    Liver Function Tests:  Recent Labs Lab 08/30/16 1726  AST 24  ALT 20  ALKPHOS 132*  BILITOT 1.2  PROT 7.9  ALBUMIN 4.2    Recent Labs Lab 08/30/16 1726  LIPASE 34   No results for input(s): AMMONIA in the last 168 hours. Coagulation Profile: No results for input(s): INR, PROTIME in the last 168 hours. CBC:  Recent Labs Lab 08/30/16 1808 08/31/16 0550  WBC 15.6* 14.8*  NEUTROABS 12.5*  --   HGB 19.1* 16.8*  HCT 51.1* 47.1*  MCV 90.0 91.1  PLT 346 331   Cardiac Enzymes:  Recent Labs Lab 08/30/16 1726  TROPONINI <0.03   BNP: Invalid input(s): POCBNP CBG: No results for input(s): GLUCAP in the last 168 hours. HbA1C: No results for input(s): HGBA1C in the last 72 hours. Urine analysis:    Component Value Date/Time   COLORURINE YELLOW 08/30/2016 1654   APPEARANCEUR HAZY (A) 08/30/2016 1654   LABSPEC 1.011 08/30/2016 1654   PHURINE 7.0 08/30/2016 1654   GLUCOSEU NEGATIVE 08/30/2016 1654   HGBUR NEGATIVE 08/30/2016 1654   BILIRUBINUR NEGATIVE 08/30/2016 1654   KETONESUR NEGATIVE 08/30/2016 1654   PROTEINUR NEGATIVE 08/30/2016 1654   UROBILINOGEN 0.2 01/10/2014 2312   NITRITE NEGATIVE 08/30/2016 1654   LEUKOCYTESUR NEGATIVE 08/30/2016 1654   Sepsis Labs: @LABRCNTIP (procalcitonin:4,lacticidven:4) )No results found for this or any previous visit (from the past 240 hour(s)).   Scheduled Meds: . ALPRAZolam  1 mg Oral QID  . aspirin EC  81 mg Oral Daily  . celecoxib  100 mg Oral Daily  . enoxaparin (LOVENOX) injection  40 mg Subcutaneous Q24H  . fluticasone  1 spray Each Nare QHS  . lisinopril  20 mg Oral Daily  . pantoprazole  40 mg Oral BID  . potassium chloride  40 mEq Oral Daily  . pregabalin  150 mg Oral TID  . sodium chloride flush  3 mL  Intravenous Q12H  . sucralfate  1 g Oral BID   Continuous Infusions: . dextrose 5 % and 0.9 % NaCl with KCl 40 mEq/L 100 mL/hr at 08/31/16 2778    Procedures/Studies: Ct Abdomen Pelvis W Contrast  Result Date: 08/30/2016 CLINICAL DATA:  Epigastric pain. EXAM: CT ABDOMEN AND PELVIS WITH CONTRAST TECHNIQUE: Multidetector CT imaging of the abdomen and pelvis was performed using the standard protocol following bolus administration of intravenous contrast. CONTRAST:  ISOVUE-300 IOPAMIDOL (ISOVUE-300) INJECTION 61% COMPARISON:  09/08/2013 FINDINGS: Lower chest: No acute abnormality. Hepatobiliary: Mild hepatic steatosis.  Postcholecystectomy. Pancreas: Unremarkable. No pancreatic ductal dilatation or surrounding inflammatory changes. Spleen: Normal in size without focal abnormality. Adrenals/Urinary Tract: Adrenal glands are unremarkable. Kidneys are normal, without renal calculi, focal lesion, or hydronephrosis. Bladder is unremarkable. Stomach/Bowel: Stomach is within normal limits. Appendix appears  normal. No evidence of bowel wall thickening, distention, or inflammatory changes. Vascular/Lymphatic: No significant vascular findings are present. No enlarged abdominal or pelvic lymph nodes. Reproductive: Uterus and bilateral adnexa are unremarkable. Other: No abdominal wall hernia or abnormality. No abdominopelvic ascites. Musculoskeletal: Lower lumbosacral spine laminectomy and fusion with stable appearance. IMPRESSION: No evidence of acute abnormalities within the abdomen or pelvis. Electronically Signed   By: Ted Mcalpine M.D.   On: 08/30/2016 21:19   Dg Abd Acute W/chest  Result Date: 08/30/2016 CLINICAL DATA:  Abdominal pain for several days EXAM: DG ABDOMEN ACUTE W/ 1V CHEST COMPARISON:  05/01/14 FINDINGS: Cardiac shadow is within normal limits. The lungs are well aerated bilaterally. Previously seen somewhat nodular density is not well appreciated in the right upper lobe. No focal  infiltrate is seen. Scattered large and small bowel gas is noted. No obstructive changes are seen. No free air is noted. Postsurgical changes are noted. Calcification is noted in the right hemipelvis consistent with a phlebolith. No acute bony abnormality is seen. Postsurgical changes are noted in the lumbar spine. IMPRESSION: No acute abnormality noted. Electronically Signed   By: Alcide Clever M.D.   On: 08/30/2016 17:23    Derell Bruun, DO  [facility] Pager [phone]  If 7PM-7AM, please contact night-coverage [email or web address] Password [email or web address] 08/31/2016, 7:53 AM   LOS: 0 days

## 2016-08-31 NOTE — Care Management Note (Signed)
Case Management Note  Patient Details  Name: [name] MRN: [id number] Date of Birth: 1970-09-17  Subjective/Objective:                  Admitted with hypokalemia. Chart reviewed for CM needs. Pt from home, lives with spouse, ind with ADL's. Has PCP, transportation and insurance with drug coverage. No CM needs noted.   Action/Plan: Will DC home with self care.   Expected Discharge Date:     09/01/2016             Expected Discharge Plan:  Home/Self Care  In-House Referral:  NA  Discharge planning Services  CM Consult  Post Acute Care Choice:  NA Choice offered to:  NA  Status of Service:  Completed, signed off  Malcolm Metro, RN 08/31/2016, 12:16 PM

## 2016-09-01 DIAGNOSIS — E876 Hypokalemia: Secondary | ICD-10-CM | POA: Diagnosis not present

## 2016-09-01 DIAGNOSIS — I10 Essential (primary) hypertension: Secondary | ICD-10-CM | POA: Diagnosis not present

## 2016-09-01 DIAGNOSIS — F418 Other specified anxiety disorders: Secondary | ICD-10-CM | POA: Diagnosis not present

## 2016-09-01 DIAGNOSIS — G894 Chronic pain syndrome: Secondary | ICD-10-CM | POA: Diagnosis not present

## 2016-09-01 LAB — BASIC METABOLIC PANEL
ANION GAP: 6 (ref 5–15)
BUN: 16 mg/dL (ref 6–20)
CO2: 27 mmol/L (ref 22–32)
Calcium: 8.2 mg/dL — ABNORMAL LOW (ref 8.9–10.3)
Chloride: 108 mmol/L (ref 101–111)
Creatinine, Ser: 0.88 mg/dL (ref 0.44–1.00)
GFR calc Af Amer: >60 mL/min (ref >60)
GFR calc non Af Amer: >60 mL/min (ref >60)
GLUCOSE: 98 mg/dL (ref 65–99)
POTASSIUM: 3 mmol/L — AB (ref 3.5–5.1)
Sodium: 141 mmol/L (ref 135–145)

## 2016-09-01 LAB — CBC WITH DIFFERENTIAL/PLATELET
Basophils Absolute: 0 10*3/uL (ref 0.0–0.1)
Basophils Relative: 0 %
EOS PCT: 1 %
Eosinophils Absolute: 0.1 10*3/uL (ref 0.0–0.7)
HCT: 43.4 % (ref 36.0–46.0)
HEMOGLOBIN: 15 g/dL (ref 12.0–15.0)
LYMPHS PCT: 38 %
Lymphs Abs: 3.8 10*3/uL (ref 0.7–4.0)
MCH: 32 pg (ref 26.0–34.0)
MCHC: 34.6 g/dL (ref 30.0–36.0)
MCV: 92.5 fL (ref 78.0–100.0)
MONOS PCT: 8 %
Monocytes Absolute: 0.8 10*3/uL (ref 0.1–1.0)
NEUTROS ABS: 5.1 10*3/uL (ref 1.7–7.7)
NEUTROS PCT: 53 %
PLATELETS: 238 10*3/uL (ref 150–400)
RBC: 4.69 MIL/uL (ref 3.87–5.11)
RDW: 13.8 % (ref 11.5–15.5)
WBC: 9.9 10*3/uL (ref 4.0–10.5)

## 2016-09-01 LAB — MAGNESIUM: Magnesium: 1.7 mg/dL (ref 1.7–2.4)

## 2016-09-01 LAB — HIV ANTIBODY (ROUTINE TESTING W REFLEX): HIV Screen 4th Generation wRfx: NONREACTIVE

## 2016-09-01 MED ORDER — POTASSIUM CHLORIDE CRYS ER 20 MEQ PO TBCR: 20.0000 meq | EXTENDED_RELEASE_TABLET | Freq: Every day | ORAL | 0 refills | Status: DC

## 2016-09-01 MED ORDER — CYANOCOBALAMIN 500 MCG PO TABS: 500.0000 ug | ORAL_TABLET | Freq: Every day | ORAL | 1 refills | Status: DC

## 2016-09-01 MED ORDER — FOLIC ACID 1 MG PO TABS
1.0000 mg | ORAL_TABLET | Freq: Every day | ORAL | Status: DC
  Administered 2016-09-01: 1 mg via ORAL
  Filled 2016-09-01: qty 1

## 2016-09-01 MED ORDER — VITAMIN B-12 100 MCG PO TABS
500.0000 ug | ORAL_TABLET | Freq: Every day | ORAL | Status: DC
  Administered 2016-09-01: 500 ug via ORAL
  Filled 2016-09-01: qty 5

## 2016-09-01 MED ORDER — FOLIC ACID 1 MG PO TABS: 1.0000 mg | ORAL_TABLET | Freq: Every day | ORAL | 1 refills | Status: DC

## 2016-09-01 NOTE — Progress Notes (Signed)
Patient states understanding of discharge instructions, prescriptions given 

## 2016-09-01 NOTE — Discharge Summary (Signed)
Physician Discharge Summary  [name]:[id number] DOB: Nov 07, 1970 DOA: 08/30/2016  PCP: Gareth Morgan, MD  Admit date: 08/30/2016 Discharge date: 09/01/2016  Admitted From: Home Disposition:  Home   Recommendations for Outpatient Follow-up:  1. Follow up with PCP in 1-2 weeks 2. Please obtain BMP/CBC in one week   Discharge Condition: Stable CODE STATUS: FULL Diet recommendation: Heart Healthy    Brief/Interim Summary: 46 year old female with a history of anxiety/panic attacks, chronic back pain, factor V Leiden heterozygous mutation, essential hypertension, chronic laryngitis presented with 2 day history of "dry heaves" and generalized weakness. The patient states that she has not had a bowel movement in 1 week. As result, she has had increasing abdominal pain. She has subjective fevers and chills. Apparently she took her temperature at home and it was 98.64F. She denied any headache, neck pain, worsening cough, hemoptysis, diarrhea, hematochezia, melena, dysuria, hematuria, rashes. The patient did have an episode of chest discomfort and shortness of breath on 08/30/2016 which has since resolved. She continues to smoke, but denies any alcohol or illegal drug use. She was found to have a potassium 2.6 with WBC 15.6. As result, the patient was admitted for further observation and workup.  Discharge Diagnoses:  Intractable vomiting/Abdominal pain -pt "dry heaving" since 08/28/16 -seems to be improving since admission -LFTs and lipase normal -UA--neg for pyuria -abdominal pain improving -08/30/16--CT abdomen pelvis--status post cholecystectomy, hepatic steatosis, no bowel obstruction, no hydronephrosis -Continue IV fluids with potassium -Holding Celebrex -Continue PPI -improved. Tolerating diet at time of discharge  Generalized weakness -suspect underlying viral syndrome-->poor po, N/V, subjective fever/chills -compounded by hypokalemia -check UDS--positive benzo -check  B12--211--replete -folate--3.6-->replete -check TSH--2.082 -UA--no pyuria -personally reviewed CXR--no infiltrates, no edema -CK--59 -improved  Hypokalemia -likely due to GI loss and poor po intake -replete -mag--2.0 -doubt Barter's or Gittleman's -home with KCl x 3 days;  Follow up with PCP for BMP on 8/24  Atypical chest pain -finish cycle troponin -personally reviewed EKG--sinus, nonspecific T-wave changes  Constipation -Start MiraLAX and bisacodyl -Compounded by hypokalemia -Corrected calcium 9.5 -CT abdomen and pelvis negative for acute findings -had 2 BMs prior to discharge  Essential hypertension -Continue lisinopril--controlled  Anxiety/panic attacks -Continue home dose alprazolam, Lyrica  Chronic pain syndrome -Continue home dose Percocet and Lyrica   Discharge Instructions  Discharge Instructions    Diet - low sodium heart healthy    Complete by:  As directed    Increase activity slowly    Complete by:  As directed      Allergies as of 09/01/2016      Reactions   Penicillins Anaphylaxis   Has patient had a PCN reaction causing immediate rash, facial/tongue/throat swelling, SOB or lightheadedness with hypotension: No Has patient had a PCN reaction causing severe rash involving mucus membranes or skin necrosis: No Has patient had a PCN reaction that required hospitalization No Has patient had a PCN reaction occurring within the last 10 years: No If all of the above answers are "NO", then may proceed with Cephalosporin use.   Ibuprofen Nausea And Vomiting   GI upset, Shakes   Peppermint Flavor [flavoring Agent]    Allergic to pepper the spice   Aspirin Nausea And Vomiting   Gi upset      Medication List    TAKE these medications   albuterol 108 (90 Base) MCG/ACT inhaler Commonly known as:  PROVENTIL HFA;VENTOLIN HFA Inhale 2 puffs into the lungs every 4 (four) hours as needed for wheezing or shortness of  breath.   ALPRAZolam 1 MG  tablet Commonly known as:  XANAX Take 1 tablet (1 mg total) by mouth 4 (four) times daily.   amphetamine-dextroamphetamine 10 MG tablet Commonly known as:  ADDERALL Take 10 mg by mouth 3 (three) times daily. 8a, 12p, 3p   aspirin 81 MG EC tablet Take 1 tablet (81 mg total) by mouth daily.   celecoxib 100 MG capsule Commonly known as:  CELEBREX Take 100 mg by mouth daily.   citalopram 10 MG tablet Commonly known as:  CELEXA Take 10 mg by mouth 3 (three) times daily.   cyanocobalamin 500 MCG tablet Take 1 tablet (500 mcg total) by mouth daily.   fluticasone 50 MCG/ACT nasal spray Commonly known as:  FLONASE Place 1 spray into both nostrils at bedtime.   folic acid 1 MG tablet Commonly known as:  FOLVITE Take 1 tablet (1 mg total) by mouth daily.   lisinopril 20 MG tablet Commonly known as:  PRINIVIL,ZESTRIL Take 1 tablet (20 mg total) by mouth daily.   methocarbamol 750 MG tablet Commonly known as:  ROBAXIN Take 750 mg by mouth 4 (four) times daily.   oxyCODONE-acetaminophen 7.5-325 MG tablet Commonly known as:  PERCOCET Take 1 tablet by mouth 3 (three) times daily as needed for severe pain.   pantoprazole 40 MG tablet Commonly known as:  PROTONIX Take 40 mg by mouth 2 (two) times daily.   potassium chloride SA 20 MEQ tablet Commonly known as:  K-DUR,KLOR-CON Take 1 tablet (20 mEq total) by mouth daily.   pregabalin 150 MG capsule Commonly known as:  LYRICA Take 150 mg by mouth 3 (three) times daily.   sucralfate 1 g tablet Commonly known as:  CARAFATE Take 1 tablet (1 g total) by mouth 2 (two) times daily.       Allergies  Allergen Reactions  . Penicillins Anaphylaxis    Has patient had a PCN reaction causing immediate rash, facial/tongue/throat swelling, SOB or lightheadedness with hypotension: No Has patient had a PCN reaction causing severe rash involving mucus membranes or skin necrosis: No Has patient had a PCN reaction that required  hospitalization No Has patient had a PCN reaction occurring within the last 10 years: No If all of the above answers are "NO", then may proceed with Cephalosporin use.   . Ibuprofen Nausea And Vomiting    GI upset, Shakes  . Peppermint Flavor [Flavoring Agent]     Allergic to pepper the spice  . Aspirin Nausea And Vomiting    Gi upset    Consultations:  none   Procedures/Studies: Ct Abdomen Pelvis W Contrast  Result Date: 08/30/2016 CLINICAL DATA:  Epigastric pain. EXAM: CT ABDOMEN AND PELVIS WITH CONTRAST TECHNIQUE: Multidetector CT imaging of the abdomen and pelvis was performed using the standard protocol following bolus administration of intravenous contrast. CONTRAST:  ISOVUE-300 IOPAMIDOL (ISOVUE-300) INJECTION 61% COMPARISON:  09/08/2013 FINDINGS: Lower chest: No acute abnormality. Hepatobiliary: Mild hepatic steatosis.  Postcholecystectomy. Pancreas: Unremarkable. No pancreatic ductal dilatation or surrounding inflammatory changes. Spleen: Normal in size without focal abnormality. Adrenals/Urinary Tract: Adrenal glands are unremarkable. Kidneys are normal, without renal calculi, focal lesion, or hydronephrosis. Bladder is unremarkable. Stomach/Bowel: Stomach is within normal limits. Appendix appears normal. No evidence of bowel wall thickening, distention, or inflammatory changes. Vascular/Lymphatic: No significant vascular findings are present. No enlarged abdominal or pelvic lymph nodes. Reproductive: Uterus and bilateral adnexa are unremarkable. Other: No abdominal wall hernia or abnormality. No abdominopelvic ascites. Musculoskeletal: Lower lumbosacral spine laminectomy and  fusion with stable appearance. IMPRESSION: No evidence of acute abnormalities within the abdomen or pelvis. Electronically Signed   By: Ted Mcalpine M.D.   On: 08/30/2016 21:19   Dg Abd Acute W/chest  Result Date: 08/30/2016 CLINICAL DATA:  Abdominal pain for several days EXAM: DG ABDOMEN ACUTE W/  1V CHEST COMPARISON:  05/01/14 FINDINGS: Cardiac shadow is within normal limits. The lungs are well aerated bilaterally. Previously seen somewhat nodular density is not well appreciated in the right upper lobe. No focal infiltrate is seen. Scattered large and small bowel gas is noted. No obstructive changes are seen. No free air is noted. Postsurgical changes are noted. Calcification is noted in the right hemipelvis consistent with a phlebolith. No acute bony abnormality is seen. Postsurgical changes are noted in the lumbar spine. IMPRESSION: No acute abnormality noted. Electronically Signed   By: Alcide Clever M.D.   On: 08/30/2016 17:23         Discharge Exam: Vitals:   08/31/16 2230 09/01/16 0422  BP: 103/84 (!) 123/96  Pulse: 78 80  Resp: 16 16  Temp: 98.3 F (36.8 C) 98.2 F (36.8 C)  SpO2: 98% 97%   Vitals:   08/31/16 0700 08/31/16 1508 08/31/16 2230 09/01/16 0422  BP: (!) 145/70 (!) 141/74 103/84 (!) 123/96  Pulse: 85 94 78 80  Resp: 15 16 16 16   Temp: 98 F (36.7 C) 98.4 F (36.9 C) 98.3 F (36.8 C) 98.2 F (36.8 C)  TempSrc:  Oral Oral Oral  SpO2:  98% 98% 97%  Weight:      Height:        General: Pt is alert, awake, not in acute distress Cardiovascular: RRR, S1/S2 +, no rubs, no gallops Respiratory: CTA bilaterally, no wheezing, no rhonchi Abdominal: Soft, NT, ND, bowel sounds + Extremities: no edema, no cyanosis   The results of significant diagnostics from this hospitalization (including imaging, microbiology, ancillary and laboratory) are listed below for reference.    Significant Diagnostic Studies: Ct Abdomen Pelvis W Contrast  Result Date: 08/30/2016 CLINICAL DATA:  Epigastric pain. EXAM: CT ABDOMEN AND PELVIS WITH CONTRAST TECHNIQUE: Multidetector CT imaging of the abdomen and pelvis was performed using the standard protocol following bolus administration of intravenous contrast. CONTRAST:  ISOVUE-300 IOPAMIDOL (ISOVUE-300) INJECTION 61% COMPARISON:   09/08/2013 FINDINGS: Lower chest: No acute abnormality. Hepatobiliary: Mild hepatic steatosis.  Postcholecystectomy. Pancreas: Unremarkable. No pancreatic ductal dilatation or surrounding inflammatory changes. Spleen: Normal in size without focal abnormality. Adrenals/Urinary Tract: Adrenal glands are unremarkable. Kidneys are normal, without renal calculi, focal lesion, or hydronephrosis. Bladder is unremarkable. Stomach/Bowel: Stomach is within normal limits. Appendix appears normal. No evidence of bowel wall thickening, distention, or inflammatory changes. Vascular/Lymphatic: No significant vascular findings are present. No enlarged abdominal or pelvic lymph nodes. Reproductive: Uterus and bilateral adnexa are unremarkable. Other: No abdominal wall hernia or abnormality. No abdominopelvic ascites. Musculoskeletal: Lower lumbosacral spine laminectomy and fusion with stable appearance. IMPRESSION: No evidence of acute abnormalities within the abdomen or pelvis. Electronically Signed   By: Ted Mcalpine M.D.   On: 08/30/2016 21:19   Dg Abd Acute W/chest  Result Date: 08/30/2016 CLINICAL DATA:  Abdominal pain for several days EXAM: DG ABDOMEN ACUTE W/ 1V CHEST COMPARISON:  05/01/14 FINDINGS: Cardiac shadow is within normal limits. The lungs are well aerated bilaterally. Previously seen somewhat nodular density is not well appreciated in the right upper lobe. No focal infiltrate is seen. Scattered large and small bowel gas is noted. No obstructive changes  are seen. No free air is noted. Postsurgical changes are noted. Calcification is noted in the right hemipelvis consistent with a phlebolith. No acute bony abnormality is seen. Postsurgical changes are noted in the lumbar spine. IMPRESSION: No acute abnormality noted. Electronically Signed   By: Alcide Clever M.D.   On: 08/30/2016 17:23     Microbiology: No results found for this or any previous visit (from the past 240 hour(s)).   Labs: Basic  Metabolic Panel:  Recent Labs Lab 08/30/16 1726 08/30/16 1808 08/31/16 0551 08/31/16 1958 09/01/16 0406  NA 139  --  139 139 141  K 2.6*  --  2.8* 3.7 3.0*  CL 98*  --  101 107 108  CO2 28  --  30 26 27   GLUCOSE 151*  --  120* 137* 98  BUN 11  --  11 16 16   CREATININE 0.87  --  0.81 1.01* 0.88  CALCIUM 9.5  --  8.8* 8.3* 8.2*  MG  --  2.0  --   --  1.7   Liver Function Tests:  Recent Labs Lab 08/30/16 1726  AST 24  ALT 20  ALKPHOS 132*  BILITOT 1.2  PROT 7.9  ALBUMIN 4.2    Recent Labs Lab 08/30/16 1726  LIPASE 34   No results for input(s): AMMONIA in the last 168 hours. CBC:  Recent Labs Lab 08/30/16 1808 08/31/16 0550 09/01/16 0406  WBC 15.6* 14.8* 9.9  NEUTROABS 12.5*  --  5.1  HGB 19.1* 16.8* 15.0  HCT 51.1* 47.1* 43.4  MCV 90.0 91.1 92.5  PLT 346 331 238   Cardiac Enzymes:  Recent Labs Lab 08/30/16 1726 08/31/16 0850 08/31/16 1417  CKTOTAL  --  59  --   TROPONINI <0.03 <0.03 <0.03   BNP: Invalid input(s): POCBNP CBG: No results for input(s): GLUCAP in the last 168 hours.  Time coordinating discharge:  Greater than 30 minutes  Signed:  Daneil Beem, DO [facility] Pager: [phone] 09/01/2016, 10:14 AM

## 2016-11-24 ENCOUNTER — Encounter: Payer: Self-pay | Admitting: *Deleted

## 2016-12-08 ENCOUNTER — Ambulatory Visit: Payer: Self-pay | Admitting: Cardiovascular Disease

## 2016-12-08 ENCOUNTER — Encounter: Payer: Self-pay | Admitting: *Deleted

## 2016-12-29 ENCOUNTER — Encounter: Payer: Self-pay | Admitting: *Deleted

## 2016-12-30 ENCOUNTER — Ambulatory Visit: Payer: Self-pay | Admitting: Cardiovascular Disease

## 2017-09-15 ENCOUNTER — Ambulatory Visit (HOSPITAL_COMMUNITY)
Admission: RE | Admit: 2017-09-15 | Discharge: 2017-09-15 | Disposition: A | Discharge status: Home / Self Care | Payer: Medicaid Other | Source: Ambulatory Visit | Attending: Family Medicine | Admitting: Family Medicine

## 2017-09-15 ENCOUNTER — Other Ambulatory Visit (HOSPITAL_COMMUNITY): Payer: Self-pay | Admitting: Family Medicine

## 2017-09-15 DIAGNOSIS — M549 Dorsalgia, unspecified: Secondary | ICD-10-CM

## 2017-09-15 DIAGNOSIS — Z981 Arthrodesis status: Secondary | ICD-10-CM | POA: Diagnosis not present

## 2017-09-15 DIAGNOSIS — M542 Cervicalgia: Secondary | ICD-10-CM | POA: Insufficient documentation

## 2017-09-15 DIAGNOSIS — R918 Other nonspecific abnormal finding of lung field: Secondary | ICD-10-CM | POA: Diagnosis not present

## 2017-09-15 DIAGNOSIS — M545 Low back pain: Secondary | ICD-10-CM | POA: Insufficient documentation

## 2017-09-15 DIAGNOSIS — M546 Pain in thoracic spine: Secondary | ICD-10-CM | POA: Diagnosis present

## 2018-09-11 IMAGING — CT CT ABD-PELV W/ CM
2 of 5 series · 17 of 46 positions shown, 19 images · IV contrast (iopamidol)
Comparison: 09/08/2013

CLINICAL DATA: Epigastric pain.

EXAM:
CT ABDOMEN AND PELVIS WITH CONTRAST
TECHNIQUE: Multidetector CT imaging of the abdomen and pelvis was performed
using the standard protocol following bolus administration of
intravenous contrast.
CONTRAST:  100mL 1109JU-0AA IOPAMIDOL ([id number]) INJECTION 61%

[Series 2: axial st · axial · 0.87mm/px · z∈[+998,+1383]mm · 14 of 89 slices shown, 16 images]
[im 6/89  soft-tissue]
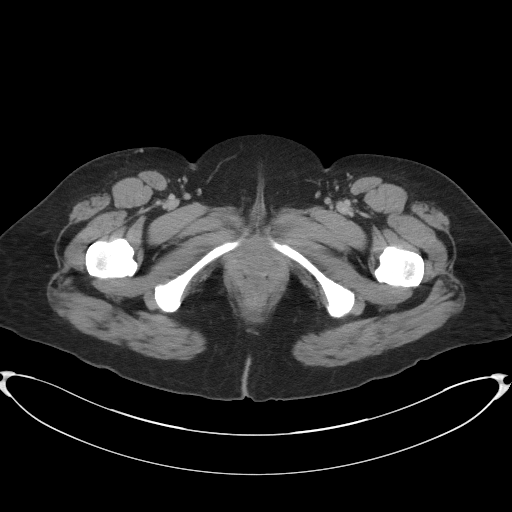
[im 6/89  bone]
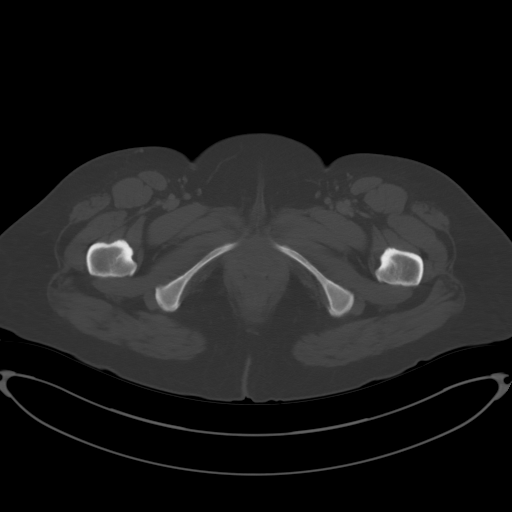
[im 11/89  soft-tissue]
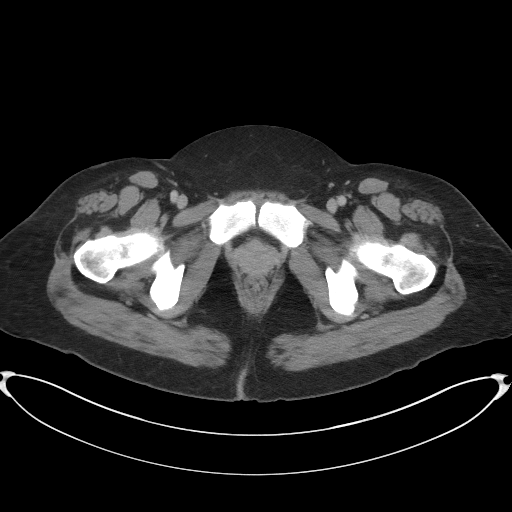
[im 16/89  soft-tissue]
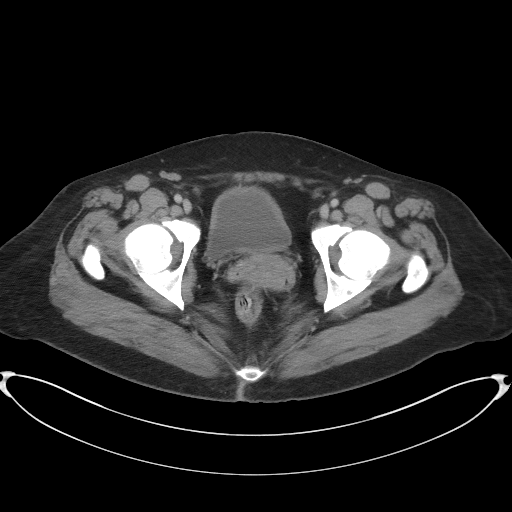
[im 26/89  soft-tissue]
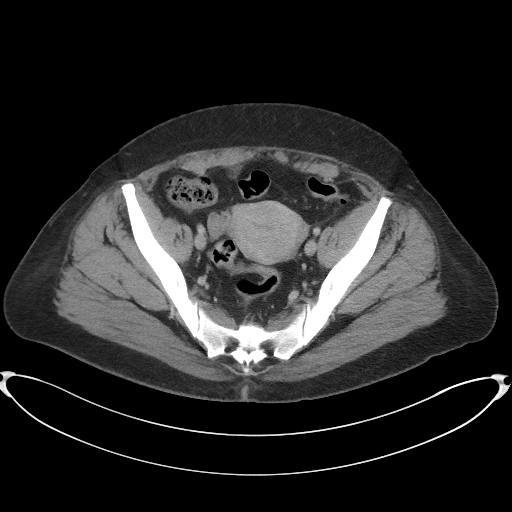
[im 32/89  soft-tissue]
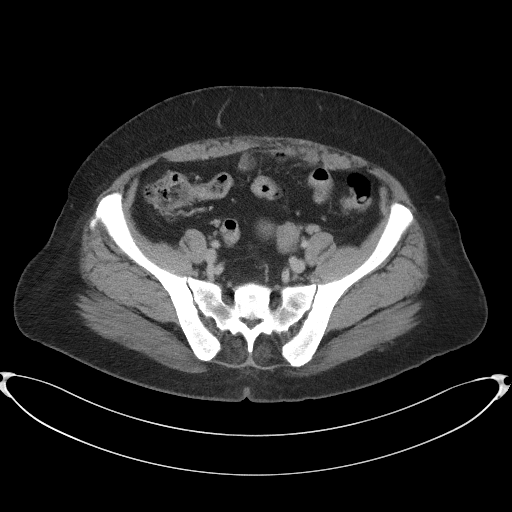
[im 37/89  soft-tissue]
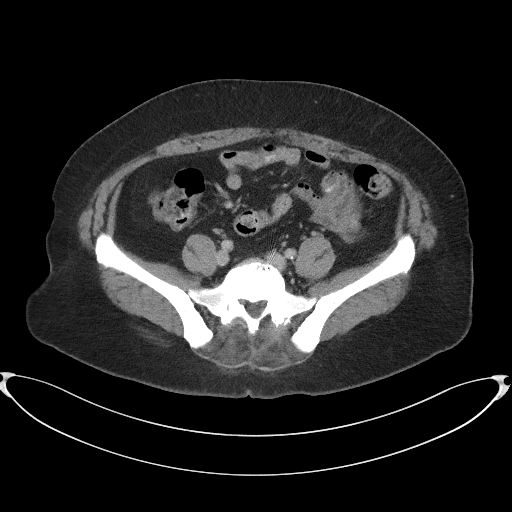
[im 42/89  soft-tissue]
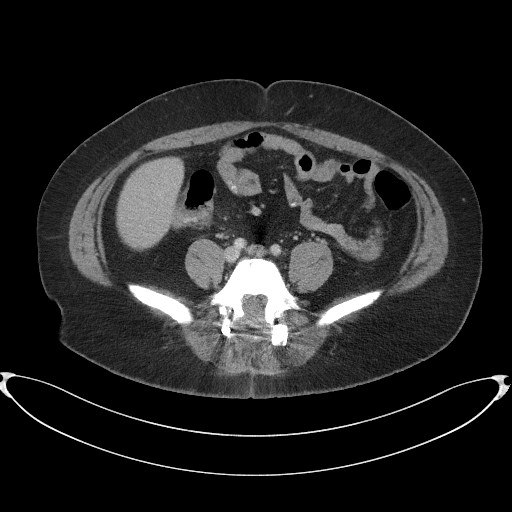
[im 47/89  soft-tissue]
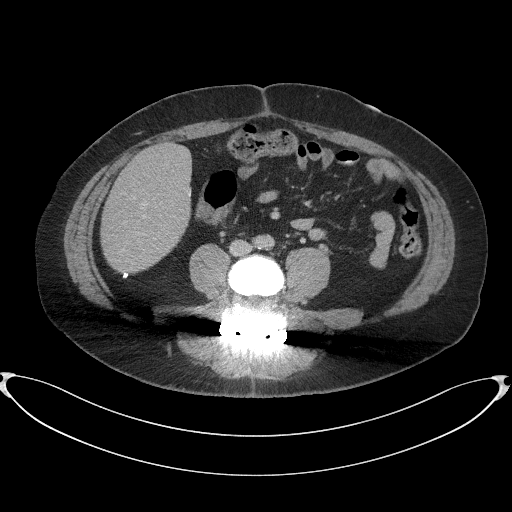
[im 52/89  soft-tissue]
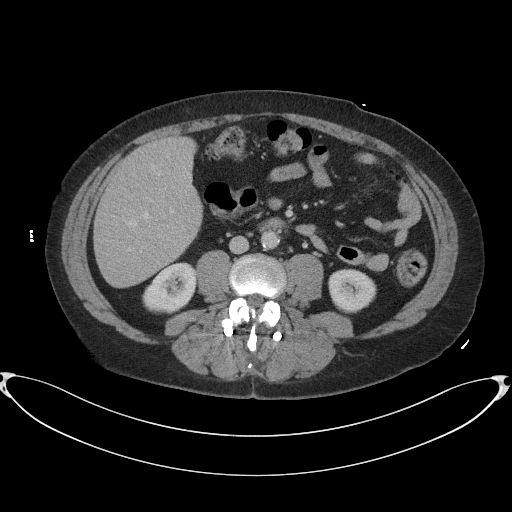
[im 52/89  bone]
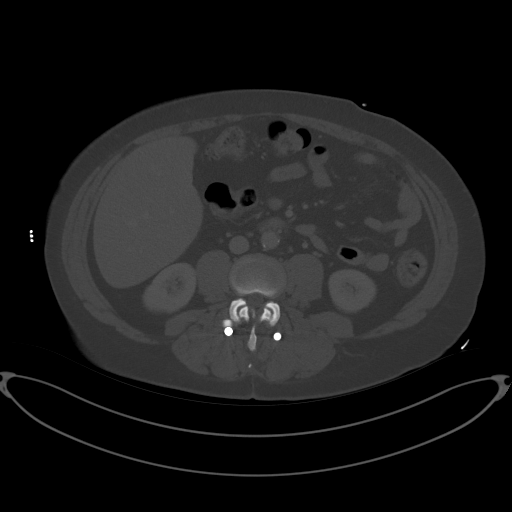
[im 57/89  soft-tissue]
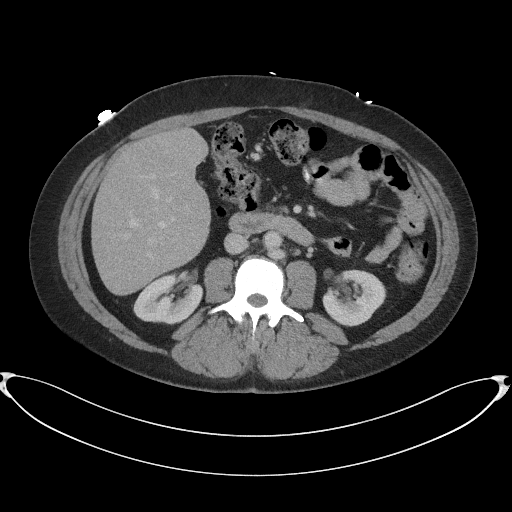
[im 68/89  soft-tissue]
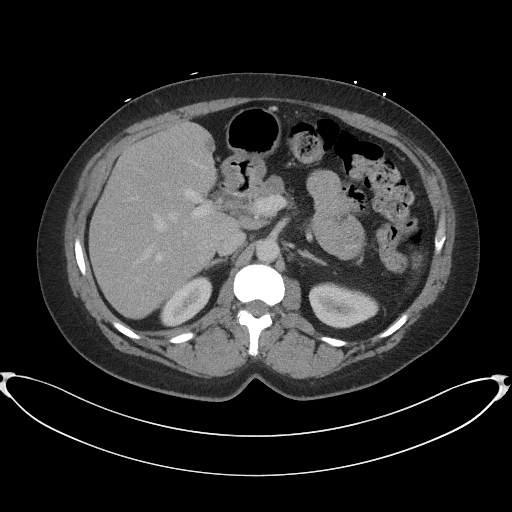
[im 73/89  soft-tissue]
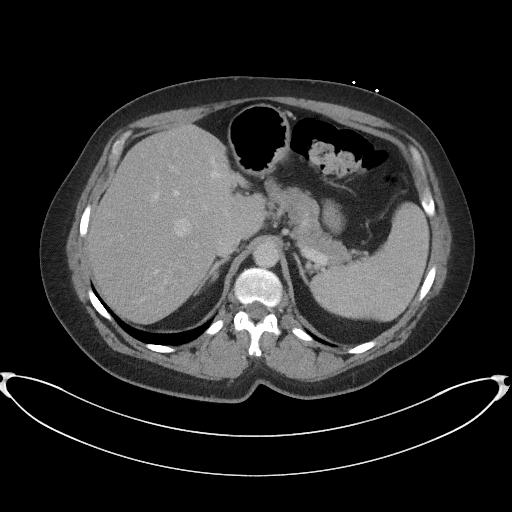
[im 78/89  soft-tissue]
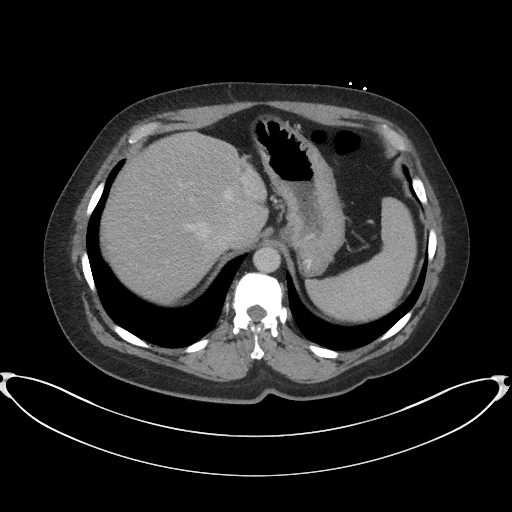
[im 83/89  soft-tissue]
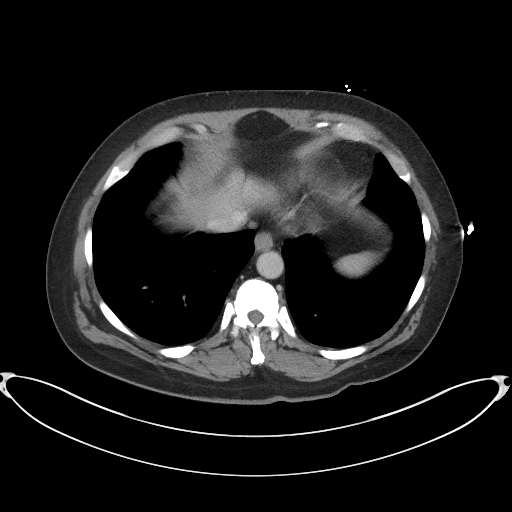

[Series 5: coronal st · coronal · 0.77mm/px · 3 of 94 slices shown]
[im 32/94  soft-tissue]
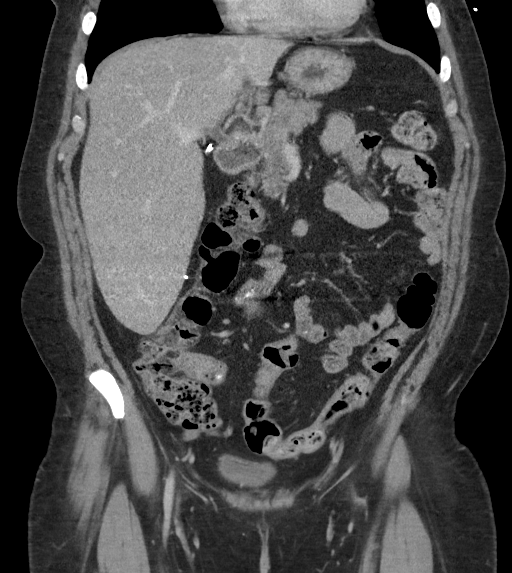
[im 42/94  soft-tissue]
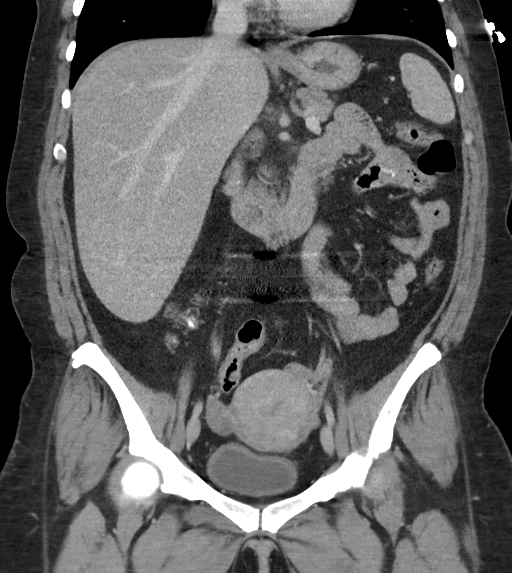
[im 52/94  soft-tissue]
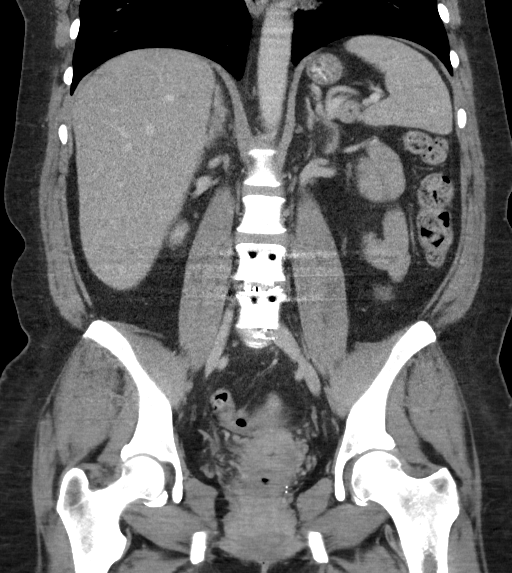

[17 of 46 positions shown; findings below may reference images not displayed]

FINDINGS: Lower chest: No acute abnormality.

Hepatobiliary: Mild hepatic steatosis.  Postcholecystectomy.

Pancreas: Unremarkable. No pancreatic ductal dilatation or
surrounding inflammatory changes.

Spleen: Normal in size without focal abnormality.

Adrenals/Urinary Tract: Adrenal glands are unremarkable. Kidneys are
normal, without renal calculi, focal lesion, or hydronephrosis.
Bladder is unremarkable.

Stomach/Bowel: Stomach is within normal limits. Appendix appears
normal. No evidence of bowel wall thickening, distention, or
inflammatory changes.

Vascular/Lymphatic: No significant vascular findings are present. No
enlarged abdominal or pelvic lymph nodes.

Reproductive: Uterus and bilateral adnexa are unremarkable.

Other: No abdominal wall hernia or abnormality. No abdominopelvic
ascites.

Musculoskeletal: Lower lumbosacral spine laminectomy and fusion with
stable appearance.
IMPRESSION: No evidence of acute abnormalities within the abdomen or pelvis.

## 2020-02-17 ENCOUNTER — Other Ambulatory Visit: Payer: Self-pay

## 2020-02-17 ENCOUNTER — Emergency Department (HOSPITAL_COMMUNITY): Payer: Medicaid Other

## 2020-02-17 ENCOUNTER — Encounter (HOSPITAL_COMMUNITY): Payer: Self-pay | Admitting: *Deleted

## 2020-02-17 ENCOUNTER — Emergency Department (HOSPITAL_COMMUNITY)
Admission: EM | Admit: 2020-02-17 | Discharge: 2020-02-17 | Disposition: A | Discharge status: Home / Self Care | Payer: Medicaid Other | Attending: Emergency Medicine | Admitting: Emergency Medicine

## 2020-02-17 DIAGNOSIS — Z79899 Other long term (current) drug therapy: Secondary | ICD-10-CM | POA: Insufficient documentation

## 2020-02-17 DIAGNOSIS — Z7982 Long term (current) use of aspirin: Secondary | ICD-10-CM | POA: Insufficient documentation

## 2020-02-17 DIAGNOSIS — I10 Essential (primary) hypertension: Secondary | ICD-10-CM | POA: Diagnosis not present

## 2020-02-17 DIAGNOSIS — F17210 Nicotine dependence, cigarettes, uncomplicated: Secondary | ICD-10-CM | POA: Diagnosis not present

## 2020-02-17 DIAGNOSIS — R1013 Epigastric pain: Secondary | ICD-10-CM | POA: Diagnosis not present

## 2020-02-17 DIAGNOSIS — K5900 Constipation, unspecified: Secondary | ICD-10-CM | POA: Insufficient documentation

## 2020-02-17 LAB — CBC WITH DIFFERENTIAL/PLATELET
Abs Immature Granulocytes: 0.03 10*3/uL (ref 0.00–0.07)
Basophils Absolute: 0.1 10*3/uL (ref 0.0–0.1)
Basophils Relative: 1 %
Eosinophils Absolute: 0 10*3/uL (ref 0.0–0.5)
Eosinophils Relative: 0 %
HCT: 51.8 % — ABNORMAL HIGH (ref 36.0–46.0)
Hemoglobin: 18.1 g/dL — ABNORMAL HIGH (ref 12.0–15.0)
Immature Granulocytes: 0 %
Lymphocytes Relative: 18 %
Lymphs Abs: 2 10*3/uL (ref 0.7–4.0)
MCH: 34.2 pg — ABNORMAL HIGH (ref 26.0–34.0)
MCHC: 34.9 g/dL (ref 30.0–36.0)
MCV: 97.9 fL (ref 80.0–100.0)
Monocytes Absolute: 0.5 10*3/uL (ref 0.1–1.0)
Monocytes Relative: 4 %
Neutro Abs: 8.5 10*3/uL — ABNORMAL HIGH (ref 1.7–7.7)
Neutrophils Relative %: 77 %
Platelets: 211 10*3/uL (ref 150–400)
RBC: 5.29 MIL/uL — ABNORMAL HIGH (ref 3.87–5.11)
RDW: 12.4 % (ref 11.5–15.5)
WBC: 11.1 10*3/uL — ABNORMAL HIGH (ref 4.0–10.5)
nRBC: 0 % (ref 0.0–0.2)

## 2020-02-17 LAB — URINALYSIS, ROUTINE W REFLEX MICROSCOPIC
Bilirubin Urine: NEGATIVE
Glucose, UA: NEGATIVE mg/dL
Hgb urine dipstick: NEGATIVE
Ketones, ur: NEGATIVE mg/dL
Leukocytes,Ua: NEGATIVE
Nitrite: NEGATIVE
Protein, ur: NEGATIVE mg/dL
Specific Gravity, Urine: 1.011 (ref 1.005–1.030)
pH: 7 (ref 5.0–8.0)

## 2020-02-17 LAB — COMPREHENSIVE METABOLIC PANEL
ALT: 39 U/L (ref 0–44)
AST: 38 U/L (ref 15–41)
Albumin: 4.6 g/dL (ref 3.5–5.0)
Alkaline Phosphatase: 107 U/L (ref 38–126)
Anion gap: 10 (ref 5–15)
BUN: 12 mg/dL (ref 6–20)
CO2: 29 mmol/L (ref 22–32)
Calcium: 10.5 mg/dL — ABNORMAL HIGH (ref 8.9–10.3)
Chloride: 98 mmol/L (ref 98–111)
Creatinine, Ser: 1.04 mg/dL — ABNORMAL HIGH (ref 0.44–1.00)
GFR, Estimated: >60 mL/min (ref >60)
Glucose, Bld: 146 mg/dL — ABNORMAL HIGH (ref 70–99)
Potassium: 4.1 mmol/L (ref 3.5–5.1)
Sodium: 137 mmol/L (ref 135–145)
Total Bilirubin: 0.9 mg/dL (ref 0.3–1.2)
Total Protein: 8.5 g/dL — ABNORMAL HIGH (ref 6.5–8.1)

## 2020-02-17 LAB — LIPASE, BLOOD: Lipase: 31 U/L (ref 11–51)

## 2020-02-17 LAB — HCG, SERUM, QUALITATIVE: Preg, Serum: NEGATIVE

## 2020-02-17 MED ORDER — IOHEXOL 300 MG/ML  SOLN
100.0000 mL | Freq: Once | INTRAMUSCULAR | Status: AC | PRN
  Administered 2020-02-17: 100 mL via INTRAVENOUS

## 2020-02-17 MED ORDER — ONDANSETRON 4 MG PO TBDP: 4.0000 mg | ORAL_TABLET | Freq: Three times a day (TID) | ORAL | 0 refills | Status: DC | PRN

## 2020-02-17 MED ORDER — SUCRALFATE 1 G PO TABS: 1.0000 g | ORAL_TABLET | Freq: Two times a day (BID) | ORAL | 0 refills | Status: DC

## 2020-02-17 MED ORDER — PANTOPRAZOLE SODIUM 40 MG IV SOLR
40.0000 mg | Freq: Once | INTRAVENOUS | Status: AC
  Administered 2020-02-17: 40 mg via INTRAVENOUS
  Filled 2020-02-17: qty 40

## 2020-02-17 MED ORDER — MORPHINE SULFATE (PF) 4 MG/ML IV SOLN
4.0000 mg | Freq: Once | INTRAVENOUS | Status: AC
  Administered 2020-02-17: 4 mg via INTRAVENOUS
  Filled 2020-02-17: qty 1

## 2020-02-17 MED ORDER — ONDANSETRON HCL 4 MG/2ML IJ SOLN
4.0000 mg | Freq: Once | INTRAMUSCULAR | Status: AC
  Administered 2020-02-17: 4 mg via INTRAVENOUS
  Filled 2020-02-17: qty 2

## 2020-02-17 NOTE — ED Triage Notes (Signed)
Abdominal pain, history of gastritis, out of medication

## 2020-02-17 NOTE — Discharge Instructions (Signed)
As discussed, all of your labs are reassuring today.  Your CT scan was negative for any acute abnormalities.  It did show hepatic steatosis.  I have included the number of the GI doctor.  Please call Monday to schedule point for further evaluation.  I have refilled your Carafate.  I am also sending you home with nausea medication.  Take as needed.  You may also take over-the-counter MiraLAX as needed for constipation.  Please follow-up with PCP if symptoms not improved within the next week.  Return to the ER for new or worsening symptoms.

## 2020-02-17 NOTE — ED Provider Notes (Signed)
[name] EMERGENCY DEPARTMENT Provider Note   CSN: [id number] Arrival date & time: 02/17/20  1631     History Chief Complaint  Patient presents with  . Abdominal Pain    [name] is a 50 y.o. female with a past medical history significant for anxiety, chronic back pain, depression, factor V Leyden mutation, hypertension who presents to the ED due to epigastric pain x1 week.  Patient states pain is worse when lying flat.  History of same which she believes was caused by "gastritis".  Patient believes her pain is present because she ran out of Carafate within the past week.  Denies associated vomiting and diarrhea.  Admits to nausea.  Last bowel movement was roughly 1 week ago which is atypical for patient.  Denies alcohol and chronic NSAIDs.  Denies melena, hematochezia, and hematemesis.  No previous history of pancreatitis.  She has had a cholecystectomy, C-sections, and a tubal ligation.  Denies fever and chills.  Denies urinary vaginal symptoms.  Denies chest pain and shortness of breath.  History obtained from patient and past medical records. No interpreter used during encounter.      Past Medical History:  Diagnosis Date  . Anxiety   . Back pain   . Chronic back pain 02/04/13   By patient report  . Depression   . Factor 5 Leiden mutation, heterozygous (HCC)   . Hypertension   . Laryngitis, chronic   . Panic attack   . Scoliosis   . Ulcer     Patient Active Problem List   Diagnosis Date Noted  . Intractable vomiting with nausea 08/31/2016  . Generalized weakness 08/31/2016  . Chronic pain syndrome 08/31/2016  . Hypokalemia 12/11/2015  . Hyponatremia 12/11/2015  . History of embolectomy of left arm.  12/11/2015  . Depression with anxiety 12/11/2015  . Essential hypertension 12/11/2015  . Chronic back pain 12/11/2015  . Ischemia of hand 12/06/2015  . 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 Knee pain  05/31/2012  . Bilateral leg weakness 11/20/2010    Past Surgical History:  Procedure Laterality Date  . BACK SURGERY    . CESAREAN SECTION    . CHOLECYSTECTOMY  11/27/2011   Procedure: LAPAROSCOPIC CHOLECYSTECTOMY;  Surgeon: Fabio Bering, MD;  Location: [facility];  Service: General;  Laterality: N/A;  . EMBOLECTOMY Left 12/06/2015   Procedure: EMBOLECTOMY LEFT ARM;  Surgeon: Sherren Kerns, MD;  Location: [facility] OR;  Service: Vascular;  Laterality: Left;  . NECK SURGERY    . TUBAL LIGATION       OB History    Gravida  4   Para  3   Term  3   Preterm      AB  1   Living  3     SAB      IAB      Ectopic      Multiple      Live Births              Family History  Problem Relation Age of Onset  . Heart disease Other   . Arthritis Other   . Lung disease Other   . Cancer Other   . Asthma Other   . Diabetes Other     Social History   Tobacco Use  . Smoking status: Current Every Day Smoker    Packs/day: 1.50    Years: 25.00    Pack years: 37.50  Types: Cigarettes  . Smokeless tobacco: Never Used  Substance Use Topics  . Alcohol use: No  . Drug use: No    Home Medications Prior to Admission medications   Medication Sig Start Date End Date Taking? Authorizing Provider  albuterol (PROVENTIL HFA;VENTOLIN HFA) 108 (90 BASE) MCG/ACT inhaler Inhale 2 puffs into the lungs every 4 (four) hours as needed for wheezing or shortness of breath. 05/03/14  Yes Ward, Layla Maw, DO  ALPRAZolam (XANAX) 1 MG tablet Take 1 tablet (1 mg total) by mouth 4 (four) times daily. 12/07/15  Yes Raymond Gurney, PA-C  amitriptyline (ELAVIL) 25 MG tablet Take 25 mg by mouth at bedtime. 02/08/20  Yes [provider]  aspirin EC 81 MG EC tablet Take 1 tablet (81 mg total) by mouth daily. 12/08/15  Yes Maris Berger A, PA-C  aspirin-sod bicarb-citric acid (ALKA-SELTZER) 325 MG TBEF tablet Take 325 mg by mouth every 6 (six) hours as needed.   Yes [provider]   atorvastatin (LIPITOR) 20 MG tablet Take 20 mg by mouth daily.   Yes [provider]  bismuth subsalicylate (PEPTO BISMOL) 262 MG chewable tablet Chew 524 mg by mouth as needed.   Yes [provider]  celecoxib (CELEBREX) 200 MG capsule Take 200 mg by mouth daily. 12/26/19  Yes [provider]  citalopram (CELEXA) 10 MG tablet Take 10 mg by mouth daily.   Yes [provider]  fluticasone (FLONASE) 50 MCG/ACT nasal spray Place 1 spray into both nostrils at bedtime.   Yes [provider]  hydrochlorothiazide (HYDRODIURIL) 12.5 MG tablet Take 12.5 mg by mouth daily. 12/18/19  Yes [provider]  lisinopril (PRINIVIL,ZESTRIL) 20 MG tablet Take 1 tablet (20 mg total) by mouth daily. 12/14/15  Yes Meredeth Ide, MD  methocarbamol (ROBAXIN) 750 MG tablet Take 750 mg by mouth 4 (four) times daily.   Yes [provider]  ondansetron (ZOFRAN ODT) 4 MG disintegrating tablet Take 1 tablet (4 mg total) by mouth every 8 (eight) hours as needed for nausea or vomiting. 02/17/20  Yes Aberman, Caroline C, PA-C  potassium chloride SA (K-DUR,KLOR-CON) 20 MEQ tablet Take 1 tablet (20 mEq total) by mouth daily. 09/02/16 09/05/16 Yes Tat, Onalee Hua, MD  pregabalin (LYRICA) 200 MG capsule Take 200 mg by mouth in the morning, at noon, and at bedtime. 01/04/20  Yes [provider]  sucralfate (CARAFATE) 1 g tablet Take 1 tablet (1 g total) by mouth 2 (two) times daily. 12/07/15  Yes Maris Berger A, PA-C  sucralfate (CARAFATE) 1 g tablet Take 1 tablet (1 g total) by mouth 2 (two) times daily. 02/17/20  Yes Aberman, Caroline C, PA-C  TRINTELLIX 10 MG TABS tablet Take 10 mg by mouth daily. 01/01/20  Yes [provider]  Vitamin D, Ergocalciferol, (DRISDOL) 1.25 MG (50000 UNIT) CAPS capsule Take 50,000 Units by mouth 2 (two) times a week. 02/16/20  Yes [provider]  VYVANSE 50 MG capsule Take 50 mg by mouth every morning. 01/22/20  Yes [provider]  zolpidem (AMBIEN) 10 MG tablet Take 10 mg by mouth at bedtime. 01/22/20  Yes [provider]  amphetamine-dextroamphetamine (ADDERALL) 10 MG tablet Take 10 mg by mouth 3 (three) times daily. 8a, 12p, 3p Patient not taking: Reported on 02/17/2020    [provider]  folic acid (FOLVITE) 1 MG tablet Take 1 tablet (1 mg total) by mouth daily. Patient not taking: No sig reported 09/01/16   Catarina Hartshorn,  MD  oxyCODONE-acetaminophen (PERCOCET) 7.5-325 MG tablet Take 1 tablet by mouth 3 (three) times daily as needed for severe pain. Patient not taking: Reported on 02/17/2020    [provider]  pantoprazole (PROTONIX) 40 MG tablet Take 40 mg by mouth 2 (two) times daily.  Patient not taking: Reported on 02/17/2020    [provider]  vitamin B-12 500 MCG tablet Take 1 tablet (500 mcg total) by mouth daily. Patient not taking: Reported on 02/17/2020 09/01/16   Catarina Hartshornat, David, MD    Allergies    Penicillins, Ibuprofen, Peppermint flavor [flavoring agent], and Aspirin  Review of Systems   Review of Systems  Constitutional: Negative for chills and fever.  Respiratory: Negative for shortness of breath.   Cardiovascular: Negative for chest pain.  Gastrointestinal: Positive for abdominal pain and constipation. Negative for blood in stool, diarrhea, nausea and vomiting.  Genitourinary: Negative for dysuria and vaginal discharge.  All other systems reviewed and are negative.   Physical Exam Updated Vital Signs BP (!) 113/91   Pulse 77   Temp 98.2 F (36.8 C)   Resp 20   SpO2 97%   Physical Exam Vitals and nursing note reviewed.  Constitutional:      General: She is not in acute distress.    Appearance: She is not toxic-appearing.  HENT:     Head: Normocephalic.  Eyes:     Pupils: Pupils are equal, round, and reactive to light.  Cardiovascular:     Rate and Rhythm: Normal rate and regular rhythm.     Pulses: Normal pulses.     Heart sounds: Normal  heart sounds. No murmur heard. No friction rub. No gallop.   Pulmonary:     Effort: Pulmonary effort is normal.     Breath sounds: Normal breath sounds.  Abdominal:     General: Abdomen is flat. Bowel sounds are normal. There is no distension.     Palpations: Abdomen is soft.     Tenderness: There is abdominal tenderness. There is guarding. There is no rebound.     Comments: Diffuse abdominal tenderness most significant in epigastric and right upper quadrant.  Negative Murphy sign.  Abdomen soft and nondistended.  Musculoskeletal:        General: Normal range of motion.     Cervical back: Neck supple.  Skin:    General: Skin is warm and dry.  Neurological:     General: No focal deficit present.     Mental Status: She is alert.  Psychiatric:        Mood and Affect: Mood normal.        Behavior: Behavior normal.     ED Results / Procedures / Treatments   Labs (all labs ordered are listed, but only abnormal results are displayed) Labs Reviewed  CBC WITH DIFFERENTIAL/PLATELET - Abnormal; Notable for the following components:      Result Value   WBC 11.1 (*)    RBC 5.29 (*)    Hemoglobin 18.1 (*)    HCT 51.8 (*)    MCH 34.2 (*)    Neutro Abs 8.5 (*)    All other components within normal limits  COMPREHENSIVE METABOLIC PANEL - Abnormal; Notable for the following components:   Glucose, Bld 146 (*)    Creatinine, Ser 1.04 (*)    Calcium 10.5 (*)    Total Protein 8.5 (*)    All other components within normal limits  LIPASE, BLOOD  URINALYSIS, ROUTINE W REFLEX MICROSCOPIC  HCG, SERUM,  QUALITATIVE    EKG EKG Interpretation  Date/Time:  Saturday February 17 2020 17:35:03 EST Ventricular Rate:  84 PR Interval:    QRS Duration: 102 QT Interval:  361 QTC Calculation: 427 R Axis:   44 Text Interpretation: Sinus rhythm Atrial premature complexes RSR' in V1 or V2, right VCD or RVH Borderline T abnormalities, anterior leads Confirmed by Norman Clay ([id number]) on 02/17/2020 6:59:15  PM   Radiology CT ABDOMEN PELVIS W CONTRAST  Result Date: 02/17/2020 CLINICAL DATA:  Epigastric pain history of gastritis. EXAM: CT ABDOMEN AND PELVIS WITH CONTRAST TECHNIQUE: Multidetector CT imaging of the abdomen and pelvis was performed using the standard protocol following bolus administration of intravenous contrast. CONTRAST:  OMNIPAQUE IOHEXOL 300 MG/ML  SOLN COMPARISON:  CT abdomen pelvis August 30, 2016. FINDINGS: Lower chest: No acute abnormality. Normal size heart. No pericardial effusion. Hepatobiliary: Diffuse hepatic steatosis. No suspicious hepatic lesion. Prior cholecystectomy. Calcification along the posterior aspect of the liver (series 2, image 40), unchanged and possibly representing a dropped gallstone. Similar reservoir effect of the biliary tree. Pancreas: Unremarkable. No pancreatic ductal dilatation or surrounding inflammatory changes. Spleen: Unremarkable Adrenals/Urinary Tract: Bilateral adrenal glands are unremarkable. No suspicious renal lesions. No hydronephrosis. No filling defects in the collecting system on delayed imaging. There is mild diffuse bladder wall thickening however the bladder is decompressed. Stomach/Bowel: Unremarkable decompressed appearance of the stomach. Duodenal diverticulum. No evidence suspicious small bowel wall thickening or dilation. Normal appendix. Colon is unremarkable without suspicious wall thickening or visible diverticula. Radiopaque material visualized throughout colon. Vascular/Lymphatic: Aortic atherosclerosis. No enlarged abdominal or pelvic lymph nodes. Reproductive: Uterus and bilateral adnexa are unremarkable. Other: No abdominopelvic ascites. Musculoskeletal: Prior lower lumbosacral laminectomy and fusion stable in appearance no acute osseous abnormality. IMPRESSION: 1. No acute abnormality in the abdomen or pelvis. 2. Similar apparent mild diffuse bladder wall thickening however the bladder is decompressed. Correlate with urinalysis  to exclude cystitis. 3. Diffuse hepatic steatosis which can be a cause of abdominal pain. 4. Aortic atherosclerosis. Aortic Atherosclerosis (ICD10-I70.0). Electronically Signed   By: Maudry Mayhew MD   On: 02/17/2020 18:37    Procedures Procedures   Medications Ordered in ED Medications  ondansetron [facility]) injection 4 mg (4 mg Intravenous Given 02/17/20 1727)  morphine 4 MG/ML injection 4 mg (4 mg Intravenous Given 02/17/20 1728)  pantoprazole (PROTONIX) injection 40 mg (40 mg Intravenous Given 02/17/20 1726)  iohexol (OMNIPAQUE) 300 MG/ML solution 100 mL (100 mLs Intravenous Contrast Given 02/17/20 1819)    ED Course  I have reviewed the triage vital signs and the nursing notes.  Pertinent labs & imaging results that were available during my care of the patient were reviewed by me and considered in my medical decision making (see chart for details).    MDM Rules/Calculators/A&P                         50 year old female presents to the ED due to epigastric pain x1 week associated with nausea and constipation.  History of cholecystectomy, C-sections, and tubal ligation.  Upon arrival, stable vitals.  Patient in no acute distress and nontoxic-appearing.  Physical exam significant for diffuse abdominal tenderness most significant in epigastric and right upper quadrant with voluntary guarding.  Routine labs ordered to rule out infectious etiology and electrolyte abnormalities.  Lipase to rule out pancreatitis.  CT abdomen given numerous abdominal operations and constipation rule out obstruction.  IV Protonix, morphine, and Zofran given for symptomatic relief.  EKG to rule out atypical chest pain.  CBC significant for mild leukocytosis at 11.1 and elevated hemoglobin at 18.1 likely due to hemoconcentration.  CMP significant for hyperglycemia 146 with no anion gap.  Doubt DKA.  Mild elevation in creatinine at 1.04.  Normal BUN.  Lipase normal at 31.  Doubt pancreatitis.  EKG personally reviewed which  demonstrates normal sinus rhythm with no signs of acute ischemia.  6:17 PM reassessed patient at bedside.  Patient admits to moderate improvement in symptoms.  Abdomen soft, nondistended with improved epigastric and right upper quadrant tenderness.  CT abdomen personally reviewed which demonstrates: IMPRESSION:  1. No acute abnormality in the abdomen or pelvis.  2. Similar apparent mild diffuse bladder wall thickening however the  bladder is decompressed. Correlate with urinalysis to exclude  cystitis.  3. Diffuse hepatic steatosis which can be a cause of abdominal pain.  4. Aortic atherosclerosis.   UA unremarkable.  No signs of infection.  7:04 PM reassessed patient at bedside.  Abdomen soft, nondistended, nontender.  No tenderness in epigastric or right upper quadrant.  Patient discharged with refill of Carafate and Zofran.  Instructed patient take over-the-counter MiraLAX as needed for constipation.  GI number given to patient at discharge.  Instructed patient to call on Monday to schedule point for further evaluation. Strict ED precautions discussed with patient. Patient states understanding and agrees to plan. Patient discharged home in no acute distress and stable vitals  Final Clinical Impression(s) / ED Diagnoses Final diagnoses:  Epigastric pain  Constipation, unspecified constipation type    Rx / DC Orders ED Discharge Orders         Ordered    sucralfate (CARAFATE) 1 g tablet  2 times daily        02/17/20 1858    ondansetron (ZOFRAN ODT) 4 MG disintegrating tablet  Every 8 hours PRN        02/17/20 1905           Jesusita Oka 02/17/20 1908    Cheryll Cockayne, MD 02/18/20 [phone]

## 2020-03-06 ENCOUNTER — Other Ambulatory Visit (HOSPITAL_COMMUNITY): Payer: Self-pay | Admitting: Family Medicine

## 2020-03-06 DIAGNOSIS — R748 Abnormal levels of other serum enzymes: Secondary | ICD-10-CM

## 2020-03-15 ENCOUNTER — Encounter (HOSPITAL_COMMUNITY): Payer: Self-pay

## 2020-03-15 ENCOUNTER — Ambulatory Visit (HOSPITAL_COMMUNITY): Payer: Medicaid Other

## 2020-03-27 ENCOUNTER — Ambulatory Visit (HOSPITAL_COMMUNITY): Payer: Medicaid Other

## 2021-01-28 DIAGNOSIS — I10 Essential (primary) hypertension: Secondary | ICD-10-CM | POA: Diagnosis not present

## 2021-01-28 DIAGNOSIS — J449 Chronic obstructive pulmonary disease, unspecified: Secondary | ICD-10-CM | POA: Diagnosis not present

## 2021-01-28 DIAGNOSIS — E782 Mixed hyperlipidemia: Secondary | ICD-10-CM | POA: Diagnosis not present

## 2021-01-28 DIAGNOSIS — M797 Fibromyalgia: Secondary | ICD-10-CM | POA: Diagnosis not present

## 2021-02-14 ENCOUNTER — Inpatient Hospital Stay (HOSPITAL_COMMUNITY)
Admission: EM | Admit: 2021-02-14 | Discharge: 2021-02-21 | DRG: 871 | Discharge status: Against Medical Advice | Payer: Medicaid Other | Attending: Internal Medicine | Admitting: Internal Medicine

## 2021-02-14 ENCOUNTER — Inpatient Hospital Stay (HOSPITAL_COMMUNITY): Payer: Medicaid Other

## 2021-02-14 ENCOUNTER — Emergency Department (HOSPITAL_COMMUNITY): Payer: Medicaid Other

## 2021-02-14 ENCOUNTER — Inpatient Hospital Stay: Payer: Self-pay

## 2021-02-14 ENCOUNTER — Other Ambulatory Visit: Payer: Self-pay

## 2021-02-14 ENCOUNTER — Encounter (HOSPITAL_COMMUNITY): Payer: Self-pay

## 2021-02-14 DIAGNOSIS — Z7982 Long term (current) use of aspirin: Secondary | ICD-10-CM

## 2021-02-14 DIAGNOSIS — Z91018 Allergy to other foods: Secondary | ICD-10-CM

## 2021-02-14 DIAGNOSIS — Z6841 Body Mass Index (BMI) 40.0 and over, adult: Secondary | ICD-10-CM | POA: Diagnosis not present

## 2021-02-14 DIAGNOSIS — J90 Pleural effusion, not elsewhere classified: Secondary | ICD-10-CM | POA: Diagnosis not present

## 2021-02-14 DIAGNOSIS — G928 Other toxic encephalopathy: Secondary | ICD-10-CM | POA: Diagnosis present

## 2021-02-14 DIAGNOSIS — F329 Major depressive disorder, single episode, unspecified: Secondary | ICD-10-CM | POA: Diagnosis not present

## 2021-02-14 DIAGNOSIS — I4891 Unspecified atrial fibrillation: Secondary | ICD-10-CM | POA: Diagnosis present

## 2021-02-14 DIAGNOSIS — N170 Acute kidney failure with tubular necrosis: Secondary | ICD-10-CM | POA: Diagnosis present

## 2021-02-14 DIAGNOSIS — E874 Mixed disorder of acid-base balance: Secondary | ICD-10-CM | POA: Diagnosis present

## 2021-02-14 DIAGNOSIS — Z8719 Personal history of other diseases of the digestive system: Secondary | ICD-10-CM | POA: Diagnosis not present

## 2021-02-14 DIAGNOSIS — E872 Acidosis, unspecified: Secondary | ICD-10-CM | POA: Diagnosis present

## 2021-02-14 DIAGNOSIS — E785 Hyperlipidemia, unspecified: Secondary | ICD-10-CM | POA: Diagnosis not present

## 2021-02-14 DIAGNOSIS — M419 Scoliosis, unspecified: Secondary | ICD-10-CM | POA: Diagnosis not present

## 2021-02-14 DIAGNOSIS — K219 Gastro-esophageal reflux disease without esophagitis: Secondary | ICD-10-CM | POA: Diagnosis not present

## 2021-02-14 DIAGNOSIS — J9692 Respiratory failure, unspecified with hypercapnia: Secondary | ICD-10-CM | POA: Diagnosis present

## 2021-02-14 DIAGNOSIS — D6851 Activated protein C resistance: Secondary | ICD-10-CM | POA: Diagnosis not present

## 2021-02-14 DIAGNOSIS — R404 Transient alteration of awareness: Secondary | ICD-10-CM | POA: Diagnosis not present

## 2021-02-14 DIAGNOSIS — J45909 Unspecified asthma, uncomplicated: Secondary | ICD-10-CM | POA: Diagnosis present

## 2021-02-14 DIAGNOSIS — R6521 Severe sepsis with septic shock: Secondary | ICD-10-CM | POA: Diagnosis present

## 2021-02-14 DIAGNOSIS — K5900 Constipation, unspecified: Secondary | ICD-10-CM | POA: Diagnosis present

## 2021-02-14 DIAGNOSIS — A419 Sepsis, unspecified organism: Secondary | ICD-10-CM | POA: Diagnosis not present

## 2021-02-14 DIAGNOSIS — K831 Obstruction of bile duct: Secondary | ICD-10-CM | POA: Diagnosis present

## 2021-02-14 DIAGNOSIS — Z791 Long term (current) use of non-steroidal anti-inflammatories (NSAID): Secondary | ICD-10-CM

## 2021-02-14 DIAGNOSIS — E871 Hypo-osmolality and hyponatremia: Secondary | ICD-10-CM | POA: Diagnosis not present

## 2021-02-14 DIAGNOSIS — Z880 Allergy status to penicillin: Secondary | ICD-10-CM

## 2021-02-14 DIAGNOSIS — Z978 Presence of other specified devices: Secondary | ICD-10-CM

## 2021-02-14 DIAGNOSIS — Z4659 Encounter for fitting and adjustment of other gastrointestinal appliance and device: Secondary | ICD-10-CM

## 2021-02-14 DIAGNOSIS — Z20822 Contact with and (suspected) exposure to covid-19: Secondary | ICD-10-CM | POA: Diagnosis not present

## 2021-02-14 DIAGNOSIS — E878 Other disorders of electrolyte and fluid balance, not elsewhere classified: Secondary | ICD-10-CM | POA: Diagnosis present

## 2021-02-14 DIAGNOSIS — J9602 Acute respiratory failure with hypercapnia: Secondary | ICD-10-CM

## 2021-02-14 DIAGNOSIS — R7989 Other specified abnormal findings of blood chemistry: Secondary | ICD-10-CM | POA: Diagnosis not present

## 2021-02-14 DIAGNOSIS — N179 Acute kidney failure, unspecified: Secondary | ICD-10-CM

## 2021-02-14 DIAGNOSIS — E781 Pure hyperglyceridemia: Secondary | ICD-10-CM | POA: Diagnosis present

## 2021-02-14 DIAGNOSIS — J9691 Respiratory failure, unspecified with hypoxia: Secondary | ICD-10-CM | POA: Diagnosis present

## 2021-02-14 DIAGNOSIS — Z781 Physical restraint status: Secondary | ICD-10-CM

## 2021-02-14 DIAGNOSIS — F410 Panic disorder [episodic paroxysmal anxiety] without agoraphobia: Secondary | ICD-10-CM | POA: Diagnosis present

## 2021-02-14 DIAGNOSIS — R531 Weakness: Secondary | ICD-10-CM

## 2021-02-14 DIAGNOSIS — F418 Other specified anxiety disorders: Secondary | ICD-10-CM | POA: Diagnosis present

## 2021-02-14 DIAGNOSIS — G894 Chronic pain syndrome: Secondary | ICD-10-CM | POA: Diagnosis not present

## 2021-02-14 DIAGNOSIS — R6520 Severe sepsis without septic shock: Secondary | ICD-10-CM | POA: Diagnosis present

## 2021-02-14 DIAGNOSIS — G4733 Obstructive sleep apnea (adult) (pediatric): Secondary | ICD-10-CM | POA: Diagnosis present

## 2021-02-14 DIAGNOSIS — J9601 Acute respiratory failure with hypoxia: Secondary | ICD-10-CM | POA: Diagnosis not present

## 2021-02-14 DIAGNOSIS — R0902 Hypoxemia: Secondary | ICD-10-CM | POA: Diagnosis not present

## 2021-02-14 DIAGNOSIS — R61 Generalized hyperhidrosis: Secondary | ICD-10-CM | POA: Diagnosis not present

## 2021-02-14 DIAGNOSIS — K7200 Acute and subacute hepatic failure without coma: Secondary | ICD-10-CM | POA: Diagnosis present

## 2021-02-14 DIAGNOSIS — D6959 Other secondary thrombocytopenia: Secondary | ICD-10-CM | POA: Diagnosis not present

## 2021-02-14 DIAGNOSIS — J690 Pneumonitis due to inhalation of food and vomit: Secondary | ICD-10-CM | POA: Diagnosis present

## 2021-02-14 DIAGNOSIS — J9690 Respiratory failure, unspecified, unspecified whether with hypoxia or hypercapnia: Secondary | ICD-10-CM | POA: Diagnosis not present

## 2021-02-14 DIAGNOSIS — Z886 Allergy status to analgesic agent status: Secondary | ICD-10-CM

## 2021-02-14 DIAGNOSIS — I959 Hypotension, unspecified: Secondary | ICD-10-CM | POA: Diagnosis not present

## 2021-02-14 DIAGNOSIS — F17210 Nicotine dependence, cigarettes, uncomplicated: Secondary | ICD-10-CM | POA: Diagnosis present

## 2021-02-14 DIAGNOSIS — I10 Essential (primary) hypertension: Secondary | ICD-10-CM | POA: Diagnosis not present

## 2021-02-14 DIAGNOSIS — Z8249 Family history of ischemic heart disease and other diseases of the circulatory system: Secondary | ICD-10-CM

## 2021-02-14 DIAGNOSIS — Z79899 Other long term (current) drug therapy: Secondary | ICD-10-CM

## 2021-02-14 LAB — CBC
HCT: 42.4 % (ref 36.0–46.0)
Hemoglobin: 13.9 g/dL (ref 12.0–15.0)
MCH: 33.2 pg (ref 26.0–34.0)
MCHC: 32.8 g/dL (ref 30.0–36.0)
MCV: 101.2 fL — ABNORMAL HIGH (ref 80.0–100.0)
Platelets: 132 10*3/uL — ABNORMAL LOW (ref 150–400)
RBC: 4.19 MIL/uL (ref 3.87–5.11)
RDW: 13.5 % (ref 11.5–15.5)
WBC: 12 10*3/uL — ABNORMAL HIGH (ref 4.0–10.5)
nRBC: 0.2 % (ref 0.0–0.2)

## 2021-02-14 LAB — RAPID URINE DRUG SCREEN, HOSP PERFORMED
Amphetamines: POSITIVE — AB
Barbiturates: NOT DETECTED
Benzodiazepines: POSITIVE — AB
Cocaine: NOT DETECTED
Opiates: NOT DETECTED
Tetrahydrocannabinol: NOT DETECTED

## 2021-02-14 LAB — URINALYSIS, MICROSCOPIC (REFLEX): RBC / HPF: NONE SEEN RBC/hpf (ref 0–5)

## 2021-02-14 LAB — URINALYSIS, ROUTINE W REFLEX MICROSCOPIC
Bilirubin Urine: NEGATIVE
Glucose, UA: NEGATIVE mg/dL
Ketones, ur: NEGATIVE mg/dL
Leukocytes,Ua: NEGATIVE
Nitrite: NEGATIVE
Specific Gravity, Urine: >1.03 — ABNORMAL HIGH (ref 1.005–1.030)
pH: 5 (ref 5.0–8.0)

## 2021-02-14 LAB — BLOOD GAS, VENOUS
Acid-Base Excess: 2.7 mmol/L — ABNORMAL HIGH (ref 0.0–2.0)
Bicarbonate: 24.3 mmol/L (ref 20.0–28.0)
Drawn by: 6509
FIO2: 100
O2 Saturation: 91.6 %
Patient temperature: 37
pCO2, Ven: 72.7 mmHg (ref 44.0–60.0)
pH, Ven: 7.234 — ABNORMAL LOW (ref 7.250–7.430)
pO2, Ven: 74.5 mmHg — ABNORMAL HIGH (ref 32.0–45.0)

## 2021-02-14 LAB — COMPREHENSIVE METABOLIC PANEL
ALT: 63 U/L — ABNORMAL HIGH (ref 0–44)
ALT: 65 U/L — ABNORMAL HIGH (ref 0–44)
AST: 90 U/L — ABNORMAL HIGH (ref 15–41)
AST: 119 U/L — ABNORMAL HIGH (ref 15–41)
Albumin: 3 g/dL — ABNORMAL LOW (ref 3.5–5.0)
Albumin: 2.4 g/dL — ABNORMAL LOW (ref 3.5–5.0)
Alkaline Phosphatase: 164 U/L — ABNORMAL HIGH (ref 38–126)
Alkaline Phosphatase: 137 U/L — ABNORMAL HIGH (ref 38–126)
Anion gap: 12 (ref 5–15)
Anion gap: 11 (ref 5–15)
BUN: 59 mg/dL — ABNORMAL HIGH (ref 6–20)
BUN: 58 mg/dL — ABNORMAL HIGH (ref 6–20)
CO2: 28 mmol/L (ref 22–32)
CO2: 25 mmol/L (ref 22–32)
Calcium: 7.6 mg/dL — ABNORMAL LOW (ref 8.9–10.3)
Calcium: 7.6 mg/dL — ABNORMAL LOW (ref 8.9–10.3)
Chloride: 94 mmol/L — ABNORMAL LOW (ref 98–111)
Chloride: 102 mmol/L (ref 98–111)
Creatinine, Ser: 2.68 mg/dL — ABNORMAL HIGH (ref 0.44–1.00)
Creatinine, Ser: 2.19 mg/dL — ABNORMAL HIGH (ref 0.44–1.00)
GFR, Estimated: 21 mL/min — ABNORMAL LOW (ref >60)
GFR, Estimated: 27 mL/min — ABNORMAL LOW (ref >60)
Glucose, Bld: 137 mg/dL — ABNORMAL HIGH (ref 70–99)
Glucose, Bld: 112 mg/dL — ABNORMAL HIGH (ref 70–99)
Potassium: 5.5 mmol/L — ABNORMAL HIGH (ref 3.5–5.1)
Potassium: 5.3 mmol/L — ABNORMAL HIGH (ref 3.5–5.1)
Sodium: 134 mmol/L — ABNORMAL LOW (ref 135–145)
Sodium: 138 mmol/L (ref 135–145)
Total Bilirubin: 1.3 mg/dL — ABNORMAL HIGH (ref 0.3–1.2)
Total Bilirubin: 1.1 mg/dL (ref 0.3–1.2)
Total Protein: 6.9 g/dL (ref 6.5–8.1)
Total Protein: 5.8 g/dL — ABNORMAL LOW (ref 6.5–8.1)

## 2021-02-14 LAB — GLUCOSE, CAPILLARY
Glucose-Capillary: 106 mg/dL — ABNORMAL HIGH (ref 70–99)
Glucose-Capillary: 136 mg/dL — ABNORMAL HIGH (ref 70–99)
Glucose-Capillary: 159 mg/dL — ABNORMAL HIGH (ref 70–99)

## 2021-02-14 LAB — CBC WITH DIFFERENTIAL/PLATELET
Band Neutrophils: 25 %
Basophils Absolute: 0 10*3/uL (ref 0.0–0.1)
Basophils Relative: 0 %
Eosinophils Absolute: 0 10*3/uL (ref 0.0–0.5)
Eosinophils Relative: 0 %
HCT: 45.9 % (ref 36.0–46.0)
Hemoglobin: 15.5 g/dL — ABNORMAL HIGH (ref 12.0–15.0)
Lymphocytes Relative: 10 %
Lymphs Abs: 1.9 10*3/uL (ref 0.7–4.0)
MCH: 34.6 pg — ABNORMAL HIGH (ref 26.0–34.0)
MCHC: 33.8 g/dL (ref 30.0–36.0)
MCV: 102.5 fL — ABNORMAL HIGH (ref 80.0–100.0)
Metamyelocytes Relative: 13 %
Monocytes Absolute: 0.9 10*3/uL (ref 0.1–1.0)
Monocytes Relative: 5 %
Myelocytes: 8 %
Neutro Abs: 11.9 10*3/uL — ABNORMAL HIGH (ref 1.7–7.7)
Neutrophils Relative %: 38 %
Platelets: 159 10*3/uL (ref 150–400)
Promyelocytes Relative: 1 %
RBC: 4.48 MIL/uL (ref 3.87–5.11)
RDW: 13.6 % (ref 11.5–15.5)
WBC Morphology: INCREASED
WBC: 18.9 10*3/uL — ABNORMAL HIGH (ref 4.0–10.5)
nRBC: 0 % (ref 0.0–0.2)

## 2021-02-14 LAB — BLOOD GAS, ARTERIAL
Acid-Base Excess: 2.8 mmol/L — ABNORMAL HIGH (ref 0.0–2.0)
Acid-Base Excess: 2.4 mmol/L — ABNORMAL HIGH (ref 0.0–2.0)
Bicarbonate: 24.4 mmol/L (ref 20.0–28.0)
Bicarbonate: 24.5 mmol/L (ref 20.0–28.0)
Drawn by: 27733
Drawn by: 27733
FIO2: 100
FIO2: 100
O2 Saturation: 95 %
O2 Saturation: 94.2 %
Patient temperature: 37
Patient temperature: 36.6
pCO2 arterial: 73.5 mmHg (ref 32.0–48.0)
pCO2 arterial: 68.4 mmHg (ref 32.0–48.0)
pH, Arterial: 7.231 — ABNORMAL LOW (ref 7.350–7.450)
pH, Arterial: 7.25 — ABNORMAL LOW (ref 7.350–7.450)
pO2, Arterial: 92.7 mmHg (ref 83.0–108.0)
pO2, Arterial: 79 mmHg — ABNORMAL LOW (ref 83.0–108.0)

## 2021-02-14 LAB — LACTIC ACID, PLASMA
Lactic Acid, Venous: 2.7 mmol/L (ref 0.5–1.9)
Lactic Acid, Venous: 2.1 mmol/L (ref 0.5–1.9)
Lactic Acid, Venous: 1.5 mmol/L (ref 0.5–1.9)
Lactic Acid, Venous: 1.8 mmol/L (ref 0.5–1.9)

## 2021-02-14 LAB — HIV ANTIBODY (ROUTINE TESTING W REFLEX): HIV Screen 4th Generation wRfx: NONREACTIVE

## 2021-02-14 LAB — POCT I-STAT 7, (LYTES, BLD GAS, ICA,H+H)
Acid-Base Excess: 2 mmol/L (ref 0.0–2.0)
Bicarbonate: 28.9 mmol/L — ABNORMAL HIGH (ref 20.0–28.0)
Calcium, Ion: 1.06 mmol/L — ABNORMAL LOW (ref 1.15–1.40)
HCT: 38 % (ref 36.0–46.0)
Hemoglobin: 12.9 g/dL (ref 12.0–15.0)
O2 Saturation: 95 %
Patient temperature: 98.2
Potassium: 5.2 mmol/L — ABNORMAL HIGH (ref 3.5–5.1)
Sodium: 137 mmol/L (ref 135–145)
TCO2: 31 mmol/L (ref 22–32)
pCO2 arterial: 55.4 mmHg — ABNORMAL HIGH (ref 32.0–48.0)
pH, Arterial: 7.324 — ABNORMAL LOW (ref 7.350–7.450)
pO2, Arterial: 84 mmHg (ref 83.0–108.0)

## 2021-02-14 LAB — ETHANOL: Alcohol, Ethyl (B): <10 mg/dL (ref <10)

## 2021-02-14 LAB — CBG MONITORING, ED: Glucose-Capillary: 136 mg/dL — ABNORMAL HIGH (ref 70–99)

## 2021-02-14 LAB — HEMOGLOBIN A1C
Hgb A1c MFr Bld: 5.8 % — ABNORMAL HIGH (ref 4.8–5.6)
Mean Plasma Glucose: 119.76 mg/dL

## 2021-02-14 LAB — BRAIN NATRIURETIC PEPTIDE
B Natriuretic Peptide: 309 pg/mL — ABNORMAL HIGH (ref 0.0–100.0)
B Natriuretic Peptide: 162 pg/mL — ABNORMAL HIGH (ref 0.0–100.0)

## 2021-02-14 LAB — MAGNESIUM
Magnesium: 1.6 mg/dL — ABNORMAL LOW (ref 1.7–2.4)
Magnesium: 1.6 mg/dL — ABNORMAL LOW (ref 1.7–2.4)

## 2021-02-14 LAB — MRSA NEXT GEN BY PCR, NASAL: MRSA by PCR Next Gen: NOT DETECTED

## 2021-02-14 LAB — RESP PANEL BY RT-PCR (FLU A&B, COVID) ARPGX2
Influenza A by PCR: NEGATIVE
Influenza B by PCR: NEGATIVE
SARS Coronavirus 2 by RT PCR: NEGATIVE

## 2021-02-14 LAB — PHOSPHORUS
Phosphorus: 6.4 mg/dL — ABNORMAL HIGH (ref 2.5–4.6)
Phosphorus: 5.2 mg/dL — ABNORMAL HIGH (ref 2.5–4.6)

## 2021-02-14 LAB — ACETAMINOPHEN LEVEL: Acetaminophen (Tylenol), Serum: <10 ug/mL — ABNORMAL LOW (ref 10–30)

## 2021-02-14 LAB — PROCALCITONIN: Procalcitonin: 28.89 ng/mL

## 2021-02-14 LAB — LIPASE, BLOOD: Lipase: 24 U/L (ref 11–51)

## 2021-02-14 MED ORDER — IPRATROPIUM-ALBUTEROL 0.5-2.5 (3) MG/3ML IN SOLN
3.0000 mL | Freq: Four times a day (QID) | RESPIRATORY_TRACT | Status: DC
  Administered 2021-02-14 – 2021-02-18 (×15): 3 mL via RESPIRATORY_TRACT
  Filled 2021-02-14 (×15): qty 3

## 2021-02-14 MED ORDER — LEVOFLOXACIN IN D5W 750 MG/150ML IV SOLN
750.0000 mg | Freq: Once | INTRAVENOUS | Status: AC
  Administered 2021-02-14: 750 mg via INTRAVENOUS
  Filled 2021-02-14: qty 150

## 2021-02-14 MED ORDER — SUCCINYLCHOLINE CHLORIDE 200 MG/10ML IV SOSY
PREFILLED_SYRINGE | INTRAVENOUS | Status: AC
  Administered 2021-02-14: 100 mg via INTRAVENOUS
  Filled 2021-02-14: qty 10

## 2021-02-14 MED ORDER — ORAL CARE MOUTH RINSE
15.0000 mL | OROMUCOSAL | Status: DC
  Administered 2021-02-14 – 2021-02-17 (×32): 15 mL via OROMUCOSAL

## 2021-02-14 MED ORDER — PROSOURCE TF PO LIQD
45.0000 mL | Freq: Two times a day (BID) | ORAL | Status: DC
  Administered 2021-02-14: 45 mL
  Filled 2021-02-14: qty 45

## 2021-02-14 MED ORDER — CITALOPRAM HYDROBROMIDE 20 MG PO TABS
20.0000 mg | ORAL_TABLET | Freq: Every day | ORAL | Status: DC
  Administered 2021-02-14 – 2021-02-18 (×5): 20 mg
  Filled 2021-02-14 (×5): qty 1

## 2021-02-14 MED ORDER — DEXMEDETOMIDINE HCL IN NACL 400 MCG/100ML IV SOLN
0.0000 ug/kg/h | INTRAVENOUS | Status: AC
  Administered 2021-02-14: 1.2 ug/kg/h via INTRAVENOUS
  Administered 2021-02-14: 0.4 ug/kg/h via INTRAVENOUS
  Administered 2021-02-15 – 2021-02-17 (×4): 1.2 ug/kg/h via INTRAVENOUS
  Administered 2021-02-17: 0.4 ug/kg/h via INTRAVENOUS
  Filled 2021-02-14 (×8): qty 100

## 2021-02-14 MED ORDER — DOCUSATE SODIUM 50 MG/5ML PO LIQD
100.0000 mg | Freq: Two times a day (BID) | ORAL | Status: DC
  Administered 2021-02-14 – 2021-02-17 (×6): 100 mg
  Filled 2021-02-14 (×6): qty 10

## 2021-02-14 MED ORDER — CHLORHEXIDINE GLUCONATE CLOTH 2 % EX PADS
6.0000 | MEDICATED_PAD | Freq: Every day | CUTANEOUS | Status: DC
  Administered 2021-02-14 – 2021-02-21 (×9): 6 via TOPICAL

## 2021-02-14 MED ORDER — POLYETHYLENE GLYCOL 3350 17 G PO PACK: 17.0000 g | PACK | Freq: Every day | ORAL | Status: DC

## 2021-02-14 MED ORDER — LACTATED RINGERS IV SOLN: INTRAVENOUS | Status: DC

## 2021-02-14 MED ORDER — LACTATED RINGERS IV BOLUS (SEPSIS)
1000.0000 mL | Freq: Once | INTRAVENOUS | Status: AC
  Administered 2021-02-14: 1000 mL via INTRAVENOUS

## 2021-02-14 MED ORDER — DOCUSATE SODIUM 50 MG/5ML PO LIQD
100.0000 mg | Freq: Two times a day (BID) | ORAL | Status: DC | PRN
  Administered 2021-02-14: 100 mg

## 2021-02-14 MED ORDER — CHLORHEXIDINE GLUCONATE 0.12% ORAL RINSE (MEDLINE KIT)
15.0000 mL | Freq: Two times a day (BID) | OROMUCOSAL | Status: DC
  Administered 2021-02-14 – 2021-02-17 (×6): 15 mL via OROMUCOSAL

## 2021-02-14 MED ORDER — ETOMIDATE 2 MG/ML IV SOLN: 20.0000 mg | Freq: Once | INTRAVENOUS | Status: AC

## 2021-02-14 MED ORDER — NOREPINEPHRINE 4 MG/250ML-% IV SOLN
0.0000 ug/min | INTRAVENOUS | Status: AC
  Administered 2021-02-14: 2 ug/min via INTRAVENOUS

## 2021-02-14 MED ORDER — ENOXAPARIN SODIUM 40 MG/0.4ML IJ SOSY
40.0000 mg | PREFILLED_SYRINGE | INTRAMUSCULAR | Status: DC
  Administered 2021-02-15 – 2021-02-21 (×7): 40 mg via SUBCUTANEOUS
  Filled 2021-02-14 (×7): qty 0.4

## 2021-02-14 MED ORDER — ENOXAPARIN SODIUM 30 MG/0.3ML IJ SOSY
30.0000 mg | PREFILLED_SYRINGE | INTRAMUSCULAR | Status: DC
  Administered 2021-02-14: 30 mg via SUBCUTANEOUS
  Filled 2021-02-14: qty 0.3

## 2021-02-14 MED ORDER — VITAL HIGH PROTEIN PO LIQD
1000.0000 mL | ORAL | Status: DC
  Administered 2021-02-14: 1000 mL

## 2021-02-14 MED ORDER — INSULIN ASPART 100 UNIT/ML IJ SOLN
0.0000 [IU] | INTRAMUSCULAR | Status: DC
  Administered 2021-02-14: 3 [IU] via SUBCUTANEOUS
  Administered 2021-02-14 – 2021-02-15 (×2): 2 [IU] via SUBCUTANEOUS
  Administered 2021-02-15: 3 [IU] via SUBCUTANEOUS
  Administered 2021-02-15 – 2021-02-18 (×11): 2 [IU] via SUBCUTANEOUS
  Administered 2021-02-18: 3 [IU] via SUBCUTANEOUS
  Administered 2021-02-18 – 2021-02-20 (×9): 2 [IU] via SUBCUTANEOUS
  Administered 2021-02-21 (×2): 3 [IU] via SUBCUTANEOUS

## 2021-02-14 MED ORDER — NOREPINEPHRINE 4 MG/250ML-% IV SOLN
2.0000 ug/min | INTRAVENOUS | Status: DC
  Administered 2021-02-14: 2 ug/min via INTRAVENOUS
  Filled 2021-02-14: qty 250

## 2021-02-14 MED ORDER — NALOXONE HCL 0.4 MG/ML IJ SOLN
0.4000 mg | Freq: Once | INTRAMUSCULAR | Status: AC
  Administered 2021-02-14: 0.4 mg via INTRAVENOUS
  Filled 2021-02-14: qty 1

## 2021-02-14 MED ORDER — MIDAZOLAM HCL 5 MG/5ML IJ SOLN
5.0000 mg | Freq: Once | INTRAMUSCULAR | Status: AC
Start: 2021-02-14 — End: 2021-02-14
  Administered 2021-02-14: 5 mg via INTRAVENOUS
  Filled 2021-02-14: qty 5

## 2021-02-14 MED ORDER — PANTOPRAZOLE SODIUM 40 MG IV SOLR
40.0000 mg | Freq: Every day | INTRAVENOUS | Status: DC
  Administered 2021-02-14 – 2021-02-15 (×2): 40 mg via INTRAVENOUS
  Filled 2021-02-14 (×2): qty 40

## 2021-02-14 MED ORDER — FENTANYL BOLUS VIA INFUSION
50.0000 ug | INTRAVENOUS | Status: DC | PRN
  Administered 2021-02-14 (×2): 100 ug via INTRAVENOUS
  Administered 2021-02-14: 50 ug via INTRAVENOUS
  Administered 2021-02-14: 100 ug via INTRAVENOUS
  Administered 2021-02-14: 50 ug via INTRAVENOUS
  Administered 2021-02-15 – 2021-02-16 (×2): 100 ug via INTRAVENOUS
  Filled 2021-02-14: qty 100

## 2021-02-14 MED ORDER — METRONIDAZOLE 500 MG/100ML IV SOLN
500.0000 mg | Freq: Two times a day (BID) | INTRAVENOUS | Status: DC
  Administered 2021-02-14: 500 mg via INTRAVENOUS
  Filled 2021-02-14: qty 100

## 2021-02-14 MED ORDER — ALPRAZOLAM 0.5 MG PO TABS: 0.5000 mg | ORAL_TABLET | Freq: Three times a day (TID) | ORAL | Status: DC

## 2021-02-14 MED ORDER — LACTATED RINGERS IV SOLN: INTRAVENOUS | Status: AC

## 2021-02-14 MED ORDER — FENTANYL 2500MCG IN NS 250ML (10MCG/ML) PREMIX INFUSION
0.0000 ug/h | INTRAVENOUS | Status: DC
  Administered 2021-02-14: 300 ug/h via INTRAVENOUS
  Administered 2021-02-14: 350 ug/h via INTRAVENOUS
  Administered 2021-02-15: 09:00:00 225 ug/h via INTRAVENOUS
  Administered 2021-02-15 – 2021-02-16 (×3): 250 ug/h via INTRAVENOUS
  Administered 2021-02-16: 300 ug/h via INTRAVENOUS
  Administered 2021-02-17: 275 ug/h via INTRAVENOUS
  Filled 2021-02-14 (×8): qty 250

## 2021-02-14 MED ORDER — ROCURONIUM BROMIDE 10 MG/ML (PF) SYRINGE: 80.0000 mg | PREFILLED_SYRINGE | Freq: Once | INTRAVENOUS | Status: DC

## 2021-02-14 MED ORDER — SODIUM CHLORIDE 0.9% FLUSH
10.0000 mL | INTRAVENOUS | Status: DC | PRN
  Administered 2021-02-18: 3 mL

## 2021-02-14 MED ORDER — LEVOFLOXACIN IN D5W 750 MG/150ML IV SOLN
750.0000 mg | INTRAVENOUS | Status: DC
  Filled 2021-02-14: qty 150

## 2021-02-14 MED ORDER — MIDAZOLAM HCL 2 MG/2ML IJ SOLN
1.0000 mg | INTRAMUSCULAR | Status: DC | PRN
  Administered 2021-02-14 – 2021-02-17 (×5): 2 mg via INTRAVENOUS
  Filled 2021-02-14 (×5): qty 2

## 2021-02-14 MED ORDER — MIDAZOLAM-SODIUM CHLORIDE 100-0.9 MG/100ML-% IV SOLN
0.5000 mg/h | INTRAVENOUS | Status: DC
  Administered 2021-02-14: 0.5 mg/h via INTRAVENOUS

## 2021-02-14 MED ORDER — LEVOFLOXACIN IN D5W 500 MG/100ML IV SOLN: 500.0000 mg | INTRAVENOUS | Status: DC

## 2021-02-14 MED ORDER — CITALOPRAM HYDROBROMIDE 10 MG/5ML PO SOLN
20.0000 mg | Freq: Every day | ORAL | Status: DC
  Filled 2021-02-14: qty 10

## 2021-02-14 MED ORDER — VANCOMYCIN HCL 1500 MG/300ML IV SOLN: 1500.0000 mg | INTRAVENOUS | Status: DC

## 2021-02-14 MED ORDER — SODIUM CHLORIDE 0.9 % IV SOLN
250.0000 mL | INTRAVENOUS | Status: DC
  Administered 2021-02-16 – 2021-02-18 (×2): 250 mL via INTRAVENOUS

## 2021-02-14 MED ORDER — FENTANYL CITRATE (PF) 100 MCG/2ML IJ SOLN: 50.0000 ug | Freq: Once | INTRAMUSCULAR | Status: DC

## 2021-02-14 MED ORDER — SUCCINYLCHOLINE CHLORIDE 200 MG/10ML IV SOSY: 100.0000 mg | PREFILLED_SYRINGE | Freq: Once | INTRAVENOUS | Status: AC

## 2021-02-14 MED ORDER — AMITRIPTYLINE HCL 50 MG PO TABS
75.0000 mg | ORAL_TABLET | Freq: Every day | ORAL | Status: DC
  Administered 2021-02-14 – 2021-02-17 (×4): 75 mg
  Filled 2021-02-14 (×4): qty 2

## 2021-02-14 MED ORDER — ETOMIDATE 2 MG/ML IV SOLN
INTRAVENOUS | Status: AC
  Administered 2021-02-14: 20 mg via INTRAVENOUS
  Filled 2021-02-14: qty 10

## 2021-02-14 MED ORDER — VORTIOXETINE HBR 5 MG PO TABS
10.0000 mg | ORAL_TABLET | Freq: Every day | ORAL | Status: DC
  Administered 2021-02-14 – 2021-02-18 (×5): 10 mg
  Filled 2021-02-14 (×5): qty 2

## 2021-02-14 MED ORDER — SODIUM ZIRCONIUM CYCLOSILICATE 10 G PO PACK
10.0000 g | PACK | Freq: Once | ORAL | Status: AC
  Administered 2021-02-14: 10 g
  Filled 2021-02-14: qty 1

## 2021-02-14 MED ORDER — SODIUM CHLORIDE 0.9% FLUSH
10.0000 mL | Freq: Two times a day (BID) | INTRAVENOUS | Status: DC
  Administered 2021-02-14: 30 mL
  Administered 2021-02-15 – 2021-02-18 (×5): 10 mL

## 2021-02-14 MED ORDER — SUCRALFATE 1 GM/10ML PO SUSP
1.0000 g | Freq: Two times a day (BID) | ORAL | Status: DC
  Administered 2021-02-14 – 2021-02-18 (×9): 1 g
  Filled 2021-02-14 (×10): qty 10

## 2021-02-14 MED ORDER — VITAL AF 1.2 CAL PO LIQD
1000.0000 mL | ORAL | Status: DC
  Administered 2021-02-14 – 2021-02-16 (×3): 1000 mL
  Filled 2021-02-14 (×2): qty 1000

## 2021-02-14 MED ORDER — SUCCINYLCHOLINE CHLORIDE 200 MG/10ML IV SOSY
100.0000 mg | PREFILLED_SYRINGE | Freq: Once | INTRAVENOUS | Status: AC
Start: 2021-02-14 — End: 2021-02-14
  Administered 2021-02-14: 100 mg via INTRAVENOUS

## 2021-02-14 MED ORDER — CLONAZEPAM 0.5 MG PO TBDP
0.5000 mg | ORAL_TABLET | Freq: Two times a day (BID) | ORAL | Status: DC
  Administered 2021-02-14 – 2021-02-18 (×9): 0.5 mg
  Filled 2021-02-14 (×9): qty 1

## 2021-02-14 MED ORDER — FENTANYL 2500MCG IN NS 250ML (10MCG/ML) PREMIX INFUSION
0.0000 ug/h | INTRAVENOUS | Status: DC
  Administered 2021-02-14: 25 ug/h via INTRAVENOUS
  Filled 2021-02-14: qty 250

## 2021-02-14 MED ORDER — ACETAMINOPHEN 160 MG/5ML PO SOLN
650.0000 mg | ORAL | Status: DC | PRN
  Administered 2021-02-14 – 2021-02-16 (×3): 650 mg
  Filled 2021-02-14 (×3): qty 20.3

## 2021-02-14 MED ORDER — POLYETHYLENE GLYCOL 3350 17 G PO PACK: 17.0000 g | PACK | Freq: Every day | ORAL | Status: DC | PRN

## 2021-02-14 NOTE — ED Notes (Signed)
Pt continues to wake up and attempt to pull tube out. Dr. Deretha Emory with new verbal orders to give extra 100mg  of succinylcholine d/t increased agitation and difficulty inserting OG at this time.

## 2021-02-14 NOTE — Progress Notes (Signed)
Peripherally Inserted Central Catheter Placement  The IV Nurse has discussed with the patient and/or persons authorized to consent for the patient, the purpose of this procedure and the potential benefits and risks involved with this procedure.  The benefits include less needle sticks, lab draws from the catheter, and the patient may be discharged home with the catheter. Risks include, but not limited to, infection, bleeding, blood clot (thrombus formation), and puncture of an artery; nerve damage and irregular heartbeat and possibility to perform a PICC exchange if needed/ordered by physician.  Alternatives to this procedure were also discussed.  Bard Power PICC patient education guide, fact sheet on infection prevention and patient information card has been provided to patient /or left at bedside.    PICC Placement Documentation  PICC Triple Lumen 02/14/21 PICC Right Basilic 42 cm 0 cm (Active)  Indication for Insertion or Continuance of Line Vasoactive infusions 02/14/21 1707  Exposed Catheter (cm) 0 cm 02/14/21 1707  Site Assessment Clean;Dry;Intact 02/14/21 1707  Lumen #1 Status Flushed;Saline locked;Blood return noted 02/14/21 1707  Lumen #2 Status Flushed;Saline locked;Blood return noted 02/14/21 1707  Lumen #3 Status Flushed;Saline locked;Blood return noted 02/14/21 1707  Dressing Type Transparent;Securing device 02/14/21 1707  Dressing Status Clean;Dry;Intact 02/14/21 1707  Antimicrobial disc in place? Yes 02/14/21 1707  Safety Lock Not Applicable 02/14/21 1707  Line Adjustment (NICU/IV Team Only) No 02/14/21 1707  Dressing Intervention New dressing;Other (Comment) 02/14/21 1707  Dressing Change Due 02/21/21 02/14/21 1707    Consent signed by patient's daughter, Horald Chestnut, via telephone. Verified by 2 PICC RNs.   Annett Fabian 02/14/2021, 5:09 PM

## 2021-02-14 NOTE — ED Triage Notes (Signed)
Pt brought to ED via [facility] emergency traffic. [facility] called to house for unresponsive. Family found patient at 0645 this am, pt had dry vomit on her. Family states she was sick yesterday with diarrhea. Pt will mumble at times. CBG with EMS 161, 132. BP 90's systolic last 12/58 o2 on RA 60's placed on NR brought to 88%. Capnography 60's, HR 90-100. Unable to obtain IV access.

## 2021-02-14 NOTE — Progress Notes (Addendum)
eLink Physician-Brief Progress Note Patient Name: [name] DOB: Sep 04, 1970 MRN: [id number]   Date of Service  02/14/2021  HPI/Events of Note  Patient with soft blood pressures, she's had 30 ml / kg of crystalloid, BNP was 309 at baseline, and she has small bilateral pleural effusions. Sats in the low 90's.  eICU Interventions  Peripheral Norepinephrine gtt ordered. Will re-check BNP as well. Restraints ordered.        Thomasene Lot Holman Bonsignore 02/14/2021, 8:29 PM

## 2021-02-14 NOTE — Progress Notes (Signed)
Initial Nutrition Assessment  DOCUMENTATION CODES:   Morbid obesity  INTERVENTION:   Initiate tube feeding via OG tube: Vital AF 1.2 at 25 ml/h, increase by 10 ml every 4 hours to goal rate of 65 ml/h (1560 ml per day)  Provides 1872 kcal, 117 gm protein, 1265 ml free water daily.  NUTRITION DIAGNOSIS:   Inadequate oral intake related to inability to eat as evidenced by NPO status.  GOAL:   Patient will meet greater than or equal to 90% of their needs  MONITOR:   Vent status, Labs, TF tolerance  REASON FOR ASSESSMENT:   Ventilator, Consult Enteral/tube feeding initiation and management  ASSESSMENT:   51 yo female admitted with respiratory failure after being found unresponsive this morning, aspiration PNA, sepsis, AKI. PMH includes Factor V Leiden mutation, HTN, chronic laryngitis, anxiety, chronic pain, esophagitis, scoliosis, GERD, HLD.  Family reported recent GI illness with vomiting and diarrhea. Received MD Consult for TF initiation and management. OG tube in place. Urine tox screen positive for amphetamines and benzodiazepines.   Patient is currently intubated on ventilator support MV: 8.1 L/min Temp (24hrs), Avg:98.3 F (36.8 C), Min:97.9 F (36.6 C), Max:98.6 F (37 C)   Labs reviewed. K 5.2, phos 6.4, mag 1.6, A1C 5.8 CBG: 106  Medications reviewed and include colace, novolog, protonix, sucralfate, precedex, fentanyl.   No recent weights available for review.   NUTRITION - FOCUSED PHYSICAL EXAM:  Unable to complete  Diet Order:   Diet Order             Diet NPO time specified  Diet effective now                   EDUCATION NEEDS:   No education needs have been identified at this time  Skin:  Skin Assessment: Reviewed RN Assessment  Last BM:  no BM documented  Height:   Ht Readings from Last 1 Encounters:  02/14/21 5\' 2"  (1.575 m)    Weight:   Wt Readings from Last 1 Encounters:  02/14/21 107.5 kg    BMI:  Body mass  index is 43.35 kg/m.  Estimated Nutritional Needs:   Kcal:  1800  Protein:  100-125 gm  Fluid:  >/= 1.8 L    04/14/21 RD, LDN, CNSC Please refer to Amion for contact information.

## 2021-02-14 NOTE — H&P (Addendum)
NAME:  [name], MRN:  [id number], DOB:  1970-06-09, LOS: 0 ADMISSION DATE:  02/14/2021, CONSULTATION DATE:  2/3 REFERRING MD:  wert  CHIEF COMPLAINT:  respiratory failure   History of Present Illness:  51 year old female found unresponsive the am of 2/3. For few Days prior family reporting GI illness w/ vomiting & diarrhea. Room air sats 80s, only arouses  to sternal rub. Dried emesis on face.  ER eval LA 2.7, K 5.5 creatine 2.58, got narcan w/ some improvement in mental status. CXR showed RLL airspace disease. Abx started. ABG obtained: 7.23/74/92/24 intubated.  Transferred to [facility] for admit.   Pertinent  Medical History  Factor V Leiden heterozygous mutation, essential HTN, chronic laryngitis, anxiety w/ panic attacks, chronic pain, prior esophagitis, scoliosis   Significant Hospital Events: Including procedures, antibiotic start and stop dates in addition to other pertinent events   2/3 admitted w/ acute resp failure 2/2 aspiration PNA and sepsis. IV Levaquin (anaphylaxis reported to PCN). Intubated in ER   Interim History / Subjective:  Appears comfortable on vent   Objective   Blood pressure 101/73, pulse 85, temperature 98.2 F (36.8 C), resp. rate (Abnormal) 25, height 5\' 2"  (1.575 m), weight 107.5 kg, SpO2 94 %.    Vent Mode: PRVC FiO2 (%):  [60 %-100 %] 100 % Set Rate:  [20 bmp-26 bmp] 26 bmp Vt Set:  [400 mL] 400 mL PEEP:  [5 cmH20-8 cmH20] 8 cmH20 Plateau Pressure:  [13 cmH20-22 cmH20] 22 cmH20   Intake/Output Summary (Last 24 hours) at 02/14/2021 1425 Last data filed at 02/14/2021 1239 Gross per 24 hour  Intake 1249.15 ml  Output 660 ml  Net 589.15 ml   Filed Weights   02/14/21 0734 02/14/21 1400  Weight: 89.7 kg 107.5 kg    Examination: General: obese 51 year old female. Sedated on fent and versed on arrival.  HENT: NCAT no JVD. Orally intubated. OGT in place Lungs: coarse bilateral breath sounds. Pplat 28 sats mid 90s. VT 8 ml/kg/rr 26/peep 8 PCXR  personally reviewed ETT good position. RLL and RML airspace disease  Cardiovascular: RRR w/out MRG Abdomen: soft  Extremities: her neck arms and legs are erythremic and flushed. This does Not extend to her thorax  Neuro: opens eyes., localizes  GU: clear yellow w/ folwy cath   Resolved Hospital Problem list     Assessment & Plan:  Principal Problem:   Respiratory failure with hypoxia and hypercapnia (HCC) Active Problems:   Aspiration pneumonia of right lower lobe due to vomit (HCC)   Sepsis with acute organ dysfunction (HCC)   Lactic acidosis   Toxic metabolic encephalopathy   AKI (acute kidney injury) ([place])   Alteration in electrolyte and fluid balance   Elevated LFTs   Depression with anxiety   Chronic pain syndrome   Factor V Leiden mutation ([place])   H/O gastroesophageal reflux (GERD)   Hyperlipidemia  Acute hypoxic and hypercarbic respiratory failure 2/2 aspiration PNA w/ marked Right LL and RML involvement  Plan Respiratory culture  Day 1 levaquin (given reported anaphylaxis to PCN). Has some flushing of upper and lower extremities. Does not appear like rash but need to keep monitoring in case also sensitivities to levaquin Ck MRSA PCR Full vent support  Repeat abg (may need to make further Ve adjustments) VAP bundle  PAD protocol RASS goal -2 PCT protocol  Am cxr   Sepsis w/ organ dysfxn and lactic acidosis due to aspiration PNA +/- possible GI infection as  well -volume depleted -abx and 21ml/kg administered in ER Plan Repeat LA  Cont IVF Abx as above Ck GI pathogens PCR  AKI  Plan Renal dose meds  IVFs Hold home Celebrex, HCTZ,  ace-I Keep foley Am chem   Fluid and electrolyte imbalance: Hyperkalemia, hyponatremia, Plan Repeating chem  Cont LR at 125 ml for now   Acute toxic and metabolic encephalopathy superimposed on h/o depression/anxiety and chronic pain syndrome -responded to narcan so ? Unintentional overdose superimposed on infection?   Plan PAD protocol fent and precedex  Supportive care Treat sepsis and resp failure Cont folic acid Cont Celexa  Hold Lyrica for now (was on 200 mg tid) w/ AKI Hold adderall Cont Elavil 75mg  q hs VT Cont Trintellix 10mg /d Hold Robaxin  0.5 clonazepam for xanax (as replacement because of elevated LFTs and AKI)   Elevated LFTs Likely 2/2 sepsis  Plan Treat sepsis Hold statin  Repeat LFTs am   GERD w/ h/o gastritis  Plan PPI and cont sucralfate   Factor V Leiden mutation Plan [place] LMWH   Best Practice (right click and "Reselect all SmartList Selections" daily)   Diet/type: tubefeeds DVT prophylaxis: LMWH GI prophylaxis: PPI Lines: N/A Foley:  Yes, and it is still needed Code Status:  full code Last date of multidisciplinary goals of care discussion [pending ]  Labs   CBC: Recent Labs  Lab 02/14/21 0742  WBC 18.9*  NEUTROABS 11.9*  HGB 15.5*  HCT 45.9  MCV 102.5*  PLT [id number]    Basic Metabolic Panel: Recent Labs  Lab 02/14/21 0742  NA 134*  K 5.5*  CL 94*  CO2 28  GLUCOSE 137*  BUN 59*  CREATININE 2.68*  CALCIUM 7.6*   GFR: Estimated Creatinine Clearance: 29 mL/min (A) (by C-G formula based on SCr of 2.68 mg/dL (H)). Recent Labs  Lab 02/14/21 0742 02/14/21 1009  WBC 18.9*  --   LATICACIDVEN 2.7* 2.1*    Liver Function Tests: Recent Labs  Lab 02/14/21 0742  AST 90*  ALT 63*  ALKPHOS 164*  BILITOT 1.3*  PROT 6.9  ALBUMIN 3.0*   Recent Labs  Lab 02/14/21 0742  LIPASE 24   No results for input(s): AMMONIA in the last 168 hours.  ABG    Component Value Date/Time   PHART 7.250 (L) 02/14/2021 1230   PCO2ART 68.4 (HH) 02/14/2021 1230   PO2ART 79.0 (L) 02/14/2021 1230   HCO3 24.5 02/14/2021 1230   TCO2 24 12/05/2015 2148   O2SAT 94.2 02/14/2021 1230     Coagulation Profile: No results for input(s): INR, PROTIME in the last 168 hours.  Cardiac Enzymes: No results for input(s): CKTOTAL, CKMB, CKMBINDEX, TROPONINI in the last 168  hours.  HbA1C: Hgb A1c MFr Bld  Date/Time Value Ref Range Status  08/31/2016 08:50 AM 5.2 4.8 - 5.6 % Final    Comment:    (NOTE) Pre diabetes:          5.7%-6.4% Diabetes:              >6.4% Glycemic control for   <7.0% adults with diabetes     CBG: Recent Labs  Lab 02/14/21 0735  GLUCAP 136*    Review of Systems:   Not able   Past Medical History:  She,  has a past medical history of Anxiety, Back pain, Chronic back pain (02/04/13), Depression, Factor 5 Leiden mutation, heterozygous ([place]), Hypertension, Laryngitis, chronic, Panic attack, Scoliosis, and Ulcer.   Surgical History:   Past Surgical History:  Procedure Laterality Date   BACK SURGERY     CESAREAN SECTION     CHOLECYSTECTOMY  11/27/2011   Procedure: LAPAROSCOPIC CHOLECYSTECTOMY;  Surgeon: Donato Heinz, MD;  Location: [facility];  Service: General;  Laterality: N/A;   EMBOLECTOMY Left 12/06/2015   Procedure: EMBOLECTOMY LEFT ARM;  Surgeon: Elam Dutch, MD;  Location: [facility];  Service: Vascular;  Laterality: Left;   NECK SURGERY     TUBAL LIGATION       Social History:   reports that she has been smoking cigarettes. She has a 37.50 pack-year smoking history. She has never used smokeless tobacco. She reports that she does not drink alcohol and does not use drugs.   Family History:  Her family history includes Arthritis in an other family member; Asthma in an other family member; Cancer in an other family member; Diabetes in an other family member; Heart disease in an other family member; Lung disease in an other family member.   Allergies Allergies  Allergen Reactions   Penicillins Anaphylaxis    Has patient had a PCN reaction causing immediate rash, facial/tongue/throat swelling, SOB or lightheadedness with hypotension: No Has patient had a PCN reaction causing severe rash involving mucus membranes or skin necrosis: No Has patient had a PCN reaction that required hospitalization No Has patient had a  PCN reaction occurring within the last 10 years: No If all of the above answers are "NO", then may proceed with Cephalosporin use.    Ibuprofen Nausea And Vomiting    GI upset, Shakes   Peppermint Flavor [Flavoring Agent]     Allergic to pepper the spice   Aspirin Nausea And Vomiting    Gi upset     Home Medications  Prior to Admission medications   Medication Sig Start Date End Date Taking? Authorizing Provider  celecoxib (CELEBREX) 200 MG capsule Take 200 mg by mouth 2 (two) times daily. 12/26/19  Yes [provider]  hydrochlorothiazide (HYDRODIURIL) 12.5 MG tablet Take 12.5 mg by mouth daily. 12/18/19  Yes [provider]  lisinopril (PRINIVIL,ZESTRIL) 20 MG tablet Take 1 tablet (20 mg total) by mouth daily. 12/14/15  Yes Oswald Hillock, MD  magnesium oxide (MAG-OX) 400 MG tablet Take 400 mg by mouth daily.   Yes [provider]  methocarbamol (ROBAXIN) 750 MG tablet Take 750 mg by mouth 4 (four) times daily.   Yes [provider]  NON FORMULARY NERVIVE (nerve relief)   Yes [provider]  pantoprazole (PROTONIX) 40 MG tablet Take 40 mg by mouth daily.   Yes [provider]  potassium chloride SA (K-DUR,KLOR-CON) 20 MEQ tablet Take 1 tablet (20 mEq total) by mouth daily. 09/02/16 02/14/21 Yes Tat, Shanon Brow, MD  sucralfate (CARAFATE) 1 g tablet Take 1 tablet (1 g total) by mouth 2 (two) times daily. 02/17/20  Yes Aberman, Caroline C, PA-C  TRINTELLIX 10 MG TABS tablet Take 10 mg by mouth daily. 01/01/20  Yes [provider]  Vitamin D, Ergocalciferol, (DRISDOL) 1.25 MG (50000 UNIT) CAPS capsule Take 50,000 Units by mouth 2 (two) times a week. 02/16/20  Yes [provider]  albuterol (PROVENTIL HFA;VENTOLIN HFA) 108 (90 BASE) MCG/ACT inhaler Inhale 2 puffs into the lungs every 4 (four) hours as needed for wheezing or shortness of breath. 05/03/14   Ward, Delice Bison, DO  ALPRAZolam (XANAX) 1 MG tablet Take 1 tablet (1 mg total) by  mouth 4 (four) times daily. 12/07/15   Alvia Grove, PA-C  amitriptyline (ELAVIL) 25 MG tablet Take 25 mg by mouth at bedtime. 02/08/20   [provider]  amphetamine-dextroamphetamine (ADDERALL) 10 MG tablet Take 10 mg by mouth 3 (three) times daily. 8a, 12p, 3p Patient not taking: Reported on 02/17/2020    [provider]  aspirin EC 81 MG EC tablet Take 1 tablet (81 mg total) by mouth daily. 12/08/15   Alvia Grove, PA-C  aspirin-sod bicarb-citric acid (ALKA-SELTZER) 325 MG TBEF tablet Take 325 mg by mouth every 6 (six) hours as needed.    [provider]  atorvastatin (LIPITOR) 20 MG tablet Take 20 mg by mouth daily.    [provider]  bismuth subsalicylate (PEPTO BISMOL) 262 MG chewable tablet Chew 524 mg by mouth as needed.    [provider]  citalopram (CELEXA) 10 MG tablet Take 10 mg by mouth daily.    [provider]  fluticasone (FLONASE) 50 MCG/ACT nasal spray Place 1 spray into both nostrils at bedtime.    [provider]  folic acid (FOLVITE) 1 MG tablet Take 1 tablet (1 mg total) by mouth daily. Patient not taking: No sig reported 09/01/16   Tat, Shanon Brow, MD  ondansetron (ZOFRAN ODT) 4 MG disintegrating tablet Take 1 tablet (4 mg total) by mouth every 8 (eight) hours as needed for nausea or vomiting. 02/17/20   Suzy Bouchard, PA-C  oxyCODONE-acetaminophen (PERCOCET) 7.5-325 MG tablet Take 1 tablet by mouth 3 (three) times daily as needed for severe pain. Patient not taking: Reported on 02/17/2020    [provider]  pregabalin (LYRICA) 200 MG capsule Take 200 mg by mouth in the morning, at noon, and at bedtime. 01/04/20   [provider]  sucralfate (CARAFATE) 1 g tablet Take 1 tablet (1 g total) by mouth 2 (two) times daily. Patient not taking: Reported on 02/14/2021 12/07/15   Virgina Jock A, PA-C  vitamin B-12 500 MCG tablet Take 1 tablet (500 mcg total) by mouth daily. Patient not taking:  Reported on 02/17/2020 09/01/16   TatShanon Brow, MD  VYVANSE 50 MG capsule Take 50 mg by mouth every morning. 01/22/20   [provider]  zolpidem (AMBIEN) 10 MG tablet Take 10 mg by mouth at bedtime. 01/22/20   [provider]     Critical care time: 45 minutes     Erick Colace ACNP-BC [place] Pager # [phone] OR # [phone] if no answer

## 2021-02-14 NOTE — ED Notes (Signed)
Spouse at bedside, updated on care and status. Added to pt contacts for updates as son is currently in jail.

## 2021-02-14 NOTE — ED Notes (Signed)
Updated pt's daughter amber who call to check on her. States pt's primary contact is in jail and would like updates sent to her since he cannot receive them. Phone number is [phone]

## 2021-02-14 NOTE — ED Notes (Signed)
Respiratory called and informed of pCO2 level. Informed would come down to bedside.

## 2021-02-14 NOTE — ED Notes (Signed)
Dr Rogene Houston at bedside to intubate patient.

## 2021-02-14 NOTE — ED Notes (Signed)
Pt intubated with 7.5 ET tube x 1 attempt by Dr Deretha Emory without difficulty 25 at lip. +CO2 change, bilateral breath sounds heard by Dr Deretha Emory.

## 2021-02-14 NOTE — Sepsis Progress Note (Signed)
eLink is following this Code Sepsis. °

## 2021-02-14 NOTE — ED Provider Notes (Addendum)
[facility] EMERGENCY DEPARTMENT Provider Note   CSN: [id number] Arrival date & time: 02/14/21  6237     History  Chief Complaint  Patient presents with   Altered Mental Status    [name] is a 51 y.o. female.  Patient brought in by EMS.  Patient found unresponsive by family this morning.  Stating that there has been a vomiting and diarrhea illness past few days.  Patient blood sugar in the field was above 100.  Patient arrived on 100% nonrebreather.  This that oxygen sats on room air were in the 80s.  Patient with significant altered mental status.  Not able to verbalize.  Will wake up some to sternal rub.  Patient arrived without an IV.  Patient with vomit down the back of her neck.  Past medical history significant for scoliosis hypertension panic attack depression chronic back pain factor V Leiden mutation.  All this obtained from chart review.   Patient has not been seen by [place] recently.  Last time was February 2022.        Home Medications Prior to Admission medications   Medication Sig Start Date End Date Taking? Authorizing Provider  albuterol (PROVENTIL HFA;VENTOLIN HFA) 108 (90 BASE) MCG/ACT inhaler Inhale 2 puffs into the lungs every 4 (four) hours as needed for wheezing or shortness of breath. 05/03/14   Ward, Layla Maw, DO  ALPRAZolam (XANAX) 1 MG tablet Take 1 tablet (1 mg total) by mouth 4 (four) times daily. 12/07/15   Raymond Gurney, PA-C  amitriptyline (ELAVIL) 25 MG tablet Take 25 mg by mouth at bedtime. 02/08/20   [provider]  amphetamine-dextroamphetamine (ADDERALL) 10 MG tablet Take 10 mg by mouth 3 (three) times daily. 8a, 12p, 3p Patient not taking: Reported on 02/17/2020    [provider]  aspirin EC 81 MG EC tablet Take 1 tablet (81 mg total) by mouth daily. 12/08/15   Raymond Gurney, PA-C  aspirin-sod bicarb-citric acid (ALKA-SELTZER) 325 MG TBEF tablet Take 325 mg by mouth every 6 (six) hours as needed.    [provider]  atorvastatin (LIPITOR) 20 MG tablet Take 20 mg by mouth daily.    [provider]  bismuth subsalicylate (PEPTO BISMOL) 262 MG chewable tablet Chew 524 mg by mouth as needed.    [provider]  celecoxib (CELEBREX) 200 MG capsule Take 200 mg by mouth daily. 12/26/19   [provider]  citalopram (CELEXA) 10 MG tablet Take 10 mg by mouth daily.    [provider]  fluticasone (FLONASE) 50 MCG/ACT nasal spray Place 1 spray into both nostrils at bedtime.    [provider]  folic acid (FOLVITE) 1 MG tablet Take 1 tablet (1 mg total) by mouth daily. Patient not taking: No sig reported 09/01/16   Tat, Onalee Hua, MD  hydrochlorothiazide (HYDRODIURIL) 12.5 MG tablet Take 12.5 mg by mouth daily. 12/18/19   [provider]  lisinopril (PRINIVIL,ZESTRIL) 20 MG tablet Take 1 tablet (20 mg total) by mouth daily. 12/14/15   Meredeth Ide, MD  methocarbamol (ROBAXIN) 750 MG tablet Take 750 mg by mouth 4 (four) times daily.    [provider]  ondansetron (ZOFRAN ODT) 4 MG disintegrating tablet Take 1 tablet (4 mg total) by mouth every 8 (eight) hours as needed for nausea or vomiting. 02/17/20   Mannie Stabile, PA-C  oxyCODONE-acetaminophen (PERCOCET) 7.5-325 MG tablet Take 1 tablet by mouth 3 (three) times daily as needed for severe pain.  Patient not taking: Reported on 02/17/2020    [provider]  pantoprazole (PROTONIX) 40 MG tablet Take 40 mg by mouth 2 (two) times daily.  Patient not taking: Reported on 02/17/2020    [provider]  potassium chloride SA (K-DUR,KLOR-CON) 20 MEQ tablet Take 1 tablet (20 mEq total) by mouth daily. 09/02/16 09/05/16  Catarina Hartshorn, MD  pregabalin (LYRICA) 200 MG capsule Take 200 mg by mouth in the morning, at noon, and at bedtime. 01/04/20   [provider]  sucralfate (CARAFATE) 1 g tablet Take 1 tablet (1 g total) by mouth 2 (two) times daily. 12/07/15   Raymond Gurney, PA-C   sucralfate (CARAFATE) 1 g tablet Take 1 tablet (1 g total) by mouth 2 (two) times daily. 02/17/20   Claudette Stapler C, PA-C  TRINTELLIX 10 MG TABS tablet Take 10 mg by mouth daily. 01/01/20   [provider]  vitamin B-12 500 MCG tablet Take 1 tablet (500 mcg total) by mouth daily. Patient not taking: Reported on 02/17/2020 09/01/16   TatOnalee Hua, MD  Vitamin D, Ergocalciferol, (DRISDOL) 1.25 MG (50000 UNIT) CAPS capsule Take 50,000 Units by mouth 2 (two) times a week. 02/16/20   [provider]  VYVANSE 50 MG capsule Take 50 mg by mouth every morning. 01/22/20   [provider]  zolpidem (AMBIEN) 10 MG tablet Take 10 mg by mouth at bedtime. 01/22/20   [provider]      Allergies    Penicillins, Ibuprofen, Peppermint flavor [flavoring agent], and Aspirin    Review of Systems   Review of Systems  Unable to perform ROS: Mental status change   Physical Exam Updated Vital Signs BP (!) 149/135    Pulse 94    Temp 98.6 F (37 C) (Axillary)    Resp (!) 23    Ht 1.575 m ( )    Wt 89.7 kg    SpO2 98%    BMI 36.17 kg/m  Physical Exam Vitals and nursing note reviewed.  Constitutional:      General: She is in acute distress.     Appearance: She is well-developed. She is ill-appearing. She is not diaphoretic.  HENT:     Head: Normocephalic and atraumatic.  Eyes:     Conjunctiva/sclera: Conjunctivae normal.     Comments: Pupils 2 to 3 mm  Cardiovascular:     Rate and Rhythm: Normal rate and regular rhythm.     Heart sounds: No murmur heard. Pulmonary:     Effort: Respiratory distress present.     Breath sounds: Rhonchi and rales present.  Abdominal:     General: There is distension.     Palpations: Abdomen is soft.     Tenderness: There is no guarding.  Musculoskeletal:        General: No swelling.     Cervical back: Neck supple. No rigidity.  Skin:    General: Skin is warm and dry.     Capillary Refill: Capillary refill takes less than 2 seconds.   Neurological:     Mental Status: She is alert.     Comments: Patient will wake up only sternal rub would not verbalize.  Spontaneously moving both feet.  And occasionally right arm.  While trying to start an IV.  Psychiatric:        Mood and Affect: Mood normal.    ED Results / Procedures / Treatments   Labs (all labs ordered are listed, but only abnormal results are displayed)  Labs Reviewed  LACTIC ACID, PLASMA - Abnormal; Notable for the following components:      Result Value   Lactic Acid, Venous 2.7 (*)    All other components within normal limits  COMPREHENSIVE METABOLIC PANEL - Abnormal; Notable for the following components:   Sodium 134 (*)    Potassium 5.5 (*)    Chloride 94 (*)    Glucose, Bld 137 (*)    BUN 59 (*)    Creatinine, Ser 2.68 (*)    Calcium 7.6 (*)    Albumin 3.0 (*)    AST 90 (*)    ALT 63 (*)    Alkaline Phosphatase 164 (*)    Total Bilirubin 1.3 (*)    GFR, Estimated 21 (*)    All other components within normal limits  CBC WITH DIFFERENTIAL/PLATELET - Abnormal; Notable for the following components:   WBC 18.9 (*)    Hemoglobin 15.5 (*)    MCV 102.5 (*)    MCH 34.6 (*)    All other components within normal limits  ACETAMINOPHEN LEVEL - Abnormal; Notable for the following components:   Acetaminophen (Tylenol), Serum <10 (*)    All other components within normal limits  CBG MONITORING, ED - Abnormal; Notable for the following components:   Glucose-Capillary 136 (*)    All other components within normal limits  CULTURE, BLOOD (ROUTINE X 2)  CULTURE, BLOOD (ROUTINE X 2)  RESP PANEL BY RT-PCR (FLU A&B, COVID) ARPGX2  LIPASE, BLOOD  ETHANOL  URINALYSIS, ROUTINE W REFLEX MICROSCOPIC  RAPID URINE DRUG SCREEN, HOSP PERFORMED  BLOOD GAS, ARTERIAL  BRAIN NATRIURETIC PEPTIDE    EKG None  Radiology DG Chest Port 1 View  Result Date: 02/14/2021 CLINICAL DATA:  Altered mental status, patient unresponsive this morning EXAM: PORTABLE CHEST 1 VIEW  COMPARISON:  05/01/2014 FINDINGS: Consolidation at the right lung base primarily silhouetting the right heart border favoring right middle lobe airspace opacity although a component of right lower lobe involvement is not excluded. Low lung volumes are present, causing crowding of the pulmonary vasculature. Upper normal heart size. Equivocal interstitial accentuation in the right upper lobe. The lower margin of the patient's cervical hardware is visible. IMPRESSION: 1. Consolidation at the right lung base primarily in the right middle lobe although right lower lobe involvement is not excluded. Possibilities include pneumonia/multilobar pneumonia, pulmonary hemorrhage, or aspiration pneumonitis. Equivocal interstitial accentuation in the right upper lobe. 2. Low lung volumes. Electronically Signed   By: Gaylyn Rong M.D.   On: 02/14/2021 08:08    Procedures Procedure Name: Intubation Date/Time: 02/14/2021 9:17 AM Performed by: Vanetta Mulders, MD Pre-anesthesia Checklist: Patient identified, Patient being monitored, Emergency Drugs available, Timeout performed and Suction available Oxygen Delivery Method: Non-rebreather mask Preoxygenation: Pre-oxygenation with 100% oxygen Induction Type: Rapid sequence Ventilation: Mask ventilation without difficulty Laryngoscope Size: Glidescope and 4 Grade View: Grade III Tube size: 7.5 mm Number of attempts: 1 Placement Confirmation: ETT inserted through vocal cords under direct vision, CO2 detector and Breath sounds checked- equal and bilateral Secured at: 25 cm Tube secured with: ETT holder Comments: Patient had very thick mucus around the vocal cords.     Cardiac monitoring showed sinus rhythm.  No widened QRS no peak T waves.  Medications Ordered in ED Medications  etomidate (AMIDATE) injection 20 mg (has no administration in time range)  succinylcholine (ANECTINE) syringe 100 mg (has no administration in time range)  succinylcholine (ANECTINE)  200 MG/10ML syringe (has no administration in time range)  etomidate (AMIDATE) 2 MG/ML injection (has no administration in time range)  levofloxacin (LEVAQUIN) IVPB 750 mg (has no administration in time range)  metroNIDAZOLE (FLAGYL) IVPB 500 mg (has no administration in time range)  lactated ringers infusion (has no administration in time range)  lactated ringers bolus 1,000 mL (has no administration in time range)    And  lactated ringers bolus 1,000 mL (has no administration in time range)    And  lactated ringers bolus 1,000 mL (has no administration in time range)  vancomycin (VANCOREADY) IVPB 1500 mg/300 mL (has no administration in time range)  naloxone [facility]) injection 0.4 mg (0.4 mg Intravenous Given 02/14/21 0750)  naloxone (NARCAN) injection 0.4 mg (0.4 mg Intravenous Given 02/14/21 0758)    ED Course/ Medical Decision Making/ A&P                           Medical Decision Making Amount and/or Complexity of Data Reviewed Labs: ordered. Radiology: ordered.  Risk Prescription drug management. Decision regarding hospitalization.   CRITICAL CARE Performed by: Vanetta Mulders Total critical care time: 95 minutes Critical care time was exclusive of separately billable procedures and treating other patients. Critical care was necessary to treat or prevent imminent or life-threatening deterioration. Critical care was time spent personally by me on the following activities: development of treatment plan with patient and/or surrogate as well as nursing, discussions with consultants, evaluation of patient's response to treatment, examination of patient, obtaining history from patient or surrogate, ordering and performing treatments and interventions, ordering and review of laboratory studies, ordering and review of radiographic studies, pulse oximetry and re-evaluation of patient's condition.  Patient with oxygen saturations on 100% nonrebreather in the low 90s.  We did have an  episode where patient was on actually room air on the nonrebreather.  But we got that corrected.  Patient's oxygen sats went back up to high 90s on that.  Patient pinked up.  Also patient received 0.4 mg of Narcan x2 significant improvement in her mental status able to verbalize some.  And denied having any pain.  Stated that she been having some vomiting.  Suspected aspiration based on the way her lungs sound.  With a lots of rales and rhonchi bilaterally.  In the low oxygen sats.  Chest x-ray seems to confirm her right middle lobe right lower lobe pneumonia.  Patient without any hypotension blood pressures were good with systolics of 140.  Not tachycardic.  However patient's white blood cell count came back at 18,000 lactic acid at 2.7.  Respiratory rate up.  Based on this patient started on sepsis protocol patient started on antibiotics for aspiration pneumonia Flagyl Levaquin and vancomycin because her allergy list says that she has anaphylaxis to penicillin however the questions that are listed next to it seem to imply that she does not have a serious active reaction.  But since we cannot get good information from her we will go with the penicillin allergic protocol for the pneumonia and have added on Flagyl for aspiration.  Respiratory therapy was down in the room.  Until she responded to Narcan we were planning on intubation.  Respiratory therapy is going to get an arterial blood gas so we can see where she is at 100% nonrebreather.  As part of the sepsis protocol patient will receive a 30 cc/kg fluid challenge.  This also will probably help because labs show that she is in acute kidney injury.  Potassium elevated  some but not in the danger zone of 5.5.  We will monitor that.  Cardiac monitoring showed sinus rhythm.  And the EKG really had no acute changes cardiac monitoring really showed sinus rhythm.  No widened QRS no peak T waves.  Patient will be started on fentanyl drip for sedation.   Patient intubated without any difficulties.  See intubation procedure note.  The decision to intubate her was based on her blood gas.  Oxygen saturations were good pH down some at 7.2 but the PCO2 was 73.5.  And patient already received Narcan x2 with little bit of improvement with her being more awake and talking some.  But she would get somnolent again.  Some of this could have been the elevated PCO2 for somnolence as well.  During the intubation patient had thick mucus around the vocal cords.  Which was suctioned prior to elevating her.  Clinically suspect pneumonia may be aspiration pneumonia sepsis acute kidney injury some hyperkalemia.  But just to 5.5.  So not significantly elevated.  Will contact critical care for discussion of admission to them.  Discussed with Dr. Katrinka Blazing from critical care down to [facility].  Patient has an ICU bed assigned at [facility].  Patient will be transferred to [facility] by [facility].  Patient's blood pressures have some question.  Patient had excellent cap refill in her fingertips.  Hands were warm.  Suspected that some of the blood pressures were getting were not accurate.  Patient's heart rate never went up.  At times we will get good blood pressures with systolic pressures above 100.  If [facility] when they come to transport her is more comfortable with norepinephrine hanging we can do that for them.  We had several struggles trying to sedate her.  Versed sedation and fentanyl is working best.   Final Clinical Impression(s) / ED Diagnoses Final diagnoses:  Sepsis, due to unspecified organism, unspecified whether acute organ dysfunction present [facility])  Aspiration pneumonia of right middle lobe due to vomit [facility])  AKI (acute kidney injury) [facility])    Rx / DC Orders ED Discharge Orders     None         Vanetta Mulders, MD 02/14/21 1740    Vanetta Mulders, MD 02/14/21 8144    Vanetta Mulders, MD 02/14/21 8185    Vanetta Mulders, MD 02/14/21 0945     Vanetta Mulders, MD 02/14/21 1232

## 2021-02-14 NOTE — ED Notes (Signed)
EDP notified of low BP. No new orders at this time. Continuing to monitor and attempt different sites with similar results. Pt HR stable and cap refill <3sec.

## 2021-02-14 NOTE — Progress Notes (Signed)
Pharmacy Antibiotic Note  [name] is a 51 y.o. female admitted on 02/14/2021 with pneumonia.  Pharmacy has been consulted for Vancomycin dosing.  Plan: Vancomycin 1500 mg IV Q 48 hrs. Goal AUC 400-550. Expected AUC: 492 SCr used: 2.68 Also, x 1 dose of levaquin 750mg  IV given in ED   Height: 5\' 2"  (157.5 cm) Weight: 89.7 kg (197 lb 12 oz) IBW/kg (Calculated) : 50.1  Temp (24hrs), Avg:98.6 F (37 C), Min:98.6 F (37 C), Max:98.6 F (37 C)  Recent Labs  Lab 02/14/21 0742  WBC 18.9*  CREATININE 2.68*  LATICACIDVEN 2.7*    Estimated Creatinine Clearance: 26.1 mL/min (A) (by C-G formula based on SCr of 2.68 mg/dL (H)).    Allergies  Allergen Reactions   Penicillins Anaphylaxis    Has patient had a PCN reaction causing immediate rash, facial/tongue/throat swelling, SOB or lightheadedness with hypotension: No Has patient had a PCN reaction causing severe rash involving mucus membranes or skin necrosis: No Has patient had a PCN reaction that required hospitalization No Has patient had a PCN reaction occurring within the last 10 years: No If all of the above answers are "NO", then may proceed with Cephalosporin use.    Ibuprofen Nausea And Vomiting    GI upset, Shakes   Peppermint Flavor [Flavoring Agent]     Allergic to pepper the spice   Aspirin Nausea And Vomiting    Gi upset    Antimicrobials this admission: vancomycin 2/3 >>  Levaquin 2/3 x 1 dose in ED  Microbiology results: 2/3 BCx: pending  MRSA PCR:   Thank you for allowing pharmacy to be a part of this patients care.  , BS Pharm D, BCPS Clinical Pharmacist Pager [phone] 02/14/2021 8:20 AM

## 2021-02-14 NOTE — ED Notes (Addendum)
Pt IV noted to be infiltrated in right AC. Stopped boluses and infusions. Put on hold at this time and will restart with new IV. PT difficult stick.   Pharmacy called to request midazolam drip as pt on maximum dose of fentanyl and still restless.

## 2021-02-14 NOTE — Progress Notes (Signed)
eLink Physician-Brief Progress Note Patient Name: [name] DOB: 05-10-1970 MRN: [id number]   Date of Service  02/14/2021  HPI/Events of Note  Patient with sub-optimal sedation on the ventilator.  eICU Interventions  Fentanyl ceiling increased to 300 mcg and PRN iv Versed added.        Thomasene Lot Ashaunte Standley 02/14/2021, 10:17 PM

## 2021-02-14 NOTE — Progress Notes (Signed)
PHARMACY NOTE:  ANTIMICROBIAL RENAL DOSAGE ADJUSTMENT  Current antimicrobial regimen includes a mismatch between antimicrobial dosage and estimated renal function.  As per policy approved by the Pharmacy & Therapeutics and Medical Executive Committees, the antimicrobial dosage will be adjusted accordingly.  Current antimicrobial dosage:  Levaquin 500 mg IV every 48 hours  Indication: Aspiration PNA  Renal Function:  Estimated Creatinine Clearance: 35.5 mL/min (A) (by C-G formula based on SCr of 2.19 mg/dL (H)). []      On intermittent HD, scheduled: []      On CRRT    Antimicrobial dosage has been changed to:  Levaquin 750 mg IV every 48 hours given Scr trending down from 2.68 to 2.19 on recheck today  Additional comments:   Thank you for allowing pharmacy to be a part of this patient's care.  , PharmD, BCCCP Clinical Pharmacist  Phone: [phone] 02/14/2021 6:00 PM  Please check AMION for all [facility] Pharmacy phone numbers After 10:00 PM, call Main Pharmacy [phone]

## 2021-02-15 ENCOUNTER — Inpatient Hospital Stay (HOSPITAL_COMMUNITY): Payer: Medicaid Other

## 2021-02-15 DIAGNOSIS — J9601 Acute respiratory failure with hypoxia: Secondary | ICD-10-CM | POA: Diagnosis not present

## 2021-02-15 DIAGNOSIS — N179 Acute kidney failure, unspecified: Secondary | ICD-10-CM | POA: Diagnosis not present

## 2021-02-15 DIAGNOSIS — E878 Other disorders of electrolyte and fluid balance, not elsewhere classified: Secondary | ICD-10-CM

## 2021-02-15 DIAGNOSIS — J9602 Acute respiratory failure with hypercapnia: Secondary | ICD-10-CM | POA: Diagnosis not present

## 2021-02-15 LAB — POCT I-STAT 7, (LYTES, BLD GAS, ICA,H+H)
Acid-Base Excess: 4 mmol/L — ABNORMAL HIGH (ref 0.0–2.0)
Acid-Base Excess: 4 mmol/L — ABNORMAL HIGH (ref 0.0–2.0)
Acid-Base Excess: 5 mmol/L — ABNORMAL HIGH (ref 0.0–2.0)
Acid-Base Excess: 5 mmol/L — ABNORMAL HIGH (ref 0.0–2.0)
Bicarbonate: 29.3 mmol/L — ABNORMAL HIGH (ref 20.0–28.0)
Bicarbonate: 31.6 mmol/L — ABNORMAL HIGH (ref 20.0–28.0)
Bicarbonate: 32.9 mmol/L — ABNORMAL HIGH (ref 20.0–28.0)
Bicarbonate: 30.5 mmol/L — ABNORMAL HIGH (ref 20.0–28.0)
Calcium, Ion: 0.98 mmol/L — ABNORMAL LOW (ref 1.15–1.40)
Calcium, Ion: 1.19 mmol/L (ref 1.15–1.40)
Calcium, Ion: 1.18 mmol/L (ref 1.15–1.40)
Calcium, Ion: 1.2 mmol/L (ref 1.15–1.40)
HCT: 37 % (ref 36.0–46.0)
HCT: 39 % (ref 36.0–46.0)
HCT: 37 % (ref 36.0–46.0)
HCT: 37 % (ref 36.0–46.0)
Hemoglobin: 12.6 g/dL (ref 12.0–15.0)
Hemoglobin: 13.3 g/dL (ref 12.0–15.0)
Hemoglobin: 12.6 g/dL (ref 12.0–15.0)
Hemoglobin: 12.6 g/dL (ref 12.0–15.0)
O2 Saturation: 82 %
O2 Saturation: 94 %
O2 Saturation: 96 %
O2 Saturation: 91 %
Patient temperature: 98.2
Patient temperature: 100.6
Patient temperature: 38.6
Patient temperature: 37.3
Potassium: 5 mmol/L (ref 3.5–5.1)
Potassium: 4.9 mmol/L (ref 3.5–5.1)
Potassium: 5 mmol/L (ref 3.5–5.1)
Potassium: 4.6 mmol/L (ref 3.5–5.1)
Sodium: 135 mmol/L (ref 135–145)
Sodium: 135 mmol/L (ref 135–145)
Sodium: 137 mmol/L (ref 135–145)
Sodium: 138 mmol/L (ref 135–145)
TCO2: 31 mmol/L (ref 22–32)
TCO2: 33 mmol/L — ABNORMAL HIGH (ref 22–32)
TCO2: 35 mmol/L — ABNORMAL HIGH (ref 22–32)
TCO2: 32 mmol/L (ref 22–32)
pCO2 arterial: 47.9 mmHg (ref 32.0–48.0)
pCO2 arterial: 66 mmHg (ref 32.0–48.0)
pCO2 arterial: 70.6 mmHg (ref 32.0–48.0)
pCO2 arterial: 49.5 mmHg — ABNORMAL HIGH (ref 32.0–48.0)
pH, Arterial: 7.394 (ref 7.350–7.450)
pH, Arterial: 7.293 — ABNORMAL LOW (ref 7.350–7.450)
pH, Arterial: 7.284 — ABNORMAL LOW (ref 7.350–7.450)
pH, Arterial: 7.4 (ref 7.350–7.450)
pO2, Arterial: 47 mmHg — ABNORMAL LOW (ref 83.0–108.0)
pO2, Arterial: 85 mmHg (ref 83.0–108.0)
pO2, Arterial: 106 mmHg (ref 83.0–108.0)
pO2, Arterial: 64 mmHg — ABNORMAL LOW (ref 83.0–108.0)

## 2021-02-15 LAB — CBC
HCT: 37.1 % (ref 36.0–46.0)
Hemoglobin: 12.4 g/dL (ref 12.0–15.0)
MCH: 33.9 pg (ref 26.0–34.0)
MCHC: 33.4 g/dL (ref 30.0–36.0)
MCV: 101.4 fL — ABNORMAL HIGH (ref 80.0–100.0)
Platelets: 109 10*3/uL — ABNORMAL LOW (ref 150–400)
RBC: 3.66 MIL/uL — ABNORMAL LOW (ref 3.87–5.11)
RDW: 13.4 % (ref 11.5–15.5)
WBC: 9.3 10*3/uL (ref 4.0–10.5)
nRBC: 0.2 % (ref 0.0–0.2)

## 2021-02-15 LAB — BASIC METABOLIC PANEL
Anion gap: 8 (ref 5–15)
BUN: 54 mg/dL — ABNORMAL HIGH (ref 6–20)
CO2: 28 mmol/L (ref 22–32)
Calcium: 7.6 mg/dL — ABNORMAL LOW (ref 8.9–10.3)
Chloride: 99 mmol/L (ref 98–111)
Creatinine, Ser: 1.87 mg/dL — ABNORMAL HIGH (ref 0.44–1.00)
GFR, Estimated: 32 mL/min — ABNORMAL LOW (ref >60)
Glucose, Bld: 156 mg/dL — ABNORMAL HIGH (ref 70–99)
Potassium: 4.7 mmol/L (ref 3.5–5.1)
Sodium: 135 mmol/L (ref 135–145)

## 2021-02-15 LAB — MAGNESIUM
Magnesium: 1.7 mg/dL (ref 1.7–2.4)
Magnesium: 1.9 mg/dL (ref 1.7–2.4)

## 2021-02-15 LAB — STREP PNEUMONIAE URINARY ANTIGEN: Strep Pneumo Urinary Antigen: POSITIVE — AB

## 2021-02-15 LAB — GLUCOSE, CAPILLARY
Glucose-Capillary: 128 mg/dL — ABNORMAL HIGH (ref 70–99)
Glucose-Capillary: 153 mg/dL — ABNORMAL HIGH (ref 70–99)
Glucose-Capillary: 137 mg/dL — ABNORMAL HIGH (ref 70–99)
Glucose-Capillary: 136 mg/dL — ABNORMAL HIGH (ref 70–99)
Glucose-Capillary: 132 mg/dL — ABNORMAL HIGH (ref 70–99)
Glucose-Capillary: 136 mg/dL — ABNORMAL HIGH (ref 70–99)

## 2021-02-15 LAB — PHOSPHORUS
Phosphorus: 3.8 mg/dL (ref 2.5–4.6)
Phosphorus: 3.1 mg/dL (ref 2.5–4.6)

## 2021-02-15 LAB — PROCALCITONIN: Procalcitonin: 17.32 ng/mL

## 2021-02-15 MED ORDER — SODIUM CHLORIDE 3 % IN NEBU
4.0000 mL | INHALATION_SOLUTION | Freq: Two times a day (BID) | RESPIRATORY_TRACT | Status: AC
  Administered 2021-02-15 – 2021-02-17 (×6): 4 mL via RESPIRATORY_TRACT
  Filled 2021-02-15 (×6): qty 4

## 2021-02-15 MED ORDER — PROPOFOL 1000 MG/100ML IV EMUL
5.0000 ug/kg/min | INTRAVENOUS | Status: DC
  Administered 2021-02-15: 20 ug/kg/min via INTRAVENOUS
  Administered 2021-02-15: 10 ug/kg/min via INTRAVENOUS
  Administered 2021-02-15 – 2021-02-16 (×2): 30 ug/kg/min via INTRAVENOUS
  Administered 2021-02-16: 40 ug/kg/min via INTRAVENOUS
  Administered 2021-02-16: 30 ug/kg/min via INTRAVENOUS
  Administered 2021-02-16: 40 ug/kg/min via INTRAVENOUS
  Administered 2021-02-16: 30 ug/kg/min via INTRAVENOUS
  Administered 2021-02-16 – 2021-02-17 (×4): 40 ug/kg/min via INTRAVENOUS
  Filled 2021-02-15 (×12): qty 100

## 2021-02-15 MED ORDER — GUAIFENESIN 100 MG/5ML PO LIQD
10.0000 mL | Freq: Four times a day (QID) | ORAL | Status: DC
  Administered 2021-02-15 – 2021-02-18 (×11): 10 mL
  Filled 2021-02-15 (×10): qty 10

## 2021-02-15 NOTE — Progress Notes (Signed)
NAME:  [name], MRN:  [id number], DOB:  1970/09/11, LOS: 1 ADMISSION DATE:  02/14/2021, CONSULTATION DATE:  2/3 REFERRING MD:  wert  CHIEF COMPLAINT:  respiratory failure   History of Present Illness:  51 year old female found unresponsive the am of 2/3. For few Days prior family reporting GI illness w/ vomiting & diarrhea. Room air sats 80s, only arouses  to sternal rub. Dried emesis on face.  ER eval LA 2.7, K 5.5 creatine 2.58, got narcan w/ some improvement in mental status. CXR showed RLL airspace disease. Abx started. ABG obtained: 7.23/74/92/24 intubated.  [facility].   Pertinent  Medical History  Factor V Leiden heterozygous mutation, essential HTN, chronic laryngitis, anxiety w/ panic attacks, chronic pain, prior esophagitis, scoliosis   Significant Hospital Events: Including procedures, antibiotic start and stop dates in addition to other pertinent events   2/3 admitted w/ acute resp failure 2/2 aspiration PNA and sepsis. IV Levaquin (anaphylaxis reported to PCN). Intubated in ER  PICC line placed, started on NE gtt for soft BP  Interim History / Subjective:  Started on NE overnight. RR increased. Intubated and sedated on my exam. NE has been weaned.   Objective   Blood pressure 110/74, pulse 83, temperature (!) 100.6 F (38.1 C), resp. rate 18, height 5\' 2"  (1.575 m), weight 109.2 kg, SpO2 97 %.    Vent Mode: PRVC FiO2 (%):  [60 %-100 %] 80 % Set Rate:  [20 bmp-30 bmp] 30 bmp Vt Set:  [400 mL] 400 mL PEEP:  [5 cmH20-8 cmH20] 8 cmH20 Plateau Pressure:  [13 cmH20-22 cmH20] 17 cmH20   Intake/Output Summary (Last 24 hours) at 02/15/2021 [id number] Last data filed at 02/15/2021 [id number] Gross per 24 hour  Intake 4504.2 ml  Output 1385 ml  Net 3119.2 ml    Filed Weights   02/14/21 0734 02/14/21 1400 02/15/21 0329  Weight: 89.7 kg 107.5 kg 109.2 kg   Noted vitals: febrile , normotensive  Examination: General: obese 51 year old female on vent, with arm  restraints,sedated   HENT: Orally intubated. OGT in place, pinpoint pupils  Lungs: bilateral mechanical breath sounds. breath sounds. Cardiovascular: RRR, no mrg Abdomen: soft, non distended  Neuro: sedated   Strep pneumon urine antigen: positive Legionella: pending Procal: Pending WBC 12>9.3 Cr 2.68>2.19>1.87 Mg 1.7  Resolved Hospital Problem list     Assessment & Plan:  Principal Problem:   Respiratory failure with hypoxia and hypercapnia (HCC) Active Problems:   Depression with anxiety   Chronic pain syndrome   Factor V Leiden mutation (HCC)   Aspiration pneumonia of right lower lobe due to vomit (HCC)   Sepsis with acute organ dysfunction (HCC)   Toxic metabolic encephalopathy   AKI (acute kidney injury) ([place])   Alteration in electrolyte and fluid balance   Lactic acidosis   Elevated LFTs   H/O gastroesophageal reflux (GERD)   Hyperlipidemia  Acute hypoxic and hypercarbic respiratory failure 2/2 strep pneumonia  Respiratory culture with multiple gram positive rods, strep urine antigen +.  -MRSA PCR neg - AM chest xray personally reviewed, rml opacity worse than prior day, bilateral pleural effusions.  P: - Continue Levaquin  - Follow respiratory cultures  -VAP bundle  -PAD protocol RASS goal -2 -PCT protocol   Sepsis w/ organ dysfxn due to aspiration PNA +/- possible GI infection  -initially with lactic acidosis, trended down with 30ml/kg and abx P:  Abx as above Ck GI pathogens PCR, pending collection  AKI   Baseline Cr .9 -1, 2.68 on admission > 1.87 with IVF. Expect ATN  P: - Renal dose meds  - Trend kidney function  - continue to hold home Celebrex, HCTZ,  ace-I - foley, strict I/o's  Acute toxic and metabolic encephalopathy superimposed on h/o depression/anxiety and chronic pain syndrome -responded to narcan , Unintentional overdose superimposed on infection?  P: - PAD protocol fent and precedex  - Treat sepsis and resp failure as above -  Cont folic acid and  Celexa  - Cont Elavil 75mg  q hs VT - Cont Trintellix 10mg /d - continue 0.5 clonazepam for xanax - Hold Lyrica for now (was on 200 mg tid) w/ AKI - Hold adderall - Hold Robaxin    Elevated LFTs Likely 2/2 sepsis  P: -Treat sepsis as above  - Hold statin  - Repeat LFTs 2/5  GERD w/ h/o gastritis  P: PPI and cont sucralfate   Factor V Leiden mutation P: [place] LMWH   Best Practice (right click and "Reselect all SmartList Selections" daily)   Diet/type: tubefeeds DVT prophylaxis: LMWH GI prophylaxis: PPI Lines: N/A Foley:  Yes, and it is still needed Code Status:  full code Last date of multidisciplinary goals of care discussion [pending ]

## 2021-02-15 NOTE — Progress Notes (Signed)
eLink Physician-Brief Progress Note Patient Name: [name] DOB: [birth year] MRN: [id number]   Date of Service  02/15/2021  HPI/Events of Note  Patient needs restraints renewed to prevent self-extubation.  eICU Interventions  Restraints order renewed.        Thomasene Lot Glennette Galster 02/15/2021, 8:12 PM

## 2021-02-15 NOTE — Progress Notes (Signed)
[facility] P[facility]M  Diminished breath sounds R>L Bronchscopy planned  Heber Belleair Shore, MD [place] P[facility]M Pager: [phone] Cell: [phone] After 7:00 pm call Elink  [phone]

## 2021-02-15 NOTE — Progress Notes (Signed)
Critical ABG results given to Dr Lake Bells at 0835 pH- 7.28 pCO2- 70.6 pO2-106 HCO3- 32.9

## 2021-02-15 NOTE — Procedures (Signed)
Bedside Bronchoscopy Procedure Note [name] [id number] 1970/03/21  Procedure: Bronchoscopy Indications: Obtain specimens for culture and/or other diagnostic studies and Remove secretions  Procedure Details: ET Tube Size: 7.5 ET Tube secured at lip (cm): 24 Bite block in place: No In preparation for procedure, Patient hyper-oxygenated with 100 % FiO2 Airway entered and the following bronchi were examined: RUL, RML, RLL, LUL, LLL, and Bronchi.   Bronchoscope removed.  Patient remained on 100% FiO2 throughout procedure.    Evaluation BP (!) 113/92    Pulse (!) 59    Temp 99.9 F (37.7 C)    Resp (!) 24    Ht 5\' 2"  (1.575 m)    Wt 109.2 kg    SpO2 99%    BMI 44.03 kg/m  Breath Sounds:Diminished and Rhonch O2 sats: stable throughout Patient's Current Condition: stable Specimens:  Sent  Complications: No apparent complications Patient did tolerate procedure well.   02/15/2021, 11:22 AM

## 2021-02-15 NOTE — Progress Notes (Signed)
Pt not tolerating vent, RT at bedside, called Dr. Kendrick Fries to bedside and new orders received for propofol, changed vent setting to [facility], will prepared for bronch.

## 2021-02-15 NOTE — Progress Notes (Signed)
eLink Physician-Brief Progress Note Patient Name: [name] DOB: May 26, 1970 MRN: [id number]   Date of Service  02/15/2021  HPI/Events of Note  ABG reviewed.  eICU Interventions  Respiratory rate increased to 30. Repeat ABG ordered for 8 AM.        Marnette Burgess U Vasti Yagi 02/15/2021, 5:01 AM

## 2021-02-15 NOTE — Progress Notes (Signed)
[facility] PCCM  Thick mucus/food particulate noted in right lower lobe partially occluding one subsegment Could be completely removed with suctioning Will give hypertonic saline, as for bag lavage from RT, and add guaifenesin overnight Plan repeat bronchoscopy in AM If no improvement then may need to consider basket retrieval or cryo  [name], MD [place] PCCM Pager: [phone] Cell: [phone] After 7:00 pm call Elink  [phone]

## 2021-02-15 NOTE — Procedures (Signed)
Bronchoscopy Procedure Note  [name]  [id number]  1970-05-27  Date:02/15/21  Time:11:14 AM   Provider Performing:Brent Vernisha Bacote   Procedure(s):  Flexible Bronchoscopy [phone])  Indication(s) Mucus plugging, respiratory failure  Consent Unable to obtain consent due to emergent nature of procedure.  Anesthesia Propofol, fentanyl versed in ICU setting on mechanical ventialtion   Time Out Verified patient identification, verified procedure, site/side was marked, verified correct patient position, special equipment/implants available, medications/allergies/relevant history reviewed, required imaging and test results available.   Sterile Technique Usual hand hygiene, masks, gowns, and gloves were used   Procedure Description Bronchoscope advanced through endotracheal tube and into airway.  Airways were examined down to subsegmental level with findings noted below.   Following diagnostic evaluation, suctioning from the Right mainstem and RLL> 10cc pus obtained.    Findings: acute inflammation throughout both lungs, severe tracheobronchomalacia, thick mucus throughout both lungs, right lower lobe foreign body which could not be removed with suctioning   Complications/Tolerance None; patient tolerated the procedure well. Chest X-ray is needed post procedure.   EBL Minimal   Specimen(s) None  [name], MD [place] PCCM Pager: [phone] Cell: [phone] After 7:00 pm call Elink  [phone]

## 2021-02-16 DIAGNOSIS — J9601 Acute respiratory failure with hypoxia: Secondary | ICD-10-CM | POA: Diagnosis not present

## 2021-02-16 DIAGNOSIS — J9602 Acute respiratory failure with hypercapnia: Secondary | ICD-10-CM | POA: Diagnosis not present

## 2021-02-16 LAB — COMPREHENSIVE METABOLIC PANEL
ALT: 89 U/L — ABNORMAL HIGH (ref 0–44)
AST: 134 U/L — ABNORMAL HIGH (ref 15–41)
Albumin: 1.9 g/dL — ABNORMAL LOW (ref 3.5–5.0)
Alkaline Phosphatase: 146 U/L — ABNORMAL HIGH (ref 38–126)
Anion gap: 7 (ref 5–15)
BUN: 34 mg/dL — ABNORMAL HIGH (ref 6–20)
CO2: 31 mmol/L (ref 22–32)
Calcium: 8 mg/dL — ABNORMAL LOW (ref 8.9–10.3)
Chloride: 99 mmol/L (ref 98–111)
Creatinine, Ser: 1.12 mg/dL — ABNORMAL HIGH (ref 0.44–1.00)
GFR, Estimated: 60 mL/min — ABNORMAL LOW (ref >60)
Glucose, Bld: 135 mg/dL — ABNORMAL HIGH (ref 70–99)
Potassium: 4.6 mmol/L (ref 3.5–5.1)
Sodium: 137 mmol/L (ref 135–145)
Total Bilirubin: 0.9 mg/dL (ref 0.3–1.2)
Total Protein: 5.1 g/dL — ABNORMAL LOW (ref 6.5–8.1)

## 2021-02-16 LAB — GLUCOSE, CAPILLARY
Glucose-Capillary: 133 mg/dL — ABNORMAL HIGH (ref 70–99)
Glucose-Capillary: 127 mg/dL — ABNORMAL HIGH (ref 70–99)
Glucose-Capillary: 116 mg/dL — ABNORMAL HIGH (ref 70–99)
Glucose-Capillary: 137 mg/dL — ABNORMAL HIGH (ref 70–99)
Glucose-Capillary: 138 mg/dL — ABNORMAL HIGH (ref 70–99)

## 2021-02-16 LAB — CULTURE, RESPIRATORY W GRAM STAIN

## 2021-02-16 LAB — CBC WITH DIFFERENTIAL/PLATELET
Abs Immature Granulocytes: 0.18 10*3/uL — ABNORMAL HIGH (ref 0.00–0.07)
Basophils Absolute: 0 10*3/uL (ref 0.0–0.1)
Basophils Relative: 0 %
Eosinophils Absolute: 0 10*3/uL (ref 0.0–0.5)
Eosinophils Relative: 0 %
HCT: 36.4 % (ref 36.0–46.0)
Hemoglobin: 12.6 g/dL (ref 12.0–15.0)
Immature Granulocytes: 3 %
Lymphocytes Relative: 11 %
Lymphs Abs: 0.8 10*3/uL (ref 0.7–4.0)
MCH: 35.4 pg — ABNORMAL HIGH (ref 26.0–34.0)
MCHC: 34.6 g/dL (ref 30.0–36.0)
MCV: 102.2 fL — ABNORMAL HIGH (ref 80.0–100.0)
Monocytes Absolute: 0.5 10*3/uL (ref 0.1–1.0)
Monocytes Relative: 7 %
Neutro Abs: 5.5 10*3/uL (ref 1.7–7.7)
Neutrophils Relative %: 79 %
Platelets: 87 10*3/uL — ABNORMAL LOW (ref 150–400)
RBC: 3.56 MIL/uL — ABNORMAL LOW (ref 3.87–5.11)
RDW: 13.5 % (ref 11.5–15.5)
WBC: 7.1 10*3/uL (ref 4.0–10.5)
nRBC: 0.3 % — ABNORMAL HIGH (ref 0.0–0.2)

## 2021-02-16 LAB — PROCALCITONIN: Procalcitonin: 7.41 ng/mL

## 2021-02-16 LAB — TRIGLYCERIDES
Triglycerides: 1204 mg/dL — ABNORMAL HIGH (ref <150)
Triglycerides: 423 mg/dL — ABNORMAL HIGH (ref <150)

## 2021-02-16 MED ORDER — LEVOFLOXACIN IN D5W 750 MG/150ML IV SOLN
750.0000 mg | INTRAVENOUS | Status: AC
  Administered 2021-02-16 – 2021-02-20 (×5): 750 mg via INTRAVENOUS
  Filled 2021-02-16 (×5): qty 150

## 2021-02-16 MED ORDER — MIDAZOLAM HCL 2 MG/2ML IJ SOLN
4.0000 mg | Freq: Once | INTRAMUSCULAR | Status: AC
  Administered 2021-02-16: 4 mg via INTRAVENOUS
  Filled 2021-02-16: qty 4

## 2021-02-16 MED ORDER — AMPHETAMINE-DEXTROAMPHETAMINE 20 MG PO TABS: 20.0000 mg | ORAL_TABLET | Freq: Every day | ORAL | Status: DC

## 2021-02-16 MED ORDER — PANTOPRAZOLE 2 MG/ML SUSPENSION
40.0000 mg | Freq: Every day | ORAL | Status: DC
  Administered 2021-02-16 – 2021-02-17 (×2): 40 mg
  Filled 2021-02-16 (×3): qty 20

## 2021-02-16 MED ORDER — AMPHETAMINE-DEXTROAMPHETAMINE 10 MG PO TABS
20.0000 mg | ORAL_TABLET | Freq: Every day | ORAL | Status: DC
  Administered 2021-02-16 – 2021-02-18 (×3): 20 mg
  Filled 2021-02-16 (×3): qty 2

## 2021-02-16 NOTE — Procedures (Addendum)
Bronchoscopy Procedure Note  [name]  [id number]  [birth year]  Date:02/16/21  Time:3:42 PM   Provider Performing:Brent Damauri Minion   Procedure(s):  Flexible Bronchoscopy [phone])  Indication(s) Right lower lobe foreign body  Consent Risks of the procedure as well as the alternatives and risks of each were explained to the patient and/or caregiver.  Consent for the procedure was obtained and is signed in the bedside chart  Anesthesia Propofol infusion, fentanyl infusion   Time Out Verified patient identification, verified procedure, site/side was marked, verified correct patient position, special equipment/implants available, medications/allergies/relevant history reviewed, required imaging and test results available.   Sterile Technique Usual hand hygiene, masks, gowns, and gloves were used   Procedure Description Bronchoscope advanced through endotracheal tube and into airway.  Airways were examined down to subsegmental level with findings noted below.   Following diagnostic evaluation, no procedures were performed.    Findings: The trachea was patent until the patient started coughing then she had significant dynamic collapse due to tracheobronchomalacia.  She had thick secretions in both lungs which required suctioning.  The right lower lobe lateral segment was occluded by a foreign body which appears organic, likely food.  This could not be removed by suctioning.      Complications/Tolerance None; patient tolerated the procedure well. Chest X-ray is not needed post procedure.   EBL Minimal   Specimen(s) None  Plan: Need to arrange for bronchoscopy with cryo and retrieval basket, non-urgent as her need for mechanical ventilation is explained by her pneumonia and not solely this foreign body.  Will make arrangements for this in AM.  [name], MD [place] PCCM Pager: [phone] Cell: [phone] After 7:00 pm call Elink  [phone]

## 2021-02-16 NOTE — Progress Notes (Signed)
PHARMACY NOTE:  ANTIMICROBIAL RENAL DOSAGE ADJUSTMENT  Current antimicrobial regimen includes a mismatch between antimicrobial dosage and estimated renal function.  As per policy approved by the Pharmacy & Therapeutics and Medical Executive Committees, the antimicrobial dosage will be adjusted accordingly.  Current antimicrobial dosage:  Levaquin 750 mg IV every 48 hours  Indication: CAP from strep pneumo  Renal Function:  Estimated Creatinine Clearance: 70 mL/min (A) (by C-G formula based on SCr of 1.12 mg/dL (H)).    Antimicrobial dosage has been changed to:  Levaquin 750 mg IV every 24 hours given Scr trending down to 1.12 on recheck today, CrCl now >50 ml/min   [name], PharmD, BCPS Please check AMION for all [facility] contact numbers Clinical Pharmacist 02/16/2021 7:34 AM

## 2021-02-16 NOTE — Progress Notes (Signed)
NAME:  [name], MRN:  [id number], DOB:  1970-04-14, LOS: 2 ADMISSION DATE:  02/14/2021, CONSULTATION DATE:  2/3 REFERRING MD: Melvyn Novas CHIEF COMPLAINT:  respiratory failure   History of Present Illness:  51 year old female found unresponsive the am of 2/3. For few Days prior family reporting GI illness w/ vomiting & diarrhea. Room air sats 80s, only arouses  to sternal rub. Dried emesis on face.  ER eval LA 2.7, K 5.5 creatine 2.58, got narcan w/ some improvement in mental status. CXR showed RLL airspace disease. Abx started. ABG obtained: 7.23/74/92/24 intubated.  [facility].   Pertinent  Medical History  Factor V Leiden heterozygous mutation, essential HTN, chronic laryngitis, anxiety w/ panic attacks, chronic pain, prior esophagitis, scoliosis   Significant Hospital Events: Including procedures, antibiotic start and stop dates in addition to other pertinent events   2/3 admitted w/ acute resp failure 2/2 aspiration PNA and sepsis. IV Levaquin (anaphylaxis reported to PCN). Intubated in ER  2/4 PICC line placed, started on/off NE gtt for soft BP. + Strep pneumo urine antigen. Bronch thick mucus/food  Interim History / Subjective:  Patient intubated . Opens eyes on exam , calm.   Objective   Blood pressure 117/66, pulse 85, temperature 100 F (37.8 C), resp. rate (!) 22, height _0  (1.575 m), weight 109.4 kg, SpO2 96 %.    Vent Mode: PCV FiO2 (%):  [60 %-70 %] 65 % Set Rate:  [30 bmp] 30 bmp Vt Set:  [400 mL] 400 mL PEEP:  [8 cmH20] 8 cmH20 Plateau Pressure:  [21 cmH20-24 cmH20] 24 cmH20   Intake/Output Summary (Last 24 hours) at 02/16/2021 0523 Last data filed at 02/16/2021 0518 Gross per 24 hour  Intake 2820.22 ml  Output 2270 ml  Net 550.22 ml    Filed Weights   02/14/21 1400 02/15/21 0329 02/16/21 0500  Weight: 107.5 kg 109.2 kg 109.4 kg   Examination: General:  In bed on vent HENT: NCAT ETT in place PULM: vent supported , wheezing in all lung  fields  CV: RRR, no mgr GI: BS+, soft, nontender MSK: normal bulk and tone Neuro: mildly sedated on vent , opens eyes    Procalcitonin 28 > 17.32 > 7.41 Creatinine 1.87 > 1.12, BUN 54 > 34 CO2 31 Corrected calcium 9.7 AST 134, ALT 89, alk phos 146, bilirubin 0.9 Triglycerides 1, 204  Blood culture 2/03: NGTD 24 hours Respiratory culture 2/03, sputum: Abundant strep pneumo Respiratory culture, BAL 2/04: Pending  Resolved Hospital Problem list   Sepsis  Assessment & Plan:  Principal Problem:   Respiratory failure with hypoxia and hypercapnia (HCC) Active Problems:   Depression with anxiety   Chronic pain syndrome   Factor V Leiden mutation ([place])   Aspiration pneumonia of right lower lobe due to vomit ([place])   Sepsis with acute organ dysfunction (HCC)   Toxic metabolic encephalopathy   AKI (acute kidney injury) ([place])   Alteration in electrolyte and fluid balance   Lactic acidosis   Elevated LFTs   H/O gastroesophageal reflux (GERD)   Hyperlipidemia   Acute respiratory failure with hypercapnia (HCC)  Acute hypoxic and hypercapnic respiratory failure secondary to strep pneumo and aspiration pneumonia -Strep pneumo on sputum culture and strep pneumo urine antigen positive.  Penicillin allergy, improving on Levaquin. P: - Continue Levaquin  - Continue to follow cultures and susceptibilities -VAP bundle - PAD protocol, RASS goal -1 to -2  Sepsis associated acute kidney injury Baseline Cr .  9 -1.  Improving with supportive care P: - Renal dose meds  - Trend kidney function  - continue to hold home Celebrex, HCTZ,  ace-I - foley, strict I/o's  Major depressive disorder with anxiety Chronic pain syndrome.   Reported to have responded to Narcan which question whether patient had unintentional overdose.  Review of PDMP does not show opioid on file. 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 Low albumin suggest chronic problem. Hepatic steatosis on CT abdomen 2022.  P: -Treat sepsis as above  - Hold statin  - Trend hepatic function  GERD w/ h/o gastritis  P: PPI and cont sucralfate   Thrombocytopenia Platelets 132>109>87 P: Discuss holding Lovenox for platelet count < 100K Best Practice (right click and "Reselect all SmartList Selections" daily)   Diet/type: tubefeeds DVT prophylaxis: LMWH GI prophylaxis: PPI Lines: N/A Foley:  Yes, and it is still needed Code Status:  full code Last date of multidisciplinary goals of care discussion [pending ]

## 2021-02-16 NOTE — Progress Notes (Signed)
Patient's triglyceride level drawn @ 0315 resulted 1,204 was collected while Propofol gtt running. A repeat triglyceride level was drawn @0529  with the Propofol gtt paused and resulted 423.

## 2021-02-16 NOTE — Procedures (Signed)
Bedside Bronchoscopy Procedure Note YEHUDIS MONCEAUX [id number] 1970/07/30  Procedure: Bronchoscopy Indications: Diagnostic evaluation of the airways and Remove secretions  Procedure Details: ET Tube Size: 7.5 ET Tube secured at lip (cm): 23 Bite block in place: No In preparation for procedure, Patient hyper-oxygenated with 100 % FiO2 Airway entered and the following bronchi were examined: RUL, RML, RLL, LUL, LLL, and Bronchi.   Bronchoscope removed. Patient remained on 100% FiO2 throughout procedure.    Evaluation BP 109/66 (BP Location: Left Wrist)    Pulse 84    Temp 99.9 F (37.7 C) (Bladder)    Resp (!) 24    Ht 5\' 2"  (1.575 m)    Wt 109.4 kg    SpO2 96%    BMI 44.11 kg/m  Breath Sounds:Diminished and Rhonch O2 sats: stable throughout Patient's Current Condition: stable Specimens:  None Complications: No apparent complications Patient did tolerate procedure well.   02/16/2021, 10:36 AM

## 2021-02-17 ENCOUNTER — Inpatient Hospital Stay (HOSPITAL_COMMUNITY): Payer: Medicaid Other

## 2021-02-17 ENCOUNTER — Encounter (HOSPITAL_COMMUNITY)
Admission: EM | Discharge status: Against Medical Advice | Payer: Self-pay | Source: Home / Self Care | Attending: Pulmonary Disease

## 2021-02-17 ENCOUNTER — Encounter (HOSPITAL_COMMUNITY): Payer: Self-pay | Admitting: Pulmonary Disease

## 2021-02-17 DIAGNOSIS — A419 Sepsis, unspecified organism: Secondary | ICD-10-CM | POA: Diagnosis not present

## 2021-02-17 DIAGNOSIS — J690 Pneumonitis due to inhalation of food and vomit: Secondary | ICD-10-CM | POA: Diagnosis not present

## 2021-02-17 DIAGNOSIS — J9601 Acute respiratory failure with hypoxia: Secondary | ICD-10-CM | POA: Diagnosis not present

## 2021-02-17 DIAGNOSIS — N179 Acute kidney failure, unspecified: Secondary | ICD-10-CM | POA: Diagnosis not present

## 2021-02-17 DIAGNOSIS — J9602 Acute respiratory failure with hypercapnia: Secondary | ICD-10-CM | POA: Diagnosis not present

## 2021-02-17 HISTORY — PX: VIDEO BRONCHOSCOPY: SHX5072

## 2021-02-17 HISTORY — PX: CRYOTHERAPY: SHX6894

## 2021-02-17 LAB — LEGIONELLA PNEUMOPHILA SEROGP 1 UR AG: L. pneumophila Serogp 1 Ur Ag: NEGATIVE

## 2021-02-17 LAB — COMPREHENSIVE METABOLIC PANEL
ALT: 84 U/L — ABNORMAL HIGH (ref 0–44)
AST: 99 U/L — ABNORMAL HIGH (ref 15–41)
Albumin: 1.9 g/dL — ABNORMAL LOW (ref 3.5–5.0)
Alkaline Phosphatase: 179 U/L — ABNORMAL HIGH (ref 38–126)
Anion gap: 8 (ref 5–15)
BUN: 18 mg/dL (ref 6–20)
CO2: 32 mmol/L (ref 22–32)
Calcium: 8.1 mg/dL — ABNORMAL LOW (ref 8.9–10.3)
Chloride: 98 mmol/L (ref 98–111)
Creatinine, Ser: 0.83 mg/dL (ref 0.44–1.00)
GFR, Estimated: >60 mL/min (ref >60)
Glucose, Bld: 142 mg/dL — ABNORMAL HIGH (ref 70–99)
Potassium: 4.8 mmol/L (ref 3.5–5.1)
Sodium: 138 mmol/L (ref 135–145)
Total Bilirubin: 0.9 mg/dL (ref 0.3–1.2)
Total Protein: 5.4 g/dL — ABNORMAL LOW (ref 6.5–8.1)

## 2021-02-17 LAB — CBC WITH DIFFERENTIAL/PLATELET
Abs Immature Granulocytes: 0.2 10*3/uL — ABNORMAL HIGH (ref 0.00–0.07)
Basophils Absolute: 0 10*3/uL (ref 0.0–0.1)
Basophils Relative: 0 %
Eosinophils Absolute: 0 10*3/uL (ref 0.0–0.5)
Eosinophils Relative: 0 %
HCT: 39.1 % (ref 36.0–46.0)
Hemoglobin: 12.9 g/dL (ref 12.0–15.0)
Lymphocytes Relative: 17 %
Lymphs Abs: 1.2 10*3/uL (ref 0.7–4.0)
MCH: 33.9 pg (ref 26.0–34.0)
MCHC: 33 g/dL (ref 30.0–36.0)
MCV: 102.9 fL — ABNORMAL HIGH (ref 80.0–100.0)
Metamyelocytes Relative: 2 %
Monocytes Absolute: 0.7 10*3/uL (ref 0.1–1.0)
Monocytes Relative: 11 %
Myelocytes: 1 %
Neutro Abs: 4.7 10*3/uL (ref 1.7–7.7)
Neutrophils Relative %: 69 %
Platelets: 84 10*3/uL — ABNORMAL LOW (ref 150–400)
RBC: 3.8 MIL/uL — ABNORMAL LOW (ref 3.87–5.11)
RDW: 13.7 % (ref 11.5–15.5)
WBC: 6.8 10*3/uL (ref 4.0–10.5)
nRBC: 0 % (ref 0.0–0.2)
nRBC: 1 /100 WBC — ABNORMAL HIGH

## 2021-02-17 LAB — GLUCOSE, CAPILLARY
Glucose-Capillary: 113 mg/dL — ABNORMAL HIGH (ref 70–99)
Glucose-Capillary: 148 mg/dL — ABNORMAL HIGH (ref 70–99)
Glucose-Capillary: 127 mg/dL — ABNORMAL HIGH (ref 70–99)
Glucose-Capillary: 110 mg/dL — ABNORMAL HIGH (ref 70–99)
Glucose-Capillary: 105 mg/dL — ABNORMAL HIGH (ref 70–99)
Glucose-Capillary: 119 mg/dL — ABNORMAL HIGH (ref 70–99)
Glucose-Capillary: 113 mg/dL — ABNORMAL HIGH (ref 70–99)

## 2021-02-17 LAB — TRIGLYCERIDES: Triglycerides: 564 mg/dL — ABNORMAL HIGH (ref <150)

## 2021-02-17 SURGERY — VIDEO BRONCHOSCOPY WITHOUT FLUORO
Anesthesia: Moderate Sedation | Laterality: Right

## 2021-02-17 MED ORDER — ROCURONIUM BROMIDE 10 MG/ML (PF) SYRINGE: 50.0000 mg | PREFILLED_SYRINGE | Freq: Once | INTRAVENOUS | Status: AC

## 2021-02-17 MED ORDER — ETOMIDATE 2 MG/ML IV SOLN
INTRAVENOUS | Status: AC
  Administered 2021-02-17: 20 mg via INTRAVENOUS
  Filled 2021-02-17: qty 10

## 2021-02-17 MED ORDER — SENNOSIDES-DOCUSATE SODIUM 8.6-50 MG PO TABS
1.0000 | ORAL_TABLET | Freq: Two times a day (BID) | ORAL | Status: DC
  Administered 2021-02-17: 1
  Filled 2021-02-17: qty 1

## 2021-02-17 MED ORDER — ROCURONIUM BROMIDE 10 MG/ML (PF) SYRINGE
PREFILLED_SYRINGE | INTRAVENOUS | Status: AC
  Administered 2021-02-17: 50 mg via INTRAVENOUS
  Filled 2021-02-17: qty 10

## 2021-02-17 MED ORDER — EPINEPHRINE 1 MG/10ML IJ SOSY
PREFILLED_SYRINGE | INTRAMUSCULAR | Status: AC
  Filled 2021-02-17: qty 10

## 2021-02-17 MED ORDER — MIDAZOLAM HCL (PF) 5 MG/ML IJ SOLN
INTRAMUSCULAR | Status: AC
  Filled 2021-02-17: qty 2

## 2021-02-17 MED ORDER — POLYETHYLENE GLYCOL 3350 17 G PO PACK
17.0000 g | PACK | Freq: Every day | ORAL | Status: DC
  Administered 2021-02-17: 17 g
  Filled 2021-02-17: qty 1

## 2021-02-17 MED ORDER — DEXMEDETOMIDINE HCL IN NACL 400 MCG/100ML IV SOLN
0.0000 ug/kg/h | INTRAVENOUS | Status: DC
  Administered 2021-02-17: 0.7 ug/kg/h via INTRAVENOUS
  Administered 2021-02-17 – 2021-02-18 (×2): 0.8 ug/kg/h via INTRAVENOUS
  Filled 2021-02-17 (×2): qty 100

## 2021-02-17 MED ORDER — LACTATED RINGERS IV BOLUS
1000.0000 mL | Freq: Once | INTRAVENOUS | Status: AC
  Administered 2021-02-17: 1000 mL via INTRAVENOUS

## 2021-02-17 MED ORDER — ETOMIDATE 2 MG/ML IV SOLN: 20.0000 mg | Freq: Once | INTRAVENOUS | Status: AC

## 2021-02-17 NOTE — Progress Notes (Signed)
Called by RN to pt bedside. Pt self extubated. Pt currently able to speak name, give a good cough and faint stridor noted. Dr. Merrily Pew at pt bedside. Pt titrated from 7L nasal cannula to 10L salter High Flow Nasal Cannula. RT will continue to monitor and be available as needed.

## 2021-02-17 NOTE — Progress Notes (Signed)
Propofol infusion was switched to Precedex infusion with fentanyl, despite being maximum dose of Precedex at 1.2, patient became agitated and she self extubated, initially she was hypoxic, she was placed on nasal cannula oxygen at 7 L with improvement in oxygen saturation to 90%, will closely watch Holding off on reintubation for now

## 2021-02-17 NOTE — Procedures (Signed)
Bronchoscopy Procedure Note  [name]  [id number]  1970-05-11  Date:02/17/21  Time:9:28 AM   Provider Performing:Martavious Hartel C Tamala Julian   Procedure(s):  Initial Therapeutic Aspiration of Tracheobronchial Tree [phone])  Indication(s) Aspiration  Consent Risks of the procedure as well as the alternatives and risks of each were explained to the patient and/or caregiver.  Consent for the procedure was obtained and is signed in the bedside chart  Anesthesia In place from intubation  Time Out Verified patient identification, verified procedure, site/side was marked, verified correct patient position, special equipment/implants available, medications/allergies/relevant history reviewed, required imaging and test results available.   Sterile Technique Usual hand hygiene, masks, gowns, and gloves were used   Procedure Description Bronchoscope advanced through endotracheal tube and into airway.  Copious mucopurulent secretions found draping carina and extending down into right lower lobe.  There was an area of foreign debris in RLL lateral subsegment but less occlusive than previous.  Used combination aggressive washing and 1.7 cryoprobe to try to remove debris.  Partially successful then area began bleeding a bit so decided to abandon further efforts as this was in a tertiary bronchus if not a bronchiole.  Complications/Tolerance None; patient tolerated the procedure well. Chest X-ray is not needed post procedure.   EBL Minimal   Specimen(s) none

## 2021-02-17 NOTE — Procedures (Signed)
Intubation Procedure Note  GAYATRI TEASDALE  [id number]  1970/01/30  Date:02/17/21  Time:9:26 AM   Provider Performing:Brandelyn Henne C Katrinka Blazing    Procedure: Intubation (31500)  Indication(s) Respiratory Failure Need for therapeutic bronchoscopy  Consent Risks of the procedure as well as the alternatives and risks of each were explained to the patient and/or caregiver.  Consent for the procedure was obtained and is signed in the bedside chart   Anesthesia Etomidate, Versed, Fentanyl, and Rocuronium   Time Out Verified patient identification, verified procedure, site/side was marked, verified correct patient position, special equipment/implants available, medications/allergies/relevant history reviewed, required imaging and test results available.   Sterile Technique Usual hand hygeine, masks, and gloves were used   Procedure Description Patient positioned in bed supine.  Sedation given as noted above.  Bougee advanced through existing 7-5 tube and cuff deflated, 7-5 tube removed and 8-5 tube advanced over bougee without issue.   Complications/Tolerance None; patient tolerated the procedure well. Chest X-ray is ordered to verify placement.   EBL Minimal   Specimen(s) None

## 2021-02-17 NOTE — Progress Notes (Signed)
Pt ETT exchanged by Dr. Tamala Julian for bedside bronchoscopy. 7.5 ETT removed and an 8.5 ETT placed. A bougee was used to exchange the ETT. Placement was verified with bronchoscope by Dr. Tamala Julian. 8.5 ETT secured at 25cm at the lip. RT will continue to monitor and be available as needed.

## 2021-02-17 NOTE — Progress Notes (Signed)
Patient lavaged/bagged. Obtained copious amount of thick tan,mucous plugs secretions.

## 2021-02-17 NOTE — Progress Notes (Signed)
NAME:  [name], MRN:  [id number], DOB:  1970-02-26, LOS: 3 ADMISSION DATE:  02/14/2021, CONSULTATION DATE:  2/3 REFERRING MD: Melvyn Novas CHIEF COMPLAINT:  respiratory failure   History of Present Illness:  51 year old female found unresponsive the am of 2/3. For few Days prior family reporting GI illness w/ vomiting & diarrhea. Room air sats 80s, only arouses  to sternal rub. Dried emesis on face.  ER eval LA 2.7, K 5.5 creatine 2.58, got narcan w/ some improvement in mental status. CXR showed RLL airspace disease. Abx started. ABG obtained: 7.23/74/92/24 intubated.  [facility].   Pertinent  Medical History  Factor V Leiden heterozygous mutation, essential HTN, chronic laryngitis, anxiety w/ panic attacks, chronic pain, prior esophagitis, scoliosis   Significant Hospital Events: Including procedures, antibiotic start and stop dates in addition to other pertinent events   2/3 admitted w/ acute resp failure 2/2 aspiration PNA and sepsis. IV Levaquin (anaphylaxis reported to PCN). Intubated in ER  2/4 PICC line placed, started on/off NE gtt for soft BP. + Strep pneumo urine antigen. Bronch thick mucus/food, unable to remove foreign body 2/5: Repeat Bronch, right lower lobe lateral segment with foreign body, could not be removed with suction.   Interim History / Subjective:  Patient lavaged/bagged overnight, mucous plug removed.  Intubated and sedated this morning.  Objective   Blood pressure 111/63, pulse 87, temperature 99.7 F (37.6 C), resp. rate (!) 24, height _0  (1.575 m), weight 109.4 kg, SpO2 98 %.    Vent Mode: PCV FiO2 (%):  [60 %-65 %] 60 % Set Rate:  [30 bmp] 30 bmp PEEP:  [8 cmH20] 8 cmH20 Plateau Pressure:  [21 cmH20-24 cmH20] 21 cmH20   Intake/Output Summary (Last 24 hours) at 02/17/2021 0515 Last data filed at 02/17/2021 0400 Gross per 24 hour  Intake 2913.07 ml  Output 2300 ml  Net 613.07 ml      Filed Weights   02/14/21 1400 02/15/21 0329  02/16/21 0500  Weight: 107.5 kg 109.2 kg 109.4 kg   Examination: General:  In bed on vent HENT: NCAT ETT in place PULM: vent supported breathing, no wheezing CV: RRR, no mgr GI: Hypoactive BS, soft  MSK: normal bulk and tone Neuro: sedated on vent  On tube feeds No bowel movements 2.3L urine output  On prop, Fentanyl, Precedex  WBC 7.1 > 6.8 MCV 102.9, hemoglobin 12.9 Triglycerides 424 > 564   Blood culture 2/03: NGTD 2 days Respiratory culture 2/03, sputum: Abundant Streptococcus pneumoniae, sensitive to levofloxacin Respiratory culture, BAL 2/04: Gram-positive cocci, reincubated for better growth  Resolved Hospital Problem list   Sepsis AKI  Assessment & Plan:  Principal Problem:   Respiratory failure with hypoxia and hypercapnia (HCC) Active Problems:   Depression with anxiety   Chronic pain syndrome   Factor V Leiden mutation ([place])   Aspiration pneumonia of right lower lobe due to vomit ([place])   Sepsis with acute organ dysfunction (HCC)   Toxic metabolic encephalopathy   AKI (acute kidney injury) ([place])   Alteration in electrolyte and fluid balance   Lactic acidosis   Elevated LFTs   H/O gastroesophageal reflux (GERD)   Hyperlipidemia   Acute respiratory failure with hypercapnia (HCC)  Acute hypoxic and hypercapnic respiratory failure secondary to strep pneumo and aspiration pneumonia -Strep pneumo on sputum culture and strep pneumo urine antigen positive.   - Clinically improving on levofloxacin, day 4 P: - Continue Levaquin , pharmacy renally dosing, plan to  treat for 7 days, stop date 2/9 - VAP bundle - PAD protocol, RASS goal -1 to -2  Major depressive disorder with anxiety Chronic pain syndrome.   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 Low albumin suggest chronic problem. Hepatic steatosis on CT abdomen 2022.  - AST/ALT improving. Expect liver function is going to continue to trend down, half life of Alk Phos is greater. P: - Trend   GERD w/ h/o gastritis  P: PPI and cont sucralfate   Thrombocytopenia Factor 5 Leiden ,heterozygous  Platelets 132>109>87, stable 84 P: Platelets<100, continue Lovenox in setting of increased risk of DVT with Factor 5 Leiden Best Practice (right click and "Reselect all SmartList Selections" daily)   Diet/type: tubefeeds DVT prophylaxis: LMWH GI prophylaxis: PPI Lines: N/A Foley:  Yes, and it is still needed Code Status:  full code Last date of multidisciplinary goals of care discussion [pending ]

## 2021-02-17 NOTE — Procedures (Signed)
Chest [place]:  At bedside chest ultrasound showing right lower lobe dense consolidation and hepatization stage with small amount of pleural effusion, not a good window to drain.

## 2021-02-18 DIAGNOSIS — A419 Sepsis, unspecified organism: Secondary | ICD-10-CM | POA: Diagnosis not present

## 2021-02-18 DIAGNOSIS — R6521 Severe sepsis with septic shock: Secondary | ICD-10-CM | POA: Diagnosis not present

## 2021-02-18 DIAGNOSIS — I10 Essential (primary) hypertension: Secondary | ICD-10-CM | POA: Diagnosis not present

## 2021-02-18 DIAGNOSIS — J9602 Acute respiratory failure with hypercapnia: Secondary | ICD-10-CM | POA: Diagnosis not present

## 2021-02-18 DIAGNOSIS — J90 Pleural effusion, not elsewhere classified: Secondary | ICD-10-CM | POA: Diagnosis not present

## 2021-02-18 DIAGNOSIS — G928 Other toxic encephalopathy: Secondary | ICD-10-CM | POA: Diagnosis not present

## 2021-02-18 DIAGNOSIS — E878 Other disorders of electrolyte and fluid balance, not elsewhere classified: Secondary | ICD-10-CM | POA: Diagnosis not present

## 2021-02-18 DIAGNOSIS — N170 Acute kidney failure with tubular necrosis: Secondary | ICD-10-CM | POA: Diagnosis not present

## 2021-02-18 DIAGNOSIS — K831 Obstruction of bile duct: Secondary | ICD-10-CM | POA: Diagnosis not present

## 2021-02-18 DIAGNOSIS — J690 Pneumonitis due to inhalation of food and vomit: Secondary | ICD-10-CM | POA: Diagnosis not present

## 2021-02-18 DIAGNOSIS — N179 Acute kidney failure, unspecified: Secondary | ICD-10-CM | POA: Diagnosis not present

## 2021-02-18 DIAGNOSIS — Z20822 Contact with and (suspected) exposure to covid-19: Secondary | ICD-10-CM | POA: Diagnosis not present

## 2021-02-18 DIAGNOSIS — J9601 Acute respiratory failure with hypoxia: Secondary | ICD-10-CM | POA: Diagnosis not present

## 2021-02-18 DIAGNOSIS — K7200 Acute and subacute hepatic failure without coma: Secondary | ICD-10-CM | POA: Diagnosis not present

## 2021-02-18 LAB — BASIC METABOLIC PANEL
Anion gap: 18 — ABNORMAL HIGH (ref 5–15)
BUN: 13 mg/dL (ref 6–20)
CO2: 28 mmol/L (ref 22–32)
Calcium: 9 mg/dL (ref 8.9–10.3)
Chloride: 91 mmol/L — ABNORMAL LOW (ref 98–111)
Creatinine, Ser: 0.74 mg/dL (ref 0.44–1.00)
GFR, Estimated: >60 mL/min (ref >60)
Glucose, Bld: 239 mg/dL — ABNORMAL HIGH (ref 70–99)
Potassium: 3.2 mmol/L — ABNORMAL LOW (ref 3.5–5.1)
Sodium: 137 mmol/L (ref 135–145)

## 2021-02-18 LAB — CULTURE, RESPIRATORY W GRAM STAIN: Special Requests: NORMAL

## 2021-02-18 LAB — GLUCOSE, CAPILLARY
Glucose-Capillary: 112 mg/dL — ABNORMAL HIGH (ref 70–99)
Glucose-Capillary: 116 mg/dL — ABNORMAL HIGH (ref 70–99)
Glucose-Capillary: 117 mg/dL — ABNORMAL HIGH (ref 70–99)
Glucose-Capillary: 134 mg/dL — ABNORMAL HIGH (ref 70–99)
Glucose-Capillary: 147 mg/dL — ABNORMAL HIGH (ref 70–99)
Glucose-Capillary: 170 mg/dL — ABNORMAL HIGH (ref 70–99)

## 2021-02-18 LAB — CBC WITH DIFFERENTIAL/PLATELET
Abs Immature Granulocytes: 1 10*3/uL — ABNORMAL HIGH (ref 0.00–0.07)
Basophils Absolute: 0.1 10*3/uL (ref 0.0–0.1)
Basophils Relative: 1 %
Eosinophils Absolute: 0 10*3/uL (ref 0.0–0.5)
Eosinophils Relative: 0 %
HCT: 39.5 % (ref 36.0–46.0)
Hemoglobin: 13.1 g/dL (ref 12.0–15.0)
Immature Granulocytes: 11 %
Lymphocytes Relative: 18 %
Lymphs Abs: 1.7 10*3/uL (ref 0.7–4.0)
MCH: 33.1 pg (ref 26.0–34.0)
MCHC: 33.2 g/dL (ref 30.0–36.0)
MCV: 99.7 fL (ref 80.0–100.0)
Monocytes Absolute: 1.1 10*3/uL — ABNORMAL HIGH (ref 0.1–1.0)
Monocytes Relative: 12 %
Neutro Abs: 5.6 10*3/uL (ref 1.7–7.7)
Neutrophils Relative %: 58 %
Platelets: 114 10*3/uL — ABNORMAL LOW (ref 150–400)
RBC: 3.96 MIL/uL (ref 3.87–5.11)
RDW: 13.1 % (ref 11.5–15.5)
WBC: 9.4 10*3/uL (ref 4.0–10.5)
nRBC: 0 % (ref 0.0–0.2)

## 2021-02-18 LAB — COMPREHENSIVE METABOLIC PANEL
ALT: 83 U/L — ABNORMAL HIGH (ref 0–44)
AST: 86 U/L — ABNORMAL HIGH (ref 15–41)
Albumin: 2.1 g/dL — ABNORMAL LOW (ref 3.5–5.0)
Alkaline Phosphatase: 192 U/L — ABNORMAL HIGH (ref 38–126)
Anion gap: 10 (ref 5–15)
BUN: 15 mg/dL (ref 6–20)
CO2: 34 mmol/L — ABNORMAL HIGH (ref 22–32)
Calcium: 8.9 mg/dL (ref 8.9–10.3)
Chloride: 96 mmol/L — ABNORMAL LOW (ref 98–111)
Creatinine, Ser: 0.77 mg/dL (ref 0.44–1.00)
GFR, Estimated: >60 mL/min (ref >60)
Glucose, Bld: 114 mg/dL — ABNORMAL HIGH (ref 70–99)
Potassium: 4.1 mmol/L (ref 3.5–5.1)
Sodium: 140 mmol/L (ref 135–145)
Total Bilirubin: 0.9 mg/dL (ref 0.3–1.2)
Total Protein: 6.1 g/dL — ABNORMAL LOW (ref 6.5–8.1)

## 2021-02-18 LAB — MAGNESIUM: Magnesium: 1.6 mg/dL — ABNORMAL LOW (ref 1.7–2.4)

## 2021-02-18 LAB — GAMMA GT: GGT: 120 U/L — ABNORMAL HIGH (ref 7–50)

## 2021-02-18 LAB — TROPONIN I (HIGH SENSITIVITY): Troponin I (High Sensitivity): 51 ng/L — ABNORMAL HIGH (ref <18)

## 2021-02-18 MED ORDER — AMIODARONE IV BOLUS ONLY 150 MG/100ML
150.0000 mg | Freq: Once | INTRAVENOUS | Status: DC
  Filled 2021-02-18: qty 100

## 2021-02-18 MED ORDER — POLYETHYLENE GLYCOL 3350 17 G PO PACK
17.0000 g | PACK | Freq: Every day | ORAL | Status: DC
  Administered 2021-02-21: 17 g via ORAL
  Filled 2021-02-18: qty 1

## 2021-02-18 MED ORDER — DOCUSATE SODIUM 50 MG/5ML PO LIQD
100.0000 mg | Freq: Two times a day (BID) | ORAL | Status: DC | PRN
Start: 2021-02-18 — End: 2021-02-21
  Filled 2021-02-18: qty 10

## 2021-02-18 MED ORDER — AMIODARONE HCL IN DEXTROSE 360-4.14 MG/200ML-% IV SOLN
60.0000 mg/h | INTRAVENOUS | Status: DC
  Administered 2021-02-18 (×3): 60 mg/h via INTRAVENOUS
  Filled 2021-02-18 (×3): qty 200

## 2021-02-18 MED ORDER — GUAIFENESIN 100 MG/5ML PO LIQD
10.0000 mL | Freq: Four times a day (QID) | ORAL | Status: DC
  Administered 2021-02-18 – 2021-02-21 (×11): 10 mL via ORAL
  Filled 2021-02-18 (×12): qty 10

## 2021-02-18 MED ORDER — CLONAZEPAM 0.25 MG PO TBDP
0.5000 mg | ORAL_TABLET | Freq: Two times a day (BID) | ORAL | Status: DC
  Administered 2021-02-18 – 2021-02-21 (×6): 0.5 mg via ORAL
  Filled 2021-02-18: qty 1
  Filled 2021-02-18: qty 2
  Filled 2021-02-18 (×2): qty 1
  Filled 2021-02-18: qty 2
  Filled 2021-02-18: qty 1

## 2021-02-18 MED ORDER — POTASSIUM CHLORIDE 10 MEQ/100ML IV SOLN
10.0000 meq | INTRAVENOUS | Status: AC
  Administered 2021-02-18 – 2021-02-19 (×4): 10 meq via INTRAVENOUS
  Filled 2021-02-18 (×4): qty 100

## 2021-02-18 MED ORDER — AMPHETAMINE-DEXTROAMPHETAMINE 10 MG PO TABS
20.0000 mg | ORAL_TABLET | Freq: Every day | ORAL | Status: DC
  Administered 2021-02-19 – 2021-02-21 (×3): 20 mg via ORAL
  Filled 2021-02-18 (×4): qty 2

## 2021-02-18 MED ORDER — HYDROCHLOROTHIAZIDE 12.5 MG PO TABS
12.5000 mg | ORAL_TABLET | Freq: Every day | ORAL | Status: DC
  Administered 2021-02-18 – 2021-02-21 (×4): 12.5 mg via ORAL
  Filled 2021-02-18 (×6): qty 1

## 2021-02-18 MED ORDER — LABETALOL HCL 5 MG/ML IV SOLN
10.0000 mg | Freq: Once | INTRAVENOUS | Status: AC
  Administered 2021-02-18: 10 mg via INTRAVENOUS
  Filled 2021-02-18: qty 4

## 2021-02-18 MED ORDER — VORTIOXETINE HBR 5 MG PO TABS
10.0000 mg | ORAL_TABLET | Freq: Every day | ORAL | Status: DC
  Administered 2021-02-19 – 2021-02-21 (×3): 10 mg via ORAL
  Filled 2021-02-18 (×3): qty 2

## 2021-02-18 MED ORDER — AMITRIPTYLINE HCL 50 MG PO TABS
75.0000 mg | ORAL_TABLET | Freq: Every day | ORAL | Status: DC
  Administered 2021-02-18 – 2021-02-20 (×3): 75 mg via ORAL
  Filled 2021-02-18 (×2): qty 2
  Filled 2021-02-18: qty 1

## 2021-02-18 MED ORDER — AMIODARONE LOAD VIA INFUSION
150.0000 mg | Freq: Once | INTRAVENOUS | Status: AC
  Administered 2021-02-18: 150 mg via INTRAVENOUS
  Filled 2021-02-18: qty 83.34

## 2021-02-18 MED ORDER — MAGNESIUM SULFATE 2 GM/50ML IV SOLN
2.0000 g | Freq: Once | INTRAVENOUS | Status: AC
  Administered 2021-02-18: 2 g via INTRAVENOUS
  Filled 2021-02-18: qty 50

## 2021-02-18 MED ORDER — AMIODARONE HCL IN DEXTROSE 360-4.14 MG/200ML-% IV SOLN
30.0000 mg/h | INTRAVENOUS | Status: DC
  Administered 2021-02-19: 30 mg/h via INTRAVENOUS
  Filled 2021-02-18 (×2): qty 200

## 2021-02-18 MED ORDER — METOPROLOL TARTRATE 5 MG/5ML IV SOLN
INTRAVENOUS | Status: AC
  Administered 2021-02-18: 5 mg via INTRAVENOUS
  Filled 2021-02-18: qty 5

## 2021-02-18 MED ORDER — SENNOSIDES-DOCUSATE SODIUM 8.6-50 MG PO TABS
1.0000 | ORAL_TABLET | Freq: Two times a day (BID) | ORAL | Status: DC
  Administered 2021-02-20 – 2021-02-21 (×2): 1 via ORAL
  Filled 2021-02-18 (×3): qty 1

## 2021-02-18 MED ORDER — ACETAMINOPHEN 160 MG/5ML PO SOLN: 650.0000 mg | ORAL | Status: DC | PRN

## 2021-02-18 MED ORDER — PANTOPRAZOLE SODIUM 40 MG PO TBEC
40.0000 mg | DELAYED_RELEASE_TABLET | Freq: Every day | ORAL | Status: DC
  Administered 2021-02-18 – 2021-02-21 (×4): 40 mg via ORAL
  Filled 2021-02-18 (×4): qty 1

## 2021-02-18 MED ORDER — ORAL CARE MOUTH RINSE
15.0000 mL | Freq: Two times a day (BID) | OROMUCOSAL | Status: DC
  Administered 2021-02-18 – 2021-02-21 (×7): 15 mL via OROMUCOSAL

## 2021-02-18 MED ORDER — SUCRALFATE 1 GM/10ML PO SUSP
1.0000 g | Freq: Two times a day (BID) | ORAL | Status: DC
  Administered 2021-02-18 – 2021-02-21 (×6): 1 g via ORAL
  Filled 2021-02-18 (×7): qty 10

## 2021-02-18 MED ORDER — DOCUSATE SODIUM 50 MG/5ML PO LIQD
100.0000 mg | Freq: Two times a day (BID) | ORAL | Status: DC
  Administered 2021-02-21: 100 mg via ORAL
  Filled 2021-02-18 (×3): qty 10

## 2021-02-18 MED ORDER — IPRATROPIUM-ALBUTEROL 0.5-2.5 (3) MG/3ML IN SOLN: 3.0000 mL | Freq: Four times a day (QID) | RESPIRATORY_TRACT | Status: DC | PRN

## 2021-02-18 MED ORDER — METOPROLOL TARTRATE 5 MG/5ML IV SOLN: 5.0000 mg | Freq: Once | INTRAVENOUS | Status: AC

## 2021-02-18 MED ORDER — CITALOPRAM HYDROBROMIDE 20 MG PO TABS
20.0000 mg | ORAL_TABLET | Freq: Every day | ORAL | Status: DC
  Administered 2021-02-19 – 2021-02-21 (×3): 20 mg via ORAL
  Filled 2021-02-18 (×3): qty 1

## 2021-02-18 MED ORDER — LISINOPRIL 20 MG PO TABS
20.0000 mg | ORAL_TABLET | Freq: Every day | ORAL | Status: DC
  Administered 2021-02-18 – 2021-02-21 (×4): 20 mg via ORAL
  Filled 2021-02-18 (×2): qty 2
  Filled 2021-02-18: qty 1
  Filled 2021-02-18: qty 2

## 2021-02-18 MED ORDER — POTASSIUM CHLORIDE 20 MEQ PO PACK
20.0000 meq | PACK | ORAL | Status: DC
  Administered 2021-02-18: 20 meq
  Filled 2021-02-18: qty 1

## 2021-02-18 NOTE — Progress Notes (Addendum)
NAME:  [name], MRN:  [id number], DOB:  02-19-1970, LOS: 4 ADMISSION DATE:  02/14/2021, CONSULTATION DATE:  2/3 REFERRING MD: Melvyn Novas CHIEF COMPLAINT:  respiratory failure   History of Present Illness:  51 year old female found unresponsive the am of 2/3. For few Days prior family reporting GI illness w/ vomiting & diarrhea. Room air sats 80s, only arouses  to sternal rub. Dried emesis on face.  ER eval LA 2.7, K 5.5 creatine 2.58, got narcan w/ some improvement in mental status. CXR showed RLL airspace disease. Abx started. ABG obtained: 7.23/74/92/24 intubated.  [facility] for [facility].   Pertinent  Medical History  Factor V Leiden heterozygous mutation, essential HTN, chronic laryngitis, anxiety w/ panic attacks, chronic pain, prior esophagitis, scoliosis   Significant Hospital Events: Including procedures, antibiotic start and stop dates in addition to other pertinent events   2/3 admitted w/ acute resp failure 2/2 aspiration PNA and sepsis. IV Levaquin (anaphylaxis reported to PCN). Intubated in ER  2/4 PICC line placed, started on/off NE gtt for soft BP. + Strep pneumo urine antigen. Bronch thick mucus/food, unable to remove foreign body 2/5: Repeat Bronch, right lower lobe lateral segment with foreign body, could not be removed with suction.  2/6: Self extubated while on max Precedex infusion with fentanyl  Interim History / Subjective:  No acute events overnight.  Patient sitting in bedside chair this morning. She is on HFNC. She doesn't remember events leading up to her admission. On HFNC. No chest pain,no abdominal pain.   Objective   Blood pressure (!) 160/87, pulse 90, temperature 98.2 F (36.8 C), temperature source Oral, resp. rate 18, height _0  (1.575 m), weight 107.8 kg, SpO2 96 %.     Tmax 101.8 in last 24 hours 96% on 10 HFNC  Vent Mode: PCV FiO2 (%):  [50 %-100 %] 50 % Set Rate:  [30 bmp] 30 bmp PEEP:  [8 cmH20] 8 cmH20 Plateau Pressure:  [24  cmH20] 24 cmH20   Intake/Output Summary (Last 24 hours) at 02/18/2021 0559 Last data filed at 02/18/2021 0400 Gross per 24 hour  Intake 1030.33 ml  Output 3100 ml  Net -2069.67 ml      Filed Weights   02/15/21 0329 02/16/21 0500 02/18/21 0400  Weight: 109.2 kg 109.4 kg 107.8 kg   Examination: General:  sitting in bedside chair on HFNC at 10L, NAD HENT: NCAT , antiicteric sclerae  PULM: Normal work of breathing, scattered rhonchi CV: RRR, no mgr GI: BS+, soft, nontender MSK: normal bulk and tone Neuro: Alert . Oriented person place time   Urine output: 1,600 ml Stool: bowel movement overnight. Net: -770 Pressors: n/a Sedation: n/a Lines: R. PICC,Left peripheral IV both 3 days External Foley Catheter  Platelets improving , 84>114. Other CBC values wnl CO2 32>34 Albumin 1.9>2.1 AST 99>86 ALT 44>83 Alk Phos 179>192  Blood culture 2/03: NGTD at 4 days Respiratory culture 2/03, sputum: Abundant Streptococcus pneumoniae, sensitive to levofloxacin. Few E.coli Respiratory culture, BAL 2/04: Gram-positive cocci, reincubated for better growth   Resolved Hospital Problem list   Sepsis AKI  Assessment & Plan:  Principal Problem:   Respiratory failure with hypoxia and hypercapnia (HCC) Active Problems:   Depression with anxiety   Chronic pain syndrome   Factor V Leiden mutation ([place])   Aspiration pneumonia of right lower lobe due to vomit ([place])   Sepsis with acute organ dysfunction ([place])   Toxic metabolic encephalopathy   AKI (acute kidney injury) ([place])  Alteration in electrolyte and fluid balance   Lactic acidosis   Elevated LFTs   H/O gastroesophageal reflux (GERD)   Hyperlipidemia   Acute respiratory failure with hypercapnia (HCC)  Acute Respiratory Failure with hypoxemia secondary to strep pneumo and aspiration pneumonia -Strep pneumonia sputum culture and strep pneumo urine antigen positive.  Patient also had few E. coli and Strep Agalactia on sputum  cultures. -Self-extubated on 2/6 and now on high flow nasal cannula -Fevered, but overall clinically improving on levo, day 5 P: - Continue Levaquin , pharmacy renally dosing, plan to treat for 7 days, stop date 2/9 - Wean supplemental oxygen as tolerated to maintain SPO2 greater than 88%  Abnormal liver test with cholestatic pattern -AST and ALT holding steady, alk phos continues to rise slightly.  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 R ratio 1.24. - Low albumin suggest chronic problem. Hepatic steatosis on CT abdomen 2022. Cholecystectomy in 2013.History of elevated Alk phos in chart. Appears had a US abdomen schedule by PCP for later this month for chronic elevation. GGT elevated consistent with hepatobiliary etiology.  P: - hepatitis panel  - outpatient follow up  Thrombocytopenia Factor 5 Leiden ,heterozygous  Platelet count improving.  84 > 114 P: Continue Lovenox  Hx of Major depressive disorder with anxiety  and Chronic pain syndrome.   P: - Cont Celexa Elavil 24m q hs VT Trintellix 161md - continue 0.5 clonazepam for xanax - on Vyvanse at home, could not give per tube, equivalent adderall dose per tube - Hold Lyrica for now (was on 200 mg tid)  - Hold Robaxin   Hx of GERD w/ h/o gastritis  P: PPI and cont sucralfate   HTN Metabolic Alkalosis - Expect patient has OSA , BMI 43.  P: Start home HCTZ Start home Lisinopril Needs outpatient sleep study  Best Practice (right click and "Reselect all SmartList Selections" daily)   Diet/type: Regular consistency (see orders) DVT prophylaxis: LMWH GI prophylaxis: PPI Lines: N/A Foley:  N/A Code Status:  full code Last date of multidisciplinary goals of care discussion [resident to update daughter 2/7]    Critical care attending attestation note:  Patient seen and examined and relevant ancillary tests reviewed.  I agree with the assessment and plan of care as outlined by StMadalyn RobMD . The  following reflects my independent critical care time.   Synopsis of assessment and plan: [age]ear old female was brought in the emergency department after she was found unresponsive, noted to have bilateral lower lobe pneumonia with strep pneumo and E. Coli  Patient self extubated yesterday after sedation was turned down, she is on high flow nasal cannula oxygen at 8 L, tolerating it well so far   Physical exam: General: Acute on chronically ill-appearing female, on high flow nasal cannula oxygen HEENT: [place]/AT, eyes anicteric.  ETT and OGT in place Neuro: Alert, awake, following commands, moving all 4 extremities Chest: Frequent coughing spells, crackles heard at bases bilaterally, no wheezes or rhonchi Heart: Regular rate and rhythm, no murmurs or gallops Abdomen: Soft, nontender, nondistended, bowel sounds present Skin: No rash  Labs and images were reviewed  Assessment and plan: Sepsis, POA Acute hypoxic/hypercapnic respiratory failure Aspiration and community-acquired pneumonia with strep pneumococci and E. coli Shock liver, improving Thrombocytopenia due to sepsis Acute kidney injury, improving Morbid obesity Hypertriglyceridemia, improved after propofol was stopped Constipation Uncontrolled hypertension  Patient's sepsis indices are improving Currently on 8 L oxygen via high flow nasal cannula Continue IV Levaquin to complete 7  days therapy Titrate FiO2 with O2 sat goal 88-92% We will get acute hepatitis panel Though LFTs are improving Platelet count have improved Serum creatinine is back to baseline Dietitian is following Continue aggressive bowel regimen Resume home meds including HCTZ and lisinopril   Jacky Kindle MD [facility] See Amion for pager If no response to pager, please call [phone] until 7pm After 7pm, Please call E-link [phone]  02/18/2021, 12:31 PM

## 2021-02-18 NOTE — Progress Notes (Signed)
Transfer order placed. [facility] to takeover care tomorrow at 7 am.

## 2021-02-18 NOTE — Progress Notes (Signed)
SVT vs. Afib/ RVR Bps borderline Add amiodarone and empiric dose of mag  Myrla Halsted MD PCCM

## 2021-02-18 NOTE — Progress Notes (Signed)
[facility] ADULT ICU REPLACEMENT PROTOCOL   The patient does apply for the [facility] Adult ICU Electrolyte Replacment Protocol based on the criteria listed below:   1.Exclusion criteria: TCTS patients, ECMO patients, and Dialysis patients 2. Is GFR >/= 30 ml/min? Yes.    Patient's GFR today is >60 3. Is SCr </= 2? Yes.   Patient's SCr is 0.74 mg/dL 4. Did SCr increase >/= 0.5 in 24 hours? No. 5.Pt's weight >40kg  Yes.   6. Abnormal electrolyte(s): K 3.2  7. Electrolytes replaced per protocol 8.  Call MD STAT for K+ </= 2.5, Phos </= 1, or Mag </= 1 Physician:    Markus Daft A 02/18/2021 11:23 PM

## 2021-02-18 NOTE — Progress Notes (Signed)
Nutrition Follow-up  DOCUMENTATION CODES:   Morbid obesity  INTERVENTION:   Ensure Enlive po BID, each supplement provides 350 kcal and 20 grams of protein.  NUTRITION DIAGNOSIS:   Inadequate oral intake related to decreased appetite as evidenced by meal completion < 50%.  Ongoing   GOAL:   Patient will meet greater than or equal to 90% of their needs  Progressing  MONITOR:   PO intake, Supplement acceptance  REASON FOR ASSESSMENT:   Ventilator, Consult Enteral/tube feeding initiation and management  ASSESSMENT:   51 yo female admitted with respiratory failure after being found unresponsive this morning, aspiration PNA, sepsis, AKI. PMH includes Factor V Leiden mutation, HTN, chronic laryngitis, anxiety, chronic pain, esophagitis, scoliosis, GERD, HLD.  Discussed patient in ICU rounds and with RN today. Patient was extubated 2/6. Diet has been advanced to heart healthy. Poor intake of breakfast today per RN. Patient reports poor appetite.  Will add a PO supplement to maximize oral intake.   Labs and medications reviewed.  CBG: [id number]  Diet Order:   Diet Order             Diet Heart Room service appropriate? Yes; Fluid consistency: Thin  Diet effective now                   EDUCATION NEEDS:   No education needs have been identified at this time  Skin:  Skin Assessment: Reviewed RN Assessment  Last BM:  2/7 type 6  Height:   Ht Readings from Last 1 Encounters:  02/16/21 5\' 2"  (1.575 m)    Weight:   Wt Readings from Last 1 Encounters:  02/18/21 107.8 kg    BMI:  Body mass index is 43.47 kg/m.  Estimated Nutritional Needs:   Kcal:  1800-2000  Protein:  100-125 gm  Fluid:  >/= 1.8 L    Lucas Mallow RD, LDN, CNSC Please refer to Amion for contact information.

## 2021-02-18 NOTE — Evaluation (Signed)
Physical Therapy Evaluation Patient Details Name: [name] MRN: [id number] DOB: Apr 11, 1970 Today's Date: 02/18/2021  History of Present Illness  Pt adm 2/3 with acute resp failure secondary to aspiration PNA and sepsis. Intubated in ED. Bronch 2/5 with foreign body. Self extubated 2/6. PMH - Leiden Factor V, HTN, chronic laryngitis, anxiety, chronic pain.  Clinical Impression  Pt presents to PT with decreased mobility due to decreased strength, decreased balance, and decreased functional activity tolerance. Pt has good support at home so will likely need HHPT at DC.         Recommendations for follow up therapy are one component of a multi-disciplinary discharge planning process, led by the attending physician.  Recommendations may be updated based on patient status, additional functional criteria and insurance authorization.  Follow Up Recommendations Home health PT    Assistance Recommended at Discharge Frequent or constant Supervision/Assistance  Patient can return home with the following  A little help with walking and/or transfers;Help with stairs or ramp for entrance    Equipment Recommendations Rollator (4 wheels)  Recommendations for Other Services       Functional Status Assessment Patient has had a recent decline in their functional status and demonstrates the ability to make significant improvements in function in a reasonable and predictable amount of time.     Precautions / Restrictions Restrictions Weight Bearing Restrictions: No      Mobility  Bed Mobility Overal bed mobility: Needs Assistance Bed Mobility: Supine to Sit, Sit to Supine     Supine to sit: Min assist Sit to supine: Min assist   General bed mobility comments: Assist to elevate trunk into sitting and to bring legs back up into bed.    Transfers Overall transfer level: Needs assistance Equipment used: Rollator (4 wheels) Transfers: Sit to/from Stand Sit to Stand: Min assist            General transfer comment: Assist to bring hips up and for balance    Ambulation/Gait Ambulation/Gait assistance: Min assist Gait Distance (Feet): 80 Feet Assistive device: Rollator (4 wheels) Gait Pattern/deviations: Step-through pattern, Decreased stride length Gait velocity: decr Gait velocity interpretation: <1.31 ft/sec, indicative of household ambulator   General Gait Details: assist for balance and support  Stairs            Wheelchair Mobility    Modified Rankin (Stroke Patients Only)       Balance Overall balance assessment: Needs assistance Sitting-balance support: No upper extremity supported, Feet supported Sitting balance-[name] Scale: Fair     Standing balance support: Bilateral upper extremity supported, During functional activity Standing balance-[name] Scale: Poor Standing balance comment: UE support and min guard for static standing                             Pertinent Vitals/Pain Pain Assessment Pain Assessment: No/denies pain    Home Living Family/patient expects to be discharged to:: Private residence Living Arrangements: Children;Spouse/significant other (son, son's wife, significant other) Available Help at Discharge: Family;Available 24 hours/day Type of Home: Mobile home Home Access: Stairs to enter Entrance Stairs-Rails: Right Entrance Stairs-Number of Steps: 2   Home Layout: One level Home Equipment: None      Prior Function Prior Level of Function : Independent/Modified Independent             Mobility Comments: no assistive device       Hand Dominance        Extremity/Trunk Assessment  Upper Extremity Assessment Upper Extremity Assessment: Defer to OT evaluation    Lower Extremity Assessment Lower Extremity Assessment: Generalized weakness       Communication   Communication: No difficulties  Cognition Arousal/Alertness: Awake/alert Behavior During Therapy: WFL for tasks  assessed/performed Overall Cognitive Status: Within Functional Limits for tasks assessed                                          General Comments General comments (skin integrity, edema, etc.): Pt on 8L O2 with SpO2 > 90%. HR to 160 briefly before return to 130    Exercises     Assessment/Plan    PT Assessment Patient needs continued PT services  PT Problem List Decreased strength;Decreased activity tolerance;Decreased balance;Decreased mobility;Decreased knowledge of use of DME;Cardiopulmonary status limiting activity       PT Treatment Interventions DME instruction;Gait training;Functional mobility training;Stair training;Therapeutic activities;Therapeutic exercise;Balance training;Patient/family education    PT Goals (Current goals can be found in the Care Plan section)  Acute Rehab PT Goals Patient Stated Goal: return home PT Goal Formulation: With patient Time For Goal Achievement: 03/04/21 Potential to Achieve Goals: Good    Frequency Min 3X/week     Co-evaluation               AM-PAC PT "6 Clicks" Mobility  Outcome Measure Help needed turning from your back to your side while in a flat bed without using bedrails?: A Little Help needed moving from lying on your back to sitting on the side of a flat bed without using bedrails?: A Little Help needed moving to and from a bed to a chair (including a wheelchair)?: A Little Help needed standing up from a chair using your arms (e.g., wheelchair or bedside chair)?: A Little Help needed to walk in hospital room?: A Little Help needed climbing 3-5 steps with a railing? : A Little 6 Click Score: 18    End of Session Equipment Utilized During Treatment: Oxygen Activity Tolerance: Patient limited by fatigue Patient left: in bed;with call bell/phone within reach;with nursing/sitter in room Nurse Communication: Mobility status PT Visit Diagnosis: Unsteadiness on feet (R26.81);Other abnormalities of gait  and mobility (R26.89);Muscle weakness (generalized) (M62.81)    Time: 6962-9528 PT Time Calculation (min) (ACUTE ONLY): 20 min   Charges:   PT Evaluation $PT Eval Moderate Complexity: 1 Mod          [facility] PT Acute Rehabilitation Services Pager [phone] Office [phone]   Angelina Ok [facility] 02/18/2021, 5:26 PM

## 2021-02-19 DIAGNOSIS — E785 Hyperlipidemia, unspecified: Secondary | ICD-10-CM

## 2021-02-19 DIAGNOSIS — G928 Other toxic encephalopathy: Secondary | ICD-10-CM

## 2021-02-19 DIAGNOSIS — K7200 Acute and subacute hepatic failure without coma: Secondary | ICD-10-CM

## 2021-02-19 DIAGNOSIS — Z8719 Personal history of other diseases of the digestive system: Secondary | ICD-10-CM

## 2021-02-19 DIAGNOSIS — R6520 Severe sepsis without septic shock: Secondary | ICD-10-CM

## 2021-02-19 DIAGNOSIS — D6851 Activated protein C resistance: Secondary | ICD-10-CM

## 2021-02-19 DIAGNOSIS — I4891 Unspecified atrial fibrillation: Secondary | ICD-10-CM

## 2021-02-19 DIAGNOSIS — E872 Acidosis, unspecified: Secondary | ICD-10-CM

## 2021-02-19 DIAGNOSIS — R7989 Other specified abnormal findings of blood chemistry: Secondary | ICD-10-CM

## 2021-02-19 DIAGNOSIS — F418 Other specified anxiety disorders: Secondary | ICD-10-CM

## 2021-02-19 LAB — COMPREHENSIVE METABOLIC PANEL
ALT: 75 U/L — ABNORMAL HIGH (ref 0–44)
AST: 62 U/L — ABNORMAL HIGH (ref 15–41)
Albumin: 2.5 g/dL — ABNORMAL LOW (ref 3.5–5.0)
Alkaline Phosphatase: 190 U/L — ABNORMAL HIGH (ref 38–126)
Anion gap: 15 (ref 5–15)
BUN: 13 mg/dL (ref 6–20)
CO2: 28 mmol/L (ref 22–32)
Calcium: 9.4 mg/dL (ref 8.9–10.3)
Chloride: 92 mmol/L — ABNORMAL LOW (ref 98–111)
Creatinine, Ser: 0.71 mg/dL (ref 0.44–1.00)
GFR, Estimated: >60 mL/min (ref >60)
Glucose, Bld: 138 mg/dL — ABNORMAL HIGH (ref 70–99)
Potassium: 3.7 mmol/L (ref 3.5–5.1)
Sodium: 135 mmol/L (ref 135–145)
Total Bilirubin: 0.9 mg/dL (ref 0.3–1.2)
Total Protein: 7.2 g/dL (ref 6.5–8.1)

## 2021-02-19 LAB — GLUCOSE, CAPILLARY
Glucose-Capillary: 133 mg/dL — ABNORMAL HIGH (ref 70–99)
Glucose-Capillary: 120 mg/dL — ABNORMAL HIGH (ref 70–99)
Glucose-Capillary: 113 mg/dL — ABNORMAL HIGH (ref 70–99)
Glucose-Capillary: 142 mg/dL — ABNORMAL HIGH (ref 70–99)
Glucose-Capillary: 142 mg/dL — ABNORMAL HIGH (ref 70–99)

## 2021-02-19 LAB — HEPATITIS PANEL, ACUTE
HCV Ab: NONREACTIVE
Hep A IgM: NONREACTIVE
Hep B C IgM: NONREACTIVE
Hepatitis B Surface Ag: NONREACTIVE

## 2021-02-19 LAB — CULTURE, BLOOD (ROUTINE X 2)
Culture: NO GROWTH
Culture: NO GROWTH
Special Requests: ADEQUATE

## 2021-02-19 LAB — CBC WITH DIFFERENTIAL/PLATELET
Abs Immature Granulocytes: 0.6 10*3/uL — ABNORMAL HIGH (ref 0.00–0.07)
Basophils Absolute: 0 10*3/uL (ref 0.0–0.1)
Basophils Relative: 0 %
Eosinophils Absolute: 0 10*3/uL (ref 0.0–0.5)
Eosinophils Relative: 0 %
HCT: 44.6 % (ref 36.0–46.0)
Hemoglobin: 15.7 g/dL — ABNORMAL HIGH (ref 12.0–15.0)
Lymphocytes Relative: 13 %
Lymphs Abs: 2 10*3/uL (ref 0.7–4.0)
MCH: 33.5 pg (ref 26.0–34.0)
MCHC: 35.2 g/dL (ref 30.0–36.0)
MCV: 95.1 fL (ref 80.0–100.0)
Monocytes Absolute: 1.4 10*3/uL — ABNORMAL HIGH (ref 0.1–1.0)
Monocytes Relative: 9 %
Myelocytes: 4 %
Neutro Abs: 11.5 10*3/uL — ABNORMAL HIGH (ref 1.7–7.7)
Neutrophils Relative %: 74 %
Platelets: 167 10*3/uL (ref 150–400)
RBC: 4.69 MIL/uL (ref 3.87–5.11)
RDW: 12.8 % (ref 11.5–15.5)
WBC: 15.6 10*3/uL — ABNORMAL HIGH (ref 4.0–10.5)
nRBC: 0 % (ref 0.0–0.2)
nRBC: 0 /100 WBC

## 2021-02-19 LAB — TROPONIN I (HIGH SENSITIVITY): Troponin I (High Sensitivity): 39 ng/L — ABNORMAL HIGH (ref <18)

## 2021-02-19 MED ORDER — SODIUM CHLORIDE 0.9% FLUSH
3.0000 mL | Freq: Two times a day (BID) | INTRAVENOUS | Status: DC
  Administered 2021-02-19 – 2021-02-21 (×3): 3 mL

## 2021-02-19 MED ORDER — METOPROLOL TARTRATE 5 MG/5ML IV SOLN
2.5000 mg | INTRAVENOUS | Status: AC | PRN
  Administered 2021-02-19 (×3): 5 mg via INTRAVENOUS
  Filled 2021-02-19 (×3): qty 5

## 2021-02-19 MED ORDER — NICOTINE 21 MG/24HR TD PT24
21.0000 mg | MEDICATED_PATCH | Freq: Every day | TRANSDERMAL | Status: DC
  Administered 2021-02-19 – 2021-02-21 (×3): 21 mg via TRANSDERMAL
  Filled 2021-02-19 (×3): qty 1

## 2021-02-19 MED ORDER — POTASSIUM CHLORIDE 20 MEQ PO PACK
20.0000 meq | PACK | ORAL | Status: AC
  Administered 2021-02-19: 20 meq via ORAL
  Filled 2021-02-19: qty 1

## 2021-02-19 MED ORDER — METOPROLOL TARTRATE 5 MG/5ML IV SOLN
INTRAVENOUS | Status: AC
  Administered 2021-02-19: 5 mg via INTRAVENOUS
  Filled 2021-02-19: qty 5

## 2021-02-19 MED ORDER — AMIODARONE HCL 200 MG PO TABS
200.0000 mg | ORAL_TABLET | Freq: Every day | ORAL | Status: DC
  Administered 2021-02-19 – 2021-02-21 (×3): 200 mg via ORAL
  Filled 2021-02-19 (×3): qty 1

## 2021-02-19 MED ORDER — SODIUM CHLORIDE 0.9% FLUSH
3.0000 mL | INTRAVENOUS | Status: DC | PRN
  Administered 2021-02-19: 3 mL

## 2021-02-19 NOTE — Progress Notes (Signed)
PROGRESS NOTE    [name]  SAY:[id number] DOB: Dec 02, 1970 DOA: 02/14/2021 PCP: Lemmie Evens, MD   Brief Narrative:  51 year old female found unresponsive the am of 2/3. CXR showed RLL airspace disease. Abx started. ABG obtained: 7.23/74/92/24 and subsequently intubated.  Under PCCM care through 02/19/2021 -found to have strep pneumonia as well as likely concurrent aspiration pneumonia.  2/3 admitted w/ acute resp failure 2/2 aspiration PNA and sepsis. IV Levaquin (anaphylaxis reported to PCN). Intubated in ER  2/4 PICC line placed, started on/off NE gtt for soft BP. + Strep pneumo urine antigen. Bronch thick mucus/food, unable to remove foreign body 2/5: Repeat Bronch, right lower lobe lateral segment with foreign body, could not be removed with suction.  2/6: Self extubated while on max Precedex infusion with fentanyl 2/7: Tolerating HFNC - transferred to [facility] service 2/8 -PICC line incidentally pulled overnight, otherwise continues to improve clinically  Assessment & Plan:   Acute Respiratory Failure with hypoxemia secondary to strep pneumo and aspiration pneumonia -Strep pneumonia sputum culture and strep pneumo urine antigen positive.   -Self-extubated on 2/6 and now on high flow nasal cannula - continue to wean oxygen as tolerated -Continue Levaquin 7 days - stop date 2/9   Transient A-fib with RVR, resolved  -Amiodarone drip initiated overnight transition to p.o. 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 - Low albumin suggest chronic problem. Hepatic steatosis on CT abdomen 2022. Cholecystectomy in 2013.History of elevated Alk phos in chart. Appears had a US abdomen schedule by PCP for later this month for chronic elevation GGT elevated consistent with hepatobiliary etiology.  P: - hepatitis panel  nonreactive   Thrombocytopenia Factor 5 Leiden ,heterozygous  - Platelet count improving daily - Likely secondary to chronic liver dysfunction as above   Hx of Major depressive disorder with anxiety  and Chronic pain syndrome.   - Cont Celexa Elavil 35m qhs VT Trintellix 169md - continue 0.5 clonazepam for xanax - on Vyvanse at home, could not give per tube, equivalent adderall dose ongoing - Hold Lyrica for now (was on 200 mg tid)  - Hold Robaxin    Hx of GERD w/ h/o gastritis  -Continue PPI and sucralfate    HTN, essential Metabolic Alkalosis - Expect patient has OSA, BMI 43.  - Continue HCTZ/lisinopril - Needs outpatient sleep study per pulmonology    DVT prophylaxis: Lovenox Code Status: Full Family Communication: None present  Status is: Inpatient  Dispo: The patient is from: Home              Anticipated d/c is to: To be determined              Anticipated d/c date is: 72+h              Patient currently not medically stable for discharge  Consultants:  PCCM  Procedures:  Intubation/extubation as above  Antimicrobials:  Levaquin through 02/20/2021  Subjective: As above patient incidentally removed PICC overnight with transient episode of A-fib RVR resolved with amiodarone  Objective: Vitals:   02/19/21 0200 02/19/21 0300 02/19/21 0305 02/19/21 0600  BP: (!) 171/92 (!) 174/92 (!) 171/98 (!) 164/95  Pulse: 90 86 85 82  Resp: (!) 26 (!) 23 (!) 21 (!) 21  Temp:  98.5 F (36.9 C)    TempSrc:  Oral    SpO2: 92%  94% 95% 93%  Weight:      Height:        Intake/Output Summary (Last 24 hours) at 02/19/2021 0659 Last data filed at 02/19/2021 0600 Gross per 24 hour  Intake 1795.59 ml  Output 5650 ml  Net -3854.41 ml   Filed Weights   02/15/21 0329 02/16/21 0500 02/18/21 0400  Weight: 109.2 kg 109.4 kg 107.8 kg    Examination:  General:  Pleasantly resting in bed, No acute distress. HEENT:  Normocephalic atraumatic.  Sclerae nonicteric, noninjected.   Extraocular movements intact bilaterally. Neck:  Without mass or deformity.  Trachea is midline. Lungs:  Clear to auscultate bilaterally without rhonchi, wheeze, or rales. Heart:  Regular rate and rhythm.  Without murmurs, rubs, or gallops. Abdomen:  Soft, nontender, nondistended.  Without guarding or rebound. Extremities: Without cyanosis, clubbing, edema, or obvious deformity. Vascular:  Dorsalis pedis and posterior tibial pulses palpable bilaterally. Skin:  Warm and dry, no erythema, no ulcerations.  Data Reviewed: I have personally reviewed following labs and imaging studies  CBC: Recent Labs  Lab 02/14/21 0742 02/14/21 1421 02/15/21 0303 02/15/21 0443 02/15/21 1445 02/16/21 0315 02/17/21 0416 02/18/21 0430 02/19/21 0200  WBC 18.9*   < > 9.3  --   --  7.1 6.8 9.4 15.6*  NEUTROABS 11.9*  --   --   --   --  5.5 4.7 5.6 11.5*  HGB 15.5*   < > 12.4   < > 12.6 12.6 12.9 13.1 15.7*  HCT 45.9   < > 37.1   < > 37.0 36.4 39.1 39.5 44.6  MCV 102.5*   < > 101.4*  --   --  102.2* 102.9* 99.7 95.1  PLT 159   < > 109*  --   --  87* 84* 114* 167   < > = values in this interval not displayed.   Basic Metabolic Panel: Recent Labs  Lab 02/14/21 1421 02/14/21 1515 02/14/21 2153 02/15/21 0303 02/15/21 0443 02/15/21 1648 02/16/21 0315 02/17/21 0416 02/18/21 0430 02/18/21 2003 02/19/21 0200  NA 138   < >  --  135   < >  --  137 138 140 137 135  K 5.3*   < >  --  4.7   < >  --  4.6 4.8 4.1 3.2* 3.7  CL 102  --   --  99  --   --  99 98 96* 91* 92*  CO2 25  --   --  28  --   --  31 32 34* 28 28  GLUCOSE 112*  --   --  156*  --   --  135* 142* 114* 239* 138*  BUN 58*  --   --  54*  --   --  34* _0 CREATININE 2.19*  --   --  1.87*  --   --  1.12* 0.83 0.77 0.74 0.71  CALCIUM 7.6*  --   --  7.6*  --   --  8.0* 8.1* 8.9 9.0 9.4  MG 1.6*  --  1.6* 1.7  --  1.9  --   --   --  1.6*  --   PHOS 6.4*  --  5.2* 3.8  --  3.1  --   --   --   --   --    < > = values in this interval not  displayed.   GFR: Estimated Creatinine Clearance: 97.2 mL/min (by C-G formula based on  SCr of 0.71 mg/dL). Liver Function Tests: Recent Labs  Lab 02/14/21 1421 02/16/21 0315 02/17/21 0416 02/18/21 0430 02/19/21 0200  AST 119* 134* 99* 86* 62*  ALT 65* 89* 84* 83* 75*  ALKPHOS 137* 146* 179* 192* 190*  BILITOT 1.1 0.9 0.9 0.9 0.9  PROT 5.8* 5.1* 5.4* 6.1* 7.2  ALBUMIN 2.4* 1.9* 1.9* 2.1* 2.5*   Recent Labs  Lab 02/14/21 0742  LIPASE 24   No results for input(s): AMMONIA in the last 168 hours. Coagulation Profile: No results for input(s): INR, PROTIME in the last 168 hours. Cardiac Enzymes: No results for input(s): CKTOTAL, CKMB, CKMBINDEX, TROPONINI in the last 168 hours. BNP (last 3 results) No results for input(s): PROBNP in the last 8760 hours. HbA1C: No results for input(s): HGBA1C in the last 72 hours. CBG: Recent Labs  Lab 02/18/21 1101 02/18/21 1505 02/18/21 1913 02/18/21 2351 02/19/21 0307  GLUCAP 116* 134* 170* 147* 133*   Lipid Profile: Recent Labs    02/17/21 0416  TRIG 564*   Thyroid Function Tests: No results for input(s): TSH, T4TOTAL, FREET4, T3FREE, THYROIDAB in the last 72 hours. Anemia Panel: No results for input(s): VITAMINB12, FOLATE, FERRITIN, TIBC, IRON, RETICCTPCT in the last 72 hours. Sepsis Labs: Recent Labs  Lab 02/14/21 0742 02/14/21 1009 02/14/21 1421 02/14/21 2153 02/15/21 0303 02/16/21 0315  PROCALCITON  --   --  28.89  --  17.32 7.41  LATICACIDVEN 2.7* 2.1* 1.5 1.8  --   --     Recent Results (from the past 240 hour(s))  Culture, blood (Routine X 2) w Reflex to ID Panel     Status: None (Preliminary result)   Collection Time: 02/14/21  8:11 AM   Specimen: Urine, Clean Catch; Blood  Result Value Ref Range Status   Specimen Description URINE, CLEAN CATCH  Final   Special Requests URINE, CLEAN CATCH  Final   Culture   Final    NO GROWTH 4 DAYS Performed at [facility], [address] [place], [place] [address]     Report Status PENDING  Incomplete  Resp Panel by RT-PCR (Flu A&B, Covid) Nasopharyngeal Swab     Status: None   Collection Time: 02/14/21  9:00 AM   Specimen: Nasopharyngeal Swab; Nasopharyngeal(NP) swabs in vial transport medium  Result Value Ref Range Status   SARS Coronavirus 2 by RT PCR NEGATIVE NEGATIVE Final    Comment: (NOTE) SARS-CoV-2 target nucleic acids are NOT DETECTED.  The SARS-CoV-2 RNA is generally detectable in upper respiratory specimens during the acute phase of infection. The lowest concentration of SARS-CoV-2 viral copies this assay can detect is 138 copies/mL. A negative result does not preclude SARS-Cov-2 infection and should not be used as the sole basis for treatment or other patient management decisions. A negative result may occur with  improper specimen collection/handling, submission of specimen other than nasopharyngeal swab, presence of viral mutation(s) within the areas targeted by this assay, and inadequate number of viral copies(<138 copies/mL). A negative result must be combined with clinical observations, patient history, and epidemiological information. The expected result is Negative.  Fact Sheet for Patients:  EntrepreneurPulse.com.au  Fact Sheet for Healthcare Providers:  IncredibleEmployment.be  This test is no t yet approved or cleared by the [place] FDA and  has been authorized for detection and/or diagnosis of SARS-CoV-2 by FDA under an Emergency Use Authorization (EUA). This EUA will remain  in effect (meaning this test can be used) for the duration of the COVID-19 declaration under Section  564(b)(1) of the Act, 21 U.S.C.section 360bbb-3(b)(1), unless the authorization is terminated  or revoked sooner.       Influenza A by PCR NEGATIVE NEGATIVE Final   Influenza B by PCR NEGATIVE NEGATIVE Final    Comment: (NOTE) The Xpert Xpress SARS-CoV-2/FLU/RSV plus assay is intended as an aid in the  diagnosis of influenza from Nasopharyngeal swab specimens and should not be used as a sole basis for treatment. Nasal washings and aspirates are unacceptable for Xpert Xpress SARS-CoV-2/FLU/RSV testing.  Fact Sheet for Patients: EntrepreneurPulse.com.au  Fact Sheet for Healthcare Providers: IncredibleEmployment.be  This test is not yet approved or cleared by the [place] FDA and has been authorized for detection and/or diagnosis of SARS-CoV-2 by FDA under an Emergency Use Authorization (EUA). This EUA will remain in effect (meaning this test can be used) for the duration of the COVID-19 declaration under Section 564(b)(1) of the Act, 21 U.S.C. section 360bbb-3(b)(1), unless the authorization is terminated or revoked.  Performed at [facility], [address] [place], [place] [address]   Culture, Respiratory w Gram Stain     Status: None   Collection Time: 02/14/21  9:15 AM   Specimen: SPU  Result Value Ref Range Status   Specimen Description   Final    SPUTUM Performed at [facility], [address] [place], [place] [address]    Special Requests   Final    NONE Performed at [facility], [address] [place], [place] [address]    Gram Stain   Final    MODERATE WBC PRESENT,BOTH PMN AND MONONUCLEAR MODERATE GRAM POSITIVE COCCI MODERATE GRAM NEGATIVE RODS Performed at [facility], [place] [address] [place], [place] [address]    Culture   Final    ABUNDANT STREPTOCOCCUS PNEUMONIAE FEW ESCHERICHIA COLI    Report Status 02/16/2021 FINAL  Final   Organism ID, Bacteria STREPTOCOCCUS PNEUMONIAE  Final   Organism ID, Bacteria ESCHERICHIA COLI  Final      Susceptibility   Escherichia coli - MIC*    AMPICILLIN >=32 RESISTANT Resistant     CEFAZOLIN <=4 SENSITIVE Sensitive     CEFEPIME <=0.12 SENSITIVE Sensitive     CEFTAZIDIME <=1 SENSITIVE Sensitive     CEFTRIAXONE <=0.25 SENSITIVE Sensitive     CIPROFLOXACIN <=0.25 SENSITIVE  Sensitive     GENTAMICIN >=16 RESISTANT Resistant     IMIPENEM <=0.25 SENSITIVE Sensitive     TRIMETH/SULFA >=320 RESISTANT Resistant     AMPICILLIN/SULBACTAM 16 INTERMEDIATE Intermediate     PIP/TAZO <=4 SENSITIVE Sensitive     * FEW ESCHERICHIA COLI   Streptococcus pneumoniae - MIC*    ERYTHROMYCIN >=8 RESISTANT Resistant     LEVOFLOXACIN 1 SENSITIVE Sensitive     VANCOMYCIN 0.5 SENSITIVE Sensitive     PENO - penicillin 0.12      PENICILLIN (non-meningitis) 0.12 SENSITIVE Sensitive     PENICILLIN (oral) 0.12 INTERMEDIATE Intermediate     CEFTRIAXONE (non-meningitis) 0.25 SENSITIVE Sensitive     * ABUNDANT STREPTOCOCCUS PNEUMONIAE  Culture, blood (Routine X 2) w Reflex to ID Panel     Status: None (Preliminary result)   Collection Time: 02/14/21 10:06 AM   Specimen: BLOOD LEFT HAND  Result Value Ref Range Status   Specimen Description   Final    BLOOD LEFT HAND BOTTLES DRAWN AEROBIC AND ANAEROBIC   Special Requests Blood Culture adequate volume  Final   Culture   Final    NO GROWTH 4 DAYS Performed at [facility], [address], [place], [place] [address]    Report Status PENDING  Incomplete  Culture, Respiratory w Gram Stain (tracheal aspirate)     Status: None   Collection Time: 02/14/21  2:04 PM   Specimen: Tracheal Aspirate; Respiratory  Result Value Ref Range Status   Specimen Description TRACHEAL ASPIRATE  Final   Special Requests Normal  Final   Gram Stain   Final    ABUNDANT WBC PRESENT,BOTH PMN AND MONONUCLEAR ABUNDANT GRAM POSITIVE COCCI RARE GRAM NEGATIVE RODS    Culture   Final    ABUNDANT STREPTOCOCCUS PNEUMONIAE FEW ESCHERICHIA COLI FEW STREPTOCOCCUS AGALACTIAE TESTING AGAINST S. AGALACTIAE NOT ROUTINELY PERFORMED DUE TO PREDICTABILITY OF AMP/PEN/VAN SUSCEPTIBILITY. Performed at [facility], [place] [address], [place], [place] [address]    Report Status 02/18/2021 FINAL  Final   Organism ID, Bacteria STREPTOCOCCUS PNEUMONIAE  Final   Organism ID,  Bacteria ESCHERICHIA COLI  Final      Susceptibility   Escherichia coli - MIC*    AMPICILLIN >=32 RESISTANT Resistant     CEFAZOLIN <=4 SENSITIVE Sensitive     CEFEPIME <=0.12 SENSITIVE Sensitive     CEFTAZIDIME <=1 SENSITIVE Sensitive     CEFTRIAXONE <=0.25 SENSITIVE Sensitive     CIPROFLOXACIN <=0.25 SENSITIVE Sensitive     GENTAMICIN >=16 RESISTANT Resistant     IMIPENEM <=0.25 SENSITIVE Sensitive     TRIMETH/SULFA >=320 RESISTANT Resistant     AMPICILLIN/SULBACTAM >=32 RESISTANT Resistant     PIP/TAZO <=4 SENSITIVE Sensitive     * FEW ESCHERICHIA COLI   Streptococcus pneumoniae - MIC*    ERYTHROMYCIN >=8 RESISTANT Resistant     LEVOFLOXACIN 1 SENSITIVE Sensitive     VANCOMYCIN 0.5 SENSITIVE Sensitive     PENICILLIN (meningitis) 0.12 RESISTANT Resistant     PENO - penicillin 0.12      PENICILLIN (non-meningitis) 0.12 SENSITIVE Sensitive     PENICILLIN (oral) 0.12 INTERMEDIATE Intermediate     CEFTRIAXONE (non-meningitis) 0.25 SENSITIVE Sensitive     CEFTRIAXONE (meningitis) 0.25 SENSITIVE Sensitive     * ABUNDANT STREPTOCOCCUS PNEUMONIAE  MRSA Next Gen by PCR, Nasal     Status: None   Collection Time: 02/14/21  2:04 PM   Specimen: Nasal Mucosa; Nasal Swab  Result Value Ref Range Status   MRSA by PCR Next Gen NOT DETECTED NOT DETECTED Final    Comment: (NOTE) The GeneXpert MRSA Assay (FDA approved for NASAL specimens only), is one component of a comprehensive MRSA colonization surveillance program. It is not intended to diagnose MRSA infection nor to guide or monitor treatment for MRSA infections. Test performance is not FDA approved in patients less than 33 years old. Performed at [facility], [place] [address], [place], [place] [address]   Culture, Respiratory w Gram Stain     Status: None   Collection Time: 02/15/21 11:15 AM   Specimen: Bronchoalveolar Lavage; Respiratory  Result Value Ref Range Status   Specimen Description BRONCHIAL ALVEOLAR LAVAGE  Final    Special Requests NONE  Final   Gram Stain   Final    ABUNDANT WBC PRESENT, PREDOMINANTLY PMN ABUNDANT GRAM POSITIVE COCCI    Culture   Final    FEW STREPTOCOCCUS PNEUMONIAE RARE ESCHERICHIA COLI SUSCEPTIBILITIES PERFORMED ON PREVIOUS CULTURE WITHIN THE LAST 5 DAYS. FEW STREPTOCOCCUS AGALACTIAE TESTING AGAINST S. AGALACTIAE NOT ROUTINELY PERFORMED DUE TO PREDICTABILITY OF AMP/PEN/VAN SUSCEPTIBILITY. Performed at [facility], [place] [address] [place], [place] [address]    Report  Status 02/18/2021 FINAL  Final         Radiology Studies: DG CHEST PORT 1 VIEW  Result Date: 02/17/2021 CLINICAL DATA:  ETT placement EXAM: PORTABLE CHEST 1 VIEW COMPARISON:  02/17/2021 FINDINGS: Endotracheal tube with the tip 4 cm above the carina. Nasogastric tube coursing below the diaphragm. Bilateral interstitial and alveolar airspace opacities which may reflect multilobar pneumonia versus pulmonary edema. Small right pleural effusion. No pneumothorax. Stable cardiomediastinal silhouette. No acute osseous abnormality. IMPRESSION: 1. Endotracheal tube with the tip 4 cm above the carina. 2. Bilateral interstitial and alveolar airspace opacities which may reflect multilobar pneumonia versus pulmonary edema. Electronically Signed   By: Kathreen Devoid M.D.   On: 02/17/2021 09:56   DG CHEST PORT 1 VIEW  Result Date: 02/17/2021 CLINICAL DATA:  Acute respiratory failure. EXAM: PORTABLE CHEST 1 VIEW COMPARISON:  February 15, 2021. FINDINGS: Stable cardiomediastinal silhouette. Endotracheal and nasogastric tubes are unchanged in position. Right-sided PICC line is unchanged. Stable bibasilar opacities are noted concerning for pneumonia or atelectasis with associated pleural effusions, right greater than left. Bony thorax is unremarkable. IMPRESSION: Stable support apparatus.  Stable bibasilar opacities. Electronically Signed   By: Marijo Conception M.D.   On: 02/17/2021 08:18   DG Abd Portable 1V  Result Date:  02/17/2021 CLINICAL DATA:  Nasogastric tube placement. EXAM: PORTABLE ABDOMEN - 1 VIEW COMPARISON:  02/14/2021. FINDINGS: Nasogastric tube terminates in the descending duodenum. IMPRESSION: Nasogastric tube terminates in the descending duodenum. Electronically Signed   By: Lorin Picket M.D.   On: 02/17/2021 09:55        Scheduled Meds:  amitriptyline  75 mg Oral QHS   amphetamine-dextroamphetamine  20 mg Oral Q breakfast   Chlorhexidine Gluconate Cloth  6 each Topical Daily   citalopram  20 mg Oral Daily   clonazepam  0.5 mg Oral BID   docusate  100 mg Oral BID   enoxaparin (LOVENOX) injection  40 mg Subcutaneous Q24H   guaiFENesin  10 mL Oral Q6H   hydrochlorothiazide  12.5 mg Oral Daily   insulin aspart  0-15 Units Subcutaneous Q4H   lisinopril  20 mg Oral Daily   mouth rinse  15 mL Mouth Rinse BID   pantoprazole  40 mg Oral Daily   polyethylene glycol  17 g Oral Daily   senna-docusate  1 tablet Oral BID   sodium chloride flush  3 mL Intracatheter Q12H   sucralfate  1 g Oral BID   vortioxetine HBr  10 mg Oral Daily   Continuous Infusions:  sodium chloride 10 mL/hr at 02/19/21 0600   amiodarone 30 mg/hr (02/19/21 0600)   amiodarone     levofloxacin (LEVAQUIN) IV Stopped (02/18/21 1044)    LOS: 5 days   Time spent: 56mn  Jahvier Aldea C Yeshaya Vath, DO [facility]  If 7PM-7AM, please contact night-coverage [email or web address]  02/19/2021, 6:59 AM

## 2021-02-19 NOTE — Progress Notes (Signed)
eLink Physician-Brief Progress Note Patient Name: [name] DOB: [birth year] MRN: [id number]   Date of Service  02/19/2021  HPI/Events of Note  Patient in atrial fibrillation with RVR ( rate 130's) despite Amiodarone gtt.  eICU Interventions  PRN Beta blocker added for both blood pressure and heart rate control.        Thomasene Lot Ansley Mangiapane 02/19/2021, 12:08 AM

## 2021-02-19 NOTE — Evaluation (Signed)
Occupational Therapy Evaluation Patient Details Name: [name] MRN: [id number] DOB: [birth year] Today's Date: 02/19/2021   History of Present Illness Pt adm 2/3 with acute resp failure secondary to aspiration PNA and sepsis. Intubated in ED. Bronch 2/5 with foreign body. Self extubated 2/6. PMH - Leiden Factor V, HTN, chronic laryngitis, anxiety, chronic pain.   Clinical Impression   PTA patient was living with family in a private residence and was grossly I with ADLs/IADLs without AD. Patient currently functioning below baseline demonstrating observed ADLs with Min to Mod A. Patient also requiring Min guard to Min A for functional transfers and mobility with at least unilateral UE support. Patient also limited by deficits listed below including generalized weakness, decreased activity tolerance and decreased cardiopulmonary status requiring 8L HFNC and would benefit from continued acute OT services in prep for safe d/c home. OT will continue to follow acutely.       Recommendations for follow up therapy are one component of a multi-disciplinary discharge planning process, led by the attending physician.  Recommendations may be updated based on patient status, additional functional criteria and insurance authorization.   Follow Up Recommendations  Home health OT    Assistance Recommended at Discharge Frequent or constant Supervision/Assistance  Patient can return home with the following A little help with bathing/dressing/bathroom;Assist for transportation    Functional Status Assessment  Patient has had a recent decline in their functional status and demonstrates the ability to make significant improvements in function in a reasonable and predictable amount of time.  Equipment Recommendations  BSC/3in1    Recommendations for Other Services       Precautions / Restrictions Precautions Precautions: Fall Restrictions Weight Bearing Restrictions: No      Mobility Bed  Mobility Overal bed mobility: Needs Assistance Bed Mobility: Supine to Sit     Supine to sit: Min assist     General bed mobility comments: Able to advance BLE toward EOB. Min A to elevate trunk with HOB slightly elevated.    Transfers Overall transfer level: Needs assistance Equipment used: 1 person hand held assist Transfers: Sit to/from Stand Sit to Stand: Min guard, Min assist           General transfer comment: Min guard from elevated surfaces and Min A for low surfaces. Cues for hand placement.      Balance Overall balance assessment: Needs assistance Sitting-balance support: No upper extremity supported, Feet supported Sitting balance-[name] Scale: Fair     Standing balance support: Bilateral upper extremity supported, During functional activity Standing balance-[name] Scale: Poor Standing balance comment: UE support and min guard for static standing                           ADL either performed or assessed with clinical judgement   ADL                                               Vision Baseline Vision/History: 0 No visual deficits Ability to See in Adequate Light: 0 Adequate Patient Visual Report: No change from baseline Vision Assessment?: No apparent visual deficits Additional Comments: 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 Able to correctly identify time on wall clock and correctly recites numbers/letters at the bottom of meal ticket. Able to navigate around obstacles in hallway without  difficulty.     Perception     Praxis      Pertinent Vitals/Pain Pain Assessment Pain Assessment: Faces Faces Pain Scale: Hurts a little bit Pain Location: Chest with coughing Pain Descriptors / Indicators: Sore Pain Intervention(s): Limited activity within patient's tolerance     Hand Dominance Right   Extremity/Trunk Assessment Upper Extremity Assessment Upper Extremity Assessment: Generalized  weakness   Lower Extremity Assessment Lower Extremity Assessment: Generalized weakness   Cervical / Trunk Assessment Cervical / Trunk Assessment: Normal   Communication Communication Communication: No difficulties   Cognition Arousal/Alertness: Awake/alert Behavior During Therapy: WFL for tasks assessed/performed Overall Cognitive Status: Difficult to assess                                 General Comments: Patient is difficult to understand. Able to state month and current year with increased time. When asked to state the month, patient initially states the day several times.     General Comments  8L O2 via [place]; SpO2 93-95% at rest and with light activity. HR in 80's-90's. BP elevated with SBP in 160's. Reports that she has not yet received morning meds.    Exercises     Shoulder Instructions      Home Living Family/patient expects to be discharged to:: Private residence Living Arrangements: Children;Spouse/significant other (son, son's wife, significant other) Available Help at Discharge: Family;Available 24 hours/day Type of Home: Mobile home Home Access: Stairs to enter Entrance Stairs-Number of Steps: 2 Entrance Stairs-Rails: Right Home Layout: One level               Home Equipment: None          Prior Functioning/Environment Prior Level of Function : Independent/Modified Independent             Mobility Comments: no assistive device ADLs Comments: I with ADLs; does not work or drive        OT Problem List: Decreased strength;Decreased activity tolerance;Impaired balance (sitting and/or standing);Decreased cognition;Decreased safety awareness;Decreased knowledge of use of DME or AE;Cardiopulmonary status limiting activity      OT Treatment/Interventions: Self-care/ADL training;Therapeutic exercise;Energy conservation;DME and/or AE instruction;Therapeutic activities;Cognitive remediation/compensation;Patient/family education;Balance  training    OT Goals(Current goals can be found in the care plan section) Acute Rehab OT Goals Patient Stated Goal: To get better. OT Goal Formulation: With patient Time For Goal Achievement: 03/05/21 Potential to Achieve Goals: Good ADL Goals Pt Will Perform Upper Body Dressing: with modified independence Pt Will Perform Lower Body Dressing: with modified independence;sit to/from stand Pt Will Transfer to Toilet: with modified independence;ambulating;regular height toilet Pt Will Perform Toileting - Clothing Manipulation and hygiene: with modified independence;sit to/from stand Pt Will Perform Tub/Shower Transfer: Tub transfer;Shower transfer;3 in 1 Additional ADL Goal #1: Patient will score <4/28 on SBT indicating improved cognition in prep for completion of ADLs/IADLs.  OT Frequency: Min 3X/week    Co-evaluation              AM-PAC OT "6 Clicks" Daily Activity     Outcome Measure Help from another person eating meals?: None Help from another person taking care of personal grooming?: A Little Help from another person toileting, which includes using toliet, bedpan, or urinal?: A Little Help from another person bathing (including washing, rinsing, drying)?: A Lot Help from another person to put on and taking off regular upper body clothing?: A Little Help from another person to put  on and taking off regular lower body clothing?: A Lot 6 Click Score: 17   End of Session Equipment Utilized During Treatment: Gait belt;Oxygen Nurse Communication: Mobility status  Activity Tolerance: Patient tolerated treatment well Patient left: in chair;with call bell/phone within reach  OT Visit Diagnosis: Unsteadiness on feet (R26.81);Muscle weakness (generalized) (M62.81);Other symptoms and signs involving cognitive function                Time: 3875-6433 OT Time Calculation (min): 25 min Charges:  OT General Charges $OT Visit: 1 Visit OT Evaluation $OT Eval Moderate Complexity: 1  Mod OT Treatments $Self Care/Home Management : 8-22 mins  Kandee Escalante H. OTR/L Supplemental OT, Department of rehab services [phone]  Alaina Donati R H. 02/19/2021, 7:54 AM

## 2021-02-20 ENCOUNTER — Encounter (HOSPITAL_COMMUNITY): Payer: Self-pay | Admitting: Internal Medicine

## 2021-02-20 DIAGNOSIS — Z20822 Contact with and (suspected) exposure to covid-19: Secondary | ICD-10-CM | POA: Diagnosis not present

## 2021-02-20 DIAGNOSIS — K831 Obstruction of bile duct: Secondary | ICD-10-CM | POA: Diagnosis not present

## 2021-02-20 DIAGNOSIS — J9601 Acute respiratory failure with hypoxia: Secondary | ICD-10-CM | POA: Diagnosis not present

## 2021-02-20 DIAGNOSIS — R6521 Severe sepsis with septic shock: Secondary | ICD-10-CM | POA: Diagnosis not present

## 2021-02-20 DIAGNOSIS — G928 Other toxic encephalopathy: Secondary | ICD-10-CM | POA: Diagnosis not present

## 2021-02-20 DIAGNOSIS — A419 Sepsis, unspecified organism: Secondary | ICD-10-CM | POA: Diagnosis not present

## 2021-02-20 DIAGNOSIS — N170 Acute kidney failure with tubular necrosis: Secondary | ICD-10-CM | POA: Diagnosis not present

## 2021-02-20 DIAGNOSIS — J9602 Acute respiratory failure with hypercapnia: Secondary | ICD-10-CM | POA: Diagnosis not present

## 2021-02-20 DIAGNOSIS — J690 Pneumonitis due to inhalation of food and vomit: Secondary | ICD-10-CM | POA: Diagnosis not present

## 2021-02-20 DIAGNOSIS — K7200 Acute and subacute hepatic failure without coma: Secondary | ICD-10-CM | POA: Diagnosis not present

## 2021-02-20 DIAGNOSIS — I10 Essential (primary) hypertension: Secondary | ICD-10-CM | POA: Diagnosis not present

## 2021-02-20 DIAGNOSIS — J90 Pleural effusion, not elsewhere classified: Secondary | ICD-10-CM | POA: Diagnosis not present

## 2021-02-20 LAB — CBC
HCT: 43.8 % (ref 36.0–46.0)
Hemoglobin: 15.2 g/dL — ABNORMAL HIGH (ref 12.0–15.0)
MCH: 32.6 pg (ref 26.0–34.0)
MCHC: 34.7 g/dL (ref 30.0–36.0)
MCV: 94 fL (ref 80.0–100.0)
Platelets: 226 10*3/uL (ref 150–400)
RBC: 4.66 MIL/uL (ref 3.87–5.11)
RDW: 12.6 % (ref 11.5–15.5)
WBC: 14.3 10*3/uL — ABNORMAL HIGH (ref 4.0–10.5)
nRBC: 0 % (ref 0.0–0.2)

## 2021-02-20 LAB — BASIC METABOLIC PANEL
Anion gap: 15 (ref 5–15)
BUN: 14 mg/dL (ref 6–20)
CO2: 31 mmol/L (ref 22–32)
Calcium: 9.2 mg/dL (ref 8.9–10.3)
Chloride: 92 mmol/L — ABNORMAL LOW (ref 98–111)
Creatinine, Ser: 0.87 mg/dL (ref 0.44–1.00)
GFR, Estimated: >60 mL/min (ref >60)
Glucose, Bld: 122 mg/dL — ABNORMAL HIGH (ref 70–99)
Potassium: 2.7 mmol/L — CL (ref 3.5–5.1)
Sodium: 138 mmol/L (ref 135–145)

## 2021-02-20 LAB — GLUCOSE, CAPILLARY
Glucose-Capillary: 119 mg/dL — ABNORMAL HIGH (ref 70–99)
Glucose-Capillary: 125 mg/dL — ABNORMAL HIGH (ref 70–99)
Glucose-Capillary: 161 mg/dL — ABNORMAL HIGH (ref 70–99)
Glucose-Capillary: 132 mg/dL — ABNORMAL HIGH (ref 70–99)
Glucose-Capillary: 146 mg/dL — ABNORMAL HIGH (ref 70–99)
Glucose-Capillary: 186 mg/dL — ABNORMAL HIGH (ref 70–99)

## 2021-02-20 LAB — MAGNESIUM: Magnesium: 2 mg/dL (ref 1.7–2.4)

## 2021-02-20 MED ORDER — METOPROLOL TARTRATE 5 MG/5ML IV SOLN
5.0000 mg | Freq: Once | INTRAVENOUS | Status: AC
  Administered 2021-02-20: 5 mg via INTRAVENOUS
  Filled 2021-02-20: qty 5

## 2021-02-20 MED ORDER — POTASSIUM CHLORIDE CRYS ER 20 MEQ PO TBCR
40.0000 meq | EXTENDED_RELEASE_TABLET | ORAL | Status: AC
  Administered 2021-02-20 (×2): 40 meq via ORAL
  Filled 2021-02-20 (×2): qty 2

## 2021-02-20 MED ORDER — METOPROLOL TARTRATE 5 MG/5ML IV SOLN
2.5000 mg | Freq: Four times a day (QID) | INTRAVENOUS | Status: DC | PRN
  Administered 2021-02-20 – 2021-02-21 (×2): 2.5 mg via INTRAVENOUS
  Filled 2021-02-20 (×2): qty 5

## 2021-02-20 NOTE — Progress Notes (Signed)
PROGRESS NOTE    [name]  FGH:[id number] DOB: April 27, 1970 DOA: 02/14/2021 PCP: Lemmie Evens, MD   Brief Narrative:  51 year old female found unresponsive the am of 2/3. CXR showed RLL airspace disease. Abx started. ABG obtained: 7.23/74/92/24 and subsequently intubated.  Under PCCM care through 02/19/2021 -found to have strep pneumonia as well as likely concurrent aspiration pneumonia.  2/3 admitted w/ acute resp failure 2/2 aspiration PNA and sepsis. IV Levaquin (anaphylaxis reported to PCN). Intubated in ER  2/4 PICC line placed, started on/off NE gtt for soft BP. + Strep pneumo urine antigen. Bronch thick mucus/food, unable to remove foreign body 2/5: Repeat Bronch, right lower lobe lateral segment with foreign body, could not be removed with suction.  2/6: Self extubated while on max Precedex infusion with fentanyl 2/7: Tolerating HFNC - transferred to [facility] service 2/8 -PICC line incidentally pulled overnight, otherwise continues to improve clinically 2/9 - Continues to improve with PT/OT - wean oxygen as tolerated, dispo planning ongoing  Assessment & Plan:   Acute Respiratory Failure with hypoxemia secondary to strep pneumo and aspiration pneumonia -Strep pneumonia sputum culture and strep pneumo urine antigen positive.   -Self-extubated on 2/6 and now on high flow nasal cannula - continue to wean oxygen as tolerated -Completed Levaquin - stop date 2/9   Transient A-fib with RVR, resolved  -Continue p.o. 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 - Low albumin suggest chronic problem. Hepatic steatosis on CT abdomen 2022. Cholecystectomy in 2013.History of elevated Alk phos in chart. Appears had a US abdomen schedule by PCP for later this month for chronic elevation GGT elevated consistent with hepatobiliary etiology.   P: - hepatitis panel nonreactive   Thrombocytopenia Factor 5 Leiden ,heterozygous  - Platelet count improving daily - Likely secondary to chronic liver dysfunction as above   Hx of Major depressive disorder with anxiety  and Chronic pain syndrome.   - Cont Celexa Elavil 68m qhs VT Trintellix 149md - continue 0.5 clonazepam for xanax - on Vyvanse at home, could not give per tube, equivalent adderall dose ongoing - Hold Lyrica for now (was on 200 mg tid)  - Hold Robaxin    Hx of GERD w/ h/o gastritis  -Continue PPI and sucralfate    HTN, essential Metabolic Alkalosis - Expect patient has OSA, BMI 43.  - Continue HCTZ/lisinopril - Needs outpatient sleep study per pulmonology    DVT prophylaxis: Lovenox Code Status: Full Family Communication: None present  Status is: Inpatient  Dispo: The patient is from: Home              Anticipated d/c is to: To be determined pending PT evaluation              Anticipated d/c date is: 48-72h              Patient currently not medically stable for discharge  Consultants:  PCCM  Procedures:  Intubation/extubation as above  Antimicrobials:  Levaquin through 02/20/2021  Subjective: No acute issues or events overnight denies nausea vomiting diarrhea constipation effusions or chest pain, working with PT today and appears to be doing quite well considering  Objective: Vitals:   02/19/21 1730 02/19/21 1745 02/19/21 1800 02/19/21 1947  BP:   (!) 158/88 (!) 169/93  Pulse: 91 92 92 94  Resp: (!) 23 17 (!) 22 16  Temp:  98.4 F (36.9 C)  TempSrc:    Oral  SpO2: 94% 95% 93% 93%  Weight:      Height:        Intake/Output Summary (Last 24 hours) at 02/20/2021 0719 Last data filed at 02/19/2021 2352 Gross per 24 hour  Intake 643.32 ml  Output 2900 ml  Net -2256.68 ml    Filed Weights   02/15/21 0329 02/16/21 0500 02/18/21 0400  Weight: 109.2 kg 109.4 kg 107.8 kg    Examination:  General:  Pleasantly resting in bed, No  acute distress. HEENT:  Normocephalic atraumatic.  Sclerae nonicteric, noninjected.  Extraocular movements intact bilaterally. Neck:  Without mass or deformity.  Trachea is midline. Lungs:  Clear to auscultate bilaterally without rhonchi, wheeze, or rales. Heart:  Regular rate and rhythm.  Without murmurs, rubs, or gallops. Abdomen:  Soft, nontender, nondistended.  Without guarding or rebound. Extremities: Without cyanosis, clubbing, edema, or obvious deformity. Vascular:  Dorsalis pedis and posterior tibial pulses palpable bilaterally. Skin:  Warm and dry, no erythema, no ulcerations.  Data Reviewed: I have personally reviewed following labs and imaging studies  CBC: Recent Labs  Lab 02/14/21 0742 02/14/21 1421 02/16/21 0315 02/17/21 0416 02/18/21 0430 02/19/21 0200 02/20/21 0642  WBC 18.9*   < > 7.1 6.8 9.4 15.6* 14.3*  NEUTROABS 11.9*  --  5.5 4.7 5.6 11.5*  --   HGB 15.5*   < > 12.6 12.9 13.1 15.7* 15.2*  HCT 45.9   < > 36.4 39.1 39.5 44.6 43.8  MCV 102.5*   < > 102.2* 102.9* 99.7 95.1 94.0  PLT 159   < > 87* 84* 114* 167 226   < > = values in this interval not displayed.    Basic Metabolic Panel: Recent Labs  Lab 02/14/21 1421 02/14/21 1515 02/14/21 2153 02/15/21 0303 02/15/21 0443 02/15/21 1648 02/16/21 0315 02/17/21 0416 02/18/21 0430 02/18/21 2003 02/19/21 0200  NA 138   < >  --  135   < >  --  137 138 140 137 135  K 5.3*   < >  --  4.7   < >  --  4.6 4.8 4.1 3.2* 3.7  CL 102  --   --  99  --   --  99 98 96* 91* 92*  CO2 25  --   --  28  --   --  31 32 34* 28 28  GLUCOSE 112*  --   --  156*  --   --  135* 142* 114* 239* 138*  BUN 58*  --   --  54*  --   --  34* _0 CREATININE 2.19*  --   --  1.87*  --   --  1.12* 0.83 0.77 0.74 0.71  CALCIUM 7.6*  --   --  7.6*  --   --  8.0* 8.1* 8.9 9.0 9.4  MG 1.6*  --  1.6* 1.7  --  1.9  --   --   --  1.6*  --   PHOS 6.4*  --  5.2* 3.8  --  3.1  --   --   --   --   --    < > = values in this interval not  displayed.    GFR: Estimated Creatinine Clearance: 97.2 mL/min (by C-G formula based on SCr of 0.71 mg/dL). Liver Function Tests: Recent Labs  Lab 02/14/21 1421 02/16/21 0315 02/17/21 0416 02/18/21 0430 02/19/21 0200  AST 119* 134* 99* 86* 62*  ALT 65* 89* 84* 83* 75*  ALKPHOS 137* 146* 179* 192* 190*  BILITOT 1.1 0.9 0.9 0.9 0.9  PROT 5.8* 5.1* 5.4* 6.1* 7.2  ALBUMIN 2.4* 1.9* 1.9* 2.1* 2.5*    Recent Labs  Lab 02/14/21 0742  LIPASE 24    No results for input(s): AMMONIA in the last 168 hours. Coagulation Profile: No results for input(s): INR, PROTIME in the last 168 hours. Cardiac Enzymes: No results for input(s): CKTOTAL, CKMB, CKMBINDEX, TROPONINI in the last 168 hours. BNP (last 3 results) No results for input(s): PROBNP in the last 8760 hours. HbA1C: No results for input(s): HGBA1C in the last 72 hours. CBG: Recent Labs  Lab 02/19/21 1517 02/19/21 1939 02/20/21 0001 02/20/21 0340 02/20/21 0701  GLUCAP 142* 142* 161* 119* 125*    Lipid Profile: No results for input(s): CHOL, HDL, LDLCALC, TRIG, CHOLHDL, LDLDIRECT in the last 72 hours.  Thyroid Function Tests: No results for input(s): TSH, T4TOTAL, FREET4, T3FREE, THYROIDAB in the last 72 hours. Anemia Panel: No results for input(s): VITAMINB12, FOLATE, FERRITIN, TIBC, IRON, RETICCTPCT in the last 72 hours. Sepsis Labs: Recent Labs  Lab 02/14/21 0742 02/14/21 1009 02/14/21 1421 02/14/21 2153 02/15/21 0303 02/16/21 0315  PROCALCITON  --   --  28.89  --  17.32 7.41  LATICACIDVEN 2.7* 2.1* 1.5 1.8  --   --      Recent Results (from the past 240 hour(s))  Culture, blood (Routine X 2) w Reflex to ID Panel     Status: None   Collection Time: 02/14/21  8:11 AM   Specimen: Urine, Clean Catch; Blood  Result Value Ref Range Status   Specimen Description URINE, CLEAN CATCH  Final   Special Requests URINE, CLEAN CATCH  Final   Culture   Final    NO GROWTH 5 DAYS Performed at [facility],  [address] [place], [place] [address]    Report Status 02/19/2021 FINAL  Final  Resp Panel by RT-PCR (Flu A&B, Covid) Nasopharyngeal Swab     Status: None   Collection Time: 02/14/21  9:00 AM   Specimen: Nasopharyngeal Swab; Nasopharyngeal(NP) swabs in vial transport medium  Result Value Ref Range Status   SARS Coronavirus 2 by RT PCR NEGATIVE NEGATIVE Final    Comment: (NOTE) SARS-CoV-2 target nucleic acids are NOT DETECTED.  The SARS-CoV-2 RNA is generally detectable in upper respiratory specimens during the acute phase of infection. The lowest concentration of SARS-CoV-2 viral copies this assay can detect is 138 copies/mL. A negative result does not preclude SARS-Cov-2 infection and should not be used as the sole basis for treatment or other patient management decisions. A negative result may occur with  improper specimen collection/handling, submission of specimen other than nasopharyngeal swab, presence of viral mutation(s) within the areas targeted by this assay, and inadequate number of viral copies(<138 copies/mL). A negative result must be combined with clinical observations, patient history, and epidemiological information. The expected result is Negative.  Fact Sheet for Patients:  EntrepreneurPulse.com.au  Fact Sheet for Healthcare Providers:  IncredibleEmployment.be  This test is no t yet approved or cleared by the [place] FDA and  has been authorized for detection and/or diagnosis of SARS-CoV-2 by FDA under an Emergency Use Authorization (EUA). This EUA will remain  in effect (meaning this test can be used) for the duration of the COVID-19 declaration under Section 564(b)(1) of the Act, 21 U.S.C.section 360bbb-3(b)(1), unless the authorization is terminated  or revoked  sooner.       Influenza A by PCR NEGATIVE NEGATIVE Final   Influenza B by PCR NEGATIVE NEGATIVE Final    Comment: (NOTE) The Xpert Xpress SARS-CoV-2/FLU/RSV  plus assay is intended as an aid in the diagnosis of influenza from Nasopharyngeal swab specimens and should not be used as a sole basis for treatment. Nasal washings and aspirates are unacceptable for Xpert Xpress SARS-CoV-2/FLU/RSV testing.  Fact Sheet for Patients: EntrepreneurPulse.com.au  Fact Sheet for Healthcare Providers: IncredibleEmployment.be  This test is not yet approved or cleared by the [place] FDA and has been authorized for detection and/or diagnosis of SARS-CoV-2 by FDA under an Emergency Use Authorization (EUA). This EUA will remain in effect (meaning this test can be used) for the duration of the COVID-19 declaration under Section 564(b)(1) of the Act, 21 U.S.C. section 360bbb-3(b)(1), unless the authorization is terminated or revoked.  Performed at [facility], [address] [place] [address], [place], [place] [address]   Culture, Respiratory w Gram Stain     Status: None   Collection Time: 02/14/21  9:15 AM   Specimen: SPU  Result Value Ref Range Status   Specimen Description   Final    SPUTUM Performed at [facility], [address], [place], [place] [address]    Special Requests   Final    NONE Performed at [facility], [address] [place], [place] [address]    Gram Stain   Final    MODERATE WBC PRESENT,BOTH PMN AND MONONUCLEAR MODERATE GRAM POSITIVE COCCI MODERATE GRAM NEGATIVE RODS Performed at [facility], [place] [address], [place], [place] [address]    Culture   Final    ABUNDANT STREPTOCOCCUS PNEUMONIAE FEW ESCHERICHIA COLI    Report Status 02/16/2021 FINAL  Final   Organism ID, Bacteria STREPTOCOCCUS PNEUMONIAE  Final   Organism ID, Bacteria ESCHERICHIA COLI  Final      Susceptibility   Escherichia coli - MIC*    AMPICILLIN >=32 RESISTANT Resistant     CEFAZOLIN <=4 SENSITIVE Sensitive     CEFEPIME <=0.12 SENSITIVE Sensitive     CEFTAZIDIME <=1 SENSITIVE Sensitive     CEFTRIAXONE <=0.25 SENSITIVE  Sensitive     CIPROFLOXACIN <=0.25 SENSITIVE Sensitive     GENTAMICIN >=16 RESISTANT Resistant     IMIPENEM <=0.25 SENSITIVE Sensitive     TRIMETH/SULFA >=320 RESISTANT Resistant     AMPICILLIN/SULBACTAM 16 INTERMEDIATE Intermediate     PIP/TAZO <=4 SENSITIVE Sensitive     * FEW ESCHERICHIA COLI   Streptococcus pneumoniae - MIC*    ERYTHROMYCIN >=8 RESISTANT Resistant     LEVOFLOXACIN 1 SENSITIVE Sensitive     VANCOMYCIN 0.5 SENSITIVE Sensitive     PENO - penicillin 0.12      PENICILLIN (non-meningitis) 0.12 SENSITIVE Sensitive     PENICILLIN (oral) 0.12 INTERMEDIATE Intermediate     CEFTRIAXONE (non-meningitis) 0.25 SENSITIVE Sensitive     * ABUNDANT STREPTOCOCCUS PNEUMONIAE  Culture, blood (Routine X 2) w Reflex to ID Panel     Status: None   Collection Time: 02/14/21 10:06 AM   Specimen: BLOOD LEFT HAND  Result Value Ref Range Status   Specimen Description   Final    BLOOD LEFT HAND BOTTLES DRAWN AEROBIC AND ANAEROBIC   Special Requests Blood Culture adequate volume  Final   Culture   Final    NO GROWTH 5 DAYS Performed at [facility], [address], [place], [place] [address]    Report Status 02/19/2021 FINAL  Final  Culture, Respiratory w Gram Stain (tracheal aspirate)     Status: None   Collection Time: 02/14/21  2:04 PM   Specimen: Tracheal Aspirate; Respiratory  Result Value Ref Range Status   Specimen Description TRACHEAL ASPIRATE  Final   Special Requests Normal  Final   Gram Stain   Final    ABUNDANT WBC PRESENT,BOTH PMN AND MONONUCLEAR ABUNDANT GRAM POSITIVE COCCI RARE GRAM NEGATIVE RODS    Culture   Final    ABUNDANT STREPTOCOCCUS PNEUMONIAE FEW ESCHERICHIA COLI FEW STREPTOCOCCUS AGALACTIAE TESTING AGAINST S. AGALACTIAE NOT ROUTINELY PERFORMED DUE TO PREDICTABILITY OF AMP/PEN/VAN SUSCEPTIBILITY. Performed at [facility], [place] [address] [place], [place] [address]    Report Status 02/18/2021 FINAL  Final   Organism ID, Bacteria STREPTOCOCCUS  PNEUMONIAE  Final   Organism ID, Bacteria ESCHERICHIA COLI  Final      Susceptibility   Escherichia coli - MIC*    AMPICILLIN >=32 RESISTANT Resistant     CEFAZOLIN <=4 SENSITIVE Sensitive     CEFEPIME <=0.12 SENSITIVE Sensitive     CEFTAZIDIME <=1 SENSITIVE Sensitive     CEFTRIAXONE <=0.25 SENSITIVE Sensitive     CIPROFLOXACIN <=0.25 SENSITIVE Sensitive     GENTAMICIN >=16 RESISTANT Resistant     IMIPENEM <=0.25 SENSITIVE Sensitive     TRIMETH/SULFA >=320 RESISTANT Resistant     AMPICILLIN/SULBACTAM >=32 RESISTANT Resistant     PIP/TAZO <=4 SENSITIVE Sensitive     * FEW ESCHERICHIA COLI   Streptococcus pneumoniae - MIC*    ERYTHROMYCIN >=8 RESISTANT Resistant     LEVOFLOXACIN 1 SENSITIVE Sensitive     VANCOMYCIN 0.5 SENSITIVE Sensitive     PENICILLIN (meningitis) 0.12 RESISTANT Resistant     PENO - penicillin 0.12      PENICILLIN (non-meningitis) 0.12 SENSITIVE Sensitive     PENICILLIN (oral) 0.12 INTERMEDIATE Intermediate     CEFTRIAXONE (non-meningitis) 0.25 SENSITIVE Sensitive     CEFTRIAXONE (meningitis) 0.25 SENSITIVE Sensitive     * ABUNDANT STREPTOCOCCUS PNEUMONIAE  MRSA Next Gen by PCR, Nasal     Status: None   Collection Time: 02/14/21  2:04 PM   Specimen: Nasal Mucosa; Nasal Swab  Result Value Ref Range Status   MRSA by PCR Next Gen NOT DETECTED NOT DETECTED Final    Comment: (NOTE) The GeneXpert MRSA Assay (FDA approved for NASAL specimens only), is one component of a comprehensive MRSA colonization surveillance program. It is not intended to diagnose MRSA infection nor to guide or monitor treatment for MRSA infections. Test performance is not FDA approved in patients less than 26 years old. Performed at [facility], [place] [address], [place], [place] [address]   Culture, Respiratory w Gram Stain     Status: None   Collection Time: 02/15/21 11:15 AM   Specimen: Bronchoalveolar Lavage; Respiratory  Result Value Ref Range Status   Specimen Description  BRONCHIAL ALVEOLAR LAVAGE  Final   Special Requests NONE  Final   Gram Stain   Final    ABUNDANT WBC PRESENT, PREDOMINANTLY PMN ABUNDANT GRAM POSITIVE COCCI    Culture   Final    FEW STREPTOCOCCUS PNEUMONIAE RARE ESCHERICHIA COLI SUSCEPTIBILITIES PERFORMED ON PREVIOUS CULTURE WITHIN THE LAST 5 DAYS. FEW STREPTOCOCCUS AGALACTIAE TESTING AGAINST S. AGALACTIAE NOT ROUTINELY PERFORMED DUE TO PREDICTABILITY OF AMP/PEN/VAN SUSCEPTIBILITY. Performed at [facility], [place] [address], [place], [place] [address]    Report Status 02/18/2021 FINAL  Final          Radiology Studies:  No results found.      Scheduled Meds:  amiodarone  200 mg Oral Daily   amitriptyline  75 mg Oral QHS   amphetamine-dextroamphetamine  20 mg Oral Q breakfast   Chlorhexidine Gluconate Cloth  6 each Topical Daily   citalopram  20 mg Oral Daily   clonazepam  0.5 mg Oral BID   docusate  100 mg Oral BID   enoxaparin (LOVENOX) injection  40 mg Subcutaneous Q24H   guaiFENesin  10 mL Oral Q6H   hydrochlorothiazide  12.5 mg Oral Daily   insulin aspart  0-15 Units Subcutaneous Q4H   lisinopril  20 mg Oral Daily   mouth rinse  15 mL Mouth Rinse BID   nicotine  21 mg Transdermal Daily   pantoprazole  40 mg Oral Daily   polyethylene glycol  17 g Oral Daily   senna-docusate  1 tablet Oral BID   sodium chloride flush  3 mL Intracatheter Q12H   sucralfate  1 g Oral BID   vortioxetine HBr  10 mg Oral Daily   Continuous Infusions:  sodium chloride Stopped (02/19/21 2333)   levofloxacin (LEVAQUIN) IV Stopped (02/19/21 0915)    LOS: 6 days   Time spent: 11mn  Dennies Coate C Stephannie Broner, DO [facility]  If 7PM-7AM, please contact night-coverage [email or web address]  02/20/2021, 7:19 AM

## 2021-02-20 NOTE — Progress Notes (Signed)
Physical Therapy Treatment Patient Details Name: [name] MRN: [id number] DOB: 07-27-1970 Today's Date: 02/20/2021   History of Present Illness Pt adm 2/3 with acute resp failure secondary to aspiration PNA and sepsis. Intubated in ED. Bronch 2/5 with foreign body. Self extubated 2/6. PMH - Leiden Factor V, HTN, chronic laryngitis, anxiety, chronic pain.    PT Comments    Pt making steady progress with mobility. Pt had BM in the bed when I entered. Per nurse this happened several times yesterday. Instructed pt she needed to call for assist to get to bathroom or [facility] when she felt she might have a BM. Recommend rollator for home.    Recommendations for follow up therapy are one component of a multi-disciplinary discharge planning process, led by the attending physician.  Recommendations may be updated based on patient status, additional functional criteria and insurance authorization.  Follow Up Recommendations  Home health PT     Assistance Recommended at Discharge Frequent or constant Supervision/Assistance  Patient can return home with the following A little help with walking and/or transfers;Help with stairs or ramp for entrance   Equipment Recommendations  Rollator (4 wheels)    Recommendations for Other Services       Precautions / Restrictions Precautions Precautions: Fall     Mobility  Bed Mobility Overal bed mobility: Needs Assistance Bed Mobility: Supine to Sit     Supine to sit: Min assist     General bed mobility comments: Assist to elevate trunk into sitting.    Transfers Overall transfer level: Needs assistance Equipment used: Rollator (4 wheels) Transfers: Sit to/from Stand Sit to Stand: Min guard           General transfer comment: Assist for safety    Ambulation/Gait Ambulation/Gait assistance: Min guard Gait Distance (Feet): 140 Feet Assistive device: Rollator (4 wheels) Gait Pattern/deviations: Step-through pattern, Decreased stride  length Gait velocity: decr Gait velocity interpretation: 1.31 - 2.62 ft/sec, indicative of limited community ambulator   General Gait Details: Assist for safety   Stairs             Wheelchair Mobility    Modified Rankin (Stroke Patients Only)       Balance Overall balance assessment: Needs assistance Sitting-balance support: No upper extremity supported, Feet supported Sitting balance-[name] Scale: Fair     Standing balance support: Bilateral upper extremity supported, During functional activity Standing balance-[name] Scale: Poor Standing balance comment: UE support and supervision for static standing                            Cognition Arousal/Alertness: Awake/alert Behavior During Therapy: WFL for tasks assessed/performed Overall Cognitive Status: Within Functional Limits for tasks assessed                                 General Comments: Pt with rambling speech which I suspect is baseline        Exercises      General Comments General comments (skin integrity, edema, etc.): O2 at 5L at rest. Placed on 6L for amb due to tank doesn't have 5L option. SpO2 87-90% amb on 6L. HR 130's with activity      Pertinent Vitals/Pain Pain Assessment Pain Assessment: No/denies pain    Home Living  Prior Function            PT Goals (current goals can now be found in the care plan section) Acute Rehab PT Goals Patient Stated Goal: return home Progress towards PT goals: Progressing toward goals    Frequency    Min 3X/week      PT Plan Current plan remains appropriate    Co-evaluation              AM-PAC PT "6 Clicks" Mobility   Outcome Measure  Help needed turning from your back to your side while in a flat bed without using bedrails?: A Little Help needed moving from lying on your back to sitting on the side of a flat bed without using bedrails?: A Little Help needed moving to and  from a bed to a chair (including a wheelchair)?: A Little Help needed standing up from a chair using your arms (e.g., wheelchair or bedside chair)?: A Little Help needed to walk in hospital room?: A Little Help needed climbing 3-5 steps with a railing? : A Little 6 Click Score: 18    End of Session Equipment Utilized During Treatment: Oxygen Activity Tolerance: Patient tolerated treatment well Patient left: with call bell/phone within reach;in chair Nurse Communication: Mobility status PT Visit Diagnosis: Unsteadiness on feet (R26.81);Other abnormalities of gait and mobility (R26.89);Muscle weakness (generalized) (M62.81)     Time: [id number] PT Time Calculation (min) (ACUTE ONLY): 30 min  Charges:  $Gait Training: 23-37 mins                     [place] Pager [phone] Office [place] 02/20/2021, 12:35 PM

## 2021-02-20 NOTE — Progress Notes (Signed)
NEW ADMISSION NOTE New Admission Note:   Arrival Method: Wheelchair Mental Orientation: Alert and oriented x4 Telemetry:ST Assessment: Completed Skin: Scattered bruises otherwise skin intact as witnessed with Carla R.N. IV:LT FA. Pain:Denies Tubes:None Safety Measures: Safety Fall Prevention Plan has been given, discussed and signed Admission: Completed [facility] Orientation: Patient has been orientated to the room, unit and staff.  Family:  Orders have been reviewed and implemented. Will continue to monitor the patient. Call light has been placed within reach and bed alarm has been activated.   [place], Zenon Mayo, RN

## 2021-02-21 DIAGNOSIS — G894 Chronic pain syndrome: Secondary | ICD-10-CM

## 2021-02-21 LAB — GLUCOSE, CAPILLARY
Glucose-Capillary: 150 mg/dL — ABNORMAL HIGH (ref 70–99)
Glucose-Capillary: 162 mg/dL — ABNORMAL HIGH (ref 70–99)
Glucose-Capillary: 172 mg/dL — ABNORMAL HIGH (ref 70–99)
Glucose-Capillary: 184 mg/dL — ABNORMAL HIGH (ref 70–99)

## 2021-02-21 LAB — CREATININE, SERUM
Creatinine, Ser: 0.97 mg/dL (ref 0.44–1.00)
GFR, Estimated: >60 mL/min (ref >60)

## 2021-02-21 LAB — POTASSIUM: Potassium: 3.2 mmol/L — ABNORMAL LOW (ref 3.5–5.1)

## 2021-02-21 MED ORDER — CARVEDILOL 3.125 MG PO TABS
3.1250 mg | ORAL_TABLET | Freq: Two times a day (BID) | ORAL | Status: DC
  Administered 2021-02-21: 3.125 mg via ORAL
  Filled 2021-02-21: qty 1

## 2021-02-21 MED ORDER — POTASSIUM CHLORIDE CRYS ER 20 MEQ PO TBCR
20.0000 meq | EXTENDED_RELEASE_TABLET | Freq: Two times a day (BID) | ORAL | Status: DC
  Administered 2021-02-21: 20 meq via ORAL
  Filled 2021-02-21: qty 1

## 2021-02-21 MED ORDER — CARVEDILOL 6.25 MG PO TABS: 6.2500 mg | ORAL_TABLET | Freq: Two times a day (BID) | ORAL | Status: DC

## 2021-02-21 NOTE — Discharge Summary (Signed)
°  Physician Discharge Summary   Patient: [name] MRN: [id number] DOB: 1970-08-22  Admit date:     02/14/2021  Discharge date: 02/21/21  Discharge Physician: Azucena Fallen   PCP: Gareth Morgan, MD   Discharge Diagnoses: Principal Problem:   Respiratory failure with hypoxia and hypercapnia (HCC) Active Problems:   Depression with anxiety   Chronic pain syndrome   Factor V Leiden mutation (HCC)   Aspiration pneumonia of right lower lobe due to vomit (HCC)   Sepsis with acute organ dysfunction (HCC)   Toxic metabolic encephalopathy   AKI (acute kidney injury) (HCC)   Alteration in electrolyte and fluid balance   Lactic acidosis   Elevated LFTs   H/O gastroesophageal reflux (GERD)   Hyperlipidemia   Acute respiratory failure with hypercapnia (HCC)  Resolved Problems:   * No resolved hospital problems. [facility] Course: PATIENT LEFT AMA 02/21/21 despite lengthy discussion at bedside about risks of leaving prior to completing medical therapy given ongoing hypoxia, afib with RVR, and initial presentation here being found down requiring intubation and ventilator support due to respiratory failure of unknown etiology.   See progress note from earlier today for complete hospital course  Assessment and Plan: No notes have been filed under this hospital service. Service: Hospitalist  Discharge time spent: greater than 30 minutes.  Signed: Carma Leaven DO  [facility] 02/21/2021

## 2021-02-21 NOTE — Progress Notes (Addendum)
Occupational Therapy Treatment Patient Details Name: [name] MRN: [id number] DOB: [birth year] Today's Date: 02/21/2021   History of present illness Pt adm 2/3 with acute resp failure secondary to aspiration PNA and sepsis. Intubated in ED. Bronch 2/5 with foreign body. Self extubated 2/6. PMH - Leiden Factor V, HTN, chronic laryngitis, anxiety, chronic pain.   OT comments  Pt with gradual progression towards OT goals though continues to require consistent safety/sequencing cues to safely complete tasks. Pt with poor insight into deficits. Pt overall Min A for bathroom mobility using RW with posterior sway, Min A for LB ADLs including toileting hygiene this AM. Pt notably SOB with minimal activities with education emphasizing pursed lip breathing, rest breaks and monitoring of symptoms. Pt adamant on plan to DC home rather than rehab though educated on need for 24/7 assist at DC for ADLs/IADLs to maximize safety with pt reporting she has this support needed at DC. If pt does not have this support, would recommend AIR.   HR 150s at rest, up to 193bpm with bathroom tasks. SpO2 89% on RA at rest/sitting EOB, desats to 84% on RA with in room mobility   Addendum: **Based on later conversation with CM, family able to provide support and provide transportation to OP therapies with preference for this, so updating recs to OP OT.**   Recommendations for follow up therapy are one component of a multi-disciplinary discharge planning process, led by the attending physician.  Recommendations may be updated based on patient status, additional functional criteria and insurance authorization.    Follow Up Recommendations  Home health OT (based on later conversation with CM, family able to provide support and provide transportation to OP therapies with preference for this.)   Assistance Recommended at Discharge Frequent or constant Supervision/Assistance  Patient can return home with the following  A  little help with walking and/or transfers;A little help with bathing/dressing/bathroom;Assistance with cooking/housework;Direct supervision/assist for financial management;Direct supervision/assist for medications management;Assist for transportation;Help with stairs or ramp for entrance   Equipment Recommendations  BSC/3in1;Other (comment) (Rolling walker)    Recommendations for Other Services      Precautions / Restrictions Precautions Precautions: Fall;Other (comment) Precaution Comments: monitor O2 (does not wear at baseline; goal 88% and above), HR (very tachy; not HR parameters per MD) Restrictions Weight Bearing Restrictions: No       Mobility Bed Mobility Overal bed mobility: Needs Assistance Bed Mobility: Supine to Sit     Supine to sit: Min assist     General bed mobility comments: increased time, poor balance in attempting to sit up    Transfers Overall transfer level: Needs assistance Equipment used: Rolling walker (2 wheels) Transfers: Sit to/from Stand Sit to Stand: Min guard           General transfer comment: stood initially without assist (despite cues to wait for assist), posterior sway in standing though no assist needed in power up. Min A for turning with RW     Balance Overall balance assessment: Needs assistance Sitting-balance support: No upper extremity supported, Feet supported Sitting balance-[name] Scale: Fair     Standing balance support: Bilateral upper extremity supported, During functional activity Standing balance-[name] Scale: Poor Standing balance comment: UE support in standing, especially for dynamic tasks                           ADL either performed or assessed with clinical judgement   ADL Overall ADL's : Needs assistance/impaired  Grooming: Set up;Sitting;Wash/dry hands               Lower Body Dressing: Min guard;Sitting/lateral leans Lower Body Dressing Details (indicate cue type and reason): to  don socks, frequent posterior LOB sitting EOB with task Toilet Transfer: Minimal assistance;Ambulation;BSC/3in1;Regular Toilet;Rolling walker (2 wheels) Toilet Transfer Details (indicate cue type and reason): Min A for intermittent posterior LOB, cues for RW manueving Toileting- Clothing Manipulation and Hygiene: Minimal assistance;Sit to/from stand Toileting - Clothing Manipulation Details (indicate cue type and reason): able to manage underwear but required assist for thoroughness of posterior hygiene       General ADL Comments: Impaired by poor safety awareness, tachycardiac and SOB with activity    Extremity/Trunk Assessment Upper Extremity Assessment Upper Extremity Assessment: Overall WFL for tasks assessed   Lower Extremity Assessment Lower Extremity Assessment: Defer to PT evaluation        Vision   Vision Assessment?: No apparent visual deficits   Perception     Praxis      Cognition Arousal/Alertness: Awake/alert Behavior During Therapy: Impulsive Overall Cognitive Status: Impaired/Different from baseline Area of Impairment: Safety/judgement, Awareness, Problem solving                         Safety/Judgement: Decreased awareness of deficits, Decreased awareness of safety Awareness: Emergent Problem Solving: Slow processing, Requires verbal cues, Requires tactile cues General Comments: Pt with poor insight into deficits, medical conditions, and requires significant cues for safety awareness. difficulty following conversation at times, unsure if baseline behaviors        Exercises      Shoulder Instructions       General Comments Received on RA, 89% sutained with sitting EOB. SpO2 84% on RA with bathroom mobility with HR up to 193bpm (asymptomatic). 150s at rest, cleared to work with by RN and reached out to MD for further parameter clarifications    Pertinent Vitals/ Pain       Pain Assessment Pain Assessment: No/denies pain  Home Living                                           Prior Functioning/Environment              Frequency  Min 3X/week        Progress Toward Goals  OT Goals(current goals can now be found in the care plan section)  Progress towards OT goals: Progressing toward goals  Acute Rehab OT Goals Patient Stated Goal: go home by Valentine's day OT Goal Formulation: With patient Time For Goal Achievement: 03/05/21 Potential to Achieve Goals: Good ADL Goals Pt Will Perform Upper Body Dressing: with modified independence Pt Will Perform Lower Body Dressing: with modified independence;sit to/from stand Pt Will Transfer to Toilet: with modified independence;ambulating;regular height toilet Pt Will Perform Toileting - Clothing Manipulation and hygiene: with modified independence;sit to/from stand Pt Will Perform Tub/Shower Transfer: Tub transfer;Shower transfer;3 in 1 Additional ADL Goal #1: Patient will score <4/28 on SBT indicating improved cognition in prep for completion of ADLs/IADLs.  Plan Discharge plan remains appropriate    Co-evaluation                 AM-PAC OT "6 Clicks" Daily Activity     Outcome Measure   Help from another person eating meals?: None Help from another person taking care of  personal grooming?: A Little Help from another person toileting, which includes using toliet, bedpan, or urinal?: A Little Help from another person bathing (including washing, rinsing, drying)?: A Lot Help from another person to put on and taking off regular upper body clothing?: A Little Help from another person to put on and taking off regular lower body clothing?: A Lot 6 Click Score: 17    End of Session Equipment Utilized During Treatment: Gait belt;Rolling walker (2 wheels)  OT Visit Diagnosis: Unsteadiness on feet (R26.81);Muscle weakness (generalized) (M62.81);Other symptoms and signs involving cognitive function   Activity Tolerance Patient tolerated treatment  well   Patient Left in chair;with call bell/phone within reach;with chair alarm set;Other (comment) (chair pad active but no nurse call cord in room - RN/NT aware)   Nurse Communication Mobility status        Time: [id number] OT Time Calculation (min): 40 min  Charges: OT General Charges $OT Visit: 1 Visit OT Treatments $Self Care/Home Management : 23-37 mins $Therapeutic Activity: 8-22 mins  Malachy Chamber, OTR/L Acute Rehab Services Office: [phone]   Layla Maw 02/21/2021, 11:31 AM

## 2021-02-21 NOTE — Progress Notes (Signed)
PROGRESS NOTE    [name]  GOT:[id number] DOB: 12-22-1970 DOA: 02/14/2021 PCP: Lemmie Evens, MD   Brief Narrative:  51 year old female found unresponsive the am of 2/3. CXR showed RLL airspace disease. Abx started. ABG obtained: 7.23/74/92/24 and subsequently intubated.  Under PCCM care through 02/19/2021 -found to have strep pneumonia as well as likely concurrent aspiration pneumonia.  2/3 admitted w/ acute resp failure 2/2 aspiration PNA and sepsis. IV Levaquin (anaphylaxis reported to PCN). Intubated in ER  2/4 PICC line placed, started on/off NE gtt for soft BP. + Strep pneumo urine antigen. Bronch thick mucus/food, unable to remove foreign body 2/5: Repeat Bronch, right lower lobe lateral segment with foreign body, could not be removed with suction.  2/6: Self extubated while on max Precedex infusion with fentanyl 2/7: Tolerating HFNC - transferred to [facility] service 2/8 -PICC line incidentally pulled overnight, otherwise continues to improve clinically 2/9 - Continues to improve with PT/OT - wean oxygen as tolerated, dispo planning ongoing 2/10 - afib continues to be labile - rate elevated overnight/with PT - add carvedilol  Assessment & Plan:   Acute Respiratory Failure with hypoxemia secondary to strep pneumo and aspiration pneumonia -Strep pneumonia sputum culture and strep pneumo urine antigen positive.   -Self-extubated on 2/6 and now on high flow nasal cannula - continue to wean oxygen as tolerated -Completed Levaquin - stop date 2/9   Transient A-fib with RVR, resolved  -Continue p.o. 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 - Low albumin suggest chronic problem. Hepatic steatosis on CT abdomen 2022. Cholecystectomy in 2013.History of elevated Alk phos in chart. Appears  had a US abdomen schedule by PCP for later this month for chronic elevation GGT elevated consistent with hepatobiliary etiology.  P: - hepatitis panel nonreactive   Thrombocytopenia Factor 5 Leiden ,heterozygous  - Platelet count improving daily - Likely secondary to chronic liver dysfunction as above   Hx of Major depressive disorder with anxiety  and Chronic pain syndrome.   - Cont Celexa Elavil 16m qhs VT Trintellix 118md - continue 0.5 clonazepam for xanax - on Vyvanse at home, equivalent adderall dose ongoing - Hold Lyrica for now (was on 200 mg tid)  - Hold Robaxin    Hx of GERD w/ h/o gastritis  -Continue PPI and sucralfate    HTN, essential Metabolic Alkalosis - Expect patient has OSA, BMI 43.  - Continue HCTZ/lisinopril - Needs outpatient sleep study per pulmonology   DVT prophylaxis: Lovenox Code Status: Full Family Communication: None present  Status is: Inpatient  Dispo: The patient is from: Home              Anticipated d/c is to: To be determined pending PT evaluation              Anticipated d/c date is: 24-48h              Patient currently not medically stable for discharge  Consultants:  PCCM  Procedures:  Intubation/extubation as above  Antimicrobials:  Levaquin through 02/20/2021  Subjective: No acute issues or events overnight respiratory status improving denies nausea vomiting diarrhea constipation headache fevers chills or chest pain  Objective: Vitals:   02/20/21 2047 02/21/21 0046 02/21/21 0500 02/21/21 0506  BP: (!) 136/102 138/84  (!) 155/85  Pulse: (!) 147 (!) 145  (!) 151  Resp: _0 Temp: 98.3 F (36.8 C) 98.3 F (36.8 C)  98.4 F (36.9 C)  TempSrc: Oral Oral  Oral  SpO2: 90% 91%  92%  Weight:   93.8 kg   Height:        Intake/Output Summary (Last 24 hours) at 02/21/2021 0715 Last data filed at 02/21/2021 0545 Gross per 24 hour  Intake 650 ml  Output 2300 ml  Net -1650 ml    Filed Weights   02/18/21 0400  02/20/21 1228 02/21/21 0500  Weight: 107.8 kg 96.4 kg 93.8 kg    Examination:  General:  Pleasantly resting in bed, No acute distress. HEENT:  Normocephalic atraumatic.  Sclerae nonicteric, noninjected.  Extraocular movements intact bilaterally. Neck:  Without mass or deformity.  Trachea is midline. Lungs:  Clear to auscultate bilaterally without rhonchi, wheeze, or rales. Heart:  Regular rate and rhythm.  Without murmurs, rubs, or gallops. Abdomen:  Soft, nontender, nondistended.  Without guarding or rebound. Extremities: Without cyanosis, clubbing, edema, or obvious deformity. Vascular:  Dorsalis pedis and posterior tibial pulses palpable bilaterally. Skin:  Warm and dry, no erythema, no ulcerations.  Data Reviewed: I have personally reviewed following labs and imaging studies  CBC: Recent Labs  Lab 02/14/21 0742 02/14/21 1421 02/16/21 0315 02/17/21 0416 02/18/21 0430 02/19/21 0200 02/20/21 0642  WBC 18.9*   < > 7.1 6.8 9.4 15.6* 14.3*  NEUTROABS 11.9*  --  5.5 4.7 5.6 11.5*  --   HGB 15.5*   < > 12.6 12.9 13.1 15.7* 15.2*  HCT 45.9   < > 36.4 39.1 39.5 44.6 43.8  MCV 102.5*   < > 102.2* 102.9* 99.7 95.1 94.0  PLT 159   < > 87* 84* 114* 167 226   < > = values in this interval not displayed.    Basic Metabolic Panel: Recent Labs  Lab 02/14/21 1421 02/14/21 1515 02/14/21 2153 02/15/21 0303 02/15/21 0443 02/15/21 1648 02/16/21 0315 02/17/21 0416 02/18/21 0430 02/18/21 2003 02/19/21 0200 02/20/21 0642 02/21/21 0416  NA 138   < >  --  135   < >  --    < > 138 140 137 135 138  --   K 5.3*   < >  --  4.7   < >  --    < > 4.8 4.1 3.2* 3.7 2.7*  --   CL 102  --   --  99  --   --    < > 98 96* 91* 92* 92*  --   CO2 25  --   --  28  --   --    < > 32 34* _1 --   GLUCOSE 112*  --   --  156*  --   --    < > 142* 114* 239* 138* 122*  --   BUN 58*  --   --  54*  --   --    < > _2 --   CREATININE 2.19*  --   --  1.87*  --   --    < > 0.83 0.77 0.74  0.71 0.87 0.97  CALCIUM 7.6*  --   --  7.6*  --   --    < > 8.1* 8.9 9.0 9.4 9.2  --   MG 1.6*  --  1.6* 1.7  --  1.9  --   --   --  1.6*  --  2.0  --   PHOS 6.4*  --  5.2* 3.8  --  3.1  --   --   --   --   --   --   --    < > = values in this interval not displayed.    GFR: Estimated Creatinine Clearance: 75.6 mL/min (by C-G formula based on SCr of 0.97 mg/dL). Liver Function Tests: Recent Labs  Lab 02/14/21 1421 02/16/21 0315 02/17/21 0416 02/18/21 0430 02/19/21 0200  AST 119* 134* 99* 86* 62*  ALT 65* 89* 84* 83* 75*  ALKPHOS 137* 146* 179* 192* 190*  BILITOT 1.1 0.9 0.9 0.9 0.9  PROT 5.8* 5.1* 5.4* 6.1* 7.2  ALBUMIN 2.4* 1.9* 1.9* 2.1* 2.5*    Recent Labs  Lab 02/14/21 0742  LIPASE 24    No results for input(s): AMMONIA in the last 168 hours. Coagulation Profile: No results for input(s): INR, PROTIME in the last 168 hours. Cardiac Enzymes: No results for input(s): CKTOTAL, CKMB, CKMBINDEX, TROPONINI in the last 168 hours. BNP (last 3 results) No results for input(s): PROBNP in the last 8760 hours. HbA1C: No results for input(s): HGBA1C in the last 72 hours. CBG: Recent Labs  Lab 02/20/21 1101 02/20/21 1626 02/20/21 2105 02/21/21 0044 02/21/21 0357  GLUCAP 132* 146* 186* 162* 150*    Lipid Profile: No results for input(s): CHOL, HDL, LDLCALC, TRIG, CHOLHDL, LDLDIRECT in the last 72 hours.  Thyroid Function Tests: No results for input(s): TSH, T4TOTAL, FREET4, T3FREE, THYROIDAB in the last 72 hours. Anemia Panel: No results for input(s): VITAMINB12, FOLATE, FERRITIN, TIBC, IRON, RETICCTPCT in the last 72 hours. Sepsis Labs: Recent Labs  Lab 02/14/21 0742 02/14/21 1009 02/14/21 1421 02/14/21 2153 02/15/21 0303 02/16/21 0315  PROCALCITON  --   --  28.89  --  17.32 7.41  LATICACIDVEN 2.7* 2.1* 1.5 1.8  --   --      Recent Results (from the past 240 hour(s))  Culture, blood (Routine X 2) w Reflex to ID Panel     Status: None   Collection Time:  02/14/21  8:11 AM   Specimen: Urine, Clean Catch; Blood  Result Value Ref Range Status   Specimen Description URINE, CLEAN CATCH  Final   Special Requests URINE, CLEAN CATCH  Final   Culture   Final    NO GROWTH 5 DAYS Performed at [facility], [address] [place], [place] [address]    Report Status 02/19/2021 FINAL  Final  Resp Panel by RT-PCR (Flu A&B, Covid) Nasopharyngeal Swab     Status: None   Collection Time: 02/14/21  9:00 AM   Specimen: Nasopharyngeal Swab; Nasopharyngeal(NP) swabs in vial transport medium  Result Value Ref Range Status   SARS Coronavirus 2 by RT PCR NEGATIVE NEGATIVE Final    Comment: (NOTE) SARS-CoV-2 target nucleic acids are NOT DETECTED.  The SARS-CoV-2 RNA is generally detectable in upper respiratory specimens during the acute phase of infection. The lowest concentration of SARS-CoV-2 viral copies this assay can detect is 138 copies/mL. A negative result does not preclude SARS-Cov-2 infection and should not be used as the sole basis for treatment or other patient management decisions. A negative result may occur with  improper specimen collection/handling, submission of specimen other than nasopharyngeal swab, presence of viral mutation(s) within the areas targeted by this assay, and inadequate number of viral copies(<138 copies/mL). A negative result must be combined with clinical observations, patient history, and epidemiological information. The expected result is Negative.  Fact  Sheet for Patients:  EntrepreneurPulse.com.au  Fact Sheet for Healthcare Providers:  IncredibleEmployment.be  This test is no t yet approved or cleared by the [place] FDA and  has been authorized for detection and/or diagnosis of SARS-CoV-2 by FDA under an Emergency Use Authorization (EUA). This EUA will remain  in effect (meaning this test can be used) for the duration of the COVID-19 declaration under Section 564(b)(1)  of the Act, 21 U.S.C.section 360bbb-3(b)(1), unless the authorization is terminated  or revoked sooner.       Influenza A by PCR NEGATIVE NEGATIVE Final   Influenza B by PCR NEGATIVE NEGATIVE Final    Comment: (NOTE) The Xpert Xpress SARS-CoV-2/FLU/RSV plus assay is intended as an aid in the diagnosis of influenza from Nasopharyngeal swab specimens and should not be used as a sole basis for treatment. Nasal washings and aspirates are unacceptable for Xpert Xpress SARS-CoV-2/FLU/RSV testing.  Fact Sheet for Patients: EntrepreneurPulse.com.au  Fact Sheet for Healthcare Providers: IncredibleEmployment.be  This test is not yet approved or cleared by the [place] FDA and has been authorized for detection and/or diagnosis of SARS-CoV-2 by FDA under an Emergency Use Authorization (EUA). This EUA will remain in effect (meaning this test can be used) for the duration of the COVID-19 declaration under Section 564(b)(1) of the Act, 21 U.S.C. section 360bbb-3(b)(1), unless the authorization is terminated or revoked.  Performed at [facility], [address] [place], [place] [address]   Culture, Respiratory w Gram Stain     Status: None   Collection Time: 02/14/21  9:15 AM   Specimen: SPU  Result Value Ref Range Status   Specimen Description   Final    SPUTUM Performed at [facility], [address], [place], [place] [address]    Special Requests   Final    NONE Performed at [facility], [address] [place], [place] [address]    Gram Stain   Final    MODERATE WBC PRESENT,BOTH PMN AND MONONUCLEAR MODERATE GRAM POSITIVE COCCI MODERATE GRAM NEGATIVE RODS Performed at [facility], [place] [address], [place], [place] [address]    Culture   Final    ABUNDANT STREPTOCOCCUS PNEUMONIAE FEW ESCHERICHIA COLI    Report Status 02/16/2021 FINAL  Final   Organism ID, Bacteria STREPTOCOCCUS PNEUMONIAE  Final   Organism ID, Bacteria  ESCHERICHIA COLI  Final      Susceptibility   Escherichia coli - MIC*    AMPICILLIN >=32 RESISTANT Resistant     CEFAZOLIN <=4 SENSITIVE Sensitive     CEFEPIME <=0.12 SENSITIVE Sensitive     CEFTAZIDIME <=1 SENSITIVE Sensitive     CEFTRIAXONE <=0.25 SENSITIVE Sensitive     CIPROFLOXACIN <=0.25 SENSITIVE Sensitive     GENTAMICIN >=16 RESISTANT Resistant     IMIPENEM <=0.25 SENSITIVE Sensitive     TRIMETH/SULFA >=320 RESISTANT Resistant     AMPICILLIN/SULBACTAM 16 INTERMEDIATE Intermediate     PIP/TAZO <=4 SENSITIVE Sensitive     * FEW ESCHERICHIA COLI   Streptococcus pneumoniae - MIC*    ERYTHROMYCIN >=8 RESISTANT Resistant     LEVOFLOXACIN 1 SENSITIVE Sensitive     VANCOMYCIN 0.5 SENSITIVE Sensitive     PENO - penicillin 0.12      PENICILLIN (non-meningitis) 0.12 SENSITIVE Sensitive     PENICILLIN (oral) 0.12 INTERMEDIATE Intermediate     CEFTRIAXONE (non-meningitis) 0.25 SENSITIVE Sensitive     * ABUNDANT STREPTOCOCCUS PNEUMONIAE  Culture, blood (Routine X 2) w Reflex to ID Panel  Status: None   Collection Time: 02/14/21 10:06 AM   Specimen: BLOOD LEFT HAND  Result Value Ref Range Status   Specimen Description   Final    BLOOD LEFT HAND BOTTLES DRAWN AEROBIC AND ANAEROBIC   Special Requests Blood Culture adequate volume  Final   Culture   Final    NO GROWTH 5 DAYS Performed at [facility], [address], [place], [place] [address]    Report Status 02/19/2021 FINAL  Final  Culture, Respiratory w Gram Stain (tracheal aspirate)     Status: None   Collection Time: 02/14/21  2:04 PM   Specimen: Tracheal Aspirate; Respiratory  Result Value Ref Range Status   Specimen Description TRACHEAL ASPIRATE  Final   Special Requests Normal  Final   Gram Stain   Final    ABUNDANT WBC PRESENT,BOTH PMN AND MONONUCLEAR ABUNDANT GRAM POSITIVE COCCI RARE GRAM NEGATIVE RODS    Culture   Final    ABUNDANT STREPTOCOCCUS PNEUMONIAE FEW ESCHERICHIA COLI FEW STREPTOCOCCUS  AGALACTIAE TESTING AGAINST S. AGALACTIAE NOT ROUTINELY PERFORMED DUE TO PREDICTABILITY OF AMP/PEN/VAN SUSCEPTIBILITY. Performed at [facility], [place] [address] [place], [place] [address]    Report Status 02/18/2021 FINAL  Final   Organism ID, Bacteria STREPTOCOCCUS PNEUMONIAE  Final   Organism ID, Bacteria ESCHERICHIA COLI  Final      Susceptibility   Escherichia coli - MIC*    AMPICILLIN >=32 RESISTANT Resistant     CEFAZOLIN <=4 SENSITIVE Sensitive     CEFEPIME <=0.12 SENSITIVE Sensitive     CEFTAZIDIME <=1 SENSITIVE Sensitive     CEFTRIAXONE <=0.25 SENSITIVE Sensitive     CIPROFLOXACIN <=0.25 SENSITIVE Sensitive     GENTAMICIN >=16 RESISTANT Resistant     IMIPENEM <=0.25 SENSITIVE Sensitive     TRIMETH/SULFA >=320 RESISTANT Resistant     AMPICILLIN/SULBACTAM >=32 RESISTANT Resistant     PIP/TAZO <=4 SENSITIVE Sensitive     * FEW ESCHERICHIA COLI   Streptococcus pneumoniae - MIC*    ERYTHROMYCIN >=8 RESISTANT Resistant     LEVOFLOXACIN 1 SENSITIVE Sensitive     VANCOMYCIN 0.5 SENSITIVE Sensitive     PENICILLIN (meningitis) 0.12 RESISTANT Resistant     PENO - penicillin 0.12      PENICILLIN (non-meningitis) 0.12 SENSITIVE Sensitive     PENICILLIN (oral) 0.12 INTERMEDIATE Intermediate     CEFTRIAXONE (non-meningitis) 0.25 SENSITIVE Sensitive     CEFTRIAXONE (meningitis) 0.25 SENSITIVE Sensitive     * ABUNDANT STREPTOCOCCUS PNEUMONIAE  MRSA Next Gen by PCR, Nasal     Status: None   Collection Time: 02/14/21  2:04 PM   Specimen: Nasal Mucosa; Nasal Swab  Result Value Ref Range Status   MRSA by PCR Next Gen NOT DETECTED NOT DETECTED Final    Comment: (NOTE) The GeneXpert MRSA Assay (FDA approved for NASAL specimens only), is one component of a comprehensive MRSA colonization surveillance program. It is not intended to diagnose MRSA infection nor to guide or monitor treatment for MRSA infections. Test performance is not FDA approved in patients less than 44  years old. Performed at [facility], [place] [address] [place], [place] [address]   Culture, Respiratory w Gram Stain     Status: None   Collection Time: 02/15/21 11:15 AM   Specimen: Bronchoalveolar Lavage; Respiratory  Result Value Ref Range Status   Specimen Description BRONCHIAL ALVEOLAR LAVAGE  Final   Special Requests NONE  Final   Gram Stain   Final    ABUNDANT  WBC PRESENT, PREDOMINANTLY PMN ABUNDANT GRAM POSITIVE COCCI    Culture   Final    FEW STREPTOCOCCUS PNEUMONIAE RARE ESCHERICHIA COLI SUSCEPTIBILITIES PERFORMED ON PREVIOUS CULTURE WITHIN THE LAST 5 DAYS. FEW STREPTOCOCCUS AGALACTIAE TESTING AGAINST S. AGALACTIAE NOT ROUTINELY PERFORMED DUE TO PREDICTABILITY OF AMP/PEN/VAN SUSCEPTIBILITY. Performed at [facility], [place] [address] [place], [place] [address]    Report Status 02/18/2021 FINAL  Final          Radiology Studies: No results found.      Scheduled Meds:  amiodarone  200 mg Oral Daily   amitriptyline  75 mg Oral QHS   amphetamine-dextroamphetamine  20 mg Oral Q breakfast   Chlorhexidine Gluconate Cloth  6 each Topical Daily   citalopram  20 mg Oral Daily   clonazepam  0.5 mg Oral BID   docusate  100 mg Oral BID   enoxaparin (LOVENOX) injection  40 mg Subcutaneous Q24H   guaiFENesin  10 mL Oral Q6H   hydrochlorothiazide  12.5 mg Oral Daily   insulin aspart  0-15 Units Subcutaneous Q4H   lisinopril  20 mg Oral Daily   mouth rinse  15 mL Mouth Rinse BID   nicotine  21 mg Transdermal Daily   pantoprazole  40 mg Oral Daily   polyethylene glycol  17 g Oral Daily   senna-docusate  1 tablet Oral BID   sodium chloride flush  3 mL Intracatheter Q12H   sucralfate  1 g Oral BID   vortioxetine HBr  10 mg Oral Daily   Continuous Infusions:  sodium chloride Stopped (02/19/21 2333)    LOS: 7 days   Time spent: 59mn  Jihad Brownlow C Jaquavion Mccannon, DO [facility]  If 7PM-7AM, please contact night-coverage [email or web address]  02/21/2021,  7:15 AM

## 2021-02-21 NOTE — Progress Notes (Signed)
°   02/20/21 2047  Assess: MEWS Score  Temp 98.3 F (36.8 C)  BP (!) 136/102  Pulse Rate (!) 147  Resp 20  Level of Consciousness Alert  SpO2 90 %  O2 Flow Rate (L/min) 3 L/min  Assess: MEWS Score  MEWS Temp 0  MEWS Systolic 0  MEWS Pulse 3  MEWS RR 0  MEWS LOC 0  MEWS Score 3  MEWS Score Color Yellow  Assess: if the MEWS score is Yellow or Red  Were vital signs taken at a resting state? Yes  Focused Assessment Change from prior assessment (see assessment flowsheet)  Early Detection of Sepsis Score *See Row Information* Low  MEWS guidelines implemented *See Row Information* Yes  Treat  Pain Scale 0-10  Pain Score 0  Take Vital Signs  Increase Vital Sign Frequency  Yellow: Q 2hr X 2 then Q 4hr X 2, if remains yellow, continue Q 4hrs  Escalate  MEWS: Escalate Yellow: discuss with charge nurse/RN and consider discussing with provider and RRT  Notify: Charge Nurse/RN  Name of Charge Nurse/RN Notified Charito, RN  Date Charge Nurse/RN Notified 02/20/21  Time Charge Nurse/RN Notified 2050  Notify: Provider  Provider Name/Title Opyd, MD  Date Provider Notified 02/20/21  Time Provider Notified 2050  Notification Type Page  Notification Reason Change in status  Provider response See new orders (Labetolol 5mg  iv x1)  Date of Provider Response 02/20/21  Time of Provider Response 2100  Document  Patient Outcome Not stable and remains on department  Progress note created (see row info) Yes

## 2021-02-21 NOTE — Progress Notes (Signed)
Physical Therapy Treatment Patient Details Name: [name] MRN: [id number] DOB: [birth year] Today's Date: 02/21/2021   History of Present Illness Pt adm 2/3 with acute resp failure secondary to aspiration PNA and sepsis. Intubated in ED. Bronch 2/5 with foreign body. Self extubated 2/6. PMH - Leiden Factor V, HTN, chronic laryngitis, anxiety, chronic pain.    PT Comments    Patient progressing towards physical therapy goals. Patient ambulating on RA with spO2 89-90% throughout and HR max 187. Patient at min guard level for mobility with RW. Patient demos poor safety awareness and poor insight into deficits. Provided cues throughout for safety but patient reluctant to follow safety cues. Recommend OPPT at discharge to address balance and strength deficits.    Recommendations for follow up therapy are one component of a multi-disciplinary discharge planning process, led by the attending physician.  Recommendations may be updated based on patient status, additional functional criteria and insurance authorization.  Follow Up Recommendations  Outpatient PT     Assistance Recommended at Discharge Frequent or constant Supervision/Assistance  Patient can return home with the following A little help with walking and/or transfers;Help with stairs or ramp for entrance   Equipment Recommendations  Rollator (4 wheels)    Recommendations for Other Services       Precautions / Restrictions Precautions Precautions: Fall;Other (comment) Precaution Comments: monitor O2 (does not wear at baseline; goal 88% and above), HR (very tachy; not HR parameters per MD) Restrictions Weight Bearing Restrictions: No     Mobility  Bed Mobility Overal bed mobility: Needs Assistance Bed Mobility: Supine to Sit, Sit to Supine     Supine to sit: Supervision Sit to supine: Supervision        Transfers Overall transfer level: Needs assistance Equipment used: Rolling [name] (2 wheels) Transfers: Sit  to/from Stand Sit to Stand: Min guard           General transfer comment: cues for hand placement but patient ultimately pulls up on RW despite cues    Ambulation/Gait Ambulation/Gait assistance: Min guard Gait Distance (Feet): 100 Feet Assistive device: Rolling [name] (2 wheels) Gait Pattern/deviations: Step-through pattern, Decreased stride length Gait velocity: decreased     General Gait Details: min guard for safety. 3/4 DOE. SpO2 89-90% on RA during ambulation   Stairs             Wheelchair Mobility    Modified Rankin (Stroke Patients Only)       Balance Overall balance assessment: Needs assistance Sitting-balance support: No upper extremity supported, Feet supported Sitting balance-[name] Scale: Fair     Standing balance support: Bilateral upper extremity supported, During functional activity Standing balance-[name] Scale: Poor                              Cognition Arousal/Alertness: Awake/alert Behavior During Therapy: Impulsive Overall Cognitive Status: Impaired/Different from baseline Area of Impairment: Safety/judgement, Awareness, Problem solving                         Safety/Judgement: Decreased awareness of deficits, Decreased awareness of safety Awareness: Emergent Problem Solving: Slow processing, Requires verbal cues, Requires tactile cues General Comments: difficult to follow conversation and patient demos poor insight into deficits and poor safety awareness        Exercises      General Comments General comments (skin integrity, edema, etc.): HR max 187 and asymptomatic. Ambulated on RA with spO2  89-90%      Pertinent Vitals/Pain Pain Assessment Pain Assessment: No/denies pain    Home Living                          Prior Function            PT Goals (current goals can now be found in the care plan section) Acute Rehab PT Goals Patient Stated Goal: return home PT Goal Formulation: With  patient Time For Goal Achievement: 03/04/21 Potential to Achieve Goals: Good Progress towards PT goals: Progressing toward goals    Frequency    Min 3X/week      PT Plan Discharge plan needs to be updated    Co-evaluation              AM-PAC PT "6 Clicks" Mobility   Outcome Measure  Help needed turning from your back to your side while in a flat bed without using bedrails?: A Little Help needed moving from lying on your back to sitting on the side of a flat bed without using bedrails?: A Little Help needed moving to and from a bed to a chair (including a wheelchair)?: A Little Help needed standing up from a chair using your arms (e.g., wheelchair or bedside chair)?: A Little Help needed to walk in hospital room?: A Little Help needed climbing 3-5 steps with a railing? : A Little 6 Click Score: 18    End of Session Equipment Utilized During Treatment: Gait belt Activity Tolerance: Patient tolerated treatment well Patient left: in bed;with call bell/phone within reach;with bed alarm set Nurse Communication: Mobility status PT Visit Diagnosis: Unsteadiness on feet (R26.81);Other abnormalities of gait and mobility (R26.89);Muscle weakness (generalized) (M62.81)     Time: [id number] PT Time Calculation (min) (ACUTE ONLY): 23 min  Charges:  $Gait Training: 23-37 mins                     [name] A. [name] PT, DPT Acute Rehabilitation Services Pager [phone] Office [phone]    [name] 02/21/2021, 2:46 PM

## 2021-02-21 NOTE — Significant Event (Signed)
Rapid Response Event Note   Reason for Call : Tachycardia   Initial Focused Assessment: Called to bedside for persistent tachycardia. On arrival, patient is alert and oriented x4 and asking to go home. Pt skin is warm and dry. She has scattered rhonchi bilaterally. She is on room air. Abdomen is soft and non tender. Palpable pulses bilateral extremities. Pt denies chest pain or shortness of breath. 12 lead EKG done which showed ST with PACs.  VS: BP 140/89, HR 150, RR 20, O2 93% on room air, T 98.2 orally VS after IV Metoprolol at 1521: BP 97/73, HR 141  Interventions:  -12 Lead EKG -2.5mg  PRN Metoprolol given IV by bedside RN  Plan of Care:  -Pt wishes to leave AMA. MD is aware and has already had similar conversation with patient this morning regarding leaving AMA. Pt understands the risks while also saying "if I die, you all are not responsible for me." Bedside RN to assist pt with AMA procedure.   Event Summary:   MD Notified: Dr. Natale Milch Call Time: 1455 Arrival Time: 1458 End Time: 1530  Lowella Petties, RN

## 2021-02-21 NOTE — TOC Initial Note (Addendum)
Transition of Care [facility]) - Initial/Assessment Note    Patient Details  Name: [name] MRN: [id number] Date of Birth: August 27, 1970  Transition of Care [facility]) CM/SW Contact:    Tom-Johnson, Renea Ee, RN Phone Number: 02/21/2021, 3:37 PM  Clinical Narrative:                  CM spoke with patient at bedside about needs for post hospital transition. Admitted for Respiratory Failure with Hypoxia. From home with spouse, son and son's girlfriend. Currently on disability and uses [employer] for transportation. PT/OT had recommended home health but due to patient's insurance, CM unable to make referrals. CM spoke with patient and her spouse, Jenny Reichmann and both agreed that patient can go to outpatient PT in [place]. John states patient has support at home, states he will be able to assist with her therapy at home. CM notified PT/OT and outpatient recommended. Order placed and information on AVS. CM ordered bedside commode and rollator from [employer] and Freda Munro to deliver at bedside. PCP is Lemmie Evens, MD and uses [facility]. CM will continue to follow with needs.    Expected Discharge Plan: OP Rehab Barriers to Discharge: Continued Medical Work up   Patient Goals and CMS Choice Patient states their goals for this hospitalization and ongoing recovery are:: To return home CMS Medicare.gov Compare Post Acute Care list provided to:: Patient Choice offered to / list presented to : Patient  Expected Discharge Plan and Services Expected Discharge Plan: OP Rehab   Discharge Planning Services: CM Consult Post Acute Care Choice: NA Living arrangements for the past 2 months: Mobile Home                 DME Arranged: 3-N-1, Walker rolling with seat DME Agency: [employer] Date DME Agency Contacted: 02/21/21 Time DME Agency Contacted: [id number] Representative spoke with at DME Agency: Freda Munro            Prior Living Arrangements/Services Living arrangements for the past 2 months:  Mobile Home Lives with:: Significant Other, Adult Children (Son and his girlfriend) Patient language and need for interpreter reviewed:: Yes Do you feel safe going back to the place where you live?: Yes      Need for Family Participation in Patient Care: Yes (Comment) Care giver support system in place?: Yes (comment)   Criminal Activity/Legal Involvement Pertinent to Current Situation/Hospitalization: No - Comment as needed  Activities of Daily Living      Permission Sought/Granted Permission sought to share information with : Case Manager, Customer service manager, Family Supports Permission granted to share information with : Yes, Verbal Permission Granted              Emotional Assessment Appearance:: Appears stated age Attitude/Demeanor/Rapport: Engaged, Gracious Affect (typically observed): Accepting, Appropriate, Calm, Hopeful Orientation: : Oriented to Self, Oriented to Place, Oriented to  Time, Oriented to Situation Alcohol / Substance Use: Not Applicable Psych Involvement: No (comment)  Admission diagnosis:  Respiratory failure ([place]) [J96.90] Encounter for orogastric (OG) tube placement [Z46.59] Acute respiratory failure with hypercapnia (HCC) [J96.02] AKI (acute kidney injury) ([place]) [N17.9] Aspiration pneumonia of right middle lobe due to vomit ([place]) [J69.0] Sepsis, due to unspecified organism, unspecified whether acute organ dysfunction present ([place]) [A41.9] Respiratory failure with hypoxia and hypercapnia ([place]) [J96.91, J96.92] Patient Active Problem List   Diagnosis Date Noted   Acute respiratory failure with hypercapnia ([place])    Factor V Leiden mutation ([place]) 02/14/2021   Respiratory failure with hypoxia and hypercapnia (  [place]) 02/14/2021   Aspiration pneumonia of right lower lobe due to vomit ([place]) 02/14/2021   Sepsis with acute organ dysfunction ([place]) 99991111   Toxic metabolic encephalopathy 99991111   AKI (acute kidney injury) ([place]) 02/14/2021    Alteration in electrolyte and fluid balance 02/14/2021   Lactic acidosis 02/14/2021   Elevated LFTs 02/14/2021   H/O gastroesophageal reflux (GERD) 02/14/2021   Hyperlipidemia 02/14/2021   Intractable vomiting with nausea 08/31/2016   Generalized weakness 08/31/2016   Chronic pain syndrome 08/31/2016   Hypokalemia 12/11/2015   Hyponatremia 12/11/2015   History of embolectomy of left arm.  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

## 2021-02-21 NOTE — Progress Notes (Signed)
Pt. Leaving against medical advice. MD notified,charge nurse notified. Pt. Sign formed.

## 2021-02-24 DIAGNOSIS — J9602 Acute respiratory failure with hypercapnia: Secondary | ICD-10-CM | POA: Diagnosis not present

## 2021-02-24 DIAGNOSIS — R6520 Severe sepsis without septic shock: Secondary | ICD-10-CM | POA: Diagnosis not present

## 2021-04-04 DIAGNOSIS — Z79899 Other long term (current) drug therapy: Secondary | ICD-10-CM | POA: Diagnosis not present

## 2021-04-04 DIAGNOSIS — F341 Dysthymic disorder: Secondary | ICD-10-CM | POA: Diagnosis not present

## 2021-04-04 DIAGNOSIS — F900 Attention-deficit hyperactivity disorder, predominantly inattentive type: Secondary | ICD-10-CM | POA: Diagnosis not present

## 2021-04-04 DIAGNOSIS — Z6841 Body Mass Index (BMI) 40.0 and over, adult: Secondary | ICD-10-CM | POA: Diagnosis not present

## 2021-04-04 DIAGNOSIS — G4700 Insomnia, unspecified: Secondary | ICD-10-CM | POA: Diagnosis not present

## 2021-04-04 DIAGNOSIS — F411 Generalized anxiety disorder: Secondary | ICD-10-CM | POA: Diagnosis not present

## 2021-04-08 DIAGNOSIS — Z79899 Other long term (current) drug therapy: Secondary | ICD-10-CM | POA: Diagnosis not present

## 2021-05-12 DIAGNOSIS — A419 Sepsis, unspecified organism: Secondary | ICD-10-CM

## 2021-05-12 HISTORY — DX: Sepsis, unspecified organism: A41.9

## 2021-05-16 ENCOUNTER — Emergency Department (HOSPITAL_COMMUNITY): Payer: Medicaid Other

## 2021-05-16 ENCOUNTER — Inpatient Hospital Stay (HOSPITAL_COMMUNITY)
Admission: EM | Admit: 2021-05-16 | Discharge: 2021-06-03 | DRG: 870 | Disposition: A | Discharge status: Home Health | Payer: Medicaid Other | Attending: Internal Medicine | Admitting: Internal Medicine

## 2021-05-16 DIAGNOSIS — A419 Sepsis, unspecified organism: Secondary | ICD-10-CM | POA: Diagnosis not present

## 2021-05-16 DIAGNOSIS — I110 Hypertensive heart disease with heart failure: Secondary | ICD-10-CM | POA: Diagnosis not present

## 2021-05-16 DIAGNOSIS — I483 Typical atrial flutter: Secondary | ICD-10-CM | POA: Diagnosis not present

## 2021-05-16 DIAGNOSIS — R570 Cardiogenic shock: Secondary | ICD-10-CM | POA: Diagnosis present

## 2021-05-16 DIAGNOSIS — K219 Gastro-esophageal reflux disease without esophagitis: Secondary | ICD-10-CM | POA: Diagnosis present

## 2021-05-16 DIAGNOSIS — D6851 Activated protein C resistance: Secondary | ICD-10-CM | POA: Diagnosis present

## 2021-05-16 DIAGNOSIS — Z6841 Body Mass Index (BMI) 40.0 and over, adult: Secondary | ICD-10-CM

## 2021-05-16 DIAGNOSIS — I5020 Unspecified systolic (congestive) heart failure: Secondary | ICD-10-CM | POA: Diagnosis not present

## 2021-05-16 DIAGNOSIS — I5082 Biventricular heart failure: Secondary | ICD-10-CM | POA: Diagnosis present

## 2021-05-16 DIAGNOSIS — Z7982 Long term (current) use of aspirin: Secondary | ICD-10-CM

## 2021-05-16 DIAGNOSIS — J9692 Respiratory failure, unspecified with hypercapnia: Secondary | ICD-10-CM | POA: Diagnosis present

## 2021-05-16 DIAGNOSIS — E875 Hyperkalemia: Secondary | ICD-10-CM | POA: Diagnosis not present

## 2021-05-16 DIAGNOSIS — R404 Transient alteration of awareness: Secondary | ICD-10-CM | POA: Diagnosis not present

## 2021-05-16 DIAGNOSIS — J80 Acute respiratory distress syndrome: Secondary | ICD-10-CM | POA: Diagnosis present

## 2021-05-16 DIAGNOSIS — J154 Pneumonia due to other streptococci: Secondary | ICD-10-CM | POA: Diagnosis not present

## 2021-05-16 DIAGNOSIS — R4020 Unspecified coma: Secondary | ICD-10-CM | POA: Diagnosis not present

## 2021-05-16 DIAGNOSIS — Z791 Long term (current) use of non-steroidal anti-inflammatories (NSAID): Secondary | ICD-10-CM

## 2021-05-16 DIAGNOSIS — R6521 Severe sepsis with septic shock: Secondary | ICD-10-CM | POA: Diagnosis not present

## 2021-05-16 DIAGNOSIS — Z880 Allergy status to penicillin: Secondary | ICD-10-CM

## 2021-05-16 DIAGNOSIS — S199XXA Unspecified injury of neck, initial encounter: Secondary | ICD-10-CM | POA: Diagnosis not present

## 2021-05-16 DIAGNOSIS — I4892 Unspecified atrial flutter: Secondary | ICD-10-CM | POA: Diagnosis present

## 2021-05-16 DIAGNOSIS — Z886 Allergy status to analgesic agent status: Secondary | ICD-10-CM

## 2021-05-16 DIAGNOSIS — F32A Depression, unspecified: Secondary | ICD-10-CM | POA: Diagnosis present

## 2021-05-16 DIAGNOSIS — I4819 Other persistent atrial fibrillation: Secondary | ICD-10-CM | POA: Diagnosis not present

## 2021-05-16 DIAGNOSIS — J9691 Respiratory failure, unspecified with hypoxia: Secondary | ICD-10-CM | POA: Diagnosis present

## 2021-05-16 DIAGNOSIS — J15211 Pneumonia due to Methicillin susceptible Staphylococcus aureus: Secondary | ICD-10-CM | POA: Diagnosis present

## 2021-05-16 DIAGNOSIS — E873 Alkalosis: Secondary | ICD-10-CM | POA: Diagnosis not present

## 2021-05-16 DIAGNOSIS — Z66 Do not resuscitate: Secondary | ICD-10-CM | POA: Diagnosis not present

## 2021-05-16 DIAGNOSIS — Z79899 Other long term (current) drug therapy: Secondary | ICD-10-CM

## 2021-05-16 DIAGNOSIS — I739 Peripheral vascular disease, unspecified: Secondary | ICD-10-CM | POA: Diagnosis not present

## 2021-05-16 DIAGNOSIS — G928 Other toxic encephalopathy: Secondary | ICD-10-CM | POA: Diagnosis present

## 2021-05-16 DIAGNOSIS — T463X5A Adverse effect of coronary vasodilators, initial encounter: Secondary | ICD-10-CM | POA: Diagnosis not present

## 2021-05-16 DIAGNOSIS — Z1152 Encounter for screening for COVID-19: Secondary | ICD-10-CM | POA: Diagnosis not present

## 2021-05-16 DIAGNOSIS — Z825 Family history of asthma and other chronic lower respiratory diseases: Secondary | ICD-10-CM

## 2021-05-16 DIAGNOSIS — K7200 Acute and subacute hepatic failure without coma: Secondary | ICD-10-CM | POA: Diagnosis present

## 2021-05-16 DIAGNOSIS — I2781 Cor pulmonale (chronic): Secondary | ICD-10-CM | POA: Diagnosis not present

## 2021-05-16 DIAGNOSIS — R0609 Other forms of dyspnea: Secondary | ICD-10-CM | POA: Diagnosis not present

## 2021-05-16 DIAGNOSIS — R0902 Hypoxemia: Secondary | ICD-10-CM | POA: Diagnosis not present

## 2021-05-16 DIAGNOSIS — E7220 Disorder of urea cycle metabolism, unspecified: Secondary | ICD-10-CM

## 2021-05-16 DIAGNOSIS — J9601 Acute respiratory failure with hypoxia: Secondary | ICD-10-CM | POA: Diagnosis not present

## 2021-05-16 DIAGNOSIS — E876 Hypokalemia: Secondary | ICD-10-CM | POA: Diagnosis not present

## 2021-05-16 DIAGNOSIS — J3489 Other specified disorders of nose and nasal sinuses: Secondary | ICD-10-CM | POA: Diagnosis not present

## 2021-05-16 DIAGNOSIS — I429 Cardiomyopathy, unspecified: Secondary | ICD-10-CM | POA: Diagnosis not present

## 2021-05-16 DIAGNOSIS — E785 Hyperlipidemia, unspecified: Secondary | ICD-10-CM | POA: Diagnosis present

## 2021-05-16 DIAGNOSIS — I340 Nonrheumatic mitral (valve) insufficiency: Secondary | ICD-10-CM | POA: Diagnosis not present

## 2021-05-16 DIAGNOSIS — R0603 Acute respiratory distress: Secondary | ICD-10-CM | POA: Diagnosis not present

## 2021-05-16 DIAGNOSIS — R000 Tachycardia, unspecified: Secondary | ICD-10-CM | POA: Diagnosis not present

## 2021-05-16 DIAGNOSIS — S0990XA Unspecified injury of head, initial encounter: Secondary | ICD-10-CM | POA: Diagnosis not present

## 2021-05-16 DIAGNOSIS — E8729 Other acidosis: Secondary | ICD-10-CM | POA: Diagnosis not present

## 2021-05-16 DIAGNOSIS — I4891 Unspecified atrial fibrillation: Secondary | ICD-10-CM | POA: Diagnosis not present

## 2021-05-16 DIAGNOSIS — I428 Other cardiomyopathies: Secondary | ICD-10-CM | POA: Diagnosis not present

## 2021-05-16 DIAGNOSIS — F1310 Sedative, hypnotic or anxiolytic abuse, uncomplicated: Secondary | ICD-10-CM | POA: Diagnosis present

## 2021-05-16 DIAGNOSIS — A403 Sepsis due to Streptococcus pneumoniae: Principal | ICD-10-CM | POA: Diagnosis present

## 2021-05-16 DIAGNOSIS — I959 Hypotension, unspecified: Secondary | ICD-10-CM | POA: Diagnosis not present

## 2021-05-16 DIAGNOSIS — I5043 Acute on chronic combined systolic (congestive) and diastolic (congestive) heart failure: Secondary | ICD-10-CM | POA: Diagnosis not present

## 2021-05-16 DIAGNOSIS — Z981 Arthrodesis status: Secondary | ICD-10-CM | POA: Diagnosis not present

## 2021-05-16 DIAGNOSIS — F17210 Nicotine dependence, cigarettes, uncomplicated: Secondary | ICD-10-CM | POA: Diagnosis present

## 2021-05-16 DIAGNOSIS — J690 Pneumonitis due to inhalation of food and vomit: Secondary | ICD-10-CM | POA: Diagnosis present

## 2021-05-16 DIAGNOSIS — F1510 Other stimulant abuse, uncomplicated: Secondary | ICD-10-CM | POA: Diagnosis present

## 2021-05-16 DIAGNOSIS — I083 Combined rheumatic disorders of mitral, aortic and tricuspid valves: Secondary | ICD-10-CM | POA: Diagnosis not present

## 2021-05-16 DIAGNOSIS — G9340 Encephalopathy, unspecified: Secondary | ICD-10-CM | POA: Diagnosis not present

## 2021-05-16 DIAGNOSIS — G894 Chronic pain syndrome: Secondary | ICD-10-CM | POA: Diagnosis present

## 2021-05-16 DIAGNOSIS — I10 Essential (primary) hypertension: Secondary | ICD-10-CM | POA: Diagnosis not present

## 2021-05-16 DIAGNOSIS — Z8261 Family history of arthritis: Secondary | ICD-10-CM

## 2021-05-16 DIAGNOSIS — I2609 Other pulmonary embolism with acute cor pulmonale: Secondary | ICD-10-CM | POA: Diagnosis not present

## 2021-05-16 DIAGNOSIS — F410 Panic disorder [episodic paroxysmal anxiety] without agoraphobia: Secondary | ICD-10-CM | POA: Diagnosis present

## 2021-05-16 DIAGNOSIS — I5181 Takotsubo syndrome: Secondary | ICD-10-CM | POA: Diagnosis present

## 2021-05-16 DIAGNOSIS — N179 Acute kidney failure, unspecified: Secondary | ICD-10-CM | POA: Diagnosis present

## 2021-05-16 DIAGNOSIS — I517 Cardiomegaly: Secondary | ICD-10-CM | POA: Diagnosis not present

## 2021-05-16 DIAGNOSIS — R609 Edema, unspecified: Secondary | ICD-10-CM | POA: Diagnosis not present

## 2021-05-16 DIAGNOSIS — J9602 Acute respiratory failure with hypercapnia: Principal | ICD-10-CM | POA: Diagnosis present

## 2021-05-16 DIAGNOSIS — M549 Dorsalgia, unspecified: Secondary | ICD-10-CM | POA: Diagnosis present

## 2021-05-16 DIAGNOSIS — Z4682 Encounter for fitting and adjustment of non-vascular catheter: Secondary | ICD-10-CM | POA: Diagnosis not present

## 2021-05-16 DIAGNOSIS — Z833 Family history of diabetes mellitus: Secondary | ICD-10-CM

## 2021-05-16 DIAGNOSIS — F05 Delirium due to known physiological condition: Secondary | ICD-10-CM | POA: Diagnosis not present

## 2021-05-16 DIAGNOSIS — I5021 Acute systolic (congestive) heart failure: Secondary | ICD-10-CM | POA: Diagnosis not present

## 2021-05-16 LAB — RAPID URINE DRUG SCREEN, HOSP PERFORMED
Amphetamines: POSITIVE — AB
Barbiturates: NOT DETECTED
Benzodiazepines: POSITIVE — AB
Cocaine: NOT DETECTED
Opiates: NOT DETECTED
Tetrahydrocannabinol: NOT DETECTED

## 2021-05-16 LAB — URINALYSIS, ROUTINE W REFLEX MICROSCOPIC
Bilirubin Urine: NEGATIVE
Glucose, UA: NEGATIVE mg/dL
Hgb urine dipstick: NEGATIVE
Ketones, ur: NEGATIVE mg/dL
Leukocytes,Ua: NEGATIVE
Nitrite: NEGATIVE
Protein, ur: 100 mg/dL — AB
Specific Gravity, Urine: 1.024 (ref 1.005–1.030)
pH: 5 (ref 5.0–8.0)

## 2021-05-16 LAB — CBC WITH DIFFERENTIAL/PLATELET
Abs Immature Granulocytes: 0.15 10*3/uL — ABNORMAL HIGH (ref 0.00–0.07)
Basophils Absolute: 0 10*3/uL (ref 0.0–0.1)
Basophils Relative: 0 %
Eosinophils Absolute: 0 10*3/uL (ref 0.0–0.5)
Eosinophils Relative: 0 %
HCT: 47.4 % — ABNORMAL HIGH (ref 36.0–46.0)
Hemoglobin: 14.1 g/dL (ref 12.0–15.0)
Immature Granulocytes: 1 %
Lymphocytes Relative: 6 %
Lymphs Abs: 1.2 10*3/uL (ref 0.7–4.0)
MCH: 32.1 pg (ref 26.0–34.0)
MCHC: 29.7 g/dL — ABNORMAL LOW (ref 30.0–36.0)
MCV: 108 fL — ABNORMAL HIGH (ref 80.0–100.0)
Monocytes Absolute: 1.3 10*3/uL — ABNORMAL HIGH (ref 0.1–1.0)
Monocytes Relative: 7 %
Neutro Abs: 17.6 10*3/uL — ABNORMAL HIGH (ref 1.7–7.7)
Neutrophils Relative %: 86 %
Platelets: 209 10*3/uL (ref 150–400)
RBC: 4.39 MIL/uL (ref 3.87–5.11)
RDW: 16.1 % — ABNORMAL HIGH (ref 11.5–15.5)
WBC: 20.3 10*3/uL — ABNORMAL HIGH (ref 4.0–10.5)
nRBC: 0 % (ref 0.0–0.2)

## 2021-05-16 LAB — COMPREHENSIVE METABOLIC PANEL
ALT: 43 U/L (ref 0–44)
AST: 48 U/L — ABNORMAL HIGH (ref 15–41)
Albumin: 3.4 g/dL — ABNORMAL LOW (ref 3.5–5.0)
Alkaline Phosphatase: 139 U/L — ABNORMAL HIGH (ref 38–126)
Anion gap: 8 (ref 5–15)
BUN: 18 mg/dL (ref 6–20)
CO2: 31 mmol/L (ref 22–32)
Calcium: 8.2 mg/dL — ABNORMAL LOW (ref 8.9–10.3)
Chloride: 98 mmol/L (ref 98–111)
Creatinine, Ser: 1.59 mg/dL — ABNORMAL HIGH (ref 0.44–1.00)
GFR, Estimated: 39 mL/min — ABNORMAL LOW (ref >60)
Glucose, Bld: 179 mg/dL — ABNORMAL HIGH (ref 70–99)
Potassium: 5.7 mmol/L — ABNORMAL HIGH (ref 3.5–5.1)
Sodium: 137 mmol/L (ref 135–145)
Total Bilirubin: 1.4 mg/dL — ABNORMAL HIGH (ref 0.3–1.2)
Total Protein: 6.9 g/dL (ref 6.5–8.1)

## 2021-05-16 LAB — PREGNANCY, URINE: Preg Test, Ur: NEGATIVE

## 2021-05-16 LAB — BLOOD GAS, ARTERIAL
Acid-Base Excess: 4.6 mmol/L — ABNORMAL HIGH (ref 0.0–2.0)
Acid-Base Excess: 4.6 mmol/L — ABNORMAL HIGH (ref 0.0–2.0)
Bicarbonate: 38.3 mmol/L — ABNORMAL HIGH (ref 20.0–28.0)
Bicarbonate: 31.6 mmol/L — ABNORMAL HIGH (ref 20.0–28.0)
Drawn by: 22223
FIO2: 100 %
FIO2: 100 %
O2 Saturation: 97.1 %
O2 Saturation: 98.6 %
Patient temperature: 37
Patient temperature: 38.3
pCO2 arterial: 115 mmHg (ref 32–48)
pCO2 arterial: 59 mmHg — ABNORMAL HIGH (ref 32–48)
pH, Arterial: 7.13 — CL (ref 7.35–7.45)
pH, Arterial: 7.34 — ABNORMAL LOW (ref 7.35–7.45)
pO2, Arterial: 104 mmHg (ref 83–108)
pO2, Arterial: 106 mmHg (ref 83–108)

## 2021-05-16 LAB — APTT: aPTT: 34 seconds (ref 24–36)

## 2021-05-16 LAB — ETHANOL: Alcohol, Ethyl (B): <10 mg/dL (ref <10)

## 2021-05-16 LAB — ACETAMINOPHEN LEVEL: Acetaminophen (Tylenol), Serum: <10 ug/mL — ABNORMAL LOW (ref 10–30)

## 2021-05-16 LAB — PROTIME-INR
INR: 1.5 — ABNORMAL HIGH (ref 0.8–1.2)
Prothrombin Time: 17.8 seconds — ABNORMAL HIGH (ref 11.4–15.2)

## 2021-05-16 LAB — LACTIC ACID, PLASMA: Lactic Acid, Venous: 2.7 mmol/L (ref 0.5–1.9)

## 2021-05-16 LAB — SALICYLATE LEVEL: Salicylate Lvl: <7 mg/dL — ABNORMAL LOW (ref 7.0–30.0)

## 2021-05-16 LAB — AMMONIA: Ammonia: 75 umol/L — ABNORMAL HIGH (ref 9–35)

## 2021-05-16 MED ORDER — PROPOFOL 1000 MG/100ML IV EMUL: 5.0000 ug/kg/min | INTRAVENOUS | Status: DC

## 2021-05-16 MED ORDER — SODIUM CHLORIDE 0.9 % IV SOLN: INTRAVENOUS | Status: DC

## 2021-05-16 MED ORDER — PROPOFOL 1000 MG/100ML IV EMUL
INTRAVENOUS | Status: AC
  Administered 2021-05-16: 30 ug/kg/min via INTRAVENOUS
  Filled 2021-05-16: qty 100

## 2021-05-16 MED ORDER — LEVOFLOXACIN IN D5W 750 MG/150ML IV SOLN
750.0000 mg | Freq: Once | INTRAVENOUS | Status: AC
  Administered 2021-05-16: 750 mg via INTRAVENOUS
  Filled 2021-05-16: qty 150

## 2021-05-16 MED ORDER — SUCCINYLCHOLINE CHLORIDE 200 MG/10ML IV SOSY
PREFILLED_SYRINGE | INTRAVENOUS | Status: AC
  Administered 2021-05-16: 15 mg
  Filled 2021-05-16: qty 10

## 2021-05-16 MED ORDER — NOREPINEPHRINE 4 MG/250ML-% IV SOLN
0.0000 ug/min | INTRAVENOUS | Status: DC
  Administered 2021-05-17: 1 ug/min via INTRAVENOUS
  Administered 2021-05-18: 4 ug/min via INTRAVENOUS
  Administered 2021-05-19: 3 ug/min via INTRAVENOUS
  Administered 2021-05-19 – 2021-05-20 (×2): 8 ug/min via INTRAVENOUS
  Administered 2021-05-20: 5 ug/min via INTRAVENOUS
  Administered 2021-05-21: 4 ug/min via INTRAVENOUS
  Administered 2021-05-22: 2 ug/min via INTRAVENOUS
  Administered 2021-05-23: 10 ug/min via INTRAVENOUS
  Administered 2021-05-24: 4 ug/min via INTRAVENOUS
  Administered 2021-05-24: 3 ug/min via INTRAVENOUS
  Administered 2021-05-25: 1 ug/min via INTRAVENOUS
  Administered 2021-05-26: 4 ug/min via INTRAVENOUS
  Filled 2021-05-16 (×13): qty 250

## 2021-05-16 MED ORDER — NOREPINEPHRINE 4 MG/250ML-% IV SOLN
INTRAVENOUS | Status: AC
  Administered 2021-05-16: 2 ug/min via INTRAVENOUS
  Filled 2021-05-16: qty 250

## 2021-05-16 MED ORDER — PROPOFOL 1000 MG/100ML IV EMUL
5.0000 ug/kg/min | INTRAVENOUS | Status: DC
  Administered 2021-05-16: 40 ug/kg/min via INTRAVENOUS
  Administered 2021-05-17: 50 ug/kg/min via INTRAVENOUS
  Filled 2021-05-16: qty 100

## 2021-05-16 MED ORDER — ETOMIDATE 2 MG/ML IV SOLN
INTRAVENOUS | Status: AC
  Filled 2021-05-16: qty 10

## 2021-05-16 NOTE — ED Notes (Signed)
Dr. Hyacinth Meeker and respiratory at bedside to intubate pt ? ? ?150 succ given@ 2125 ?20 etomidate given @2125  ? ?Tube placed @2127  ?Positive color change  ?23 @ the lip  ?

## 2021-05-16 NOTE — ED Notes (Signed)
Date and time results received: 05/16/21 2306 ? ? ?Test: lactic ?Critical Value: 2.7 ? ?Name of Provider Notified: Sabra Heck, MD ? ? ?

## 2021-05-16 NOTE — ED Provider Notes (Signed)
?[facility] EMERGENCY DEPARTMENT ?Provider Note ? ? ?CSN: [id number] ?Arrival date & time: 05/16/21  2107 ? ?  ? ?History ? ?Chief Complaint  ?Patient presents with  ? unresponsive  ? ? ?[name] is a 51 y.o. female. ? ? ?Loss of Consciousness ? ?This patient is a 51 year old female with a history of some psychiatric abnormalities taking Xanax, amitriptyline, she also takes Lyrica, Trintellix and Ambien.  The patient takes Protonix for acid reflux and some hypertensive medications, she is also on citalopram.  It is unclear exactly what happened but a family member arrived home and found the patient to be unresponsive at home.  It is unclear whether she fell or whether she was feeling poorly earlier in the evening, the patient is not able to give [place] any information and is completely obtunded with a very low GCS.  The patient was breathing spontaneously but required bag-valve-mask assisted respirations.  She was not given any medications in the field, her blood sugar was a little bit high, she was tachycardic and was hypotensive.  IV access was not able to be established since the patient is morbidly obese. ? ?No other historians present on the patient's arrival, I did review the chart ? ?Home Medications ?Prior to Admission medications   ?Medication Sig Start Date End Date Taking? Authorizing Provider  ?albuterol (PROVENTIL HFA;VENTOLIN HFA) 108 (90 BASE) MCG/ACT inhaler Inhale 2 puffs into the lungs every 4 (four) hours as needed for wheezing or shortness of breath. 05/03/14   Ward, Layla Maw, DO  ?ALPRAZolam (XANAX) 0.5 MG tablet Take 0.5 mg by mouth 2 (two) times daily as needed for anxiety. 02/03/21   [provider]  ?ALPRAZolam Prudy Feeler) 1 MG tablet Take 1 tablet (1 mg total) by mouth 4 (four) times daily. ?Patient not taking: Reported on 02/14/2021 12/07/15   Maris Berger A, PA-C  ?amitriptyline (ELAVIL) 25 MG tablet Take 25 mg by mouth at bedtime. Takes with 50mg  tablet for a total dose of 75mg  at  bedtime 02/08/20   [provider]  ?amitriptyline (ELAVIL) 50 MG tablet Take 50 mg by mouth at bedtime. Takes with 25mg  tablet for a total dose of 75mg  at bedtime 02/03/21   [provider]  ?aspirin EC 81 MG EC tablet Take 1 tablet (81 mg total) by mouth daily. 12/08/15   , PA-C  ?aspirin-sod bicarb-citric acid (ALKA-SELTZER) 325 MG TBEF tablet Take 325 mg by mouth every 6 (six) hours as needed.    [provider]  ?atorvastatin (LIPITOR) 20 MG tablet Take 20 mg by mouth daily.    [provider]  ?bismuth subsalicylate (PEPTO BISMOL) 262 MG chewable tablet Chew 524 mg by mouth 3 (three) times daily as needed for indigestion.    [provider]  ?celecoxib (CELEBREX) 200 MG capsule Take 200 mg by mouth 2 (two) times daily. 12/26/19   [provider]  ?citalopram (CELEXA) 20 MG tablet Take 20 mg by mouth at bedtime. 01/17/21   [provider]  ?fluticasone (FLONASE) 50 MCG/ACT nasal spray Place 1 spray into both nostrils at bedtime.    [provider]  ?hydrochlorothiazide (HYDRODIURIL) 12.5 MG tablet Take 12.5 mg by mouth daily. 12/18/19   [provider]  ?ketoconazole (NIZORAL) 2 % shampoo Apply 1 application topically 2 (two) times a week. 01/28/21   [provider]  ?lisinopril (PRINIVIL,ZESTRIL) 20 MG tablet Take 1 tablet (20 mg total) by mouth daily. 12/14/15   03/17/21, MD  ?  magnesium oxide (MAG-OX) 400 MG tablet Take 400 mg by mouth daily.    [provider]  ?methocarbamol (ROBAXIN) 750 MG tablet Take 750 mg by mouth every 6 (six) hours as needed for muscle spasms.    [provider]  ?NON FORMULARY NERVIVE (nerve relief)    [provider]  ?ondansetron (ZOFRAN ODT) 4 MG disintegrating tablet Take 1 tablet (4 mg total) by mouth every 8 (eight) hours as needed for nausea or vomiting. 02/17/20   Mannie Stabile, PA-C  ?pantoprazole (PROTONIX) 40 MG tablet Take 40 mg by mouth  daily.    [provider]  ?potassium chloride SA (K-DUR,KLOR-CON) 20 MEQ tablet Take 1 tablet (20 mEq total) by mouth daily. 09/02/16 02/14/21  Catarina Hartshorn, MD  ?pregabalin (LYRICA) 200 MG capsule Take 200 mg by mouth in the morning, at noon, and at bedtime. 01/04/20   [provider]  ?sucralfate (CARAFATE) 1 g tablet Take 1 tablet (1 g total) by mouth 2 (two) times daily. 02/17/20   Mannie Stabile, PA-C  ?TRINTELLIX 10 MG TABS tablet Take 10 mg by mouth daily. 01/01/20   [provider]  ?Vitamin D, Ergocalciferol, (DRISDOL) 1.25 MG (50000 UNIT) CAPS capsule Take 50,000 Units by mouth 2 (two) times a week. 02/16/20   [provider]  ?VYVANSE 50 MG capsule Take 50 mg by mouth every morning. 01/22/20   [provider]  ?zolpidem (AMBIEN) 10 MG tablet Take 10 mg by mouth at bedtime. 01/22/20   [provider]  ?   ? ?Allergies    ?Penicillins, Ibuprofen, Peppermint flavor [flavoring agent], and Aspirin   ? ?Review of Systems   ?Review of Systems  ?Unable to perform ROS: Acuity of condition  ?Cardiovascular:  Positive for syncope.  ? ?Physical Exam ?Updated Vital Signs ?BP (!) 109/92   Pulse (!) 122   Temp (!) 100.8 ?F (38.2 ?C)   Resp 11   Ht 1.6 m (5\' 3" )   Wt 94 kg   SpO2 100%   BMI 36.71 kg/m?  ?Physical Exam ?Vitals and nursing note reviewed.  ?Constitutional:   ?   General: She is in acute distress.  ?   Appearance: She is well-developed. She is ill-appearing and toxic-appearing.  ?HENT:  ?   Head: Normocephalic and atraumatic.  ?   Nose: No congestion or rhinorrhea.  ?   Mouth/Throat:  ?   Mouth: Mucous membranes are dry.  ?   Pharynx: No oropharyngeal exudate.  ?Eyes:  ?   General: No scleral icterus.    ?   Right eye: No discharge.     ?   Left eye: No discharge.  ?   Conjunctiva/sclera: Conjunctivae normal.  ?   Pupils: Pupils are equal, round, and reactive to light.  ?   Comments: Pupils 3 mm symmetrical and reactive  ?Neck:  ?   Thyroid: No  thyromegaly.  ?   Vascular: No JVD.  ?Cardiovascular:  ?   Rate and Rhythm: Regular rhythm. Tachycardia present.  ?   Heart sounds: Normal heart sounds. No murmur heard. ?  No friction rub. No gallop.  ?   Comments: Tachycardic to about 120 beats a minute ?Pulmonary:  ?   Effort: Respiratory distress present.  ?   Breath sounds: Wheezing, rhonchi and rales present.  ?   Comments: Shallow breathing, assisted respirations with bag-valve-mask, breathing approximately 20 times a minute, very shallow ?Abdominal:  ?   General: Bowel sounds are normal.  There is no distension.  ?   Palpations: Abdomen is soft. There is no mass.  ?   Tenderness: There is no abdominal tenderness.  ?Musculoskeletal:     ?   General: No tenderness. Normal range of motion.  ?   Cervical back: Normal range of motion and neck supple.  ?   Right lower leg: No edema.  ?   Left lower leg: No edema.  ?Lymphadenopathy:  ?   Cervical: No cervical adenopathy.  ?Skin: ?   General: Skin is warm and dry.  ?   Findings: No erythema or rash.  ?Neurological:  ?   Comments: To deep painful stimuli the patient will grimace, she does not move either arm or either leg, she does not open her eyes to command to voice only to pain.  She does not speak.  ?Psychiatric:     ?   Behavior: Behavior normal.  ? ? ?ED Results / Procedures / Treatments   ?Labs ?(all labs ordered are listed, but only abnormal results are displayed) ?Labs Reviewed  ?BLOOD GAS, ARTERIAL - Abnormal; Notable for the following components:  ?    Result Value  ? pH, Arterial 7.13 (*)   ? pCO2 arterial 115 (*)   ? Bicarbonate 38.3 (*)   ? Acid-Base Excess 4.6 (*)   ? All other components within normal limits  ?COMPREHENSIVE METABOLIC PANEL - Abnormal; Notable for the following components:  ? Potassium 5.7 (*)   ? Glucose, Bld 179 (*)   ? Creatinine, Ser 1.59 (*)   ? Calcium 8.2 (*)   ? Albumin 3.4 (*)   ? AST 48 (*)   ? Alkaline Phosphatase 139 (*)   ? Total Bilirubin 1.4 (*)   ? GFR, Estimated 39 (*)    ? All other components within normal limits  ?CBC WITH DIFFERENTIAL/PLATELET - Abnormal; Notable for the following components:  ? WBC 20.3 (*)   ? HCT 47.4 (*)   ? MCV 108.0 (*)   ? MCHC 29.7 (*)   ? RDW 16.1 (

## 2021-05-16 NOTE — H&P (Addendum)
? ?NAME:  [name], MRN:  [id number], DOB:  1970-11-24, LOS: 0 ?ADMISSION DATE:  05/16/2021, CONSULTATION DATE:  5/5 ?REFERRING MD:  Dr. Sabra Heck, CHIEF COMPLAINT:  Acute respiratory failure w/ hypercapnea/hypoxia ? ?History of Present Illness:  ?Patient is a 51 year old female with pertinent PMH of anxiety, depression, chronic pain syndrome on multiple psych meds presents to [facility] on 5/5 with acute respiratory failure. ? ?Patient recently admitted 02/14/2021 with similar episode of respiratory failure with sepsis and aspiration pneumonia and patient was intubated.  Patient left AMA on 02/21/2021.  Patient at home takes multiple psych meds: Xanax, amitriptyline, Lyrica, Trintellix, Ambien, citalopram. ? ?On 5/5, family went to see patient at home and found unresponsive on floor.  EMS called and patient was unresponsive and sats in 58s.  Patient bagged and transported to [facility] ED.  On arrival to [facility] ED patient GCS very low.  Breathing spontaneously but requiring BVM.  Tachycardia 120s with initial BP 76/62.  BS with wheezing, rhonchi, rales.  Patient given multiple doses of Narcan with no improvement.  Patient was intubated.  Initial ABG 7.13, 115, 104, 38.  Settings adjusted now ABG 7.34, 59, 106, 31.  Tmax 100.9 ?F.  CBG 179.  Central line placed.  CXR with ETT in appropriate position; no consolidation.  CT head with no acute abnormality.  Pertinent labs: K5.7, glucose 179, creat 1.59, WBC 20.3.  UA with rare bacteria.  UDS positive for benzos and amphetamines.  Ethanol, acetaminophen, salicylate level WNL.  Ammonia 75.  LA 2.7. cultures pending.  COVID flu negative. ? ?Pertinent  Medical History  ? ?Past Medical History:  ?Diagnosis Date  ? Anxiety   ? Back pain   ? Chronic back pain 02/04/13  ? By patient report  ? Depression   ? Factor 5 Leiden mutation, heterozygous ([place])   ? Hypertension   ? Laryngitis, chronic   ? Panic attack   ? Scoliosis   ? Ulcer   ? ? ? ?Significant Hospital Events: ?Including procedures,  antibiotic start and stop dates in addition to other pertinent events   ?5/5 admitted to [facility] intubated for resp failure; transferring to [facility]  ? ?Interim History / Subjective:  ?Patient intubated on Mech vent ?sedated with propofol; not responding to painful stimuli; PERRL 2 mm pupils bilaterally; cough/gag present ?On 8 mcg levo ? ?Objective   ?Blood pressure (!) 129/106, pulse (!) 121, temperature (!) 100.6 ?F (38.1 ?C), resp. rate 20, height 5\' 3"  (1.6 m), weight 94 kg, SpO2 99 %. ?   ?Vent Mode: PRVC ?FiO2 (%):  [100 %] 100 % ?Set Rate:  [22 bmp-26 bmp] 26 bmp ?Vt Set:  [420 mL] 420 mL ?PEEP:  [5 cmH20] 5 cmH20 ?Plateau Pressure:  [25 cmH20] 25 cmH20  ?No intake or output data in the 24 hours ending 05/16/21 2353 ?Filed Weights  ? 05/16/21 2143  ?Weight: 94 kg  ? ? ?Examination: ?General:  critically ill appearing on mech vent ?HEENT: MM pink/moist; ETT in place ?Neuro: sedated with propofol; not responding to painful stimuli; PERRL 2 mm pupils bilaterally; cough/gag present ?CV: s1s2, no m/r/g ?PULM:  dim clear BS bilaterally; intubated on mech vent PRVC ?GI: soft, bsx4 active  ?Extremities: warm/dry, trace BLE edema ; R femoral CVC in place ?Skin: no rashes or lesions appreciated ? ? ?Resolved Hospital Problem list   ? ? ?Assessment & Plan:  ? ?Acute respiratory failure with hypoxia/hypercapnia ?P: ?-LTVV strategy with tidal volumes of 6-8 cc/kg ideal body weight ?-Wean  PEEP/FiO2 for SpO2 >92% ?-Check CXR and ABG ?-VAP bundle in place ?-Daily SAT and SBT ?-PAD protocol in place ?-wean sedation for RASS goal 0 to -1 ?-check trach culture ? ?Shock: Likely septic ?P: ?-continue levo for MAP goal >65 ?-Consider IV fluids ?-trend troponin and lactate ?-Consider echo ?-check cultures: bcx2, urine culture/UA, and trach culture ?-will start on broad spectrum antibiotics: aztreonam (pcn allergy) and vanc ?-check procalcitonin and mrsa pcr ?-Trend WBC/fever curve ? ?Acute encephalopathy: Likely due to hypercarbia and UDS  positive for benzos and amphetamines.  CT head negative; ethanol, acetaminophen, salicylate WNL ?Hyperammonemia ?P: ?-Limit sedating meds; frequent neurochecks ?-Give lactulose ?-Vent settings adjusted to reduce CO2 ?-wean sedation for rass 0 to -1 ?-f/u UC ? ?Hyperkalemia ?AKI ?P: ?-will give fluid Lokelma, insulin/dextrose, calcium gluconate ?-check cmp in a few hours ?-check mag ?-Trend BMP / urinary output ?-Replace electrolytes as indicated ?-Avoid nephrotoxic agents, ensure adequate renal perfusion ? ?Mildly elevated LFTs ?Hyperammonemia ?P: ?-will give lactulose ?-Trend ammonia and CMP ? ?Hx of depression and anxiety ?Chronic pain syndrome ?P: ?-Hold home meds for now ? ?Hx of HTN ?P: ?-Hold home antihypertensives in setting of hypertension ? ?GERD with history of gastritis ?P: ?-PPI and continue home sucralfate ? ?Hx of Factor V Leyden mutation ?P: ?-heparin dvt ppx ? ? ?Best Practice (right click and "Reselect all SmartList Selections" daily)  ? ?Diet/type: NPO w/ meds via tube ?DVT prophylaxis: prophylactic heparin  ?GI prophylaxis: PPI ?Lines: Central line ?Foley:  Yes, and it is still needed ?Code Status:  full code ?Last date of multidisciplinary goals of care discussion [5/6 attempted to reach Amber over phone but unable to reach] ? ?Labs   ?CBC: ?Recent Labs  ?Lab 05/16/21 ?2130  ?WBC 20.3*  ?NEUTROABS 17.6*  ?HGB 14.1  ?HCT 47.4*  ?MCV 108.0*  ?PLT 209  ? ? ?Basic Metabolic Panel: ?Recent Labs  ?Lab 05/16/21 ?2130  ?NA 137  ?K 5.7*  ?CL 98  ?CO2 31  ?GLUCOSE 179*  ?BUN 18  ?CREATININE 1.59*  ?CALCIUM 8.2*  ? ?GFR: ?Estimated Creatinine Clearance: 46.1 mL/min (A) (by C-G formula based on SCr of 1.59 mg/dL (H)). ?Recent Labs  ?Lab 05/16/21 ?2130  ?WBC 20.3*  ?LATICACIDVEN 2.7*  ? ? ?Liver Function Tests: ?Recent Labs  ?Lab 05/16/21 ?2130  ?AST 48*  ?ALT 43  ?ALKPHOS 139*  ?BILITOT 1.4*  ?PROT 6.9  ?ALBUMIN 3.4*  ? ?No results for input(s): LIPASE, AMYLASE in the last 168 hours. ?Recent Labs  ?Lab  05/16/21 ?2242  ?AMMONIA 75*  ? ? ?ABG ?   ?Component Value Date/Time  ? PHART 7.34 (L) 05/16/2021 2220  ? PCO2ART 59 (H) 05/16/2021 2220  ? PO2ART 106 05/16/2021 2220  ? HCO3 31.6 (H) 05/16/2021 2220  ? TCO2 32 02/15/2021 1445  ? O2SAT 98.6 05/16/2021 2220  ?  ? ?Coagulation Profile: ?Recent Labs  ?Lab 05/16/21 ?2130  ?INR 1.5*  ? ? ?Cardiac Enzymes: ?No results for input(s): CKTOTAL, CKMB, CKMBINDEX, TROPONINI in the last 168 hours. ? ?HbA1C: ?Hgb A1c MFr Bld  ?Date/Time Value Ref Range Status  ?02/14/2021 02:21 PM 5.8 (H) 4.8 - 5.6 % Final  ?  Comment:  ?  (NOTE) ?Pre diabetes:          5.7%-6.4% ? ?Diabetes:              >6.4% ? ?Glycemic control for   <7.0% ?adults with diabetes ?  ?08/31/2016 08:50 AM 5.2 4.8 - 5.6 % Final  ?  Comment:  ?  (NOTE) ?Pre diabetes:          5.7%-6.4% ?Diabetes:              >6.4% ?Glycemic control for   <7.0% ?adults with diabetes ?  ? ? ?CBG: ?No results for input(s): GLUCAP in the last 168 hours. ? ?Review of Systems:   ?Patient is encephalopathic and/or intubated. Therefore history has been obtained from chart review.  ? ? ?Past Medical History:  ?She,  has a past medical history of Anxiety, Back pain, Chronic back pain (02/04/13), Depression, Factor 5 Leiden mutation, heterozygous ([place]), Hypertension, Laryngitis, chronic, Panic attack, Scoliosis, and Ulcer.  ? ?Surgical History:  ? ?Past Surgical History:  ?Procedure Laterality Date  ? BACK SURGERY    ? CESAREAN SECTION    ? CHOLECYSTECTOMY  11/27/2011  ? Procedure: LAPAROSCOPIC CHOLECYSTECTOMY;  Surgeon: Donato Heinz, MD;  Location: [facility]S;  Service: General;  Laterality: N/A;  ? CRYOTHERAPY  02/17/2021  ? Procedure: CRYOTHERAPY;  Surgeon: Candee Furbish, MD;  Location: [facility] ENDOSCOPY;  Service: Pulmonary;;  ? EMBOLECTOMY Left 12/06/2015  ? Procedure: EMBOLECTOMY LEFT ARM;  Surgeon: Elam Dutch, MD;  Location: [facility] OR;  Service: Vascular;  Laterality: Left;  ? NECK SURGERY    ? TUBAL LIGATION    ? VIDEO BRONCHOSCOPY Right  02/17/2021  ? Procedure: VIDEO BRONCHOSCOPY WITHOUT FLUORO;  Surgeon: Candee Furbish, MD;  Location: [facility] ENDOSCOPY;  Service: Pulmonary;  Laterality: Right;  with cryo/ basket  ?  ? ?Social History:  ? reports t

## 2021-05-16 NOTE — ED Notes (Signed)
Report given to care Link ?

## 2021-05-16 NOTE — ED Triage Notes (Signed)
Pt arrived from home via [facility]. Called out for unconscious. EMS states o2 in 70's on room air, cyanotic and agonal respirations. EMS assisted ventilations ?

## 2021-05-16 NOTE — ED Notes (Signed)
Patient transported to CT 

## 2021-05-17 ENCOUNTER — Inpatient Hospital Stay (HOSPITAL_COMMUNITY): Payer: Medicaid Other

## 2021-05-17 DIAGNOSIS — N179 Acute kidney failure, unspecified: Secondary | ICD-10-CM | POA: Diagnosis not present

## 2021-05-17 DIAGNOSIS — E7220 Disorder of urea cycle metabolism, unspecified: Secondary | ICD-10-CM | POA: Diagnosis not present

## 2021-05-17 DIAGNOSIS — Z452 Encounter for adjustment and management of vascular access device: Secondary | ICD-10-CM | POA: Diagnosis not present

## 2021-05-17 DIAGNOSIS — J811 Chronic pulmonary edema: Secondary | ICD-10-CM | POA: Diagnosis not present

## 2021-05-17 DIAGNOSIS — E875 Hyperkalemia: Secondary | ICD-10-CM

## 2021-05-17 DIAGNOSIS — A419 Sepsis, unspecified organism: Secondary | ICD-10-CM

## 2021-05-17 DIAGNOSIS — J80 Acute respiratory distress syndrome: Secondary | ICD-10-CM | POA: Diagnosis not present

## 2021-05-17 DIAGNOSIS — J9602 Acute respiratory failure with hypercapnia: Secondary | ICD-10-CM

## 2021-05-17 DIAGNOSIS — G9340 Encephalopathy, unspecified: Secondary | ICD-10-CM | POA: Diagnosis not present

## 2021-05-17 DIAGNOSIS — Z4682 Encounter for fitting and adjustment of non-vascular catheter: Secondary | ICD-10-CM | POA: Diagnosis not present

## 2021-05-17 DIAGNOSIS — J90 Pleural effusion, not elsewhere classified: Secondary | ICD-10-CM | POA: Diagnosis not present

## 2021-05-17 DIAGNOSIS — R6521 Severe sepsis with septic shock: Secondary | ICD-10-CM | POA: Diagnosis not present

## 2021-05-17 LAB — POCT I-STAT 7, (LYTES, BLD GAS, ICA,H+H)
Acid-Base Excess: 9 mmol/L — ABNORMAL HIGH (ref 0.0–2.0)
Bicarbonate: 33.1 mmol/L — ABNORMAL HIGH (ref 20.0–28.0)
Calcium, Ion: 1.16 mmol/L (ref 1.15–1.40)
HCT: 44 % (ref 36.0–46.0)
Hemoglobin: 15 g/dL (ref 12.0–15.0)
O2 Saturation: 96 %
Patient temperature: 99.3
Potassium: 4.3 mmol/L (ref 3.5–5.1)
Sodium: 135 mmol/L (ref 135–145)
TCO2: 34 mmol/L — ABNORMAL HIGH (ref 22–32)
pCO2 arterial: 43.2 mmHg (ref 32–48)
pH, Arterial: 7.493 — ABNORMAL HIGH (ref 7.35–7.45)
pO2, Arterial: 79 mmHg — ABNORMAL LOW (ref 83–108)

## 2021-05-17 LAB — HEPATITIS PANEL, ACUTE
HCV Ab: NONREACTIVE
Hep A IgM: NONREACTIVE
Hep B C IgM: NONREACTIVE
Hepatitis B Surface Ag: NONREACTIVE

## 2021-05-17 LAB — CBC
HCT: 47.1 % — ABNORMAL HIGH (ref 36.0–46.0)
HCT: 43.2 % (ref 36.0–46.0)
Hemoglobin: 15.4 g/dL — ABNORMAL HIGH (ref 12.0–15.0)
Hemoglobin: 14.2 g/dL (ref 12.0–15.0)
MCH: 33.6 pg (ref 26.0–34.0)
MCH: 33 pg (ref 26.0–34.0)
MCHC: 32.7 g/dL (ref 30.0–36.0)
MCHC: 32.9 g/dL (ref 30.0–36.0)
MCV: 102.8 fL — ABNORMAL HIGH (ref 80.0–100.0)
MCV: 100.5 fL — ABNORMAL HIGH (ref 80.0–100.0)
Platelets: 226 10*3/uL (ref 150–400)
Platelets: 186 10*3/uL (ref 150–400)
RBC: 4.58 MIL/uL (ref 3.87–5.11)
RBC: 4.3 MIL/uL (ref 3.87–5.11)
RDW: 15.7 % — ABNORMAL HIGH (ref 11.5–15.5)
RDW: 15.7 % — ABNORMAL HIGH (ref 11.5–15.5)
WBC: 22.2 10*3/uL — ABNORMAL HIGH (ref 4.0–10.5)
WBC: 18 10*3/uL — ABNORMAL HIGH (ref 4.0–10.5)
nRBC: 0.1 % (ref 0.0–0.2)
nRBC: 0 % (ref 0.0–0.2)

## 2021-05-17 LAB — TROPONIN I (HIGH SENSITIVITY)
Troponin I (High Sensitivity): 64 ng/L — ABNORMAL HIGH (ref <18)
Troponin I (High Sensitivity): 55 ng/L — ABNORMAL HIGH (ref <18)

## 2021-05-17 LAB — COMPREHENSIVE METABOLIC PANEL
ALT: 37 U/L (ref 0–44)
AST: 46 U/L — ABNORMAL HIGH (ref 15–41)
Albumin: 2.6 g/dL — ABNORMAL LOW (ref 3.5–5.0)
Alkaline Phosphatase: 115 U/L (ref 38–126)
Anion gap: 8 (ref 5–15)
BUN: 19 mg/dL (ref 6–20)
CO2: 28 mmol/L (ref 22–32)
Calcium: 8.7 mg/dL — ABNORMAL LOW (ref 8.9–10.3)
Chloride: 101 mmol/L (ref 98–111)
Creatinine, Ser: 1.59 mg/dL — ABNORMAL HIGH (ref 0.44–1.00)
GFR, Estimated: 39 mL/min — ABNORMAL LOW (ref >60)
Glucose, Bld: 101 mg/dL — ABNORMAL HIGH (ref 70–99)
Potassium: 4.9 mmol/L (ref 3.5–5.1)
Sodium: 137 mmol/L (ref 135–145)
Total Bilirubin: 1.7 mg/dL — ABNORMAL HIGH (ref 0.3–1.2)
Total Protein: 5.3 g/dL — ABNORMAL LOW (ref 6.5–8.1)

## 2021-05-17 LAB — MAGNESIUM: Magnesium: 2.2 mg/dL (ref 1.7–2.4)

## 2021-05-17 LAB — LACTIC ACID, PLASMA
Lactic Acid, Venous: 2.3 mmol/L (ref 0.5–1.9)
Lactic Acid, Venous: 2 mmol/L (ref 0.5–1.9)
Lactic Acid, Venous: 2.3 mmol/L (ref 0.5–1.9)
Lactic Acid, Venous: 2 mmol/L (ref 0.5–1.9)

## 2021-05-17 LAB — GLUCOSE, CAPILLARY
Glucose-Capillary: 109 mg/dL — ABNORMAL HIGH (ref 70–99)
Glucose-Capillary: 130 mg/dL — ABNORMAL HIGH (ref 70–99)
Glucose-Capillary: 141 mg/dL — ABNORMAL HIGH (ref 70–99)
Glucose-Capillary: 149 mg/dL — ABNORMAL HIGH (ref 70–99)
Glucose-Capillary: 164 mg/dL — ABNORMAL HIGH (ref 70–99)
Glucose-Capillary: 165 mg/dL — ABNORMAL HIGH (ref 70–99)
Glucose-Capillary: 88 mg/dL (ref 70–99)

## 2021-05-17 LAB — HEPARIN LEVEL (UNFRACTIONATED): Heparin Unfractionated: <0.1 IU/mL — ABNORMAL LOW (ref 0.30–0.70)

## 2021-05-17 LAB — RESP PANEL BY RT-PCR (FLU A&B, COVID) ARPGX2
Influenza A by PCR: NEGATIVE
Influenza B by PCR: NEGATIVE
SARS Coronavirus 2 by RT PCR: NEGATIVE

## 2021-05-17 LAB — MRSA NEXT GEN BY PCR, NASAL: MRSA by PCR Next Gen: NOT DETECTED

## 2021-05-17 LAB — PHOSPHORUS: Phosphorus: 3.5 mg/dL (ref 2.5–4.6)

## 2021-05-17 LAB — PROCALCITONIN: Procalcitonin: 0.9 ng/mL

## 2021-05-17 LAB — BRAIN NATRIURETIC PEPTIDE: B Natriuretic Peptide: 782.6 pg/mL — ABNORMAL HIGH (ref 0.0–100.0)

## 2021-05-17 MED ORDER — POLYETHYLENE GLYCOL 3350 17 G PO PACK
17.0000 g | PACK | Freq: Every day | ORAL | Status: DC
  Administered 2021-05-17 – 2021-05-27 (×9): 17 g
  Filled 2021-05-17 (×10): qty 1

## 2021-05-17 MED ORDER — HEPARIN (PORCINE) 25000 UT/250ML-% IV SOLN
1600.0000 [IU]/h | INTRAVENOUS | Status: DC
  Administered 2021-05-17: 1250 [IU]/h via INTRAVENOUS
  Administered 2021-05-18: 1600 [IU]/h via INTRAVENOUS
  Administered 2021-05-18: 1550 [IU]/h via INTRAVENOUS
  Administered 2021-05-19 – 2021-05-20 (×2): 1600 [IU]/h via INTRAVENOUS
  Filled 2021-05-17 (×5): qty 250

## 2021-05-17 MED ORDER — FENTANYL CITRATE (PF) 100 MCG/2ML IJ SOLN: 50.0000 ug | Freq: Once | INTRAMUSCULAR | Status: DC

## 2021-05-17 MED ORDER — DEXTROSE 50 % IV SOLN
50.0000 mL | Freq: Once | INTRAVENOUS | Status: AC
  Administered 2021-05-17: 50 mL via INTRAVENOUS
  Filled 2021-05-17: qty 50

## 2021-05-17 MED ORDER — ORAL CARE MOUTH RINSE
15.0000 mL | OROMUCOSAL | Status: DC
  Administered 2021-05-17 – 2021-05-27 (×105): 15 mL via OROMUCOSAL

## 2021-05-17 MED ORDER — INSULIN ASPART 100 UNIT/ML IJ SOLN: 2.0000 [IU] | INTRAMUSCULAR | Status: DC

## 2021-05-17 MED ORDER — VANCOMYCIN HCL 1500 MG/300ML IV SOLN
1500.0000 mg | Freq: Once | INTRAVENOUS | Status: AC
  Administered 2021-05-17: 1500 mg via INTRAVENOUS
  Filled 2021-05-17: qty 300

## 2021-05-17 MED ORDER — HEPARIN SODIUM (PORCINE) 5000 UNIT/ML IJ SOLN
5000.0000 [IU] | Freq: Three times a day (TID) | INTRAMUSCULAR | Status: DC
  Administered 2021-05-17 (×2): 5000 [IU] via SUBCUTANEOUS
  Filled 2021-05-17 (×2): qty 1

## 2021-05-17 MED ORDER — IPRATROPIUM-ALBUTEROL 0.5-2.5 (3) MG/3ML IN SOLN
3.0000 mL | Freq: Four times a day (QID) | RESPIRATORY_TRACT | Status: DC | PRN
  Administered 2021-05-17 – 2021-05-22 (×3): 3 mL via RESPIRATORY_TRACT
  Filled 2021-05-17 (×3): qty 3

## 2021-05-17 MED ORDER — DOCUSATE SODIUM 50 MG/5ML PO LIQD
100.0000 mg | Freq: Two times a day (BID) | ORAL | Status: DC
  Administered 2021-05-17 – 2021-05-21 (×10): 100 mg
  Filled 2021-05-17 (×9): qty 10

## 2021-05-17 MED ORDER — CALCIUM GLUCONATE-NACL 1-0.675 GM/50ML-% IV SOLN
1.0000 g | Freq: Once | INTRAVENOUS | Status: AC
  Administered 2021-05-17: 1000 mg via INTRAVENOUS
  Filled 2021-05-17: qty 50

## 2021-05-17 MED ORDER — DEXTROSE IN LACTATED RINGERS 5 % IV SOLN: INTRAVENOUS | Status: DC

## 2021-05-17 MED ORDER — DEXMEDETOMIDINE HCL IN NACL 400 MCG/100ML IV SOLN
0.0000 ug/kg/h | INTRAVENOUS | Status: DC
  Administered 2021-05-17: 0.6 ug/kg/h via INTRAVENOUS
  Administered 2021-05-17: 0.8 ug/kg/h via INTRAVENOUS
  Administered 2021-05-17: 0.4 ug/kg/h via INTRAVENOUS
  Administered 2021-05-18: 0.8 ug/kg/h via INTRAVENOUS
  Administered 2021-05-18: 1.2 ug/kg/h via INTRAVENOUS
  Administered 2021-05-18: 0.8 ug/kg/h via INTRAVENOUS
  Administered 2021-05-18: 1.2 ug/kg/h via INTRAVENOUS
  Filled 2021-05-17 (×7): qty 100

## 2021-05-17 MED ORDER — VANCOMYCIN HCL 750 MG/150ML IV SOLN
750.0000 mg | INTRAVENOUS | Status: DC
  Administered 2021-05-18 – 2021-05-19 (×2): 750 mg via INTRAVENOUS
  Filled 2021-05-17 (×2): qty 150

## 2021-05-17 MED ORDER — LACTULOSE 10 GM/15ML PO SOLN
30.0000 g | Freq: Once | ORAL | Status: AC
  Administered 2021-05-17: 30 g
  Filled 2021-05-17: qty 45

## 2021-05-17 MED ORDER — ASPIRIN 81 MG PO CHEW
81.0000 mg | CHEWABLE_TABLET | Freq: Every day | ORAL | Status: DC
  Administered 2021-05-17 – 2021-05-27 (×11): 81 mg
  Filled 2021-05-17 (×11): qty 1

## 2021-05-17 MED ORDER — LACTULOSE 10 GM/15ML PO SOLN
10.0000 g | Freq: Three times a day (TID) | ORAL | Status: DC
  Administered 2021-05-17 – 2021-05-22 (×18): 10 g
  Filled 2021-05-17 (×18): qty 15

## 2021-05-17 MED ORDER — INSULIN ASPART 100 UNIT/ML IV SOLN
10.0000 [IU] | Freq: Once | INTRAVENOUS | Status: AC
  Administered 2021-05-17: 10 [IU] via INTRAVENOUS

## 2021-05-17 MED ORDER — SODIUM CHLORIDE 0.9% FLUSH
10.0000 mL | Freq: Two times a day (BID) | INTRAVENOUS | Status: DC
  Administered 2021-05-17 – 2021-05-18 (×3): 10 mL
  Administered 2021-05-19: 40 mL
  Administered 2021-05-19 – 2021-05-20 (×2): 10 mL
  Administered 2021-05-20: 40 mL
  Administered 2021-05-21 – 2021-05-23 (×5): 10 mL

## 2021-05-17 MED ORDER — FENTANYL 2500MCG IN NS 250ML (10MCG/ML) PREMIX INFUSION
INTRAVENOUS | Status: AC
  Filled 2021-05-17: qty 250

## 2021-05-17 MED ORDER — HEPARIN BOLUS VIA INFUSION
2000.0000 [IU] | Freq: Once | INTRAVENOUS | Status: AC
  Administered 2021-05-17: 2000 [IU] via INTRAVENOUS
  Filled 2021-05-17: qty 2000

## 2021-05-17 MED ORDER — SUCRALFATE 1 G PO TABS
1.0000 g | ORAL_TABLET | Freq: Two times a day (BID) | ORAL | Status: DC
  Administered 2021-05-17 – 2021-05-23 (×14): 1 g
  Filled 2021-05-17 (×16): qty 1

## 2021-05-17 MED ORDER — PANTOPRAZOLE 2 MG/ML SUSPENSION
40.0000 mg | Freq: Every day | ORAL | Status: DC
  Administered 2021-05-17 – 2021-05-27 (×11): 40 mg
  Filled 2021-05-17 (×11): qty 20

## 2021-05-17 MED ORDER — CHLORHEXIDINE GLUCONATE 0.12% ORAL RINSE (MEDLINE KIT)
15.0000 mL | Freq: Two times a day (BID) | OROMUCOSAL | Status: DC
  Administered 2021-05-17 – 2021-05-27 (×22): 15 mL via OROMUCOSAL

## 2021-05-17 MED ORDER — SODIUM ZIRCONIUM CYCLOSILICATE 10 G PO PACK
10.0000 g | PACK | Freq: Once | ORAL | Status: AC
  Administered 2021-05-17: 10 g
  Filled 2021-05-17: qty 1

## 2021-05-17 MED ORDER — SODIUM CHLORIDE 0.9 % IV SOLN
1.0000 g | Freq: Three times a day (TID) | INTRAVENOUS | Status: DC
  Administered 2021-05-17 – 2021-05-19 (×7): 1 g via INTRAVENOUS
  Filled 2021-05-17 (×9): qty 5

## 2021-05-17 MED ORDER — SODIUM CHLORIDE 0.9% FLUSH: 10.0000 mL | INTRAVENOUS | Status: DC | PRN

## 2021-05-17 MED ORDER — CHLORHEXIDINE GLUCONATE CLOTH 2 % EX PADS
6.0000 | MEDICATED_PAD | Freq: Every day | CUTANEOUS | Status: DC
  Administered 2021-05-17 – 2021-06-02 (×15): 6 via TOPICAL

## 2021-05-17 MED ORDER — ATORVASTATIN CALCIUM 10 MG PO TABS
20.0000 mg | ORAL_TABLET | Freq: Every day | ORAL | Status: DC
  Administered 2021-05-17 – 2021-05-27 (×11): 20 mg
  Filled 2021-05-17 (×11): qty 2

## 2021-05-17 MED ORDER — FENTANYL BOLUS VIA INFUSION
50.0000 ug | INTRAVENOUS | Status: DC | PRN
  Administered 2021-05-17: 100 ug via INTRAVENOUS
  Administered 2021-05-17 (×3): 50 ug via INTRAVENOUS
  Administered 2021-05-19 – 2021-05-24 (×22): 100 ug via INTRAVENOUS
  Administered 2021-05-25 – 2021-05-26 (×2): 50 ug via INTRAVENOUS
  Administered 2021-05-27: 100 ug via INTRAVENOUS
  Filled 2021-05-17: qty 100

## 2021-05-17 MED ORDER — FENTANYL 2500MCG IN NS 250ML (10MCG/ML) PREMIX INFUSION
50.0000 ug/h | INTRAVENOUS | Status: DC
  Administered 2021-05-17: 50 ug/h via INTRAVENOUS
  Administered 2021-05-17: 200 ug/h via INTRAVENOUS
  Administered 2021-05-18: 250 ug/h via INTRAVENOUS
  Administered 2021-05-18: 300 ug/h via INTRAVENOUS
  Administered 2021-05-19: 325 ug/h via INTRAVENOUS
  Administered 2021-05-19 – 2021-05-20 (×3): 350 ug/h via INTRAVENOUS
  Administered 2021-05-20: 325 ug/h via INTRAVENOUS
  Administered 2021-05-20: 350 ug/h via INTRAVENOUS
  Administered 2021-05-20: 325 ug/h via INTRAVENOUS
  Administered 2021-05-21: 300 ug/h via INTRAVENOUS
  Administered 2021-05-21: 325 ug/h via INTRAVENOUS
  Administered 2021-05-21 – 2021-05-22 (×2): 300 ug/h via INTRAVENOUS
  Filled 2021-05-17 (×14): qty 250

## 2021-05-17 MED ORDER — PROPOFOL 1000 MG/100ML IV EMUL
0.0000 ug/kg/min | INTRAVENOUS | Status: DC
  Administered 2021-05-17: 25 ug/kg/min via INTRAVENOUS
  Administered 2021-05-17: 15.071 ug/kg/min via INTRAVENOUS
  Administered 2021-05-18: 5 ug/kg/min via INTRAVENOUS
  Administered 2021-05-18 (×2): 40 ug/kg/min via INTRAVENOUS
  Administered 2021-05-19 (×2): 50 ug/kg/min via INTRAVENOUS
  Administered 2021-05-19: 40 ug/kg/min via INTRAVENOUS
  Administered 2021-05-19: 50 ug/kg/min via INTRAVENOUS
  Administered 2021-05-19 (×2): 40 ug/kg/min via INTRAVENOUS
  Administered 2021-05-20 (×8): 50 ug/kg/min via INTRAVENOUS
  Administered 2021-05-21: 35 ug/kg/min via INTRAVENOUS
  Administered 2021-05-21: 45 ug/kg/min via INTRAVENOUS
  Administered 2021-05-21: 40 ug/kg/min via INTRAVENOUS
  Administered 2021-05-21: 35 ug/kg/min via INTRAVENOUS
  Administered 2021-05-21: 50 ug/kg/min via INTRAVENOUS
  Administered 2021-05-22: 35 ug/kg/min via INTRAVENOUS
  Administered 2021-05-22: 45 ug/kg/min via INTRAVENOUS
  Administered 2021-05-22: 50 ug/kg/min via INTRAVENOUS
  Administered 2021-05-22: 35 ug/kg/min via INTRAVENOUS
  Administered 2021-05-22 (×2): 50 ug/kg/min via INTRAVENOUS
  Administered 2021-05-23 (×2): 40 ug/kg/min via INTRAVENOUS
  Administered 2021-05-23 (×3): 50 ug/kg/min via INTRAVENOUS
  Administered 2021-05-23: 30 ug/kg/min via INTRAVENOUS
  Administered 2021-05-24 (×8): 50 ug/kg/min via INTRAVENOUS
  Administered 2021-05-25: 30 ug/kg/min via INTRAVENOUS
  Administered 2021-05-25: 40 ug/kg/min via INTRAVENOUS
  Administered 2021-05-25 (×3): 50 ug/kg/min via INTRAVENOUS
  Administered 2021-05-25 – 2021-05-26 (×2): 40 ug/kg/min via INTRAVENOUS
  Administered 2021-05-26: 25 ug/kg/min via INTRAVENOUS
  Administered 2021-05-26 – 2021-05-27 (×6): 40 ug/kg/min via INTRAVENOUS
  Filled 2021-05-17 (×4): qty 100
  Filled 2021-05-17 (×2): qty 200
  Filled 2021-05-17: qty 100
  Filled 2021-05-17: qty 200
  Filled 2021-05-17 (×10): qty 100
  Filled 2021-05-17: qty 200
  Filled 2021-05-17 (×3): qty 100
  Filled 2021-05-17: qty 200
  Filled 2021-05-17 (×4): qty 100
  Filled 2021-05-17 (×4): qty 200
  Filled 2021-05-17: qty 100
  Filled 2021-05-17: qty 200
  Filled 2021-05-17 (×7): qty 100
  Filled 2021-05-17: qty 200
  Filled 2021-05-17: qty 100
  Filled 2021-05-17: qty 200
  Filled 2021-05-17 (×2): qty 100

## 2021-05-17 MED ORDER — FENTANYL CITRATE (PF) 100 MCG/2ML IJ SOLN
50.0000 ug | INTRAMUSCULAR | Status: DC | PRN
  Administered 2021-05-22 (×4): 100 ug via INTRAVENOUS

## 2021-05-17 NOTE — Sepsis Progress Note (Signed)
Lactic acid had to be redrawn causing a delay in result. ?

## 2021-05-17 NOTE — Progress Notes (Signed)
eLink Physician-Brief Progress Note ?Patient Name: [name] ?DOB: 03-Nov-1970 ?MRN: [id number] ? ? ?Date of Service ? 05/17/2021  ?HPI/Events of Note ? Called from [facility] lab. Blood cultures positive for Gram + Cocci. No ID yet. Patient currently on Vancomycin and Aztreonam which should provide adequate Gm + coverage.   ?eICU Interventions ? Plan: ?[facility] notified of findings.  ?Continue present management.   ? ? ? ?Intervention Category ?Major Interventions: Infection - evaluation and management ? ?Sakara Lehtinen Dennard Nip ?05/17/2021, 11:13 PM ?

## 2021-05-17 NOTE — Progress Notes (Addendum)
PCCM Progress Note ? ?Patient has a high A-a gradient, much higher than I would expect based on her CXR. She has Factor V leiden mutation, is at risk for PE.  ? ?Could check CT-PA, but S Cr marginal at 1.59.  ? ?Plan: ?- will change enoxaparin to heparin infusion, consider getting CT-PA once her renal fxn improves ? ?Independent CC time 25 minutes ? ? ?Levy Pupa, MD, PhD ?05/17/2021, 1:55 PM ? Pulmonary and Critical Care ?[phone] or if no answer before 7:00PM call [phone] ?For any issues after 7:00PM please call eLink [phone] ? ?

## 2021-05-17 NOTE — Progress Notes (Signed)
Pharmacy Antibiotic Note ? ?[name] is a 51 y.o. female admitted on 05/16/2021 with sepsis.  Pharmacy has been consulted for Vancomycin/Aztreonam dosing. WBC elevated. Noted renal dysfunction.  ? ?Plan: ?Vancomycin 1500 mg IV x 1, then 750 mg IV q24h ?>>>Estimated AUC: 477 ?Aztreonam 1g IV q8h ?Trend WBC, temp, renal function  ?F/U infectious work-up ?Drug levels as indicated ? ? ?Height: 5\' 3"  (160 cm) ?Weight: 94 kg (207 lb 3.7 oz) ?IBW/kg (Calculated) : 52.4 ? ?Temp (24hrs), Avg:100.2 ?F (37.9 ?C), Min:97.2 ?F (36.2 ?C), Max:100.9 ?F (38.3 ?C) ? ?Recent Labs  ?Lab 05/16/21 ?2130  ?WBC 20.3*  ?CREATININE 1.59*  ?LATICACIDVEN 2.7*  ?  ?Estimated Creatinine Clearance: 46.1 mL/min (A) (by C-G formula based on SCr of 1.59 mg/dL (H)).   ? ?Allergies  ?Allergen Reactions  ? Penicillins Anaphylaxis  ?  Has patient had a PCN reaction causing immediate rash, facial/tongue/throat swelling, SOB or lightheadedness with hypotension: No ?Has patient had a PCN reaction causing severe rash involving mucus membranes or skin necrosis: No ?Has patient had a PCN reaction that required hospitalization No ?Has patient had a PCN reaction occurring within the last 10 years: No ?If all of the above answers are "NO", then may proceed with Cephalosporin use. ?  ? Ibuprofen Nausea And Vomiting  ?  GI upset, Shakes  ? Peppermint Flavor [Flavoring Agent]   ?  Allergic to pepper the spice  ? Aspirin Nausea And Vomiting  ?  Gi upset  ? ? ?Narda Bonds, PharmD, BCPS ?Clinical Pharmacist ?Phone: [phone] ? ?

## 2021-05-17 NOTE — Progress Notes (Addendum)
ANTICOAGULATION CONSULT NOTE - Initial Consult ? ?Pharmacy Consult for Heparin ?Indication: concern of PE ? ?Allergies  ?Allergen Reactions  ? Penicillins Anaphylaxis  ?  Has patient had a PCN reaction causing immediate rash, facial/tongue/throat swelling, SOB or lightheadedness with hypotension: No ?Has patient had a PCN reaction causing severe rash involving mucus membranes or skin necrosis: No ?Has patient had a PCN reaction that required hospitalization No ?Has patient had a PCN reaction occurring within the last 10 years: No ?If all of the above answers are "NO", then may proceed with Cephalosporin use. ?  ? Ibuprofen Nausea And Vomiting  ?  GI upset, Shakes  ? Peppermint Flavor [Flavoring Agent]   ?  Allergic to pepper the spice  ? Aspirin Nausea And Vomiting  ?  Gi upset  ? ? ?Patient Measurements: ?Height: 5\' 3"  (160 cm) ?Weight: 94 kg (207 lb 3.7 oz) ?IBW/kg (Calculated) : 52.4 ?Heparin Dosing Weight: 74.1 kg  ? ?Vital Signs: ?Temp: 99.3 ?F (37.4 ?C) (05/06 1300) ?Temp Source: Bladder (05/06 1000) ?BP: 96/56 (05/06 1300) ?Pulse Rate: 136 (05/06 1300) ? ?Labs: ?Recent Labs  ?  05/16/21 ?2130 05/17/21 ?[id number] 05/17/21 ?0355 05/17/21 ?1337  ?HGB 14.1 15.4* 14.2 15.0  ?HCT 47.4* 47.1* 43.2 44.0  ?PLT 209 226 186  --   ?APTT 34  --   --   --   ?LABPROT 17.8*  --   --   --   ?INR 1.5*  --   --   --   ?CREATININE 1.59*  --  1.59*  --   ?TROPONINIHS  --  64* 55*  --   ? ? ?Estimated Creatinine Clearance: 46.1 mL/min (A) (by C-G formula based on SCr of 1.59 mg/dL (H)). ? ? ?Medical History: ?Past Medical History:  ?Diagnosis Date  ? Anxiety   ? Back pain   ? Chronic back pain 02/04/13  ? By patient report  ? Depression   ? Factor 5 Leiden mutation, heterozygous ([place])   ? Hypertension   ? Laryngitis, chronic   ? Panic attack   ? Scoliosis   ? Ulcer   ? ? ? ?Assessment: ?51 yo female admitted on 05/16/2021. Pharmacy consulted for dose heparin for concern of PE given vent settings in relation to imaging. No d-dimer. No  anticoagulation prior to admission. Hgb 14.2. Plt wnl. No bleeding noted. SQH given at 1315 ? ?Goal of Therapy:  ?Heparin level 0.3-0.7 units/ml ?Monitor platelets by anticoagulation protocol: Yes ?  ?Plan:  ?No bolus due to [facility] just administered ?Start heparin 1250 units/hr  ?Check 6 hr heparin level ?Monitor heparin level, CBC and s/s of bleeding daily  ? ? ?Cristela Felt, PharmD, BCPS ?Clinical Pharmacist ?05/17/2021 1:50 PM ? ? ?

## 2021-05-17 NOTE — Progress Notes (Signed)
eLink Physician-Brief Progress Note ?Patient Name: [name] ?DOB: 1971-01-07 ?MRN: [id number] ? ? ?Date of Service ? 05/17/2021  ?HPI/Events of Note ? Ventilator asynchrony - Patient not pulling volumes and desaturating. Ventilator settings changed to PC by RT. Video assessment of ventilator mechanics look satisfactory.   ?eICU Interventions ? Plan: ?Ventilator settings: 100%/PC 20/Rate 26/P 8. ?Increase ceiling on Fentanyl IV infusion to 400 mcg/hour. Titrate to RASS = 0. ?ABG at 12:30 AM.  ? ? ? ?Intervention Category ?Major Interventions: Respiratory failure - evaluation and management ? ?[name] ?05/17/2021, 11:37 PM ?

## 2021-05-17 NOTE — IPAL (Signed)
?  Interdisciplinary Goals of Care Family Meeting ? ? ?Date carried out:: 05/17/2021 ? ?Location of the meeting: Bedside ? ?Member's involved: Nurse Practitioner, Bedside Registered Nurse, and Family Member or next of kin ? ?Durable Power of Insurance risk surveyor: ?Patient's daughter and son-in-law ? ?Discussion: We discussed goals of care for [name] .  Updated patient's daughter at bedside for daily update and recent clinical signs of decompensation.  She was updated that patient continues to show progressive respiratory decline with signs of severe ARDS.  Currently P/F ratio 79.  Patient's daughter states that she would not want prolonged aggressive interventions and if clinical status changes for the worse she would not want resuscitation. ? ?CODE STATUS changed to DO NOT RESUSCITATE ? ?Code status: Full DNR ? ?Disposition: Continue current acute care ? ? ?Time spent for the meeting: 30 ? ? ?Joan Herschberger D. Harris, NP-C ?[place] [facility] ?Personal contact information can be found on Amion  ?05/17/2021, 2:07 PM ? ? ? ?

## 2021-05-17 NOTE — Progress Notes (Signed)
? ?NAME:  [name], MRN:  [id number], DOB:  09-Oct-1970, LOS: 1 ?ADMISSION DATE:  05/16/2021, CONSULTATION DATE:  5/5 ?REFERRING MD:  Dr. Sabra Heck, CHIEF COMPLAINT:  Acute respiratory failure w/ hypercapnea/hypoxia ? ?History of Present Illness:  ?Patient is a 51 year old female with pertinent PMH of anxiety, depression, chronic pain syndrome on multiple psych meds presents to [facility] on 5/5 with acute respiratory failure. ? ?Patient recently admitted 02/14/2021 with similar episode of respiratory failure with sepsis and aspiration pneumonia and patient was intubated.  Patient left AMA on 02/21/2021.  Patient at home takes multiple psych meds: Xanax, amitriptyline, Lyrica, Trintellix, Ambien, citalopram. ? ?On 5/5, family went to see patient at home and found unresponsive on floor.  EMS called and patient was unresponsive and sats in 47s.  Patient bagged and transported to [facility] ED.  On arrival to [facility] ED patient GCS very low.  Breathing spontaneously but requiring BVM.  Tachycardia 120s with initial BP 76/62.  BS with wheezing, rhonchi, rales.  Patient given multiple doses of Narcan with no improvement.  Patient was intubated.  Initial ABG 7.13, 115, 104, 38.  Settings adjusted now ABG 7.34, 59, 106, 31.  Tmax 100.9 ?F.  CBG 179.  Central line placed.  CXR with ETT in appropriate position; no consolidation.  CT head with no acute abnormality.  Pertinent labs: K5.7, glucose 179, creat 1.59, WBC 20.3.  UA with rare bacteria.  UDS positive for benzos and amphetamines.  Ethanol, acetaminophen, salicylate level WNL.  Ammonia 75.  LA 2.7. cultures pending.  COVID flu negative. ? ?Pertinent  Medical History  ? ?Past Medical History:  ?Diagnosis Date  ? Anxiety   ? Back pain   ? Chronic back pain 02/04/13  ? By patient report  ? Depression   ? Factor 5 Leiden mutation, heterozygous ([place])   ? Hypertension   ? Laryngitis, chronic   ? Panic attack   ? Scoliosis   ? Ulcer   ? ? ?Significant Hospital Events: ?Including procedures,  antibiotic start and stop dates in addition to other pertinent events   ?5/5 admitted to [facility] intubated for resp failure; transferring to [facility]  ?5/6 slight hypoxia this a.m. on vent requiring increase of PEEP from 5-8 and up titration of FiO2 from 70-100 ? ?Interim History / Subjective:  ?Sedated on ventilator ? ?Objective   ?Blood pressure 94/69, pulse (!) 126, temperature 99 ?F (37.2 ?C), resp. rate (!) 26, height 5\' 3"  (1.6 m), weight 94 kg, SpO2 93 %. ?   ?Vent Mode: PRVC ?FiO2 (%):  [70 %-100 %] 70 % ?Set Rate:  [22 bmp-26 bmp] 26 bmp ?Vt Set:  [420 mL] 420 mL ?PEEP:  [5 cmH20] 5 cmH20 ?Plateau Pressure:  [20 cmH20-25 cmH20] 20 cmH20  ? ?Intake/Output Summary (Last 24 hours) at 05/17/2021 0841 ?Last data filed at 05/17/2021 0600 ?Gross per 24 hour  ?Intake 1418.18 ml  ?Output --  ?Net 1418.18 ml  ? ?Filed Weights  ? 05/16/21 2143  ?Weight: 94 kg  ? ? ?Examination: ?General: Acute on chronic obese middle-aged female lying in bed in no acute distress ?HEENT: ETT, MM pink/moist, PERRL,  ?Neuro: Sedated on ventilator ?CV: s1s2 regular rate and rhythm, no murmur, rubs, or gallops,  ?PULM: Clear to auscultation bilaterally, no increased work of breathing, no added breath sounds ?GI: soft, bowel sounds active in all 4 quadrants, non-tender, non-distended ?Extremities: warm/dry, 2+ pitting lower extremity edema  ?Skin: no rashes or lesions ? ?Resolved Hospital Problem list   ? ? ?  Assessment & Plan:  ? ?Acute respiratory failure with hypoxia/hypercapnia ?-Concern for unintentional overdose resulting in hypoxia ?P: ?Continue ventilator support with lung protective strategies  ?Wean PEEP and FiO2 for sats greater than 90%. ?Head of bed elevated 30 degrees. ?Plateau pressures less than 30 cm H20.  ?Follow intermittent chest x-ray and ABG.   ?SAT/SBT as tolerated, mentation preclude extubation  ?Ensure adequate pulmonary hygiene  ?Follow cultures  ?VAP bundle in place  ?PAD protocol ? ?Shock: Likely septic ?-Unknown etiology,  chest x-ray clear urinalysis only with rare bacteria ?-Hypotension possibly secondary to sedation as well ?P: ?Remains critically ill in the ICU ?Follow cultures ?Continue IV Azactam and vancomycin ?Continue pressors for MAP goal > 65 ?Trend lactic acid ?Procalcitonin 0.90 ?Monitor urine output ?Obtain ECHO  ? ?Acute encephalopathy ?-Likely due to hypercarbia and UDS positive for benzos and amphetamines.  CT head negative; ethanol, acetaminophen, salicylate WNL ?Hyperammonemia ?P: ?Maintain neuro protective measures; goal for eurothermia, euglycemia, eunatermia, normoxia, and PCO2 goal of 35-40 ?Nutrition and bowel regiment  ?Seizure precautions  ?AEDs per neurology  ?Aspirations precautions  ?Frequent neurochecks ?Wean sedation as able ? ?Hyperkalemia ?AKI ?P: ?Follow renal function  ?Monitor urine output ?Trend Bmet ?Avoid nephrotoxins ?Ensure adequate renal perfusion  ? ?Mildly elevated LFTs ?Hyperammonemia ?P: ?Intermittently trend LFTs ?Trend ammonia ?Avoid hepatotoxins ?Check hepatitis panel ? ?Hx of depression and anxiety ?Chronic pain syndrome ?P: ?Resume home medications when appropriate ?Delirium precautions ? ?Hx of HTN ?P: ?Hold home antihypertensives in the setting of sepsis/hypotension ? ?GERD with history of gastritis ?P: ?Continue PPI ? ?Hx of Factor V Leyden mutation ?P: ?Continue heparin drip ? ? ?Best Practice (right click and "Reselect all SmartList Selections" daily)  ? ?Diet/type: NPO w/ meds via tube ?DVT prophylaxis: prophylactic heparin  ?GI prophylaxis: PPI ?Lines: Central line ?Foley:  Yes, and it is still needed ?Code Status:  full code ?Last date of multidisciplinary goals of care discussion [5/6 attempted to reach Amber over phone but unable to reach] ? ?Critical care time:   ?CRITICAL CARE ?Performed by: Lindsie Simar D. Harris ? ? ?Total critical care time: 40 minutes ? ?Critical care time was exclusive of separately billable procedures and treating other patients. ? ?Critical care was  necessary to treat or prevent imminent or life-threatening deterioration. ? ?Critical care was time spent personally by me on the following activities: development of treatment plan with patient and/or surrogate as well as nursing, discussions with consultants, evaluation of patient's response to treatment, examination of patient, obtaining history from patient or surrogate, ordering and performing treatments and interventions, ordering and review of laboratory studies, ordering and review of radiographic studies, pulse oximetry and re-evaluation of patient's condition. ? ?Janelys Glassner D. Harris, NP-C ?[place] [facility] ?Personal contact information can be found on Amion  ?05/17/2021, 8:45 AM ? ? ? ?

## 2021-05-17 NOTE — Progress Notes (Signed)
ANTICOAGULATION CONSULT NOTE - Initial Consult ? ?Pharmacy Consult for Heparin ?Indication: concern of PE ? ?Allergies  ?Allergen Reactions  ? Penicillins Anaphylaxis  ?  Has patient had a PCN reaction causing immediate rash, facial/tongue/throat swelling, SOB or lightheadedness with hypotension: No ?Has patient had a PCN reaction causing severe rash involving mucus membranes or skin necrosis: No ?Has patient had a PCN reaction that required hospitalization No ?Has patient had a PCN reaction occurring within the last 10 years: No ?If all of the above answers are "NO", then may proceed with Cephalosporin use. ?  ? Ibuprofen Nausea And Vomiting  ?  GI upset, Shakes  ? Peppermint Flavor [Flavoring Agent]   ?  Allergic to pepper the spice  ? Aspirin Nausea And Vomiting  ?  Gi upset  ? ? ?Patient Measurements: ?Height: 5\' 3"  (160 cm) ?Weight: 94 kg (207 lb 3.7 oz) ?IBW/kg (Calculated) : 52.4 ?Heparin Dosing Weight: 74.1 kg  ? ?Vital Signs: ?Temp: 99.3 ?F (37.4 ?C) (05/06 1915) ?Temp Source: Bladder (05/06 1000) ?BP: 127/109 (05/06 1915) ?Pulse Rate: 102 (05/06 1915) ? ?Labs: ?Recent Labs  ?  05/16/21 ?2130 05/17/21 ?[id number] 05/17/21 ?0355 05/17/21 ?1337 05/17/21 ?2030  ?HGB 14.1 15.4* 14.2 15.0  --   ?HCT 47.4* 47.1* 43.2 44.0  --   ?PLT 209 226 186  --   --   ?APTT 34  --   --   --   --   ?LABPROT 17.8*  --   --   --   --   ?INR 1.5*  --   --   --   --   ?HEPARINUNFRC  --   --   --   --  <0.10*  ?CREATININE 1.59*  --  1.59*  --   --   ?TROPONINIHS  --  64* 55*  --   --   ? ? ? ?Estimated Creatinine Clearance: 46.1 mL/min (A) (by C-G formula based on SCr of 1.59 mg/dL (H)). ? ? ?Medical History: ?Past Medical History:  ?Diagnosis Date  ? Anxiety   ? Back pain   ? Chronic back pain 02/04/13  ? By patient report  ? Depression   ? Factor 5 Leiden mutation, heterozygous ([place])   ? Hypertension   ? Laryngitis, chronic   ? Panic attack   ? Scoliosis   ? Ulcer   ? ? ? ?Assessment: ?51 yo female admitted on 05/16/2021. Pharmacy  consulted for dose heparin for concern of PE given vent settings in relation to imaging. No d-dimer. No anticoagulation prior to admission. Hgb 14.2. Plt wnl.  ? ?Initial heparin level undetectable. No bleeding or infusion issues per RN. ? ?Goal of Therapy:  ?Heparin level 0.3-0.7 units/ml ?Monitor platelets by anticoagulation protocol: Yes ?  ?Plan:  ?Heparin 2500 units x1 then increase to 1550 units/h ?Recheck heparin level in 8h ? ?Arrie Senate, PharmD, BCPS, BCCP ?Clinical Pharmacist ?[phone] ?Please check AMION for all [place] numbers ?05/17/2021 ? ? ? ?

## 2021-05-18 ENCOUNTER — Inpatient Hospital Stay (HOSPITAL_COMMUNITY): Payer: Medicaid Other

## 2021-05-18 DIAGNOSIS — G9340 Encephalopathy, unspecified: Secondary | ICD-10-CM | POA: Diagnosis not present

## 2021-05-18 DIAGNOSIS — N179 Acute kidney failure, unspecified: Secondary | ICD-10-CM | POA: Diagnosis not present

## 2021-05-18 DIAGNOSIS — J80 Acute respiratory distress syndrome: Secondary | ICD-10-CM

## 2021-05-18 DIAGNOSIS — Z66 Do not resuscitate: Secondary | ICD-10-CM | POA: Diagnosis not present

## 2021-05-18 DIAGNOSIS — I5043 Acute on chronic combined systolic (congestive) and diastolic (congestive) heart failure: Secondary | ICD-10-CM | POA: Diagnosis not present

## 2021-05-18 DIAGNOSIS — R6521 Severe sepsis with septic shock: Secondary | ICD-10-CM | POA: Diagnosis not present

## 2021-05-18 DIAGNOSIS — A419 Sepsis, unspecified organism: Secondary | ICD-10-CM | POA: Diagnosis not present

## 2021-05-18 DIAGNOSIS — E7220 Disorder of urea cycle metabolism, unspecified: Secondary | ICD-10-CM | POA: Diagnosis not present

## 2021-05-18 DIAGNOSIS — J154 Pneumonia due to other streptococci: Secondary | ICD-10-CM | POA: Diagnosis not present

## 2021-05-18 DIAGNOSIS — Z1152 Encounter for screening for COVID-19: Secondary | ICD-10-CM | POA: Diagnosis not present

## 2021-05-18 DIAGNOSIS — G928 Other toxic encephalopathy: Secondary | ICD-10-CM | POA: Diagnosis not present

## 2021-05-18 DIAGNOSIS — J690 Pneumonitis due to inhalation of food and vomit: Secondary | ICD-10-CM | POA: Diagnosis not present

## 2021-05-18 DIAGNOSIS — A403 Sepsis due to Streptococcus pneumoniae: Secondary | ICD-10-CM | POA: Diagnosis not present

## 2021-05-18 DIAGNOSIS — J15211 Pneumonia due to Methicillin susceptible Staphylococcus aureus: Secondary | ICD-10-CM | POA: Diagnosis not present

## 2021-05-18 DIAGNOSIS — E875 Hyperkalemia: Secondary | ICD-10-CM | POA: Diagnosis not present

## 2021-05-18 DIAGNOSIS — J9602 Acute respiratory failure with hypercapnia: Secondary | ICD-10-CM | POA: Diagnosis not present

## 2021-05-18 DIAGNOSIS — R0609 Other forms of dyspnea: Secondary | ICD-10-CM | POA: Diagnosis not present

## 2021-05-18 DIAGNOSIS — K7200 Acute and subacute hepatic failure without coma: Secondary | ICD-10-CM | POA: Diagnosis not present

## 2021-05-18 DIAGNOSIS — R570 Cardiogenic shock: Secondary | ICD-10-CM | POA: Diagnosis not present

## 2021-05-18 LAB — BLOOD CULTURE ID PANEL (REFLEXED) - BCID2

## 2021-05-18 LAB — POCT I-STAT 7, (LYTES, BLD GAS, ICA,H+H)
Acid-Base Excess: 2 mmol/L (ref 0.0–2.0)
Acid-Base Excess: 2 mmol/L (ref 0.0–2.0)
Acid-Base Excess: 3 mmol/L — ABNORMAL HIGH (ref 0.0–2.0)
Acid-Base Excess: 9 mmol/L — ABNORMAL HIGH (ref 0.0–2.0)
Bicarbonate: 29.9 mmol/L — ABNORMAL HIGH (ref 20.0–28.0)
Bicarbonate: 31.2 mmol/L — ABNORMAL HIGH (ref 20.0–28.0)
Bicarbonate: 31.5 mmol/L — ABNORMAL HIGH (ref 20.0–28.0)
Bicarbonate: 32.3 mmol/L — ABNORMAL HIGH (ref 20.0–28.0)
Calcium, Ion: 1.18 mmol/L (ref 1.15–1.40)
Calcium, Ion: 1.19 mmol/L (ref 1.15–1.40)
Calcium, Ion: 1.21 mmol/L (ref 1.15–1.40)
Calcium, Ion: 1.22 mmol/L (ref 1.15–1.40)
HCT: 47 % — ABNORMAL HIGH (ref 36.0–46.0)
HCT: 49 % — ABNORMAL HIGH (ref 36.0–46.0)
HCT: 50 % — ABNORMAL HIGH (ref 36.0–46.0)
HCT: 52 % — ABNORMAL HIGH (ref 36.0–46.0)
Hemoglobin: 16 g/dL — ABNORMAL HIGH (ref 12.0–15.0)
Hemoglobin: 16.7 g/dL — ABNORMAL HIGH (ref 12.0–15.0)
Hemoglobin: 17 g/dL — ABNORMAL HIGH (ref 12.0–15.0)
Hemoglobin: 17.7 g/dL — ABNORMAL HIGH (ref 12.0–15.0)
O2 Saturation: 89 %
O2 Saturation: 94 %
O2 Saturation: 96 %
O2 Saturation: 97 %
Patient temperature: 37.8
Patient temperature: 97.9
Patient temperature: 97.9
Patient temperature: 99.1
Potassium: 4.4 mmol/L (ref 3.5–5.1)
Potassium: 4.4 mmol/L (ref 3.5–5.1)
Potassium: 4.5 mmol/L (ref 3.5–5.1)
Potassium: 4.5 mmol/L (ref 3.5–5.1)
Sodium: 136 mmol/L (ref 135–145)
Sodium: 136 mmol/L (ref 135–145)
Sodium: 136 mmol/L (ref 135–145)
Sodium: 136 mmol/L (ref 135–145)
TCO2: 31 mmol/L (ref 22–32)
TCO2: 33 mmol/L — ABNORMAL HIGH (ref 22–32)
TCO2: 33 mmol/L — ABNORMAL HIGH (ref 22–32)
TCO2: 34 mmol/L — ABNORMAL HIGH (ref 22–32)
pCO2 arterial: 39.7 mmHg (ref 32–48)
pCO2 arterial: 51.2 mmHg — ABNORMAL HIGH (ref 32–48)
pCO2 arterial: 65.7 mmHg (ref 32–48)
pCO2 arterial: 67.4 mmHg (ref 32–48)
pH, Arterial: 7.275 — ABNORMAL LOW (ref 7.35–7.45)
pH, Arterial: 7.283 — ABNORMAL LOW (ref 7.35–7.45)
pH, Arterial: 7.376 (ref 7.35–7.45)
pH, Arterial: 7.521 — ABNORMAL HIGH (ref 7.35–7.45)
pO2, Arterial: 65 mmHg — ABNORMAL LOW (ref 83–108)
pO2, Arterial: 80 mmHg — ABNORMAL LOW (ref 83–108)
pO2, Arterial: 83 mmHg (ref 83–108)
pO2, Arterial: 89 mmHg (ref 83–108)

## 2021-05-18 LAB — GLUCOSE, CAPILLARY
Glucose-Capillary: 147 mg/dL — ABNORMAL HIGH (ref 70–99)
Glucose-Capillary: 168 mg/dL — ABNORMAL HIGH (ref 70–99)
Glucose-Capillary: 151 mg/dL — ABNORMAL HIGH (ref 70–99)
Glucose-Capillary: 136 mg/dL — ABNORMAL HIGH (ref 70–99)
Glucose-Capillary: 138 mg/dL — ABNORMAL HIGH (ref 70–99)
Glucose-Capillary: 139 mg/dL — ABNORMAL HIGH (ref 70–99)

## 2021-05-18 LAB — BASIC METABOLIC PANEL
Anion gap: 7 (ref 5–15)
BUN: 18 mg/dL (ref 6–20)
CO2: 29 mmol/L (ref 22–32)
Calcium: 7.9 mg/dL — ABNORMAL LOW (ref 8.9–10.3)
Chloride: 103 mmol/L (ref 98–111)
Creatinine, Ser: 1.43 mg/dL — ABNORMAL HIGH (ref 0.44–1.00)
GFR, Estimated: 45 mL/min — ABNORMAL LOW (ref >60)
Glucose, Bld: 138 mg/dL — ABNORMAL HIGH (ref 70–99)
Potassium: 3.9 mmol/L (ref 3.5–5.1)
Sodium: 139 mmol/L (ref 135–145)

## 2021-05-18 LAB — MAGNESIUM: Magnesium: 1.8 mg/dL (ref 1.7–2.4)

## 2021-05-18 LAB — ECHOCARDIOGRAM COMPLETE
Area-P 1/2: 6.43 cm2
Height: 63 in
S' Lateral: 5 cm
Weight: 3315.72 oz

## 2021-05-18 LAB — URINE CULTURE: Culture: NO GROWTH

## 2021-05-18 LAB — COOXEMETRY PANEL
Carboxyhemoglobin: 1.7 % — ABNORMAL HIGH (ref 0.5–1.5)
Methemoglobin: 0.7 % (ref 0.0–1.5)
O2 Saturation: 97.1 %
Total hemoglobin: 16 g/dL (ref 12.0–16.0)

## 2021-05-18 LAB — AMMONIA: Ammonia: 18 umol/L (ref 9–35)

## 2021-05-18 LAB — HEPARIN LEVEL (UNFRACTIONATED)
Heparin Unfractionated: 0.3 IU/mL (ref 0.30–0.70)
Heparin Unfractionated: 0.46 IU/mL (ref 0.30–0.70)

## 2021-05-18 LAB — CBC
HCT: 45.6 % (ref 36.0–46.0)
Hemoglobin: 14.9 g/dL (ref 12.0–15.0)
MCH: 32.7 pg (ref 26.0–34.0)
MCHC: 32.7 g/dL (ref 30.0–36.0)
MCV: 100.2 fL — ABNORMAL HIGH (ref 80.0–100.0)
Platelets: 169 10*3/uL (ref 150–400)
RBC: 4.55 MIL/uL (ref 3.87–5.11)
RDW: 15.4 % (ref 11.5–15.5)
WBC: 13.3 10*3/uL — ABNORMAL HIGH (ref 4.0–10.5)
nRBC: 0 % (ref 0.0–0.2)

## 2021-05-18 LAB — TROPONIN I (HIGH SENSITIVITY)
Troponin I (High Sensitivity): 19 ng/L — ABNORMAL HIGH (ref <18)
Troponin I (High Sensitivity): 21 ng/L — ABNORMAL HIGH (ref <18)

## 2021-05-18 LAB — TRIGLYCERIDES: Triglycerides: 130 mg/dL (ref <150)

## 2021-05-18 MED ORDER — MIDAZOLAM HCL 2 MG/2ML IJ SOLN
INTRAMUSCULAR | Status: AC
  Administered 2021-05-18: 2 mg via INTRAVENOUS
  Filled 2021-05-18: qty 2

## 2021-05-18 MED ORDER — AMIODARONE IV BOLUS ONLY 150 MG/100ML
INTRAVENOUS | Status: AC
  Filled 2021-05-18: qty 100

## 2021-05-18 MED ORDER — VECURONIUM BOLUS VIA INFUSION
0.0800 mg/kg | Freq: Once | INTRAVENOUS | Status: AC
  Administered 2021-05-18: 7.5 mg via INTRAVENOUS
  Filled 2021-05-18: qty 8

## 2021-05-18 MED ORDER — FUROSEMIDE 10 MG/ML IJ SOLN
60.0000 mg | Freq: Once | INTRAMUSCULAR | Status: AC
  Administered 2021-05-18: 60 mg via INTRAVENOUS
  Filled 2021-05-18: qty 6

## 2021-05-18 MED ORDER — MIDAZOLAM HCL 2 MG/2ML IJ SOLN
2.0000 mg | INTRAMUSCULAR | Status: AC | PRN
  Administered 2021-05-19 – 2021-05-22 (×3): 2 mg via INTRAVENOUS
  Filled 2021-05-18 (×4): qty 2

## 2021-05-18 MED ORDER — VECURONIUM BROMIDE 10 MG IV SOLR
INTRAVENOUS | Status: AC
  Administered 2021-05-18: 10 mg via INTRAVENOUS
  Filled 2021-05-18: qty 10

## 2021-05-18 MED ORDER — VECURONIUM BROMIDE 10 MG IV SOLR
10.0000 mg | INTRAVENOUS | Status: DC | PRN
  Administered 2021-05-18 – 2021-05-23 (×5): 10 mg via INTRAVENOUS
  Filled 2021-05-18 (×4): qty 10

## 2021-05-18 MED ORDER — AMIODARONE HCL IN DEXTROSE 360-4.14 MG/200ML-% IV SOLN
30.0000 mg/h | INTRAVENOUS | Status: DC
  Filled 2021-05-18: qty 200

## 2021-05-18 MED ORDER — ARTIFICIAL TEARS OPHTHALMIC OINT
1.0000 "application " | TOPICAL_OINTMENT | Freq: Three times a day (TID) | OPHTHALMIC | Status: DC
  Administered 2021-05-18 – 2021-05-20 (×5): 1 via OPHTHALMIC
  Filled 2021-05-18: qty 3.5

## 2021-05-18 MED ORDER — VECURONIUM BROMIDE 10 MG IV SOLR: 10.0000 mg | Freq: Once | INTRAVENOUS | Status: AC

## 2021-05-18 MED ORDER — CISATRACURIUM BESYLATE 20 MG/10ML IV SOLN: 0.1000 mg/kg | INTRAVENOUS | Status: DC | PRN

## 2021-05-18 MED ORDER — AMIODARONE HCL IN DEXTROSE 360-4.14 MG/200ML-% IV SOLN
60.0000 mg/h | INTRAVENOUS | Status: AC
  Administered 2021-05-18 (×2): 60 mg/h via INTRAVENOUS

## 2021-05-18 MED ORDER — MAGNESIUM SULFATE IN D5W 1-5 GM/100ML-% IV SOLN
1.0000 g | Freq: Once | INTRAVENOUS | Status: AC
  Administered 2021-05-18: 1 g via INTRAVENOUS
  Filled 2021-05-18: qty 100

## 2021-05-18 MED ORDER — PERFLUTREN LIPID MICROSPHERE
1.0000 mL | INTRAVENOUS | Status: AC | PRN
  Administered 2021-05-18: 2 mL via INTRAVENOUS
  Filled 2021-05-18: qty 10

## 2021-05-18 MED ORDER — AMIODARONE HCL IN DEXTROSE 360-4.14 MG/200ML-% IV SOLN
INTRAVENOUS | Status: AC
  Administered 2021-05-18: 150 mg via INTRAVENOUS
  Filled 2021-05-18: qty 200

## 2021-05-18 MED ORDER — AMIODARONE LOAD VIA INFUSION
150.0000 mg | Freq: Once | INTRAVENOUS | Status: AC
Start: 2021-05-18 — End: 2021-05-18
  Filled 2021-05-18: qty 83.34

## 2021-05-18 MED ORDER — POTASSIUM CHLORIDE 20 MEQ PO PACK
20.0000 meq | PACK | Freq: Once | ORAL | Status: AC
  Administered 2021-05-18: 20 meq
  Filled 2021-05-18: qty 1

## 2021-05-18 MED ORDER — VECURONIUM BROMIDE 10 MG IV SOLR
0.0000 ug/kg/min | Status: DC
  Administered 2021-05-18: 1 ug/kg/min via INTRAVENOUS
  Administered 2021-05-19: 0.7 ug/kg/min via INTRAVENOUS
  Filled 2021-05-18 (×2): qty 100

## 2021-05-18 MED ORDER — FUROSEMIDE 10 MG/ML IJ SOLN
40.0000 mg | Freq: Once | INTRAMUSCULAR | Status: AC
  Administered 2021-05-18: 40 mg via INTRAVENOUS
  Filled 2021-05-18: qty 4

## 2021-05-18 MED ORDER — MIDAZOLAM HCL 2 MG/2ML IJ SOLN
2.0000 mg | INTRAMUSCULAR | Status: DC | PRN
  Administered 2021-05-22: 2 mg via INTRAVENOUS
  Filled 2021-05-18: qty 2

## 2021-05-18 NOTE — Progress Notes (Signed)
PCCM progress note ? ?Throughout the afternoon patient has continued to decompensate.  As needed paralytic had to be pushed due to vent dyssynchrony in the setting of evolving ARDS.  ? ?Despite PRN paralytic and max FIO2 patient continues to remains severely hypoxic. Therefore, full ARDS protocol was imitated and PEEP was increased to 16 from 8 and FIO2 remains at 100%. We will also start a continuous NMB to ensure vent compliance.Repeat ABG one hr after vent changes.  ? ?Optimization of likely underlying cardiogenic shock needs to occur as well but currently patient is too unstable to initiate lasix drip. COOX drew earlier today unreliable as central line is femorally inserted.  ? ?Family was made aware of decompensation and critical status. All questions were answered. If patient continues to decline or quality of life get further compromised family would wish to porceed with comfort care.  ? ?Danielle Weins D. Harris, NP-C ?[place] [facility] ?Personal contact information can be found on Amion  ?05/18/2021, 5:25 PM ? ? ?

## 2021-05-18 NOTE — Progress Notes (Signed)
eLink Physician-Brief Progress Note ?Patient Name: CARALINA NOP ?DOB: 1971-01-03 ?MRN: [id number] ? ? ?Date of Service ? 05/18/2021  ?HPI/Events of Note ? AFlutter - K+ = 3.9, Mg++ = 1.8 and Creatinine = 1.43. Goal K+ > 4.0 and Mg++ > 2.0.  ?eICU Interventions ? Will replace K+ and Mg++.  ? ? ? ?Intervention Category ?Major Interventions: Arrhythmia - evaluation and management;Electrolyte abnormality - evaluation and management ? ?Morayo Leven Dennard Nip ?05/18/2021, 6:17 AM ?

## 2021-05-18 NOTE — Progress Notes (Signed)
eLink Physician-Brief Progress Note ?Patient Name: [name] ?DOB: 1970/02/17 ?MRN: [id number] ? ? ?Date of Service ? 05/18/2021  ?HPI/Events of Note ? Review of CXR reveals: ?1. Stable cardiomegaly, perihilar vascular congestion and slight ?central interstitial edema. ?2. Right-greater-than-left pleural effusions and overlying lung ?opacities consistent with atelectasis or consolidation. No new or ?worsened abnormality. ?BP = 93/76 with MAP = 82.  ?eICU Interventions ? Plan: ?Lasix 40 mg IV X 1 now.   ? ? ? ?Intervention Category ?Major Interventions: Hypoxemia - evaluation and management ? ?Deziah Renwick Cornelia Copa ?05/18/2021, 6:11 AM ?

## 2021-05-18 NOTE — Progress Notes (Signed)
eLink Physician-Brief Progress Note ?Patient Name: [name] ?DOB: [birth year] ?MRN: [id number] ? ? ?Date of Service ? 05/18/2021  ?HPI/Events of Note ? Nursing reports desaturation down to 86% associated with pink sputum and wheezing. Progession of pneumonia vs pulmonary congestion. 2. HR = 131 appears to be sinus tachycardia on bedside monitor.   ?eICU Interventions ? Plan: ?Portable CXR STAT. ?EKG STAT. >> Atrial flutter with 2:1 A-V conduction. Rightward axis.  ?Low  voltage QRS. Cannot rule out Anterior infarct , age undetermined ?Cycle Toponin. ?Amiodarone IV load and IV infusion.  ?Send AM labs including BMP now.   ? ? ? ?Intervention Category ?Major Interventions: Respiratory failure - evaluation and management ? ?[name] Dennard Nip ?05/18/2021, 3:34 AM ?

## 2021-05-18 NOTE — Progress Notes (Addendum)
ANTICOAGULATION CONSULT NOTE - Follow Up Consult ? ?Pharmacy Consult for Heparin ?Indication: concern of PE ? ?Allergies  ?Allergen Reactions  ? Penicillins Anaphylaxis  ?  Has patient had a PCN reaction causing immediate rash, facial/tongue/throat swelling, SOB or lightheadedness with hypotension: No ?Has patient had a PCN reaction causing severe rash involving mucus membranes or skin necrosis: No ?Has patient had a PCN reaction that required hospitalization No ?Has patient had a PCN reaction occurring within the last 10 years: No ?If all of the above answers are "NO", then may proceed with Cephalosporin use. ?  ? Ibuprofen Nausea And Vomiting  ?  GI upset, Shakes  ? Peppermint Flavor [Flavoring Agent]   ?  Allergic to pepper the spice  ? Aspirin Nausea And Vomiting  ?  Gi upset  ? ? ?Patient Measurements: ?Height: 5\' 3"  (160 cm) ?Weight: 94 kg (207 lb 3.7 oz) ?IBW/kg (Calculated) : 52.4 ?Heparin Dosing Weight: 74.1 kg  ? ?Vital Signs: ?Temp: 100.7 ?F (38.2 ?C) (05/07 12-03-1985) ?Temp Source: Bladder (05/07 12-03-1985) ?BP: 103/87 (05/07 0715) ?Pulse Rate: 151 (05/07 0715) ? ?Labs: ?Recent Labs  ?  05/16/21 ?2130 05/16/21 ?2130 05/17/21 ?07/17/21 05/17/21 ?0355 05/17/21 ?1337 05/17/21 ?2030 05/18/21 ?0050 05/18/21 ?07/18/21 05/18/21 ?07/18/21  ?HGB 14.1  --  15.4* 14.2 15.0  --  16.0* 14.9  --   ?HCT 47.4*  --  47.1* 43.2 44.0  --  47.0* 45.6  --   ?PLT 209  --  226 186  --   --   --  169  --   ?APTT 34  --   --   --   --   --   --   --   --   ?LABPROT 17.8*  --   --   --   --   --   --   --   --   ?INR 1.5*  --   --   --   --   --   --   --   --   ?HEPARINUNFRC  --   --   --   --   --  <0.10*  --   --  0.30  ?CREATININE 1.59*  --   --  1.59*  --   --   --  1.43*  --   ?TROPONINIHS  --    < > 64* 55*  --   --   --  19* 21*  ? < > = values in this interval not displayed.  ? ? ? ?Estimated Creatinine Clearance: 51.3 mL/min (A) (by C-G formula based on SCr of 1.43 mg/dL (H)). ? ? ?Medical History: ?Past Medical History:  ?Diagnosis Date  ?  Anxiety   ? Back pain   ? Chronic back pain 02/04/13  ? By patient report  ? Depression   ? Factor 5 Leiden mutation, heterozygous ([facility])   ? Hypertension   ? Laryngitis, chronic   ? Panic attack   ? Scoliosis   ? Ulcer   ? ? ? ?Assessment: ?51 yo female admitted on 05/16/2021. Pharmacy consulted for dose heparin for concern of PE given vent settings in relation to imaging. No d-dimer. No anticoagulation prior to admission. SQH last given at 1315 on 5/6.  ? ?Heparin level of 0.30 is therapeutic on heparin 1550 units/hr though at lower end of therapeutic range (0.3-0.7 units/ml). Hgb 14.9. Plt 169. Pink sputum noted overnight, suctioned by RT and RN this AM and very light pink secretion. No other  sights of bleeding. No issues IV access or infusion ? ?Goal of Therapy:  ?Heparin level 0.3-0.7 units/ml ?Monitor platelets by anticoagulation protocol: Yes ?  ?Plan:  ?Increase heparin to 1600 units/hr to ensure remains in range  ?Check 6 hr heparin level ?Monitor heparin level, CBC and s/s of bleeding daily  ? ? ?Gerrit Halls, PharmD, BCPS ?Clinical Pharmacist ?05/18/2021 7:44 AM ? ?Addendum: heparin level of 0.46 is therapeutic on heparin 1600 units/hr. Secretions more tan this afternoon per RT. No other signs of bleeding  ? ?Plan: ?Continue heparin 1600 units/hr  ?Follow up AM heparin level  ? ? ?Gerrit Halls, PharmD, BCPS ?Clinical Pharmacist ?05/18/2021 3:02 PM ? ? ? ?

## 2021-05-18 NOTE — Procedures (Signed)
Arterial Catheter Insertion Procedure Note ? ?[name]  ?[id number]  ?1970/06/08 ? ?Date:05/18/21  ?Time:12:27 PM  ? ? ?Provider Performing: Kalin Amrhein D. Harris  ? ? ?Procedure: Insertion of Arterial Line (99833) with US guidance (82505)  ? ?Indication(s) ?Blood pressure monitoring and/or need for frequent ABGs ? ?Consent ?Unable to obtain consent due to emergent nature of procedure. ? ?Anesthesia ?None ? ? ?Time Out ?Verified patient identification, verified procedure, site/side was marked, verified correct patient position, special equipment/implants available, medications/allergies/relevant history reviewed, required imaging and test results available. ? ? ?Sterile Technique ?Maximal sterile technique including full sterile barrier drape, hand hygiene, sterile gown, sterile gloves, mask, hair covering, sterile ultrasound probe cover (if used). ? ? ?Procedure Description ?Area of catheter insertion was cleaned with chlorhexidine and draped in sterile fashion. With real-time ultrasound guidance an arterial catheter was placed into the right radial artery.  Appropriate arterial tracings confirmed on monitor.   ? ? ?Complications/Tolerance ?None; patient tolerated the procedure well. ? ? ?EBL ?Minimal ? ? ?Specimen(s) ?None ? ?[name] D. Harris, NP-C ?[place] [facility] [facility] ?Personal contact information can be found on Amion  ?05/18/2021, 12:27 PM ? ? ?

## 2021-05-18 NOTE — Progress Notes (Signed)
Echocardiogram ?2D Echocardiogram has been performed. ? ?[name] Nickalus Thornsberry RDCS ?05/18/2021, 10:25 AM ?

## 2021-05-18 NOTE — Progress Notes (Addendum)
? ?NAME:  [name], MRN:  [id number], DOB:  March 30, 1970, LOS: 2 ?ADMISSION DATE:  05/16/2021, CONSULTATION DATE:  5/5 ?REFERRING MD:  Dr. Sabra Heck, CHIEF COMPLAINT:  Acute respiratory failure w/ hypercapnea/hypoxia ? ?History of Present Illness:  ?Patient is a 51 year old female with pertinent PMH of anxiety, depression, chronic pain syndrome on multiple psych meds presents to [facility] on 5/5 with acute respiratory failure. ? ?Patient recently admitted 02/14/2021 with similar episode of respiratory failure with sepsis and aspiration pneumonia and patient was intubated.  Patient left AMA on 02/21/2021.  Patient at home takes multiple psych meds: Xanax, amitriptyline, Lyrica, Trintellix, Ambien, citalopram. ? ?On 5/5, family went to see patient at home and found unresponsive on floor.  EMS called and patient was unresponsive and sats in 76s.  Patient bagged and transported to [facility] ED.  On arrival to [facility] ED patient GCS very low.  Breathing spontaneously but requiring BVM.  Tachycardia 120s with initial BP 76/62.  BS with wheezing, rhonchi, rales.  Patient given multiple doses of Narcan with no improvement.  Patient was intubated.  Initial ABG 7.13, 115, 104, 38.  Settings adjusted now ABG 7.34, 59, 106, 31.  Tmax 100.9 ?F.  CBG 179.  Central line placed.  CXR with ETT in appropriate position; no consolidation.  CT head with no acute abnormality.  Pertinent labs: K5.7, glucose 179, creat 1.59, WBC 20.3.  UA with rare bacteria.  UDS positive for benzos and amphetamines.  Ethanol, acetaminophen, salicylate level WNL.  Ammonia 75.  LA 2.7. cultures pending.  COVID flu negative. ? ?Pertinent  Medical History  ? ?Past Medical History:  ?Diagnosis Date  ? Anxiety   ? Back pain   ? Chronic back pain 02/04/13  ? By patient report  ? Depression   ? Factor 5 Leiden mutation, heterozygous ([place])   ? Hypertension   ? Laryngitis, chronic   ? Panic attack   ? Scoliosis   ? Ulcer   ? ? ?Significant Hospital Events: ?Including procedures,  antibiotic start and stop dates in addition to other pertinent events   ?5/5 admitted to [facility] intubated for resp failure; transferring to [facility]  ?5/6 slight hypoxia this a.m. on vent requiring increase of PEEP from 5-8 and up titration of FiO2 from 70-100 ?5/7 notified of positive blood cultures for night, antibiotic regiment remains seen with broad coverage.  She is more alert and oriented this a.m. ? ?Interim History / Subjective:  ?Able to follow simple commands on ventilator this a.m. ? ?Objective   ?Blood pressure 108/88, pulse (!) 116, temperature (!) 100.7 ?F (38.2 ?C), temperature source Bladder, resp. rate (!) 26, height 5' 3"  (1.6 m), weight 94 kg, SpO2 97 %. ?   ?Vent Mode: PCV ?FiO2 (%):  [70 %-100 %] 100 % ?Set Rate:  [26 bmp] 26 bmp ?Vt Set:  [320 mL-420 mL] 320 mL ?PEEP:  [8 cmH20-10 cmH20] 10 cmH20 ?Plateau Pressure:  [16 cmH20-27 cmH20] 27 cmH20  ? ?Intake/Output Summary (Last 24 hours) at 05/18/2021 0904 ?Last data filed at 05/18/2021 0600 ?Gross per 24 hour  ?Intake 2448.29 ml  ?Output 1485 ml  ?Net 963.29 ml  ? ? ?Filed Weights  ? 05/16/21 2143 05/17/21 1300  ?Weight: 94 kg 94 kg  ? ? ?Examination: ?General: Acute on chronic ill-appearing middle-aged female lying in bed on mechanical ventilation in no acute distress ?HEENT: ETT, MM pink/moist, PERRL,  ?Neuro: Alert and able to follow commands on vent this morning ?CV: s1s2 regular rate and rhythm,  no murmur, rubs, or gallops,  ?PULM: Slightly diminished, increased work of breathing, no added breath sounds ?GI: soft, bowel sounds active in all 4 quadrants, non-tender, non-distended ?Extremities: warm/dry, no edema  ?Skin: no rashes or lesions ? ?Resolved Hospital Problem list   ? ? ?Assessment & Plan:  ? ?Acute respiratory failure with hypoxia/hypercapnia with evolving ARDS ?-Concern for unintentional overdose resulting in hypoxia ?-Continues to met criteria for sever ARDS with P/F ratio of 89 (mortality of 45%) ?P: ?Need to keep vent on PRVC setting to  allow for protective ventilation of patient is not compliant with setting need to focus on sedation  ?Goal of 6cc/kg ?Wean PEEP and FiO2 for sats greater than 90%. ?Head of bed elevated 30 degrees. ?Plateau pressures less than 30 cm H20.  ?Follow intermittent chest x-ray and ABG.   ?SAT/SBT as tolerated, mentation preclude extubation  ?Ensure adequate pulmonary hygiene  ?Follow cultures  ?VAP bundle in place  ?PAD protocol ? ?Shock in the setting of gram-positive bacteremia with possible mixed cardiogenic shock  ?-Unknown etiology, chest x-ray clear urinalysis only with rare bacteria ?-Hypotension possibly secondary to sedation as well ?P: ?Remains critically ill in the ICU ?Continue to follow cultures ?Continue IV Azactam and vancomycin due reported penicillin allergy ?Remains on low-dose pressors for map goal greater than 65 ?Follow-up on echo ? 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 Became tachycardic resulting in addition of Amiodarone drip overnight ?Hx of HTN ?P: ?HF consult  ?Strict intake and output  ?Daily weight to assess volume status ?Diureses  ?Closely monitor renal function and electrolytes  ?Vent support as above  ?Ensure hemodynamic control ?Remains on IV amio, hopeful to stop this soon  ?Continuous telemetry  ?Heparin drip as below  ?Resume home medications when appropriate ? ?Acute encephalopathy ?-Likely due to hypercarbia and UDS positive for benzos and amphetamines.  CT head negative; ethanol, acetaminophen, salicylate WNL ?Hyperammonemia ?P: ?Maintain neuro protective measures; goal for eurothermia, euglycemia, eunatermia, normoxia, and PCO2 goal of 35-40 ?Nutrition and bowel regiment  ?Seizure precautions   ?Aspirations precautions  ?Frequent neurochecks ?Minimize sedation as  able ? ?Hyperkalemia ?AKI ?P: ?Follow renal function  ?Monitor urine output ?Trend Bmet ?Avoid nephrotoxins ?Ensure adequate renal perfusion  ? ?Mildly elevated LFTs ?Hyperammonemia ?-Hepatitis panel negative ?P: ?Intermittently trend LFTs ?Trend ammonia ?Avoid hepatotoxins ? ?Hx of depression and anxiety ?Chronic pain syndrome ?P: ?Resume home medication when appropriate ?Delirium precautions ? ?GERD with history of gastritis ?P: ?Continue PPI I ? ?Hx of Factor V Leyden mutation ?P: ?Heparin drip initiated 5/6 empiric coverage of possible PE ? ?Best Practice (right click and "Reselect all SmartList Selections" daily)  ? ?Diet/type: NPO w/ meds via tube ?DVT prophylaxis: prophylactic heparin  ?GI prophylaxis: PPI ?Lines: Central line ?Foley:  Yes, and it is still needed ?Code Status:  full code ?Last date of multidisciplinary goals of care discussion see my family dictated 5/6 ? ?Critical care time:   ?CRITICAL CARE ?Performed by: Ritika Hellickson D. Harris ? ?Total critical care time: 38 minutes ? ?Critical care time was exclusive of separately billable procedures and treating other patients. ? ?Critical care was necessary to treat or prevent imminent or life-threatening deterioration. ? ?Critical care was time spent personally by me on the following activities: development of treatment plan with patient and/or surrogate as  well as nursing, discussions with consultants, evaluation of patient's response to treatment, examination of patient, obtaining history from patient or surrogate, ordering and performing treatments and interventions, ordering and review of laboratory studies, ordering and review of radiographic studies, pulse oximetry and re-evaluation of patient's condition. ? ?Jacody Beneke D. Harris, NP-C ?[facility] ?Personal contact information can be found on Amion  ?05/18/2021, 9:04 AM ? ? ? ?

## 2021-05-18 NOTE — Progress Notes (Signed)
eLink Physician-Brief Progress Note ?Patient Name: [name] ?DOB: [birth year] ?MRN: [id number] ? ? ?Date of Service ? 05/18/2021  ?HPI/Events of Note ? Fever to 101.3 F - Nursing request for Tylenol. AST = 46. Will avoid Tylenol.   ?eICU Interventions ? Plan: ?Ice packs PRN. ?Cooling blanket PRN.   ? ? ? ?Intervention Category ?Major Interventions: Other: ? ?Anamari Galeas Dennard Nip ?05/18/2021, 2:46 AM ?

## 2021-05-18 NOTE — Progress Notes (Signed)
PHARMACY - PHYSICIAN COMMUNICATION ?CRITICAL VALUE ALERT - BLOOD CULTURE IDENTIFICATION (BCID) ? ?[name] is an 51 y.o. female who presented to [facility] on 05/16/2021 with a chief complaint of respiratory failure ? ? ?Name of physician (or Provider) Contacted: Dr. Arsenio Loader ? ?Current antibiotics: Vancomycin/Aztreonam ? ?Changes to prescribed antibiotics recommended:  ?No changes ? ?Results for orders placed or performed during the hospital encounter of 05/16/21  ?Blood Culture ID Panel (Reflexed) (Collected: 05/16/2021  9:30 PM)  ?Result Value Ref Range  ? Enterococcus faecalis NOT DETECTED NOT DETECTED  ? Enterococcus Faecium NOT DETECTED NOT DETECTED  ? Listeria monocytogenes NOT DETECTED NOT DETECTED  ? Staphylococcus species DETECTED (A) NOT DETECTED  ? Staphylococcus aureus (BCID) NOT DETECTED NOT DETECTED  ? Staphylococcus epidermidis NOT DETECTED NOT DETECTED  ? Staphylococcus lugdunensis NOT DETECTED NOT DETECTED  ? Streptococcus species NOT DETECTED NOT DETECTED  ? Streptococcus agalactiae NOT DETECTED NOT DETECTED  ? Streptococcus pneumoniae NOT DETECTED NOT DETECTED  ? Streptococcus pyogenes NOT DETECTED NOT DETECTED  ? A.calcoaceticus-baumannii NOT DETECTED NOT DETECTED  ? Bacteroides fragilis NOT DETECTED NOT DETECTED  ? Enterobacterales NOT DETECTED NOT DETECTED  ? Enterobacter cloacae complex NOT DETECTED NOT DETECTED  ? Escherichia coli NOT DETECTED NOT DETECTED  ? Klebsiella aerogenes NOT DETECTED NOT DETECTED  ? Klebsiella oxytoca NOT DETECTED NOT DETECTED  ? Klebsiella pneumoniae NOT DETECTED NOT DETECTED  ? Proteus species NOT DETECTED NOT DETECTED  ? Salmonella species NOT DETECTED NOT DETECTED  ? Serratia marcescens NOT DETECTED NOT DETECTED  ? Haemophilus influenzae NOT DETECTED NOT DETECTED  ? Neisseria meningitidis NOT DETECTED NOT DETECTED  ? Pseudomonas aeruginosa NOT DETECTED NOT DETECTED  ? Stenotrophomonas maltophilia NOT DETECTED NOT DETECTED  ? Candida albicans NOT DETECTED NOT  DETECTED  ? Candida auris NOT DETECTED NOT DETECTED  ? Candida glabrata NOT DETECTED NOT DETECTED  ? Candida krusei NOT DETECTED NOT DETECTED  ? Candida parapsilosis NOT DETECTED NOT DETECTED  ? Candida tropicalis NOT DETECTED NOT DETECTED  ? Cryptococcus neoformans/gattii NOT DETECTED NOT DETECTED  ? ? ?Abran Duke ?05/18/2021  3:01 AM ? ?

## 2021-05-19 ENCOUNTER — Inpatient Hospital Stay (HOSPITAL_COMMUNITY): Payer: Medicaid Other

## 2021-05-19 DIAGNOSIS — I4892 Unspecified atrial flutter: Secondary | ICD-10-CM | POA: Diagnosis present

## 2021-05-19 DIAGNOSIS — Z4682 Encounter for fitting and adjustment of non-vascular catheter: Secondary | ICD-10-CM | POA: Diagnosis not present

## 2021-05-19 DIAGNOSIS — K7200 Acute and subacute hepatic failure without coma: Secondary | ICD-10-CM | POA: Diagnosis present

## 2021-05-19 DIAGNOSIS — I2609 Other pulmonary embolism with acute cor pulmonale: Secondary | ICD-10-CM | POA: Diagnosis present

## 2021-05-19 DIAGNOSIS — R609 Edema, unspecified: Secondary | ICD-10-CM | POA: Diagnosis not present

## 2021-05-19 DIAGNOSIS — A419 Sepsis, unspecified organism: Secondary | ICD-10-CM | POA: Diagnosis not present

## 2021-05-19 DIAGNOSIS — J80 Acute respiratory distress syndrome: Secondary | ICD-10-CM | POA: Diagnosis not present

## 2021-05-19 DIAGNOSIS — Z981 Arthrodesis status: Secondary | ICD-10-CM | POA: Diagnosis not present

## 2021-05-19 DIAGNOSIS — I5020 Unspecified systolic (congestive) heart failure: Secondary | ICD-10-CM | POA: Diagnosis present

## 2021-05-19 LAB — COMPREHENSIVE METABOLIC PANEL
ALT: 29 U/L (ref 0–44)
AST: 25 U/L (ref 15–41)
Albumin: 2.5 g/dL — ABNORMAL LOW (ref 3.5–5.0)
Alkaline Phosphatase: 111 U/L (ref 38–126)
Anion gap: 7 (ref 5–15)
BUN: 20 mg/dL (ref 6–20)
CO2: 30 mmol/L (ref 22–32)
Calcium: 8.1 mg/dL — ABNORMAL LOW (ref 8.9–10.3)
Chloride: 97 mmol/L — ABNORMAL LOW (ref 98–111)
Creatinine, Ser: 1.11 mg/dL — ABNORMAL HIGH (ref 0.44–1.00)
GFR, Estimated: >60 mL/min (ref >60)
Glucose, Bld: 129 mg/dL — ABNORMAL HIGH (ref 70–99)
Potassium: 4.6 mmol/L (ref 3.5–5.1)
Sodium: 134 mmol/L — ABNORMAL LOW (ref 135–145)
Total Bilirubin: 0.7 mg/dL (ref 0.3–1.2)
Total Protein: 5.6 g/dL — ABNORMAL LOW (ref 6.5–8.1)

## 2021-05-19 LAB — POCT I-STAT 7, (LYTES, BLD GAS, ICA,H+H)
Acid-Base Excess: 3 mmol/L — ABNORMAL HIGH (ref 0.0–2.0)
Acid-Base Excess: 2 mmol/L (ref 0.0–2.0)
Acid-Base Excess: 18 mmol/L — ABNORMAL HIGH (ref 0.0–2.0)
Acid-Base Excess: 9 mmol/L — ABNORMAL HIGH (ref 0.0–2.0)
Bicarbonate: 34.2 mmol/L — ABNORMAL HIGH (ref 20.0–28.0)
Bicarbonate: 33.4 mmol/L — ABNORMAL HIGH (ref 20.0–28.0)
Bicarbonate: 45 mmol/L — ABNORMAL HIGH (ref 20.0–28.0)
Bicarbonate: 32.4 mmol/L — ABNORMAL HIGH (ref 20.0–28.0)
Calcium, Ion: 1.21 mmol/L (ref 1.15–1.40)
Calcium, Ion: 1.21 mmol/L (ref 1.15–1.40)
Calcium, Ion: 1.12 mmol/L — ABNORMAL LOW (ref 1.15–1.40)
Calcium, Ion: 1.06 mmol/L — ABNORMAL LOW (ref 1.15–1.40)
HCT: 49 % — ABNORMAL HIGH (ref 36.0–46.0)
HCT: 49 % — ABNORMAL HIGH (ref 36.0–46.0)
HCT: 47 % — ABNORMAL HIGH (ref 36.0–46.0)
HCT: 45 % (ref 36.0–46.0)
Hemoglobin: 16.7 g/dL — ABNORMAL HIGH (ref 12.0–15.0)
Hemoglobin: 16.7 g/dL — ABNORMAL HIGH (ref 12.0–15.0)
Hemoglobin: 16 g/dL — ABNORMAL HIGH (ref 12.0–15.0)
Hemoglobin: 15.3 g/dL — ABNORMAL HIGH (ref 12.0–15.0)
O2 Saturation: 88 %
O2 Saturation: 90 %
O2 Saturation: 92 %
O2 Saturation: 96 %
Patient temperature: 99.1
Patient temperature: 97.9
Patient temperature: 98.2
Patient temperature: 98.8
Potassium: 4.6 mmol/L (ref 3.5–5.1)
Potassium: 4.5 mmol/L (ref 3.5–5.1)
Potassium: 3.4 mmol/L — ABNORMAL LOW (ref 3.5–5.1)
Potassium: 3.2 mmol/L — ABNORMAL LOW (ref 3.5–5.1)
Sodium: 135 mmol/L (ref 135–145)
Sodium: 136 mmol/L (ref 135–145)
Sodium: 136 mmol/L (ref 135–145)
Sodium: 135 mmol/L (ref 135–145)
TCO2: 37 mmol/L — ABNORMAL HIGH (ref 22–32)
TCO2: 36 mmol/L — ABNORMAL HIGH (ref 22–32)
TCO2: 47 mmol/L — ABNORMAL HIGH (ref 22–32)
TCO2: 34 mmol/L — ABNORMAL HIGH (ref 22–32)
pCO2 arterial: 85.7 mmHg (ref 32–48)
pCO2 arterial: 84.3 mmHg (ref 32–48)
pCO2 arterial: 57.4 mmHg — ABNORMAL HIGH (ref 32–48)
pCO2 arterial: 39.9 mmHg (ref 32–48)
pH, Arterial: 7.211 — ABNORMAL LOW (ref 7.35–7.45)
pH, Arterial: 7.204 — ABNORMAL LOW (ref 7.35–7.45)
pH, Arterial: 7.501 — ABNORMAL HIGH (ref 7.35–7.45)
pH, Arterial: 7.519 — ABNORMAL HIGH (ref 7.35–7.45)
pO2, Arterial: 70 mmHg — ABNORMAL LOW (ref 83–108)
pO2, Arterial: 73 mmHg — ABNORMAL LOW (ref 83–108)
pO2, Arterial: 59 mmHg — ABNORMAL LOW (ref 83–108)
pO2, Arterial: 73 mmHg — ABNORMAL LOW (ref 83–108)

## 2021-05-19 LAB — CBC
HCT: 48.6 % — ABNORMAL HIGH (ref 36.0–46.0)
Hemoglobin: 15.2 g/dL — ABNORMAL HIGH (ref 12.0–15.0)
MCH: 32.3 pg (ref 26.0–34.0)
MCHC: 31.3 g/dL (ref 30.0–36.0)
MCV: 103.2 fL — ABNORMAL HIGH (ref 80.0–100.0)
Platelets: 220 10*3/uL (ref 150–400)
RBC: 4.71 MIL/uL (ref 3.87–5.11)
RDW: 15.9 % — ABNORMAL HIGH (ref 11.5–15.5)
WBC: 13.7 10*3/uL — ABNORMAL HIGH (ref 4.0–10.5)
nRBC: 0 % (ref 0.0–0.2)

## 2021-05-19 LAB — LACTIC ACID, PLASMA
Lactic Acid, Venous: 1.5 mmol/L (ref 0.5–1.9)
Lactic Acid, Venous: 1.4 mmol/L (ref 0.5–1.9)

## 2021-05-19 LAB — GLUCOSE, CAPILLARY
Glucose-Capillary: 138 mg/dL — ABNORMAL HIGH (ref 70–99)
Glucose-Capillary: 140 mg/dL — ABNORMAL HIGH (ref 70–99)
Glucose-Capillary: 140 mg/dL — ABNORMAL HIGH (ref 70–99)
Glucose-Capillary: 109 mg/dL — ABNORMAL HIGH (ref 70–99)
Glucose-Capillary: 138 mg/dL — ABNORMAL HIGH (ref 70–99)
Glucose-Capillary: 163 mg/dL — ABNORMAL HIGH (ref 70–99)

## 2021-05-19 LAB — HEPARIN LEVEL (UNFRACTIONATED): Heparin Unfractionated: 0.47 IU/mL (ref 0.30–0.70)

## 2021-05-19 LAB — COOXEMETRY PANEL
Carboxyhemoglobin: 1.5 % (ref 0.5–1.5)
Methemoglobin: 0.7 % (ref 0.0–1.5)
O2 Saturation: 77.8 %
Total hemoglobin: 15.4 g/dL (ref 12.0–16.0)

## 2021-05-19 LAB — CBG MONITORING, ED: Glucose-Capillary: 230 mg/dL — ABNORMAL HIGH (ref 70–99)

## 2021-05-19 MED ORDER — VANCOMYCIN HCL 750 MG/150ML IV SOLN
750.0000 mg | Freq: Two times a day (BID) | INTRAVENOUS | Status: DC
  Administered 2021-05-19 – 2021-05-22 (×6): 750 mg via INTRAVENOUS
  Filled 2021-05-19 (×7): qty 150

## 2021-05-19 MED ORDER — MILRINONE LACTATE IN DEXTROSE 20-5 MG/100ML-% IV SOLN
0.1250 ug/kg/min | INTRAVENOUS | Status: DC
  Administered 2021-05-20: 0.25 ug/kg/min via INTRAVENOUS
  Filled 2021-05-19: qty 100

## 2021-05-19 MED ORDER — AMIODARONE LOAD VIA INFUSION
150.0000 mg | Freq: Once | INTRAVENOUS | Status: AC
  Administered 2021-05-19: 150 mg via INTRAVENOUS
  Filled 2021-05-19: qty 83.34

## 2021-05-19 MED ORDER — FUROSEMIDE 10 MG/ML IJ SOLN
80.0000 mg | Freq: Two times a day (BID) | INTRAMUSCULAR | Status: DC
  Administered 2021-05-19 – 2021-05-22 (×7): 80 mg via INTRAVENOUS
  Filled 2021-05-19 (×7): qty 8

## 2021-05-19 MED ORDER — VITAL 1.5 CAL PO LIQD
1000.0000 mL | ORAL | Status: DC
  Administered 2021-05-19: 1000 mL
  Filled 2021-05-19: qty 1000

## 2021-05-19 MED ORDER — MILRINONE LACTATE IN DEXTROSE 20-5 MG/100ML-% IV SOLN
0.3750 ug/kg/min | INTRAVENOUS | Status: DC
  Administered 2021-05-19: 0.375 ug/kg/min via INTRAVENOUS
  Administered 2021-05-19: 0.25 ug/kg/min via INTRAVENOUS
  Filled 2021-05-19 (×2): qty 100

## 2021-05-19 MED ORDER — STERILE WATER FOR INJECTION IJ SOLN
10.0000 ng/kg/min | INTRAVENOUS | Status: DC
  Administered 2021-05-19 – 2021-05-21 (×6): 50 ng/kg/min via RESPIRATORY_TRACT
  Filled 2021-05-19 (×8): qty 5

## 2021-05-19 MED ORDER — VITAL HIGH PROTEIN PO LIQD: 1000.0000 mL | ORAL | Status: DC

## 2021-05-19 MED ORDER — AMIODARONE HCL IN DEXTROSE 360-4.14 MG/200ML-% IV SOLN
60.0000 mg/h | INTRAVENOUS | Status: AC
  Administered 2021-05-19 (×2): 60 mg/h via INTRAVENOUS
  Filled 2021-05-19 (×2): qty 200

## 2021-05-19 MED ORDER — AMIODARONE HCL IN DEXTROSE 360-4.14 MG/200ML-% IV SOLN
30.0000 mg/h | INTRAVENOUS | Status: DC
  Administered 2021-05-19 – 2021-05-21 (×4): 30 mg/h via INTRAVENOUS
  Filled 2021-05-19 (×2): qty 200

## 2021-05-19 NOTE — Assessment & Plan Note (Addendum)
Severe RV dysfunction on echocardiogram with pressue volume overload.  Developed tachycardia with milrinone. Milrinone off, off epoprostenol.  Follow-up point-of-care echo shows persistent RV dysfunction with last septal dyssynchrony. ?SCV O2 remains normal. ? ?-Continue diuresis.  No inotropic support at this time. ?-Decrease IV diuresis rate as been developing contraction alkalosis ? ? ? ?

## 2021-05-19 NOTE — Assessment & Plan Note (Deleted)
AST ALT have now normalized. ?

## 2021-05-19 NOTE — Progress Notes (Signed)
RT note. ?Per Dr. Denese Killings, to titrate Fi02 based on pulse ox.  ?Keep pulse ox above 90%. Patient turned down to 85% at this time by Dr. Denese Killings. RT will continue to  monitor.  ?

## 2021-05-19 NOTE — Assessment & Plan Note (Addendum)
Currently requiring low-dose norepinephrine. S.epidermidis .  However, shock may be largely due to RV dysfunction. ? ?-Titrate norepinephrine to keep MAP greater than 65.  Likely offsetting sedation at this point. ?-Off milrinone and epoprostenol ?-Lactate has cleared. ?

## 2021-05-19 NOTE — Assessment & Plan Note (Addendum)
EF 20%, change from echo 2 years ago.  Possibly sepsis induced myocardial dysfunction. ?Likely also component of diastolic dysfunction with restrictive filling from RV pressure volume overload. ? ?-Continue furosemide infusion as finally impacting volume overload.  Ideally would like to see her at least 10 L net negative before transitioning back to intermittent furosemide. ?

## 2021-05-19 NOTE — Assessment & Plan Note (Addendum)
Refractory hypoxia and hypercarbia due to predominantly right-sided airspace disease and possible superimposed pulmonary edema. ?Improved oxygenation with epoprostenol, diuresis and reduction in PEEP to prevent over distention. ?Tolerated paralytic interruption.  ?Hypercarbia on 6cc/kg with dyssynchrony.  Better on 8 mL/kg with correction hypercarbia.  Airway pressures remain acceptable ? ?-Continue lung protective ventilation targeting plateau pressure less than 30 torr and tidal volume8 mL/kg (6 cc equals 312 mL, 8 cc 416 mL)  ?-Continue to wean PEEP to 5 if possible ?-Follow daily ABG to adjust respiratory rate to maintain normal carbon ?-Continue  weaning sedation, target RASS -1, -2 ? ?

## 2021-05-19 NOTE — Progress Notes (Signed)
? ?NAME:  [name], MRN:  [id number], DOB:  Mar 02, 1970, LOS: 3 ?ADMISSION DATE:  05/16/2021, CONSULTATION DATE:  5/5 ?REFERRING MD:  Dr. Sabra Heck, CHIEF COMPLAINT:  Acute respiratory failure w/ hypercapnea/hypoxia ? ?History of Present Illness:  ?Patient is a 51 year old female with pertinent PMH of anxiety, depression, chronic pain syndrome on multiple psych meds presents to [facility] on 5/5 with acute respiratory failure. ? ?Patient recently admitted 02/14/2021 with similar episode of respiratory failure with sepsis and aspiration pneumonia and patient was intubated.  Patient left AMA on 02/21/2021.  Patient at home takes multiple psych meds: Xanax, amitriptyline, Lyrica, Trintellix, Ambien, citalopram. ? ?On 5/5, family went to see patient at home and found unresponsive on floor.  EMS called and patient was unresponsive and sats in 80s.  Patient bagged and transported to [facility] ED.  On arrival to [facility] ED patient GCS very low.  Breathing spontaneously but requiring BVM.  Intubated for hypercarbic hypoxic respiratory failure. ? ? ?Pertinent  Medical History  ? ?Past Medical History:  ?Diagnosis Date  ? Anxiety   ? Back pain   ? Chronic back pain 02/04/13  ? By patient report  ? Depression   ? Factor 5 Leiden mutation, heterozygous ([place])   ? Hypertension   ? Laryngitis, chronic   ? Panic attack   ? Scoliosis   ? Ulcer   ? ? ?Significant Hospital Events: ?Including procedures, antibiotic start and stop dates in addition to other pertinent events   ?5/5 admitted to [facility] intubated for resp failure; transferring to [facility]  ?5/6 slight hypoxia this a.m. on vent requiring increase of PEEP from 5-8 and up titration of FiO2 from 70-100 ?5/7 notified of positive blood cultures for night, antibiotic regiment remains seen with broad coverage.  She is more alert and oriented this a.m. ?5/8 severe RV dysfunction.  Persistent hypoxia and hypercarbia. ? ?Interim History / Subjective:  ?Started on paralytics yesterday. ? ?Objective   ?Blood  pressure (!) 105/93, pulse (!) 125, temperature (!) 97.5 ?F (36.4 ?C), temperature source Oral, resp. rate (!) 30, height 5\' 3"  (1.6 m), weight 119.1 kg, SpO2 (!) 88 %. ?CVP:  [22 mmHg-29 mmHg] 22 mmHg  ?Vent Mode: PRVC ?FiO2 (%):  [100 %] 100 % ?Set Rate:  [26 bmp-30 bmp] 30 bmp ?Vt Set:  [320 mL] 320 mL ?PEEP:  [8 cmH20-16 cmH20] 16 cmH20 ?Plateau Pressure:  [21 [id number] cmH20] 27 cmH20  ? ?Intake/Output Summary (Last 24 hours) at 05/19/2021 0917 ?Last data filed at 05/19/2021 0600 ?Gross per 24 hour  ?Intake 3427.65 ml  ?Output 1090 ml  ?Net 2337.65 ml  ? ? ?Filed Weights  ? 05/16/21 2143 05/17/21 1300 05/19/21 0400  ?Weight: 94 kg 94 kg 119.1 kg  ? ? ?Examination: ?General: Acute on chronic ill-appearing middle-aged female lying in bed on mechanical ventilation in no acute distress.  Sedated and paralyzed. ?HEENT: ETT, MM pink/moist, PERRL,  ?Neuro: Responsive.  Bis 31. ?CV: s1s2 regular rate and rhythm, no murmur, rubs, or gallops,  ?PULM: Bronchovesicular breath sounds right side. ?GI: soft, bowel sounds active in all 4 quadrants, non-tender, non-distended ?Extremities: warm/dry, 2+ pitting edema ?Skin: Ecchymotic rashes on both arms ? ?Point-of-care echocardiogram shows severe RV and LV dysfunction.  LVEF 20% no MR.  Indeterminate diastolic function but likely restrictive pattern.  RV enlarged with severely decreased function and septal shift consistent with pressure volume overload.  Distended IVC at 3 cm with no respiratory variation consistent with RAP of 20.  Moderate to severe  pulmonary hypertension by RVSP ? ?Resolved Hospital Problem list   ? ? ?Assessment & Plan:  ?Cor pulmonale, acute ([place]) ?Severe RV dysfunction on echocardiogram with pressue volume overload.  ? ?- Start epoprostenol  ?- Start milrinone ? ? ?Respiratory failure with hypoxia and hypercapnia (HCC) ?Refractory hypoxia and hypercarbia due to predominantly right-sided airspace disease and possible superimposed pulmonary edema. ? ?-Poor ECMO  candidate given concurrent cardiomyopathy. ?-Continue lung protective ventilation targeting plateau pressure less than 30 torr and tidal volume less than 8 mL/kg (6 cc equals 312 mL, 8 cc 416 mL)  ?-Trial of diuresis and pulmonary vasodilators. ?-Consider proning if no improvement on above. ?-Continue paralytics at least today. ?- Increase tidal volume to 8 cc/kg to correct hypecarbia ?- Optimize PEEP by Stress/Strain - predominant right disease may limit ability to recruit. ?-Try left lateral decubitus positioning. ? ?ARDS (adult respiratory distress syndrome) ([place]) ?Bilateral airspace disease predominantly on the right side due to right lung aspiration.  Asymmetry limits utility of PEEP. ? ?-Continue lung protective ventilation. ? ?Septic shock ([place]) ?Currently requiring low-dose norepinephrine.  GPC on blood culture, yet to be speciated.  However, shock may be largely due to RV dysfunction. ? ?-Titrate norepinephrine to keep MAP greater than 65. ?-Lactate to monitor adequacy of resuscitation. ? ?Acute encephalopathy ?Currently sedated to RASS -5 with BIS 32 on paralytic infusion. ? ?-Continue propofol and fentanyl infusions targeting BIS less than 40 while on paralytic. ? ?HFrEF (heart failure with reduced ejection fraction) ([place]) ?EF 20%, change from echo 2 years ago.  Possibly sepsis induced myocardial dysfunction. ?Likely also component of diastolic dysfunction with restrictive filling from RV pressure volume overload. ? ?-Milrinone for LV support. ?-Diuresis. ? ?Atrial flutter ([place]) ?Now back in SR ? ?-Monitor for tachyarrhythmias with initiation of milrinone. ? ?Shock liver ?AST ALT have now normalized. ? ?Best Practice (right click and "Reselect all SmartList Selections" daily)  ? ?Diet/type: NPO w/ meds via tube start tube feeds today ?DVT prophylaxis: Systemic heparin. ?GI prophylaxis: PPI ?Lines: Central line ?Foley:  Yes, and it is still needed ?Code Status:  full code ?Last date of multidisciplinary  goals of care discussion: 5/6 ? ?CRITICAL CARE ?Performed by: Kipp Brood ? ? ?Total critical care time: 60 minutes ? ?Critical care time was exclusive of separately billable procedures and treating other patients. ? ?Critical care was necessary to treat or prevent imminent or life-threatening deterioration. ? ?Critical care was time spent personally by me on the following activities: development of treatment plan with patient and/or surrogate as well as nursing, discussions with consultants, evaluation of patient's response to treatment, examination of patient, obtaining history from patient or surrogate, ordering and performing treatments and interventions, ordering and review of laboratory studies, ordering and review of radiographic studies, pulse oximetry, re-evaluation of patient's condition and participation in multidisciplinary rounds. ? ?Kipp Brood, MD FRCPC ?ICU Physician ?[place]  ?Pager: [phone] ?Mobile: [phone] ?After hours: [phone]. ? ?05/19/2021, 9:17 AM ? ? ? ?

## 2021-05-19 NOTE — TOC Progression Note (Signed)
Transition of Care (TOC) - Initial/Assessment Note  ? ? ?Patient Details  ?Name: [name] ?MRN: [id number] ?Date of Birth: 11-21-1970 ? ?Transition of Care (TOC) CM/SW Contact:    ?Catalina Pizza Higinio Grow, LCSWA ?Phone Number: ?05/19/2021, 3:22 PM ? ?Clinical Narrative:                 ?Patient presented from home with Acute respiratory failure with hypoxia/hypercapnia and likely septic. Patient is currently on ventilator.   ? ?Transition of Care Department [facility]) has reviewed patient and no TOC needs have been identified at this time. We will continue to monitor patient advancement through interdisciplinary progression rounds. If new patient transition needs arise, please place a TOC consult. ?  ? ?Patient Goals and CMS Choice ?  ?  ?  ? ?Expected Discharge Plan and Services ?  ?  ?  ?  ?  ?                ?  ?  ?  ?  ?  ?  ?  ?  ?  ?  ? ?Prior Living Arrangements/Services ?  ?  ?  ?       ?  ?  ?  ?  ? ?Activities of Daily Living ?  ?  ? ?Permission Sought/Granted ?  ?  ?   ?   ?   ?   ? ?Emotional Assessment ?  ?  ?  ?  ?  ?  ? ?Admission diagnosis:  Acute respiratory failure with hypercapnia (HCC) [J96.02] ?Hypercapnic respiratory failure (HCC) [J96.92] ?Sepsis, due to unspecified organism, unspecified whether acute organ dysfunction present (HCC) [A41.9] ?Patient Active Problem List  ? Diagnosis Date Noted  ? HFrEF (heart failure with reduced ejection fraction) (HCC) 05/19/2021  ? Atrial flutter (HCC) 05/19/2021  ? Shock liver 05/19/2021  ? Cor pulmonale, acute (HCC) 05/19/2021  ? ARDS (adult respiratory distress syndrome) (HCC)   ? Septic shock (HCC)   ? Acute encephalopathy   ? Factor V Leiden mutation (HCC) 02/14/2021  ? Respiratory failure with hypoxia and hypercapnia (HCC) 02/14/2021  ? Lactic acidosis 02/14/2021  ? H/O gastroesophageal reflux (GERD) 02/14/2021  ? Hyperlipidemia 02/14/2021  ? Generalized weakness 08/31/2016  ? History of embolectomy of left arm.  12/11/2015  ? Depression with anxiety 12/11/2015   ? Essential hypertension 12/11/2015  ? Chronic back pain 12/11/2015  ? Ischemia of hand 12/06/2015  ? 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 Knee pain 05/31/2012  ? Bilateral leg weakness 11/20/2010  ? ?PCP:  Gareth Morgan, MD ?Pharmacy:   ?[facility] - [place], [place] - [address] ?[address] ?  [address] ?Phone: [phone] Fax: [phone] ? ? ? ? ?Social Determinants of Health (SDOH) Interventions ?  ? ?Readmission Risk Interventions ?   ? View : No data to display.  ?  ?  ?  ? ? ? ?

## 2021-05-19 NOTE — Assessment & Plan Note (Addendum)
Off NMB and eyes opening but not tracking. ? ?-Start weaning sedation, now targeting RASS -1, -2 ?

## 2021-05-19 NOTE — Assessment & Plan Note (Addendum)
Bilateral airspace disease predominantly on the right side due to right lung aspiration.  Asymmetry limits utility of PEEP. ? ?-Continue lung protective ventilation. ?- Complete course of antibiotics for possible  ?

## 2021-05-19 NOTE — Progress Notes (Addendum)
ANTICOAGULATION & ANTIBIOTIC CONSULT NOTE - Follow Up Consult ? ?Pharmacy Consult for Heparin ?Indication: concern of PE ? ?Allergies  ?Allergen Reactions  ? Penicillins Anaphylaxis  ?  Has patient had a PCN reaction causing immediate rash, facial/tongue/throat swelling, SOB or lightheadedness with hypotension: No ?Has patient had a PCN reaction causing severe rash involving mucus membranes or skin necrosis: No ?Has patient had a PCN reaction that required hospitalization No ?Has patient had a PCN reaction occurring within the last 10 years: No ?If all of the above answers are "NO", then may proceed with Cephalosporin use. ?  ? Ibuprofen Nausea And Vomiting  ?  GI upset, Shakes  ? Peppermint Flavor [Flavoring Agent]   ?  Allergic to pepper the spice  ? Aspirin Nausea And Vomiting  ?  Gi upset  ? ? ?Patient Measurements: ?Height: 5\' 3"  (160 cm) ?Weight: 119.1 kg (262 lb 9.1 oz) ?IBW/kg (Calculated) : 52.4 ?Heparin Dosing Weight: 74.1 kg  ? ?Vital Signs: ?Temp: 98.7 ?F (37.1 ?C) (05/08 0400) ?Temp Source: Bladder (05/08 0400) ?BP: 105/93 (05/08 0000) ?Pulse Rate: 125 (05/08 0658) ? ?Labs: ?Recent Labs  ?  05/16/21 ?2130 05/17/21 ?[id number] 05/17/21 ?0355 05/17/21 ?1337 05/18/21 ?0409 05/18/21 ?0623 05/18/21 ?1228 05/18/21 ?1400 05/18/21 ?1407 05/18/21 ?1802 05/19/21 ?[id number] 05/19/21 ?[id number]  ?HGB 14.1   < > 14.2   < > 14.9  --    < >  --    < > 17.7* 15.2* 16.7*  ?HCT 47.4*   < > 43.2   < > 45.6  --    < >  --    < > 52.0* 48.6* 49.0*  ?PLT 209   < > 186  --  169  --   --   --   --   --  220  --   ?APTT 34  --   --   --   --   --   --   --   --   --   --   --   ?LABPROT 17.8*  --   --   --   --   --   --   --   --   --   --   --   ?INR 1.5*  --   --   --   --   --   --   --   --   --   --   --   ?HEPARINUNFRC  --   --   --    < >  --  0.30  --  0.46  --   --  0.47  --   ?CREATININE 1.59*  --  1.59*  --  1.43*  --   --   --   --   --  1.11*  --   ?TROPONINIHS  --    < > 55*  --  19* 21*  --   --   --   --   --   --   ? < > =  values in this interval not displayed.  ? ? ? ?Estimated Creatinine Clearance: 75.7 mL/min (A) (by C-G formula based on SCr of 1.11 mg/dL (H)). ? ? ?Medical History: ?Past Medical History:  ?Diagnosis Date  ? Anxiety   ? Back pain   ? Chronic back pain 02/04/13  ? By patient report  ? Depression   ? Factor 5 Leiden mutation, heterozygous ([place])   ? Hypertension   ? Laryngitis, chronic   ?  Panic attack   ? Scoliosis   ? Ulcer   ? ? ? ?Assessment: ?51 yo female admitted on 05/16/2021. Pharmacy consulted for dose heparin for concern of PE given vent settings in relation to imaging. No d-dimer. No anticoagulation prior to admission. SQH last given at 1315 on 5/6.  ? ?Heparin level of 0.47 is therapeutic on heparin 1600 units/hr. Hgb 16.7. Plt wnl. Slight pink secretions noted overnight on 5/6 but resolved on 5/7 daytime, currently NGT output brown and no other signs of bleeding. No issues IV access or infusion.  ? ?Pharmacy also consulted to dose vancomycin. Trach aspirate with rare strep pneumoniae and rare group B strep, culture reincubated and blood cx 1/4 GPC. Patient currently on vancomycin. PCN allergy - daughter reported throat swelling to pencillin at unknown time. Currently no vancomycin 750mg  q24h and scr improved to 1.11 with 990 ml UOP (diuresing).  ? ?Goal of Therapy:  ?Heparin level 0.3-0.7 units/ml ?Monitor platelets by anticoagulation protocol: Yes ?  ?Plan:  ?Continue heparin 1600 units/hr  ?Monitor heparin level, CBC and s/s of bleeding daily  ?Follow up PE work up  ? ?Adjust vancomycin to 750mg  q12h (eAUC 547 using scr 1.11 and 0.5 for Vd for BMI 46) ?Monitor renal function, cultures, and clinical progression ?Vancomycin levels as appropriate ? ? ?Cristela Felt, PharmD, BCPS ?Clinical Pharmacist ?05/19/2021 7:30 AM ? ? ? ?

## 2021-05-19 NOTE — Procedures (Signed)
Cortrak  Person Inserting Tube:  Nieves Chapa L, RD Tube Type:  Cortrak - 43 inches Tube Size:  10 Tube Location:  Right nare Secured by: Bridle Technique Used to Measure Tube Placement:  Marking at nare/corner of mouth Cortrak Secured At:  100 cm   Cortrak Tube Team Note:  Consult received to place a Cortrak feeding tube.   X-ray is required, abdominal x-ray has been ordered by the Cortrak team. Please confirm tube placement before using the Cortrak tube.   If the tube becomes dislodged please keep the tube and contact the Cortrak team at [email or web address] ([email or web address]) for replacement.  If after hours and replacement cannot be delayed, place a NG tube and confirm placement with an abdominal x-ray.    Letita Prentiss RD, LDN Clinical Dietitian See AMiON for contact information.    

## 2021-05-19 NOTE — Assessment & Plan Note (Deleted)
Now back in sinus tachycardia. ? ?-Completed 24 hours amiodarone infusion. ?

## 2021-05-19 NOTE — Progress Notes (Signed)
VASCULAR LAB ? ? ? ?Bilateral lower extremity venous duplex has been performed. ? ?See CV proc for preliminary results. ? ? ?Briscoe Daniello, RVT ?05/19/2021, 10:43 AM ? ?

## 2021-05-19 NOTE — Progress Notes (Signed)
Initial Nutrition Assessment ? ?DOCUMENTATION CODES:  ? ?Not applicable ? ?INTERVENTION:  ? ?Once Cortrak placed and placement is confirmed via x-ray, initiate trickle tube feeds: ?- Vital 1.5 @ 20 ml/hr (480 ml/day) ? ?Trickle tube feeding regimen provides 720 kcal, 32 grams of protein, and 367 ml of H2O (meets 36% of minimum kcal needs and 30% of minimum protein needs). ? ? ?RD will monitor for ability to advance tube feeds to goal: ?- Vital 1.5 @ 55 ml/hr (1320 ml/day) ?- ProSource TF 45 ml BID ? ?Recommended tube feeding regimen at goal rate would provide 2060 kcal, 111 grams of protein, and 1008 ml of H2O.  ? ?NUTRITION DIAGNOSIS:  ? ?Inadequate oral intake related to inability to eat as evidenced by NPO status. ? ?GOAL:  ? ?Patient will meet greater than or equal to 90% of their needs ? ?MONITOR:  ? ?Vent status, Labs, Weight trends, TF tolerance, I & O's ? ?REASON FOR ASSESSMENT:  ? ?Ventilator, Consult ?Enteral/tube feeding initiation and management (trickle tube feeds) ? ?ASSESSMENT:  ? ?51 year old female who presented to the ED on 5/05 after being found unresponsive. Pt required intubation in the ED. PMH of anxiety, depression, chronic pain syndrome, HTN, GERD, polysubstance abuse. Pt admitted with hypercapnic and hypoxemic respiratory failure, shock in the setting of gram-positive bacteremia with possible mixed cardiogenic shock, acute encephalopathy, ARDS, severe systolic and diastolic CHF. ? ?05/07 - pt started on paralytics ?05/08 - plan for Cortrak placement ? ?RD working remotely. ? ?RD consulted for trickle tube feeding initiation and management. Pt currently with OG tube in stomach to low intermittent suction. Discussed pt with CCM MD. Given consideration of proning if pt does not improve with current interventions, plan for Cortrak placement. RN made aware. ? ?Unable to obtain diet weight weight history at this time. Noted weight has slowly trended up over the last 5-6 years. Suspect dry weight  is close to admit weight of 94 kg. ? ?Admit weight: 94 kg ?Current weight: 119.1 kg ? ?Pt with moderate pitting generalized edema, moderate pitting edema to RUE and BLE, and deep pitting edema to LUE. ? ?Patient is currently intubated on ventilator support ?MV: 12 L/min ?Temp (24hrs), Avg:98.3 ?F (36.8 ?C), Min:97.5 ?F (36.4 ?C), Max:99.1 ?F (37.3 ?C) ?BP (a-line): 93/59 ?MAP (a-line): 68 ? ?Drips: ?Propofol: 22.6 ml/hr (provides 597 kcal daily from lipid) ?Fentanyl ?Vecuronium ?Heparin ?Milrinone ?Levophed ? ?Medications reviewed and include: colace, IV lasix, lactulose 10 grams TID, protonix, miralax, sucralfate, veletri for inhalation, IV abx ? ?Labs reviewed: creatinine 1.11, WBC 13.7 ?CBG's: 136-151 x 24 hours ? ?UOP: 990 ml x 24 hours ?OGT: 450 ml x 24 hours ?I/O's: +3.9 L since admit ? ?NUTRITION - FOCUSED PHYSICAL EXAM: ? ?Unable to complete at this time. RD working remotely. ? ?Diet Order:   ?Diet Order   ? ?       ?  Diet NPO time specified  Diet effective now       ?  ? ?  ?  ? ?  ? ? ?EDUCATION NEEDS:  ? ?No education needs have been identified at this time ? ?Skin:  Skin Assessment: Reviewed RN Assessment ? ?Last BM:  no documented BM ? ?Height:  ? ?Ht Readings from Last 1 Encounters:  ?05/17/21 5\' 3"  (1.6 m)  ? ? ?Weight:  ? ?Wt Readings from Last 1 Encounters:  ?05/19/21 119.1 kg  ? ? ?Ideal Body Weight:  52.3 kg ? ?BMI:  Body mass index is 46.51 kg/m?07/19/21 ? ?  Estimated Nutritional Needs:  ? ?Kcal:  2000-2200 ? ?Protein:  105-120 grams ? ?Fluid:  >1.8 L ? ? ? ?Mertie Clause, MS, RD, LDN ?Inpatient Clinical Dietitian ?Please see AMiON for contact information. ? ?

## 2021-05-20 DIAGNOSIS — A419 Sepsis, unspecified organism: Secondary | ICD-10-CM | POA: Diagnosis not present

## 2021-05-20 DIAGNOSIS — J80 Acute respiratory distress syndrome: Secondary | ICD-10-CM | POA: Diagnosis not present

## 2021-05-20 LAB — COOXEMETRY PANEL
Carboxyhemoglobin: 1.7 % — ABNORMAL HIGH (ref 0.5–1.5)
Methemoglobin: <0.7 % (ref 0.0–1.5)
O2 Saturation: 93.5 %
Total hemoglobin: 14.2 g/dL (ref 12.0–16.0)

## 2021-05-20 LAB — GLUCOSE, CAPILLARY
Glucose-Capillary: 156 mg/dL — ABNORMAL HIGH (ref 70–99)
Glucose-Capillary: 133 mg/dL — ABNORMAL HIGH (ref 70–99)
Glucose-Capillary: 133 mg/dL — ABNORMAL HIGH (ref 70–99)
Glucose-Capillary: 112 mg/dL — ABNORMAL HIGH (ref 70–99)
Glucose-Capillary: 153 mg/dL — ABNORMAL HIGH (ref 70–99)
Glucose-Capillary: 163 mg/dL — ABNORMAL HIGH (ref 70–99)

## 2021-05-20 LAB — BASIC METABOLIC PANEL
Anion gap: 10 (ref 5–15)
BUN: 14 mg/dL (ref 6–20)
CO2: 31 mmol/L (ref 22–32)
Calcium: 8.1 mg/dL — ABNORMAL LOW (ref 8.9–10.3)
Chloride: 95 mmol/L — ABNORMAL LOW (ref 98–111)
Creatinine, Ser: 0.97 mg/dL (ref 0.44–1.00)
GFR, Estimated: >60 mL/min (ref >60)
Glucose, Bld: 147 mg/dL — ABNORMAL HIGH (ref 70–99)
Potassium: 3.6 mmol/L (ref 3.5–5.1)
Sodium: 136 mmol/L (ref 135–145)

## 2021-05-20 LAB — COMPREHENSIVE METABOLIC PANEL
ALT: 20 U/L (ref 0–44)
AST: 18 U/L (ref 15–41)
Albumin: 2.3 g/dL — ABNORMAL LOW (ref 3.5–5.0)
Alkaline Phosphatase: 95 U/L (ref 38–126)
Anion gap: 10 (ref 5–15)
BUN: 18 mg/dL (ref 6–20)
CO2: 32 mmol/L (ref 22–32)
Calcium: 7.7 mg/dL — ABNORMAL LOW (ref 8.9–10.3)
Chloride: 94 mmol/L — ABNORMAL LOW (ref 98–111)
Creatinine, Ser: 1.02 mg/dL — ABNORMAL HIGH (ref 0.44–1.00)
GFR, Estimated: >60 mL/min (ref >60)
Glucose, Bld: 152 mg/dL — ABNORMAL HIGH (ref 70–99)
Potassium: 3.2 mmol/L — ABNORMAL LOW (ref 3.5–5.1)
Sodium: 136 mmol/L (ref 135–145)
Total Bilirubin: 1 mg/dL (ref 0.3–1.2)
Total Protein: 5.2 g/dL — ABNORMAL LOW (ref 6.5–8.1)

## 2021-05-20 LAB — CULTURE, BLOOD (ROUTINE X 2): Special Requests: ADEQUATE

## 2021-05-20 LAB — POCT I-STAT 7, (LYTES, BLD GAS, ICA,H+H)
Acid-Base Excess: 9 mmol/L — ABNORMAL HIGH (ref 0.0–2.0)
Bicarbonate: 35.2 mmol/L — ABNORMAL HIGH (ref 20.0–28.0)
Calcium, Ion: 1.07 mmol/L — ABNORMAL LOW (ref 1.15–1.40)
HCT: 41 % (ref 36.0–46.0)
Hemoglobin: 13.9 g/dL (ref 12.0–15.0)
O2 Saturation: 97 %
Patient temperature: 98.2
Potassium: 3.1 mmol/L — ABNORMAL LOW (ref 3.5–5.1)
Sodium: 135 mmol/L (ref 135–145)
TCO2: 37 mmol/L — ABNORMAL HIGH (ref 22–32)
pCO2 arterial: 51 mmHg — ABNORMAL HIGH (ref 32–48)
pH, Arterial: 7.446 (ref 7.35–7.45)
pO2, Arterial: 89 mmHg (ref 83–108)

## 2021-05-20 LAB — CBC
HCT: 39.3 % (ref 36.0–46.0)
Hemoglobin: 13.5 g/dL (ref 12.0–15.0)
MCH: 32.8 pg (ref 26.0–34.0)
MCHC: 34.4 g/dL (ref 30.0–36.0)
MCV: 95.6 fL (ref 80.0–100.0)
Platelets: 153 10*3/uL (ref 150–400)
RBC: 4.11 MIL/uL (ref 3.87–5.11)
RDW: 15.1 % (ref 11.5–15.5)
WBC: 10.9 10*3/uL — ABNORMAL HIGH (ref 4.0–10.5)
nRBC: 0 % (ref 0.0–0.2)

## 2021-05-20 LAB — MAGNESIUM: Magnesium: 1.5 mg/dL — ABNORMAL LOW (ref 1.7–2.4)

## 2021-05-20 LAB — HEPARIN LEVEL (UNFRACTIONATED): Heparin Unfractionated: 0.35 IU/mL (ref 0.30–0.70)

## 2021-05-20 LAB — TRIGLYCERIDES: Triglycerides: 99 mg/dL (ref <150)

## 2021-05-20 MED ORDER — PROSOURCE TF PO LIQD
45.0000 mL | Freq: Two times a day (BID) | ORAL | Status: DC
  Administered 2021-05-20 – 2021-05-27 (×15): 45 mL
  Filled 2021-05-20 (×16): qty 45

## 2021-05-20 MED ORDER — POTASSIUM CHLORIDE 20 MEQ PO PACK
20.0000 meq | PACK | ORAL | Status: AC
  Administered 2021-05-20 (×2): 20 meq
  Filled 2021-05-20 (×2): qty 1

## 2021-05-20 MED ORDER — SENNA 8.6 MG PO TABS
1.0000 | ORAL_TABLET | Freq: Two times a day (BID) | ORAL | Status: DC
  Administered 2021-05-20 – 2021-05-21 (×4): 8.6 mg
  Filled 2021-05-20 (×4): qty 1

## 2021-05-20 MED ORDER — CALCIUM GLUCONATE-NACL 1-0.675 GM/50ML-% IV SOLN
1.0000 g | Freq: Once | INTRAVENOUS | Status: AC
  Administered 2021-05-20: 1000 mg via INTRAVENOUS
  Filled 2021-05-20: qty 50

## 2021-05-20 MED ORDER — VITAL 1.5 CAL PO LIQD
1000.0000 mL | ORAL | Status: DC
  Administered 2021-05-21 – 2021-05-26 (×7): 1000 mL
  Filled 2021-05-20: qty 1000

## 2021-05-20 MED ORDER — MAGNESIUM SULFATE 4 GM/100ML IV SOLN
4.0000 g | Freq: Once | INTRAVENOUS | Status: AC
  Administered 2021-05-20: 4 g via INTRAVENOUS
  Filled 2021-05-20: qty 100

## 2021-05-20 MED ORDER — POTASSIUM CHLORIDE 10 MEQ/50ML IV SOLN
10.0000 meq | INTRAVENOUS | Status: AC
  Administered 2021-05-20 (×4): 10 meq via INTRAVENOUS
  Filled 2021-05-20 (×4): qty 50

## 2021-05-20 MED ORDER — HEPARIN SODIUM (PORCINE) 5000 UNIT/ML IJ SOLN
5000.0000 [IU] | Freq: Three times a day (TID) | INTRAMUSCULAR | Status: DC
  Administered 2021-05-20 – 2021-05-23 (×9): 5000 [IU] via SUBCUTANEOUS
  Filled 2021-05-20 (×9): qty 1

## 2021-05-20 MED ORDER — INSULIN ASPART 100 UNIT/ML IJ SOLN
0.0000 [IU] | INTRAMUSCULAR | Status: DC
  Administered 2021-05-20: 1 [IU] via SUBCUTANEOUS
  Administered 2021-05-20 – 2021-05-21 (×3): 2 [IU] via SUBCUTANEOUS
  Administered 2021-05-21 (×2): 1 [IU] via SUBCUTANEOUS
  Administered 2021-05-21: 2 [IU] via SUBCUTANEOUS
  Administered 2021-05-21 – 2021-05-22 (×6): 1 [IU] via SUBCUTANEOUS
  Administered 2021-05-23: 2 [IU] via SUBCUTANEOUS
  Administered 2021-05-23: 1 [IU] via SUBCUTANEOUS
  Administered 2021-05-23: 2 [IU] via SUBCUTANEOUS
  Administered 2021-05-23: 1 [IU] via SUBCUTANEOUS
  Administered 2021-05-23 – 2021-05-24 (×7): 2 [IU] via SUBCUTANEOUS
  Administered 2021-05-25 (×2): 1 [IU] via SUBCUTANEOUS
  Administered 2021-05-25 (×2): 2 [IU] via SUBCUTANEOUS
  Administered 2021-05-26 (×2): 1 [IU] via SUBCUTANEOUS
  Administered 2021-05-26: 2 [IU] via SUBCUTANEOUS
  Administered 2021-05-26 (×3): 1 [IU] via SUBCUTANEOUS
  Administered 2021-05-27 (×2): 2 [IU] via SUBCUTANEOUS
  Administered 2021-05-27 – 2021-05-28 (×4): 1 [IU] via SUBCUTANEOUS
  Administered 2021-05-28 (×3): 2 [IU] via SUBCUTANEOUS
  Administered 2021-05-29: 3 [IU] via SUBCUTANEOUS
  Administered 2021-05-29 – 2021-05-30 (×5): 2 [IU] via SUBCUTANEOUS
  Administered 2021-05-30: 3 [IU] via SUBCUTANEOUS
  Administered 2021-05-30: 2 [IU] via SUBCUTANEOUS
  Administered 2021-05-30: 1 [IU] via SUBCUTANEOUS
  Administered 2021-05-30: 2 [IU] via SUBCUTANEOUS
  Administered 2021-05-30: 3 [IU] via SUBCUTANEOUS
  Administered 2021-05-31 (×3): 2 [IU] via SUBCUTANEOUS
  Administered 2021-05-31 (×2): 3 [IU] via SUBCUTANEOUS

## 2021-05-20 MED ORDER — POTASSIUM CHLORIDE 20 MEQ PO PACK
40.0000 meq | PACK | Freq: Once | ORAL | Status: AC
  Administered 2021-05-20: 40 meq
  Filled 2021-05-20: qty 2

## 2021-05-20 MED ORDER — BUMETANIDE 0.25 MG/ML IJ SOLN
1.0000 mg | Freq: Once | INTRAMUSCULAR | Status: AC
  Administered 2021-05-21: 1 mg via INTRAVENOUS
  Filled 2021-05-20: qty 4

## 2021-05-20 NOTE — Progress Notes (Signed)
? ?NAME:  [name], MRN:  [id number], DOB:  [birth year], LOS: 4 ?ADMISSION DATE:  05/16/2021, CONSULTATION DATE:  5/5 ?REFERRING MD:  Dr. Sabra Heck CHIEF COMPLAINT:  ARF  ? ?History of Present Illness:  ?Patient is a 51 year old female with pertinent PMH of anxiety, depression, chronic pain syndrome on multiple psych meds presents to [facility] on 5/5 with acute respiratory failure. ? ?Patient recently admitted 02/14/2021 with similar episode of respiratory failure with sepsis and aspiration pneumonia and patient was intubated.  Patient left AMA on 02/21/2021.  Patient at home takes multiple psych meds: Xanax, amitriptyline, Lyrica, Trintellix, Ambien, citalopram. ?  ?On 5/5, family went to see patient at home and found unresponsive on floor.  EMS called and patient was unresponsive and sats in 44s.  Patient bagged and transported to [facility] ED.  On arrival to [facility] ED patient GCS very low.  Breathing spontaneously but requiring BVM.  Intubated for hypercarbic hypoxic respiratory failure. ? ?Past Medical History:  ? ? Anxiety    ? Back pain    ? Chronic back pain 02/04/13  ?  By patient report  ? Depression    ? Factor 5 Leiden mutation, heterozygous ([place])    ? Hypertension    ? Laryngitis, chronic    ? Panic attack    ? Scoliosis    ? Ulcer   ? ? ?Significant Hospital Events:  ?5/5 admitted to [facility] intubated for resp failure; transferring to [facility]  ?5/6 slight hypoxia this a.m. on vent requiring increase of PEEP from 5-8 and up titration of FiO2 from 70-100 ?5/7 notified of positive blood cultures for night, antibiotic regiment remains seen with broad coverage.  She is more alert and oriented this a.m. ?5/8 severe LV and RV dysfunction, LVEF 20%.  Persistent hypoxia and hypercarbia.  ? ? ?Antimicrobials:  ?Vancomycin 5/5> ? ?Interim History / Subjective:  ?No acute overnight events. Remains sedated, paralyzed and intubated.  ? ?Objective   ?Blood pressure (!) 105/93, pulse 92, temperature 97.7 ?F (36.5 ?C), temperature source Bladder,  resp. rate (!) 30, height 5\' 3"  (1.6 m), weight 123.4 kg, SpO2 96 %. ?CVP:  [10 mmHg-13 mmHg] 13 mmHg  ?Vent Mode: PRVC ?FiO2 (%):  [70 %-100 %] 70 % ?Set Rate:  [24 bmp-30 bmp] 30 bmp ?Vt Set:  [320 mL-400 mL] 320 mL ?PEEP:  [12 cmH20-16 cmH20] 12 cmH20 ?Plateau Pressure:  [21 cmH20-23 cmH20] 23 cmH20  ? ?Intake/Output Summary (Last 24 hours) at 05/20/2021 0753 ?Last data filed at 05/20/2021 0700 ?Gross per 24 hour  ?Intake 3906.09 ml  ?Output 4975 ml  ?Net -1068.91 ml  ? ?Filed Weights  ? 05/17/21 1300 05/19/21 0400 05/20/21 0333  ?Weight: 94 kg 119.1 kg 123.4 kg  ? ? ?Examination: ?General: Ill-appearing woman, sedated paralyzed and intubated ?HENT: [place]/AT, sclerae anicteric  ?Lungs: Bronchovesicular breath sounds on the right side ?Cardiovascular: Irregular rhythm, tachycardic, no murmurs rubs or gallops ?Abdomen: Soft, bowel sounds present ?Extremities: 2+ pitting edema bilateral lower extremities, warm and dry ?Neuro: Sedated and paralyzed ? ?Resolved Hospital Problem list   ?Shock liver ? ?Assessment & Plan:  ? ?Cor pulmonale, acute ?Severe RV dysfunction on echocardiogram with pressue volume overload. Plan to wean milrinone today.  ?- Continue epoprostenol  ?- Wean milrinone ?  ?Acute hypoxic and hypercapnic respiratory failure hypercapnia  ?ARDS ?Refractory hypoxia and hypercarbia due to predominantly right-sided airspace disease and possible superimposed pulmonary edema. Asymmetry limits utility of PEEP. FiO2 at 60% today, improved.  Continue lung protective ventilation.  Will discontinue paralytics today and wean sedation as tolerated.  Heparin was started for suspected PE given oxygen requirement was not improving despite aggressive therapies.  Lower extremity ultrasound negative for DVT, heparin transition to subcu for DVT prophylaxis. ?-Poor ECMO candidate given concurrent cardiomyopathy ?-Continue lung protective ventilation targeting plateau pressure less than 30 torr and tidal volume less than 8 mL/kg (6  cc equals 312 mL, 8 cc 416 mL)  ?-Continue diuresis and pulmonary vasodilators ?-Discontinue paralytics ?-Wean sedation as tolerated ? ?Septic versus cardiogenic shock ?Currently requiring low-dose norepinephrine.  GPC on blood culture, yet to be speciated.  However, shock may be largely due to RV dysfunction. ?-Titrate norepinephrine to keep MAP greater than 65. ?  ?Acute encephalopathy ?Currently sedated to RASS -4.  Discontinuing paralytics today and wean sedation as tolerated.. ?-Continue propofol and fentanyl infusions  ?  ?HFrEF ?EF 20%, change from echo 2 years ago.  Possibly sepsis induced myocardial dysfunction. Likely also component of diastolic dysfunction with restrictive filling from RV pressure volume overload. ?-Wean milrinone  ?-Continue to diurese ?  ?Atrial flutter ?-Monitor for tachyarrhythmias ? ? ?Best practice (evaluated daily)  ?Diet: NPO, tube feeds ?Pain/Anxiety/Delirium protocol (if indicated): fentanyl, propofol ?VAP protocol (if indicated): yes ?DVT prophylaxis: SCDs ?GI prophylaxis: PPI ?Glucose control: none ?Mobility: bedrest ?Disposition:ICU ? ?Goals of Care:  ?Last date of multidisciplinary goals of care discussion:5/9 ?Family and staff present: yes ?Summary of discussion: Continue current level of care ?Follow up goals of care discussion due: 5/10 ?Code Status: DNR ? ?Labs   ?CBC: ?Recent Labs  ?Lab 05/16/21 ?2130 05/17/21 ?[id number] 05/17/21 ?0355 05/17/21 ?1337 05/18/21 ?0409 05/18/21 ?1228 05/19/21 ?[id number] 05/19/21 ?[id number] 05/19/21 ?0815 05/19/21 ?1141 05/19/21 ?1640 05/20/21 ?0356 05/20/21 ?[id number]  ?WBC 20.3* 22.2* 18.0*  --  13.3*  --  13.7*  --   --   --   --   --  10.9*  ?NEUTROABS 17.6*  --   --   --   --   --   --   --   --   --   --   --   --   ?HGB 14.1 15.4* 14.2   < > 14.9   < > 15.2*   < > 16.7* 16.0* 15.3* 13.9 13.5  ?HCT 47.4* 47.1* 43.2   < > 45.6   < > 48.6*   < > 49.0* 47.0* 45.0 41.0 39.3  ?MCV 108.0* 102.8* 100.5*  --  100.2*  --  103.2*  --   --   --   --   --  95.6  ?PLT  209 226 186  --  169  --  220  --   --   --   --   --  153  ? < > = values in this interval not displayed.  ? ? ?Basic Metabolic Panel: ?Recent Labs  ?Lab 05/16/21 ?2130 05/17/21 ?[id number] 05/17/21 ?0355 05/17/21 ?1337 05/18/21 ?0409 05/18/21 ?1228 05/19/21 ?[id number] 05/19/21 ?[id number] 05/19/21 ?[id number] 05/19/21 ?1141 05/19/21 ?1640 05/20/21 ?0334 05/20/21 ?0356  ?NA 137  --  137   < > 139   < > 134*   < > 136 136 135 136 135  ?K 5.7*  --  4.9   < > 3.9   < > 4.6   < > 4.5 3.4* 3.2* 3.2* 3.1*  ?CL 98  --  101  --  103  --  97*  --   --   --   --  94*  --   ?  CO2 31  --  28  --  29  --  30  --   --   --   --  32  --   ?GLUCOSE 179*  --  101*  --  138*  --  129*  --   --   --   --  152*  --   ?BUN 18  --  19  --  18  --  20  --   --   --   --  18  --   ?CREATININE 1.59*  --  1.59*  --  1.43*  --  1.11*  --   --   --   --  1.02*  --   ?CALCIUM 8.2*  --  8.7*  --  7.9*  --  8.1*  --   --   --   --  7.7*  --   ?MG  --  2.2  --   --  1.8  --   --   --   --   --   --   --   --   ?PHOS  --  3.5  --   --   --   --   --   --   --   --   --   --   --   ? < > = values in this interval not displayed.  ? ?GFR: ?Estimated Creatinine Clearance: 84.2 mL/min (A) (by C-G formula based on SCr of 1.02 mg/dL (H)). ?Recent Labs  ?Lab 05/17/21 ?[id number] 05/17/21 ?[id number] 05/17/21 ?[id number] 05/18/21 ?0409 05/19/21 ?[id number] 05/19/21 ?1024 05/19/21 ?1329 05/20/21 ?[id number]  ?PROCALCITON 0.90  --   --   --   --   --   --   --   ?WBC 22.2* 18.0*  --  13.3* 13.7*  --   --  10.9*  ?LATICACIDVEN  --  2.0* 2.0*  --   --  1.5 1.4  --   ? ? ?Liver Function Tests: ?Recent Labs  ?Lab 05/16/21 ?2130 05/17/21 ?0355 05/19/21 ?[id number] 05/20/21 ?0334  ?AST 48* 46* 25 18  ?ALT 43 37 29 20  ?ALKPHOS 139* 115 111 95  ?BILITOT 1.4* 1.7* 0.7 1.0  ?PROT 6.9 5.3* 5.6* 5.2*  ?ALBUMIN 3.4* 2.6* 2.5* 2.3*  ? ?No results for input(s): LIPASE, AMYLASE in the last 168 hours. ?Recent Labs  ?Lab 05/16/21 ?2242 05/18/21 ?0409  ?AMMONIA 75* 18  ? ? ?ABG ?   ?Component Value Date/Time  ? PHART 7.446 05/20/2021  0356  ? PCO2ART 51.0 (H) 05/20/2021 0356  ? PO2ART 89 05/20/2021 0356  ? HCO3 35.2 (H) 05/20/2021 0356  ? TCO2 37 (H) 05/20/2021 0356  ? O2SAT 97 05/20/2021 0356  ?  ? ?Coagulation Profile: ?Recent

## 2021-05-20 NOTE — Progress Notes (Signed)
ANTICOAGULATION CONSULT NOTE - Follow Up Consult ? ?Pharmacy Consult for Heparin ?Indication: concern of PE ? ?Allergies  ?Allergen Reactions  ? Penicillins Anaphylaxis  ?  Has patient had a PCN reaction causing immediate rash, facial/tongue/throat swelling, SOB or lightheadedness with hypotension: No ?Has patient had a PCN reaction causing severe rash involving mucus membranes or skin necrosis: No ?Has patient had a PCN reaction that required hospitalization No ?Has patient had a PCN reaction occurring within the last 10 years: No ?If all of the above answers are "NO", then may proceed with Cephalosporin use. ?  ? Ibuprofen Nausea And Vomiting  ?  GI upset, Shakes  ? Peppermint Flavor [Flavoring Agent]   ?  Allergic to pepper the spice  ? Aspirin Nausea And Vomiting  ?  Gi upset  ? ? ?Patient Measurements: ?Height: 5\' 3"  (160 cm) ?Weight: 123.4 kg (272 lb 0.8 oz) ?IBW/kg (Calculated) : 52.4 ?Heparin Dosing Weight: 74.1 kg  ? ?Vital Signs: ?Temp: 98.2 ?F (36.8 ?C) (05/09 04-15-1988) ?Temp Source: Bladder (05/09 04-15-1988) ?Pulse Rate: 92 (05/09 0600) ? ?Labs: ?Recent Labs  ?  05/18/21 ?0409 05/18/21 ?0623 05/18/21 ?1228 05/18/21 ?1400 05/18/21 ?1407 05/19/21 ?07/19/21 05/19/21 ?07/19/21 05/19/21 ?1640 05/20/21 ?0334 05/20/21 ?0356 05/20/21 ?07/20/21  ?HGB 14.9  --    < >  --    < > 15.2*   < > 15.3*  --  13.9 13.5  ?HCT 45.6  --    < >  --    < > 48.6*   < > 45.0  --  41.0 39.3  ?PLT 169  --   --   --   --  220  --   --   --   --  153  ?HEPARINUNFRC  --  0.30  --  0.46  --  0.47  --   --  0.35  --   --   ?CREATININE 1.43*  --   --   --   --  1.11*  --   --  1.02*  --   --   ?TROPONINIHS 19* 21*  --   --   --   --   --   --   --   --   --   ? < > = values in this interval not displayed.  ? ? ? ?Estimated Creatinine Clearance: 84.2 mL/min (A) (by C-G formula based on SCr of 1.02 mg/dL (H)). ? ? ?Medical History: ?Past Medical History:  ?Diagnosis Date  ? Anxiety   ? Back pain   ? Chronic back pain 02/04/13  ? By patient report  ? Depression    ? Factor 5 Leiden mutation, heterozygous ([facility])   ? Hypertension   ? Laryngitis, chronic   ? Panic attack   ? Scoliosis   ? Ulcer   ? ? ? ?Assessment: ?51 yo female admitted on 05/16/2021. Pharmacy consulted for dose heparin for concern of PE given vent settings in relation to imaging. No d-dimer. No anticoagulation prior to admission. SQH last given at 1315 on 5/6. LE doppler negative for DVT thought portions of exam were limited.  ? ?Heparin level of 0.35 is therapeutic on heparin 1600 units/hr. Hgb 13.5 - decrease. Plt wnl. Slight pink secretions noted overnight on 5/6 but resolved on 5/7 daytime, now with minimal bleeding with suctioning. No other signs of bleeding noted per RN. No issues with IV infusion or access.   ? ? ?Goal of Therapy:  ?Heparin level 0.3-0.7 units/ml ?Monitor platelets by  anticoagulation protocol: Yes ?  ?Plan:  ?Continue heparin 1600 units/hr  ?Monitor heparin level, CBC and s/s of bleeding daily  ?Follow up PE work up  ? ?Gerrit Halls, PharmD, BCPS ?Clinical Pharmacist ?05/20/2021 7:11 AM ? ? ? ?

## 2021-05-20 NOTE — Progress Notes (Signed)
Veletri syringe changed. ?

## 2021-05-20 NOTE — Progress Notes (Signed)
eLink Physician-Brief Progress Note ?Patient Name: [name] ?DOB: [birth year] ?MRN: [id number] ? ? ?Date of Service ? 05/20/2021  ?HPI/Events of Note ? Ionized calcium 1.07.  ?eICU Interventions ? Calcium gluconate 1 gm iv bolus x 1.  ? ? ? ?  ? ?Thomasene Lot Tekia [place] ?05/20/2021, 5:35 AM ?

## 2021-05-20 NOTE — Progress Notes (Signed)
[facility] ADULT ICU REPLACEMENT PROTOCOL ? ? ?The patient does apply for the [facility] Adult ICU Electrolyte Replacment Protocol based on the criteria listed below:  ? ?1.Exclusion criteria: TCTS patients, ECMO patients, and Dialysis patients ?2. Is GFR >/= 30 ml/min? Yes.    ?Patient's GFR today is >60 ?3. Is SCr </= 2? Yes.   ?Patient's SCr is 1.02 mg/dL ?4. Did SCr increase >/= 0.5 in 24 hours? No. ?5.Pt's weight >40kg  Yes.   ?6. Abnormal electrolyte(s):   K 3.2  ?7. Electrolytes replaced per protocol ?8.  Call MD STAT for K+ </= 2.5, Phos </= 1, or Mag </= 1 ?Physician:  Lona Kettle ? ?[name] 05/20/2021 5:29 AM ? ?

## 2021-05-20 NOTE — Progress Notes (Signed)
eLink Physician-Brief Progress Note ?Patient Name: [name] ?DOB: July 03, 1970 ?MRN: [id number] ? ? ?Date of Service ? 05/20/2021  ?HPI/Events of Note ? CVP  remains high and urine output has tapered off.  ?eICU Interventions ? Bumex 1 mg ix ordered.  ? ? ? ?  ? ?Thomasene Lot [name] ?05/20/2021, 11:56 PM ?

## 2021-05-20 NOTE — Progress Notes (Signed)
Ventilator expiratory filters changed ?

## 2021-05-20 NOTE — Progress Notes (Signed)
ETT advanced 2 cm and secured at 25 cm at the lip. ?

## 2021-05-20 NOTE — Progress Notes (Signed)
Inspiratory and Expiratory filters on ventilator changed. ?

## 2021-05-20 NOTE — Progress Notes (Signed)
eLink Physician-Brief Progress Note ?Patient Name: [name] ?DOB: 06/05/1970 ?MRN: [id number] ? ? ?Date of Service ? 05/20/2021  ?HPI/Events of Note ? CVP 22, patient received 80 mg of iv Lasix at 5 pm and put out 300 ml of urine over the last hour.  ?eICU Interventions ? Will continue to monitor as long as urine output remains high, will trend electrolytes.  ? ? ? ?  ? ?[name] ?05/20/2021, 8:10 PM ?

## 2021-05-20 NOTE — Progress Notes (Signed)
Brief Nutrition Note ? ?Discussed pt with RN and during ICU rounds. Initial Nutrition Assessment completed by this RD yesterday, 05/19/21. Pt tolerating Vital 1.5 tube feeds at trickle rate via post-pyloric Cortrak. Per CCM MD, okay to advance tube feeds to goal. RD to adjust orders. ? ?Tube feeding via post-pyloric Cortrak: ?- Increase rate of Vital 1.5 to 30 ml/hr then continue to advance by 10 ml q 6 hours to goal rate of 55 ml/hr (1320 ml/day) ?- ProSource TF 45 ml BID ? ?Tube feeding regimen at goal rate provides 2060 kcal, 111 grams of protein, and 1008 ml of H2O.  ? ? ?Danielle Clause, MS, RD, LDN ?Inpatient Clinical Dietitian ?Please see AMiON for contact information. ? ?

## 2021-05-21 DIAGNOSIS — J80 Acute respiratory distress syndrome: Secondary | ICD-10-CM | POA: Diagnosis not present

## 2021-05-21 DIAGNOSIS — A419 Sepsis, unspecified organism: Secondary | ICD-10-CM | POA: Diagnosis not present

## 2021-05-21 LAB — CULTURE, BLOOD (ROUTINE X 2)
Culture: NO GROWTH
Special Requests: ADEQUATE

## 2021-05-21 LAB — CBC
HCT: 40.2 % (ref 36.0–46.0)
Hemoglobin: 13.4 g/dL (ref 12.0–15.0)
MCH: 32.5 pg (ref 26.0–34.0)
MCHC: 33.3 g/dL (ref 30.0–36.0)
MCV: 97.6 fL (ref 80.0–100.0)
Platelets: 157 10*3/uL (ref 150–400)
RBC: 4.12 MIL/uL (ref 3.87–5.11)
RDW: 15.5 % (ref 11.5–15.5)
WBC: 9.6 10*3/uL (ref 4.0–10.5)
nRBC: 0 % (ref 0.0–0.2)

## 2021-05-21 LAB — GLUCOSE, CAPILLARY
Glucose-Capillary: 120 mg/dL — ABNORMAL HIGH (ref 70–99)
Glucose-Capillary: 135 mg/dL — ABNORMAL HIGH (ref 70–99)
Glucose-Capillary: 143 mg/dL — ABNORMAL HIGH (ref 70–99)
Glucose-Capillary: 150 mg/dL — ABNORMAL HIGH (ref 70–99)
Glucose-Capillary: 162 mg/dL — ABNORMAL HIGH (ref 70–99)
Glucose-Capillary: 165 mg/dL — ABNORMAL HIGH (ref 70–99)

## 2021-05-21 LAB — COMPREHENSIVE METABOLIC PANEL
ALT: 18 U/L (ref 0–44)
AST: 17 U/L (ref 15–41)
Albumin: 2.2 g/dL — ABNORMAL LOW (ref 3.5–5.0)
Alkaline Phosphatase: 117 U/L (ref 38–126)
Anion gap: 10 (ref 5–15)
BUN: 13 mg/dL (ref 6–20)
CO2: 33 mmol/L — ABNORMAL HIGH (ref 22–32)
Calcium: 8.1 mg/dL — ABNORMAL LOW (ref 8.9–10.3)
Chloride: 94 mmol/L — ABNORMAL LOW (ref 98–111)
Creatinine, Ser: 0.89 mg/dL (ref 0.44–1.00)
GFR, Estimated: >60 mL/min (ref >60)
Glucose, Bld: 163 mg/dL — ABNORMAL HIGH (ref 70–99)
Potassium: 3.7 mmol/L (ref 3.5–5.1)
Sodium: 137 mmol/L (ref 135–145)
Total Bilirubin: 0.7 mg/dL (ref 0.3–1.2)
Total Protein: 5.4 g/dL — ABNORMAL LOW (ref 6.5–8.1)

## 2021-05-21 LAB — POCT I-STAT 7, (LYTES, BLD GAS, ICA,H+H)
Acid-Base Excess: 11 mmol/L — ABNORMAL HIGH (ref 0.0–2.0)
Bicarbonate: 35.8 mmol/L — ABNORMAL HIGH (ref 20.0–28.0)
Calcium, Ion: 1.09 mmol/L — ABNORMAL LOW (ref 1.15–1.40)
HCT: 41 % (ref 36.0–46.0)
Hemoglobin: 13.9 g/dL (ref 12.0–15.0)
O2 Saturation: 96 %
Patient temperature: 99.1
Potassium: 3.7 mmol/L (ref 3.5–5.1)
Sodium: 135 mmol/L (ref 135–145)
TCO2: 37 mmol/L — ABNORMAL HIGH (ref 22–32)
pCO2 arterial: 47.6 mmHg (ref 32–48)
pH, Arterial: 7.485 — ABNORMAL HIGH (ref 7.35–7.45)
pO2, Arterial: 77 mmHg — ABNORMAL LOW (ref 83–108)

## 2021-05-21 LAB — COOXEMETRY PANEL
Carboxyhemoglobin: 1.6 % — ABNORMAL HIGH (ref 0.5–1.5)
Methemoglobin: 0.7 % (ref 0.0–1.5)
O2 Saturation: 99.3 %
Total hemoglobin: 13.9 g/dL (ref 12.0–16.0)

## 2021-05-21 LAB — CULTURE, RESPIRATORY W GRAM STAIN

## 2021-05-21 LAB — BASIC METABOLIC PANEL
Anion gap: 6 (ref 5–15)
BUN: 14 mg/dL (ref 6–20)
CO2: 32 mmol/L (ref 22–32)
Calcium: 7.8 mg/dL — ABNORMAL LOW (ref 8.9–10.3)
Chloride: 99 mmol/L (ref 98–111)
Creatinine, Ser: 0.82 mg/dL (ref 0.44–1.00)
GFR, Estimated: >60 mL/min (ref >60)
Glucose, Bld: 141 mg/dL — ABNORMAL HIGH (ref 70–99)
Potassium: 3.8 mmol/L (ref 3.5–5.1)
Sodium: 137 mmol/L (ref 135–145)

## 2021-05-21 LAB — MAGNESIUM
Magnesium: 1.8 mg/dL (ref 1.7–2.4)
Magnesium: 2.1 mg/dL (ref 1.7–2.4)

## 2021-05-21 LAB — VANCOMYCIN, PEAK: Vancomycin Pk: 33 ug/mL (ref 30–40)

## 2021-05-21 MED ORDER — MAGNESIUM SULFATE 2 GM/50ML IV SOLN
2.0000 g | Freq: Once | INTRAVENOUS | Status: AC
  Administered 2021-05-21: 2 g via INTRAVENOUS
  Filled 2021-05-21: qty 50

## 2021-05-21 MED ORDER — POTASSIUM CHLORIDE 20 MEQ PO PACK
40.0000 meq | PACK | Freq: Three times a day (TID) | ORAL | Status: AC
Start: 2021-05-21 — End: 2021-05-21
  Administered 2021-05-21 (×3): 40 meq
  Filled 2021-05-21 (×3): qty 2

## 2021-05-21 NOTE — Progress Notes (Addendum)
? ?NAME:  [name], MRN:  [id number], DOB:  08-02-1970, LOS: 5 ?ADMISSION DATE:  05/16/2021, CONSULTATION DATE:  5/5 ?REFERRING MD:  Dr. Hyacinth Meeker CHIEF COMPLAINT:  ARF  ? ?History of Present Illness:  ?Patient is a 51 year old female with pertinent PMH of anxiety, depression, chronic pain syndrome on multiple psych meds presents to [facility] on 5/5 with acute respiratory failure. ? ?Patient recently admitted 02/14/2021 with similar episode of respiratory failure with sepsis and aspiration pneumonia and patient was intubated.  Patient left AMA on 02/21/2021.  Patient at home takes multiple psych meds: Xanax, amitriptyline, Lyrica, Trintellix, Ambien, citalopram. ?  ?On 5/5, family went to see patient at home and found unresponsive on floor.  EMS called and patient was unresponsive and sats in 70s.  Patient bagged and transported to [facility] ED.  On arrival to [facility] ED patient GCS very low.  Breathing spontaneously but requiring BVM.  Intubated for hypercarbic hypoxic respiratory failure. ? ?Past Medical History:  ? ? Anxiety    ? Back pain    ? Chronic back pain 02/04/13  ?  By patient report  ? Depression    ? Factor 5 Leiden mutation, heterozygous (HCC)    ? Hypertension    ? Laryngitis, chronic    ? Panic attack    ? Scoliosis    ? Ulcer   ? ? ?Significant Hospital Events:  ?5/5 admitted to [facility] intubated for resp failure; transferring to [facility]  ?5/6 slight hypoxia this a.m. on vent requiring increase of PEEP from 5-8 and up titration of FiO2 from 70-100 ?5/7 notified of positive blood cultures for night, antibiotic regiment remains seen with broad coverage.  She is more alert and oriented this a.m. ?5/8 severe LV and RV dysfunction, LVEF 20%.  Persistent hypoxia and hypercarbia.  ?5/9 weaned off paralytics  ? ? ?Antimicrobials:  ?Vancomycin 5/5> ? ?Interim History / Subjective:  ?Overnight CVP remained high with minimal UOP, given a dose of bumex 1 mg. Remains intubated on sedation this am.  ? ?Objective   ?Blood pressure (!)  105/93, pulse (!) 113, temperature 98.8 ?F (37.1 ?C), resp. rate (!) 30, height 5\' 3"  (1.6 m), weight 123.4 kg, SpO2 98 %. ?CVP:  [13 mmHg-24 mmHg] 21 mmHg  ?Vent Mode: PRVC ?FiO2 (%):  [55 %-60 %] 55 % ?Set Rate:  [30 bmp] 30 bmp ?Vt Set:  [320 mL-324 mL] 320 mL ?PEEP:  [12 cmH20] 12 cmH20 ?Plateau Pressure:  [23 cmH20-25 cmH20] 23 cmH20  ? ?Intake/Output Summary (Last 24 hours) at 05/21/2021 0857 ?Last data filed at 05/21/2021 0700 ?Gross per 24 hour  ?Intake 3593.65 ml  ?Output 5145 ml  ?Net -1551.35 ml  ? ?Filed Weights  ? 05/17/21 1300 05/19/21 0400 05/20/21 0333  ?Weight: 94 kg 119.1 kg 123.4 kg  ? ? ?Examination: ?General: Ill-appearing woman, sedated paralyzed and intubated ?HENT: [place]/AT, sclerae anicteric  ?Lungs: Bronchovesicular breath sounds on the right side ?Cardiovascular: RRR, no murmurs rubs or gallops ?Abdomen: Soft, bowel sounds present ?Extremities: 1+ pitting edema bilateral lower extremities, warm and dry ?Neuro: on sedation, wakes to painful stimuli, does not follow simple commands ? ?Resolved Hospital Problem list   ?Shock liver ? ?Assessment & Plan:  ? ?Cor pulmonale, acute ?Severe RV dysfunction on echocardiogram with pressue volume overload. Weaned off milrinone yesterday. Plan to wean epoprostenol today. ?- Wean epoprostenol  ?  ?Acute hypoxic and hypercapnic respiratory failure hypercapnia  ?ARDS ?Refractory hypoxia and hypercarbia due to predominantly right-sided airspace disease and possible  superimposed pulmonary edema. Asymmetry limits utility of PEEP. FiO2 at 60% today, improved.  Continue lung protective ventilation. Paralytics weaned off yesterday, plan to wean sedation today. ?-Continue lung protective ventilation targeting plateau pressure less than 30 torr and tidal volume less than 8 mL/kg (6 cc equals 312 mL, 8 cc 416 mL)  ?-Continue diuresis  ?-Wean pulmonary vasodilators ?-Wean sedation as tolerated ? ?Septic versus cardiogenic shock ?Currently requiring low-dose  norepinephrine.  GPC on blood culture, yet to be speciated.  However, shock may be largely due to RV dysfunction. ?-Titrate norepinephrine to keep MAP greater than 65. ?  ?Acute encephalopathy ?On propofol and fentanyl. RASS goal -3 to -2. Discontinued paralytics and weaning sedation as tolerated. ?-Continue propofol and fentanyl infusions  ?  ?HFrEF ?EF 20%, change from echo 2 years ago.  Possibly sepsis induced myocardial dysfunction. Likely also component of diastolic dysfunction with restrictive filling from RV pressure volume overload. Milnirone weaned off. ?-Continue to diurese ?  ?Atrial flutter ?Amiodarone stopped. ?-Monitor for tachyarrhythmias ? ? ?Best practice (evaluated daily)  ?Diet: NPO, tube feeds ?Pain/Anxiety/Delirium protocol (if indicated): fentanyl, propofol ?VAP protocol (if indicated): yes ?DVT prophylaxis: SCDs ?GI prophylaxis: PPI ?Glucose control: none ?Mobility: bedrest ?Disposition:ICU ? ?Goals of Care:  ?Last date of multidisciplinary goals of care discussion:5/10 ?Family and staff present: yes ?Summary of discussion: wean sedation as tolerated  ?Follow up goals of care discussion due: 5/11 ?Code Status: DNR ? ?Labs   ?CBC: ?Recent Labs  ?Lab 05/16/21 ?2130 05/17/21 ?7001 05/17/21 ?0355 05/17/21 ?1337 05/18/21 ?0409 05/18/21 ?1228 05/19/21 ?7494 05/19/21 ?4967 05/19/21 ?1640 05/20/21 ?0356 05/20/21 ?5916 05/21/21 ?3846 05/21/21 ?6599  ?WBC 20.3*   < > 18.0*  --  13.3*  --  13.7*  --   --   --  10.9*  --  9.6  ?NEUTROABS 17.6*  --   --   --   --   --   --   --   --   --   --   --   --   ?HGB 14.1   < > 14.2   < > 14.9   < > 15.2*   < > 15.3* 13.9 13.5 13.9 13.4  ?HCT 47.4*   < > 43.2   < > 45.6   < > 48.6*   < > 45.0 41.0 39.3 41.0 40.2  ?MCV 108.0*   < > 100.5*  --  100.2*  --  103.2*  --   --   --  95.6  --  97.6  ?PLT 209   < > 186  --  169  --  220  --   --   --  153  --  157  ? < > = values in this interval not displayed.  ? ? ?Basic Metabolic Panel: ?Recent Labs  ?Lab 05/17/21 ?3570  05/17/21 ?0355 05/18/21 ?0409 05/18/21 ?1228 05/19/21 ?1779 05/19/21 ?3903 05/20/21 ?0092 05/20/21 ?0356 05/20/21 ?1205 05/21/21 ?0116 05/21/21 ?3300 05/21/21 ?7622  ?NA  --    < > 139   < > 134*   < > 136 135 136 137 135 137  ?K  --    < > 3.9   < > 4.6   < > 3.2* 3.1* 3.6 3.8 3.7 3.7  ?CL  --    < > 103  --  97*  --  94*  --  95* 99  --  94*  ?CO2  --    < > 29  --  30  --  32  --  31 32  --  33*  ?GLUCOSE  --    < > 138*  --  129*  --  152*  --  147* 141*  --  163*  ?BUN  --    < > 18  --  20  --  18  --  14 14  --  13  ?CREATININE  --    < > 1.43*  --  1.11*  --  1.02*  --  0.97 0.82  --  0.89  ?CALCIUM  --    < > 7.9*  --  8.1*  --  7.7*  --  8.1* 7.8*  --  8.1*  ?MG 2.2  --  1.8  --   --   --  1.5*  --   --  1.8  --  2.1  ?PHOS 3.5  --   --   --   --   --   --   --   --   --   --   --   ? < > = values in this interval not displayed.  ? ?GFR: ?Estimated Creatinine Clearance: 96.5 mL/min (by C-G formula based on SCr of 0.89 mg/dL). ?Recent Labs  ?Lab 05/17/21 ?7116 05/17/21 ?5790 05/17/21 ?3833 05/18/21 ?0409 05/19/21 ?3832 05/19/21 ?1024 05/19/21 ?1329 05/20/21 ?9191 05/21/21 ?6606  ?PROCALCITON 0.90  --   --   --   --   --   --   --   --   ?WBC 22.2* 18.0*  --  13.3* 13.7*  --   --  10.9* 9.6  ?LATICACIDVEN  --  2.0* 2.0*  --   --  1.5 1.4  --   --   ? ? ?Liver Function Tests: ?Recent Labs  ?Lab 05/16/21 ?2130 05/17/21 ?0355 05/19/21 ?0045 05/20/21 ?9977 05/21/21 ?4142  ?AST 48* 46* 25 18 17   ?ALT 43 37 29 20 18   ?ALKPHOS 139* 115 111 95 117  ?BILITOT 1.4* 1.7* 0.7 1.0 0.7  ?PROT 6.9 5.3* 5.6* 5.2* 5.4*  ?ALBUMIN 3.4* 2.6* 2.5* 2.3* 2.2*  ? ?No results for input(s): LIPASE, AMYLASE in the last 168 hours. ?Recent Labs  ?Lab 05/16/21 ?2242 05/18/21 ?0409  ?AMMONIA 75* 18  ? ? ?ABG ?   ?Component Value Date/Time  ? PHART 7.485 (H) 05/21/2021 0341  ? PCO2ART 47.6 05/21/2021 0341  ? PO2ART 77 (L) 05/21/2021 0341  ? HCO3 35.8 (H) 05/21/2021 0341  ? TCO2 37 (H) 05/21/2021 0341  ? O2SAT 96 05/21/2021 0341  ?   ? ?Coagulation Profile: ?Recent Labs  ?Lab 05/16/21 ?2130  ?INR 1.5*  ? ? ?Cardiac Enzymes: ?No results for input(s): CKTOTAL, CKMB, CKMBINDEX, TROPONINI in the last 168 hours. ? ?HbA1C: ?Hgb A1c MFr Bld  ?Date/Time Value Re

## 2021-05-21 NOTE — Plan of Care (Signed)

## 2021-05-21 NOTE — Progress Notes (Signed)
[facility] ADULT ICU REPLACEMENT PROTOCOL ? ? ?The patient does apply for the [facility] Adult ICU Electrolyte Replacment Protocol based on the criteria listed below:  ? ?1.Exclusion criteria: TCTS patients, ECMO patients, and Dialysis patients ?2. Is GFR >/= 30 ml/min? Yes.    ?Patient's GFR today is >60 ?3. Is SCr </= 2? Yes.   ?Patient's SCr is 0.82 mg/dL ?4. Did SCr increase >/= 0.5 in 24 hours? No. ?5.Pt's weight >40kg  Yes.   ?6. Abnormal electrolyte(s):   Mg 1.8  ?7. Electrolytes replaced per protocol ?8.  Call MD STAT for K+ </= 2.5, Phos </= 1, or Mag </= 1 ?Physician:  Lona Kettle ? ?[name] 05/21/2021 2:20 AM ? ?

## 2021-05-21 NOTE — Progress Notes (Signed)
Nutrition Follow-up ? ?DOCUMENTATION CODES:  ? ?Not applicable ? ?INTERVENTION:  ? ?Continue tube feeds via post-pyloric Cortrak: ?- Vital 1.5 @ 55 ml/hr (1320 ml/day) ?- ProSource TF 45 ml BID ? ?Tube feeding regimen provides 2060 kcal, 111 grams of protein, and 1008 ml of H2O.  ? ?NUTRITION DIAGNOSIS:  ? ?Inadequate oral intake related to inability to eat as evidenced by NPO status. ? ?Ongoing, being addressed via TF ? ?GOAL:  ? ?Patient will meet greater than or equal to 90% of their needs ? ?Met via TF ? ?MONITOR:  ? ?Vent status, Labs, Weight trends, TF tolerance, I & O's ? ?REASON FOR ASSESSMENT:  ? ?Ventilator, Consult ?Enteral/tube feeding initiation and management (trickle tube feeds) ? ?ASSESSMENT:  ? ?51 year old female who presented to the ED on 5/05 after being found unresponsive. Pt required intubation in the ED. PMH of anxiety, depression, chronic pain syndrome, HTN, GERD, polysubstance abuse. Pt admitted with hypercapnic and hypoxemic respiratory failure, shock in the setting of gram-positive bacteremia with possible mixed cardiogenic shock, acute encephalopathy, ARDS, severe systolic and diastolic CHF. ? ?05/07 - pt started on paralytics ?05/08 - Cortrak placed (tip post-pyloric) ? ?Discussed pt with RN and during ICU rounds. Pt tolerating tube feeds at goal rate. No BM yet but pt on bowel regimen with active bowel sounds per RN. ? ?Current TF: Vital 1.5 @ 55 ml/hr, ProSource TF 45 ml BID ? ?Admit weight: 94 kg ?Current weight: 123.4 kg ? ?Weight up significantly. Pt with moderate pitting edema to BUE and BLE. ? ?Patient is currently intubated on ventilator support ?MV: 9.5 L/min ?Temp (24hrs), Avg:99.2 ?F (37.3 ?C), Min:98.8 ?F (37.1 ?C), Max:99.4 ?F (37.4 ?C) ? ?Drips: ?Propofol: 22.6 ml/hr (provides 597 kcal daily from lipid) ?Fentanyl ?Levophed ? ?Medications reviewed and include: colace, IV lasix, SSI q 4 hours, lactulose 10 grams TID, protonix, miralax, klor-con 40 mEq TID, senna, sucralfate 1  gram BID, veletri for inhalation, IV abx ? ?Labs reviewed: ionized calcium 1.09 ?CBG's: 150-165 x 24 hours ? ?UOP: 5015 ml x 24 hours ?I/O's: +256 ml since admit ? ?NUTRITION - FOCUSED PHYSICAL EXAM: ? ?Flowsheet Row Most Recent Value  ?Orbital Region No depletion  ?Upper Arm Region No depletion  ?Thoracic and Lumbar Region No depletion  ?Buccal Region Unable to assess  ?Temple Region No depletion  ?Clavicle Bone Region No depletion  ?Clavicle and Acromion Bone Region No depletion  ?Scapular Bone Region Unable to assess  ?Dorsal Hand No depletion  ?Patellar Region No depletion  ?Anterior Thigh Region No depletion  ?Posterior Calf Region No depletion  ?Edema (RD Assessment) Moderate  ?Hair Reviewed  ?Eyes Reviewed  ?Mouth Reviewed  ?Skin Reviewed  ?Nails Reviewed  ? ?  ? ? ?Diet Order:   ?Diet Order   ? ?       ?  Diet NPO time specified  Diet effective now       ?  ? ?  ?  ? ?  ? ? ?EDUCATION NEEDS:  ? ?No education needs have been identified at this time ? ?Skin:  Skin Assessment: Reviewed RN Assessment ? ?Last BM:  no documented BM ? ?Height:  ? ?Ht Readings from Last 1 Encounters:  ?05/17/21 5' 3"  (1.6 m)  ? ? ?Weight:  ? ?Wt Readings from Last 1 Encounters:  ?05/20/21 123.4 kg  ? ? ?Ideal Body Weight:  52.3 kg ? ?BMI:  Body mass index is 48.19 kg/m?. ? ?Estimated Nutritional Needs:  ? ?Kcal:  2000-2200 ? ?  Protein:  105-120 grams ? ?Fluid:  >1.8 L ? ? ? ?Gustavus Bryant, MS, RD, LDN ?Inpatient Clinical Dietitian ?Please see AMiON for contact information. ? ?

## 2021-05-22 ENCOUNTER — Inpatient Hospital Stay: Payer: Self-pay

## 2021-05-22 ENCOUNTER — Inpatient Hospital Stay (HOSPITAL_COMMUNITY): Payer: Medicaid Other

## 2021-05-22 DIAGNOSIS — J9690 Respiratory failure, unspecified, unspecified whether with hypoxia or hypercapnia: Secondary | ICD-10-CM | POA: Diagnosis not present

## 2021-05-22 DIAGNOSIS — A419 Sepsis, unspecified organism: Secondary | ICD-10-CM | POA: Diagnosis not present

## 2021-05-22 DIAGNOSIS — J80 Acute respiratory distress syndrome: Secondary | ICD-10-CM | POA: Diagnosis not present

## 2021-05-22 LAB — POCT I-STAT 7, (LYTES, BLD GAS, ICA,H+H)
Acid-Base Excess: 11 mmol/L — ABNORMAL HIGH (ref 0.0–2.0)
Acid-Base Excess: 8 mmol/L — ABNORMAL HIGH (ref 0.0–2.0)
Acid-Base Excess: 8 mmol/L — ABNORMAL HIGH (ref 0.0–2.0)
Bicarbonate: 36 mmol/L — ABNORMAL HIGH (ref 20.0–28.0)
Bicarbonate: 38.7 mmol/L — ABNORMAL HIGH (ref 20.0–28.0)
Bicarbonate: 37.8 mmol/L — ABNORMAL HIGH (ref 20.0–28.0)
Calcium, Ion: 1.11 mmol/L — ABNORMAL LOW (ref 1.15–1.40)
Calcium, Ion: 1.15 mmol/L (ref 1.15–1.40)
Calcium, Ion: 1.14 mmol/L — ABNORMAL LOW (ref 1.15–1.40)
HCT: 41 % (ref 36.0–46.0)
HCT: 43 % (ref 36.0–46.0)
HCT: 41 % (ref 36.0–46.0)
Hemoglobin: 13.9 g/dL (ref 12.0–15.0)
Hemoglobin: 14.6 g/dL (ref 12.0–15.0)
Hemoglobin: 13.9 g/dL (ref 12.0–15.0)
O2 Saturation: 93 %
O2 Saturation: 98 %
O2 Saturation: 95 %
Patient temperature: 100.4
Patient temperature: 99
Patient temperature: 36.2
Potassium: 3.8 mmol/L (ref 3.5–5.1)
Potassium: 4.5 mmol/L (ref 3.5–5.1)
Potassium: 4.6 mmol/L (ref 3.5–5.1)
Sodium: 136 mmol/L (ref 135–145)
Sodium: 138 mmol/L (ref 135–145)
Sodium: 138 mmol/L (ref 135–145)
TCO2: 37 mmol/L — ABNORMAL HIGH (ref 22–32)
TCO2: 41 mmol/L — ABNORMAL HIGH (ref 22–32)
TCO2: 40 mmol/L — ABNORMAL HIGH (ref 22–32)
pCO2 arterial: 50.5 mmHg — ABNORMAL HIGH (ref 32–48)
pCO2 arterial: 85.5 mmHg (ref 32–48)
pCO2 arterial: 73.2 mmHg (ref 32–48)
pH, Arterial: 7.465 — ABNORMAL HIGH (ref 7.35–7.45)
pH, Arterial: 7.265 — ABNORMAL LOW (ref 7.35–7.45)
pH, Arterial: 7.317 — ABNORMAL LOW (ref 7.35–7.45)
pO2, Arterial: 68 mmHg — ABNORMAL LOW (ref 83–108)
pO2, Arterial: 122 mmHg — ABNORMAL HIGH (ref 83–108)
pO2, Arterial: 83 mmHg (ref 83–108)

## 2021-05-22 LAB — GLUCOSE, CAPILLARY
Glucose-Capillary: 127 mg/dL — ABNORMAL HIGH (ref 70–99)
Glucose-Capillary: 134 mg/dL — ABNORMAL HIGH (ref 70–99)
Glucose-Capillary: 140 mg/dL — ABNORMAL HIGH (ref 70–99)
Glucose-Capillary: 145 mg/dL — ABNORMAL HIGH (ref 70–99)
Glucose-Capillary: 149 mg/dL — ABNORMAL HIGH (ref 70–99)
Glucose-Capillary: 169 mg/dL — ABNORMAL HIGH (ref 70–99)

## 2021-05-22 LAB — COMPREHENSIVE METABOLIC PANEL
ALT: 16 U/L (ref 0–44)
AST: 17 U/L (ref 15–41)
Albumin: 2.2 g/dL — ABNORMAL LOW (ref 3.5–5.0)
Alkaline Phosphatase: 116 U/L (ref 38–126)
Anion gap: 6 (ref 5–15)
BUN: 14 mg/dL (ref 6–20)
CO2: 33 mmol/L — ABNORMAL HIGH (ref 22–32)
Calcium: 8.2 mg/dL — ABNORMAL LOW (ref 8.9–10.3)
Chloride: 98 mmol/L (ref 98–111)
Creatinine, Ser: 1.01 mg/dL — ABNORMAL HIGH (ref 0.44–1.00)
GFR, Estimated: >60 mL/min (ref >60)
Glucose, Bld: 137 mg/dL — ABNORMAL HIGH (ref 70–99)
Potassium: 4 mmol/L (ref 3.5–5.1)
Sodium: 137 mmol/L (ref 135–145)
Total Bilirubin: 0.7 mg/dL (ref 0.3–1.2)
Total Protein: 5.7 g/dL — ABNORMAL LOW (ref 6.5–8.1)

## 2021-05-22 LAB — CBC
HCT: 40.6 % (ref 36.0–46.0)
Hemoglobin: 13.4 g/dL (ref 12.0–15.0)
MCH: 32.4 pg (ref 26.0–34.0)
MCHC: 33 g/dL (ref 30.0–36.0)
MCV: 98.3 fL (ref 80.0–100.0)
Platelets: 146 10*3/uL — ABNORMAL LOW (ref 150–400)
RBC: 4.13 MIL/uL (ref 3.87–5.11)
RDW: 15.4 % (ref 11.5–15.5)
WBC: 6.8 10*3/uL (ref 4.0–10.5)
nRBC: 0 % (ref 0.0–0.2)

## 2021-05-22 LAB — VANCOMYCIN, TROUGH: Vancomycin Tr: 15 ug/mL (ref 15–20)

## 2021-05-22 LAB — COOXEMETRY PANEL
Carboxyhemoglobin: 1.8 % — ABNORMAL HIGH (ref 0.5–1.5)
Methemoglobin: <0.7 % (ref 0.0–1.5)
O2 Saturation: 96.7 %
Total hemoglobin: 13.7 g/dL (ref 12.0–16.0)

## 2021-05-22 MED ORDER — BUDESONIDE 0.25 MG/2ML IN SUSP
0.2500 mg | Freq: Two times a day (BID) | RESPIRATORY_TRACT | Status: DC
  Administered 2021-05-22 – 2021-06-03 (×19): 0.25 mg via RESPIRATORY_TRACT
  Filled 2021-05-22 (×21): qty 2

## 2021-05-22 MED ORDER — REVEFENACIN 175 MCG/3ML IN SOLN
175.0000 ug | Freq: Every day | RESPIRATORY_TRACT | Status: DC
  Administered 2021-05-22 – 2021-06-03 (×10): 175 ug via RESPIRATORY_TRACT
  Filled 2021-05-22 (×13): qty 3

## 2021-05-22 MED ORDER — MIDAZOLAM-SODIUM CHLORIDE 100-0.9 MG/100ML-% IV SOLN
0.5000 mg/h | INTRAVENOUS | Status: DC
  Administered 2021-05-22: 2 mg/h via INTRAVENOUS
  Filled 2021-05-22: qty 100

## 2021-05-22 MED ORDER — ARFORMOTEROL TARTRATE 15 MCG/2ML IN NEBU
15.0000 ug | INHALATION_SOLUTION | Freq: Two times a day (BID) | RESPIRATORY_TRACT | Status: DC
  Administered 2021-05-22 – 2021-06-03 (×19): 15 ug via RESPIRATORY_TRACT
  Filled 2021-05-22 (×21): qty 2

## 2021-05-22 MED ORDER — POTASSIUM CHLORIDE 20 MEQ PO PACK
40.0000 meq | PACK | Freq: Two times a day (BID) | ORAL | Status: DC
Start: 2021-05-22 — End: 2021-05-26
  Administered 2021-05-22 – 2021-05-26 (×9): 40 meq
  Filled 2021-05-22 (×9): qty 2

## 2021-05-22 MED ORDER — QUETIAPINE FUMARATE 50 MG PO TABS
50.0000 mg | ORAL_TABLET | Freq: Two times a day (BID) | ORAL | Status: AC
  Administered 2021-05-22 – 2021-05-28 (×12): 50 mg
  Filled 2021-05-22 (×12): qty 1

## 2021-05-22 MED ORDER — MIDAZOLAM BOLUS VIA INFUSION
2.0000 mg | INTRAVENOUS | Status: DC | PRN
  Administered 2021-05-23: 2 mg via INTRAVENOUS
  Filled 2021-05-22: qty 2

## 2021-05-22 MED ORDER — LEVOFLOXACIN 750 MG PO TABS
750.0000 mg | ORAL_TABLET | Freq: Every day | ORAL | Status: AC
  Administered 2021-05-22 – 2021-05-26 (×5): 750 mg
  Filled 2021-05-22 (×5): qty 1

## 2021-05-22 MED ORDER — SENNOSIDES-DOCUSATE SODIUM 8.6-50 MG PO TABS
1.0000 | ORAL_TABLET | Freq: Two times a day (BID) | ORAL | Status: DC
  Administered 2021-05-22 – 2021-05-27 (×9): 1
  Filled 2021-05-22 (×11): qty 1

## 2021-05-22 MED ORDER — FENTANYL CITRATE (PF) 2500 MCG/50ML IJ SOLN
0.0000 ug/h | Status: DC
  Administered 2021-05-22: 300 ug/h via INTRAVENOUS
  Administered 2021-05-22 – 2021-05-24 (×4): 400 ug/h via INTRAVENOUS
  Administered 2021-05-24: 375 ug/h via INTRAVENOUS
  Administered 2021-05-24: 300 ug/h via INTRAVENOUS
  Administered 2021-05-25: 210 ug/h via INTRAVENOUS
  Administered 2021-05-26: 250 ug/h via INTRAVENOUS
  Administered 2021-05-27: 200 ug/h via INTRAVENOUS
  Filled 2021-05-22 (×11): qty 100

## 2021-05-22 MED ORDER — FUROSEMIDE 10 MG/ML IJ SOLN
4.0000 mg/h | INTRAVENOUS | Status: DC
  Administered 2021-05-22 – 2021-05-27 (×5): 8 mg/h via INTRAVENOUS
  Administered 2021-05-27: 12 mg/h via INTRAVENOUS
  Administered 2021-05-28: 8 mg/h via INTRAVENOUS
  Administered 2021-05-29: 4 mg/h via INTRAVENOUS
  Filled 2021-05-22 (×8): qty 20

## 2021-05-22 NOTE — Progress Notes (Addendum)
? ?NAME:  [name], MRN:  [id number], DOB:  11-13-1970, LOS: 6 ?ADMISSION DATE:  05/16/2021, CONSULTATION DATE:  5/5 ?REFERRING MD:  Dr. Hyacinth Meeker CHIEF COMPLAINT:  ARF  ? ?History of Present Illness:  ?Patient is a 51 year old female with pertinent PMH of anxiety, depression, chronic pain syndrome on multiple psych meds presents to [facility] on 5/5 with acute respiratory failure. ? ?Patient recently admitted 02/14/2021 with similar episode of respiratory failure with sepsis and aspiration pneumonia and patient was intubated.  Patient left AMA on 02/21/2021.  Patient at home takes multiple psych meds: Xanax, amitriptyline, Lyrica, Trintellix, Ambien, citalopram. ?  ?On 5/5, family went to see patient at home and found unresponsive on floor.  EMS called and patient was unresponsive and sats in 70s.  Patient bagged and transported to [facility] ED.  On arrival to [facility] ED patient GCS very low.  Breathing spontaneously but requiring BVM.  Intubated for hypercarbic hypoxic respiratory failure. ? ?Past Medical History:  ? ? Anxiety    ? Back pain    ? Chronic back pain 02/04/13  ?  By patient report  ? Depression    ? Factor 5 Leiden mutation, heterozygous (HCC)    ? Hypertension    ? Laryngitis, chronic    ? Panic attack    ? Scoliosis    ? Ulcer   ? ? ?Significant Hospital Events:  ?5/5 admitted to [facility] intubated for resp failure; [facility]  ?5/6 slight hypoxia this a.m. on vent requiring increase of PEEP from 5-8 and up titration of FiO2 from 70-100 ?5/7 notified of positive blood cultures for night, antibiotic regiment remains seen with broad coverage.  She is more alert and oriented this a.m. ?5/8 severe LV and RV dysfunction, LVEF 20%.  Persistent hypoxia and hypercarbia.  ?5/9 weaned off paralytics and milnirone  ?5/10 weaned off of epoprostenol  ?5/11 weaned off of levophed  ? ? ?Antimicrobials:  ?Vancomycin 5/5> ? ?Interim History / Subjective:  ?No acute overnight events. Wakes to verbal stimuli this AM, following  simple commands. ? ?Objective   ?Blood pressure 110/60, pulse 68, temperature 99 ?F (37.2 ?C), temperature source Oral, resp. rate (!) 30, height 5\' 3"  (1.6 m), weight 115.7 kg, SpO2 93 %. ?CVP:  [21 mmHg] 21 mmHg  ?Vent Mode: PRVC ?FiO2 (%):  [50 %-60 %] 60 % ?Set Rate:  [30 bmp] 30 bmp ?Vt Set:  [320 mL] 320 mL ?PEEP:  [8 cmH20-10 cmH20] 8 cmH20 ?Plateau Pressure:  [18 cmH20-34 cmH20] 19 cmH20  ? ?Intake/Output Summary (Last 24 hours) at 05/22/2021 0754 ?Last data filed at 05/22/2021 0700 ?Gross per 24 hour  ?Intake 3652.8 ml  ?Output 6075 ml  ?Net -2422.2 ml  ? ?Filed Weights  ? 05/19/21 0400 05/20/21 0333 05/22/21 0437  ?Weight: 119.1 kg 123.4 kg 115.7 kg  ? ? ?Examination: ?General: Ill-appearing woman, NAD, intubated  ?HENT: [place]/AT, sclerae anicteric  ?Lungs: CTA bilaterally, on mechanical ventilation ?Cardiovascular: regular rhythm, tachycardia, no murmurs rubs or gallops ?Abdomen: Soft, bowel sounds present ?Extremities: 2+ pitting edema bilateral lower extremities, warm and dry ?Neuro: opens eyes to verbal stimuli, follows simple commands, moving b/l LE and LUE, no purposeful movement of RUE  ? ?Resolved Hospital Problem list   ?Shock liver ?Atrial flutter ? ?Assessment & Plan:  ? ?Cor pulmonale, acute ?Severe RV dysfunction on echocardiogram with pressue volume overload. Weaned off milrinone yesterday. Successfully weaned off epoprostenol yesterday. Repat POC ultrasound today shows minimal improvement in EF, approximately ~25-30%. RV  still appears overloaded, slightly improved. Will place a PICC line today and check co-ox panel. Will also start lasix gtt. ?-PICC line ?-Follow up co-ox panel ?-Lasix infusion ?  ?Acute hypoxic and hypercapnic respiratory failure hypercapnia  ?ARDS ?Refractory hypoxia and hypercarbia due to predominantly right-sided airspace disease and possible superimposed pulmonary edema. Asymmetry limits utility of PEEP. FiO2 at 60% today.  Continue lung protective ventilation and to wean  sedation as tolerated. ?-Continue lung protective ventilation targeting plateau pressure less than 30 torr and tidal volume less than 8 mL/kg (6 cc equals 312 mL, 8 cc 416 mL)  ?-Continue diuresis  ?-Wean sedation as tolerated ? ?Septic versus cardiogenic shock ?Weaned off of levophed early this AM. BC showed staphylococcus epi and strep. On vanc, will switch to levaquin. ?-Maintain MAP greater than 65. ?  ?Acute encephalopathy ?RASS goal -2, -1 today. She is following simple commands this AM on propofol and fentanyl.  ?-Continue to wean propofol and fentanyl infusions  ?  ?HFrEF ?EF 20%, change from echo 2 years ago.  Possibly sepsis induced myocardial dysfunction. Likely also component of diastolic dysfunction with restrictive filling from RV pressure volume overload. Milnirone weaned off. POC [place] with minimal improvement of EF. ?-Continue to diurese ?  ? ?Best practice (evaluated daily)  ?Diet: NPO, tube feeds ?Pain/Anxiety/Delirium protocol (if indicated): fentanyl, propofol ?VAP protocol (if indicated): yes ?DVT prophylaxis: SCDs ?GI prophylaxis: PPI ?Glucose control: none ?Mobility: bedrest ?Disposition:ICU ? ?Goals of Care:  ?Last date of multidisciplinary goals of care discussion:5/11 ?Family and staff present: yes ?Summary of discussion: wean sedation as tolerated  ?Follow up goals of care discussion due: 5/12 ?Code Status: DNR ? ?Labs   ?CBC: ?Recent Labs  ?Lab 05/16/21 ?2130 05/17/21 ?0272 05/18/21 ?0409 05/18/21 ?1228 05/19/21 ?5366 05/19/21 ?4403 05/20/21 ?4742 05/21/21 ?5956 05/21/21 ?3875 05/22/21 ?0436 05/22/21 ?6433  ?WBC 20.3*   < > 13.3*  --  13.7*  --  10.9*  --  9.6 6.8  --   ?NEUTROABS 17.6*  --   --   --   --   --   --   --   --   --   --   ?HGB 14.1   < > 14.9   < > 15.2*   < > 13.5 13.9 13.4 13.4 13.9  ?HCT 47.4*   < > 45.6   < > 48.6*   < > 39.3 41.0 40.2 40.6 41.0  ?MCV 108.0*   < > 100.2*  --  103.2*  --  95.6  --  97.6 98.3  --   ?PLT 209   < > 169  --  220  --  153  --  157 146*  --   ?  < > = values in this interval not displayed.  ? ? ?Basic Metabolic Panel: ?Recent Labs  ?Lab 05/17/21 ?2951 05/17/21 ?8841 05/18/21 ?6606 05/18/21 ?1228 05/20/21 ?3016 05/20/21 ?0109 05/20/21 ?1205 05/21/21 ?0116 05/21/21 ?3235 05/21/21 ?5732 05/22/21 ?0436 05/22/21 ?2025  ?NA  --    < > 139   < > 136   < > 136 137 135 137 137 136  ?K  --    < > 3.9   < > 3.2*   < > 3.6 3.8 3.7 3.7 4.0 3.8  ?CL  --    < > 103   < > 94*  --  95* 99  --  94* 98  --   ?CO2  --    < > 29   < >  32  --  31 32  --  33* 33*  --   ?GLUCOSE  --    < > 138*   < > 152*  --  147* 141*  --  163* 137*  --   ?BUN  --    < > 18   < > 18  --  14 14  --  13 14  --   ?CREATININE  --    < > 1.43*   < > 1.02*  --  0.97 0.82  --  0.89 1.01*  --   ?CALCIUM  --    < > 7.9*   < > 7.7*  --  8.1* 7.8*  --  8.1* 8.2*  --   ?MG 2.2  --  1.8  --  1.5*  --   --  1.8  --  2.1  --   --   ?PHOS 3.5  --   --   --   --   --   --   --   --   --   --   --   ? < > = values in this interval not displayed.  ? ?GFR: ?Estimated Creatinine Clearance: 81.7 mL/min (A) (by C-G formula based on SCr of 1.01 mg/dL (H)). ?Recent Labs  ?Lab 05/17/21 ?1308 05/17/21 ?6578 05/17/21 ?4696 05/18/21 ?0409 05/19/21 ?2952 05/19/21 ?1024 05/19/21 ?1329 05/20/21 ?8413 05/21/21 ?2440 05/22/21 ?0436  ?PROCALCITON 0.90  --   --   --   --   --   --   --   --   --   ?WBC 22.2* 18.0*  --    < > 13.7*  --   --  10.9* 9.6 6.8  ?LATICACIDVEN  --  2.0* 2.0*  --   --  1.5 1.4  --   --   --   ? < > = values in this interval not displayed.  ? ? ?Liver Function Tests: ?Recent Labs  ?Lab 05/17/21 ?0355 05/19/21 ?1027 05/20/21 ?2536 05/21/21 ?6440 05/22/21 ?0436  ?AST 46* 25 18 17 17   ?ALT 37 29 20 18 16   ?ALKPHOS 115 111 95 117 116  ?BILITOT 1.7* 0.7 1.0 0.7 0.7  ?PROT 5.3* 5.6* 5.2* 5.4* 5.7*  ?ALBUMIN 2.6* 2.5* 2.3* 2.2* 2.2*  ? ?No results for input(s): LIPASE, AMYLASE in the last 168 hours. ?Recent Labs  ?Lab 05/16/21 ?2242 05/18/21 ?0409  ?AMMONIA 75* 18  ? ? ?ABG ?   ?Component Value Date/Time  ? PHART  7.465 (H) 05/22/2021 0449  ? PCO2ART 50.5 (H) 05/22/2021 0449  ? PO2ART 68 (L) 05/22/2021 0449  ? HCO3 36.0 (H) 05/22/2021 0449  ? TCO2 37 (H) 05/22/2021 0449  ? O2SAT 93 05/22/2021 0449  ?  ? ?Coagulation P

## 2021-05-22 NOTE — Progress Notes (Signed)
eLink Physician-Brief Progress Note ?Patient Name: NEELAH MANNINGS ?DOB: 1970/03/08 ?MRN: [id number] ? ? ?Date of Service ? 05/22/2021  ?HPI/Events of Note ? Poor vent synchrony.  Worsening pulm edema.  Resp acidosis.  Elevated peak and plat pressure.   ?Still poor synchrony with sedation.  ?eICU Interventions ? Paralysis which improved MV. ?Repeat abg in 2 hrs ?Place foley  ? ? ? ?Intervention Category ?Major Interventions: Respiratory failure - evaluation and management ? ?Henry Russel, P ?05/22/2021, 8:47 PM ?

## 2021-05-22 NOTE — Progress Notes (Signed)
Pharmacy Antibiotic Note ? ?[name] is a 51 y.o. female admitted on 05/16/2021 with sepsis secondary to Strep pneumo and MSSA PNA.  Noted anaphylaxis to PCN and no cephalosporin use in the past.  Pharmacy has been consulted for vancomycin dosing.  ? ?Vancomycin AUC therapeutic at 549 (goal 400-550) ?AKI resolved - SCr 1.01, Tmax 100.5, WBC WNL. ? ?Plan: ?Continue vanc 750mg  IV Q12H through 5/15 ?Monitor renal fxn, clinical progress ? ?Height: 5\' 3"  (160 cm) ?Weight: 115.7 kg (255 lb 1.2 oz) ?IBW/kg (Calculated) : 52.4 ? ?Temp (24hrs), Avg:99.5 ?F (37.5 ?C), Min:98.4 ?F (36.9 ?C), Max:100.5 ?F (38.1 ?C) ? ?Recent Labs  ?Lab 05/17/21 ? 05/17/21 ?2706 05/17/21 ?2376 05/17/21 ?2831 05/18/21 ?0409 05/19/21 ?07/18/21 05/19/21 ?1024 05/19/21 ?1329 05/20/21 ?0334 05/20/21 ?07/20/21 05/20/21 ?1205 05/21/21 ?0116 05/21/21 ?07/21/21 05/21/21 ?1930 05/22/21 ?0436  ?WBC  --    < > 18.0*  --  13.3* 13.7*  --   --   --  10.9*  --   --  9.6  --  6.8  ?CREATININE  --   --  1.59*  --  1.43* 1.11*  --   --  1.02*  --  0.97 0.82 0.89  --  1.01*  ?LATICACIDVEN 2.3*  2.3*  --  2.0* 2.0*  --   --  1.5 1.4  --   --   --   --   --   --   --   ?VANCOTROUGH  --   --   --   --   --   --   --   --   --   --   --   --   --   --  15  ?VANCOPEAK  --   --   --   --   --   --   --   --   --   --   --   --   --  33  --   ? < > = values in this interval not displayed.  ? ?  ?Estimated Creatinine Clearance: 81.7 mL/min (A) (by C-G formula based on SCr of 1.01 mg/dL (H)).   ? ?Allergies  ?Allergen Reactions  ? Penicillins Anaphylaxis  ?  Has patient had a PCN reaction causing immediate rash, facial/tongue/throat swelling, SOB or lightheadedness with hypotension: No ?Has patient had a PCN reaction causing severe rash involving mucus membranes or skin necrosis: No ?Has patient had a PCN reaction that required hospitalization No ?Has patient had a PCN reaction occurring within the last 10 years: No ?If all of the above answers are "NO", then may proceed  with Cephalosporin use. ?  ? Ibuprofen Nausea And Vomiting  ?  GI upset, Shakes  ? Peppermint Flavor [Flavoring Agent]   ?  Allergic to pepper the spice  ? Aspirin Nausea And Vomiting  ?  Gi upset  ? ?Vanc 5/6 >> (5/15) ?Aztreonam 5/6 >> 5/9  ?  ?5/10-5/11 VP/VT 33/15, AUC 549 on 750mg  q12 >> no change ?  ?5/5 UCx - negative ?5/5 bcx: 1/4 staph species ?5/6 TA - rare Strep pneumo, MSSA ? ?Jakyria Bleau D. 7/9, PharmD, BCPS, BCCCP ?05/22/2021, 7:07 AM ? ?

## 2021-05-23 ENCOUNTER — Inpatient Hospital Stay (HOSPITAL_COMMUNITY): Payer: Medicaid Other

## 2021-05-23 DIAGNOSIS — Z66 Do not resuscitate: Secondary | ICD-10-CM | POA: Diagnosis not present

## 2021-05-23 DIAGNOSIS — A403 Sepsis due to Streptococcus pneumoniae: Secondary | ICD-10-CM | POA: Diagnosis not present

## 2021-05-23 DIAGNOSIS — A419 Sepsis, unspecified organism: Secondary | ICD-10-CM | POA: Diagnosis not present

## 2021-05-23 DIAGNOSIS — I5043 Acute on chronic combined systolic (congestive) and diastolic (congestive) heart failure: Secondary | ICD-10-CM | POA: Diagnosis not present

## 2021-05-23 DIAGNOSIS — K7200 Acute and subacute hepatic failure without coma: Secondary | ICD-10-CM | POA: Diagnosis not present

## 2021-05-23 DIAGNOSIS — J690 Pneumonitis due to inhalation of food and vomit: Secondary | ICD-10-CM | POA: Diagnosis not present

## 2021-05-23 DIAGNOSIS — J80 Acute respiratory distress syndrome: Secondary | ICD-10-CM | POA: Diagnosis not present

## 2021-05-23 DIAGNOSIS — R6521 Severe sepsis with septic shock: Secondary | ICD-10-CM | POA: Diagnosis not present

## 2021-05-23 DIAGNOSIS — J9690 Respiratory failure, unspecified, unspecified whether with hypoxia or hypercapnia: Secondary | ICD-10-CM | POA: Diagnosis not present

## 2021-05-23 DIAGNOSIS — G928 Other toxic encephalopathy: Secondary | ICD-10-CM | POA: Diagnosis not present

## 2021-05-23 DIAGNOSIS — R570 Cardiogenic shock: Secondary | ICD-10-CM | POA: Diagnosis not present

## 2021-05-23 DIAGNOSIS — J15211 Pneumonia due to Methicillin susceptible Staphylococcus aureus: Secondary | ICD-10-CM | POA: Diagnosis not present

## 2021-05-23 DIAGNOSIS — J154 Pneumonia due to other streptococci: Secondary | ICD-10-CM | POA: Diagnosis not present

## 2021-05-23 DIAGNOSIS — Z1152 Encounter for screening for COVID-19: Secondary | ICD-10-CM | POA: Diagnosis not present

## 2021-05-23 LAB — CBC
HCT: 41 % (ref 36.0–46.0)
Hemoglobin: 12.7 g/dL (ref 12.0–15.0)
MCH: 31.8 pg (ref 26.0–34.0)
MCHC: 31 g/dL (ref 30.0–36.0)
MCV: 102.5 fL — ABNORMAL HIGH (ref 80.0–100.0)
Platelets: 162 10*3/uL (ref 150–400)
RBC: 4 MIL/uL (ref 3.87–5.11)
RDW: 15.2 % (ref 11.5–15.5)
WBC: 6 10*3/uL (ref 4.0–10.5)
nRBC: 0 % (ref 0.0–0.2)

## 2021-05-23 LAB — COMPREHENSIVE METABOLIC PANEL
ALT: 16 U/L (ref 0–44)
AST: 20 U/L (ref 15–41)
Albumin: 2.3 g/dL — ABNORMAL LOW (ref 3.5–5.0)
Alkaline Phosphatase: 119 U/L (ref 38–126)
Anion gap: 8 (ref 5–15)
BUN: 16 mg/dL (ref 6–20)
CO2: 36 mmol/L — ABNORMAL HIGH (ref 22–32)
Calcium: 8.2 mg/dL — ABNORMAL LOW (ref 8.9–10.3)
Chloride: 94 mmol/L — ABNORMAL LOW (ref 98–111)
Creatinine, Ser: 0.92 mg/dL (ref 0.44–1.00)
GFR, Estimated: >60 mL/min (ref >60)
Glucose, Bld: 151 mg/dL — ABNORMAL HIGH (ref 70–99)
Potassium: 3.9 mmol/L (ref 3.5–5.1)
Sodium: 138 mmol/L (ref 135–145)
Total Bilirubin: 0.9 mg/dL (ref 0.3–1.2)
Total Protein: 6 g/dL — ABNORMAL LOW (ref 6.5–8.1)

## 2021-05-23 LAB — POCT I-STAT 7, (LYTES, BLD GAS, ICA,H+H)
Acid-Base Excess: 14 mmol/L — ABNORMAL HIGH (ref 0.0–2.0)
Acid-Base Excess: 12 mmol/L — ABNORMAL HIGH (ref 0.0–2.0)
Acid-Base Excess: 11 mmol/L — ABNORMAL HIGH (ref 0.0–2.0)
Bicarbonate: 42.8 mmol/L — ABNORMAL HIGH (ref 20.0–28.0)
Bicarbonate: 41.6 mmol/L — ABNORMAL HIGH (ref 20.0–28.0)
Bicarbonate: 40 mmol/L — ABNORMAL HIGH (ref 20.0–28.0)
Calcium, Ion: 1.13 mmol/L — ABNORMAL LOW (ref 1.15–1.40)
Calcium, Ion: 1.16 mmol/L (ref 1.15–1.40)
Calcium, Ion: 1.15 mmol/L (ref 1.15–1.40)
HCT: 41 % (ref 36.0–46.0)
HCT: 40 % (ref 36.0–46.0)
HCT: 40 % (ref 36.0–46.0)
Hemoglobin: 13.9 g/dL (ref 12.0–15.0)
Hemoglobin: 13.6 g/dL (ref 12.0–15.0)
Hemoglobin: 13.6 g/dL (ref 12.0–15.0)
O2 Saturation: 97 %
O2 Saturation: 95 %
O2 Saturation: 96 %
Patient temperature: 98.9
Patient temperature: 99.2
Patient temperature: 99.5
Potassium: 4 mmol/L (ref 3.5–5.1)
Potassium: 3.9 mmol/L (ref 3.5–5.1)
Potassium: 3.2 mmol/L — ABNORMAL LOW (ref 3.5–5.1)
Sodium: 139 mmol/L (ref 135–145)
Sodium: 138 mmol/L (ref 135–145)
Sodium: 139 mmol/L (ref 135–145)
TCO2: 45 mmol/L — ABNORMAL HIGH (ref 22–32)
TCO2: 44 mmol/L — ABNORMAL HIGH (ref 22–32)
TCO2: 42 mmol/L — ABNORMAL HIGH (ref 22–32)
pCO2 arterial: 70.2 mmHg (ref 32–48)
pCO2 arterial: 80.1 mmHg (ref 32–48)
pCO2 arterial: 74 mmHg (ref 32–48)
pH, Arterial: 7.394 (ref 7.35–7.45)
pH, Arterial: 7.325 — ABNORMAL LOW (ref 7.35–7.45)
pH, Arterial: 7.343 — ABNORMAL LOW (ref 7.35–7.45)
pO2, Arterial: 100 mmHg (ref 83–108)
pO2, Arterial: 86 mmHg (ref 83–108)
pO2, Arterial: 95 mmHg (ref 83–108)

## 2021-05-23 LAB — GLUCOSE, CAPILLARY
Glucose-Capillary: 161 mg/dL — ABNORMAL HIGH (ref 70–99)
Glucose-Capillary: 145 mg/dL — ABNORMAL HIGH (ref 70–99)
Glucose-Capillary: 173 mg/dL — ABNORMAL HIGH (ref 70–99)
Glucose-Capillary: 177 mg/dL — ABNORMAL HIGH (ref 70–99)
Glucose-Capillary: 124 mg/dL — ABNORMAL HIGH (ref 70–99)
Glucose-Capillary: 119 mg/dL — ABNORMAL HIGH (ref 70–99)

## 2021-05-23 LAB — COOXEMETRY PANEL
Carboxyhemoglobin: 1.8 % — ABNORMAL HIGH (ref 0.5–1.5)
Carboxyhemoglobin: 1.6 % — ABNORMAL HIGH (ref 0.5–1.5)
Methemoglobin: <0.7 % (ref 0.0–1.5)
Methemoglobin: <0.7 % (ref 0.0–1.5)
O2 Saturation: 99.4 %
O2 Saturation: 82.2 %
Total hemoglobin: 13.4 g/dL (ref 12.0–16.0)
Total hemoglobin: 13.5 g/dL (ref 12.0–16.0)

## 2021-05-23 LAB — BASIC METABOLIC PANEL
Anion gap: 9 (ref 5–15)
BUN: 17 mg/dL (ref 6–20)
CO2: 36 mmol/L — ABNORMAL HIGH (ref 22–32)
Calcium: 8.5 mg/dL — ABNORMAL LOW (ref 8.9–10.3)
Chloride: 93 mmol/L — ABNORMAL LOW (ref 98–111)
Creatinine, Ser: 0.97 mg/dL (ref 0.44–1.00)
GFR, Estimated: >60 mL/min (ref >60)
Glucose, Bld: 128 mg/dL — ABNORMAL HIGH (ref 70–99)
Potassium: 3.1 mmol/L — ABNORMAL LOW (ref 3.5–5.1)
Sodium: 138 mmol/L (ref 135–145)

## 2021-05-23 LAB — HEPARIN LEVEL (UNFRACTIONATED): Heparin Unfractionated: 0.35 IU/mL (ref 0.30–0.70)

## 2021-05-23 LAB — MAGNESIUM: Magnesium: 1.7 mg/dL (ref 1.7–2.4)

## 2021-05-23 LAB — TRIGLYCERIDES: Triglycerides: 179 mg/dL — ABNORMAL HIGH (ref <150)

## 2021-05-23 MED ORDER — AMIODARONE HCL IN DEXTROSE 360-4.14 MG/200ML-% IV SOLN
30.0000 mg/h | INTRAVENOUS | Status: DC
  Administered 2021-05-23 – 2021-05-25 (×4): 30 mg/h via INTRAVENOUS
  Filled 2021-05-23 (×3): qty 200

## 2021-05-23 MED ORDER — AMIODARONE LOAD VIA INFUSION
150.0000 mg | Freq: Once | INTRAVENOUS | Status: AC
  Administered 2021-05-23: 150 mg via INTRAVENOUS
  Filled 2021-05-23: qty 83.34

## 2021-05-23 MED ORDER — STERILE WATER FOR INJECTION IJ SOLN
INTRAMUSCULAR | Status: AC
  Administered 2021-05-23: 10 mL
  Filled 2021-05-23: qty 10

## 2021-05-23 MED ORDER — OXYCODONE HCL 5 MG PO TABS
5.0000 mg | ORAL_TABLET | Freq: Four times a day (QID) | ORAL | Status: DC
Start: 2021-05-23 — End: 2021-05-27
  Administered 2021-05-23 – 2021-05-27 (×16): 5 mg
  Filled 2021-05-23 (×16): qty 1

## 2021-05-23 MED ORDER — ALBUTEROL SULFATE (2.5 MG/3ML) 0.083% IN NEBU: 2.5000 mg | INHALATION_SOLUTION | RESPIRATORY_TRACT | Status: DC | PRN

## 2021-05-23 MED ORDER — CLONAZEPAM 1 MG PO TABS
1.0000 mg | ORAL_TABLET | Freq: Two times a day (BID) | ORAL | Status: DC
  Administered 2021-05-23 – 2021-05-27 (×10): 1 mg
  Filled 2021-05-23 (×10): qty 1

## 2021-05-23 MED ORDER — SODIUM CHLORIDE 0.9% FLUSH: 10.0000 mL | INTRAVENOUS | Status: DC | PRN

## 2021-05-23 MED ORDER — MIDAZOLAM HCL 2 MG/2ML IJ SOLN
INTRAMUSCULAR | Status: AC
  Filled 2021-05-23: qty 2

## 2021-05-23 MED ORDER — MIDAZOLAM HCL 2 MG/2ML IJ SOLN
2.0000 mg | INTRAMUSCULAR | Status: DC | PRN
  Administered 2021-05-23 – 2021-05-26 (×4): 2 mg via INTRAVENOUS
  Filled 2021-05-23 (×4): qty 2

## 2021-05-23 MED ORDER — POTASSIUM CHLORIDE 10 MEQ/50ML IV SOLN
10.0000 meq | INTRAVENOUS | Status: AC
  Administered 2021-05-23 – 2021-05-24 (×4): 10 meq via INTRAVENOUS
  Filled 2021-05-23 (×4): qty 50

## 2021-05-23 MED ORDER — AMIODARONE HCL IN DEXTROSE 360-4.14 MG/200ML-% IV SOLN
60.0000 mg/h | INTRAVENOUS | Status: AC
Start: 2021-05-23 — End: 2021-05-23
  Administered 2021-05-23 (×2): 60 mg/h via INTRAVENOUS
  Filled 2021-05-23 (×3): qty 200

## 2021-05-23 MED ORDER — MAGNESIUM SULFATE 2 GM/50ML IV SOLN
2.0000 g | Freq: Once | INTRAVENOUS | Status: AC
  Administered 2021-05-23: 2 g via INTRAVENOUS
  Filled 2021-05-23: qty 50

## 2021-05-23 MED ORDER — HEPARIN (PORCINE) 25000 UT/250ML-% IV SOLN
1700.0000 [IU]/h | INTRAVENOUS | Status: DC
  Administered 2021-05-23 – 2021-05-25 (×5): 1600 [IU]/h via INTRAVENOUS
  Administered 2021-05-26 – 2021-05-29 (×6): 1700 [IU]/h via INTRAVENOUS
  Filled 2021-05-23 (×11): qty 250

## 2021-05-23 MED ORDER — LEVALBUTEROL HCL 0.63 MG/3ML IN NEBU
0.6300 mg | INHALATION_SOLUTION | Freq: Four times a day (QID) | RESPIRATORY_TRACT | Status: DC | PRN
  Administered 2021-05-23: 0.63 mg via RESPIRATORY_TRACT
  Filled 2021-05-23: qty 3

## 2021-05-23 MED ORDER — ACETAZOLAMIDE SODIUM 500 MG IJ SOLR
500.0000 mg | Freq: Once | INTRAMUSCULAR | Status: AC
  Administered 2021-05-23: 500 mg via INTRAVENOUS
  Filled 2021-05-23: qty 500

## 2021-05-23 MED ORDER — SODIUM CHLORIDE 0.9% FLUSH
10.0000 mL | Freq: Two times a day (BID) | INTRAVENOUS | Status: DC
  Administered 2021-05-23 – 2021-05-25 (×3): 10 mL
  Administered 2021-05-27: 30 mL
  Administered 2021-05-27 – 2021-05-31 (×6): 10 mL
  Administered 2021-06-01: 20 mL
  Administered 2021-06-01: 10 mL
  Administered 2021-06-02: 20 mL

## 2021-05-23 MED ORDER — CALCIUM GLUCONATE-NACL 1-0.675 GM/50ML-% IV SOLN
1.0000 g | Freq: Once | INTRAVENOUS | Status: AC
  Administered 2021-05-23: 1000 mg via INTRAVENOUS
  Filled 2021-05-23: qty 50

## 2021-05-23 MED ORDER — ARTIFICIAL TEARS OPHTHALMIC OINT
1.0000 "application " | TOPICAL_OINTMENT | Freq: Three times a day (TID) | OPHTHALMIC | Status: DC
  Administered 2021-05-23: 1 via OPHTHALMIC
  Filled 2021-05-23 (×2): qty 3.5

## 2021-05-23 NOTE — Progress Notes (Signed)
ANTICOAGULATION CONSULT NOTE ? ?Pharmacy Consult:  Heparin ?Indication: atrial fibrillation ? ?Allergies  ?Allergen Reactions  ? Penicillins Anaphylaxis  ?  Has patient had a PCN reaction causing immediate rash, facial/tongue/throat swelling, SOB or lightheadedness with hypotension: No ?Has patient had a PCN reaction causing severe rash involving mucus membranes or skin necrosis: No ?Has patient had a PCN reaction that required hospitalization No ?Has patient had a PCN reaction occurring within the last 10 years: No ?If all of the above answers are "NO", then may proceed with Cephalosporin use. ?  ? Ibuprofen Nausea And Vomiting  ?  GI upset, Shakes  ? Peppermint Flavor [Flavoring Agent]   ?  Allergic to pepper the spice  ? Aspirin Nausea And Vomiting  ?  Gi upset  ? ? ?Patient Measurements: ?Height: 5\' 3"  (160 cm) ?Weight: 117.8 kg (259 lb 11.2 oz) ?IBW/kg (Calculated) : 52.4 ?Heparin Dosing Weight: 74 kg ? ?Vital Signs: ?Temp: 97.8 ?F (36.6 ?C) (05/12 0720) ?Temp Source: Bladder (05/12 0015) ?BP: 106/63 (05/12 0321) ?Pulse Rate: 149 (05/12 0915) ? ?Labs: ?Recent Labs  ?  05/21/21 ?0625 05/22/21 ?0436 05/22/21 ?[id number] 05/22/21 ?2245 05/23/21 ?0442 05/23/21 ?0448  ?HGB 13.4 13.4   < > 13.9 12.7 13.9  ?HCT 40.2 40.6   < > 41.0 41.0 41.0  ?PLT 157 146*  --   --  162  --   ?CREATININE 0.89 1.01*  --   --  0.92  --   ? < > = values in this interval not displayed.  ? ? ?Estimated Creatinine Clearance: 90.8 mL/min (by C-G formula based on SCr of 0.92 mg/dL). ? ? ?Assessment: ?53 YOF initially started on IV heparin for rule out PE, then switched to heparin SQ for VTE prophylaxis.  Now with persistent Afib (CHADSVASC 3), so Pharmacy consulted to resume IV heparin. ? ?Patient was previously therapeutic on heparin 1600 units/hr.  CBC stable; no bleeding reported. ? ?Goal of Therapy:  ?Heparin level 0.3-0.7 units/ml ?Monitor platelets by anticoagulation protocol: Yes ?  ?Plan:  ?D/C heparin SQ ?Resume heparin gtt at 1600  units/hr - no bolus with HSQ administration this AM ?Check 6 hr heparin level ?Daily heparin level and CBC ? ?Ailyne Pawley D. Mina Marble, PharmD, BCPS, BCCCP ?05/23/2021, 9:30 AM ? ?

## 2021-05-23 NOTE — Progress Notes (Signed)
ANTICOAGULATION CONSULT NOTE ? ?Pharmacy Consult:  Heparin ?Indication: atrial fibrillation ? ?Allergies  ?Allergen Reactions  ? Penicillins Anaphylaxis  ?  Has patient had a PCN reaction causing immediate rash, facial/tongue/throat swelling, SOB or lightheadedness with hypotension: No ?Has patient had a PCN reaction causing severe rash involving mucus membranes or skin necrosis: No ?Has patient had a PCN reaction that required hospitalization No ?Has patient had a PCN reaction occurring within the last 10 years: No ?If all of the above answers are "NO", then may proceed with Cephalosporin use. ?  ? Ibuprofen Nausea And Vomiting  ?  GI upset, Shakes  ? Peppermint Flavor [Flavoring Agent]   ?  Allergic to pepper the spice  ? Aspirin Nausea And Vomiting  ?  Gi upset  ? ? ?Patient Measurements: ?Height: 5\' 3"  (160 cm) ?Weight: 117.8 kg (259 lb 11.2 oz) ?IBW/kg (Calculated) : 52.4 ?Heparin Dosing Weight: 74 kg ? ?Vital Signs: ?Temp: 98.2 ?F (36.8 ?C) (05/12 1521) ?Temp Source: Bladder (05/12 1521) ?BP: 106/57 (05/12 1503) ?Pulse Rate: 113 (05/12 1503) ? ?Labs: ?Recent Labs  ?  05/21/21 ?0625 05/22/21 ?0436 05/22/21 ?07/22/21 05/23/21 ?07/23/21 05/23/21 ?0448 05/23/21 ?1008 05/23/21 ?1608  ?HGB 13.4 13.4   < > 12.7 13.9 13.6  --   ?HCT 40.2 40.6   < > 41.0 41.0 40.0  --   ?PLT 157 146*  --  162  --   --   --   ?HEPARINUNFRC  --   --   --   --   --   --  0.35  ?CREATININE 0.89 1.01*  --  0.92  --   --   --   ? < > = values in this interval not displayed.  ? ? ? ?Estimated Creatinine Clearance: 90.8 mL/min (by C-G formula based on SCr of 0.92 mg/dL). ? ? ?Assessment: ?30 YOF initially started on IV heparin for rule out PE, then switched to heparin SQ for VTE prophylaxis.  Now with persistent Afib (CHADSVASC 3), so Pharmacy consulted to resume IV heparin. ? ?-heparin level at goal on 1600 units/hr ? ?Goal of Therapy:  ?Heparin level 0.3-0.7 units/ml ?Monitor platelets by anticoagulation protocol: Yes ?  ?Plan:  ?-Continue heparin  at 1600 units/hr ?-Daily heparin level and CBC ? ?44, PharmD ?Clinical Pharmacist ?**Pharmacist phone directory can now be found on [email or web address] ([email or web address]).  Listed under [facility]. ? ? ?

## 2021-05-23 NOTE — Progress Notes (Signed)
Peripherally Inserted Central Catheter Placement ? ?The IV Nurse has discussed with the patient and/or persons authorized to consent for the patient, the purpose of this procedure and the potential benefits and risks involved with this procedure.  The benefits include less needle sticks, lab draws from the catheter, and the patient may be discharged home with the catheter. Risks include, but not limited to, infection, bleeding, blood clot (thrombus formation), and puncture of an artery; nerve damage and irregular heartbeat and possibility to perform a PICC exchange if needed/ordered by physician.  Alternatives to this procedure were also discussed.  Bard Power PICC patient education guide, fact sheet on infection prevention and patient information card has been provided to patient /or left at bedside.   ? ?PICC Placement Documentation  ?PICC Triple Lumen [id number] Right Basilic 41 cm 1 cm (Active)  ?Indication for Insertion or Continuance of Line Vasoactive infusions 05/23/21 0900  ?Exposed Catheter (cm) 1 cm 05/23/21 0900  ?Site Assessment Clean, Dry, Intact 05/23/21 0900  ?Lumen #1 Status Flushed;Blood return noted;Saline locked 05/23/21 0900  ?Lumen #2 Status Flushed;Blood return noted;Saline locked 05/23/21 0900  ?Lumen #3 Status Flushed;Blood return noted;Saline locked 05/23/21 0900  ?Dressing Type Transparent 05/23/21 0900  ?Dressing Status Antimicrobial disc in place 05/23/21 0900  ?Dressing Change Due 05/30/21 05/23/21 0900  ? ? ? ? ? ?[name] ?05/23/2021, 9:24 AM ? ?

## 2021-05-23 NOTE — Progress Notes (Signed)
eLink Physician-Brief Progress Note ?Patient Name: [name] ?DOB: 17-May-1970 ?MRN: [id number] ? ? ?Date of Service ? 05/23/2021  ?HPI/Events of Note ? K 3.1 ?Mg 1.7  ?eICU Interventions ? Additional potassium replacement ordered ?Mg replaced  ? ? ? ?Intervention Category ?Major Interventions: Electrolyte abnormality - evaluation and management ? ?Henry Russel, P ?05/23/2021, 9:16 PM ?

## 2021-05-23 NOTE — Progress Notes (Signed)
? ?NAME:  [name], MRN:  [id number], DOB:  07/07/1970, LOS: 7 ?ADMISSION DATE:  05/16/2021, CONSULTATION DATE:  5/5 ?REFERRING MD:  Dr. Sabra Heck CHIEF COMPLAINT:  ARF  ? ?History of Present Illness:  ?Patient is a 51 year old female with pertinent PMH of anxiety, depression, chronic pain syndrome on multiple psych meds presents to [facility] on 5/5 with acute respiratory failure. ? ?Patient recently admitted 02/14/2021 with similar episode of respiratory failure with sepsis and aspiration pneumonia and patient was intubated.  Patient left AMA on 02/21/2021.  Patient at home takes multiple psych meds: Xanax, amitriptyline, Lyrica, Trintellix, Ambien, citalopram. ?  ?On 5/5, family went to see patient at home and found unresponsive on floor.  EMS called and patient was unresponsive and sats in 68s.  Patient bagged and transported to [facility] ED.  On arrival to [facility] ED patient GCS very low.  Breathing spontaneously but requiring BVM.  Intubated for hypercarbic hypoxic respiratory failure. ? ?Past Medical History:  ? ? Anxiety    ? Back pain    ? Chronic back pain 02/04/13  ?  By patient report  ? Depression    ? Factor 5 Leiden mutation, heterozygous ([place])    ? Hypertension    ? Laryngitis, chronic    ? Panic attack    ? Scoliosis    ? Ulcer   ? ? ?Significant Hospital Events:  ?5/5 admitted to [facility] intubated for resp failure; transferring to [facility]  ?5/6 slight hypoxia this a.m. on vent requiring increase of PEEP from 5-8 and up titration of FiO2 from 70-100 ?5/7 notified of positive blood cultures for night, antibiotic regiment remains seen with broad coverage.  She is more alert and oriented this a.m. ?5/8 severe LV and RV dysfunction, LVEF 20%.  Persistent hypoxia and hypercarbia.  ?5/9 weaned off paralytics and milnirone  ?5/10 weaned off of epoprostenol  ?5/11 weaned off of levophed  ?5/12 poor synchrony on MV, increasing sedation and restart paralytics. Amiodarone restarted for aflutter. PICC line placed  ? ? ?Antimicrobials:   ?Vancomycin 5/5-5/10 ?Levofloxacin 5/11> ? ?Interim History / Subjective:  ?Overnight given vecuronium and versed for poor vent synchrony and foley placed. Back on levophed this AM. Wakes to verbal stimuli. Remains asynchronous on PRVC and volume control.  ? ?Objective   ?Blood pressure 106/63, pulse (!) 120, temperature 97.8 ?F (36.6 ?C), resp. rate (!) 30, height 5\' 3"  (1.6 m), weight 117.8 kg, SpO2 100 %. ?   ?Vent Mode: PRVC ?FiO2 (%):  [50 %-60 %] 50 % ?Set Rate:  [30 bmp] 30 bmp ?Vt Set:  [320 mL] 320 mL ?PEEP:  [8 cmH20] 8 cmH20 ?Plateau Pressure:  [17 cmH20-40 cmH20] 18 cmH20  ? ?Intake/Output Summary (Last 24 hours) at 05/23/2021 0824 ?Last data filed at 05/23/2021 0700 ?Gross per 24 hour  ?Intake 2407.61 ml  ?Output 5625 ml  ?Net -3217.39 ml  ? ?Filed Weights  ? 05/20/21 0333 05/22/21 0437 05/23/21 0500  ?Weight: 123.4 kg 115.7 kg 117.8 kg  ? ? ?Examination: ?General: Ill-appearing woman, NAD, intubated  ?HENT: [place]/AT, sclerae anicteric  ?Lungs: CTA bilaterally, asynchronous breathing on mechanical ventilation ?Cardiovascular: regular rhythm, tachycardia, no murmurs rubs or gallops ?Abdomen: Soft, bowel sounds present ?Extremities: 2+ pitting edema bilateral lower extremities, warm and dry ?Neuro: opens eyes to verbal stimuli, sedated  ? ?Resolved Hospital Problem list   ?Shock liver ? ?Assessment & Plan:  ? ?Cor pulmonale, acute ?Severe RV dysfunction on echocardiogram with pressue volume overload. Weaned off milrinone yesterday.  Successfully weaned off epoprostenol yesterday. Repat POC ultrasound on 5/10 shows minimal improvement in EF, approximately ~25-30%. RV still appears overloaded. PICC line and co-ox panel pending.  ?-PICC line ?-Follow up co-ox panel ?-Lasix infusion ?  ?Acute hypoxic and hypercapnic respiratory failure hypercapnia  ?ARDS ?Refractory hypoxia and hypercarbia due to predominantly right-sided airspace disease and possible superimposed pulmonary edema. Asymmetry limits utility of PEEP.  FiO2 at 50% today.  ABG overnight pCO2 70.2, repeat ABG shows pCO2 79. Will increase sedation and restart paralytics in hopes of improving MV.  ?-Increase sedation and restart paralytics ?-Continue lung protective ventilation targeting plateau pressure less than 30 torr and tidal volume less than 8 mL/kg (6 cc equals 312 mL, 8 cc 416 mL)  ?-Continue diuresis  ? ?Septic versus cardiogenic shock ?Back on levophed this am. Continue Levaquin. Shock likely largely due to RV dysfunction, which is not much improved on POC [place] on 5/10.  ?-Maintain MAP greater than 65 ?-Continue levofloxacin  ?  ?Acute encephalopathy ?RASS goal -5, -4 to achieve synchrony on MV. Increase sedation and start paralytics.  ?-Continue propofol and fentanyl ?-Start versed and vecuronium ?-Oxycodone and klonopin per tube  ?  ?HFrEF ?EF 20%, change from echo 2 years ago.  Possibly sepsis induced myocardial dysfunction. Likely also component of diastolic dysfunction with restrictive filling from RV pressure volume overload. Milnirone weaned off. POC [place] with minimal improvement of EF. ?-Continue lasix infusion ?  ?Atrial flutter ?Patient's HR remains in the 120s and appears to be back in flutter. ?-Follow up EKG ?-Restart amiodarone  ?-Restart anticoagulation ? ?Best practice (evaluated daily)  ?Diet: NPO, tube feeds ?Pain/Anxiety/Delirium protocol (if indicated): fentanyl, propofol, oxy, klonopin ?VAP protocol (if indicated): yes ?DVT prophylaxis: heparin ?GI prophylaxis: PPI ?Glucose control: none ?Mobility: bedrest ?Disposition:ICU ? ?Goals of Care:  ?Last date of multidisciplinary goals of care discussion:5/12 ?Family and staff present: yes ?Summary of discussion: increased sedation/restart paralytics to improve MV ?Follow up goals of care discussion due: 5/13 ?Code Status: DNR ? ?Labs   ?CBC: ?Recent Labs  ?Lab 05/16/21 ?2130 05/17/21 ?[id number] 05/19/21 ?[id number] 05/19/21 ?[id number] 05/20/21 ?[id number] 05/21/21 ?[id number] 05/21/21 ?[id number] 05/22/21 ?0436 05/22/21 ?[id number]  05/22/21 ?2041 05/22/21 ?2245 05/23/21 ?0442 05/23/21 ?0448  ?WBC 20.3*   < > 13.7*  --  10.9*  --  9.6 6.8  --   --   --  6.0  --   ?NEUTROABS 17.6*  --   --   --   --   --   --   --   --   --   --   --   --   ?HGB 14.1   < > 15.2*   < > 13.5   < > 13.4 13.4 13.9 14.6 13.9 12.7 13.9  ?HCT 47.4*   < > 48.6*   < > 39.3   < > 40.2 40.6 41.0 43.0 41.0 41.0 41.0  ?MCV 108.0*   < > 103.2*  --  95.6  --  97.6 98.3  --   --   --  102.5*  --   ?PLT 209   < > 220  --  153  --  157 146*  --   --   --  162  --   ? < > = values in this interval not displayed.  ? ? ?Basic Metabolic Panel: ?Recent Labs  ?Lab 05/17/21 ?[id number] 05/17/21 ?[id number] 05/18/21 ?[id number] 05/18/21 ?1228 05/20/21 ?[id number] 05/20/21 ?[id number] 05/20/21 ?1205 05/21/21 ?0116 05/21/21 ?[id number] 05/21/21 ?[id number] 05/22/21 ?0436 05/22/21 ?  [id number] 05/22/21 ?2041 05/22/21 ?2245 05/23/21 ?0442 05/23/21 ?0448  ?NA  --    < > 139   < > 136   < > 136 137   < > 137 137 136 138 138 138 139  ?K  --    < > 3.9   < > 3.2*   < > 3.6 3.8   < > 3.7 4.0 3.8 4.5 4.6 3.9 4.0  ?CL  --    < > 103   < > 94*  --  95* 99  --  94* 98  --   --   --  94*  --   ?CO2  --    < > 29   < > 32  --  31 32  --  33* 33*  --   --   --  36*  --   ?GLUCOSE  --    < > 138*   < > 152*  --  147* 141*  --  163* 137*  --   --   --  151*  --   ?BUN  --    < > 18   < > 18  --  14 14  --  13 14  --   --   --  16  --   ?CREATININE  --    < > 1.43*   < > 1.02*  --  0.97 0.82  --  0.89 1.01*  --   --   --  0.92  --   ?CALCIUM  --    < > 7.9*   < > 7.7*  --  8.1* 7.8*  --  8.1* 8.2*  --   --   --  8.2*  --   ?MG 2.2  --  1.8  --  1.5*  --   --  1.8  --  2.1  --   --   --   --   --   --   ?PHOS 3.5  --   --   --   --   --   --   --   --   --   --   --   --   --   --   --   ? < > = values in this interval not displayed.  ? ?GFR: ?Estimated Creatinine Clearance: 90.8 mL/min (by C-G formula based on SCr of 0.92 mg/dL). ?Recent Labs  ?Lab 05/17/21 ?[id number] 05/17/21 ?[id number] 05/17/21 ?[id number] 05/18/21 ?0409 05/19/21 ?1024 05/19/21 ?1329 05/20/21 ?[id number]  05/21/21 ?[id number] 05/22/21 ?[id number] 05/23/21 ?[id number]  ?PROCALCITON 0.90  --   --   --   --   --   --   --   --   --   ?WBC 22.2* 18.0*  --    < >  --   --  10.9* 9.6 6.8 6.0  ?LATICACIDVEN  --  2.0* 2.0*  --  1.5 1.

## 2021-05-23 NOTE — TOC Progression Note (Signed)
Transition of Care (TOC) - Initial/Assessment Note  ? ? ?Patient Details  ?Name: [name] ?MRN: [id number] ?Date of Birth: 12-Jan-1971 ? ?Transition of Care (TOC) CM/SW Contact:    ?Paulene Floor Alys Dulak, LCSWA ?Phone Number: ?05/23/2021, 3:05 PM ? ?Clinical Narrative:                 ?Patient remains intubated.   ? ?Transition of Care Department [facility]) has reviewed patient and no TOC needs have been identified at this time. We will continue to monitor patient advancement through interdisciplinary progression rounds. If new patient transition needs arise, please place a TOC consult. ?  ? ?  ?  ? ? ?Patient Goals and CMS Choice ?  ?  ?  ? ?Expected Discharge Plan and Services ?  ?  ?  ?  ?  ?                ?  ?  ?  ?  ?  ?  ?  ?  ?  ?  ? ?Prior Living Arrangements/Services ?  ?  ?  ?       ?  ?  ?  ?  ? ?Activities of Daily Living ?  ?  ? ?Permission Sought/Granted ?  ?  ?   ?   ?   ?   ? ?Emotional Assessment ?  ?  ?  ?  ?  ?  ? ?Admission diagnosis:  Acute respiratory failure with hypercapnia ([place]) [J96.02] ?Hypercapnic respiratory failure ([place]) [J96.92] ?Sepsis, due to unspecified organism, unspecified whether acute organ dysfunction present ([place]) [A41.9] ?Patient Active Problem List  ? Diagnosis Date Noted  ? HFrEF (heart failure with reduced ejection fraction) ([place]) 05/19/2021  ? Cor pulmonale, acute ([place]) 05/19/2021  ? ARDS (adult respiratory distress syndrome) ([place])   ? Septic shock ([place])   ? Acute encephalopathy   ? Factor V Leiden mutation ([place]) 02/14/2021  ? Respiratory failure with hypoxia and hypercapnia ([place]) 02/14/2021  ? Lactic acidosis 02/14/2021  ? H/O gastroesophageal reflux (GERD) 02/14/2021  ? Hyperlipidemia 02/14/2021  ? Generalized weakness 08/31/2016  ? History of embolectomy of left arm.  12/11/2015  ? Depression with anxiety 12/11/2015  ? Essential hypertension 12/11/2015  ? Chronic back pain 12/11/2015  ? Ischemia of hand 12/06/2015  ? 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 Knee pain  05/31/2012  ? Bilateral leg weakness 11/20/2010  ? ?PCP:  Lemmie Evens, MD ?Pharmacy:   ?[place] ?[place] [address] ?Phone: [phone] Fax: [phone] ? ? ? ? ?Social Determinants of Health (SDOH) Interventions ?  ? ?Readmission Risk Interventions ?   ? View : No data to display.  ?  ?  ?  ? ? ? ?

## 2021-05-24 ENCOUNTER — Inpatient Hospital Stay (HOSPITAL_COMMUNITY): Payer: Medicaid Other

## 2021-05-24 DIAGNOSIS — J9602 Acute respiratory failure with hypercapnia: Secondary | ICD-10-CM | POA: Diagnosis not present

## 2021-05-24 DIAGNOSIS — J9690 Respiratory failure, unspecified, unspecified whether with hypoxia or hypercapnia: Secondary | ICD-10-CM | POA: Diagnosis not present

## 2021-05-24 LAB — POCT I-STAT 7, (LYTES, BLD GAS, ICA,H+H)
Acid-Base Excess: 16 mmol/L — ABNORMAL HIGH (ref 0.0–2.0)
Acid-Base Excess: 10 mmol/L — ABNORMAL HIGH (ref 0.0–2.0)
Acid-Base Excess: 14 mmol/L — ABNORMAL HIGH (ref 0.0–2.0)
Bicarbonate: 40.7 mmol/L — ABNORMAL HIGH (ref 20.0–28.0)
Bicarbonate: 39 mmol/L — ABNORMAL HIGH (ref 20.0–28.0)
Bicarbonate: 40.7 mmol/L — ABNORMAL HIGH (ref 20.0–28.0)
Calcium, Ion: 1.11 mmol/L — ABNORMAL LOW (ref 1.15–1.40)
Calcium, Ion: 1.16 mmol/L (ref 1.15–1.40)
Calcium, Ion: 1.19 mmol/L (ref 1.15–1.40)
HCT: 41 % (ref 36.0–46.0)
HCT: 41 % (ref 36.0–46.0)
HCT: 41 % (ref 36.0–46.0)
Hemoglobin: 13.9 g/dL (ref 12.0–15.0)
Hemoglobin: 13.9 g/dL (ref 12.0–15.0)
Hemoglobin: 13.9 g/dL (ref 12.0–15.0)
O2 Saturation: 94 %
O2 Saturation: 97 %
O2 Saturation: 96 %
Patient temperature: 98.6
Patient temperature: 99.3
Patient temperature: 98.7
Potassium: 3.5 mmol/L (ref 3.5–5.1)
Potassium: 4.2 mmol/L (ref 3.5–5.1)
Potassium: 3.6 mmol/L (ref 3.5–5.1)
Sodium: 137 mmol/L (ref 135–145)
Sodium: 138 mmol/L (ref 135–145)
Sodium: 138 mmol/L (ref 135–145)
TCO2: 42 mmol/L — ABNORMAL HIGH (ref 22–32)
TCO2: 41 mmol/L — ABNORMAL HIGH (ref 22–32)
TCO2: 42 mmol/L — ABNORMAL HIGH (ref 22–32)
pCO2 arterial: 49 mmHg — ABNORMAL HIGH (ref 32–48)
pCO2 arterial: 72.3 mmHg (ref 32–48)
pCO2 arterial: 59.3 mmHg — ABNORMAL HIGH (ref 32–48)
pH, Arterial: 7.527 — ABNORMAL HIGH (ref 7.35–7.45)
pH, Arterial: 7.342 — ABNORMAL LOW (ref 7.35–7.45)
pH, Arterial: 7.445 (ref 7.35–7.45)
pO2, Arterial: 65 mmHg — ABNORMAL LOW (ref 83–108)
pO2, Arterial: 103 mmHg (ref 83–108)
pO2, Arterial: 83 mmHg (ref 83–108)

## 2021-05-24 LAB — CBC
HCT: 40.9 % (ref 36.0–46.0)
Hemoglobin: 13.1 g/dL (ref 12.0–15.0)
MCH: 32 pg (ref 26.0–34.0)
MCHC: 32 g/dL (ref 30.0–36.0)
MCV: 100 fL (ref 80.0–100.0)
Platelets: 189 10*3/uL (ref 150–400)
RBC: 4.09 MIL/uL (ref 3.87–5.11)
RDW: 15 % (ref 11.5–15.5)
WBC: 6 10*3/uL (ref 4.0–10.5)
nRBC: 0 % (ref 0.0–0.2)

## 2021-05-24 LAB — COOXEMETRY PANEL
Carboxyhemoglobin: 1.7 % — ABNORMAL HIGH (ref 0.5–1.5)
Methemoglobin: <0.7 % (ref 0.0–1.5)
O2 Saturation: 97.9 %
Total hemoglobin: 13.7 g/dL (ref 12.0–16.0)

## 2021-05-24 LAB — COMPREHENSIVE METABOLIC PANEL
ALT: 16 U/L (ref 0–44)
AST: 19 U/L (ref 15–41)
Albumin: 2.3 g/dL — ABNORMAL LOW (ref 3.5–5.0)
Alkaline Phosphatase: 124 U/L (ref 38–126)
Anion gap: 9 (ref 5–15)
BUN: 18 mg/dL (ref 6–20)
CO2: 35 mmol/L — ABNORMAL HIGH (ref 22–32)
Calcium: 8.4 mg/dL — ABNORMAL LOW (ref 8.9–10.3)
Chloride: 93 mmol/L — ABNORMAL LOW (ref 98–111)
Creatinine, Ser: 0.98 mg/dL (ref 0.44–1.00)
GFR, Estimated: >60 mL/min (ref >60)
Glucose, Bld: 144 mg/dL — ABNORMAL HIGH (ref 70–99)
Potassium: 3.4 mmol/L — ABNORMAL LOW (ref 3.5–5.1)
Sodium: 137 mmol/L (ref 135–145)
Total Bilirubin: 0.6 mg/dL (ref 0.3–1.2)
Total Protein: 5.8 g/dL — ABNORMAL LOW (ref 6.5–8.1)

## 2021-05-24 LAB — HEPARIN LEVEL (UNFRACTIONATED): Heparin Unfractionated: 0.45 IU/mL (ref 0.30–0.70)

## 2021-05-24 LAB — GLUCOSE, CAPILLARY
Glucose-Capillary: 106 mg/dL — ABNORMAL HIGH (ref 70–99)
Glucose-Capillary: 171 mg/dL — ABNORMAL HIGH (ref 70–99)
Glucose-Capillary: 163 mg/dL — ABNORMAL HIGH (ref 70–99)
Glucose-Capillary: 159 mg/dL — ABNORMAL HIGH (ref 70–99)
Glucose-Capillary: 154 mg/dL — ABNORMAL HIGH (ref 70–99)

## 2021-05-24 MED ORDER — CALCIUM GLUCONATE-NACL 1-0.675 GM/50ML-% IV SOLN
1.0000 g | Freq: Once | INTRAVENOUS | Status: AC
  Administered 2021-05-24: 1000 mg via INTRAVENOUS
  Filled 2021-05-24: qty 50

## 2021-05-24 NOTE — Progress Notes (Signed)
Critical ABG results given to Dr Denese Killings ?pH- 7.34 ?pCO2-72.3 ?pO2-103 ?HCO3-39 ? ?Dr orders RR-22, Vt 8 cc/kg which is 420 ?

## 2021-05-24 NOTE — Progress Notes (Signed)
ANTICOAGULATION CONSULT NOTE ? ?Pharmacy Consult:  Heparin ?Indication: atrial fibrillation ? ?Allergies  ?Allergen Reactions  ? Penicillins Anaphylaxis  ?  Has patient had a PCN reaction causing immediate rash, facial/tongue/throat swelling, SOB or lightheadedness with hypotension: No ?Has patient had a PCN reaction causing severe rash involving mucus membranes or skin necrosis: No ?Has patient had a PCN reaction that required hospitalization No ?Has patient had a PCN reaction occurring within the last 10 years: No ?If all of the above answers are "NO", then may proceed with Cephalosporin use. ?  ? Ibuprofen Nausea And Vomiting  ?  GI upset, Shakes  ? Peppermint Flavor [Flavoring Agent]   ?  Allergic to pepper the spice  ? Aspirin Nausea And Vomiting  ?  Gi upset  ? ? ?Patient Measurements: ?Height: 5\' 3"  (160 cm) ?Weight: 114.9 kg (253 lb 4.9 oz) ?IBW/kg (Calculated) : 52.4 ?Heparin Dosing Weight: 74 kg ? ?Vital Signs: ?Temp: 97.5 ?F (36.4 ?C) (05/13 1126) ?Temp Source: Bladder (05/13 1126) ?BP: 129/69 (05/13 01-01-2000) ?Pulse Rate: 117 (05/13 0815) ? ?Labs: ?Recent Labs  ?  05/22/21 ?0436 05/22/21 ?07/22/21 05/23/21 ?0442 05/23/21 ?0448 05/23/21 ?1608 05/23/21 ?1806 05/23/21 ?2009 05/24/21 ?0453 05/24/21 ?0454 05/24/21 ?1127  ?HGB 13.4   < > 12.7   < >  --    < >  --  13.1 13.9 13.9  ?HCT 40.6   < > 41.0   < >  --    < >  --  40.9 41.0 41.0  ?PLT 146*  --  162  --   --   --   --  189  --   --   ?HEPARINUNFRC  --   --   --   --  0.35  --   --  0.45  --   --   ?CREATININE 1.01*  --  0.92  --   --   --  0.97 0.98  --   --   ? < > = values in this interval not displayed.  ? ? ? ?Estimated Creatinine Clearance: 83.9 mL/min (by C-G formula based on SCr of 0.98 mg/dL). ? ? ?Assessment: ?76 YOF initially started on IV heparin for rule out PE, then switched to heparin SQ for VTE prophylaxis.  Now with persistent Afib (CHADSVASC 3), so Pharmacy consulted to resume IV heparin. ? ?-heparin level at goal on 1600 units/hr ? ?Goal of  Therapy:  ?Heparin level 0.3-0.7 units/ml ?Monitor platelets by anticoagulation protocol: Yes ?  ?Plan:  ?-Continue heparin at 1600 units/hr ?-Daily heparin level and CBC ? ?Thank you for allowing pharmacy to be a part of this patient?s care. ? ?44, PharmD ?Clinical Pharmacist ? ?Please check AMION for all [facility] Pharmacy numbers ?After 10:00 PM, call Main Pharmacy [phone] ? ? ? ?

## 2021-05-24 NOTE — Progress Notes (Signed)
? ?NAME:  [name], MRN:  [id number], DOB:  Dec 04, 1970, LOS: 8 ?ADMISSION DATE:  05/16/2021, CONSULTATION DATE:  5/5 ?REFERRING MD:  Dr. Sabra Heck CHIEF COMPLAINT:  ARF  ? ?History of Present Illness:  ?Patient is a 51 year old female with pertinent PMH of anxiety, depression, chronic pain syndrome on multiple psych meds presents to [facility] on 5/5 with acute respiratory failure. ? ?Patient recently admitted 02/14/2021 with similar episode of respiratory failure with sepsis and aspiration pneumonia and patient was intubated.  Patient left AMA on 02/21/2021.  Patient at home takes multiple psych meds: Xanax, amitriptyline, Lyrica, Trintellix, Ambien, citalopram. ?  ?On 5/5, family went to see patient at home and found unresponsive on floor.  EMS called and patient was unresponsive and sats in 28s.  Patient bagged and transported to [facility] ED.  On arrival to [facility] ED patient GCS very low.  Breathing spontaneously but requiring BVM.  Intubated for hypercarbic hypoxic respiratory failure. ? ?Past Medical History:  ? ? Anxiety    ? Back pain    ? Chronic back pain 02/04/13  ?  By patient report  ? Depression    ? Factor 5 Leiden mutation, heterozygous ([place])    ? Hypertension    ? Laryngitis, chronic    ? Panic attack    ? Scoliosis    ? Ulcer   ? ? ?Significant Hospital Events:  ?5/5 admitted to [facility] intubated for resp failure; transferring to [facility]  ?5/6 slight hypoxia this a.m. on vent requiring increase of PEEP from 5-8 and up titration of FiO2 from 70-100 ?5/7 notified of positive blood cultures for night, antibiotic regiment remains seen with broad coverage.  She is more alert and oriented this a.m. ?5/8 severe LV and RV dysfunction, LVEF 20%.  Persistent hypoxia and hypercarbia.  ?5/9 weaned off paralytics and milnirone  ?5/10 weaned off of epoprostenol  ?5/11 weaned off of levophed  ?5/12 poor synchrony on MV, increasing sedation and restart paralytics. Amiodarone restarted for aflutter. PICC line placed  ?5/13 improved  ventilator synchrony.  Now 5 L net negative.  Improving oxygenation. ? ? ?Antimicrobials:  ?Vancomycin 5/5-5/10 ?Levofloxacin 5/11> ? ?Interim History / Subjective:  ?Main synchronous overnight but hypercarbic on 6 cc/kg ? ?Objective   ?Blood pressure 129/69, pulse (!) 110, temperature (!) 97.5 ?F (36.4 ?C), temperature source Bladder, resp. rate (!) 22, height 5\' 3"  (1.6 m), weight 114.9 kg, SpO2 95 %. ?   ?Vent Mode: PRVC ?FiO2 (%):  [40 %-80 %] 40 % ?Set Rate:  [22 bmp-30 bmp] 22 bmp ?Vt Set:  [320 mL-420 mL] 420 mL ?PEEP:  [8 cmH20-12 cmH20] 8 cmH20 ?Plateau Pressure:  [17 cmH20-23 cmH20] 18 cmH20  ? ?Intake/Output Summary (Last 24 hours) at 05/24/2021 1313 ?Last data filed at 05/24/2021 1200 ?Gross per 24 hour  ?Intake 4322.3 ml  ?Output 7275 ml  ?Net -2952.7 ml  ? ? ?Filed Weights  ? 05/22/21 0437 05/23/21 0500 05/24/21 0459  ?Weight: 115.7 kg 117.8 kg 114.9 kg  ? ? ?Examination: ?General: Ill-appearing woman, NAD, intubated  ?HENT: [place]/AT, sclerae anicteric  ?Lungs: CTA bilaterally, increased with mechanical ventilation. ?Cardiovascular: regular rhythm, tachycardia, no murmurs rubs or gallops ?Abdomen: Soft, bowel sounds present ?Extremities: 2+ pitting edema bilateral lower extremities, warm and dry ?Neuro: opens eyes to verbal stimuli, sedated  ? ?Resolved Hospital Problem list   ?Shock liver ? ?Assessment & Plan:  ?Cor pulmonale, acute ([place]) ?Severe RV dysfunction on echocardiogram with pressue volume overload.  Developed tachycardia with milrinone.  Milrinone off, off epoprostenol.  Follow-up point-of-care echo shows persistent RV dysfunction with last septal dyssynchrony. ?SCV O2 remains normal. ? ?-Continue diuresis.  No inotropic support at this time. ? ? ? ? ?Respiratory failure with hypoxia and hypercapnia (HCC) ?Refractory hypoxia and hypercarbia due to predominantly right-sided airspace disease and possible superimposed pulmonary edema. ?Improved oxygenation with epoprostenol, diuresis and reduction in  PEEP to prevent over distention. ?Tolerated paralytic interruption.  ?Hypercarbia on 6cc/kg with dyssynchrony.  ? ?-Continue lung protective ventilation targeting plateau pressure less than 30 torr and tidal volume less than 8 mL/kg (6 cc equals 312 mL, 8 cc 416 mL)  ?- Keep PEEP 8 today and wean FiO2 to 0.4 if possible, Keep Sat >90  ?- Start weaning sedation, target RASS - 3,-2 ? ? ?ARDS (adult respiratory distress syndrome) ([place]) ?Bilateral airspace disease predominantly on the right side due to right lung aspiration.  Asymmetry limits utility of PEEP. ? ?-Continue lung protective ventilation. ?- Complete course of antibiotics for possible  ? ?Septic shock ([place]) ?Currently requiring low-dose norepinephrine. S.epidermidis .  However, shock may be largely due to RV dysfunction. ? ?-Titrate norepinephrine to keep MAP greater than 65.  Likely offsetting sedation at this point. ?-Off milrinone and epoprostenol ?-Lactate has cleared. ? ?Acute encephalopathy ?Off NMB and eyes opening but not tracking. ? ?-Start weaning sedation, now targeting RASS  -2,-3 ? ?HFrEF (heart failure with reduced ejection fraction) ([place]) ?EF 20%, change from echo 2 years ago.  Possibly sepsis induced myocardial dysfunction. ?Likely also component of diastolic dysfunction with restrictive filling from RV pressure volume overload. ? ?-Continue furosemide infusion as finally impacting volume overload.  Ideally would like to see her at least 10 L net negative before transitioning back to intermittent furosemide. ? ? ?Best practice (evaluated daily)  ?Diet: NPO, tube feeds ?Pain/Anxiety/Delirium protocol (if indicated): fentanyl, propofol, oxy, klonopin ?VAP protocol (if indicated): yes ?DVT prophylaxis: heparin ?GI prophylaxis: PPI ?Glucose control: none ?Mobility: bedrest ?Disposition:ICU ? ?Goals of Care:  ?Last date of multidisciplinary goals of care discussion:5/12 ?Family and staff present: yes ?Summary of discussion: increased  sedation/restart paralytics to improve MV ?Follow up goals of care discussion due: 5/13 ?Code Status: DNR ? ?Labs   ?CBC: ?Recent Labs  ?Lab 05/20/21 ?[id number] 05/21/21 ?[id number] 05/21/21 ?[id number] 05/22/21 ?0436 05/22/21 ?[id number] 05/23/21 ?[id number] 05/23/21 ?0448 05/23/21 ?1008 05/23/21 ?1806 05/24/21 ?[id number] 05/24/21 ?[id number] 05/24/21 ?1127  ?WBC 10.9*  --  9.6 6.8  --  6.0  --   --   --  6.0  --   --   ?HGB 13.5   < > 13.4 13.4   < > 12.7   < > 13.6 13.6 13.1 13.9 13.9  ?HCT 39.3   < > 40.2 40.6   < > 41.0   < > 40.0 40.0 40.9 41.0 41.0  ?MCV 95.6  --  97.6 98.3  --  102.5*  --   --   --  100.0  --   --   ?PLT 153  --  157 146*  --  162  --   --   --  189  --   --   ? < > = values in this interval not displayed.  ? ? ? ?Basic Metabolic Panel: ?Recent Labs  ?Lab 05/18/21 ?0409 05/18/21 ?1228 05/20/21 ?0334 05/20/21 ?0356 05/21/21 ?0116 05/21/21 ?[id number] 05/21/21 ?[id number] 05/22/21 ?0436 05/22/21 ?[id number] 05/23/21 ?0442 05/23/21 ?0448 05/23/21 ?1806 05/23/21 ?2009 05/24/21 ?0453 05/24/21 ?0454 05/24/21 ?1127  ?NA 139   < >  136   < > 137   < > 137 137   < > 138   < > 139 138 137 137 138  ?K 3.9   < > 3.2*   < > 3.8   < > 3.7 4.0   < > 3.9   < > 3.2* 3.1* 3.4* 3.5 4.2  ?CL 103   < > 94*   < > 99  --  94* 98  --  94*  --   --  93* 93*  --   --   ?CO2 29   < > 32   < > 32  --  33* 33*  --  36*  --   --  36* 35*  --   --   ?GLUCOSE 138*   < > 152*   < > 141*  --  163* 137*  --  151*  --   --  128* 144*  --   --   ?BUN 18   < > 18   < > 14  --  13 14  --  16  --   --  17 18  --   --   ?CREATININE 1.43*   < > 1.02*   < > 0.82  --  0.89 1.01*  --  0.92  --   --  0.97 0.98  --   --   ?CALCIUM 7.9*   < > 7.7*   < > 7.8*  --  8.1* 8.2*  --  8.2*  --   --  8.5* 8.4*  --   --   ?MG 1.8  --  1.5*  --  1.8  --  2.1  --   --   --   --   --  1.7  --   --   --   ? < > = values in this interval not displayed.  ? ? ?GFR: ?Estimated Creatinine Clearance: 83.9 mL/min (by C-G formula based on SCr of 0.98 mg/dL). ?Recent Labs  ?Lab 05/19/21 ?1024 05/19/21 ?1329  05/20/21 ?[id number] 05/21/21 ?[id number] 05/22/21 ?[id number] 05/23/21 ?[id number] 05/24/21 ?[id number]  ?WBC  --   --    < > 9.6 6.8 6.0 6.0  ?LATICACIDVEN 1.5 1.4  --   --   --   --   --   ? < > = values in this interval not displayed.  ? ? ? ?Liver Function

## 2021-05-25 DIAGNOSIS — J9602 Acute respiratory failure with hypercapnia: Secondary | ICD-10-CM | POA: Diagnosis not present

## 2021-05-25 LAB — POCT I-STAT 7, (LYTES, BLD GAS, ICA,H+H)
Acid-Base Excess: 12 mmol/L — ABNORMAL HIGH (ref 0.0–2.0)
Bicarbonate: 40.3 mmol/L — ABNORMAL HIGH (ref 20.0–28.0)
Calcium, Ion: 1.19 mmol/L (ref 1.15–1.40)
HCT: 40 % (ref 36.0–46.0)
Hemoglobin: 13.6 g/dL (ref 12.0–15.0)
O2 Saturation: 97 %
Patient temperature: 98.9
Potassium: 3.8 mmol/L (ref 3.5–5.1)
Sodium: 140 mmol/L (ref 135–145)
TCO2: 42 mmol/L — ABNORMAL HIGH (ref 22–32)
pCO2 arterial: 66.7 mmHg (ref 32–48)
pH, Arterial: 7.39 (ref 7.35–7.45)
pO2, Arterial: 102 mmHg (ref 83–108)

## 2021-05-25 LAB — GLUCOSE, CAPILLARY
Glucose-Capillary: 158 mg/dL — ABNORMAL HIGH (ref 70–99)
Glucose-Capillary: 135 mg/dL — ABNORMAL HIGH (ref 70–99)
Glucose-Capillary: 175 mg/dL — ABNORMAL HIGH (ref 70–99)
Glucose-Capillary: 153 mg/dL — ABNORMAL HIGH (ref 70–99)
Glucose-Capillary: 145 mg/dL — ABNORMAL HIGH (ref 70–99)
Glucose-Capillary: 79 mg/dL (ref 70–99)
Glucose-Capillary: 107 mg/dL — ABNORMAL HIGH (ref 70–99)

## 2021-05-25 LAB — BASIC METABOLIC PANEL
Anion gap: 10 (ref 5–15)
BUN: 21 mg/dL — ABNORMAL HIGH (ref 6–20)
CO2: 37 mmol/L — ABNORMAL HIGH (ref 22–32)
Calcium: 9 mg/dL (ref 8.9–10.3)
Chloride: 92 mmol/L — ABNORMAL LOW (ref 98–111)
Creatinine, Ser: 1.07 mg/dL — ABNORMAL HIGH (ref 0.44–1.00)
GFR, Estimated: >60 mL/min (ref >60)
Glucose, Bld: 147 mg/dL — ABNORMAL HIGH (ref 70–99)
Potassium: 3.4 mmol/L — ABNORMAL LOW (ref 3.5–5.1)
Sodium: 139 mmol/L (ref 135–145)

## 2021-05-25 LAB — CBC
HCT: 42.2 % (ref 36.0–46.0)
Hemoglobin: 13.3 g/dL (ref 12.0–15.0)
MCH: 31.3 pg (ref 26.0–34.0)
MCHC: 31.5 g/dL (ref 30.0–36.0)
MCV: 99.3 fL (ref 80.0–100.0)
Platelets: 194 10*3/uL (ref 150–400)
RBC: 4.25 MIL/uL (ref 3.87–5.11)
RDW: 14.8 % (ref 11.5–15.5)
WBC: 4.5 10*3/uL (ref 4.0–10.5)
nRBC: 0 % (ref 0.0–0.2)

## 2021-05-25 LAB — HEPARIN LEVEL (UNFRACTIONATED): Heparin Unfractionated: 0.5 IU/mL (ref 0.30–0.70)

## 2021-05-25 LAB — COOXEMETRY PANEL
Carboxyhemoglobin: 1.9 % — ABNORMAL HIGH (ref 0.5–1.5)
Methemoglobin: 0.8 % (ref 0.0–1.5)
O2 Saturation: 76.6 %
Total hemoglobin: 13 g/dL (ref 12.0–16.0)

## 2021-05-25 LAB — MAGNESIUM: Magnesium: 2.1 mg/dL (ref 1.7–2.4)

## 2021-05-25 MED ORDER — POTASSIUM CHLORIDE 20 MEQ PO PACK
40.0000 meq | PACK | Freq: Once | ORAL | Status: AC
  Administered 2021-05-25: 40 meq
  Filled 2021-05-25: qty 2

## 2021-05-25 NOTE — Progress Notes (Signed)
[facility] ADULT ICU REPLACEMENT PROTOCOL ? ? ?The patient does apply for the [facility] Adult ICU Electrolyte Replacment Protocol based on the criteria listed below:  ? ?1.Exclusion criteria: TCTS patients, ECMO patients, and Dialysis patients ?2. Is GFR >/= 30 ml/min? Yes.    ?Patient's GFR today is >60 ?3. Is SCr </= 2? Yes.   ?Patient's SCr is 1.07 mg/dL ?4. Did SCr increase >/= 0.5 in 24 hours? No. ?5.Pt's weight >40kg  Yes.   ?6. Abnormal electrolyte(s): K  ?7. Electrolytes replaced per protocol ?8.  Call MD STAT for K+ </= 2.5, Phos </= 1, or Mag </= 1 ?Physician:  Vladimir Faster ? ?[name] E [name] 05/25/2021 4:23 AM  ?

## 2021-05-25 NOTE — Progress Notes (Signed)
? ?NAME:  [name], MRN:  [id number], DOB:  [birth year], LOS: 9 ?ADMISSION DATE:  05/16/2021, CONSULTATION DATE:  5/5 ?REFERRING MD:  Dr. Sabra Heck CHIEF COMPLAINT:  ARF  ? ?History of Present Illness:  ?Patient is a 51 year old female with pertinent PMH of anxiety, depression, chronic pain syndrome on multiple psych meds presents to [facility] on 5/5 with acute respiratory failure. ? ?Patient recently admitted 02/14/2021 with similar episode of respiratory failure with sepsis and aspiration pneumonia and patient was intubated.  Patient left AMA on 02/21/2021.  Patient at home takes multiple psych meds: Xanax, amitriptyline, Lyrica, Trintellix, Ambien, citalopram. ?  ?On 5/5, family went to see patient at home and found unresponsive on floor.  EMS called and patient was unresponsive and sats in 21s.  Patient bagged and transported to [facility] ED.  On arrival to [facility] ED patient GCS very low.  Breathing spontaneously but requiring BVM.  Intubated for hypercarbic hypoxic respiratory failure. ? ?Past Medical History:  ? ? Anxiety    ? Back pain    ? Chronic back pain 02/04/13  ?  By patient report  ? Depression    ? Factor 5 Leiden mutation, heterozygous ([place])    ? Hypertension    ? Laryngitis, chronic    ? Panic attack    ? Scoliosis    ? Ulcer   ? ? ?Significant Hospital Events:  ?5/5 admitted to [facility] intubated for resp failure; transferring to [facility]  ?5/6 slight hypoxia this a.m. on vent requiring increase of PEEP from 5-8 and up titration of FiO2 from 70-100 ?5/7 notified of positive blood cultures for night, antibiotic regiment remains seen with broad coverage.  She is more alert and oriented this a.m. ?5/8 severe LV and RV dysfunction, LVEF 20%.  Persistent hypoxia and hypercarbia.  ?5/9 weaned off paralytics and milnirone  ?5/10 weaned off of epoprostenol  ?5/11 weaned off of levophed  ?5/12 poor synchrony on MV, increasing sedation and restart paralytics. Amiodarone restarted for aflutter. PICC line placed  ?5/13 improved  ventilator synchrony.  Now 5 L net negative.  Improving oxygenation. ? ? ?Antimicrobials:  ?Vancomycin 5/5-5/10 ?Levofloxacin 5/11> ? ?Interim History / Subjective:  ?Hypercarbia corrected on 8 cc/kg.  Oxygenation remained steady overnight.  Patient will open her eyes and squeeze hands to command. ? ?Objective   ?Blood pressure 104/62, pulse (!) 117, temperature 98.9 ?F (37.2 ?C), temperature source Bladder, resp. rate (!) 22, height 5\' 3"  (1.6 m), weight 113 kg, SpO2 98 %. ?   ?Vent Mode: PRVC ?FiO2 (%):  [40 %] 40 % ?Set Rate:  [22 bmp] 22 bmp ?Vt Set:  [420 mL] 420 mL ?PEEP:  [8 cmH20] 8 cmH20 ?Plateau Pressure:  [17 cmH20-18 cmH20] 17 cmH20  ? ?Intake/Output Summary (Last 24 hours) at 05/25/2021 0952 ?Last data filed at 05/25/2021 0911 ?Gross per 24 hour  ?Intake 3491.35 ml  ?Output 7175 ml  ?Net -3683.65 ml  ? ? ?Filed Weights  ? 05/23/21 0500 05/24/21 0459 05/25/21 0453  ?Weight: 117.8 kg 114.9 kg 113 kg  ? ? ?Examination: ?General: Ill-appearing woman, NAD, intubated.  Obese ?HENT: [place]/AT, sclerae anicteric  ?Lungs: CTA bilaterally, acceptable plateau pressure. ?Cardiovascular: regular rhythm, tachycardia, no murmurs rubs or gallops ?Abdomen: Soft, bowel sounds present ?Extremities: 2+ pitting edema bilateral lower extremities, warm and dry edema improving. ?Neuro: opens eyes to verbal stimuli, sedated  ? ?Resolved Hospital Problem list   ?Shock liver ? ?Assessment & Plan:  ?Cor pulmonale, acute ([place]) ?Severe RV dysfunction on  echocardiogram with pressue volume overload.  Developed tachycardia with milrinone. Milrinone off, off epoprostenol.  Follow-up point-of-care echo shows persistent RV dysfunction with last septal dyssynchrony. ?SCV O2 remains normal. ? ?-Continue diuresis.  No inotropic support at this time. ?-Decrease IV diuresis rate as been developing contraction alkalosis ? ?Respiratory failure with hypoxia and hypercapnia (HCC) ?Refractory hypoxia and hypercarbia due to predominantly right-sided  airspace disease and possible superimposed pulmonary edema. ?Improved oxygenation with epoprostenol, diuresis and reduction in PEEP to prevent over distention. ?Tolerated paralytic interruption.  ?Hypercarbia on 6cc/kg with dyssynchrony.  Better on 8 mL/kg with correction hypercarbia.  Airway pressures remain acceptable ? ?-Continue lung protective ventilation targeting plateau pressure less than 30 torr and tidal volume8 mL/kg (6 cc equals 312 mL, 8 cc 416 mL)  ?-Continue to wean PEEP to 5 if possible ?-Follow daily ABG to adjust respiratory rate to maintain normal carbon ?-Continue  weaning sedation, target RASS -1, -2 ? ?ARDS (adult respiratory distress syndrome) ([place]) ?Bilateral airspace disease predominantly on the right side due to right lung aspiration.  Asymmetry limits utility of PEEP. ? ?-Continue lung protective ventilation. ?- Complete course of antibiotics for possible  ? ?Septic shock ([place]) ?Currently requiring low-dose norepinephrine. S.epidermidis .  However, shock may be largely due to RV dysfunction. ? ?-Titrate norepinephrine to keep MAP greater than 65.  Likely offsetting sedation at this point. ?-Off milrinone and epoprostenol ?-Lactate has cleared. ? ?Acute encephalopathy ?Off NMB and eyes opening but not tracking. ? ?-Start weaning sedation, now targeting RASS -1, -2 ? ?HFrEF (heart failure with reduced ejection fraction) ([place]) ?EF 20%, change from echo 2 years ago.  Possibly sepsis induced myocardial dysfunction. ?Likely also component of diastolic dysfunction with restrictive filling from RV pressure volume overload. ? ?-Continue furosemide infusion as finally impacting volume overload.  Ideally would like to see her at least 10 L net negative before transitioning back to intermittent furosemide. ? ?Best practice (evaluated daily)  ?Diet: NPO, tube feeds ?Pain/Anxiety/Delirium protocol (if indicated): fentanyl, propofol, oxy, klonopin ?VAP protocol (if indicated): yes ?DVT prophylaxis:  heparin ?GI prophylaxis: PPI ?Glucose control: none ?Mobility: bedrest ?Disposition:ICU ? ?Goals of Care:  ?Last date of multidisciplinary goals of care discussion:5/12 ?Family and staff present: yes ?Summary of discussion: increased sedation/restart paralytics to improve MV ?Follow up goals of care discussion due: 5/13 ?Code Status: DNR ? ?Labs   ?CBC: ?Recent Labs  ?Lab 05/21/21 ?0625 05/22/21 ?0436 05/22/21 ?[id number] 05/23/21 ?[id number] 05/23/21 ?0448 05/24/21 ?[id number] 05/24/21 ?[id number] 05/24/21 ?1127 05/24/21 ?1631 05/25/21 ?0319  ?WBC 9.6 6.8  --  6.0  --  6.0  --   --   --  4.5  ?HGB 13.4 13.4   < > 12.7   < > 13.1 13.9 13.9 13.9 13.3  ?HCT 40.2 40.6   < > 41.0   < > 40.9 41.0 41.0 41.0 42.2  ?MCV 97.6 98.3  --  102.5*  --  100.0  --   --   --  99.3  ?PLT 157 146*  --  162  --  189  --   --   --  194  ? < > = values in this interval not displayed.  ? ? ? ?Basic Metabolic Panel: ?Recent Labs  ?Lab 05/20/21 ?0334 05/20/21 ?0356 05/21/21 ?0116 05/21/21 ?[id number] 05/21/21 ?[id number] 05/22/21 ?0436 05/22/21 ?[id number] 05/23/21 ?0442 05/23/21 ?0448 05/23/21 ?2009 05/24/21 ?0453 05/24/21 ?0454 05/24/21 ?1127 05/24/21 ?1631 05/25/21 ?0319  ?NA 136   < > 137   < > 137 137   < >  138   < > 138 137 137 138 138 139  ?K 3.2*   < > 3.8   < > 3.7 4.0   < > 3.9   < > 3.1* 3.4* 3.5 4.2 3.6 3.4*  ?CL 94*   < > 99  --  94* 98  --  94*  --  93* 93*  --   --   --  92*  ?CO2 32   < > 32  --  33* 33*  --  36*  --  36* 35*  --   --   --  37*  ?GLUCOSE 152*   < > 141*  --  163* 137*  --  151*  --  128* 144*  --   --   --  147*  ?BUN 18   < > 14  --  13 14  --  16  --  17 18  --   --   --  21*  ?CREATININE 1.02*   < > 0.82  --  0.89 1.01*  --  0.92  --  0.97 0.98  --   --   --  1.07*  ?CALCIUM 7.7*   < > 7.8*  --  8.1* 8.2*  --  8.2*  --  8.5* 8.4*  --   --   --  9.0  ?MG 1.5*  --  1.8  --  2.1  --   --   --   --  1.7  --   --   --   --  2.1  ? < > = values in this interval not displayed.  ? ? ?GFR: ?Estimated Creatinine Clearance: 76.1 mL/min (A) (by C-G formula  based on SCr of 1.07 mg/dL (H)). ?Recent Labs  ?Lab 05/19/21 ?1024 05/19/21 ?1329 05/20/21 ?[id number] 05/22/21 ?[id number] 05/23/21 ?[id number] 05/24/21 ?0453 05/25/21 ?0319  ?WBC  --   --    < > 6.8 6.0 6.0 4.5  ?LATICACIDVEN 1.5 1.4  -

## 2021-05-25 NOTE — Progress Notes (Signed)
ANTICOAGULATION CONSULT NOTE ? ?Pharmacy Consult:  Heparin ?Indication: atrial fibrillation ? ?Allergies  ?Allergen Reactions  ? Penicillins Anaphylaxis  ?  Has patient had a PCN reaction causing immediate rash, facial/tongue/throat swelling, SOB or lightheadedness with hypotension: No ?Has patient had a PCN reaction causing severe rash involving mucus membranes or skin necrosis: No ?Has patient had a PCN reaction that required hospitalization No ?Has patient had a PCN reaction occurring within the last 10 years: No ?If all of the above answers are "NO", then may proceed with Cephalosporin use. ?  ? Ibuprofen Nausea And Vomiting  ?  GI upset, Shakes  ? Peppermint Flavor [Flavoring Agent]   ?  Allergic to pepper the spice  ? Aspirin Nausea And Vomiting  ?  Gi upset  ? ? ?Patient Measurements: ?Height: 5\' 3"  (160 cm) ?Weight: 113 kg (249 lb 1.9 oz) ?IBW/kg (Calculated) : 52.4 ?Heparin Dosing Weight: 74 kg ? ?Vital Signs: ?BP: 104/62 (05/14 0746) ?Pulse Rate: 117 (05/14 0900) ? ?Labs: ?Recent Labs  ?  05/23/21 ?[id number] 05/23/21 ?0448 05/23/21 ?1608 05/23/21 ?1806 05/23/21 ?2009 05/24/21 ?0453 05/24/21 ?0454 05/24/21 ?1127 05/24/21 ?1631 05/25/21 ?0319  ?HGB 12.7   < >  --    < >  --  13.1   < > 13.9 13.9 13.3  ?HCT 41.0   < >  --    < >  --  40.9   < > 41.0 41.0 42.2  ?PLT 162  --   --   --   --  189  --   --   --  194  ?HEPARINUNFRC  --   --  0.35  --   --  0.45  --   --   --  0.50  ?CREATININE 0.92  --   --   --  0.97 0.98  --   --   --  1.07*  ? < > = values in this interval not displayed.  ? ? ? ?Estimated Creatinine Clearance: 76.1 mL/min (A) (by C-G formula based on SCr of 1.07 mg/dL (H)). ? ? ?Assessment: ?33 YOF initially started on IV heparin for rule out PE, then switched to heparin SQ for VTE prophylaxis.  Now with persistent Afib (CHADSVASC 3), so Pharmacy consulted to resume IV heparin. ? ?-heparin level at goal on 1600 units/hr ? ?Goal of Therapy:  ?Heparin level 0.3-0.7 units/ml ?Monitor platelets by  anticoagulation protocol: Yes ?  ?Plan:  ?-Continue heparin at 1600 units/hr ?-Daily heparin level and CBC ?-f/u transition to [place] ? ?Thank you for allowing pharmacy to be a part of this patient?s care. ? ?Donnald Garre, PharmD ?Clinical Pharmacist ? ?Please check AMION for all [place] numbers ?After 10:00 PM, call [place] [phone] ? ? ? ?

## 2021-05-26 ENCOUNTER — Inpatient Hospital Stay (HOSPITAL_COMMUNITY): Payer: Medicaid Other

## 2021-05-26 DIAGNOSIS — J9602 Acute respiratory failure with hypercapnia: Secondary | ICD-10-CM | POA: Diagnosis not present

## 2021-05-26 DIAGNOSIS — J9601 Acute respiratory failure with hypoxia: Secondary | ICD-10-CM | POA: Diagnosis not present

## 2021-05-26 DIAGNOSIS — I483 Typical atrial flutter: Secondary | ICD-10-CM | POA: Diagnosis not present

## 2021-05-26 DIAGNOSIS — Z66 Do not resuscitate: Secondary | ICD-10-CM | POA: Diagnosis not present

## 2021-05-26 DIAGNOSIS — J690 Pneumonitis due to inhalation of food and vomit: Secondary | ICD-10-CM | POA: Diagnosis not present

## 2021-05-26 DIAGNOSIS — R0603 Acute respiratory distress: Secondary | ICD-10-CM

## 2021-05-26 DIAGNOSIS — I5043 Acute on chronic combined systolic (congestive) and diastolic (congestive) heart failure: Secondary | ICD-10-CM | POA: Diagnosis not present

## 2021-05-26 DIAGNOSIS — J154 Pneumonia due to other streptococci: Secondary | ICD-10-CM | POA: Diagnosis not present

## 2021-05-26 DIAGNOSIS — G928 Other toxic encephalopathy: Secondary | ICD-10-CM | POA: Diagnosis not present

## 2021-05-26 DIAGNOSIS — K7200 Acute and subacute hepatic failure without coma: Secondary | ICD-10-CM | POA: Diagnosis not present

## 2021-05-26 DIAGNOSIS — J80 Acute respiratory distress syndrome: Secondary | ICD-10-CM | POA: Diagnosis not present

## 2021-05-26 DIAGNOSIS — R6521 Severe sepsis with septic shock: Secondary | ICD-10-CM | POA: Diagnosis not present

## 2021-05-26 DIAGNOSIS — J15211 Pneumonia due to Methicillin susceptible Staphylococcus aureus: Secondary | ICD-10-CM | POA: Diagnosis not present

## 2021-05-26 DIAGNOSIS — Z1152 Encounter for screening for COVID-19: Secondary | ICD-10-CM | POA: Diagnosis not present

## 2021-05-26 DIAGNOSIS — A403 Sepsis due to Streptococcus pneumoniae: Secondary | ICD-10-CM | POA: Diagnosis not present

## 2021-05-26 DIAGNOSIS — R570 Cardiogenic shock: Secondary | ICD-10-CM | POA: Diagnosis not present

## 2021-05-26 LAB — CBC
HCT: 43.6 % (ref 36.0–46.0)
Hemoglobin: 14.3 g/dL (ref 12.0–15.0)
MCH: 31.9 pg (ref 26.0–34.0)
MCHC: 32.8 g/dL (ref 30.0–36.0)
MCV: 97.3 fL (ref 80.0–100.0)
Platelets: 198 10*3/uL (ref 150–400)
RBC: 4.48 MIL/uL (ref 3.87–5.11)
RDW: 14.6 % (ref 11.5–15.5)
WBC: 5.8 10*3/uL (ref 4.0–10.5)
nRBC: 0 % (ref 0.0–0.2)

## 2021-05-26 LAB — ECHOCARDIOGRAM LIMITED
AR max vel: 2.48 cm2
AV Peak grad: 4 mmHg
Ao pk vel: 1 m/s
Area-P 1/2: 9.14 cm2
Calc EF: 22 %
Height: 63 in
S' Lateral: 5 cm
Single Plane A2C EF: 20.7 %
Single Plane A4C EF: 22.7 %
Weight: 3873.04 oz

## 2021-05-26 LAB — BASIC METABOLIC PANEL
Anion gap: 9 (ref 5–15)
BUN: 22 mg/dL — ABNORMAL HIGH (ref 6–20)
CO2: 35 mmol/L — ABNORMAL HIGH (ref 22–32)
Calcium: 9.4 mg/dL (ref 8.9–10.3)
Chloride: 95 mmol/L — ABNORMAL LOW (ref 98–111)
Creatinine, Ser: 0.95 mg/dL (ref 0.44–1.00)
GFR, Estimated: >60 mL/min (ref >60)
Glucose, Bld: 140 mg/dL — ABNORMAL HIGH (ref 70–99)
Potassium: 3.4 mmol/L — ABNORMAL LOW (ref 3.5–5.1)
Sodium: 139 mmol/L (ref 135–145)

## 2021-05-26 LAB — POCT I-STAT 7, (LYTES, BLD GAS, ICA,H+H)
Acid-Base Excess: 11 mmol/L — ABNORMAL HIGH (ref 0.0–2.0)
Bicarbonate: 37.4 mmol/L — ABNORMAL HIGH (ref 20.0–28.0)
Calcium, Ion: 1.2 mmol/L (ref 1.15–1.40)
HCT: 43 % (ref 36.0–46.0)
Hemoglobin: 14.6 g/dL (ref 12.0–15.0)
O2 Saturation: 93 %
Patient temperature: 98.6
Potassium: 3.4 mmol/L — ABNORMAL LOW (ref 3.5–5.1)
Sodium: 139 mmol/L (ref 135–145)
TCO2: 39 mmol/L — ABNORMAL HIGH (ref 22–32)
pCO2 arterial: 55.2 mmHg — ABNORMAL HIGH (ref 32–48)
pH, Arterial: 7.439 (ref 7.35–7.45)
pO2, Arterial: 66 mmHg — ABNORMAL LOW (ref 83–108)

## 2021-05-26 LAB — COOXEMETRY PANEL
Carboxyhemoglobin: 1.7 % — ABNORMAL HIGH (ref 0.5–1.5)
Methemoglobin: 0.8 % (ref 0.0–1.5)
O2 Saturation: 75.7 %
Total hemoglobin: 14.5 g/dL (ref 12.0–16.0)

## 2021-05-26 LAB — MAGNESIUM: Magnesium: 2.2 mg/dL (ref 1.7–2.4)

## 2021-05-26 LAB — GLUCOSE, CAPILLARY
Glucose-Capillary: 139 mg/dL — ABNORMAL HIGH (ref 70–99)
Glucose-Capillary: 135 mg/dL — ABNORMAL HIGH (ref 70–99)
Glucose-Capillary: 130 mg/dL — ABNORMAL HIGH (ref 70–99)
Glucose-Capillary: 155 mg/dL — ABNORMAL HIGH (ref 70–99)
Glucose-Capillary: 128 mg/dL — ABNORMAL HIGH (ref 70–99)
Glucose-Capillary: 143 mg/dL — ABNORMAL HIGH (ref 70–99)

## 2021-05-26 LAB — HEPARIN LEVEL (UNFRACTIONATED): Heparin Unfractionated: 0.29 IU/mL — ABNORMAL LOW (ref 0.30–0.70)

## 2021-05-26 LAB — TRIGLYCERIDES: Triglycerides: 303 mg/dL — ABNORMAL HIGH (ref <150)

## 2021-05-26 MED ORDER — POTASSIUM CHLORIDE 20 MEQ PO PACK
40.0000 meq | PACK | Freq: Three times a day (TID) | ORAL | Status: DC
  Administered 2021-05-26 – 2021-05-27 (×5): 40 meq
  Filled 2021-05-26 (×5): qty 2

## 2021-05-26 MED ORDER — POTASSIUM CHLORIDE 20 MEQ PO PACK
20.0000 meq | PACK | Freq: Once | ORAL | Status: AC
  Administered 2021-05-26: 20 meq
  Filled 2021-05-26: qty 1

## 2021-05-26 MED ORDER — POTASSIUM CHLORIDE 20 MEQ PO PACK
40.0000 meq | PACK | Freq: Once | ORAL | Status: AC
  Administered 2021-05-26: 40 meq
  Filled 2021-05-26: qty 2

## 2021-05-26 MED ORDER — AMIODARONE HCL IN DEXTROSE 360-4.14 MG/200ML-% IV SOLN
INTRAVENOUS | Status: AC
  Administered 2021-05-26: 60 mg/h via INTRAVENOUS
  Filled 2021-05-26: qty 200

## 2021-05-26 MED ORDER — POTASSIUM CHLORIDE CRYS ER 20 MEQ PO TBCR
40.0000 meq | EXTENDED_RELEASE_TABLET | Freq: Two times a day (BID) | ORAL | Status: DC
Start: 2021-05-26 — End: 2021-05-26

## 2021-05-26 MED ORDER — AMIODARONE HCL IN DEXTROSE 360-4.14 MG/200ML-% IV SOLN
60.0000 mg/h | INTRAVENOUS | Status: DC
Start: 2021-05-26 — End: 2021-05-31
  Administered 2021-05-26 – 2021-05-28 (×5): 30 mg/h via INTRAVENOUS
  Administered 2021-05-29 (×2): 60 mg/h via INTRAVENOUS
  Administered 2021-05-29: 30 mg/h via INTRAVENOUS
  Administered 2021-05-30 – 2021-05-31 (×5): 60 mg/h via INTRAVENOUS
  Filled 2021-05-26 (×13): qty 200

## 2021-05-26 MED ORDER — PERFLUTREN LIPID MICROSPHERE
1.0000 mL | INTRAVENOUS | Status: AC | PRN
  Administered 2021-05-26: 2 mL via INTRAVENOUS

## 2021-05-26 MED ORDER — AMIODARONE HCL IN DEXTROSE 360-4.14 MG/200ML-% IV SOLN
60.0000 mg/h | INTRAVENOUS | Status: AC
Start: 2021-05-26 — End: 2021-05-26
  Administered 2021-05-26: 60 mg/h via INTRAVENOUS

## 2021-05-26 NOTE — Consult Note (Addendum)
Advanced Heart Failure Team Consult Note   Primary Physician: Gareth Morgan, MD PCP-Cardiologist:  None  Reason for Consultation: Acute Biventricular Heart Failure   HPI:    [name] is seen today for evaluation of A/C Biventricular at the request of Dr Isaiah Serge.   [name] is a 51 year old with a h/o chronic pain, anxiety, depression, and substance abuse.    Echo 2017 EF 55-60%   Admitted 2/20223 with sepsis, aspiration pneumonia, & respiratory failure. Required intubation. Left AMA on 02/21/2021.   On 5/5 she was found unresponsive by her family. Presented to [facility] via EMS unresponsive and respiratory failure. Intubated. Initial lactic acid 2.7.  UDS + benzos & meth. CT head negative. Transferred to [facility] for shock management. Initially on inotropes/pressors but weaned off. HS Trop 64>55 .Echo - severe biventricular heart failure - LV < 20% RV severely reduced, bi-atrial enlargement. Developed A flutter. Started on amio + heparin drip. Amio drip stopped 05/25/21. Diuresing with IV lasix. CVP 14-15. Remains intubated.   Cultures Sputum - Streptococcus Pneumonia , Staphylococcus Aureus. Blood -  1/2 staphylococcus epidermidis  Urine - no growth   CO-OX 76%.   Review of Systems: [y] = yes, [ ]  = no Patient is encephalopathic and or intubated. Therefore history has been obtained from chart review.    General: Weight gain [ ] ; Weight loss [ ] ; Anorexia [ ] ; Fatigue [ ] ; Fever [ ] ; Chills [ ] ; Weakness [ ]   Cardiac: Chest pain/pressure [ ] ; Resting SOB [ ] ; Exertional SOB [ ] ; Orthopnea [ ] ; Pedal Edema [ ] ; Palpitations [ ] ; Syncope [ ] ; Presyncope [ ] ; Paroxysmal nocturnal dyspnea[ ]   Pulmonary: Cough [ ] ; Wheezing[ ] ; Hemoptysis[ ] ; Sputum [ ] ; Snoring [ ]   GI: Vomiting[ ] ; Dysphagia[ ] ; Melena[ ] ; Hematochezia [ ] ; Heartburn[ ] ; Abdominal pain [ ] ; Constipation [ ] ; Diarrhea [ ] ; BRBPR [ ]   GU: Hematuria[ ] ; Dysuria [ ] ; Nocturia[ ]   Vascular: Pain in legs with walking [ ] ;  Pain in feet with lying flat [ ] ; Non-healing sores [ ] ; Stroke [ ] ; TIA [ ] ; Slurred speech [ ] ;  Neuro: Headaches[ ] ; Vertigo[ ] ; Seizures[ ] ; Paresthesias[ ] ;Blurred vision [ ] ; Diplopia [ ] ; Vision changes [ ]   Ortho/Skin: Arthritis [ ] ; Joint pain [ ] ; Muscle pain [ ] ; Joint swelling [ ] ; Back Pain [ Y]; Rash [ ]   Psych: Depression[ ] ; Anxiety[ ]   Heme: Bleeding problems [ ] ; Clotting disorders [ ] ; Anemia [ ]   Endocrine: Diabetes [ ] ; Thyroid dysfunction[ ]   Home Medications Prior to Admission medications   Medication Sig Start Date End Date Taking? Authorizing Provider  albuterol (PROVENTIL HFA;VENTOLIN HFA) 108 (90 BASE) MCG/ACT inhaler Inhale 2 puffs into the lungs every 4 (four) hours as needed for wheezing or shortness of breath. 05/03/14  Yes Ward, Layla Maw, DO  ALPRAZolam (XANAX) 0.5 MG tablet Take 0.5 mg by mouth 2 (two) times daily as needed for anxiety. 02/03/21  Yes [provider]  amitriptyline (ELAVIL) 25 MG tablet Take 25 mg by mouth at bedtime. Takes with 50mg  tablet for a total dose of 75mg  at bedtime 02/08/20  Yes [provider]  amitriptyline (ELAVIL) 50 MG tablet Take 50 mg by mouth at bedtime. Takes with 25mg  tablet for a total dose of 75mg  at bedtime 02/03/21  Yes [provider]  aspirin EC 81 MG EC tablet Take 1 tablet (81 mg total) by mouth daily. 12/08/15  Yes Raymond Gurney, PA-C  aspirin-sod bicarb-citric acid (ALKA-SELTZER) 325 MG TBEF tablet Take 325 mg by mouth every 6 (six) hours as needed (For headache).   Yes [provider]  atorvastatin (LIPITOR) 20 MG tablet Take 20 mg by mouth daily.   Yes [provider]  bismuth subsalicylate (PEPTO BISMOL) 262 MG chewable tablet Chew 524 mg by mouth 3 (three) times daily as needed for indigestion.   Yes [provider]  celecoxib (CELEBREX) 200 MG capsule Take 200 mg by mouth 2 (two) times daily. 12/26/19  Yes [provider]  citalopram (CELEXA) 20 MG  tablet Take 20 mg by mouth at bedtime. 01/17/21  Yes [provider]  fluticasone (FLONASE) 50 MCG/ACT nasal spray Place 1 spray into both nostrils at bedtime.   Yes [provider]  hydrochlorothiazide (HYDRODIURIL) 12.5 MG tablet Take 12.5 mg by mouth daily. 12/18/19  Yes [provider]  ketoconazole (NIZORAL) 2 % shampoo Apply 1 application topically 2 (two) times a week. 01/28/21  Yes [provider]  lisinopril (PRINIVIL,ZESTRIL) 20 MG tablet Take 1 tablet (20 mg total) by mouth daily. 12/14/15  Yes Meredeth Ide, MD  magnesium oxide (MAG-OX) 400 MG tablet Take 400 mg by mouth daily.   Yes [provider]  methocarbamol (ROBAXIN) 750 MG tablet Take 750 mg by mouth every 6 (six) hours as needed for muscle spasms.   Yes [provider]  ondansetron (ZOFRAN ODT) 4 MG disintegrating tablet Take 1 tablet (4 mg total) by mouth every 8 (eight) hours as needed for nausea or vomiting. 02/17/20  Yes Aberman, Caroline C, PA-C  pantoprazole (PROTONIX) 40 MG tablet Take 40 mg by mouth daily.   Yes [provider]  potassium chloride SA (K-DUR,KLOR-CON) 20 MEQ tablet Take 1 tablet (20 mEq total) by mouth daily. 09/02/16 05/17/21 Yes Tat, Onalee Hua, MD  pregabalin (LYRICA) 200 MG capsule Take 200 mg by mouth in the morning, at noon, and at bedtime. 01/04/20  Yes [provider]  sucralfate (CARAFATE) 1 g tablet Take 1 tablet (1 g total) by mouth 2 (two) times daily. 02/17/20  Yes Aberman, Caroline C, PA-C  TRINTELLIX 10 MG TABS tablet Take 10 mg by mouth daily. 01/01/20  Yes [provider]  Vitamin D, Ergocalciferol, (DRISDOL) 1.25 MG (50000 UNIT) CAPS capsule Take 50,000 Units by mouth 2 (two) times a week. 02/16/20  Yes [provider]  VYVANSE 50 MG capsule Take 50 mg by mouth every morning. 01/22/20  Yes [provider]  zolpidem (AMBIEN) 10 MG tablet Take 10 mg by mouth at bedtime. 01/22/20  Yes [provider]   ALPRAZolam Prudy Feeler) 1 MG tablet Take 1 tablet (1 mg total) by mouth 4 (four) times daily. Patient not taking: Reported on 02/14/2021 12/07/15   Raymond Gurney, PA-C  NON FORMULARY NERVIVE (nerve relief)    [provider]    Past Medical History: Past Medical History:  Diagnosis Date   Anxiety    Back pain    Chronic back pain 02/04/13   By patient report   Depression    Factor 5 Leiden mutation, heterozygous (HCC)    Hypertension    Laryngitis, chronic    Panic attack    Scoliosis    Ulcer     Past Surgical History: Past Surgical History:  Procedure Laterality Date   BACK SURGERY     CESAREAN SECTION     CHOLECYSTECTOMY  11/27/2011   Procedure: LAPAROSCOPIC CHOLECYSTECTOMY;  Surgeon: Fabio Bering, MD;  Location: [facility];  Service: General;  Laterality: N/A;   CRYOTHERAPY  02/17/2021   Procedure: CRYOTHERAPY;  Surgeon: Lorin Glass, MD;  Location: [facility] ENDOSCOPY;  Service: Pulmonary;;   EMBOLECTOMY Left 12/06/2015   Procedure: EMBOLECTOMY LEFT ARM;  Surgeon: Sherren Kerns, MD;  Location: [facility] OR;  Service: Vascular;  Laterality: Left;   NECK SURGERY     TUBAL LIGATION     VIDEO BRONCHOSCOPY Right 02/17/2021   Procedure: VIDEO BRONCHOSCOPY WITHOUT FLUORO;  Surgeon: Lorin Glass, MD;  Location: [facility] ENDOSCOPY;  Service: Pulmonary;  Laterality: Right;  with cryo/ basket    Family History: Family History  Problem Relation Age of Onset   Heart disease Other    Arthritis Other    Lung disease Other    Cancer Other    Asthma Other    Diabetes Other     Social History: Social History   Socioeconomic History   Marital status: Divorced    Spouse name: Not on file   Number of children: Not on file   Years of education: Not on file   Highest education level: Not on file  Occupational History   Not on file  Tobacco Use   Smoking status: Every Day    Packs/day: 1.50    Years: 25.00    Pack years: 37.50    Types: Cigarettes   Smokeless tobacco: Never   Substance and Sexual Activity   Alcohol use: No   Drug use: No   Sexual activity: Yes    Birth control/protection: Surgical  Other Topics Concern   Not on file  Social History Narrative   Not on file   Social Determinants of Health   Financial Resource Strain: Not on file  Food Insecurity: Not on file  Transportation Needs: Not on file  Physical Activity: Not on file  Stress: Not on file  Social Connections: Not on file    Allergies:  Allergies  Allergen Reactions   Penicillins Anaphylaxis    Has patient had a PCN reaction causing immediate rash, facial/tongue/throat swelling, SOB or lightheadedness with hypotension: No Has patient had a PCN reaction causing severe rash involving mucus membranes or skin necrosis: No Has patient had a PCN reaction that required hospitalization No Has patient had a PCN reaction occurring within the last 10 years: No If all of the above answers are "NO", then may proceed with Cephalosporin use.    Ibuprofen Nausea And Vomiting    GI upset, Shakes   Peppermint Flavor [Flavoring Agent]     Allergic to pepper the spice   Aspirin Nausea And Vomiting    Gi upset    Objective:    Vital Signs:   Temp:  [99.2 F (37.3 C)-99.9 F (37.7 C)] 99.2 F (37.3 C) (05/15 0733) Pulse Rate:  [50-128] 125 (05/15 1131) Resp:  [16-27] 24 (05/15 1131) BP: (100-124)/(55-76) 106/68 (05/15 1131) SpO2:  [92 %-100 %] 99 % (05/15 1132) Arterial Line BP: (86-161)/(45-93) 118/65 (05/15 1000) FiO2 (%):  [40 %] 40 % (05/15 1132) Weight:  [109.8 kg] 109.8 kg (05/15 0500) Last BM Date : 05/26/21  Weight change: Filed Weights   05/24/21 0459 05/25/21 0453 05/26/21 0500  Weight: 114.9 kg 113 kg 109.8 kg    Intake/Output:   Intake/Output Summary (Last 24 hours) at 05/26/2021 1158 Last data filed at 05/26/2021 1000 Gross per 24 hour  Intake 2671.99 ml  Output 4905 ml  Net -2233.01 ml  Physical Exam   CVP -14-15  General:  Intubated  HEENT:  normal Neck: supple. JVP 11-12. Carotids 2+ bilat; no bruits. No lymphadenopathy or thyromegaly appreciated. Cor: PMI nondisplaced. Tachy Regular rate & rhythm. No rubs, gallops or murmurs. Lungs: clear Abdomen: soft, nontender, nondistended. No hepatosplenomegaly. No bruits or masses. Good bowel sounds. Extremities: cool. no cyanosis, clubbing, rash, edema. RUE PICC Neuro: Intubated opens eyes. Does not follow commands.   Telemetry  Tachy 120s. Looks like A flutter today  Obtaining EKG  EKG   A flutter on admitting EKG 5/5 Labs   Basic Metabolic Panel: Recent Labs  Lab 05/21/21 0116 05/21/21 0341 05/21/21 0625 05/22/21 0436 05/23/21 0442 05/23/21 0448 05/23/21 2009 05/24/21 0453 05/24/21 0454 05/24/21 1631 05/25/21 0319 05/25/21 1052 05/26/21 0315 05/26/21 0359  NA 137   < > 137   < > 138   < > 138 137   < > 138 139 140 139 139  K 3.8   < > 3.7   < > 3.9   < > 3.1* 3.4*   < > 3.6 3.4* 3.8 3.4* 3.4*  CL 99  --  94*   < > 94*  --  93* 93*  --   --  92*  --  95*  --   CO2 32  --  33*   < > 36*  --  36* 35*  --   --  37*  --  35*  --   GLUCOSE 141*  --  163*   < > 151*  --  128* 144*  --   --  147*  --  140*  --   BUN 14  --  13   < > 16  --  17 18  --   --  21*  --  22*  --   CREATININE 0.82  --  0.89   < > 0.92  --  0.97 0.98  --   --  1.07*  --  0.95  --   CALCIUM 7.8*  --  8.1*   < > 8.2*  --  8.5* 8.4*  --   --  9.0  --  9.4  --   MG 1.8  --  2.1  --   --   --  1.7  --   --   --  2.1  --  2.2  --    < > = values in this interval not displayed.    Liver Function Tests: Recent Labs  Lab 05/20/21 0334 05/21/21 0625 05/22/21 0436 05/23/21 0442 05/24/21 0453  AST 18 17 17 20 19   ALT 20 18 16 16 16   ALKPHOS 95 117 116 119 124  BILITOT 1.0 0.7 0.7 0.9 0.6  PROT 5.2* 5.4* 5.7* 6.0* 5.8*  ALBUMIN 2.3* 2.2* 2.2* 2.3* 2.3*   No results for input(s): LIPASE, AMYLASE in the last 168 hours. No results for input(s): AMMONIA in the last 168 hours.  CBC: Recent Labs   Lab 05/22/21 0436 05/22/21 0449 05/23/21 0442 05/23/21 0448 05/24/21 0453 05/24/21 0454 05/24/21 1631 05/25/21 0319 05/25/21 1052 05/26/21 0315 05/26/21 0359  WBC 6.8  --  6.0  --  6.0  --   --  4.5  --  5.8  --   HGB 13.4   < > 12.7   < > 13.1   < > 13.9 13.3 13.6 14.3 14.6  HCT 40.6   < > 41.0   < > 40.9   < >  41.0 42.2 40.0 43.6 43.0  MCV 98.3  --  102.5*  --  100.0  --   --  99.3  --  97.3  --   PLT 146*  --  162  --  189  --   --  194  --  198  --    < > = values in this interval not displayed.    Cardiac Enzymes: No results for input(s): CKTOTAL, CKMB, CKMBINDEX, TROPONINI in the last 168 hours.  BNP: BNP (last 3 results) Recent Labs    02/14/21 0742 02/14/21 2153 05/17/21 0415  BNP 309.0* 162.0* 782.6*    ProBNP (last 3 results) No results for input(s): PROBNP in the last 8760 hours.   CBG: Recent Labs  Lab 05/25/21 1539 05/25/21 1938 05/25/21 2307 05/26/21 0320 05/26/21 0823  GLUCAP 145* 79 107* 139* 135*    Coagulation Studies: No results for input(s): LABPROT, INR in the last 72 hours.   Imaging   No results found.   Medications:     Current Medications:  arformoterol  15 mcg Nebulization BID   aspirin  81 mg Per Tube Daily   atorvastatin  20 mg Per Tube Daily   budesonide (PULMICORT) nebulizer solution  0.25 mg Nebulization BID   chlorhexidine gluconate (MEDLINE KIT)  15 mL Mouth Rinse BID   Chlorhexidine Gluconate Cloth  6 each Topical Q0600   clonazePAM  1 mg Per Tube BID   feeding supplement (PROSource TF)  45 mL Per Tube BID   insulin aspart  0-9 Units Subcutaneous Q4H   levofloxacin  750 mg Per Tube q1800   mouth rinse  15 mL Mouth Rinse 10 times per day   oxyCODONE  5 mg Per Tube Q6H   pantoprazole sodium  40 mg Per Tube Daily   polyethylene glycol  17 g Per Tube Daily   potassium chloride  40 mEq Per Tube BID   QUEtiapine  50 mg Per Tube BID   revefenacin  175 mcg Nebulization Daily   senna-docusate  1 tablet Per Tube  BID   sodium chloride flush  10-40 mL Intracatheter Q12H    Infusions:  feeding supplement (VITAL 1.5 CAL) 1,000 mL (05/25/21 2004)   fentaNYL infusion INTRAVENOUS 250 mcg/hr (05/26/21 1000)   furosemide (LASIX) 200 mg in dextrose 5% 100 mL (2mg /mL) infusion 4 mg/hr (05/26/21 1000)   heparin 1,700 Units/hr (05/26/21 1000)   norepinephrine (LEVOPHED) Adult infusion Stopped (05/25/21 1739)   propofol (DIPRIVAN) infusion 40 mcg/kg/min (05/26/21 1000)      Patient Profile    Danielle Cassata is a 51 year old with a h/o chronic pain, anxiety, depression, and substance abuse.    Admitted with respiratory failure + A flutter.   Assessment/Plan  1. Acute Respiratory Failure,  - CAP possible pulmonary edema.  Intubated on admit. Per CCM   2. Shock, septic +/- cardiogenic - Intitial lactic acid 2.7--->1.4  - Sputum Streptococcus Pneumonia , Staphylococcus Aureus. - Blood -  1/2 staphylococcus epidermidis  - Urine no growth.  - Initially required pressors now off.  - Antibiotics narrowed. Now on levaquin.  - CO-OX stable off intrope/pressors.   3. Acute Biventricular Heart Failure She had an ECHO in 2017 EF 55-60%. ECHO this admit severe biventricular HF with EF < 20% RV severely reduced. -HS Trop 64>55.  Unclear etiology. ? Viral. Tachy mediated?  - Ideally would obtain RHC/LHC + CMRI but will need to follow for recovery.  - CVP 14-15. Continue to diurese  with IV lasix. Increase to 8 mg per hour' - No room for GDMT yet.   4. A flutter, new onset -In A flutter on 05/16/21.  Had been on amio drip but stopped on 5/14.  - Looks like she is still in A flutter. Check EKG  now.  - Will need to restart amio drip.    - Continue heparin drip.   5. Polysubstance Abuse UDS + Meth   5. GOC  DNR  Length of Stay: 10  Tonye Becket, NP  05/26/2021, 11:58 AM  Advanced Heart Failure Team Pager [phone] (M-F; 7a - 5p)  Please contact [facility] for night-coverage after hours (4p -7a ) and  weekends on [email or web address]  Patient seen with NP, agree with the above note.    Patient has been admitted since 5/5 after found unresponsive at home and hypoxemic.  She was intubated.  UDS positive for benzos and methamphetamine.  Suspected PNA with Strep pneumo and Staph aureus in sputum, remains on levofloxacin.  She has been in atrial flutter with mild RVR since admission, current HR 100s-110s. She was in atrial flutter last admission as well.  She has central line, co-ox 76% with CVP 14-15.    Echo showed LV EF < 20% with moderate LV dilation, severely decreased RV systolic function, moderate TR, normal IVC.   She is wake on vent today, can follow commands.   General: NAD Neck: JVP 12 cm, no thyromegaly or thyroid nodule.  Lungs: Clear to auscultation bilaterally with normal respiratory effort. CV: Nondisplaced PMI.  Heart tachy, regular S1/S2, no S3/S4, no murmur. Trace ankle edema.  Abdomen: Soft, nontender, no hepatosplenomegaly, no distention.  Skin: Intact without lesions or rashes.  Neurologic: Alert and oriented x 3.  Psych: Normal affect. Extremities: No clubbing or cyanosis.  HEENT: Normal.   1. Acute hypoxemic respiratory failure: Suspect aspiration in setting of drug abuse (benzos/meth on UDS).  Has PNA + likely pulmonary edema.  Sputum with Strep pneumo and S aureus.  - Continue levofloxacin - vent weaning per CCM.  - Continue diuresis.  2. Acute systolic CHF: No prior history of CHF.  Echo this admission showed LV EF < 20% with moderate LV dilation, severely decreased RV systolic function, moderate TR, normal IVC.  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  Also consider stress cardiomyopathy from sepsis/PNA.  Cannot rule out CAD.  Co-ox good at 76%, CVP elevated 14-15.  Creatinine stable 0.95.  - Continue Lasix gtt, increase to 8 mg/hr and replace K.  - After patient is diuresed, will need TEE-guided DCCV (see below).  -  Ideally, would have right/left heart cath. Will need to decide on timing.  - Add other GDMT as BP tolerates.  3. Atrial flutter: She appears to be in typical AFL.  AFL has been present since at least 2/23.  Concerned for component of tachy-mediated CMP.   - Restart amiodarone gtt.  - Heparin gtt.  - Eventual TEE-guided DCCV, would get her diuresed first.  - Would ideally have ablation down the road.  4. Substance abuse: UDS with methamphetamines.   Marca Ancona 05/26/2021 3:42 PM

## 2021-05-26 NOTE — Progress Notes (Addendum)
ANTICOAGULATION CONSULT NOTE ? ?Pharmacy Consult:  Heparin ?Indication: atrial fibrillation ? ?Allergies  ?Allergen Reactions  ? Penicillins Anaphylaxis  ?  Has patient had a PCN reaction causing immediate rash, facial/tongue/throat swelling, SOB or lightheadedness with hypotension: No ?Has patient had a PCN reaction causing severe rash involving mucus membranes or skin necrosis: No ?Has patient had a PCN reaction that required hospitalization No ?Has patient had a PCN reaction occurring within the last 10 years: No ?If all of the above answers are "NO", then may proceed with Cephalosporin use. ?  ? Ibuprofen Nausea And Vomiting  ?  GI upset, Shakes  ? Peppermint Flavor [Flavoring Agent]   ?  Allergic to pepper the spice  ? Aspirin Nausea And Vomiting  ?  Gi upset  ? ? ?Patient Measurements: ?Height: 5\' 3"  (160 cm) ?Weight: 109.8 kg (242 lb 1 oz) ?IBW/kg (Calculated) : 52.4 ?Heparin Dosing Weight: 74 kg ? ?Vital Signs: ?Temp: 99.5 ?F (37.5 ?C) (05/15 0349) ?Temp Source: Bladder (05/15 0349) ?BP: 116/63 (05/15 0726) ?Pulse Rate: 125 (05/15 0726) ? ?Labs: ?Recent Labs  ?  05/24/21 ?0453 05/24/21 ?0454 05/25/21 ?[id number] 05/25/21 ?1052 05/26/21 ?[id number] 05/26/21 ?0359  ?HGB 13.1   < > 13.3 13.6 14.3 14.6  ?HCT 40.9   < > 42.2 40.0 43.6 43.0  ?PLT 189  --  194  --  198  --   ?HEPARINUNFRC 0.45  --  0.50  --  0.29*  --   ?CREATININE 0.98  --  1.07*  --  0.95  --   ? < > = values in this interval not displayed.  ? ? ? ?Estimated Creatinine Clearance: 84.3 mL/min (by C-G formula based on SCr of 0.95 mg/dL). ? ? ?Assessment: ?72 YOF initially started on IV heparin for rule out PE, then switched to heparin SQ for VTE prophylaxis.  Now with persistent Afib (CHADSVASC 3), so Pharmacy consulted to resume IV heparin. ? ?Heparin level slightly sub-therapeutic this AM.  Patient has been therapeutic and stable on current heparin rate, so unsure why it is sub-therapeutic this AM.  No issue with heparin infusion and it is infusing via PICC.   No bleeding reported.  ? ?Goal of Therapy:  ?Heparin level 0.3-0.7 units/ml ?Monitor platelets by anticoagulation protocol: Yes ?  ?Plan:  ?Increase heparin gtt to 1700 units/hr ?Daily heparin level and CBC ? ?Simranjit Thayer D. Mina Marble, PharmD, BCPS, BCCCP ?05/26/2021, 8:18 AM ? ?

## 2021-05-26 NOTE — Progress Notes (Signed)
[facility] ADULT ICU REPLACEMENT PROTOCOL ? ? ?The patient does apply for the [facility] Adult ICU Electrolyte Replacment Protocol based on the criteria listed below:  ? ?1.Exclusion criteria: TCTS patients, ECMO patients, and Dialysis patients ?2. Is GFR >/= 30 ml/min? Yes.    ?Patient's GFR today is >60 ?3. Is SCr </= 2? Yes.   ?Patient's SCr is 0.95 mg/dL ?4. Did SCr increase >/= 0.5 in 24 hours? No. ?5.Pt's weight >40kg  Yes.   ?6. Abnormal electrolyte(s): K  ?7. Electrolytes replaced per protocol ?8.  Call MD STAT for K+ </= 2.5, Phos </= 1, or Mag </= 1 ?Physician:  James Ivanoff ? ?[name] E Caileen Veracruz 05/26/2021 4:32 AM  ?

## 2021-05-26 NOTE — Progress Notes (Addendum)
? ?NAME:  [name], MRN:  [id number], DOB:  07/22/1970, LOS: 53 ?ADMISSION DATE:  05/16/2021, CONSULTATION DATE:  5/5 ?REFERRING MD:  Dr. Sabra Heck CHIEF COMPLAINT:  ARF  ? ?History of Present Illness:  ?51 year old female with pertinent PMH of anxiety, depression, chronic pain syndrome on multiple psych meds presents to [facility] on 5/5 with acute respiratory failure. ? ?Patient recently admitted 02/14/2021 with similar episode of respiratory failure with sepsis and aspiration pneumonia and patient was intubated.  Patient left AMA on 02/21/2021.  Patient at home takes multiple psych meds: Xanax, amitriptyline, Lyrica, Trintellix, Ambien, citalopram. ?  ?On 5/5, family went to see patient at home and found unresponsive on floor.  EMS called and patient was unresponsive and sats in 61s.  Patient bagged and transported to [facility] ED.  On arrival to [facility] ED patient GCS very low.  Breathing spontaneously but requiring BVM.  Intubated for hypercarbic hypoxic respiratory failure. ? ?Past Medical History:  ?Anxiety / Depression  ?Panic Attacks  ?Chronic Back Pain  ?Factor 5 Leiden Mutation, heterozygous  ?HTN  ?Scoliosis  ?Ulcer  ? ?Significant Hospital Events:  ?5/5 admitted to [facility] intubated for resp failure; transferring to [facility]  ?5/6 slight hypoxia this a.m. on vent requiring increase of PEEP from 5-8 and up titration of FiO2 from 70-100 ?5/7 notified of positive blood cultures for night, antibiotic regiment remains seen with broad coverage.  She is more alert and oriented this a.m. ?5/8 severe LV and RV dysfunction, LVEF 20%.  Persistent hypoxia and hypercarbia.  ?5/9 weaned off paralytics and milnirone  ?5/10 weaned off of epoprostenol.  Vanco stopped.  ?5/11 weaned off of levophed.  Levaquin  ?5/12 poor synchrony on MV, increasing sedation and restart paralytics. Amiodarone restarted for aflutter. PICC line placed  ?5/13 improved ventilator synchrony.  Now 5 L net negative.  Improving oxygenation. ?5/14 Hypercarbia corrected on 8  cc/kg.  Oxygenation remained steady overnight.  Patient will open her eyes and squeeze hands to command. ? ?Interim History / Subjective:  ?Tmax 99.5  ?Vent - PEEP 8, FiO2 40% ?Glucose range 107-140 ?I/O 5.2L UOP, -2.5L in last 24 hours  ?No acute events per RN ? ?Objective   ?Blood pressure 116/63, pulse (!) 125, temperature 99.5 ?F (37.5 ?C), temperature source Bladder, resp. rate (!) 24, height 5\' 3"  (1.6 m), weight 109.8 kg, SpO2 100 %. ?   ?Vent Mode: PRVC ?FiO2 (%):  [40 %] 40 % ?Set Rate:  [22 bmp-24 bmp] 24 bmp ?Vt Set:  [420 mL] 420 mL ?PEEP:  [8 cmH20] 8 cmH20 ?Plateau Pressure:  [17 cmH20-20 cmH20] 20 cmH20  ? ?Intake/Output Summary (Last 24 hours) at 05/26/2021 0742 ?Last data filed at 05/26/2021 0700 ?Gross per 24 hour  ?Intake 2681.52 ml  ?Output 5205 ml  ?Net -2523.48 ml  ? ?Filed Weights  ? 05/24/21 0459 05/25/21 0453 05/26/21 0500  ?Weight: 114.9 kg 113 kg 109.8 kg  ? ? ?Examination: ?General: critically ill appearing adult female lying in bed on vent  ?HEENT: MM pink/moist, anicteric, pupils reactive  ?Neuro: sedate but awakens to voice, nods yes/no ?CV: s1s2 regular, ST on monitor, no m/r/g ?PULM: non-labored at rest on vent, lungs bilaterally coarse with occasional crackles  ?GI: soft, bsx4 active  ?Extremities: warm/dry, overall improved but residual generalized 1+ edema  ?Skin: no rashes or lesions ? ?Resolved Hospital Problem list   ?Shock liver ?Septic shock  ? ?Assessment & Plan:  ? ?Cor pulmonale, Acute ([place]) ?Severe RV dysfunction on echocardiogram with  pressue volume overload.  Developed tachycardia with milrinone. Milrinone off, off epoprostenol.  Follow-up point-of-care echo shows persistent RV dysfunction with  septal dyssynchrony. SCV O2 remains normal. ?-diuresis with lasix infusion rate reduced 5/14 with developing contraction alkalosis  ?-replacement KCL  ?-Cardiology consulted for evaluation  ?-assess CVP, SvO2  ? ?Respiratory failure with hypoxia and hypercapnia (HCC) ?Refractory  hypoxia and hypercarbia due to predominantly right-sided airspace disease (PNA) and possible superimposed pulmonary edema. Bilateral airspace disease predominantly on the right side due to right lung aspiration.  Asymmetry limits utility of PEEP. Improved oxygenation with epoprostenol, diuresis and reduction in PEEP to prevent over distention. Off paralytics. Hypercarbia on 6cc/kg with dyssynchrony.  Completed abx.  ?-PRVC 8cc/kg  ?-wean PEEP / fiO2 for sats >90% ?-follow intermittent CXR  ?-wean sedation for RASS goal 0 to -1 ?-D10 vent, will need to discuss with family possibility of needing tracheostomy ? ?Severe Sepsis ?Required low-dose norepinephrine. S.epidermidis .  However, shock may be largely due to RV dysfunction. ?-off vasopressors, milrinone, epoprostenol  ?-monitor hemodynamics ?-assess SvO2, Co-ox  ?-Cards consulted, may need ischemic evaluation once recovered from current illness ?-repeat limited ECHO ordered  ? ?AF / Flutter  ?Currently ST  ?-tele monitoring  ?-monitor off amiodarone ?-suspect related to prior medications, acute illness given  ? ?Acute Metabolic Encephalopathy ?Required NMB. Now lightening sedation.  ?-wean sedation, RASS goal 0 to -1  ? ?HFrEF (heart failure with reduced ejection fraction) ([place]) ?EF 20%, change from echo 2 years ago.  Possibly sepsis induced myocardial dysfunction. ?Likely also component of diastolic dysfunction with restrictive filling from RV pressure volume overload. ?-lasix infusion as above  ?-Cards consulted, appreciate evaluation  ?-repeat ECHO as above  ? ?Hypokalemia  ?-additional KCL given ongoing diuresis  ? ?Best practice (evaluated daily)  ?Diet: NPO, tube feeds ?Pain/Anxiety/Delirium protocol (if indicated): fentanyl, propofol, oxy, klonopin ?VAP protocol (if indicated): yes ?DVT prophylaxis: heparin ?GI prophylaxis: PPI ?Glucose control: none ?Mobility: bedrest ?Disposition: ICU ?Code Status: DNR ? ?Critical Care Time: 35 minutes  ? ?Noe Gens,  MSN, APRN, NP-C, AGACNP-BC ?[facility] ?05/26/2021, 7:42 AM ? ? ?Please see [email or web address] for pager details.  ? ?From 7A-7P if no response, please call [phone] ?After hours, please call Warren Lacy [phone] ? ?  ?

## 2021-05-26 NOTE — Progress Notes (Signed)
?  Amiodarone Drug - Drug Interaction Consult Note ? ?Recommendations: ?Monitor for myalgia ?Levo is stopping today ? ?Amiodarone is metabolized by the cytochrome P450 system and therefore has the potential to cause many drug interactions. Amiodarone has an average plasma half-life of 50 days (range 20 to 100 days).  ? ?There is potential for drug interactions to occur several weeks or months after stopping treatment and the onset of drug interactions may be slow after initiating amiodarone.  ? ?[x]  Statins: Increased risk of myopathy. Simvastatin- restrict dose to 20mg  daily. ?Other statins: counsel patients to report any muscle pain or weakness immediately. ? ?[]  Anticoagulants: Amiodarone can increase anticoagulant effect. Consider warfarin dose reduction. Patients should be monitored closely and the dose of anticoagulant altered accordingly, remembering that amiodarone levels take several weeks to stabilize. ? ?[]  Antiepileptics: Amiodarone can increase plasma concentration of phenytoin, the dose should be reduced. Note that small changes in phenytoin dose can result in large changes in levels. Monitor patient and counsel on signs of toxicity. ? ?[]  Beta blockers: increased risk of bradycardia, AV block and myocardial depression. Sotalol - avoid concomitant use. ? ?[]   Calcium channel blockers (diltiazem and verapamil): increased risk of bradycardia, AV block and myocardial depression. ? ?[]   Cyclosporine: Amiodarone increases levels of cyclosporine. Reduced dose of cyclosporine is recommended. ? ?[]  Digoxin dose should be halved when amiodarone is started. ? ?[]  Diuretics: increased risk of cardiotoxicity if hypokalemia occurs. ? ?[]  Oral hypoglycemic agents (glyburide, glipizide, glimepiride): increased risk of hypoglycemia. Patient's glucose levels should be monitored closely when initiating amiodarone therapy.  ? ?[x]  Drugs that prolong the QT interval:  Torsades de pointes risk may be increased with  concurrent use - avoid if possible.  Monitor QTc, also keep magnesium/potassium WNL if concurrent therapy can't be avoided. ? Antibiotics: e.g. fluoroquinolones, erythromycin. ? Antiarrhythmics: e.g. quinidine, procainamide, disopyramide, sotalol. ? Antipsychotics: e.g. phenothiazines, haloperidol. ?  Lithium, tricyclic antidepressants, and methadone. ? ? ? ? ? ?

## 2021-05-27 ENCOUNTER — Inpatient Hospital Stay (HOSPITAL_COMMUNITY): Payer: Medicaid Other

## 2021-05-27 DIAGNOSIS — I5043 Acute on chronic combined systolic (congestive) and diastolic (congestive) heart failure: Secondary | ICD-10-CM | POA: Diagnosis not present

## 2021-05-27 DIAGNOSIS — J9602 Acute respiratory failure with hypercapnia: Secondary | ICD-10-CM | POA: Diagnosis not present

## 2021-05-27 DIAGNOSIS — I483 Typical atrial flutter: Secondary | ICD-10-CM | POA: Diagnosis not present

## 2021-05-27 DIAGNOSIS — J9690 Respiratory failure, unspecified, unspecified whether with hypoxia or hypercapnia: Secondary | ICD-10-CM | POA: Diagnosis not present

## 2021-05-27 DIAGNOSIS — R0902 Hypoxemia: Secondary | ICD-10-CM | POA: Diagnosis not present

## 2021-05-27 DIAGNOSIS — J9601 Acute respiratory failure with hypoxia: Secondary | ICD-10-CM | POA: Diagnosis not present

## 2021-05-27 LAB — COMPREHENSIVE METABOLIC PANEL
ALT: 17 U/L (ref 0–44)
AST: 23 U/L (ref 15–41)
Albumin: 2.8 g/dL — ABNORMAL LOW (ref 3.5–5.0)
Alkaline Phosphatase: 149 U/L — ABNORMAL HIGH (ref 38–126)
Anion gap: 10 (ref 5–15)
BUN: 26 mg/dL — ABNORMAL HIGH (ref 6–20)
CO2: 35 mmol/L — ABNORMAL HIGH (ref 22–32)
Calcium: 9.4 mg/dL (ref 8.9–10.3)
Chloride: 93 mmol/L — ABNORMAL LOW (ref 98–111)
Creatinine, Ser: 0.94 mg/dL (ref 0.44–1.00)
GFR, Estimated: >60 mL/min (ref >60)
Glucose, Bld: 173 mg/dL — ABNORMAL HIGH (ref 70–99)
Potassium: 3.7 mmol/L (ref 3.5–5.1)
Sodium: 138 mmol/L (ref 135–145)
Total Bilirubin: 0.7 mg/dL (ref 0.3–1.2)
Total Protein: 6.6 g/dL (ref 6.5–8.1)

## 2021-05-27 LAB — BASIC METABOLIC PANEL
Anion gap: 14 (ref 5–15)
BUN: 24 mg/dL — ABNORMAL HIGH (ref 6–20)
CO2: 35 mmol/L — ABNORMAL HIGH (ref 22–32)
Calcium: 10.2 mg/dL (ref 8.9–10.3)
Chloride: 89 mmol/L — ABNORMAL LOW (ref 98–111)
Creatinine, Ser: 0.95 mg/dL (ref 0.44–1.00)
GFR, Estimated: >60 mL/min (ref >60)
Glucose, Bld: 167 mg/dL — ABNORMAL HIGH (ref 70–99)
Potassium: 3.4 mmol/L — ABNORMAL LOW (ref 3.5–5.1)
Sodium: 138 mmol/L (ref 135–145)

## 2021-05-27 LAB — POCT I-STAT 7, (LYTES, BLD GAS, ICA,H+H)
Acid-Base Excess: 13 mmol/L — ABNORMAL HIGH (ref 0.0–2.0)
Bicarbonate: 40.2 mmol/L — ABNORMAL HIGH (ref 20.0–28.0)
Calcium, Ion: 1.24 mmol/L (ref 1.15–1.40)
HCT: 42 % (ref 36.0–46.0)
Hemoglobin: 14.3 g/dL (ref 12.0–15.0)
O2 Saturation: 94 %
Patient temperature: 99.7
Potassium: 3.4 mmol/L — ABNORMAL LOW (ref 3.5–5.1)
Sodium: 138 mmol/L (ref 135–145)
TCO2: 42 mmol/L — ABNORMAL HIGH (ref 22–32)
pCO2 arterial: 60.6 mmHg — ABNORMAL HIGH (ref 32–48)
pH, Arterial: 7.432 (ref 7.35–7.45)
pO2, Arterial: 73 mmHg — ABNORMAL LOW (ref 83–108)

## 2021-05-27 LAB — CBC
HCT: 42.9 % (ref 36.0–46.0)
Hemoglobin: 13.9 g/dL (ref 12.0–15.0)
MCH: 31.6 pg (ref 26.0–34.0)
MCHC: 32.4 g/dL (ref 30.0–36.0)
MCV: 97.5 fL (ref 80.0–100.0)
Platelets: 228 10*3/uL (ref 150–400)
RBC: 4.4 MIL/uL (ref 3.87–5.11)
RDW: 14.5 % (ref 11.5–15.5)
WBC: 5.9 10*3/uL (ref 4.0–10.5)
nRBC: 0 % (ref 0.0–0.2)

## 2021-05-27 LAB — COOXEMETRY PANEL
Carboxyhemoglobin: 1.4 % (ref 0.5–1.5)
Methemoglobin: <0.7 % (ref 0.0–1.5)
O2 Saturation: 59.1 %
Total hemoglobin: 14 g/dL (ref 12.0–16.0)

## 2021-05-27 LAB — GLUCOSE, CAPILLARY
Glucose-Capillary: 115 mg/dL — ABNORMAL HIGH (ref 70–99)
Glucose-Capillary: 125 mg/dL — ABNORMAL HIGH (ref 70–99)
Glucose-Capillary: 141 mg/dL — ABNORMAL HIGH (ref 70–99)
Glucose-Capillary: 149 mg/dL — ABNORMAL HIGH (ref 70–99)
Glucose-Capillary: 153 mg/dL — ABNORMAL HIGH (ref 70–99)
Glucose-Capillary: 156 mg/dL — ABNORMAL HIGH (ref 70–99)

## 2021-05-27 LAB — HEPARIN LEVEL (UNFRACTIONATED): Heparin Unfractionated: 0.36 IU/mL (ref 0.30–0.70)

## 2021-05-27 MED ORDER — METOLAZONE 5 MG PO TABS
2.5000 mg | ORAL_TABLET | Freq: Once | ORAL | Status: AC
  Administered 2021-05-27: 2.5 mg
  Filled 2021-05-27: qty 1

## 2021-05-27 MED ORDER — ACETAMINOPHEN 325 MG PO TABS
650.0000 mg | ORAL_TABLET | Freq: Four times a day (QID) | ORAL | Status: DC | PRN
  Administered 2021-05-27: 650 mg
  Filled 2021-05-27: qty 2

## 2021-05-27 MED ORDER — OXYCODONE HCL 5 MG PO TABS
5.0000 mg | ORAL_TABLET | Freq: Four times a day (QID) | ORAL | Status: DC | PRN
  Administered 2021-05-27 (×2): 5 mg
  Filled 2021-05-27 (×2): qty 1

## 2021-05-27 MED ORDER — POTASSIUM CHLORIDE 10 MEQ/50ML IV SOLN
10.0000 meq | INTRAVENOUS | Status: AC
  Administered 2021-05-27 (×3): 10 meq via INTRAVENOUS
  Filled 2021-05-27 (×3): qty 50

## 2021-05-27 MED ORDER — POTASSIUM CHLORIDE 10 MEQ/50ML IV SOLN
10.0000 meq | INTRAVENOUS | Status: AC
  Administered 2021-05-27 – 2021-05-28 (×4): 10 meq via INTRAVENOUS
  Filled 2021-05-27 (×4): qty 50

## 2021-05-27 MED ORDER — DIGOXIN 125 MCG PO TABS
0.1250 mg | ORAL_TABLET | Freq: Every day | ORAL | Status: DC
  Administered 2021-05-27: 0.125 mg
  Filled 2021-05-27: qty 1

## 2021-05-27 MED ORDER — ORAL CARE MOUTH RINSE
15.0000 mL | Freq: Two times a day (BID) | OROMUCOSAL | Status: DC
  Administered 2021-05-28 – 2021-06-03 (×9): 15 mL via OROMUCOSAL

## 2021-05-27 NOTE — Procedures (Signed)
Extubation Procedure Note ? ?Patient Details:   ?Name: [name] ?DOB: [birth year] ?MRN: [id number] ?  ?Airway Documentation:  ?  ?Vent end date: 05/27/21 Vent end time: 1100  ? ?Evaluation ? O2 sats: stable throughout ?Complications: No apparent complications ?Patient did tolerate procedure well. ?Bilateral Breath Sounds: Clear, Diminished ?  ?Yes ? ?Patient extubated per order to bipap with no apparent complications. Positive cuff leak was noted prior to extubation. Patient has strong cough and is able to speak. Vitals are stable. RT will continue to monitor.  ? ?Theresa Dohrman Lajuana Ripple ?05/27/2021, 11:11 AM ? ?

## 2021-05-27 NOTE — Progress Notes (Addendum)
Notified of BMP ?Making large amounts of urine on lasix drip ?Has 40 meq oral potassium ordered three times daily- and a dose is due tonight ?Will give 40 meq IV Kcl in addition since she has put put very large amounts of urine ?Asked RN to send AM BMP and mag around 230 am  ? ?Addendum at 4:16 am ?K is 4.8 this AM and mag is 2.1 ?Will order another check for 9 am - further orders would depend on that level  ? ? ?

## 2021-05-27 NOTE — Progress Notes (Signed)
Patient's heart rate is sustained 130's. [name] Danielle Portela PA informed . No orders will continue to monitor . ?

## 2021-05-27 NOTE — Progress Notes (Signed)
ANTICOAGULATION CONSULT NOTE ? ?Pharmacy Consult:  Heparin ?Indication: atrial fibrillation ? ?Allergies  ?Allergen Reactions  ? Penicillins Anaphylaxis  ?  Has patient had a PCN reaction causing immediate rash, facial/tongue/throat swelling, SOB or lightheadedness with hypotension: No ?Has patient had a PCN reaction causing severe rash involving mucus membranes or skin necrosis: No ?Has patient had a PCN reaction that required hospitalization No ?Has patient had a PCN reaction occurring within the last 10 years: No ?If all of the above answers are "NO", then may proceed with Cephalosporin use. ?  ? Ibuprofen Nausea And Vomiting  ?  GI upset, Shakes  ? Peppermint Flavor [Flavoring Agent]   ?  Allergic to pepper the spice  ? Aspirin Nausea And Vomiting  ?  Gi upset  ? ? ?Patient Measurements: ?Height: 5\' 3"  (160 cm) ?Weight: 109.5 kg (241 lb 6.5 oz) ?IBW/kg (Calculated) : 52.4 ?Heparin Dosing Weight: 74 kg ? ?Vital Signs: ?Temp: 99.2 ?F (37.3 ?C) (05/16 0354) ?Temp Source: Bladder (05/16 0354) ?BP: 95/33 (05/16 0600) ?Pulse Rate: 116 (05/16 0400) ? ?Labs: ?Recent Labs  ?  05/25/21 ?[id number] 05/25/21 ?1052 05/26/21 ?[id number] 05/26/21 ?[id number] 05/27/21 ?[id number] 05/27/21 ?[id number]  ?HGB 13.3   < > 14.3 14.6 13.9 14.3  ?HCT 42.2   < > 43.6 43.0 42.9 42.0  ?PLT 194  --  198  --  228  --   ?HEPARINUNFRC 0.50  --  0.29*  --  0.36  --   ?CREATININE 1.07*  --  0.95  --  0.94  --   ? < > = values in this interval not displayed.  ? ? ? ?Estimated Creatinine Clearance: 85 mL/min (by C-G formula based on SCr of 0.94 mg/dL). ? ? ?Assessment: ?3 YOF initially started on IV heparin for rule out PE, then switched to heparin SQ for VTE prophylaxis.  Now with persistent Afib (CHADSVASC 3), so Pharmacy consulted to resume IV heparin. ? ?Heparin level therapeutic; no bleeding reported.  ? ?Goal of Therapy:  ?Heparin level 0.3-0.7 units/ml ?Monitor platelets by anticoagulation protocol: Yes ?  ?Plan:  ?Continue heparin gtt at 1700 units/hr ?Daily heparin  level and CBC  ? ?Ronzell Laban D. Mina Marble, PharmD, BCPS, BCCCP ?05/27/2021, 7:00 AM ? ?

## 2021-05-27 NOTE — Progress Notes (Addendum)
? ? Advanced Heart Failure Rounding Note ? ?PCP-Cardiologist: None  ? ?Subjective:   ? ?5/15 Started back in amio drip. Lasix drip increased to 8 mg per hour. ? ?Remains in A Flutter.  ? ?Remains intubated.  ? ? ?Objective:   ?Weight Range: ?109.5 kg ?Body mass index is 42.76 kg/m?.  ? ?Vital Signs:   ?Temp:  [98 ?F (36.7 ?C)-99.2 ?F (37.3 ?C)] 99.2 ?F (37.3 ?C) (05/16 0354) ?Pulse Rate:  [49-129] 116 (05/16 0400) ?Resp:  [14-27] 24 (05/16 0600) ?BP: (89-139)/(33-87) 95/33 (05/16 0600) ?SpO2:  [93 %-100 %] 94 % (05/16 0400) ?Arterial Line BP: (99-126)/(48-71) 105/58 (05/16 0600) ?FiO2 (%):  [40 %] 40 % (05/16 0306) ?Weight:  [109.5 kg] 109.5 kg (05/16 0334) ?Last BM Date : 05/26/21 ? ?Weight change: ?Filed Weights  ? 05/25/21 0453 05/26/21 0500 05/27/21 0334  ?Weight: 113 kg 109.8 kg 109.5 kg  ? ? ?Intake/Output:  ? ?Intake/Output Summary (Last 24 hours) at 05/27/2021 [id number] ?Last data filed at 05/27/2021 0600 ?Gross per 24 hour  ?Intake 3350.54 ml  ?Output 5450 ml  ?Net -2099.46 ml  ?  ? ? ?Physical Exam  ? CVP 17  ?General:  Intubated  ?HEENT: ETT ?Neck: Supple. JVP to jaw  Carotids 2+ bilat; no bruits. No lymphadenopathy or thyromegaly appreciated. ?Cor: PMI nondisplaced. Tachy regular rate & rhythm. No rubs, gallops or murmurs. ?Lungs: Clear ?Abdomen: Soft, nontender, nondistended. No hepatosplenomegaly. No bruits or masses. Good bowel sounds. ?Extremities: No cyanosis, clubbing, rash, edema. RUE PICC  ?Neuro: Intubated. Opens eyes.  ?Telemetry  ? ?A flutter 120s personally checked  ? ?EKG  ? N/A ? ?Labs  ?  ?CBC ?Recent Labs  ?  05/26/21 ?[id number] 05/26/21 ?[id number] 05/27/21 ?[id number] 05/27/21 ?[id number]  ?WBC 5.8  --  5.9  --   ?HGB 14.3   < > 13.9 14.3  ?HCT 43.6   < > 42.9 42.0  ?MCV 97.3  --  97.5  --   ?PLT 198  --  228  --   ? < > = values in this interval not displayed.  ? ?Basic Metabolic Panel ?Recent Labs  ?  05/25/21 ?[id number] 05/25/21 ?1052 05/26/21 ?[id number] 05/26/21 ?[id number] 05/27/21 ?[id number] 05/27/21 ?[id number]  ?NA 139   < > 139   <  > 138 138  ?K 3.4*   < > 3.4*   < > 3.7 3.4*  ?CL 92*  --  95*  --  93*  --   ?CO2 37*  --  35*  --  35*  --   ?GLUCOSE 147*  --  140*  --  173*  --   ?BUN 21*  --  22*  --  26*  --   ?CREATININE 1.07*  --  0.95  --  0.94  --   ?CALCIUM 9.0  --  9.4  --  9.4  --   ?MG 2.1  --  2.2  --   --   --   ? < > = values in this interval not displayed.  ? ?Liver Function Tests ?Recent Labs  ?  05/27/21 ?0306  ?AST 23  ?ALT 17  ?ALKPHOS 149*  ?BILITOT 0.7  ?PROT 6.6  ?ALBUMIN 2.8*  ? ?No results for input(s): LIPASE, AMYLASE in the last 72 hours. ?Cardiac Enzymes ?No results for input(s): CKTOTAL, CKMB, CKMBINDEX, TROPONINI in the last 72 hours. ? ?BNP: ?BNP (last 3 results) ?Recent Labs  ?  02/14/21 ?0742 02/14/21 ?2153 05/17/21 ?0415  ?BNP 309.0* 162.0* 782.6*  ? ? ?  ProBNP (last 3 results) ?No results for input(s): PROBNP in the last 8760 hours. ? ? ?D-Dimer ?No results for input(s): DDIMER in the last 72 hours. ?Hemoglobin A1C ?No results for input(s): HGBA1C in the last 72 hours. ?Fasting Lipid Panel ?Recent Labs  ?  05/26/21 ?0315  ?TRIG 303*  ? ?Thyroid Function Tests ?No results for input(s): TSH, T4TOTAL, T3FREE, THYROIDAB in the last 72 hours. ? ?Invalid input(s): FREET3 ? ?Other results: ? ? ?Imaging  ? ? ?ECHOCARDIOGRAM LIMITED ? ?Result Date: 05/26/2021 ?   ECHOCARDIOGRAM LIMITED REPORT   Patient Name:   [name] Date of Exam: 05/26/2021 Medical Rec #:  [id number]        Height:       63.0 in Accession #:    [id number]       Weight:       242.1 lb Date of Birth:  Jul 17, 1970         BSA:          2.097 m? Patient Age:    51 years         BP:           115/73 mmHg Patient Gender: F                HR:           124 bpm. Exam Location:  Inpatient Procedure: 2D Echo, Limited Echo, Cardiac Doppler, Color Doppler and            Intracardiac Opacification Agent Indications:    Respiratory distress  History:        Patient has prior history of Echocardiogram examinations. Risk                 Factors:Hypertension.   Sonographer:    Jyl Heinz Referring Phys: [id number] [place]  1. Left ventricular ejection fraction, by estimation, is 20 to 25%. The left ventricle has severely decreased function. The left ventricle demonstrates global hypokinesis. The left ventricular internal cavity size was mildly dilated. Left ventricular diastolic parameters are indeterminate.  2. Right ventricular systolic function is mildly reduced. The right ventricular size is mildly enlarged. There is normal pulmonary artery systolic pressure.  3. Left atrial size was severely dilated.  4. Right atrial size was severely dilated.  5. A small pericardial effusion is present. The pericardial effusion is posterior to the left ventricle.  6. The mitral valve is normal in structure. No evidence of mitral valve regurgitation. No evidence of mitral stenosis.  7. Tricuspid valve regurgitation is mild to moderate.  8. The aortic valve is tricuspid. Aortic valve regurgitation is not visualized. No aortic stenosis is present.  9. The inferior vena cava is dilated in size with <50% respiratory variability, suggesting right atrial pressure of 15 mmHg. FINDINGS  Left Ventricle: Left ventricular ejection fraction, by estimation, is 20 to 25%. The left ventricle has severely decreased function. The left ventricle demonstrates global hypokinesis. Definity contrast agent was given IV to delineate the left ventricular endocardial borders. The left ventricular internal cavity size was mildly dilated. There is no left ventricular hypertrophy of the inferior segment. Left ventricular diastolic parameters are indeterminate. Right Ventricle: The right ventricular size is mildly enlarged. No increase in right ventricular wall thickness. Right ventricular systolic function is mildly reduced. There is normal pulmonary artery systolic pressure. The tricuspid regurgitant velocity  is 2.00 m/s, and with an assumed right atrial pressure of 15 mmHg, the estimated right  ventricular systolic  pressure is 31.0 mmHg. Left Atrium: Left atrial size was severely dilated. Right Atrium: Right atrial size was severely dilated. Pericardium: A small pericardial effusion is present. The pericardial effusion is posterior to the left ventricle. Mitral Valve: The mitral valve is normal in structure. No evidence of mitral valve stenosis. Tricuspid Valve: The tricuspid valve is normal in structure. Tricuspid valve regurgitation is mild to moderate. No evidence of tricuspid stenosis. Aortic Valve: The aortic valve is tricuspid. Aortic valve regurgitation is not visualized. No aortic stenosis is present. Aortic valve peak gradient measures 4.0 mmHg. Pulmonic Valve: The pulmonic valve was normal in structure. Pulmonic valve regurgitation is trivial. No evidence of pulmonic stenosis. Aorta: The aortic root is normal in size and structure. Venous: The inferior vena cava is dilated in size with less than 50% respiratory variability, suggesting right atrial pressure of 15 mmHg. IAS/Shunts: No atrial level shunt detected by color flow Doppler. LEFT VENTRICLE PLAX 2D LVIDd:         5.80 cm      Diastology LVIDs:         5.00 cm      LV e' medial:    7.62 cm/s LV PW:         1.30 cm      LV E/e' medial:  11.0 LV IVS:        0.80 cm      LV e' lateral:   6.20 cm/s LVOT diam:     2.00 cm      LV E/e' lateral: 13.5 LV SV:         29 LV SV Index:   14 LVOT Area:     3.14 cm?  LV Volumes (MOD) LV vol d, MOD A2C: 126.0 ml LV vol d, MOD A4C: 141.0 ml LV vol s, MOD A2C: 99.9 ml LV vol s, MOD A4C: 109.0 ml LV SV MOD A2C:     26.1 ml LV SV MOD A4C:     141.0 ml LV SV MOD BP:      29.6 ml RIGHT VENTRICLE            IVC RV S prime:     9.79 cm/s  IVC diam: 2.20 cm TAPSE (M-mode): 1.4 cm LEFT ATRIUM         Index LA diam:    3.80 cm 1.81 cm/m?  AORTIC VALVE AV Area (Vmax): 2.48 cm? AV Vmax:        100.00 cm/s AV Peak Grad:   4.0 mmHg LVOT Vmax:      79.00 cm/s LVOT Vmean:     62.200 cm/s LVOT VTI:       0.093 m  AORTA Ao  Root diam: 3.10 cm Ao Asc diam:  3.10 cm MITRAL VALVE               TRICUSPID VALVE MV Area (PHT): 9.14 cm?    TR Peak grad:   16.0 mmHg MV Decel Time: 83 msec     TR Vmax:        200.00 cm/s MV E veloci

## 2021-05-27 NOTE — Progress Notes (Signed)
65 mls fentanyl infusion wasted and witnessed by Gordy Savers ?

## 2021-05-27 NOTE — Progress Notes (Addendum)
Nutrition Follow-up ? ?DOCUMENTATION CODES:  ? ?Not applicable ? ?INTERVENTION:  ? ?ADDENDUM (1452): Pt with poor PO intake at lunch. Pt refusing to eat more than 2 bites. Pt refusing oral nutrition supplements. Pt with increased nutrient needs related to prolonged acute illness. Discussed with CCM. Cortrak tube replaced and consult received for enteral nutrition initiation and management. ? ?Initiate tube feeds via Cortrak: ?- Osmolite 1.5 @ 50 ml/hr (1200 ml/day) ?- ProSource TF 45 ml BID ? ?Tube feeding regimen provides 1880 kcal, 97 grams of protein, and 914 ml of H2O. ? ?- RD will monitor for improvements in PO intake and transition pt to nocturnal tube feeding regimen when appropriate ? ?- Ensure Enlive po TID, each supplement provides 350 kcal and 20 grams of protein ? ?NUTRITION DIAGNOSIS:  ? ?Inadequate oral intake related to inability to eat as evidenced by NPO status. ? ?Progressing, pt now on Heart Healthy diet ? ?GOAL:  ? ?Patient will meet greater than or equal to 90% of their needs ? ?Progressing ? ?MONITOR:  ? ?PO intake, Supplement acceptance, Labs, Weight trends, I & O's ? ?REASON FOR ASSESSMENT:  ? ?Ventilator, Consult ?Enteral/tube feeding initiation and management (trickle tube feeds) ? ?ASSESSMENT:  ? ?51 year old female who presented to the ED on 5/05 after being found unresponsive. Pt required intubation in the ED. PMH of anxiety, depression, chronic pain syndrome, HTN, GERD, polysubstance abuse. Pt admitted with hypercapnic and hypoxemic respiratory failure, shock in the setting of gram-positive bacteremia with possible mixed cardiogenic shock, acute encephalopathy, ARDS, severe systolic and diastolic CHF. ? ?05/07 - pt started on paralytics ?05/08 - Cortrak placed (tip post-pyloric) ?05/16 - extubated to BiPAP, pt pulled Cortrak ?05/17 - diet advanced to Heart Healthy, Cortrak replaced (tip gastric) ? ?Discussed pt with RN and during ICU rounds. Pt pulled Cortrak out yesterday. Diet  advanced to Heart Healthy this morning. Per RN, pt has acted like she does not want anything to eat or drink. ? ?Spoke with pt at bedside. Pt sitting up in chair at time of RD visit. Pt somewhat confused but able to answer most questions appropriately. Pt reports poor appetite at this time. She is willing to try chocolate Ensure supplements. RD discussed importance of adequate PO intake in healing and maintaining lean muscle mass. Pt expresses understanding. ? ?Unsure if pt will be able to meet her nutritional needs via PO intake alone. Will monitor PO intake and supplement acceptance as well as potential need to replace Cortrak NG tube for supplemental enteral nutrition. ? ?ADDENDUM (1452): Pt eating poorly and states that she cannot eat more. Discussed with CCM. Cortrak replaced by Cortrak team. Consult received for enteral nutrition initiation and management. Orders placed. ? ?Admit weight: 94 kg ?Current weight: 109.5 kg ? ?Pt with mild pitting generalized edema. ? ?Medications reviewed and include: SSI q 4 hours, protonix, klor-con 40 mEq TID, senna, spironolactone, amiodarone drip, lasix drip, heparin drip ? ?Labs reviewed: potassium 3.4, BUN 25 ?CBG's: 125-170 x 24 hours ? ?UOP: 9425 ml x 24 hours ?I/O's: -21.9 L since admit ? ?Diet Order:   ?Diet Order   ? ?       ?  Diet Heart Room service appropriate? Yes; Fluid consistency: Thin  Diet effective now       ?  ? ?  ?  ? ?  ? ? ?EDUCATION NEEDS:  ? ?Education needs have been addressed ? ?Skin:  Skin Assessment: Reviewed RN Assessment ? ?Last BM:  05/27/21 multiple type 7 ? ?  Height:  ? ?Ht Readings from Last 1 Encounters:  ?05/17/21 5\' 3"  (1.6 m)  ? ? ?Weight:  ? ?Wt Readings from Last 1 Encounters:  ?05/27/21 109.5 kg  ? ? ?Ideal Body Weight:  52.3 kg ? ?BMI:  Body mass index is 42.76 kg/m?. ? ?Estimated Nutritional Needs:  ? ?Kcal:  2000-2200 ? ?Protein:  100-115 grams ? ?Fluid:  >1.8 L ? ? ? ?05/29/21, MS, RD, LDN ?Inpatient Clinical Dietitian ?Please see  AMiON for contact information. ? ?

## 2021-05-27 NOTE — Progress Notes (Addendum)
? ?NAME:  AMAIRANI SHUEY, MRN:  [id number], DOB:  February 15, 1970, LOS: 11 ?ADMISSION DATE:  05/16/2021, CONSULTATION DATE:  5/5 ?REFERRING MD:  Dr. Hyacinth Meeker CHIEF COMPLAINT:  ARF  ? ?History of Present Illness:  ?51 year old female with pertinent PMH of anxiety, depression, chronic pain syndrome on multiple psych meds presents to [facility] on 5/5 with acute respiratory failure. ? ?Patient recently admitted 02/14/2021 with similar episode of respiratory failure with sepsis and aspiration pneumonia and patient was intubated.  Patient left AMA on 02/21/2021.  Patient at home takes multiple psych meds: Xanax, amitriptyline, Lyrica, Trintellix, Ambien, citalopram. ?  ?On 5/5, family went to see patient at home and found unresponsive on floor.  EMS called and patient was unresponsive and sats in 70s.  Patient bagged and transported to [facility] ED.  On arrival to [facility] ED patient GCS very low.  Breathing spontaneously but requiring BVM.  Intubated for hypercarbic hypoxic respiratory failure. ? ?Past Medical History:  ?Anxiety / Depression  ?Panic Attacks  ?Chronic Back Pain  ?Factor 5 Leiden Mutation, heterozygous  ?HTN  ?Scoliosis  ?Ulcer  ? ?Significant Hospital Events:  ?5/5 admitted to [facility] intubated for resp failure; transferring to [facility]  ?5/6 slight hypoxia this a.m. on vent requiring increase of PEEP from 5-8 and up titration of FiO2 from 70-100 ?5/7 notified of positive blood cultures for night, antibiotic regiment remains seen with broad coverage.  She is more alert and oriented this a.m. ?5/8 severe LV and RV dysfunction, LVEF 20%.  Persistent hypoxia and hypercarbia.  ?5/9 weaned off paralytics and milnirone  ?5/10 weaned off of epoprostenol.  Vanco stopped.  ?5/11 weaned off of levophed.  Levaquin  ?5/12 poor synchrony on MV, increasing sedation and restart paralytics. Amiodarone restarted for aflutter. PICC line placed  ?5/13 improved ventilator synchrony.  Now 5 L net negative.  Improving oxygenation. ?5/14 Hypercarbia corrected on 8  cc/kg.  Oxygenation remained steady overnight.  Patient will open her eyes and squeeze hands to command. ?5/15: echo with LVEF 20-25% and RV mildly reduced ? ?Interim History / Subjective:  ?Tmax 99.1 ?Currently on SBT ?Sedation stopped this am; follows commands; MAE; PERRL ?On IV lasix, heparin, and amio ? ?Objective   ?Blood pressure (!) 95/33, pulse (!) 116, temperature 99.2 ?F (37.3 ?C), temperature source Bladder, resp. rate (!) 24, height 5\' 3"  (1.6 m), weight 109.5 kg, SpO2 94 %. ?CVP:  [10 mmHg-15 mmHg] 14 mmHg  ?Vent Mode: PRVC ?FiO2 (%):  [40 %] 40 % ?Set Rate:  [24 bmp] 24 bmp ?Vt Set:  [420 mL] 420 mL ?PEEP:  [8 cmH20] 8 cmH20 ?Plateau Pressure:  [16 cmH20-21 cmH20] 18 cmH20  ? ?Intake/Output Summary (Last 24 hours) at 05/27/2021 05/29/2021 ?Last data filed at 05/27/2021 0600 ?Gross per 24 hour  ?Intake 3350.54 ml  ?Output 5450 ml  ?Net -2099.46 ml  ? ? ?Filed Weights  ? 05/25/21 0453 05/26/21 0500 05/27/21 0334  ?Weight: 113 kg 109.8 kg 109.5 kg  ? ? ?Examination: ?General:  critically ill appearing on mech vent ?HEENT: MM pink/moist; ETT in place ?Neuro: sedation just stopped; follows commands; MAE; PERRL ?CV: s1s2, afib 100-110s, no m/r/g ?PULM:  dim rhonchi BS bilaterally; on mech vent PRVC ?GI: soft, bsx4 active  ?Extremities: warm/dry, BLE 1+ edema  ?Skin: no rashes or lesions appreciated ? ?Resolved Hospital Problem list   ?Shock liver ?Septic shock  ? ?Assessment & Plan:  ? ?Cor pulmonale, Acute (HCC) ?HFrEF (heart failure with reduced ejection fraction) ?EF 20%, change from  echo 2 years ago.  Possibly sepsis induced myocardial dysfunction. ?Likely also component of diastolic dysfunction with restrictive filling from RV pressure volume overload. ?Severe RV dysfunction on echocardiogram with pressue volume overload.  Developed tachycardia with milrinone. Milrinone off, off epoprostenol.  Follow-up point-of-care echo shows persistent RV dysfunction with  septal dyssynchrony. SCV O2 remains normal ?-Echo  5/15: LVEF 20-25%; RV mildly reduced and enlarged; normal pulm artery pressures; L atria and R atria severely dilated ?P: ?-HF team following; appreciate recs ?-HF plan to increase lasix and add metolazone with CVP 20 this am ?-check coox ?-replete K today ?-trend k/mag for goal k >4 and mag >2 ? ?Respiratory failure with hypoxia and hypercapnia (HCC) ?Refractory hypoxia and hypercarbia due to predominantly right-sided airspace disease (PNA) and possible superimposed pulmonary edema. Bilateral airspace disease predominantly on the right side due to right lung aspiration.  Asymmetry limits utility of PEEP. Improved oxygenation with epoprostenol, diuresis and reduction in PEEP to prevent over distention. Off paralytics. Hypercarbia on 6cc/kg with dyssynchrony.  Completed abx.  ?P: ?-SBT this am ?-consider giving a trial of extubation to bipap ?-if unable to extubate will need to have conversation with family about possible tracheostomy ?-VAP bundle in place ?-PAD protocol in place ?-wean sedation for RASS goal 0 to -1 ?-Follow intermittent CXR and ABG PRN ? ?Severe Sepsis ?Required low-dose norepinephrine. S.epidermidis .  However, shock may be largely due to RV dysfunction. ?-completed levaquin 5/11 ?P: ?-improved; remains off pressors, milrinone, flolan ?-trend cvp/coox ?-HF following; appreciate recs ? ?AF / Flutter  ?P: ?-telemetry monitoring ?-amio stopped yesterday and was restarted with afib rhythm ?-cont amio and heparin gtt ? ?Acute Metabolic Encephalopathy ?Required NMB. Now lightening sedation.  ?P: ?-wean sedation for RASS goal 0 to -1  ?-limit sedating meds ? ?Hypokalemia  ?P: ?-replete K this am ?-trend K/mag and replete as needed with ongoing diuresis ? ?Best practice (evaluated daily)  ?Diet: NPO, tube feeds ?Pain/Anxiety/Delirium protocol (if indicated): fentanyl, propofol, oxy, klonopin ?VAP protocol (if indicated): yes ?DVT prophylaxis: heparin ?GI prophylaxis: PPI ?Glucose control: none ?Mobility:  bedrest ?Disposition: ICU ?Code Status: DNR ? ?GOC: 5/16 spoke with Joice Lofts (daughter) over phone and she states that she would be okay with tracheostomy if we are unable to wean from ventilator. She would like to continue DNR but okay with intubation ? ?Critical Care Time: 35 minutes  ? ?Patsy Lager, PA-C ?[facility] ?05/27/2021, 8:05 AM ? ?Please see [email or web address] for pager details. ? ?From 7A-7P if no response, please call [phone]. ?After hours, please call ELink [phone]. ? ?

## 2021-05-28 ENCOUNTER — Inpatient Hospital Stay (HOSPITAL_COMMUNITY): Payer: Medicaid Other

## 2021-05-28 DIAGNOSIS — I483 Typical atrial flutter: Secondary | ICD-10-CM | POA: Diagnosis not present

## 2021-05-28 DIAGNOSIS — J9601 Acute respiratory failure with hypoxia: Secondary | ICD-10-CM

## 2021-05-28 DIAGNOSIS — Z4682 Encounter for fitting and adjustment of non-vascular catheter: Secondary | ICD-10-CM | POA: Diagnosis not present

## 2021-05-28 DIAGNOSIS — I5043 Acute on chronic combined systolic (congestive) and diastolic (congestive) heart failure: Secondary | ICD-10-CM | POA: Diagnosis not present

## 2021-05-28 DIAGNOSIS — Z981 Arthrodesis status: Secondary | ICD-10-CM | POA: Diagnosis not present

## 2021-05-28 DIAGNOSIS — J9602 Acute respiratory failure with hypercapnia: Secondary | ICD-10-CM | POA: Diagnosis not present

## 2021-05-28 LAB — POCT I-STAT 7, (LYTES, BLD GAS, ICA,H+H)
Acid-Base Excess: 18 mmol/L — ABNORMAL HIGH (ref 0.0–2.0)
Acid-Base Excess: 19 mmol/L — ABNORMAL HIGH (ref 0.0–2.0)
Bicarbonate: 44 mmol/L — ABNORMAL HIGH (ref 20.0–28.0)
Bicarbonate: 45 mmol/L — ABNORMAL HIGH (ref 20.0–28.0)
Calcium, Ion: 1.22 mmol/L (ref 1.15–1.40)
Calcium, Ion: 1.23 mmol/L (ref 1.15–1.40)
HCT: 42 % (ref 36.0–46.0)
HCT: 43 % (ref 36.0–46.0)
Hemoglobin: 14.3 g/dL (ref 12.0–15.0)
Hemoglobin: 14.6 g/dL (ref 12.0–15.0)
O2 Saturation: 91 %
O2 Saturation: 93 %
Patient temperature: 99.3
Patient temperature: 99.1
Potassium: 3.4 mmol/L — ABNORMAL LOW (ref 3.5–5.1)
Potassium: 3.4 mmol/L — ABNORMAL LOW (ref 3.5–5.1)
Sodium: 137 mmol/L (ref 135–145)
Sodium: 138 mmol/L (ref 135–145)
TCO2: 46 mmol/L — ABNORMAL HIGH (ref 22–32)
TCO2: 47 mmol/L — ABNORMAL HIGH (ref 22–32)
pCO2 arterial: 53.7 mmHg — ABNORMAL HIGH (ref 32–48)
pCO2 arterial: 55.2 mmHg — ABNORMAL HIGH (ref 32–48)
pH, Arterial: 7.523 — ABNORMAL HIGH (ref 7.35–7.45)
pH, Arterial: 7.521 — ABNORMAL HIGH (ref 7.35–7.45)
pO2, Arterial: 57 mmHg — ABNORMAL LOW (ref 83–108)
pO2, Arterial: 62 mmHg — ABNORMAL LOW (ref 83–108)

## 2021-05-28 LAB — BASIC METABOLIC PANEL
Anion gap: 14 (ref 5–15)
Anion gap: 14 (ref 5–15)
BUN: 25 mg/dL — ABNORMAL HIGH (ref 6–20)
BUN: 26 mg/dL — ABNORMAL HIGH (ref 6–20)
CO2: 35 mmol/L — ABNORMAL HIGH (ref 22–32)
CO2: 39 mmol/L — ABNORMAL HIGH (ref 22–32)
Calcium: 9.8 mg/dL (ref 8.9–10.3)
Calcium: 10.3 mg/dL (ref 8.9–10.3)
Chloride: 89 mmol/L — ABNORMAL LOW (ref 98–111)
Chloride: 86 mmol/L — ABNORMAL LOW (ref 98–111)
Creatinine, Ser: 0.99 mg/dL (ref 0.44–1.00)
Creatinine, Ser: 1.19 mg/dL — ABNORMAL HIGH (ref 0.44–1.00)
GFR, Estimated: >60 mL/min (ref >60)
GFR, Estimated: 56 mL/min — ABNORMAL LOW (ref >60)
Glucose, Bld: 157 mg/dL — ABNORMAL HIGH (ref 70–99)
Glucose, Bld: 153 mg/dL — ABNORMAL HIGH (ref 70–99)
Potassium: 4.8 mmol/L (ref 3.5–5.1)
Potassium: 3.2 mmol/L — ABNORMAL LOW (ref 3.5–5.1)
Sodium: 138 mmol/L (ref 135–145)
Sodium: 139 mmol/L (ref 135–145)

## 2021-05-28 LAB — COOXEMETRY PANEL
Carboxyhemoglobin: 2.1 % — ABNORMAL HIGH (ref 0.5–1.5)
Methemoglobin: <0.7 % (ref 0.0–1.5)
O2 Saturation: 74.1 %
Total hemoglobin: 14.6 g/dL (ref 12.0–16.0)

## 2021-05-28 LAB — GLUCOSE, CAPILLARY
Glucose-Capillary: 131 mg/dL — ABNORMAL HIGH (ref 70–99)
Glucose-Capillary: 170 mg/dL — ABNORMAL HIGH (ref 70–99)
Glucose-Capillary: 158 mg/dL — ABNORMAL HIGH (ref 70–99)
Glucose-Capillary: 131 mg/dL — ABNORMAL HIGH (ref 70–99)
Glucose-Capillary: 159 mg/dL — ABNORMAL HIGH (ref 70–99)
Glucose-Capillary: 208 mg/dL — ABNORMAL HIGH (ref 70–99)

## 2021-05-28 LAB — CBC
HCT: 43.1 % (ref 36.0–46.0)
Hemoglobin: 14.3 g/dL (ref 12.0–15.0)
MCH: 31.6 pg (ref 26.0–34.0)
MCHC: 33.2 g/dL (ref 30.0–36.0)
MCV: 95.1 fL (ref 80.0–100.0)
Platelets: 274 10*3/uL (ref 150–400)
RBC: 4.53 MIL/uL (ref 3.87–5.11)
RDW: 14.1 % (ref 11.5–15.5)
WBC: 9.4 10*3/uL (ref 4.0–10.5)
nRBC: 0 % (ref 0.0–0.2)

## 2021-05-28 LAB — MAGNESIUM: Magnesium: 2.1 mg/dL (ref 1.7–2.4)

## 2021-05-28 LAB — HEPARIN LEVEL (UNFRACTIONATED): Heparin Unfractionated: 0.35 IU/mL (ref 0.30–0.70)

## 2021-05-28 MED ORDER — CLONAZEPAM 0.5 MG PO TABS: 0.5000 mg | ORAL_TABLET | Freq: Two times a day (BID) | ORAL | Status: DC

## 2021-05-28 MED ORDER — OSMOLITE 1.5 CAL PO LIQD
1000.0000 mL | ORAL | Status: DC
  Administered 2021-05-28: 1000 mL
  Filled 2021-05-28 (×9): qty 1000

## 2021-05-28 MED ORDER — SODIUM CHLORIDE 0.9% FLUSH: 3.0000 mL | INTRAVENOUS | Status: DC | PRN

## 2021-05-28 MED ORDER — POTASSIUM CHLORIDE CRYS ER 20 MEQ PO TBCR
40.0000 meq | EXTENDED_RELEASE_TABLET | Freq: Two times a day (BID) | ORAL | Status: DC
  Administered 2021-05-28 – 2021-05-31 (×7): 40 meq via ORAL
  Filled 2021-05-28 (×9): qty 2

## 2021-05-28 MED ORDER — SENNOSIDES-DOCUSATE SODIUM 8.6-50 MG PO TABS
1.0000 | ORAL_TABLET | Freq: Two times a day (BID) | ORAL | Status: DC
  Administered 2021-05-28 – 2021-06-02 (×5): 1 via ORAL
  Filled 2021-05-28 (×5): qty 1

## 2021-05-28 MED ORDER — SPIRONOLACTONE 12.5 MG HALF TABLET
12.5000 mg | ORAL_TABLET | Freq: Every day | ORAL | Status: DC
Start: 2021-05-28 — End: 2021-05-29
  Administered 2021-05-28: 12.5 mg via ORAL
  Filled 2021-05-28: qty 1

## 2021-05-28 MED ORDER — SODIUM CHLORIDE 0.45 % IV SOLN: INTRAVENOUS | Status: DC

## 2021-05-28 MED ORDER — PROSOURCE TF PO LIQD
45.0000 mL | Freq: Two times a day (BID) | ORAL | Status: DC
  Administered 2021-05-28 – 2021-06-03 (×6): 45 mL
  Filled 2021-05-28 (×9): qty 45

## 2021-05-28 MED ORDER — SODIUM CHLORIDE 0.9 % IV SOLN: INTRAVENOUS | Status: DC

## 2021-05-28 MED ORDER — SODIUM CHLORIDE 0.9% FLUSH
3.0000 mL | Freq: Two times a day (BID) | INTRAVENOUS | Status: DC
  Administered 2021-05-28 – 2021-06-01 (×3): 3 mL via INTRAVENOUS

## 2021-05-28 MED ORDER — DIGOXIN 125 MCG PO TABS
0.1250 mg | ORAL_TABLET | Freq: Every day | ORAL | Status: DC
Start: 2021-05-28 — End: 2021-06-03
  Administered 2021-05-28 – 2021-06-03 (×7): 0.125 mg via ORAL
  Filled 2021-05-28 (×7): qty 1

## 2021-05-28 MED ORDER — ASPIRIN 81 MG PO CHEW
81.0000 mg | CHEWABLE_TABLET | Freq: Every day | ORAL | Status: DC
  Administered 2021-05-28 – 2021-05-29 (×2): 81 mg via ORAL
  Filled 2021-05-28: qty 1

## 2021-05-28 MED ORDER — POTASSIUM CHLORIDE 20 MEQ PO PACK: 40.0000 meq | PACK | Freq: Two times a day (BID) | ORAL | Status: DC

## 2021-05-28 MED ORDER — CLONAZEPAM 0.5 MG PO TABS
0.5000 mg | ORAL_TABLET | Freq: Two times a day (BID) | ORAL | Status: AC
  Administered 2021-05-28 (×2): 0.5 mg via ORAL
  Filled 2021-05-28 (×2): qty 1

## 2021-05-28 MED ORDER — PANTOPRAZOLE SODIUM 40 MG PO TBEC
40.0000 mg | DELAYED_RELEASE_TABLET | Freq: Every day | ORAL | Status: DC
Start: 2021-05-28 — End: 2021-06-03
  Administered 2021-05-28 – 2021-06-03 (×7): 40 mg via ORAL
  Filled 2021-05-28 (×7): qty 1

## 2021-05-28 MED ORDER — SODIUM CHLORIDE 0.9 % IV SOLN: 250.0000 mL | INTRAVENOUS | Status: DC | PRN

## 2021-05-28 MED ORDER — ASPIRIN 81 MG PO CHEW
81.0000 mg | CHEWABLE_TABLET | ORAL | Status: AC
  Administered 2021-05-29: 81 mg via ORAL
  Filled 2021-05-28: qty 1

## 2021-05-28 MED ORDER — ATORVASTATIN CALCIUM 10 MG PO TABS
20.0000 mg | ORAL_TABLET | Freq: Every day | ORAL | Status: DC
  Administered 2021-05-28 – 2021-06-03 (×7): 20 mg via ORAL
  Filled 2021-05-28 (×7): qty 2

## 2021-05-28 MED ORDER — ENSURE ENLIVE PO LIQD
237.0000 mL | Freq: Three times a day (TID) | ORAL | Status: DC
  Administered 2021-06-02: 237 mL via ORAL

## 2021-05-28 MED ORDER — ACETAZOLAMIDE 250 MG PO TABS
250.0000 mg | ORAL_TABLET | Freq: Once | ORAL | Status: AC
  Administered 2021-05-28: 250 mg via ORAL
  Filled 2021-05-28: qty 1

## 2021-05-28 NOTE — TOC Progression Note (Signed)
Transition of Care (TOC) - Initial/Assessment Note  ? ? ?Patient Details  ?Name: [name] ?MRN: [id number] ?Date of Birth: [birth year] ? ?Transition of Care (TOC) CM/SW Contact:    ?Catalina Pizza Zebulun Deman, LCSWA ?Phone Number: ?05/28/2021, 4:22 PM ? ?Clinical Narrative:                 ?Patient is a potential candidate for CIR.   ? ?TOC distantly following for any d/c needs.   ? ?  ?  ? ? ?Patient Goals and CMS Choice ?  ?  ?  ? ?Expected Discharge Plan and Services ?  ?  ?  ?  ?  ?                ?  ?  ?  ?  ?  ?  ?  ?  ?  ?  ? ?Prior Living Arrangements/Services ?  ?  ?  ?       ?  ?  ?  ?  ? ?Activities of Daily Living ?  ?  ? ?Permission Sought/Granted ?  ?  ?   ?   ?   ?   ? ?Emotional Assessment ?  ?  ?  ?  ?  ?  ? ?Admission diagnosis:  Acute respiratory failure with hypercapnia (HCC) [J96.02] ?Hypercapnic respiratory failure (HCC) [J96.92] ?Sepsis, due to unspecified organism, unspecified whether acute organ dysfunction present (HCC) [A41.9] ?Patient Active Problem List  ? Diagnosis Date Noted  ? HFrEF (heart failure with reduced ejection fraction) (HCC) 05/19/2021  ? Cor pulmonale, acute (HCC) 05/19/2021  ? ARDS (adult respiratory distress syndrome) (HCC)   ? Septic shock (HCC)   ? Acute encephalopathy   ? Factor V Leiden mutation (HCC) 02/14/2021  ? Respiratory failure with hypoxia and hypercapnia (HCC) 02/14/2021  ? Lactic acidosis 02/14/2021  ? H/O gastroesophageal reflux (GERD) 02/14/2021  ? Hyperlipidemia 02/14/2021  ? Generalized weakness 08/31/2016  ? History of embolectomy of left arm.  12/11/2015  ? Depression with anxiety 12/11/2015  ? Essential hypertension 12/11/2015  ? Chronic back pain 12/11/2015  ? Ischemia of hand 12/06/2015  ? 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 Knee pain 05/31/2012  ? Bilateral leg weakness 11/20/2010  ? ?PCP:  Gareth Morgan, MD ?Pharmacy:   ?[facility] - [place] - [address] ?[address] ?[place] [address] ?Phone: [phone] Fax:  [phone] ? ? ? ? ?Social Determinants of Health (SDOH) Interventions ?  ? ?Readmission Risk Interventions ?   ? View : No data to display.  ?  ?  ?  ? ? ? ?

## 2021-05-28 NOTE — Progress Notes (Signed)
Patient on schedule for 5/18 heart cath and 5/19 TEE with possible cardioversion. Patient states cannot sign for own consents at this time. Has had some intermittent confusion per report, and currently in soft hand mitts. Paged [facility] to notify and relay that daughter, first contact listed per patient, has been main contact for procedures and has signed previous consents.  ?

## 2021-05-28 NOTE — Evaluation (Signed)
Occupational Therapy Evaluation ?Patient Details ?Name: [name] ?MRN: [id number] ?DOB: [birth year] ?Today's Date: 05/28/2021 ? ? ?History of Present Illness Pt adm 5/5  with sepsis, aspiration PNA and respiratory failure. Intubated 5/5 - 5/16. +amphetamines/benzos. Pt with acute biventricular heart failure and aflutter.  PMH - Leiden Factor V, HTN, chronic laryngitis, anxiety, chronic pain, back surgery, neck surgery  ? ?Clinical Impression ?  ?Pt admitted for concerns listed above. PTA pt reported that she was independent with all ADL's and IADL's, using a rollator as needed. At this time, pt presents with increased weakness and balance deficits, as well as slow processing/cognitive deficits. She is requiring min-max A with BADL's and min-mod A +2 for functional mobility. Recommending AIR to maximize pt independence and improve safety. OT will follow acutely.   ?   ? ?Recommendations for follow up therapy are one component of a multi-disciplinary discharge planning process, led by the attending physician.  Recommendations may be updated based on patient status, additional functional criteria and insurance authorization.  ? ?Follow Up Recommendations ? Acute inpatient rehab (3hours/day)  ?  ?Assistance Recommended at Discharge Frequent or constant Supervision/Assistance  ?Patient can return home with the following Two people to help with walking and/or transfers;Two people to help with bathing/dressing/bathroom;Assistance with feeding;Assistance with cooking/housework;Direct supervision/assist for medications management;Direct supervision/assist for financial management;Assist for transportation;Help with stairs or ramp for entrance ? ?  ?Functional Status Assessment ? Patient has had a recent decline in their functional status and demonstrates the ability to make significant improvements in function in a reasonable and predictable amount of time.  ?Equipment Recommendations ? Tub/shower bench  ?   ?Recommendations for Other Services Rehab consult ? ? ?  ?Precautions / Restrictions Precautions ?Precautions: Fall ?Restrictions ?Weight Bearing Restrictions: No  ? ?  ? ?Mobility Bed Mobility ?Overal bed mobility: Needs Assistance ?Bed Mobility: Supine to Sit ?  ?  ?Supine to sit: Mod assist, +2 for safety/equipment, HOB elevated ?  ?  ?General bed mobility comments: Assist to bring legs off of bed, elevate trunk into sitting and bring hips to EOB. ?  ? ?Transfers ?Overall transfer level: Needs assistance ?Equipment used: Rollator (4 wheels) ?Transfers: Sit to/from Stand, Bed to chair/wheelchair/BSC ?Sit to Stand: +2 physical assistance, Min assist ?  ?  ?Step pivot transfers: +2 physical assistance, Min assist ?  ?  ?General transfer comment: Assist to bring hips up and for balance. Pt slow to rise. Small pivotal steps with rollator for bed to chair with assist for balance and support. ?  ? ?  ?Balance Overall balance assessment: Needs assistance ?Sitting-balance support: Bilateral upper extremity supported, Feet supported ?Sitting balance-[name] Scale: Poor ?Sitting balance - Comments: UE support.Instability of bed contributing to difficulty. ?  ?Standing balance support: Bilateral upper extremity supported, During functional activity, Reliant on assistive device for balance ?Standing balance-[name] Scale: Poor ?Standing balance comment: rollator and min assist for static standing ?  ?  ?  ?  ?  ?  ?  ?  ?  ?  ?  ?   ? ?ADL either performed or assessed with clinical judgement  ? ?ADL Overall ADL's : Needs assistance/impaired ?Eating/Feeding: Minimal assistance;Sitting ?  ?Grooming: Moderate assistance;Sitting ?  ?Upper Body Bathing: Moderate assistance;Sitting ?  ?Lower Body Bathing: Maximal assistance;+2 for safety/equipment;Sitting/lateral leans;Sit to/from stand ?  ?Upper Body Dressing : Moderate assistance;Sitting ?  ?Lower Body Dressing: Maximal assistance;+2 for safety/equipment;Sitting/lateral leans;Sit  to/from stand ?  ?Toilet Transfer: Moderate assistance;+2 for physical assistance;+2 for  safety/equipment;Stand-pivot ?  ?Toileting- Clothing Manipulation and Hygiene: Moderate assistance;+2 for safety/equipment;Sitting/lateral lean;Sit to/from stand ?  ?  ?  ?Functional mobility during ADLs: Moderate assistance;+2 for physical assistance;+2 for safety/equipment;Rollator (4 wheels) ?General ADL Comments: Pt globally weak and requiiring +1-2 assist for all ADL's  ? ? ? ?Vision Baseline Vision/History: 1 Wears glasses ?Ability to See in Adequate Light: 0 Adequate ?Patient Visual Report: No change from baseline ?Vision Assessment?: No apparent visual deficits  ?   ?Perception   ?  ?Praxis   ?  ? ?Pertinent Vitals/Pain Pain Assessment ?Pain Assessment: 0-10 ?Pain Score: 10-Worst pain ever ?Pain Location: buttocks ?Pain Descriptors / Indicators: Grimacing ?Pain Intervention(s): Repositioned, Monitored during session  ? ? ? ?Hand Dominance Right ?  ?Extremity/Trunk Assessment Upper Extremity Assessment ?Upper Extremity Assessment: Generalized weakness ?  ?Lower Extremity Assessment ?Lower Extremity Assessment: Defer to PT evaluation ?  ?Cervical / Trunk Assessment ?Cervical / Trunk Assessment: Kyphotic ?  ?Communication Communication ?Communication: Expressive difficulties (Low volume and difficult to understand at times) ?  ?Cognition Arousal/Alertness: Awake/alert ?Behavior During Therapy: Flat affect ?Overall Cognitive Status: Impaired/Different from baseline ?Area of Impairment: Attention, Following commands, Safety/judgement, Awareness, Problem solving ?  ?  ?  ?  ?  ?  ?  ?  ?  ?Current Attention Level: Selective ?  ?Following Commands: Follows one step commands consistently, Follows one step commands with increased time ?Safety/Judgement: Decreased awareness of deficits, Decreased awareness of safety ?Awareness: Intellectual ?Problem Solving: Slow processing, Decreased initiation, Difficulty sequencing, Requires  verbal cues, Requires tactile cues ?General Comments: Slow to respond, repetitive at times with responses ?  ?  ?General Comments  Pt on 2L O2 at rest with SpO2 98%. Removed O2 and pt >90% on RA. Left O2 off with SpO2 94% ? ?  ?Exercises   ?  ?Shoulder Instructions    ? ? ?Home Living Family/patient expects to be discharged to:: Private residence ?Living Arrangements: Spouse/significant other;Children (boyfriend and son) ?Available Help at Discharge: Family;Available 24 hours/day ?Type of Home: Mobile home ?Home Access: Stairs to enter ?Entrance Stairs-Number of Steps: 2 ?Entrance Stairs-Rails: Right ?Home Layout: One level ?  ?  ?Bathroom Shower/Tub: Tub/shower unit ?  ?Bathroom Toilet: Standard ?  ?  ?Home Equipment: Rollator (4 wheels);BSC/3in1 ?  ?  ?  ? ?  ?Prior Functioning/Environment Prior Level of Function : Independent/Modified Independent ?  ?  ?  ?  ?  ?  ?Mobility Comments: Uses rollator ?ADLs Comments: modified independent with ADL's ?  ? ?  ?  ?OT Problem List: Decreased strength;Decreased activity tolerance;Decreased range of motion;Impaired balance (sitting and/or standing);Decreased safety awareness;Decreased knowledge of use of DME or AE;Pain ?  ?   ?OT Treatment/Interventions: Self-care/ADL training;Therapeutic exercise;Energy conservation;DME and/or AE instruction;Therapeutic activities;Patient/family education;Balance training;Cognitive remediation/compensation  ?  ?OT Goals(Current goals can be found in the care plan section) Acute Rehab OT Goals ?Patient Stated Goal: To get stronger ?OT Goal Formulation: With patient ?Time For Goal Achievement: 06/11/21 ?Potential to Achieve Goals: Good ?ADL Goals ?Pt Will Perform Eating: with modified independence;sitting ?Pt Will Perform Grooming: with modified independence;sitting ?Pt Will Perform Lower Body Bathing: with modified independence;sitting/lateral leans;sit to/from stand ?Pt Will Perform Lower Body Dressing: with modified  independence;sitting/lateral leans;sit to/from stand ?Pt Will Transfer to Toilet: with modified independence;ambulating ?Pt Will Perform Toileting - Clothing Manipulation and hygiene: with modified independence;sitting/lateral leans;sit to/f

## 2021-05-28 NOTE — H&P (View-Only) (Signed)
ANTICOAGULATION CONSULT NOTE  Pharmacy Consult:  Heparin Indication: atrial fibrillation  Allergies  Allergen Reactions   Penicillins Anaphylaxis    Has patient had a PCN reaction causing immediate rash, facial/tongue/throat swelling, SOB or lightheadedness with hypotension: No Has patient had a PCN reaction causing severe rash involving mucus membranes or skin necrosis: No Has patient had a PCN reaction that required hospitalization No Has patient had a PCN reaction occurring within the last 10 years: No If all of the above answers are "NO", then may proceed with Cephalosporin use.    Ibuprofen Nausea And Vomiting    GI upset, Shakes   Peppermint Flavor [Flavoring Agent]     Allergic to pepper the spice   Aspirin Nausea And Vomiting    Gi upset    Patient Measurements: Height: 5\' 3"  (160 cm) Weight: 109.5 kg (241 lb 6.5 oz) IBW/kg (Calculated) : 52.4 Heparin Dosing Weight: 74 kg  Vital Signs: Temp: 99.1 F (37.3 C) (05/17 0319) Temp Source: Bladder (05/17 0319) BP: 113/53 (05/17 0600) Pulse Rate: 130 (05/17 0600)  Labs: Recent Labs    05/26/21 0315 05/26/21 0359 05/27/21 0306 05/27/21 0458 05/27/21 2000 05/28/21 0248 05/28/21 0428 05/28/21 0439 05/28/21 0537  HGB 14.3   < > 13.9   < >  --  14.3 14.3 14.6  --   HCT 43.6   < > 42.9   < >  --  43.1 42.0 43.0  --   PLT 198  --  228  --   --  274  --   --   --   HEPARINUNFRC 0.29*  --  0.36  --   --   --   --   --  0.35  CREATININE 0.95  --  0.94  --  0.95 0.99  --   --   --    < > = values in this interval not displayed.     Estimated Creatinine Clearance: 80.7 mL/min (by C-G formula based on SCr of 0.99 mg/dL).   Assessment: 45 YOF initially started on IV heparin for rule out PE, then switched to heparin SQ for VTE prophylaxis.  Now with persistent Afib (CHADSVASC 3), so Pharmacy consulted to resume IV heparin.  Heparin level therapeutic; CBC stable; no bleeding reported.   Goal of Therapy:  Heparin  level 0.3-0.7 units/ml Monitor platelets by anticoagulation protocol: Yes   Plan:  Continue heparin gtt at 1700 units/hr Daily heparin level and CBC   Dainelle Hun D. Mina Marble, PharmD, BCPS, [place] 05/28/2021, 7:40 AM

## 2021-05-28 NOTE — Progress Notes (Signed)
ANTICOAGULATION CONSULT NOTE ? ?Pharmacy Consult:  Heparin ?Indication: atrial fibrillation ? ?Allergies  ?Allergen Reactions  ? Penicillins Anaphylaxis  ?  Has patient had a PCN reaction causing immediate rash, facial/tongue/throat swelling, SOB or lightheadedness with hypotension: No ?Has patient had a PCN reaction causing severe rash involving mucus membranes or skin necrosis: No ?Has patient had a PCN reaction that required hospitalization No ?Has patient had a PCN reaction occurring within the last 10 years: No ?If all of the above answers are "NO", then may proceed with Cephalosporin use. ?  ? Ibuprofen Nausea And Vomiting  ?  GI upset, Shakes  ? Peppermint Flavor [Flavoring Agent]   ?  Allergic to pepper the spice  ? Aspirin Nausea And Vomiting  ?  Gi upset  ? ? ?Patient Measurements: ?Height: 5\' 3"  (160 cm) ?Weight: 109.5 kg (241 lb 6.5 oz) ?IBW/kg (Calculated) : 52.4 ?Heparin Dosing Weight: 74 kg ? ?Vital Signs: ?Temp: 99.1 ?F (37.3 ?C) (05/17 0319) ?Temp Source: Bladder (05/17 0319) ?BP: 113/53 (05/17 0600) ?Pulse Rate: 130 (05/17 0600) ? ?Labs: ?Recent Labs  ?  05/26/21 ?[id number] 05/26/21 ?[id number] 05/27/21 ?[id number] 05/27/21 ?[id number] 05/27/21 ?2000 05/28/21 ?[id number] 05/28/21 ?[id number] 05/28/21 ?[id number] 05/28/21 ?[id number]  ?HGB 14.3   < > 13.9   < >  --  14.3 14.3 14.6  --   ?HCT 43.6   < > 42.9   < >  --  43.1 42.0 43.0  --   ?PLT 198  --  228  --   --  274  --   --   --   ?HEPARINUNFRC 0.29*  --  0.36  --   --   --   --   --  0.35  ?CREATININE 0.95  --  0.94  --  0.95 0.99  --   --   --   ? < > = values in this interval not displayed.  ? ? ? ?Estimated Creatinine Clearance: 80.7 mL/min (by C-G formula based on SCr of 0.99 mg/dL). ? ? ?Assessment: ?74 YOF initially started on IV heparin for rule out PE, then switched to heparin SQ for VTE prophylaxis.  Now with persistent Afib (CHADSVASC 3), so Pharmacy consulted to resume IV heparin. ? ?Heparin level therapeutic; CBC stable; no bleeding reported.  ? ?Goal of Therapy:  ?Heparin  level 0.3-0.7 units/ml ?Monitor platelets by anticoagulation protocol: Yes ?  ?Plan:  ?Continue heparin gtt at 1700 units/hr ?Daily heparin level and CBC  ? ?Adiyah Lame D. Mina Marble, PharmD, BCPS, BCCCP ?05/28/2021, 7:40 AM ? ?

## 2021-05-28 NOTE — Progress Notes (Addendum)
? ?NAME:  [name], MRN:  [id number], DOB:  1970-11-10, LOS: 1 ?ADMISSION DATE:  05/16/2021, CONSULTATION DATE:  5/5 ?REFERRING MD:  Dr. Sabra Heck CHIEF COMPLAINT:  ARF  ? ?History of Present Illness:  ?51 year old female with pertinent PMH of anxiety, depression, chronic pain syndrome on multiple psych meds presents to [facility] on 5/5 with acute respiratory failure. ? ?Patient recently admitted 02/14/2021 with similar episode of respiratory failure with sepsis and aspiration pneumonia and patient was intubated.  Patient left AMA on 02/21/2021.  Patient at home takes multiple psych meds: Xanax, amitriptyline, Lyrica, Trintellix, Ambien, citalopram. ?  ?On 5/5, family went to see patient at home and found unresponsive on floor.  EMS called and patient was unresponsive and sats in 77s.  Patient bagged and transported to [facility] ED.  On arrival to [facility] ED patient GCS very low.  Breathing spontaneously but requiring BVM.  Intubated for hypercarbic hypoxic respiratory failure. ? ?Past Medical History:  ?Anxiety / Depression  ?Panic Attacks  ?Chronic Back Pain  ?Factor 5 Leiden Mutation, heterozygous  ?HTN  ?Scoliosis  ?Ulcer  ? ?Significant Hospital Events:  ?5/5 admitted to [facility] intubated for resp failure; transferring to [facility]  ?5/6 slight hypoxia this a.m. on vent requiring increase of PEEP from 5-8 and up titration of FiO2 from 70-100 ?5/7 notified of positive blood cultures for night, antibiotic regiment remains seen with broad coverage.  She is more alert and oriented this a.m. ?5/8 severe LV and RV dysfunction, LVEF 20%.  Persistent hypoxia and hypercarbia.  ?5/9 weaned off paralytics and milnirone  ?5/10 weaned off of epoprostenol.  Vanco stopped.  ?5/11 weaned off of levophed.  Levaquin  ?5/12 poor synchrony on MV, increasing sedation and restart paralytics. Amiodarone restarted for aflutter. PICC line placed  ?5/13 improved ventilator synchrony.  Now 5 L net negative.  Improving oxygenation. ?5/14 Hypercarbia corrected on 8  cc/kg.  Oxygenation remained steady overnight.  Patient will open her eyes and squeeze hands to command. ?5/15: echo with LVEF 20-25% and RV mildly reduced ?5/16: extubated to bipap ? ? ?Interim History / Subjective:  ?On 4 L [place] ?Aox3; MAE ?Remains tachy 130s ?On IV lasix, heparin, and amio ?UOP 9 L last 24 hours ? ?Objective   ?Blood pressure (!) 113/53, pulse (!) 130, temperature 99.4 ?F (37.4 ?C), temperature source Oral, resp. rate 14, height 5\' 3"  (1.6 m), weight 109.5 kg, SpO2 92 %. ?CVP:  [8 mmHg-20 mmHg] 8 mmHg  ?Vent Mode: BIPAP;PCV ?FiO2 (%):  [40 %-100 %] 40 % ?Set Rate:  [8 bmp] 8 bmp ?PEEP:  [5 cmH20] 5 cmH20 ?Pressure Support:  [5 cmH20] 5 cmH20  ? ?Intake/Output Summary (Last 24 hours) at 05/28/2021 0805 ?Last data filed at 05/28/2021 0600 ?Gross per 24 hour  ?Intake 1977.34 ml  ?Output 9075 ml  ?Net -7097.66 ml  ? ? ?Filed Weights  ? 05/25/21 0453 05/26/21 0500 05/27/21 0334  ?Weight: 113 kg 109.8 kg 109.5 kg  ? ? ?Examination: ?General:  NAD ?HEENT: MM pink/moist; [place] in place ?Neuro: Aox3; MAE; PERRL ?CV: s1s2, afib 130s ?PULM:  dim clear BS bilaterally; on 4 L  ?GI: soft, bsx4 active  ?Extremities: warm/dry, BLE 1+ edema  ?Skin: no rashes or lesions appreciated ? ?Resolved Hospital Problem list   ?Shock liver ?Septic shock  ?Severe Sepsis ? ?Assessment & Plan:  ? ?Cor pulmonale, Acute ([place]) ?HFrEF (heart failure with reduced ejection fraction) ?EF 20%, change from echo 2 years ago.  Possibly sepsis induced myocardial dysfunction. ?  Likely also component of diastolic dysfunction with restrictive filling from RV pressure volume overload. ?Severe RV dysfunction on echocardiogram with pressue volume overload.  Developed tachycardia with milrinone. Milrinone off, off epoprostenol.  Follow-up point-of-care echo shows persistent RV dysfunction with  septal dyssynchrony. SCV O2 remains normal ?-Echo 5/15: LVEF 20-25%; RV mildly reduced and enlarged; normal pulm artery pressures; L atria and R atria severely  dilated ?P: ?-HF team following; appreciate recs ?-continue diuretics per HF ?-digoxin added yesterday ?-started on spironolactone and diamox today ?-trend coox/cvp ?-trend bmp/mag for goal k >4 and mag >2 ? ?Respiratory failure with hypoxia and hypercapnia (HCC) ?Refractory hypoxia and hypercarbia due to predominantly right-sided airspace disease (PNA) and possible superimposed pulmonary edema. Bilateral airspace disease predominantly on the right side due to right lung aspiration.  Asymmetry limits utility of PEEP. Improved oxygenation with epoprostenol, diuresis and reduction in PEEP to prevent over distention. Off paralytics. Hypercarbia on 6cc/kg with dyssynchrony.  Completed abx.  ?P: ?-extubated yesterday ?-cont to wean  for sats >92% ?-cont diuretics ?-pulm toiletry: is/flutter ?-PT/OT ?-bipap qhs and prn ? ?AF / Flutter  ?P: ?-telemetry monitoring ?-cont amio and heparin gtt ? ?Acute Metabolic Encephalopathy ?Required NMB. Now lightening sedation.  ?P: ?-limit sedating meds ? ?Hypokalemia  ?P: ?-trend K/mag and replete as needed with ongoing diuresis ? ? ?Best Practice (right click and "Reselect all SmartList Selections" daily)  ? ?Diet/type: Regular consistency (see orders) ?DVT prophylaxis: systemic heparin ?GI prophylaxis: PPI ?Lines: Central line ?Foley:  Yes, and it is still needed ?Code Status:  DNR ?Last date of multidisciplinary goals of care discussion [place]: 5/16 spoke with Museum/gallery conservator (daughter) over phone and she states that she would be okay with tracheostomy if we are unable to wean from ventilator. She would like to continue DNR but okay with intubation] ? ? ? ?  ? ?Mikki Harbor, PA-C ?[facility] ?05/28/2021, 8:05 AM ? ?Please see [email or web address] for pager details. ? ?From 7A-7P if no response, please call [phone]. ?After hours, please call ELink [phone]. ? ?

## 2021-05-28 NOTE — Progress Notes (Signed)
PT tolerated Bipap for two hours. Pt back on 6L [place] and tolerating well. Will continue to monitor.  ?

## 2021-05-28 NOTE — Progress Notes (Signed)
Patient has CPAP order for night rest.  Patient is currently wearing mittens.  Rn aware patient will have to continue on nasal cannula if mittens are worn all night.   ?

## 2021-05-28 NOTE — Progress Notes (Signed)
Patient ID: [name], female   DOB: [birth year], 51 y.o.   MRN: [id number] ?  ? ? Advanced Heart Failure Rounding Note ? ?PCP-Cardiologist: None  ? ?Subjective:   ? ?Patient diuresed very well yesterday, net I/Os [id number].  Co-x 74%, CVP 10 cm.  Lasix gtt 12 mg/hr.  ? ?She was extubated and is awake, oriented to place.  ? ?Remains in atrial flutter with rate 120s on amiodarone gtt + heparin gtt.  ? ? ?Objective:   ?Weight Range: ?109.5 kg ?Body mass index is 42.76 kg/m?.  ? ?Vital Signs:   ?Temp:  [97.9 ?F (36.6 ?C)-99.3 ?F (37.4 ?C)] 99.1 ?F (37.3 ?C) (05/17 0319) ?Pulse Rate:  [88-139] 130 (05/17 0600) ?Resp:  [11-26] 14 (05/17 0600) ?BP: (89-138)/(53-91) 113/53 (05/17 0600) ?SpO2:  [88 %-100 %] 92 % (05/17 0600) ?Arterial Line BP: (104-140)/(57-93) 113/75 (05/16 2000) ?FiO2 (%):  [40 %-100 %] 40 % (05/16 2344) ?Last BM Date : 05/27/21 ? ?Weight change: ?Filed Weights  ? 05/25/21 0453 05/26/21 0500 05/27/21 0334  ?Weight: 113 kg 109.8 kg 109.5 kg  ? ? ?Intake/Output:  ? ?Intake/Output Summary (Last 24 hours) at 05/28/2021 0648 ?Last data filed at 05/28/2021 0600 ?Gross per 24 hour  ?Intake 2152.01 ml  ?Output 9425 ml  ?Net -7272.99 ml  ?  ? ? ?Physical Exam  ? CVP 10  ?General: NAD ?Neck: No JVD, no thyromegaly or thyroid nodule.  ?Lungs: Clear to auscultation bilaterally with normal respiratory effort. ?CV: Nondisplaced PMI.  Heart tachy, regular S1/S2, no S3/S4, no murmur.  Trace ankle edema.   ?Abdomen: Soft, nontender, no hepatosplenomegaly, no distention.  ?Skin: Intact without lesions or rashes.  ?Neurologic: Alert and oriented x 3.  ?Psych: Normal affect. ?Extremities: No clubbing or cyanosis.  ?HEENT: Normal.  ? ?Telemetry  ? ?A flutter 120s personally reviewed ? ?EKG  ? N/A ? ?Labs  ?  ?CBC ?Recent Labs  ?  05/27/21 ?0306 05/27/21 ?0458 05/28/21 ?0248 05/28/21 ?0428 05/28/21 ?0439  ?WBC 5.9  --  9.4  --   --   ?HGB 13.9   < > 14.3 14.3 14.6  ?HCT 42.9   < > 43.1 42.0 43.0  ?MCV 97.5  --  95.1  --   --    ?PLT 228  --  274  --   --   ? < > = values in this interval not displayed.  ? ?Basic Metabolic Panel ?Recent Labs  ?  05/26/21 ?[id number] 05/26/21 ?[id number] 05/27/21 ?2000 05/28/21 ?[id number] 05/28/21 ?[id number] 05/28/21 ?[id number]  ?NA 139   < > 138 138 137 138  ?K 3.4*   < > 3.4* 4.8 3.4* 3.4*  ?CL 95*   < > 89* 89*  --   --   ?CO2 35*   < > 35* 35*  --   --   ?GLUCOSE 140*   < > 167* 157*  --   --   ?BUN 22*   < > 24* 25*  --   --   ?CREATININE 0.95   < > 0.95 0.99  --   --   ?CALCIUM 9.4   < > 10.2 9.8  --   --   ?MG 2.2  --   --  2.1  --   --   ? < > = values in this interval not displayed.  ? ?Liver Function Tests ?Recent Labs  ?  05/27/21 ?0306  ?AST 23  ?ALT 17  ?ALKPHOS 149*  ?BILITOT 0.7  ?PROT 6.6  ?  ALBUMIN 2.8*  ? ?No results for input(s): LIPASE, AMYLASE in the last 72 hours. ?Cardiac Enzymes ?No results for input(s): CKTOTAL, CKMB, CKMBINDEX, TROPONINI in the last 72 hours. ? ?BNP: ?BNP (last 3 results) ?Recent Labs  ?  02/14/21 ?0742 02/14/21 ?2153 05/17/21 ?0415  ?BNP 309.0* 162.0* 782.6*  ? ? ?ProBNP (last 3 results) ?No results for input(s): PROBNP in the last 8760 hours. ? ? ?D-Dimer ?No results for input(s): DDIMER in the last 72 hours. ?Hemoglobin A1C ?No results for input(s): HGBA1C in the last 72 hours. ?Fasting Lipid Panel ?Recent Labs  ?  05/26/21 ?0315  ?TRIG 303*  ? ?Thyroid Function Tests ?No results for input(s): TSH, T4TOTAL, T3FREE, THYROIDAB in the last 72 hours. ? ?Invalid input(s): FREET3 ? ?Other results: ? ? ?Imaging  ? ? ?No results found. ? ? ?Medications:   ? ? ?Scheduled Medications: ? arformoterol  15 mcg Nebulization BID  ? aspirin  81 mg Per Tube Daily  ? atorvastatin  20 mg Per Tube Daily  ? budesonide (PULMICORT) nebulizer solution  0.25 mg Nebulization BID  ? Chlorhexidine Gluconate Cloth  6 each Topical Q0600  ? clonazePAM  1 mg Per Tube BID  ? digoxin  0.125 mg Per Tube Daily  ? feeding supplement (PROSource TF)  45 mL Per Tube BID  ? insulin aspart  0-9 Units Subcutaneous Q4H  ? mouth  rinse  15 mL Mouth Rinse BID  ? pantoprazole sodium  40 mg Per Tube Daily  ? potassium chloride  40 mEq Per Tube TID  ? QUEtiapine  50 mg Per Tube BID  ? revefenacin  175 mcg Nebulization Daily  ? senna-docusate  1 tablet Per Tube BID  ? sodium chloride flush  10-40 mL Intracatheter Q12H  ? sodium chloride flush  3 mL Intravenous Q12H  ? ? ?Infusions: ? amiodarone 30 mg/hr (05/28/21 0600)  ? feeding supplement (VITAL 1.5 CAL) Stopped (05/27/21 1900)  ? furosemide (LASIX) 200 mg in dextrose 5% 100 mL (2mg /mL) infusion 12 mg/hr (05/28/21 0600)  ? heparin 1,700 Units/hr (05/28/21 0600)  ? ? ?PRN Medications: ?acetaminophen, ipratropium-albuterol, levalbuterol, oxyCODONE, sodium chloride flush ? ? ? ?Patient Profile  ?[name] is a 51 year old with a h/o chronic pain, anxiety, depression, and substance abuse.   ?  ?Admitted with respiratory failure + A flutter.  ? ? ?Assessment/Plan  ? ?1. Acute hypoxemic respiratory failure: Suspect aspiration in setting of drug abuse (benzos/meth on UDS).  Has PNA + pulmonary edema. Sputum with Strep pneumo and S aureus.  Now extubated, on 4L [place].  Diuresing well.  ?- Continue diuresis today.  ?- Off abx.  ?2. Acute systolic CHF: No prior history of CHF.  Echo this admission showed LV EF < 20% with moderate LV dilation, severely decreased RV systolic function, moderate TR, normal IVC.  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 Also consider stress cardiomyopathy from sepsis/PNA.  Cannot rule out CAD. Off pressors, CVP down to 10 on Lasix gtt 12 mg/hr.  Creatinine stable 0.99. ?- Continue Lasix gtt but will decrease to 8 mg/hr.    ?- Give dose of acetazolamide 250 mg x 1 today with HCO3 35. .  ?- digoxin 0.125 mg daily.  ?- Add spironolactone 12.5 daily.  ?- Plan for coronary angiography tomorrow to rule out CAD.  ?- After cath, will need TEE-guided DCCV (see below).  ?- Add other GDMT as BP tolerates.  ?3.  Atrial flutter: She  appears to be in typical AFL.  AFL has been present since at least 2/23.  Concerned for component of tachy-mediated CMP.   ?- Continue amiodarone gtt.  ?- Heparin gtt.  ?- TEE-guided DCCV, probably Friday once fully diuresed.   ?- Would ideally have ablation down the road.  ?4. Substance abuse: UDS with methamphetamines.  ? ?CRITICAL CARE ?Performed by: Loralie Champagne ? ?Total critical care time: 35 minutes ? ?Critical care time was exclusive of separately billable procedures and treating other patients. ? ?Critical care was necessary to treat or prevent imminent or life-threatening deterioration. ? ?Critical care was time spent personally by me on the following activities: development of treatment plan with patient and/or surrogate as well as nursing, discussions with consultants, evaluation of patient's response to treatment, examination of patient, obtaining history from patient or surrogate, ordering and performing treatments and interventions, ordering and review of laboratory studies, ordering and review of radiographic studies, pulse oximetry and re-evaluation of patient's condition. ? ? ?Eutha Cude Aundra Dubin ?05/28/2021 ?6:48 AM ? ?

## 2021-05-28 NOTE — H&P (View-Only) (Signed)
Patient ID: [name], female   DOB: 1970-11-14, 51 y.o.   MRN: [id number]     Advanced Heart Failure Rounding Note  PCP-Cardiologist: None   Subjective:    Patient diuresed very well yesterday, net I/Os 6702.  Co-x 74%, CVP 10 cm.  Lasix gtt 12 mg/hr.   She was extubated and is awake, oriented to place.   Remains in atrial flutter with rate 120s on amiodarone gtt + heparin gtt.    Objective:   Weight Range: 109.5 kg Body mass index is 42.76 kg/m.   Vital Signs:   Temp:  [97.9 F (36.6 C)-99.3 F (37.4 C)] 99.1 F (37.3 C) (05/17 0319) Pulse Rate:  [88-139] 130 (05/17 0600) Resp:  [11-26] 14 (05/17 0600) BP: (89-138)/(53-91) 113/53 (05/17 0600) SpO2:  [88 %-100 %] 92 % (05/17 0600) Arterial Line BP: (104-140)/(57-93) 113/75 (05/16 2000) FiO2 (%):  [40 %-100 %] 40 % (05/16 2344) Last BM Date : 05/27/21  Weight change: Filed Weights   05/25/21 0453 05/26/21 0500 05/27/21 0334  Weight: 113 kg 109.8 kg 109.5 kg    Intake/Output:   Intake/Output Summary (Last 24 hours) at 05/28/2021 0648 Last data filed at 05/28/2021 0600 Gross per 24 hour  Intake 2152.01 ml  Output 9425 ml  Net -7272.99 ml      Physical Exam   CVP 10  General: NAD Neck: No JVD, no thyromegaly or thyroid nodule.  Lungs: Clear to auscultation bilaterally with normal respiratory effort. CV: Nondisplaced PMI.  Heart tachy, regular S1/S2, no S3/S4, no murmur.  Trace ankle edema.   Abdomen: Soft, nontender, no hepatosplenomegaly, no distention.  Skin: Intact without lesions or rashes.  Neurologic: Alert and oriented x 3.  Psych: Normal affect. Extremities: No clubbing or cyanosis.  HEENT: Normal.   Telemetry   A flutter 120s personally reviewed  EKG   N/A  Labs    CBC Recent Labs    05/27/21 0306 05/27/21 0458 05/28/21 0248 05/28/21 0428 05/28/21 0439  WBC 5.9  --  9.4  --   --   HGB 13.9   < > 14.3 14.3 14.6  HCT 42.9   < > 43.1 42.0 43.0  MCV 97.5  --  95.1  --   --    PLT 228  --  274  --   --    < > = values in this interval not displayed.   Basic Metabolic Panel Recent Labs    05/26/21 0315 05/26/21 0359 05/27/21 2000 05/28/21 0248 05/28/21 0428 05/28/21 0439  NA 139   < > 138 138 137 138  K 3.4*   < > 3.4* 4.8 3.4* 3.4*  CL 95*   < > 89* 89*  --   --   CO2 35*   < > 35* 35*  --   --   GLUCOSE 140*   < > 167* 157*  --   --   BUN 22*   < > 24* 25*  --   --   CREATININE 0.95   < > 0.95 0.99  --   --   CALCIUM 9.4   < > 10.2 9.8  --   --   MG 2.2  --   --  2.1  --   --    < > = values in this interval not displayed.   Liver Function Tests Recent Labs    05/27/21 0306  AST 23  ALT 17  ALKPHOS 149*  BILITOT 0.7  PROT 6.6  ALBUMIN 2.8*   No results for input(s): LIPASE, AMYLASE in the last 72 hours. Cardiac Enzymes No results for input(s): CKTOTAL, CKMB, CKMBINDEX, TROPONINI in the last 72 hours.  BNP: BNP (last 3 results) Recent Labs    02/14/21 0742 02/14/21 2153 05/17/21 0415  BNP 309.0* 162.0* 782.6*    ProBNP (last 3 results) No results for input(s): PROBNP in the last 8760 hours.   D-Dimer No results for input(s): DDIMER in the last 72 hours. Hemoglobin A1C No results for input(s): HGBA1C in the last 72 hours. Fasting Lipid Panel Recent Labs    05/26/21 0315  TRIG 303*   Thyroid Function Tests No results for input(s): TSH, T4TOTAL, T3FREE, THYROIDAB in the last 72 hours.  Invalid input(s): FREET3  Other results:   Imaging    No results found.   Medications:     Scheduled Medications:  arformoterol  15 mcg Nebulization BID   aspirin  81 mg Per Tube Daily   atorvastatin  20 mg Per Tube Daily   budesonide (PULMICORT) nebulizer solution  0.25 mg Nebulization BID   Chlorhexidine Gluconate Cloth  6 each Topical Q0600   clonazePAM  1 mg Per Tube BID   digoxin  0.125 mg Per Tube Daily   feeding supplement (PROSource TF)  45 mL Per Tube BID   insulin aspart  0-9 Units Subcutaneous Q4H   mouth  rinse  15 mL Mouth Rinse BID   pantoprazole sodium  40 mg Per Tube Daily   potassium chloride  40 mEq Per Tube TID   QUEtiapine  50 mg Per Tube BID   revefenacin  175 mcg Nebulization Daily   senna-docusate  1 tablet Per Tube BID   sodium chloride flush  10-40 mL Intracatheter Q12H   sodium chloride flush  3 mL Intravenous Q12H    Infusions:  amiodarone 30 mg/hr (05/28/21 0600)   feeding supplement (VITAL 1.5 CAL) Stopped (05/27/21 1900)   furosemide (LASIX) 200 mg in dextrose 5% 100 mL (2mg /mL) infusion 12 mg/hr (05/28/21 0600)   heparin 1,700 Units/hr (05/28/21 0600)    PRN Medications: acetaminophen, ipratropium-albuterol, levalbuterol, oxyCODONE, sodium chloride flush    Patient Profile  [name] is a 51 year old with a h/o chronic pain, anxiety, depression, and substance abuse.     Admitted with respiratory failure + A flutter.    Assessment/Plan   1. Acute hypoxemic respiratory failure: Suspect aspiration in setting of drug abuse (benzos/meth on UDS).  Has PNA + pulmonary edema. Sputum with Strep pneumo and S aureus.  Now extubated, on 4L [place].  Diuresing well.  - Continue diuresis today.  - Off abx.  2. Acute systolic CHF: No prior history of CHF.  Echo this admission showed LV EF < 20% with moderate LV dilation, severely decreased RV systolic function, moderate TR, normal IVC.  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 Also consider stress cardiomyopathy from sepsis/PNA.  Cannot rule out CAD. Off pressors, CVP down to 10 on Lasix gtt 12 mg/hr.  Creatinine stable 0.99. - Continue Lasix gtt but will decrease to 8 mg/hr.    - Give dose of acetazolamide 250 mg x 1 today with HCO3 35. .  - digoxin 0.125 mg daily.  - Add spironolactone 12.5 daily.  - Plan for coronary angiography tomorrow to rule out CAD.  - After cath, will need TEE-guided DCCV (see below).  - Add other GDMT as BP tolerates.  3.  Atrial flutter: She  appears to be in typical AFL.  AFL has been present since at least 2/23.  Concerned for component of tachy-mediated CMP.   - Continue amiodarone gtt.  - Heparin gtt.  - TEE-guided DCCV, probably Friday once fully diuresed.   - Would ideally have ablation down the road.  4. Substance abuse: UDS with methamphetamines.   CRITICAL CARE Performed by: Loralie Champagne  Total critical care time: 35 minutes  Critical care time was exclusive of separately billable procedures and treating other patients.  Critical care was necessary to treat or prevent imminent or life-threatening deterioration.  Critical care was time spent personally by me on the following activities: development of treatment plan with patient and/or surrogate as well as nursing, discussions with consultants, evaluation of patient's response to treatment, examination of patient, obtaining history from patient or surrogate, ordering and performing treatments and interventions, ordering and review of laboratory studies, ordering and review of radiographic studies, pulse oximetry and re-evaluation of patient's condition.   Loralie Champagne 05/28/2021 6:48 AM

## 2021-05-28 NOTE — Progress Notes (Addendum)
Paged by nurse, patient apparently was intermittent confused and cannot sign her consent for cath tomorrow.  ? ?I spoke with Dr. Aundra Dubin and also called the patient's daughter who is the first relative.  Her daughter lives in [place].  I have discussed the benefit and risk of both cardiac catheterization tomorrow and TEE DCCV on Friday, her daughter is agreed to consent for both procedure. ? ?Risk and benefit of cardiac catheterization explained to the patient's daughter who display clear understanding and agree to proceed. Discussed with patient possible procedural risk include bleeding, vascular injury, renal injury, arrythmia, MI, stroke and loss of limb or life. ? ? ?[facility] has been requested to perform a transesophageal echocardiogram with possible cardioversion for atrial flutter.  After careful review of history and examination, the risks and benefits of transesophageal echocardiogram have been explained including risks of esophageal damage, perforation (1:10,000 risk), bleeding, pharyngeal hematoma as well as other potential complications associated with anesthesia including aspiration, arrhythmia, respiratory failure and death. Alternatives to treatment were discussed, questions were answered.  Above risk and benefit has been discussed with patient's daughter who is willing to consent for her mother since patient is intermittently confused. ? ?Almyra Deforest, PA-C ?05/28/2021 8:42 PM  ? ?

## 2021-05-28 NOTE — Progress Notes (Signed)
? ?  Inpatient Rehab Admissions Coordinator : ? ?Per therapy recommendations, patient was screened for CIR candidacy by Alexiah Koroma RN MSN.  At this time patient appears to be a potential candidate for CIR. I will place a rehab consult per protocol for full assessment. Please call me with any questions. ? ?Dhalia Zingaro RN MSN ?Admissions Coordinator ?[phone] ?  ?

## 2021-05-28 NOTE — Evaluation (Signed)
Physical Therapy Evaluation ?Patient Details ?Name: [name] ?MRN: [id number] ?DOB: 14-Mar-1970 ?Today's Date: 05/28/2021 ? ?History of Present Illness ? Pt adm 5/5  with sepsis, aspiration PNA and respiratory failure. Intubated 5/5 - 5/16. +amphetamines/benzos. Pt with acute biventricular heart failure and aflutter.  PMH - Leiden Factor V, HTN, chronic laryngitis, anxiety, chronic pain, back surgery, neck surgery  ?Clinical Impression ? Pt presents to PT with decr mobility after extended hospitalization. Will need to be more mobile to return home with family and feel she could benefit from AIR to return to prior level of function. [place] admission but much weaker now.    ?   ? ?Recommendations for follow up therapy are one component of a multi-disciplinary discharge planning process, led by the attending physician.  Recommendations may be updated based on patient status, additional functional criteria and insurance authorization. ? ?Follow Up Recommendations Acute inpatient rehab (3hours/day) ? ?  ?Assistance Recommended at Discharge Frequent or constant Supervision/Assistance  ?Patient can return home with the following ? A lot of help with walking and/or transfers;Assistance with cooking/housework;Direct supervision/assist for medications management;Direct supervision/assist for financial management;Assist for transportation;Help with stairs or ramp for entrance;A lot of help with bathing/dressing/bathroom ? ?  ?Equipment Recommendations None recommended by PT  ?Recommendations for Other Services ? Rehab consult  ?  ?Functional Status Assessment Patient has had a recent decline in their functional status and demonstrates the ability to make significant improvements in function in a reasonable and predictable amount of time.  ? ?  ?Precautions / Restrictions Precautions ?Precautions: Fall  ? ?  ? ?Mobility ? Bed Mobility ?Overal bed mobility: Needs Assistance ?Bed Mobility: Supine to Sit ?  ?   ?Supine to sit: Mod assist, +2 for safety/equipment, HOB elevated ?  ?  ?General bed mobility comments: Assist to bring legs off of bed, elevate trunk into sitting and bring hips to EOB. ?  ? ?Transfers ?Overall transfer level: Needs assistance ?Equipment used: Rollator (4 wheels) ?Transfers: Sit to/from Stand, Bed to chair/wheelchair/BSC ?Sit to Stand: +2 physical assistance, Min assist ?  ?Step pivot transfers: +2 physical assistance, Min assist ?  ?  ?  ?General transfer comment: Assist to bring hips up and for balance. Pt slow to rise. Small pivotal steps with rollator for bed to chair with assist for balance and support. ?  ? ?Ambulation/Gait ?  ?  ?  ?  ?  ?  ?  ?  ? ?Stairs ?  ?  ?  ?  ?  ? ?Wheelchair Mobility ?  ? ?Modified Rankin (Stroke Patients Only) ?  ? ?  ? ?Balance Overall balance assessment: Needs assistance ?Sitting-balance support: Bilateral upper extremity supported, Feet supported ?Sitting balance-[name] Scale: Poor ?Sitting balance - Comments: UE support.Instability of bed contributing to difficulty. ?  ?Standing balance support: Bilateral upper extremity supported, During functional activity, Reliant on assistive device for balance ?Standing balance-[name] Scale: Poor ?Standing balance comment: rollator and min assist for static standing ?  ?  ?  ?  ?  ?  ?  ?  ?  ?  ?  ?   ? ? ? ?Pertinent Vitals/Pain Pain Assessment ?Pain Assessment: 0-10 ?Pain Score: 10-Worst pain ever ?Pain Location: buttocks ?Pain Descriptors / Indicators: Grimacing ?Pain Intervention(s): Limited activity within patient's tolerance, Repositioned  ? ? ?Home Living Family/patient expects to be discharged to:: Private residence ?Living Arrangements: Spouse/significant other;Children (boyfriend and son) ?Available Help at Discharge: Family;Available 24 hours/day ?Type of Home:  Mobile home ?Home Access: Stairs to enter ?Entrance Stairs-Rails: Right ?Entrance Stairs-Number of Steps: 2 ?  ?Home Layout: One level ?Home Equipment:  Rollator (4 wheels);BSC/3in1 ?   ?  ?Prior Function Prior Level of Function : Independent/Modified Independent ?  ?  ?  ?  ?  ?  ?Mobility Comments: Uses rollator ?ADLs Comments: modified independent with ADL's ?  ? ? ?Hand Dominance  ? Dominant Hand: Right ? ?  ?Extremity/Trunk Assessment  ? Upper Extremity Assessment ?Upper Extremity Assessment: Defer to OT evaluation ?  ? ?Lower Extremity Assessment ?Lower Extremity Assessment: Generalized weakness ?  ? ?   ?Communication  ? Communication: Expressive difficulties (Low volume and difficult to understand at times)  ?Cognition Arousal/Alertness: Awake/alert ?Behavior During Therapy: Flat affect ?Overall Cognitive Status: Impaired/Different from baseline ?Area of Impairment: Attention, Following commands, Safety/judgement, Awareness, Problem solving ?  ?  ?  ?  ?  ?  ?  ?  ?  ?Current Attention Level: Selective ?  ?Following Commands: Follows one step commands consistently, Follows one step commands with increased time ?Safety/Judgement: Decreased awareness of deficits, Decreased awareness of safety ?Awareness: Intellectual ?Problem Solving: Slow processing, Decreased initiation, Difficulty sequencing, Requires verbal cues, Requires tactile cues ?  ?  ?  ? ?  ?General Comments General comments (skin integrity, edema, etc.): Pt on 2L O2 at rest with SpO2 98%. Removed O2 and pt >90% on RA. Left O2 off with SpO2 94% ? ?  ?Exercises    ? ?Assessment/Plan  ?  ?PT Assessment Patient needs continued PT services  ?PT Problem List Decreased strength;Decreased activity tolerance;Decreased balance;Decreased mobility ? ?   ?  ?PT Treatment Interventions DME instruction;Gait training;Stair training;Functional mobility training;Therapeutic activities;Therapeutic exercise;Balance training;Patient/family education   ? ?PT Goals (Current goals can be found in the Care Plan section)  ?Acute Rehab PT Goals ?Patient Stated Goal: home ?PT Goal Formulation: With patient ?Time For Goal  Achievement: 06/11/21 ?Potential to Achieve Goals: Good ? ?  ?Frequency Min 3X/week ?  ? ? ?Co-evaluation PT/OT/SLP Co-Evaluation/Treatment: Yes ?Reason for Co-Treatment: For patient/therapist safety ?PT goals addressed during session: Mobility/safety with mobility;Balance ?  ?  ? ? ?  ?AM-PAC PT "6 Clicks" Mobility  ?Outcome Measure Help needed turning from your back to your side while in a flat bed without using bedrails?: A Little ?Help needed moving from lying on your back to sitting on the side of a flat bed without using bedrails?: A Lot ?Help needed moving to and from a bed to a chair (including a wheelchair)?: Total ?Help needed standing up from a chair using your arms (e.g., wheelchair or bedside chair)?: Total ?Help needed to walk in hospital room?: Total ?Help needed climbing 3-5 steps with a railing? : Total ?6 Click Score: 9 ? ?  ?End of Session Equipment Utilized During Treatment: Gait belt ?Activity Tolerance: Patient tolerated treatment well ?Patient left: in chair;with call bell/phone within reach;with chair alarm set ?Nurse Communication: Mobility status ?PT Visit Diagnosis: Unsteadiness on feet (R26.81);Other abnormalities of gait and mobility (R26.89);Muscle weakness (generalized) (M62.81) ?  ? ?Time: [id number] ?PT Time Calculation (min) (ACUTE ONLY): 37 min ? ? ?Charges:   PT Evaluation ?$PT Eval Moderate Complexity: 1 Mod ?  ?  ?   ? ? ?[facility] PT ?Acute Rehabilitation Services ?Office [phone] ? ? ?Shary Decamp [facility] ?05/28/2021, 1:13 PM ? ?

## 2021-05-28 NOTE — Procedures (Signed)
Cortrak  Tube Type:  Cortrak - 43 inches Tube Location:  Right nare Initial Placement:  Stomach Secured by: Bridle Technique Used to Measure Tube Placement:  Marking at nare/corner of mouth Cortrak Secured At:  65 cm  Cortrak Tube Team Note:  Consult received to place a Cortrak feeding tube.   X-ray is required, abdominal x-ray has been ordered by the Cortrak team. Please confirm tube placement before using the Cortrak tube.   If the tube becomes dislodged please keep the tube and contact the Cortrak team at [email or web address] ([email or web address]) for replacement.  If after hours and replacement cannot be delayed, place a NG tube and confirm placement with an abdominal x-ray.    Lancer Thurner MS, RD, LDN Please refer to AMION for RD and/or RD on-call/weekend/after hours pager   

## 2021-05-29 ENCOUNTER — Other Ambulatory Visit (HOSPITAL_COMMUNITY): Payer: Self-pay

## 2021-05-29 ENCOUNTER — Encounter (HOSPITAL_COMMUNITY)
Admission: EM | Disposition: A | Discharge status: Home Health | Payer: Self-pay | Source: Home / Self Care | Attending: Pulmonary Disease

## 2021-05-29 ENCOUNTER — Encounter (HOSPITAL_COMMUNITY): Payer: Self-pay | Admitting: Cardiology

## 2021-05-29 DIAGNOSIS — R6521 Severe sepsis with septic shock: Secondary | ICD-10-CM | POA: Diagnosis not present

## 2021-05-29 DIAGNOSIS — I5043 Acute on chronic combined systolic (congestive) and diastolic (congestive) heart failure: Secondary | ICD-10-CM | POA: Diagnosis not present

## 2021-05-29 DIAGNOSIS — J9692 Respiratory failure, unspecified with hypercapnia: Secondary | ICD-10-CM

## 2021-05-29 DIAGNOSIS — K7200 Acute and subacute hepatic failure without coma: Secondary | ICD-10-CM

## 2021-05-29 DIAGNOSIS — J80 Acute respiratory distress syndrome: Secondary | ICD-10-CM | POA: Diagnosis not present

## 2021-05-29 DIAGNOSIS — J9601 Acute respiratory failure with hypoxia: Secondary | ICD-10-CM | POA: Diagnosis not present

## 2021-05-29 DIAGNOSIS — I4892 Unspecified atrial flutter: Secondary | ICD-10-CM | POA: Diagnosis not present

## 2021-05-29 DIAGNOSIS — I2609 Other pulmonary embolism with acute cor pulmonale: Secondary | ICD-10-CM

## 2021-05-29 DIAGNOSIS — A419 Sepsis, unspecified organism: Secondary | ICD-10-CM | POA: Diagnosis not present

## 2021-05-29 DIAGNOSIS — G9340 Encephalopathy, unspecified: Secondary | ICD-10-CM | POA: Diagnosis not present

## 2021-05-29 DIAGNOSIS — I429 Cardiomyopathy, unspecified: Secondary | ICD-10-CM | POA: Diagnosis not present

## 2021-05-29 DIAGNOSIS — I483 Typical atrial flutter: Secondary | ICD-10-CM | POA: Diagnosis not present

## 2021-05-29 DIAGNOSIS — J9602 Acute respiratory failure with hypercapnia: Secondary | ICD-10-CM | POA: Diagnosis not present

## 2021-05-29 DIAGNOSIS — E875 Hyperkalemia: Secondary | ICD-10-CM | POA: Diagnosis not present

## 2021-05-29 HISTORY — PX: LEFT HEART CATH AND CORONARY ANGIOGRAPHY: CATH118249

## 2021-05-29 LAB — BASIC METABOLIC PANEL
Anion gap: 13 (ref 5–15)
Anion gap: 11 (ref 5–15)
BUN: 32 mg/dL — ABNORMAL HIGH (ref 6–20)
BUN: 35 mg/dL — ABNORMAL HIGH (ref 6–20)
CO2: 36 mmol/L — ABNORMAL HIGH (ref 22–32)
CO2: 31 mmol/L (ref 22–32)
Calcium: 10.1 mg/dL (ref 8.9–10.3)
Calcium: 9.8 mg/dL (ref 8.9–10.3)
Chloride: 91 mmol/L — ABNORMAL LOW (ref 98–111)
Chloride: 97 mmol/L — ABNORMAL LOW (ref 98–111)
Creatinine, Ser: 1.29 mg/dL — ABNORMAL HIGH (ref 0.44–1.00)
Creatinine, Ser: 1.18 mg/dL — ABNORMAL HIGH (ref 0.44–1.00)
GFR, Estimated: 51 mL/min — ABNORMAL LOW (ref >60)
GFR, Estimated: 56 mL/min — ABNORMAL LOW (ref >60)
Glucose, Bld: 141 mg/dL — ABNORMAL HIGH (ref 70–99)
Glucose, Bld: 184 mg/dL — ABNORMAL HIGH (ref 70–99)
Potassium: 2.9 mmol/L — ABNORMAL LOW (ref 3.5–5.1)
Potassium: 2.9 mmol/L — ABNORMAL LOW (ref 3.5–5.1)
Sodium: 140 mmol/L (ref 135–145)
Sodium: 139 mmol/L (ref 135–145)

## 2021-05-29 LAB — CBC
HCT: 43.7 % (ref 36.0–46.0)
Hemoglobin: 14.8 g/dL (ref 12.0–15.0)
MCH: 31.6 pg (ref 26.0–34.0)
MCHC: 33.9 g/dL (ref 30.0–36.0)
MCV: 93.4 fL (ref 80.0–100.0)
Platelets: 331 10*3/uL (ref 150–400)
RBC: 4.68 MIL/uL (ref 3.87–5.11)
RDW: 14.1 % (ref 11.5–15.5)
WBC: 10 10*3/uL (ref 4.0–10.5)
nRBC: 0 % (ref 0.0–0.2)

## 2021-05-29 LAB — BLOOD GAS, ARTERIAL
Acid-Base Excess: 16.2 mmol/L — ABNORMAL HIGH (ref 0.0–2.0)
Bicarbonate: 42.1 mmol/L — ABNORMAL HIGH (ref 20.0–28.0)
O2 Saturation: 98.2 %
Patient temperature: 37
pCO2 arterial: 54 mmHg — ABNORMAL HIGH (ref 32–48)
pH, Arterial: 7.5 — ABNORMAL HIGH (ref 7.35–7.45)
pO2, Arterial: 98 mmHg (ref 83–108)

## 2021-05-29 LAB — GLUCOSE, CAPILLARY
Glucose-Capillary: 164 mg/dL — ABNORMAL HIGH (ref 70–99)
Glucose-Capillary: 164 mg/dL — ABNORMAL HIGH (ref 70–99)
Glucose-Capillary: 181 mg/dL — ABNORMAL HIGH (ref 70–99)
Glucose-Capillary: 195 mg/dL — ABNORMAL HIGH (ref 70–99)
Glucose-Capillary: 195 mg/dL — ABNORMAL HIGH (ref 70–99)
Glucose-Capillary: 202 mg/dL — ABNORMAL HIGH (ref 70–99)

## 2021-05-29 LAB — HEPARIN LEVEL (UNFRACTIONATED): Heparin Unfractionated: 0.46 IU/mL (ref 0.30–0.70)

## 2021-05-29 LAB — COOXEMETRY PANEL
Carboxyhemoglobin: 2.1 % — ABNORMAL HIGH (ref 0.5–1.5)
Methemoglobin: <0.7 % (ref 0.0–1.5)
O2 Saturation: 95.2 %
Total hemoglobin: 15.3 g/dL (ref 12.0–16.0)

## 2021-05-29 LAB — PROTIME-INR
INR: 1.1 (ref 0.8–1.2)
Prothrombin Time: 14.2 seconds (ref 11.4–15.2)

## 2021-05-29 LAB — MAGNESIUM: Magnesium: 2.3 mg/dL (ref 1.7–2.4)

## 2021-05-29 LAB — AMMONIA: Ammonia: 20 umol/L (ref 9–35)

## 2021-05-29 SURGERY — LEFT HEART CATH AND CORONARY ANGIOGRAPHY
Anesthesia: LOCAL

## 2021-05-29 MED ORDER — FENTANYL CITRATE (PF) 100 MCG/2ML IJ SOLN
INTRAMUSCULAR | Status: AC
  Filled 2021-05-29: qty 2

## 2021-05-29 MED ORDER — HEPARIN SODIUM (PORCINE) 1000 UNIT/ML IJ SOLN
INTRAMUSCULAR | Status: AC
  Filled 2021-05-29: qty 10

## 2021-05-29 MED ORDER — HYDRALAZINE HCL 20 MG/ML IJ SOLN: 10.0000 mg | INTRAMUSCULAR | Status: DC | PRN

## 2021-05-29 MED ORDER — ACETAMINOPHEN 325 MG PO TABS
650.0000 mg | ORAL_TABLET | ORAL | Status: DC | PRN
  Administered 2021-05-31 – 2021-06-03 (×2): 650 mg via ORAL
  Filled 2021-05-29 (×2): qty 2

## 2021-05-29 MED ORDER — LIDOCAINE HCL (PF) 1 % IJ SOLN
INTRAMUSCULAR | Status: DC | PRN
  Administered 2021-05-29: 2 mL via INTRADERMAL

## 2021-05-29 MED ORDER — HEPARIN (PORCINE) IN NACL 1000-0.9 UT/500ML-% IV SOLN
INTRAVENOUS | Status: AC
  Filled 2021-05-29: qty 1000

## 2021-05-29 MED ORDER — FENTANYL CITRATE (PF) 100 MCG/2ML IJ SOLN
INTRAMUSCULAR | Status: DC | PRN
Start: 2021-05-29 — End: 2021-05-29
  Administered 2021-05-29: 12.5 ug via INTRAVENOUS

## 2021-05-29 MED ORDER — VERAPAMIL HCL 2.5 MG/ML IV SOLN
INTRAVENOUS | Status: DC | PRN
  Administered 2021-05-29: 10 mL via INTRA_ARTERIAL

## 2021-05-29 MED ORDER — SODIUM CHLORIDE 0.9 % IV SOLN: 250.0000 mL | INTRAVENOUS | Status: DC | PRN

## 2021-05-29 MED ORDER — MIDAZOLAM HCL 2 MG/2ML IJ SOLN
INTRAMUSCULAR | Status: AC
  Filled 2021-05-29: qty 2

## 2021-05-29 MED ORDER — HEPARIN (PORCINE) 25000 UT/250ML-% IV SOLN
1800.0000 [IU]/h | INTRAVENOUS | Status: AC
  Administered 2021-05-29 – 2021-05-30 (×2): 1700 [IU]/h via INTRAVENOUS
  Administered 2021-05-30: 1800 [IU]/h via INTRAVENOUS
  Filled 2021-05-29 (×2): qty 250

## 2021-05-29 MED ORDER — SODIUM CHLORIDE 0.9 % IV SOLN: INTRAVENOUS | Status: DC

## 2021-05-29 MED ORDER — POTASSIUM CHLORIDE CRYS ER 20 MEQ PO TBCR
40.0000 meq | EXTENDED_RELEASE_TABLET | Freq: Once | ORAL | Status: AC
  Administered 2021-05-29: 40 meq via ORAL
  Filled 2021-05-29: qty 2

## 2021-05-29 MED ORDER — HEPARIN SODIUM (PORCINE) 1000 UNIT/ML IJ SOLN
INTRAMUSCULAR | Status: DC | PRN
  Administered 2021-05-29: 2000 [IU] via INTRAVENOUS

## 2021-05-29 MED ORDER — SPIRONOLACTONE 25 MG PO TABS
25.0000 mg | ORAL_TABLET | Freq: Every day | ORAL | Status: DC
  Administered 2021-05-29 – 2021-06-03 (×5): 25 mg via ORAL
  Filled 2021-05-29 (×6): qty 1

## 2021-05-29 MED ORDER — ONDANSETRON HCL 4 MG/2ML IJ SOLN: 4.0000 mg | Freq: Four times a day (QID) | INTRAMUSCULAR | Status: DC | PRN

## 2021-05-29 MED ORDER — POTASSIUM CHLORIDE CRYS ER 20 MEQ PO TBCR
40.0000 meq | EXTENDED_RELEASE_TABLET | Freq: Once | ORAL | Status: AC
Start: 2021-05-29 — End: 2021-05-29
  Administered 2021-05-29: 40 meq via ORAL
  Filled 2021-05-29: qty 2

## 2021-05-29 MED ORDER — LIDOCAINE HCL (PF) 1 % IJ SOLN
INTRAMUSCULAR | Status: AC
  Filled 2021-05-29: qty 30

## 2021-05-29 MED ORDER — LABETALOL HCL 5 MG/ML IV SOLN: 10.0000 mg | INTRAVENOUS | Status: DC | PRN

## 2021-05-29 MED ORDER — IOHEXOL 350 MG/ML SOLN
INTRAVENOUS | Status: DC | PRN
  Administered 2021-05-29: 20 mL via INTRA_ARTERIAL

## 2021-05-29 MED ORDER — MIDAZOLAM HCL 2 MG/2ML IJ SOLN
INTRAMUSCULAR | Status: DC | PRN
  Administered 2021-05-29: .5 mg via INTRAVENOUS

## 2021-05-29 MED ORDER — VERAPAMIL HCL 2.5 MG/ML IV SOLN
INTRAVENOUS | Status: AC
  Filled 2021-05-29: qty 2

## 2021-05-29 MED ORDER — SODIUM CHLORIDE 0.9% FLUSH: 3.0000 mL | INTRAVENOUS | Status: DC | PRN

## 2021-05-29 MED ORDER — HEPARIN (PORCINE) IN NACL 1000-0.9 UT/500ML-% IV SOLN
INTRAVENOUS | Status: DC | PRN
  Administered 2021-05-29 (×2): 500 mL

## 2021-05-29 MED ORDER — SODIUM CHLORIDE 0.9% FLUSH
3.0000 mL | Freq: Two times a day (BID) | INTRAVENOUS | Status: DC
  Administered 2021-05-29 – 2021-06-01 (×3): 3 mL via INTRAVENOUS

## 2021-05-29 SURGICAL SUPPLY — 14 items
BAND CMPR LRG ZPHR (HEMOSTASIS) ×1
BAND ZEPHYR COMPRESS 30 LONG (HEMOSTASIS) ×1 IMPLANT
CATH 5FR JL3.5 JR4 ANG PIG MP (CATHETERS) ×1 IMPLANT
CATH SWAN GANZ 7F STRAIGHT (CATHETERS) IMPLANT
GLIDESHEATH SLEND SS 6F .021 (SHEATH) ×1 IMPLANT
GUIDEWIRE INQWIRE 1.5J.035X260 (WIRE) IMPLANT
INQWIRE 1.5J .035X260CM (WIRE) ×2
KIT HEART LEFT (KITS) ×3 IMPLANT
MAT PREVALON FULL STRYKER (MISCELLANEOUS) ×1 IMPLANT
PACK CARDIAC CATHETERIZATION (CUSTOM PROCEDURE TRAY) ×3 IMPLANT
SHEATH PINNACLE 7F 10CM (SHEATH) IMPLANT
SYR MEDRAD MARK 7 150ML (SYRINGE) ×3 IMPLANT
TRANSDUCER W/STOPCOCK (MISCELLANEOUS) ×3 IMPLANT
TUBING CIL FLEX 10 FLL-RA (TUBING) ×3 IMPLANT

## 2021-05-29 NOTE — TOC Benefit Eligibility Note (Signed)
Patient Advocate Encounter  Prior Authorization for Entresto 24-26MG  tablets has been approved.    PA# [id number] Effective dates: 05/29/2021 through 05/29/2022  Patients co-pay is $4.00.     Roland Earl, CPhT Pharmacy Patient Advocate Specialist [facility] Patient Advocate Team Direct Number: [phone]  Fax: [phone]

## 2021-05-29 NOTE — TOC Benefit Eligibility Note (Signed)
Patient Advocate Encounter   Received notification that prior authorization for Entresto 24-26MG  tablets is required.   PA submitted on 05/29/2021 Key [id number] Status is pending       Lyndel Safe, [place] Patient Advocate Specialist [place] Patient Advocate Team Direct Number: [phone]  Fax: [phone]

## 2021-05-29 NOTE — Progress Notes (Signed)
  Inpatient Rehabilitation Admissions Coordinator   I met at bedside with patient for rehab assessment. No family present, nursing at bedside. I await further medical workup and patient progress to determine most appropriate rehab venue. I will follow.  Danne Baxter, RN, MSN Rehab Admissions Coordinator [phone] 05/29/2021 11:54 AM

## 2021-05-29 NOTE — Progress Notes (Signed)
Mobility Specialist Progress Note  Pt inappropriate for mobility specialist at this time given level of complexity, physical assist, and/or precautions as advised by RN at the time. Mobility specialist to hold today, will continue to follow for readiness.  Frederico Hamman Mobility Specialist Phone Number [phone]

## 2021-05-29 NOTE — Progress Notes (Addendum)
Patient ID: [name], female   DOB: 1970-10-14, 51 y.o.   MRN: [id number]     Advanced Heart Failure Rounding Note  PCP-Cardiologist:  Dr. Aundra Dubin   Subjective:    Remains on lasix gtt @ 8/hr. Brisk diuresis, 5.2L in UOP yesterday. Today's wt not accurate, charted 241>>199 lb. Will ask RN to recheck   Scr ok, 1.19>>1.29 CO2 36  K low 2.9 Mg ok 2.3   Remains in atrial flutter with rate 120s on amiodarone gtt + heparin gtt.   Confusion overnight    Objective:   Weight Range: 90.5 kg Body mass index is 35.34 kg/m.   Vital Signs:   Temp:  [97.6 F (36.4 C)-99.6 F (37.6 C)] 97.8 F (36.6 C) (05/18 0425) Pulse Rate:  [60-133] 60 (05/18 0425) Resp:  [12-20] 20 (05/18 0425) BP: (96-133)/(52-76) 110/75 (05/18 0425) SpO2:  [92 %-98 %] 98 % (05/18 0425) Weight:  [90.5 kg] 90.5 kg (05/18 0425) Last BM Date : 05/28/21  Weight change: Filed Weights   05/26/21 0500 05/27/21 0334 05/29/21 0425  Weight: 109.8 kg 109.5 kg 90.5 kg    Intake/Output:   Intake/Output Summary (Last 24 hours) at 05/29/2021 0748 Last data filed at 05/29/2021 0425 Gross per 24 hour  Intake 1388.67 ml  Output 5215 ml  Net -3826.33 ml      Physical Exam    General:  Well appearing. No respiratory difficulty HEENT: normal Neck: supple. JVP 10 cm Carotids 2+ bilat; no bruits. No lymphadenopathy or thyromegaly appreciated. Cor: PMI nondisplaced. Tachy rhythm. No rubs, gallops or murmurs. Lungs: clear Abdomen: soft, nontender, nondistended. No hepatosplenomegaly. No bruits or masses. Good bowel sounds. Extremities: no cyanosis, clubbing, rash, edema Neuro: alert & oriented x 3, cranial nerves grossly intact. moves all 4 extremities w/o difficulty. Affect pleasant.   Telemetry   A flutter 120s personally reviewed  EKG   N/A  Labs    CBC Recent Labs    05/28/21 0248 05/28/21 0428 05/28/21 0439 05/29/21 0356  WBC 9.4  --   --  10.0  HGB 14.3   < > 14.6 14.8  HCT 43.1   < > 43.0  43.7  MCV 95.1  --   --  93.4  PLT 274  --   --  331   < > = values in this interval not displayed.   Basic Metabolic Panel Recent Labs    05/28/21 0248 05/28/21 0428 05/28/21 1119 05/29/21 0356  NA 138   < > 139 140  K 4.8   < > 3.2* 2.9*  CL 89*  --  86* 91*  CO2 35*  --  39* 36*  GLUCOSE 157*  --  153* 141*  BUN 25*  --  26* 32*  CREATININE 0.99  --  1.19* 1.29*  CALCIUM 9.8  --  10.3 10.1  MG 2.1  --   --  2.3   < > = values in this interval not displayed.   Liver Function Tests Recent Labs    05/27/21 0306  AST 23  ALT 17  ALKPHOS 149*  BILITOT 0.7  PROT 6.6  ALBUMIN 2.8*   No results for input(s): LIPASE, AMYLASE in the last 72 hours. Cardiac Enzymes No results for input(s): CKTOTAL, CKMB, CKMBINDEX, TROPONINI in the last 72 hours.  BNP: BNP (last 3 results) Recent Labs    02/14/21 0742 02/14/21 2153 05/17/21 0415  BNP 309.0* 162.0* 782.6*    ProBNP (last 3 results) No results for input(s):  PROBNP in the last 8760 hours.   D-Dimer No results for input(s): DDIMER in the last 72 hours. Hemoglobin A1C No results for input(s): HGBA1C in the last 72 hours. Fasting Lipid Panel No results for input(s): CHOL, HDL, LDLCALC, TRIG, CHOLHDL, LDLDIRECT in the last 72 hours.  Thyroid Function Tests No results for input(s): TSH, T4TOTAL, T3FREE, THYROIDAB in the last 72 hours.  Invalid input(s): FREET3  Other results:   Imaging    DG Abd Portable 1V  Result Date: 05/28/2021 CLINICAL DATA:  Feeding tube placement EXAM: PORTABLE ABDOMEN - 1 VIEW COMPARISON:  05/19/2021 FINDINGS: Tip of feeding tube is seen in the region of distal antrum of the stomach. Bowel gas pattern is nonspecific. Pelvis is not included in the image. There is improvement in aeration of lower lung fields, possibly suggesting decrease in pleural effusions and infiltrates. There is residual increased density in the left lower lung fields partly obscuring the left hemidiaphragm. Surgical  clips are seen in the right side of abdomen. There is surgical fusion in the lower lumbar spine. IMPRESSION: Tip of enteric tube is seen in the antrum of the stomach. Electronically Signed   By: Elmer Picker M.D.   On: 05/28/2021 14:28     Medications:     Scheduled Medications:  arformoterol  15 mcg Nebulization BID   aspirin  81 mg Oral Daily   atorvastatin  20 mg Oral Daily   budesonide (PULMICORT) nebulizer solution  0.25 mg Nebulization BID   Chlorhexidine Gluconate Cloth  6 each Topical Q0600   digoxin  0.125 mg Oral Daily   feeding supplement  237 mL Oral TID BM   feeding supplement (PROSource TF)  45 mL Per Tube BID   insulin aspart  0-9 Units Subcutaneous Q4H   mouth rinse  15 mL Mouth Rinse BID   pantoprazole  40 mg Oral Q1200   potassium chloride  40 mEq Oral BID   revefenacin  175 mcg Nebulization Daily   senna-docusate  1 tablet Oral BID   sodium chloride flush  10-40 mL Intracatheter Q12H   sodium chloride flush  3 mL Intravenous Q12H   spironolactone  12.5 mg Oral Daily    Infusions:  sodium chloride Stopped (05/29/21 0507)   sodium chloride     sodium chloride 10 mL/hr at 05/29/21 0508   amiodarone 30 mg/hr (05/29/21 0425)   feeding supplement (OSMOLITE 1.5 CAL) Stopped (05/29/21 0000)   furosemide (LASIX) 200 mg in dextrose 5% 100 mL (2mg /mL) infusion 8 mg/hr (05/28/21 1600)   heparin 1,700 Units/hr (05/29/21 0423)    PRN Medications: sodium chloride, acetaminophen, ipratropium-albuterol, levalbuterol, sodium chloride flush, sodium chloride flush    Patient Profile  [name] is a 51 year old with a h/o chronic pain, anxiety, depression, and substance abuse.     Admitted with respiratory failure + A flutter and new systolic heart failure.   Echo EF 20-25%, RV severely reduced   Assessment/Plan   1. Acute hypoxemic respiratory failure: Suspect aspiration in setting of drug abuse (benzos/meth on UDS).  Has PNA + pulmonary edema. Sputum with  Strep pneumo and S aureus.  Now extubated, on 4L [place].  Diuresing well.  - RHC today to assess filling pressures to guide further diuresis.  - Off abx.  2. Acute systolic CHF: No prior history of CHF.  Echo this admission showed LV EF < 20% with moderate LV dilation, severely decreased RV systolic function, moderate TR, normal IVC.  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 Also consider stress cardiomyopathy from sepsis/PNA.  Cannot rule out CAD. Off pressors. Wt down w/ diuresis. Creatinine mildly higher at 1.29.  - Plan R/LHC today to assess filling pressures to guide further diuresis, check cardiac output and r/o CAD. - Give dose of acetazolamide 250 mg x 1 today with HCO3 36. - digoxin 0.125 mg daily.  - Increase spironolactone to 25 mg daily.  - After cath, will need TEE-guided DCCV (see below).  - Add other GDMT as BP tolerates.  3. Atrial flutter: She appears to be in typical AFL.  AFL has been present since at least 2/23.  Concerned for component of tachy-mediated CMP.   - Continue amiodarone gtt.  - Heparin gtt.  - TEE-guided DCCV, probably Friday once fully diuresed.   - Would ideally have ablation down the road.  4. Substance abuse: UDS with methamphetamines.  5. Hypokalemia: K 2.9, Mg ok at 2.3  - aggressive K repletion  - increase spiro to 25 mg daily  6. Confusion  - CO2 36, give acetazolamide 250 mg x 1  - w/ RV failure, will check NH3 level   Lyda Jester, PA-C  05/29/2021 7:48 AM  Patient seen with PA, agree with the above note.   Excellent diuresis again yesterday, creatinine mildly higher at 1.29.  She is on Lasix 8 mg/hr.   Cath today showed normal coronaries.  By PICC, RA pressure 12 mmHg with CI 4.5.  LVEDP 9 mmHg.   She remains in atrial flutter on amiodarone gtt + heparin gtt.   General: NAD Neck: No JVD, no thyromegaly or thyroid nodule.  Lungs: Clear to auscultation bilaterally with normal  respiratory effort. CV: Nondisplaced PMI.  Heart tachy, regular S1/S2, no S3/S4, no murmur.  Trace ankle edema.   Abdomen: Soft, nontender, no hepatosplenomegaly, no distention.  Skin: Intact without lesions or rashes.  Neurologic: Alert and oriented x 3.  Psych: Normal affect. Extremities: No clubbing or cyanosis.  HEENT: Normal.   Mildly elevated CVP today with good diuresis yesterday, creatinine mildly higher. Good cardiac output.  - Would decrease Lasix gtt to 4 mg/hr.   - Acetazolamide dose today.  - Replace K aggressively, BMET in pm.   Restart heparin gtt post-cath, continue amiodarone gtt.  - Plan for TEE-guided DCCV tomorrow. Will discuss with her daughter.    Loralie Champagne 05/29/2021 8:41 AM

## 2021-05-29 NOTE — Progress Notes (Signed)
ANTICOAGULATION CONSULT NOTE  Pharmacy Consult:  Heparin Indication: atrial fibrillation  Allergies  Allergen Reactions   Penicillins Anaphylaxis    Has patient had a PCN reaction causing immediate rash, facial/tongue/throat swelling, SOB or lightheadedness with hypotension: No Has patient had a PCN reaction causing severe rash involving mucus membranes or skin necrosis: No Has patient had a PCN reaction that required hospitalization No Has patient had a PCN reaction occurring within the last 10 years: No If all of the above answers are "NO", then may proceed with Cephalosporin use.    Ibuprofen Nausea And Vomiting    GI upset, Shakes   Peppermint Flavor [Flavoring Agent]     Allergic to pepper the spice   Aspirin Nausea And Vomiting    Gi upset    Patient Measurements: Height: 5\' 3"  (160 cm) Weight: 90.5 kg (199 lb 8.3 oz) IBW/kg (Calculated) : 52.4 Heparin Dosing Weight: 74 kg  Vital Signs: Temp: 97.8 F (36.6 C) (05/18 0425) Temp Source: Oral (05/18 0425) BP: 110/75 (05/18 0425) Pulse Rate: 60 (05/18 0425)  Labs: Recent Labs    05/27/21 0306 05/27/21 0458 05/28/21 0248 05/28/21 0428 05/28/21 0439 05/28/21 0537 05/28/21 1119 05/29/21 0356  HGB 13.9   < > 14.3 14.3 14.6  --   --  14.8  HCT 42.9   < > 43.1 42.0 43.0  --   --  43.7  PLT 228  --  274  --   --   --   --  331  HEPARINUNFRC 0.36  --   --   --   --  0.35  --  0.46  CREATININE 0.94   < > 0.99  --   --   --  1.19* 1.29*   < > = values in this interval not displayed.     Estimated Creatinine Clearance: 55.7 mL/min (A) (by C-G formula based on SCr of 1.29 mg/dL (H)).   Assessment: 51 YOF initially started on IV heparin for rule out PE, then switched to heparin SQ for VTE prophylaxis.  Now with persistent Afib (CHADSVASC 3), so Pharmacy consulted to dose IV heparin.  Heparin level therapeutic; CBC stable. Plans noted for cath today and TEE/DCCV on 5/19   Goal of Therapy:  Heparin level 0.3-0.7  units/ml Monitor platelets by anticoagulation protocol: Yes   Plan:  Continue heparin gtt at 1700 units/hr Will follow plans post cath  6/19, PharmD Clinical Pharmacist **Pharmacist phone directory can now be found on [email or web address] (PW [email or web address]).  Listed under [facility].

## 2021-05-29 NOTE — TOC Benefit Eligibility Note (Signed)
Patient Product/process development scientist completed.    The patient is currently admitted and upon discharge could be taking Eliquis 5 mg.  The current 30 day co-pay is, $4.00.   The patient is currently admitted and upon discharge could be taking Xarelto 20 mg.  The current 30 day co-pay is, $4.00.   The patient is currently admitted and upon discharge could be taking Entresto 24-26 mg.  Requires Prior Authorization  The patient is currently admitted and upon discharge could be taking Farxiga 10 mg.  Requires Prior Authorization  The patient is currently admitted and upon discharge could be taking Jardiance 10 mg.  Requires Prior Authorization  The patient is insured through [facility] [place] Medicaid     Roland Earl, CPhT Pharmacy Patient Advocate Specialist [facility] Patient Advocate Team Direct Number: [phone]  Fax: [phone]

## 2021-05-29 NOTE — Progress Notes (Signed)
Vitals stable in Aflutter with HR 100-130s. A&Ox4 for most of shift with intermittent confusion especially coming out of sleep. A couple episodes slurring words and speaking words not making sense. At other times alert and communicating well but focuses randomly on different things in the room and begins asking questions about them while staff tries to attend to more important matters with the patient. Consent for heart cath and possible TEE/DCCV obtained from phone consent with daughter. Soft hand mitts present on patient upon assessment. Noted patient had previously pulled cortrak nasal tube. Patient complaints of mitts on. At one point was able to remove both mitts and PIV with heparin running was found out the patients arm. Patient stated she didn't remove it. Removed mitts a couple times to give patient breaks while staff remained in room for safety. Was occasionally fidgety with lines, but repeatedly promised that she would behave and not pull at any lines. NPO at midnight (tube feed stopped) for anticipated heart cath today. Will pass along to discuss keeping or removing foley with dayteam.

## 2021-05-29 NOTE — Progress Notes (Signed)
ANTICOAGULATION CONSULT NOTE  Pharmacy Consult:  Heparin Indication: atrial fibrillation  Allergies  Allergen Reactions   Penicillins Anaphylaxis    Has patient had a PCN reaction causing immediate rash, facial/tongue/throat swelling, SOB or lightheadedness with hypotension: No Has patient had a PCN reaction causing severe rash involving mucus membranes or skin necrosis: No Has patient had a PCN reaction that required hospitalization No Has patient had a PCN reaction occurring within the last 10 years: No If all of the above answers are "NO", then may proceed with Cephalosporin use.    Ibuprofen Nausea And Vomiting    GI upset, Shakes   Peppermint Flavor [Flavoring Agent]     Allergic to pepper the spice   Aspirin Nausea And Vomiting    Gi upset    Patient Measurements: Height: 5\' 3"  (160 cm) Weight: 90.5 kg (199 lb 8.3 oz) IBW/kg (Calculated) : 52.4 Heparin Dosing Weight: 74 kg  Vital Signs: Temp: 98.4 F (36.9 C) (05/18 0822) Temp Source: Oral (05/18 0822) BP: 118/58 (05/18 0822) Pulse Rate: 60 (05/18 0425)  Labs: Recent Labs    05/27/21 0306 05/27/21 0458 05/28/21 0248 05/28/21 0428 05/28/21 0439 05/28/21 0537 05/28/21 1119 05/29/21 0356  HGB 13.9   < > 14.3 14.3 14.6  --   --  14.8  HCT 42.9   < > 43.1 42.0 43.0  --   --  43.7  PLT 228  --  274  --   --   --   --  331  HEPARINUNFRC 0.36  --   --   --   --  0.35  --  0.46  CREATININE 0.94   < > 0.99  --   --   --  1.19* 1.29*   < > = values in this interval not displayed.     Estimated Creatinine Clearance: 55.7 mL/min (A) (by C-G formula based on SCr of 1.29 mg/dL (H)).   Assessment: 51 YOF initially started on IV heparin for rule out PE, then switched to heparin SQ for VTE prophylaxis.  Now with persistent Afib (CHADSVASC 3), so pharmacy consulted to dose IV heparin.  Heparin level therapeutic this am; CBC stable. Patient now s/p cath, planning TEE/DCCV on 5/19.    Goal of Therapy:  Heparin level  0.3-0.7 units/ml Monitor platelets by anticoagulation protocol: Yes   Plan:  Restart heparin gtt at 1700 units/hr this afternoon  6/19 PharmD., BCPS Clinical Pharmacist 05/29/2021 9:16 AM

## 2021-05-29 NOTE — TOC Benefit Eligibility Note (Signed)
Patient Advocate Encounter  Prior Authorization for Farxiga 10 mg has been approved.    PA# [id number] Effective dates: 05/29/2021 through 05/29/2022  Patients co-pay is $4.00.     Lyndel Safe, [employer] Patient Advocate Specialist [place] Patient Advocate Team Direct Number: [phone]  Fax: [phone]

## 2021-05-29 NOTE — Progress Notes (Signed)
PROGRESS NOTE    [name]  [id number] DOB: 1970/09/17 DOA: 05/16/2021 PCP: Lemmie Evens, MD   Brief Narrative:  51 year old female with pertinent PMH of anxiety, depression, chronic pain syndrome on multiple psych meds presents to [facility] on 5/5 with acute respiratory failure.  Patient recently admitted 02/14/2021 with similar episode of respiratory failure with sepsis and aspiration pneumonia and patient was intubated.  Patient left AMA on 02/21/2021.  Patient at home takes multiple psych meds: Xanax, amitriptyline, Lyrica, Trintellix, Ambien, citalopram.   5/5 admitted to [facility] intubated for resp failure; transferring to [facility]  5/7 notified of positive blood cultures for night, antibiotic regiment remains seen with broad coverage.  She is more alert and oriented this a.m. 5/8 severe LV and RV dysfunction, LVEF 20%.  Persistent hypoxia and hypercarbia.  5/9 weaned off paralytics and milnirone  5/10 weaned off of epoprostenol.  Vanco stopped.  5/11 weaned off of levophed.  Levaquin  5/12 poor synchrony vent requiring paralytics; Aflutter noted; PICC line placed  5/13 improved ventilator synchrony.  Now 5 L net negative.  Improving oxygenation. 5/15: echo with LVEF 20-25% and RV mildly reduced 5/16: extubated to bipap 5/18: Transition to [facility] - Cardiac cath; planned TEE/DCCV 19th  Assessment & Plan:   Active Problems:   Respiratory failure with hypoxia and hypercapnia (HCC)   Septic shock (HCC)   Acute encephalopathy   ARDS (adult respiratory distress syndrome) (HCC)   HFrEF (heart failure with reduced ejection fraction) (HCC)   Cor pulmonale, acute (HCC)    Shock, POA, resolved Septic vs cardiogenic - Off pressors, see below for specific treatment  Cor pulmonale, Acute (HCC) HFrEF (heart failure with reduced ejection fraction) EF 20% - new with severe RV dysfunction ? sepsis induced myocardial dysfunction Cardiology following -cath planned today   Respiratory failure  with hypoxia and hypercapnia (HCC) ARDS Concurrent aspiration pneunomia vs edema -Extubated previously per PCCM -Wean oxygen as tolerated -Completed antibiotics -Continue supportive care -Bipap HS/PRN   AFib/Flutter  -Continues to be poorly rate controlled (120-130 today at rest) -cont amio and heparin gtt -TEE/DCCV planned 5/19 am   Acute Metabolic Encephalopathy, resolving Required NMB. Now lightening sedation.  Limit CNS depressants   Hypokalemia  -Follow repeat labs  DVT prophylaxis: Heparin drip Code Status: DNR Family Communication: None present  Status is: Inpt  Dispo: The patient is from: Home              Anticipated d/c is to: TBD              Anticipated d/c date is: 48-72h              Patient currently NOT medically stable for discharge  Consultants:  PCCM, Cardiology  Subjective: No acute issues/events overnight  Objective: Vitals:   05/28/21 1958 05/28/21 2106 05/29/21 0054 05/29/21 0425  BP:  97/63 133/64 110/75  Pulse:  (!) 114 (!) 104 60  Resp:  20 19 20   Temp:  97.6 F (36.4 C) 97.6 F (36.4 C) 97.8 F (36.6 C)  TempSrc:  Oral Oral Oral  SpO2: 98% 93% 93% 98%  Weight:    90.5 kg  Height:        Intake/Output Summary (Last 24 hours) at 05/29/2021 0722 Last data filed at 05/29/2021 0425 Gross per 24 hour  Intake 1388.67 ml  Output 5215 ml  Net -3826.33 ml   Filed Weights   05/26/21 0500 05/27/21 0334 05/29/21 0425  Weight: 109.8 kg 109.5 kg 90.5 kg  Examination:  General exam: Appears calm and comfortable  Respiratory system: Clear to auscultation. Respiratory effort normal. Cardiovascular system: Irregularly irregular. No JVD, murmurs, rubs, gallops or clicks. No pedal edema. Gastrointestinal system: Abdomen is nondistended, soft and nontender. No organomegaly or masses felt. Normal bowel sounds heard. Central nervous system: Alert and oriented. No focal neurological deficits. Extremities: Symmetric 5 x 5 power. Skin: No  rashes, lesions or ulcers   Data Reviewed: I have personally reviewed following labs and imaging studies  CBC: Recent Labs  Lab 05/25/21 0319 05/25/21 1052 05/26/21 0315 05/26/21 0359 05/27/21 0306 05/27/21 0458 05/28/21 0248 05/28/21 0428 05/28/21 0439 05/29/21 0356  WBC 4.5  --  5.8  --  5.9  --  9.4  --   --  10.0  HGB 13.3   < > 14.3   < > 13.9 14.3 14.3 14.3 14.6 14.8  HCT 42.2   < > 43.6   < > 42.9 42.0 43.1 42.0 43.0 43.7  MCV 99.3  --  97.3  --  97.5  --  95.1  --   --  93.4  PLT 194  --  198  --  228  --  274  --   --  331   < > = values in this interval not displayed.   Basic Metabolic Panel: Recent Labs  Lab 05/23/21 2009 05/24/21 [id number] 05/25/21 0319 05/25/21 1052 05/26/21 0315 05/26/21 0359 05/27/21 0306 05/27/21 0458 05/27/21 2000 05/28/21 0248 05/28/21 0428 05/28/21 0439 05/28/21 1119 05/29/21 0356  NA 138   < > 139   < > 139   < > 138   < > 138 138 137 138 139 140  K 3.1*   < > 3.4*   < > 3.4*   < > 3.7   < > 3.4* 4.8 3.4* 3.4* 3.2* 2.9*  CL 93*   < > 92*  --  95*  --  93*  --  89* 89*  --   --  86* 91*  CO2 36*   < > 37*  --  35*  --  35*  --  35* 35*  --   --  39* 36*  GLUCOSE 128*   < > 147*  --  140*  --  173*  --  167* 157*  --   --  153* 141*  BUN 17   < > 21*  --  22*  --  26*  --  24* 25*  --   --  26* 32*  CREATININE 0.97   < > 1.07*  --  0.95  --  0.94  --  0.95 0.99  --   --  1.19* 1.29*  CALCIUM 8.5*   < > 9.0  --  9.4  --  9.4  --  10.2 9.8  --   --  10.3 10.1  MG 1.7  --  2.1  --  2.2  --   --   --   --  2.1  --   --   --  2.3   < > = values in this interval not displayed.   GFR: Estimated Creatinine Clearance: 55.7 mL/min (A) (by C-G formula based on SCr of 1.29 mg/dL (H)). Liver Function Tests: Recent Labs  Lab 05/23/21 0442 05/24/21 0453 05/27/21 0306  AST 20 19 23   ALT 16 16 17   ALKPHOS 119 124 149*  BILITOT 0.9 0.6 0.7  PROT 6.0* 5.8* 6.6  ALBUMIN 2.3* 2.3* 2.8*   No  results for input(s): LIPASE, AMYLASE in the last  168 hours. No results for input(s): AMMONIA in the last 168 hours. Coagulation Profile: No results for input(s): INR, PROTIME in the last 168 hours. Cardiac Enzymes: No results for input(s): CKTOTAL, CKMB, CKMBINDEX, TROPONINI in the last 168 hours. BNP (last 3 results) No results for input(s): PROBNP in the last 8760 hours. HbA1C: No results for input(s): HGBA1C in the last 72 hours. CBG: Recent Labs  Lab 05/28/21 1524 05/28/21 1646 05/28/21 2117 05/29/21 0023 05/29/21 0422  GLUCAP 131* 159* 208* 181* 164*   Lipid Profile: No results for input(s): CHOL, HDL, LDLCALC, TRIG, CHOLHDL, LDLDIRECT in the last 72 hours. Thyroid Function Tests: No results for input(s): TSH, T4TOTAL, FREET4, T3FREE, THYROIDAB in the last 72 hours. Anemia Panel: No results for input(s): VITAMINB12, FOLATE, FERRITIN, TIBC, IRON, RETICCTPCT in the last 72 hours. Sepsis Labs: No results for input(s): PROCALCITON, LATICACIDVEN in the last 168 hours.  No results found for this or any previous visit (from the past 240 hour(s)).       Radiology Studies: DG Abd Portable 1V  Result Date: 05/28/2021 CLINICAL DATA:  Feeding tube placement EXAM: PORTABLE ABDOMEN - 1 VIEW COMPARISON:  05/19/2021 FINDINGS: Tip of feeding tube is seen in the region of distal antrum of the stomach. Bowel gas pattern is nonspecific. Pelvis is not included in the image. There is improvement in aeration of lower lung fields, possibly suggesting decrease in pleural effusions and infiltrates. There is residual increased density in the left lower lung fields partly obscuring the left hemidiaphragm. Surgical clips are seen in the right side of abdomen. There is surgical fusion in the lower lumbar spine. IMPRESSION: Tip of enteric tube is seen in the antrum of the stomach. Electronically Signed   By: Elmer Picker M.D.   On: 05/28/2021 14:28        Scheduled Meds:  arformoterol  15 mcg Nebulization BID   aspirin  81 mg Oral Daily    atorvastatin  20 mg Oral Daily   budesonide (PULMICORT) nebulizer solution  0.25 mg Nebulization BID   Chlorhexidine Gluconate Cloth  6 each Topical Q0600   digoxin  0.125 mg Oral Daily   feeding supplement  237 mL Oral TID BM   feeding supplement (PROSource TF)  45 mL Per Tube BID   insulin aspart  0-9 Units Subcutaneous Q4H   mouth rinse  15 mL Mouth Rinse BID   pantoprazole  40 mg Oral Q1200   potassium chloride  40 mEq Oral BID   revefenacin  175 mcg Nebulization Daily   senna-docusate  1 tablet Oral BID   sodium chloride flush  10-40 mL Intracatheter Q12H   sodium chloride flush  3 mL Intravenous Q12H   spironolactone  12.5 mg Oral Daily   Continuous Infusions:  sodium chloride Stopped (05/29/21 0507)   sodium chloride     sodium chloride 10 mL/hr at 05/29/21 0508   amiodarone 30 mg/hr (05/29/21 0425)   feeding supplement (OSMOLITE 1.5 CAL) Stopped (05/29/21 0000)   furosemide (LASIX) 200 mg in dextrose 5% 100 mL (2mg /mL) infusion 8 mg/hr (05/28/21 1600)   heparin 1,700 Units/hr (05/29/21 0423)     LOS: 13 days   Time spent: 66min  Sofiah Lyne C Kaoru Rezendes, DO [facility]  If 7PM-7AM, please contact night-coverage [email or web address]  05/29/2021, 7:22 AM

## 2021-05-29 NOTE — TOC Benefit Eligibility Note (Signed)
Patient Advocate Encounter   Received notification that prior authorization for Farxiga 10MG  tablets is required.   PA submitted on 05/29/2021 Key [id number] Status is pending       Lyndel Safe, [place] Patient Advocate Specialist [place] Patient Advocate Team Direct Number: [phone]  Fax: [phone]

## 2021-05-29 NOTE — Interval H&P Note (Signed)
History and Physical Interval Note:  05/29/2021 8:04 AM  [name]  has presented today for surgery, with the diagnosis of acute systolic heart failure.  The various methods of treatment have been discussed with the patient and family. After consideration of risks, benefits and other options for treatment, the patient has consented to  Procedure(s): CORONARY ANGIOGRAPHY (N/A) as a surgical intervention.  The patient's history has been reviewed, patient examined, no change in status, stable for surgery.  I have reviewed the patient's chart and labs.  Questions were answered to the patient's satisfaction.     [name] [employer]

## 2021-05-30 ENCOUNTER — Inpatient Hospital Stay (HOSPITAL_COMMUNITY): Payer: Medicaid Other | Admitting: Anesthesiology

## 2021-05-30 ENCOUNTER — Inpatient Hospital Stay (HOSPITAL_COMMUNITY): Payer: Medicaid Other

## 2021-05-30 ENCOUNTER — Encounter (HOSPITAL_COMMUNITY)
Admission: EM | Disposition: A | Discharge status: Home Health | Payer: Self-pay | Source: Home / Self Care | Attending: Pulmonary Disease

## 2021-05-30 ENCOUNTER — Encounter (HOSPITAL_COMMUNITY): Payer: Self-pay | Admitting: Internal Medicine

## 2021-05-30 DIAGNOSIS — I4892 Unspecified atrial flutter: Secondary | ICD-10-CM | POA: Diagnosis not present

## 2021-05-30 DIAGNOSIS — I340 Nonrheumatic mitral (valve) insufficiency: Secondary | ICD-10-CM | POA: Diagnosis not present

## 2021-05-30 DIAGNOSIS — F418 Other specified anxiety disorders: Secondary | ICD-10-CM | POA: Diagnosis not present

## 2021-05-30 DIAGNOSIS — Z66 Do not resuscitate: Secondary | ICD-10-CM | POA: Diagnosis not present

## 2021-05-30 DIAGNOSIS — G928 Other toxic encephalopathy: Secondary | ICD-10-CM | POA: Diagnosis not present

## 2021-05-30 DIAGNOSIS — I083 Combined rheumatic disorders of mitral, aortic and tricuspid valves: Secondary | ICD-10-CM

## 2021-05-30 DIAGNOSIS — K7200 Acute and subacute hepatic failure without coma: Secondary | ICD-10-CM | POA: Diagnosis not present

## 2021-05-30 DIAGNOSIS — I10 Essential (primary) hypertension: Secondary | ICD-10-CM

## 2021-05-30 DIAGNOSIS — I4891 Unspecified atrial fibrillation: Secondary | ICD-10-CM

## 2021-05-30 DIAGNOSIS — J80 Acute respiratory distress syndrome: Secondary | ICD-10-CM | POA: Diagnosis not present

## 2021-05-30 DIAGNOSIS — I739 Peripheral vascular disease, unspecified: Secondary | ICD-10-CM | POA: Diagnosis not present

## 2021-05-30 DIAGNOSIS — I483 Typical atrial flutter: Secondary | ICD-10-CM | POA: Diagnosis not present

## 2021-05-30 DIAGNOSIS — R570 Cardiogenic shock: Secondary | ICD-10-CM | POA: Diagnosis not present

## 2021-05-30 DIAGNOSIS — J15211 Pneumonia due to Methicillin susceptible Staphylococcus aureus: Secondary | ICD-10-CM | POA: Diagnosis not present

## 2021-05-30 DIAGNOSIS — R6521 Severe sepsis with septic shock: Secondary | ICD-10-CM | POA: Diagnosis not present

## 2021-05-30 DIAGNOSIS — J154 Pneumonia due to other streptococci: Secondary | ICD-10-CM | POA: Diagnosis not present

## 2021-05-30 DIAGNOSIS — J9601 Acute respiratory failure with hypoxia: Secondary | ICD-10-CM | POA: Diagnosis not present

## 2021-05-30 DIAGNOSIS — Z1152 Encounter for screening for COVID-19: Secondary | ICD-10-CM | POA: Diagnosis not present

## 2021-05-30 DIAGNOSIS — G9340 Encephalopathy, unspecified: Secondary | ICD-10-CM | POA: Diagnosis not present

## 2021-05-30 DIAGNOSIS — I5043 Acute on chronic combined systolic (congestive) and diastolic (congestive) heart failure: Secondary | ICD-10-CM | POA: Diagnosis not present

## 2021-05-30 DIAGNOSIS — J690 Pneumonitis due to inhalation of food and vomit: Secondary | ICD-10-CM | POA: Diagnosis not present

## 2021-05-30 DIAGNOSIS — A403 Sepsis due to Streptococcus pneumoniae: Secondary | ICD-10-CM | POA: Diagnosis not present

## 2021-05-30 DIAGNOSIS — J9602 Acute respiratory failure with hypercapnia: Secondary | ICD-10-CM | POA: Diagnosis not present

## 2021-05-30 HISTORY — PX: CARDIOVERSION: SHX1299

## 2021-05-30 HISTORY — PX: TEE WITHOUT CARDIOVERSION: SHX5443

## 2021-05-30 LAB — BLOOD GAS, ARTERIAL
Acid-Base Excess: 8.5 mmol/L — ABNORMAL HIGH (ref 0.0–2.0)
Bicarbonate: 31.8 mmol/L — ABNORMAL HIGH (ref 20.0–28.0)
Drawn by: 65022
O2 Saturation: 95.3 %
Patient temperature: 37.3
pCO2 arterial: 39 mmHg (ref 32–48)
pH, Arterial: 7.52 — ABNORMAL HIGH (ref 7.35–7.45)
pO2, Arterial: 75 mmHg — ABNORMAL LOW (ref 83–108)

## 2021-05-30 LAB — POCT I-STAT EG7
Acid-Base Excess: 14 mmol/L — ABNORMAL HIGH (ref 0.0–2.0)
Bicarbonate: 39.6 mmol/L — ABNORMAL HIGH (ref 20.0–28.0)
Calcium, Ion: 1.24 mmol/L (ref 1.15–1.40)
HCT: 45 % (ref 36.0–46.0)
Hemoglobin: 15.3 g/dL — ABNORMAL HIGH (ref 12.0–15.0)
O2 Saturation: 85 %
Potassium: 3.1 mmol/L — ABNORMAL LOW (ref 3.5–5.1)
Sodium: 138 mmol/L (ref 135–145)
TCO2: 41 mmol/L — ABNORMAL HIGH (ref 22–32)
pCO2, Ven: 52.9 mmHg (ref 44–60)
pH, Ven: 7.482 — ABNORMAL HIGH (ref 7.25–7.43)
pO2, Ven: 48 mmHg — ABNORMAL HIGH (ref 32–45)

## 2021-05-30 LAB — CBC
HCT: 46.1 % — ABNORMAL HIGH (ref 36.0–46.0)
Hemoglobin: 15.6 g/dL — ABNORMAL HIGH (ref 12.0–15.0)
MCH: 31.6 pg (ref 26.0–34.0)
MCHC: 33.8 g/dL (ref 30.0–36.0)
MCV: 93.5 fL (ref 80.0–100.0)
Platelets: 389 10*3/uL (ref 150–400)
RBC: 4.93 MIL/uL (ref 3.87–5.11)
RDW: 14.1 % (ref 11.5–15.5)
WBC: 11.1 10*3/uL — ABNORMAL HIGH (ref 4.0–10.5)
nRBC: 0 % (ref 0.0–0.2)

## 2021-05-30 LAB — BASIC METABOLIC PANEL
Anion gap: 12 (ref 5–15)
BUN: 35 mg/dL — ABNORMAL HIGH (ref 6–20)
CO2: 31 mmol/L (ref 22–32)
Calcium: 10.1 mg/dL (ref 8.9–10.3)
Chloride: 99 mmol/L (ref 98–111)
Creatinine, Ser: 1.24 mg/dL — ABNORMAL HIGH (ref 0.44–1.00)
GFR, Estimated: 53 mL/min — ABNORMAL LOW (ref >60)
Glucose, Bld: 185 mg/dL — ABNORMAL HIGH (ref 70–99)
Potassium: 3.3 mmol/L — ABNORMAL LOW (ref 3.5–5.1)
Sodium: 142 mmol/L (ref 135–145)

## 2021-05-30 LAB — GLUCOSE, CAPILLARY
Glucose-Capillary: 145 mg/dL — ABNORMAL HIGH (ref 70–99)
Glucose-Capillary: 192 mg/dL — ABNORMAL HIGH (ref 70–99)
Glucose-Capillary: 194 mg/dL — ABNORMAL HIGH (ref 70–99)
Glucose-Capillary: 195 mg/dL — ABNORMAL HIGH (ref 70–99)
Glucose-Capillary: 222 mg/dL — ABNORMAL HIGH (ref 70–99)
Glucose-Capillary: 231 mg/dL — ABNORMAL HIGH (ref 70–99)
Glucose-Capillary: 235 mg/dL — ABNORMAL HIGH (ref 70–99)

## 2021-05-30 LAB — MAGNESIUM: Magnesium: 2.3 mg/dL (ref 1.7–2.4)

## 2021-05-30 LAB — COOXEMETRY PANEL
Carboxyhemoglobin: 3.3 % — ABNORMAL HIGH (ref 0.5–1.5)
Methemoglobin: <0.7 % (ref 0.0–1.5)
O2 Saturation: 78.2 %
Total hemoglobin: 16 g/dL (ref 12.0–16.0)

## 2021-05-30 LAB — HEPARIN LEVEL (UNFRACTIONATED): Heparin Unfractionated: 0.28 IU/mL — ABNORMAL LOW (ref 0.30–0.70)

## 2021-05-30 SURGERY — ECHOCARDIOGRAM, TRANSESOPHAGEAL
Anesthesia: Monitor Anesthesia Care

## 2021-05-30 MED ORDER — SACUBITRIL-VALSARTAN 24-26 MG PO TABS
1.0000 | ORAL_TABLET | Freq: Two times a day (BID) | ORAL | Status: DC
  Administered 2021-05-30 – 2021-06-01 (×5): 1 via ORAL
  Filled 2021-05-30 (×5): qty 1

## 2021-05-30 MED ORDER — PROPOFOL 500 MG/50ML IV EMUL
INTRAVENOUS | Status: DC | PRN
Start: 2021-05-30 — End: 2021-05-30
  Administered 2021-05-30: 150 ug/kg/min via INTRAVENOUS

## 2021-05-30 MED ORDER — SODIUM CHLORIDE 0.9 % IV SOLN: INTRAVENOUS | Status: DC

## 2021-05-30 MED ORDER — TORSEMIDE 20 MG PO TABS
20.0000 mg | ORAL_TABLET | Freq: Every day | ORAL | Status: DC
  Administered 2021-05-30 – 2021-05-31 (×2): 20 mg via ORAL
  Filled 2021-05-30 (×3): qty 1

## 2021-05-30 MED ORDER — LACTATED RINGERS IV SOLN: INTRAVENOUS | Status: DC | PRN

## 2021-05-30 MED ORDER — POTASSIUM CHLORIDE CRYS ER 20 MEQ PO TBCR
40.0000 meq | EXTENDED_RELEASE_TABLET | Freq: Once | ORAL | Status: AC
Start: 2021-05-30 — End: 2021-05-30
  Administered 2021-05-30: 40 meq via ORAL
  Filled 2021-05-30: qty 2

## 2021-05-30 NOTE — Anesthesia Preprocedure Evaluation (Addendum)
Anesthesia Evaluation  Patient identified by MRN, date of birth, ID band Patient awake    Reviewed: Allergy & Precautions, NPO status , Patient's Chart, lab work & pertinent test results  Airway Mallampati: II  TM Distance: >3 FB     Dental   Pulmonary Current Smoker and Patient abstained from smoking.,    breath sounds clear to auscultation       Cardiovascular hypertension, + Peripheral Vascular Disease   Rhythm:Regular Rate:Normal     Neuro/Psych PSYCHIATRIC DISORDERS Anxiety Depression    GI/Hepatic Neg liver ROS, History noted Dr. Nyoka Cowden   Endo/Other  negative endocrine ROS  Renal/GU negative Renal ROS     Musculoskeletal   Abdominal   Peds  Hematology   Anesthesia Other Findings   Reproductive/Obstetrics                             Anesthesia Physical Anesthesia Plan  ASA: 3  Anesthesia Plan: MAC   Post-op Pain Management:    Induction: Intravenous  PONV Risk Score and Plan: Treatment may vary due to age or medical condition and Propofol infusion  Airway Management Planned: Nasal Cannula and Simple Face Mask  Additional Equipment:   Intra-op Plan:   Post-operative Plan:   Informed Consent: I have reviewed the patients History and Physical, chart, labs and discussed the procedure including the risks, benefits and alternatives for the proposed anesthesia with the patient or authorized representative who has indicated his/her understanding and acceptance.     Dental advisory given  Plan Discussed with: Anesthesiologist and CRNA  Anesthesia Plan Comments:         Anesthesia Quick Evaluation

## 2021-05-30 NOTE — Progress Notes (Signed)
PROGRESS NOTE    [name]  [id number] DOB: 08-09-1970 DOA: 05/16/2021 PCP: Lemmie Evens, MD   Brief Narrative:  51 year old female with pertinent PMH of anxiety, depression, chronic pain syndrome on multiple psych meds presents to [facility] on 5/5 with acute respiratory failure.  Patient recently admitted 02/14/2021 with similar episode of respiratory failure with sepsis and aspiration pneumonia and patient was intubated.  Patient left AMA on 02/21/2021.  Patient at home takes multiple psych meds: Xanax, amitriptyline, Lyrica, Trintellix, Ambien, citalopram.   5/5 admitted to [facility] intubated for resp failure; transferring to [facility]  5/7 notified of positive blood cultures for night, antibiotic regiment remains seen with broad coverage.  She is more alert and oriented this a.m. 5/8 severe LV and RV dysfunction, LVEF 20%.  Persistent hypoxia and hypercarbia.  5/9 weaned off paralytics and milnirone  5/10 weaned off of epoprostenol.  Vanco stopped.  5/11 weaned off of levophed.  Levaquin  5/12 poor synchrony vent requiring paralytics; Aflutter noted; PICC line placed  5/13 improved ventilator synchrony.  Now 5 L net negative.  Improving oxygenation. 5/15: echo with LVEF 20-25% and RV mildly reduced 5/16: extubated to bipap 5/18: Transition to [facility] - Cardiac cath 5/19: TEE/DCCV 19th - NG incidentally removed - SLP to follow - if necessary will replace NG  Assessment & Plan:   Active Problems:   Respiratory failure with hypoxia and hypercapnia (HCC)   Septic shock (HCC)   Acute encephalopathy   ARDS (adult respiratory distress syndrome) (HCC)   HFrEF (heart failure with reduced ejection fraction) (HCC)   Cor pulmonale, acute (HCC)    Shock, POA, resolved Septic vs cardiogenic - Off pressors, see below for further treatment details  Cor pulmonale, Acute (HCC) HFrEF (heart failure with reduced ejection fraction) EF 20% - new with severe RV dysfunction Questionably sepsis induced  myocardial dysfunction dCardiology following -cath planned today   Respiratory failure with hypoxia and hypercapnia (HCC) ARDS Concurrent aspiration pneunomia vs edema -Extubated previously per PCCM -Wean oxygen as tolerated -Completed antibiotics -Continue supportive care -Bipap HS/PRN   AFib/Flutter  -Continues to be poorly rate controlled (120-130 today at rest) -cont amio and heparin gtt -TEE/DCCV planned 5/19 am   Acute Metabolic Encephalopathy, resolving Required NMB. Now lightening sedation.  Limit CNS depressants  sssssssssssssssssssssssssssssssssssssssssssssssssssssssssssssssssssssssssssssssssssssssssssssssssssssssssssssssssssssssssssssssssssssssssssssssHypokalemia  -Follow repeat labs  DVT prophylaxis: Heparin drip Code Status: DNR Family Communication: None present  Status is: Inpt  Dispo: The patient is from: Home              Anticipated d/c is to: TBD              Anticipated d/c date is: 48-72h              Patient currently NOT medically stable for discharge  Consultants:  PCCM, Cardiology  Subjective: No acute issues/events overnight  Objective: Vitals:   05/30/21 1055 05/30/21 1112 05/30/21 1121 05/30/21 1256  BP: 127/75 132/76 124/83   Pulse: (!) 114  (!) 115 (!) 120  Resp: (!) 32 (!) 23 (!) 29   Temp:  99.3 F (37.4 C) 99.3 F (37.4 C)   TempSrc:  Oral Oral   SpO2: 94% 94% 92% 92%  Weight:      Height:        Intake/Output Summary (Last 24 hours) at 05/30/2021 1400 Last data filed at 05/30/2021 1122 Gross per 24 hour  Intake 324.91 ml  Output 1600 ml  Net -1275.09 ml    [employer]  05/29/21 0425 05/30/21 0344 05/30/21 0907  Weight: 90.5 kg 90.9 kg 90.9 kg    Examination:  General exam: Appears calm and comfortable  Respiratory system: Clear to auscultation. Respiratory effort normal. Cardiovascular system: Irregularly irregular. No JVD, murmurs, rubs, gallops or clicks. No pedal edema. Gastrointestinal system: Abdomen is  nondistended, soft and nontender. No organomegaly or masses felt. Normal bowel sounds heard. Central nervous system: Alert and oriented. No focal neurological deficits. Extremities: Symmetric 5 x 5 power. Skin: No rashes, lesions or ulcers   Data Reviewed: I have personally reviewed following labs and imaging studies  CBC: Recent Labs  Lab 05/26/21 0315 05/26/21 0359 05/27/21 0306 05/27/21 0458 05/28/21 0248 05/28/21 0428 05/28/21 0439 05/29/21 0356 05/30/21 0428  WBC 5.8  --  5.9  --  9.4  --   --  10.0 11.1*  HGB 14.3   < > 13.9   < > 14.3 14.3 14.6 14.8 15.6*  HCT 43.6   < > 42.9   < > 43.1 42.0 43.0 43.7 46.1*  MCV 97.3  --  97.5  --  95.1  --   --  93.4 93.5  PLT 198  --  228  --  274  --   --  331 389   < > = values in this interval not displayed.    Basic Metabolic Panel: Recent Labs  Lab 05/25/21 0319 05/25/21 1052 05/26/21 0315 05/26/21 0359 05/28/21 0248 05/28/21 0428 05/28/21 0439 05/28/21 1119 05/29/21 0356 05/29/21 1614 05/30/21 0428  NA 139   < > 139   < > 138   < > 138 139 140 139 142  K 3.4*   < > 3.4*   < > 4.8   < > 3.4* 3.2* 2.9* 2.9* 3.3*  CL 92*  --  95*   < > 89*  --   --  86* 91* 97* 99  CO2 37*  --  35*   < > 35*  --   --  39* 36* 31 31  GLUCOSE 147*  --  140*   < > 157*  --   --  153* 141* 184* 185*  BUN 21*  --  22*   < > 25*  --   --  26* 32* 35* 35*  CREATININE 1.07*  --  0.95   < > 0.99  --   --  1.19* 1.29* 1.18* 1.24*  CALCIUM 9.0  --  9.4   < > 9.8  --   --  10.3 10.1 9.8 10.1  MG 2.1  --  2.2  --  2.1  --   --   --  2.3  --  2.3   < > = values in this interval not displayed.    GFR: Estimated Creatinine Clearance: 58.1 mL/min (A) (by C-G formula based on SCr of 1.24 mg/dL (H)). Liver Function Tests: Recent Labs  Lab 05/24/21 0453 05/27/21 0306  AST 19 23  ALT 16 17  ALKPHOS 124 149*  BILITOT 0.6 0.7  PROT 5.8* 6.6  ALBUMIN 2.3* 2.8*    No results for input(s): LIPASE, AMYLASE in the last 168 hours. Recent Labs   Lab 05/29/21 1127  AMMONIA 20   Coagulation Profile: Recent Labs  Lab 05/29/21 1917  INR 1.1   Cardiac Enzymes: No results for input(s): CKTOTAL, CKMB, CKMBINDEX, TROPONINI in the last 168 hours. BNP (last 3 results) No results for input(s): PROBNP in the last 8760 hours. HbA1C: No results for input(s): HGBA1C  in the last 72 hours. CBG: Recent Labs  Lab 05/29/21 2001 05/30/21 0000 05/30/21 0413 05/30/21 0746 05/30/21 1120  GLUCAP 195* 231* 195* 192* 194*    Lipid Profile: No results for input(s): CHOL, HDL, LDLCALC, TRIG, CHOLHDL, LDLDIRECT in the last 72 hours. Thyroid Function Tests: No results for input(s): TSH, T4TOTAL, FREET4, T3FREE, THYROIDAB in the last 72 hours. Anemia Panel: No results for input(s): VITAMINB12, FOLATE, FERRITIN, TIBC, IRON, RETICCTPCT in the last 72 hours. Sepsis Labs: No results for input(s): PROCALCITON, LATICACIDVEN in the last 168 hours.  No results found for this or any previous visit (from the past 240 hour(s)).       Radiology Studies: CARDIAC CATHETERIZATION  Result Date: 05/29/2021 1. Normal LVEDP with RA pressure elevated to 12 mmHg (measured by PICC). 2. Elevated cardiac output (from PICC). 3. Angiographically normal coronaries. 20 cc contrast used.   DG Abd Portable 1V  Result Date: 05/28/2021 CLINICAL DATA:  Feeding tube placement EXAM: PORTABLE ABDOMEN - 1 VIEW COMPARISON:  05/19/2021 FINDINGS: Tip of feeding tube is seen in the region of distal antrum of the stomach. Bowel gas pattern is nonspecific. Pelvis is not included in the image. There is improvement in aeration of lower lung fields, possibly suggesting decrease in pleural effusions and infiltrates. There is residual increased density in the left lower lung fields partly obscuring the left hemidiaphragm. Surgical clips are seen in the right side of abdomen. There is surgical fusion in the lower lumbar spine. IMPRESSION: Tip of enteric tube is seen in the antrum of the  stomach. Electronically Signed   By: Elmer Picker M.D.   On: 05/28/2021 14:28        Scheduled Meds:  arformoterol  15 mcg Nebulization BID   atorvastatin  20 mg Oral Daily   budesonide (PULMICORT) nebulizer solution  0.25 mg Nebulization BID   Chlorhexidine Gluconate Cloth  6 each Topical Q0600   digoxin  0.125 mg Oral Daily   feeding supplement  237 mL Oral TID BM   feeding supplement (PROSource TF)  45 mL Per Tube BID   insulin aspart  0-9 Units Subcutaneous Q4H   mouth rinse  15 mL Mouth Rinse BID   pantoprazole  40 mg Oral Q1200   potassium chloride  40 mEq Oral BID   revefenacin  175 mcg Nebulization Daily   sacubitril-valsartan  1 tablet Oral BID   senna-docusate  1 tablet Oral BID   sodium chloride flush  10-40 mL Intracatheter Q12H   sodium chloride flush  3 mL Intravenous Q12H   sodium chloride flush  3 mL Intravenous Q12H   spironolactone  25 mg Oral Daily   torsemide  20 mg Oral Daily   Continuous Infusions:  sodium chloride     amiodarone 60 mg/hr (05/30/21 1041)   feeding supplement (OSMOLITE 1.5 CAL) Stopped (05/29/21 0000)   heparin 1,800 Units/hr (05/30/21 1202)     LOS: 14 days   Time spent: 96min  Ardel Jagger C Rylen Swindler, DO [facility]  If 7PM-7AM, please contact night-coverage [email or web address]  05/30/2021, 2:00 PM

## 2021-05-30 NOTE — Progress Notes (Signed)
Patient ID: [name], female   DOB: 14-Feb-1970, 51 y.o.   MRN: [id number]     Advanced Heart Failure Rounding Note  PCP-Cardiologist:  Dr. Aundra Dubin   Subjective:    Remains on lasix gtt @ 4/hr. I/Os negative. CVP 9-10, co-ox 78%.   Scr ok, 1.19>>1.29>>1.2  Remains in atrial flutter with rate 120s on amiodarone gtt + heparin gtt.   LHC showed no significant coronary disease.   Awake/alert this morning, mild confusion.    Objective:   Weight Range: 90.9 kg Body mass index is 35.5 kg/m.   Vital Signs:   Temp:  [97.6 F (36.4 C)-99.2 F (37.3 C)] 99.2 F (37.3 C) (05/19 0344) Resp:  [18-23] 20 (05/19 0344) BP: (111-132)/(58-83) 132/83 (05/19 0344) SpO2:  [92 %-100 %] 94 % (05/19 0344) Weight:  [90.9 kg] 90.9 kg (05/19 0344) Last BM Date : 05/29/21  Weight change: Filed Weights   05/27/21 0334 05/29/21 0425 05/30/21 0344  Weight: 109.5 kg 90.5 kg 90.9 kg    Intake/Output:   Intake/Output Summary (Last 24 hours) at 05/30/2021 0725 Last data filed at 05/30/2021 0547 Gross per 24 hour  Intake 348.29 ml  Output 2800 ml  Net -2451.71 ml      Physical Exam    General: NAD Neck: JVP 8 cm, no thyromegaly or thyroid nodule.  Lungs: Clear to auscultation bilaterally with normal respiratory effort. CV: Nondisplaced PMI.  Heart tachy, irregular S1/S2, no S3/S4, no murmur.  No peripheral edema.   Abdomen: Soft, nontender, no hepatosplenomegaly, no distention.  Skin: Intact without lesions or rashes.  Neurologic: Alert and oriented x 3.  Psych: Normal affect. Extremities: No clubbing or cyanosis.  HEENT: Normal.   Telemetry   A flutter 120s personally reviewed  EKG   N/A  Labs    CBC Recent Labs    05/29/21 0356 05/30/21 0428  WBC 10.0 11.1*  HGB 14.8 15.6*  HCT 43.7 46.1*  MCV 93.4 93.5  PLT 331 [id number]   Basic Metabolic Panel Recent Labs    05/29/21 0356 05/29/21 1614 05/30/21 0428  NA 140 139 142  K 2.9* 2.9* 3.3*  CL 91* 97* 99  CO2 36* 31  31  GLUCOSE 141* 184* 185*  BUN 32* 35* 35*  CREATININE 1.29* 1.18* 1.24*  CALCIUM 10.1 9.8 10.1  MG 2.3  --  2.3   Liver Function Tests No results for input(s): AST, ALT, ALKPHOS, BILITOT, PROT, ALBUMIN in the last 72 hours.  No results for input(s): LIPASE, AMYLASE in the last 72 hours. Cardiac Enzymes No results for input(s): CKTOTAL, CKMB, CKMBINDEX, TROPONINI in the last 72 hours.  BNP: BNP (last 3 results) Recent Labs    02/14/21 0742 02/14/21 2153 05/17/21 0415  BNP 309.0* 162.0* 782.6*    ProBNP (last 3 results) No results for input(s): PROBNP in the last 8760 hours.   D-Dimer No results for input(s): DDIMER in the last 72 hours. Hemoglobin A1C No results for input(s): HGBA1C in the last 72 hours. Fasting Lipid Panel No results for input(s): CHOL, HDL, LDLCALC, TRIG, CHOLHDL, LDLDIRECT in the last 72 hours.  Thyroid Function Tests No results for input(s): TSH, T4TOTAL, T3FREE, THYROIDAB in the last 72 hours.  Invalid input(s): FREET3  Other results:   Imaging    CARDIAC CATHETERIZATION  Result Date: 05/29/2021 1. Normal LVEDP with RA pressure elevated to 12 mmHg (measured by PICC). 2. Elevated cardiac output (from PICC). 3. Angiographically normal coronaries. 20 cc contrast used.  Medications:     Scheduled Medications:  arformoterol  15 mcg Nebulization BID   atorvastatin  20 mg Oral Daily   budesonide (PULMICORT) nebulizer solution  0.25 mg Nebulization BID   Chlorhexidine Gluconate Cloth  6 each Topical Q0600   digoxin  0.125 mg Oral Daily   feeding supplement  237 mL Oral TID BM   feeding supplement (PROSource TF)  45 mL Per Tube BID   insulin aspart  0-9 Units Subcutaneous Q4H   mouth rinse  15 mL Mouth Rinse BID   pantoprazole  40 mg Oral Q1200   potassium chloride  40 mEq Oral BID   potassium chloride  40 mEq Oral Once   revefenacin  175 mcg Nebulization Daily   senna-docusate  1 tablet Oral BID   sodium chloride flush  10-40 mL  Intracatheter Q12H   sodium chloride flush  3 mL Intravenous Q12H   sodium chloride flush  3 mL Intravenous Q12H   spironolactone  25 mg Oral Daily   torsemide  20 mg Oral Daily    Infusions:  sodium chloride     sodium chloride     amiodarone 60 mg/hr (05/30/21 0349)   feeding supplement (OSMOLITE 1.5 CAL) Stopped (05/29/21 0000)   heparin 1,700 Units/hr (05/29/21 1712)    PRN Medications: sodium chloride, acetaminophen, ipratropium-albuterol, levalbuterol, ondansetron (ZOFRAN) IV, sodium chloride flush, sodium chloride flush    Patient Profile  [name] is a 51 year old with a h/o chronic pain, anxiety, depression, and substance abuse.     Admitted with respiratory failure + A flutter and new systolic heart failure.   Echo EF 20-25%, RV severely reduced   Assessment/Plan   1. Acute hypoxemic respiratory failure: Suspect aspiration in setting of drug abuse (benzos/meth on UDS).  Has PNA + pulmonary edema. Sputum with Strep pneumo and S aureus.  Now extubated and off oxygen.  She has diuresed well.  - Off abx.  2. Acute systolic CHF: No prior history of CHF.  Echo this admission showed LV EF < 20% with moderate LV dilation, severely decreased RV systolic function, moderate TR, normal IVC.  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 Also consider stress cardiomyopathy from sepsis/PNA.  No significant CAD on 5/18 cath. Off pressors. Wt down w/ diuresis. Creatinine stable at 1.2.  Co-ox 78% with CVP 9-10.  - Stop IV Lasix today, start torsemide 20 mg daily and replace K.  - digoxin 0.125 mg daily.  - Continue spironolactone 25 mg daily.  - Can start on Entresto 24/26 bid.  - TEE-guided DCCV (see below).  3. Atrial flutter: She appears to be in typical AFL.  AFL has been present since at least 2/23.  Concerned for component of tachy-mediated CMP.   - Continue amiodarone gtt.  - Heparin gtt, eventually transition to Eliquis.  -  TEE-guided DCCV today.  Have discussed with daughter and patient, consent obtained.    - Would ideally have ablation down the road.  4. Substance abuse: UDS with methamphetamines.  5. Hypokalemia: Replace.  6. Confusion: Seems to be improving.   Loralie Champagne, 05/30/2021 7:25 AM

## 2021-05-30 NOTE — Progress Notes (Signed)
PT refuse cpap 

## 2021-05-30 NOTE — Progress Notes (Signed)
  Echocardiogram Echocardiogram Transesophageal has been performed.  [name] 05/30/2021, 10:50 AM

## 2021-05-30 NOTE — Progress Notes (Signed)
Physical Therapy Treatment Patient Details Name: [name] MRN: [id number] DOB: [birth year] Today's Date: 05/30/2021   History of Present Illness 51yo female adm 5/5  with sepsis, aspiration PNA and respiratory failure. Intubated 5/5 - 5/16. +amphetamines/benzos. Pt with acute biventricular heart failure and aflutter. Cardiac cath 5/18. TEE/cardioversion 5/19.  PMH - Leiden Factor V, HTN, chronic laryngitis, anxiety, chronic pain, back surgery, neck surgery    PT Comments    Pt pleasant with perseveration on family dynamics throughout session. Pt able to progress to sitting, standing and limited gait this session. HR 120-151 with activity with sustained 120-140 with activity and back to 112 at rest. Pt with sats 92% on RA. Educated for HEP, transfers and progression with D/C plan still appropriate and boyfriend present during session.     Recommendations for follow up therapy are one component of a multi-disciplinary discharge planning process, led by the attending physician.  Recommendations may be updated based on patient status, additional functional criteria and insurance authorization.  Follow Up Recommendations  Acute inpatient rehab (3hours/day)     Assistance Recommended at Discharge Frequent or constant Supervision/Assistance  Patient can return home with the following Assistance with cooking/housework;Direct supervision/assist for medications management;Direct supervision/assist for financial management;Assist for transportation;Help with stairs or ramp for entrance;A lot of help with bathing/dressing/bathroom;A little help with walking and/or transfers   Equipment Recommendations  Rolling walker (2 wheels)    Recommendations for Other Services       Precautions / Restrictions Precautions Precautions: Fall;Other (comment) Precaution Comments: watch HR     Mobility  Bed Mobility Overal bed mobility: Needs Assistance Bed Mobility: Rolling, Sidelying to Sit Rolling:  Min assist Sidelying to sit: Min assist       General bed mobility comments: min assist to roll bil for pericare with cues for precautions and not using RUE. Rise from right side to sitting with min assist and multimodal cues for sequence    Transfers Overall transfer level: Needs assistance Equipment used: Rolling walker (2 wheels) Transfers: Sit to/from Stand Sit to Stand: Min assist           General transfer comment: cues for hand placement and safety following precautions. Pt with lack of control with sitting and plopping to surface    Ambulation/Gait Ambulation/Gait assistance: Min assist Gait Distance (Feet): 100 Feet Assistive device: Rolling walker (2 wheels) Gait Pattern/deviations: Step-through pattern, Decreased stride length, Trunk flexed   Gait velocity interpretation: <1.8 ft/sec, indicate of risk for recurrent falls   General Gait Details: mod cues, pt running into objects x 5 despite cues. pt with assist to direct and control RW as well as for balance. 3 standing rest breaks with activity with HR 120-151   Stairs             Wheelchair Mobility    Modified Rankin (Stroke Patients Only)       Balance Overall balance assessment: Needs assistance Sitting-balance support: Bilateral upper extremity supported, Feet supported Sitting balance-[name] Scale: Fair Sitting balance - Comments: static sitting without UE support   Standing balance support: Bilateral upper extremity supported, During functional activity, Reliant on assistive device for balance Standing balance-[name] Scale: Poor Standing balance comment: RW for gait and static standing                            Cognition Arousal/Alertness: Awake/alert Behavior During Therapy: Flat affect Overall Cognitive Status: Impaired/Different from baseline Area of Impairment: Attention, Following  commands, Safety/judgement, Awareness, Problem solving                   Current  Attention Level: Selective   Following Commands: Follows one step commands consistently Safety/Judgement: Decreased awareness of deficits, Decreased awareness of safety Awareness: Intellectual Problem Solving: Slow processing, Decreased initiation, Difficulty sequencing, Requires verbal cues, Requires tactile cues General Comments: Slow to respond, perseverating on family dynamics of son and wanting to move to [facility] with daughter. Incontinent of bowel on arrival and unaware        Exercises General Exercises - Lower Extremity Long Arc Quad: AROM, Both, Seated, 15 reps Hip Flexion/Marching: AROM, Both, 10 reps, Seated    General Comments        Pertinent Vitals/Pain Pain Assessment Pain Assessment: No/denies pain    Home Living                          Prior Function            PT Goals (current goals can now be found in the care plan section) Progress towards PT goals: Progressing toward goals    Frequency    Min 3X/week      PT Plan Current plan remains appropriate    Co-evaluation              AM-PAC PT "6 Clicks" Mobility   Outcome Measure  Help needed turning from your back to your side while in a flat bed without using bedrails?: A Little Help needed moving from lying on your back to sitting on the side of a flat bed without using bedrails?: A Little Help needed moving to and from a bed to a chair (including a wheelchair)?: A Little Help needed standing up from a chair using your arms (e.g., wheelchair or bedside chair)?: A Little Help needed to walk in hospital room?: A Lot Help needed climbing 3-5 steps with a railing? : Total 6 Click Score: 15    End of Session Equipment Utilized During Treatment: Gait belt Activity Tolerance: Patient tolerated treatment well Patient left: in chair;with call bell/phone within reach;with chair alarm set Nurse Communication: Mobility status PT Visit Diagnosis: Unsteadiness on feet (R26.81);Other  abnormalities of gait and mobility (R26.89);Muscle weakness (generalized) (M62.81)     Time: [id number] PT Time Calculation (min) (ACUTE ONLY): 36 min  Charges:  $Gait Training: 8-22 mins $Therapeutic Activity: 8-22 mins                     Nigeria Lasseter P, PT Acute Rehabilitation Services Pager: [phone] Office: [place] 05/30/2021, 1:04 PM

## 2021-05-30 NOTE — CV Procedure (Signed)
Procedure: TEE  Indication: Atrial flutter  Sedation: Per anesthesiology  Findings: Please see echo section for full report.  Moderately dilated LV with severe systolic dysfunction, EF 20-25%.  Diffuse hypokinesis.  Mildly dilated RV with moderate systolic dysfunction.  Moderate left atrial enlargement, no LA appendage thrombus.  Mild RA enlargement.   Mild mitral regurgitation.  Mild tricuspid regurgitation, peak RV-RA gradient 28 mmHg.  Trileaflet aortic valve with trivial AI, no aortic stenosis.  No PFO/ASD by color doppler.   Normal caliber thoracic aorta with no plaque.   Marca Ancona 05/30/2021 10:32 AM

## 2021-05-30 NOTE — Progress Notes (Signed)
ANTICOAGULATION CONSULT NOTE  Pharmacy Consult:  Heparin Indication: atrial fibrillation  Allergies  Allergen Reactions   Penicillins Anaphylaxis    Has patient had a PCN reaction causing immediate rash, facial/tongue/throat swelling, SOB or lightheadedness with hypotension: No Has patient had a PCN reaction causing severe rash involving mucus membranes or skin necrosis: No Has patient had a PCN reaction that required hospitalization No Has patient had a PCN reaction occurring within the last 10 years: No If all of the above answers are "NO", then may proceed with Cephalosporin use.    Ibuprofen Nausea And Vomiting    GI upset, Shakes   Peppermint Flavor [Flavoring Agent]     Allergic to pepper the spice   Aspirin Nausea And Vomiting    Gi upset    Patient Measurements: Height: 5\' 3"  (160 cm) Weight: 90.9 kg (200 lb 6.4 oz) IBW/kg (Calculated) : 52.4 Heparin Dosing Weight: 74 kg  Vital Signs: Temp: 99.3 F (37.4 C) (05/19 1121) Temp Source: Oral (05/19 1121) BP: 124/83 (05/19 1121) Pulse Rate: 115 (05/19 1121)  Labs: Recent Labs    05/28/21 0248 05/28/21 0428 05/28/21 0439 05/28/21 0537 05/28/21 1119 05/29/21 0356 05/29/21 1614 05/29/21 1917 05/30/21 0428  HGB 14.3   < > 14.6  --   --  14.8  --   --  15.6*  HCT 43.1   < > 43.0  --   --  43.7  --   --  46.1*  PLT 274  --   --   --   --  331  --   --  389  LABPROT  --   --   --   --   --   --   --  14.2  --   INR  --   --   --   --   --   --   --  1.1  --   HEPARINUNFRC  --   --   --  0.35  --  0.46  --   --  0.28*  CREATININE 0.99  --   --   --    < > 1.29* 1.18*  --  1.24*   < > = values in this interval not displayed.     Estimated Creatinine Clearance: 58.1 mL/min (A) (by C-G formula based on SCr of 1.24 mg/dL (H)).   Assessment: 51 YOF initially started on IV heparin for rule out PE, then switched to heparin SQ for VTE prophylaxis.  Now with persistent Afib (CHADSVASC 3), so pharmacy consulted to dose  IV heparin.  Heparin level just below goal this am; CBC stable. Patient now s/p cath, planning TEE/DCCV today. Will adjust heparin rate.   Goal of Therapy:  Heparin level 0.3-0.7 units/ml Monitor platelets by anticoagulation protocol: Yes   Plan:  Increase heparin gtt to 1800 units/hr  Recheck heparin level in am  06/01/21 PharmD., BCPS Clinical Pharmacist 05/30/2021 11:29 AM

## 2021-05-30 NOTE — Evaluation (Signed)
Clinical/Bedside Swallow Evaluation Patient Details  Name: [name] MRN: [id number] Date of Birth: January 30, 1970  Today's Date: 05/30/2021 Time: SLP Start Time (ACUTE ONLY): 1530 SLP Stop Time (ACUTE ONLY): 1545 SLP Time Calculation (min) (ACUTE ONLY): 15 min  Past Medical History:  Past Medical History:  Diagnosis Date   Anxiety    Back pain    Chronic back pain 02/04/13   By patient report   Depression    Factor 5 Leiden mutation, heterozygous (HCC)    Hypertension    Laryngitis, chronic    Panic attack    Scoliosis    Ulcer    Past Surgical History:  Past Surgical History:  Procedure Laterality Date   BACK SURGERY     CESAREAN SECTION     CHOLECYSTECTOMY  11/27/2011   Procedure: LAPAROSCOPIC CHOLECYSTECTOMY;  Surgeon: Fabio Bering, MD;  Location: [facility] ORS;  Service: General;  Laterality: N/A;   CRYOTHERAPY  02/17/2021   Procedure: CRYOTHERAPY;  Surgeon: Lorin Glass, MD;  Location: [facility] ENDOSCOPY;  Service: Pulmonary;;   EMBOLECTOMY Left 12/06/2015   Procedure: EMBOLECTOMY LEFT ARM;  Surgeon: Sherren Kerns, MD;  Location: [facility] OR;  Service: Vascular;  Laterality: Left;   LEFT HEART CATH AND CORONARY ANGIOGRAPHY N/A 05/29/2021   Procedure: LEFT HEART CATH AND CORONARY ANGIOGRAPHY;  Surgeon: Laurey Morale, MD;  Location: [facility] INVASIVE CV LAB;  Service: Cardiovascular;  Laterality: N/A;   NECK SURGERY     TUBAL LIGATION     VIDEO BRONCHOSCOPY Right 02/17/2021   Procedure: VIDEO BRONCHOSCOPY WITHOUT FLUORO;  Surgeon: Lorin Glass, MD;  Location: [facility] ENDOSCOPY;  Service: Pulmonary;  Laterality: Right;  with cryo/ basket   HPI:  51yo female admitted 05/16/21 due to unresponsiveness and hypercapnea and hypoxemic respiratory failure, sepsis, aspiration PNA. PMH: anxiety, psychiatric abnormalities, GERD, HTN, chronic laryngitis. Pt intubated 5/5-16/23. Pt with acute biventricular heart failure and AFlutter.    Assessment / Plan / Recommendation  Clinical Impression  Pt seen  at bedside for swallow evaluation. RN reported difficulty with PO meds. Pt also reported difficulty with medications. Pt is edentulous. CN exam is unremarkable. Pt has chronic laryngitis. She tolerated trials of thin liquid, puree, and solid texture without obvious oral issues or overt s/s aspiration. However, pt is at increased risk for aspiration due to extended intubation. Recommend dys3 (mechanical soft) diet with thin liquids, crushed meds. Safe swallow precautions posted at [facility]. RN and MD informed. SLP will follow for diet tolerance and education.  SLP Visit Diagnosis: Dysphagia, unspecified (R13.10)    Aspiration Risk  Mild aspiration risk    Diet Recommendation Dysphagia 3 (Mech soft);Thin liquid   Liquid Administration via: Straw;Cup Medication Administration: Crushed with puree Supervision: Patient able to self feed;Intermittent supervision to cue for compensatory strategies Compensations: Minimize environmental distractions;Slow rate;Small sips/bites Postural Changes: Seated upright at 90 degrees    Other  Recommendations Oral Care Recommendations: Oral care BID    Recommendations for follow up therapy are one component of a multi-disciplinary discharge planning process, led by the attending physician.  Recommendations may be updated based on patient status, additional functional criteria and insurance authorization.  Follow up Recommendations Other (comment) (TBD)      Assistance Recommended at Discharge Other (comment) (TBD)  Functional Status Assessment Patient has had a recent decline in their functional status and/or demonstrates limited ability to make significant improvements in function in a reasonable and predictable amount of time  Frequency and Duration min 1 x/week  1  week;2 weeks       Prognosis Prognosis for Safe Diet Advancement: Fair Barriers/Prognosis Comment: edentulous      Swallow Study   General Date of Onset: 05/16/21 HPI: 51yo female admitted  05/16/21 due to unresponsiveness and hypercapnea and hypoxemic respiratory failure, sepsis, aspiration PNA. PMH: anxiety, psychiatric abnormalities, GERD, HTN, chronic laryngitis. Pt intubated 5/5-16/23. Pt with acute biventricular heart failure and AFlutter. Type of Study: Bedside Swallow Evaluation Previous Swallow Assessment: none found Diet Prior to this Study: NPO Temperature Spikes Noted: No Respiratory Status: Room air History of Recent Intubation: Yes Length of Intubations (days): 11 days Date extubated: 05/27/21 Behavior/Cognition: Alert;Cooperative;Pleasant mood;Confused Oral Cavity Assessment: Within Functional Limits Oral Care Completed by SLP: No Oral Cavity - Dentition: Edentulous Vision: Functional for self-feeding Self-Feeding Abilities: Able to feed self;Needs assist;Needs set up Patient Positioning: Upright in bed Baseline Vocal Quality: Hoarse Volitional Cough: Weak Volitional Swallow: Able to elicit    Oral/Motor/Sensory Function Overall Oral Motor/Sensory Function: Within functional limits   Ice Chips Ice chips: Not tested   Thin Liquid Thin Liquid: Within functional limits Presentation: Straw    Nectar Thick Nectar Thick Liquid: Not tested   Honey Thick Honey Thick Liquid: Not tested   Puree Puree: Within functional limits Presentation: Spoon   Solid     Solid: Within functional limits     Akiya Morr B. Murvin Natal, [facility], CCC-SLP Speech Language Pathologist Office: [phone]  Leigh Aurora 05/30/2021,3:55 PM

## 2021-05-30 NOTE — Progress Notes (Signed)
PT Cancellation Note  Patient Details Name: [name] MRN: [id number] DOB: 06-15-1970   Cancelled Treatment:    Reason Eval/Treat Not Completed: Patient at procedure or test/unavailable (pt off floor for cardioversion)   [name] [name] 05/30/2021, 9:15 AM [name], PT Acute Rehabilitation Services Pager: [phone] Office: [phone]

## 2021-05-30 NOTE — Anesthesia Postprocedure Evaluation (Signed)
Anesthesia Post Note  Patient: [name]  Procedure(s) Performed: TRANSESOPHAGEAL ECHOCARDIOGRAM (TEE) CARDIOVERSION     Patient location during evaluation: Endoscopy Anesthesia Type: MAC Level of consciousness: awake Pain management: pain level controlled Vital Signs Assessment: post-procedure vital signs reviewed and stable Respiratory status: spontaneous breathing Cardiovascular status: stable Postop Assessment: no apparent nausea or vomiting Anesthetic complications: no   No notable events documented.  Last Vitals:  Vitals:   05/30/21 1112 05/30/21 1121  BP: 132/76 124/83  Pulse:  (!) 115  Resp: (!) 23 (!) 29  Temp: 37.4 C 37.4 C  SpO2: 94% 92%    Last Pain:  Vitals:   05/30/21 1121  TempSrc: Oral  PainSc:                  Izick Gasbarro

## 2021-05-30 NOTE — Transfer of Care (Signed)
Immediate Anesthesia Transfer of Care Note  Patient: [name]  Procedure(s) Performed: TRANSESOPHAGEAL ECHOCARDIOGRAM (TEE) CARDIOVERSION  Patient Location: Endoscopy Unit  Anesthesia Type:MAC  Level of Consciousness: awake  Airway & Oxygen Therapy: Patient Spontanous Breathing and Patient connected to face mask oxygen  Post-op Assessment: Report given to RN  Post vital signs: Reviewed and stable  Last Vitals:  Vitals Value Taken Time  BP 110/54   Temp    Pulse    Resp 24   SpO2 95     Last Pain:  Vitals:   05/30/21 0907  TempSrc: Tympanic  PainSc: 0-No pain         Complications: No notable events documented.

## 2021-05-30 NOTE — Progress Notes (Signed)
OT Cancellation Note  Patient Details Name: [name] MRN: [id number] DOB: 1970-11-05   Cancelled Treatment:    Reason Eval/Treat Not Completed: Patient at procedure or test/ unavailable. Pt off floor for TEE and Cardioversion. OT will follow as schedule allows.   [name] Elane Brion Hedges 05/30/2021, 10:47 AM

## 2021-05-30 NOTE — Interval H&P Note (Signed)
History and Physical Interval Note:  05/30/2021 10:15 AM  [name]  has presented today for surgery, with the diagnosis of aflutter.  The various methods of treatment have been discussed with the patient and family. After consideration of risks, benefits and other options for treatment, the patient has consented to  Procedure(s): TRANSESOPHAGEAL ECHOCARDIOGRAM (TEE) (N/A) CARDIOVERSION (N/A) as a surgical intervention.  The patient's history has been reviewed, patient examined, no change in status, stable for surgery.  I have reviewed the patient's chart and labs.  Questions were answered to the patient's satisfaction.     Karliah Kowalchuk [employer]

## 2021-05-30 NOTE — H&P (View-Only) (Signed)
Patient ID: [name], female   DOB: [birth year], 51 y.o.   MRN: [id number]     Advanced Heart Failure Rounding Note  PCP-Cardiologist:  Dr. Aundra Dubin   Subjective:    Remains on lasix gtt @ 4/hr. I/Os negative. CVP 9-10, co-ox 78%.   Scr ok, 1.19>>1.29>>1.2  Remains in atrial flutter with rate 120s on amiodarone gtt + heparin gtt.   LHC showed no significant coronary disease.   Awake/alert this morning, mild confusion.    Objective:   Weight Range: 90.9 kg Body mass index is 35.5 kg/m.   Vital Signs:   Temp:  [97.6 F (36.4 C)-99.2 F (37.3 C)] 99.2 F (37.3 C) (05/19 0344) Resp:  [18-23] 20 (05/19 0344) BP: (111-132)/(58-83) 132/83 (05/19 0344) SpO2:  [92 %-100 %] 94 % (05/19 0344) Weight:  [90.9 kg] 90.9 kg (05/19 0344) Last BM Date : 05/29/21  Weight change: Filed Weights   05/27/21 0334 05/29/21 0425 05/30/21 0344  Weight: 109.5 kg 90.5 kg 90.9 kg    Intake/Output:   Intake/Output Summary (Last 24 hours) at 05/30/2021 0725 Last data filed at 05/30/2021 0547 Gross per 24 hour  Intake 348.29 ml  Output 2800 ml  Net -2451.71 ml      Physical Exam    General: NAD Neck: JVP 8 cm, no thyromegaly or thyroid nodule.  Lungs: Clear to auscultation bilaterally with normal respiratory effort. CV: Nondisplaced PMI.  Heart tachy, irregular S1/S2, no S3/S4, no murmur.  No peripheral edema.   Abdomen: Soft, nontender, no hepatosplenomegaly, no distention.  Skin: Intact without lesions or rashes.  Neurologic: Alert and oriented x 3.  Psych: Normal affect. Extremities: No clubbing or cyanosis.  HEENT: Normal.   Telemetry   A flutter 120s personally reviewed  EKG   N/A  Labs    CBC Recent Labs    05/29/21 0356 05/30/21 0428  WBC 10.0 11.1*  HGB 14.8 15.6*  HCT 43.7 46.1*  MCV 93.4 93.5  PLT 331 [id number]   Basic Metabolic Panel Recent Labs    05/29/21 0356 05/29/21 1614 05/30/21 0428  NA 140 139 142  K 2.9* 2.9* 3.3*  CL 91* 97* 99  CO2 36* 31  31  GLUCOSE 141* 184* 185*  BUN 32* 35* 35*  CREATININE 1.29* 1.18* 1.24*  CALCIUM 10.1 9.8 10.1  MG 2.3  --  2.3   Liver Function Tests No results for input(s): AST, ALT, ALKPHOS, BILITOT, PROT, ALBUMIN in the last 72 hours.  No results for input(s): LIPASE, AMYLASE in the last 72 hours. Cardiac Enzymes No results for input(s): CKTOTAL, CKMB, CKMBINDEX, TROPONINI in the last 72 hours.  BNP: BNP (last 3 results) Recent Labs    02/14/21 0742 02/14/21 2153 05/17/21 0415  BNP 309.0* 162.0* 782.6*    ProBNP (last 3 results) No results for input(s): PROBNP in the last 8760 hours.   D-Dimer No results for input(s): DDIMER in the last 72 hours. Hemoglobin A1C No results for input(s): HGBA1C in the last 72 hours. Fasting Lipid Panel No results for input(s): CHOL, HDL, LDLCALC, TRIG, CHOLHDL, LDLDIRECT in the last 72 hours.  Thyroid Function Tests No results for input(s): TSH, T4TOTAL, T3FREE, THYROIDAB in the last 72 hours.  Invalid input(s): FREET3  Other results:   Imaging    CARDIAC CATHETERIZATION  Result Date: 05/29/2021 1. Normal LVEDP with RA pressure elevated to 12 mmHg (measured by PICC). 2. Elevated cardiac output (from PICC). 3. Angiographically normal coronaries. 20 cc contrast used.  Medications:     Scheduled Medications:  arformoterol  15 mcg Nebulization BID   atorvastatin  20 mg Oral Daily   budesonide (PULMICORT) nebulizer solution  0.25 mg Nebulization BID   Chlorhexidine Gluconate Cloth  6 each Topical Q0600   digoxin  0.125 mg Oral Daily   feeding supplement  237 mL Oral TID BM   feeding supplement (PROSource TF)  45 mL Per Tube BID   insulin aspart  0-9 Units Subcutaneous Q4H   mouth rinse  15 mL Mouth Rinse BID   pantoprazole  40 mg Oral Q1200   potassium chloride  40 mEq Oral BID   potassium chloride  40 mEq Oral Once   revefenacin  175 mcg Nebulization Daily   senna-docusate  1 tablet Oral BID   sodium chloride flush  10-40 mL  Intracatheter Q12H   sodium chloride flush  3 mL Intravenous Q12H   sodium chloride flush  3 mL Intravenous Q12H   spironolactone  25 mg Oral Daily   torsemide  20 mg Oral Daily    Infusions:  sodium chloride     sodium chloride     amiodarone 60 mg/hr (05/30/21 0349)   feeding supplement (OSMOLITE 1.5 CAL) Stopped (05/29/21 0000)   heparin 1,700 Units/hr (05/29/21 1712)    PRN Medications: sodium chloride, acetaminophen, ipratropium-albuterol, levalbuterol, ondansetron (ZOFRAN) IV, sodium chloride flush, sodium chloride flush    Patient Profile  [name] is a 51 year old with a h/o chronic pain, anxiety, depression, and substance abuse.     Admitted with respiratory failure + A flutter and new systolic heart failure.   Echo EF 20-25%, RV severely reduced   Assessment/Plan   1. Acute hypoxemic respiratory failure: Suspect aspiration in setting of drug abuse (benzos/meth on UDS).  Has PNA + pulmonary edema. Sputum with Strep pneumo and S aureus.  Now extubated and off oxygen.  She has diuresed well.  - Off abx.  2. Acute systolic CHF: No prior history of CHF.  Echo this admission showed LV EF < 20% with moderate LV dilation, severely decreased RV systolic function, moderate TR, normal IVC.  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 Also consider stress cardiomyopathy from sepsis/PNA.  No significant CAD on 5/18 cath. Off pressors. Wt down w/ diuresis. Creatinine stable at 1.2.  Co-ox 78% with CVP 9-10.  - Stop IV Lasix today, start torsemide 20 mg daily and replace K.  - digoxin 0.125 mg daily.  - Continue spironolactone 25 mg daily.  - Can start on Entresto 24/26 bid.  - TEE-guided DCCV (see below).  3. Atrial flutter: She appears to be in typical AFL.  AFL has been present since at least 2/23.  Concerned for component of tachy-mediated CMP.   - Continue amiodarone gtt.  - Heparin gtt, eventually transition to Eliquis.  -  TEE-guided DCCV today.  Have discussed with daughter and patient, consent obtained.    - Would ideally have ablation down the road.  4. Substance abuse: UDS with methamphetamines.  5. Hypokalemia: Replace.  6. Confusion: Seems to be improving.   Loralie Champagne, 05/30/2021 7:25 AM

## 2021-05-30 NOTE — Interval H&P Note (Signed)
History and Physical Interval Note:  05/30/2021 10:15 AM  [name]  has presented today for surgery, with the diagnosis of aflutter.  The various methods of treatment have been discussed with the patient and family. After consideration of risks, benefits and other options for treatment, the patient has consented to  Procedure(s): TRANSESOPHAGEAL ECHOCARDIOGRAM (TEE) (N/A) CARDIOVERSION (N/A) as a surgical intervention.  The patient's history has been reviewed, patient examined, no change in status, stable for surgery.  I have reviewed the patient's chart and labs.  Questions were answered to the patient's satisfaction.     [name]   

## 2021-05-30 NOTE — Progress Notes (Signed)
Inpatient Rehabilitation Admissions Coordinator   I will follow up next week with pt's progress to assist with planning rehab venue options.  Danne Baxter, RN, MSN Rehab Admissions Coordinator [phone] 05/30/2021 3:08 PM

## 2021-05-30 NOTE — Procedures (Signed)
Electrical Cardioversion Procedure Note JANIQUA FRISCIA [id number] [birth year]  Procedure: Electrical Cardioversion Indications:  Atrial Flutter  Procedure Details Consent: Risks of procedure as well as the alternatives and risks of each were explained to the (patient/caregiver).  Consent for procedure obtained. Time Out: Verified patient identification, verified procedure, site/side was marked, verified correct patient position, special equipment/implants available, medications/allergies/relevent history reviewed, required imaging and test results available.  Performed  Patient placed on cardiac monitor, pulse oximetry, supplemental oxygen as necessary.  Sedation given:  Propfol per anesthesiology Pacer pads placed anterior and posterior chest.  Cardioverted 1 time(s).  Cardioverted at 150J.  Evaluation Findings: Post procedure EKG shows: NSR Complications: None Patient did tolerate procedure well.   Marca Ancona 05/30/2021, 10:32 AM

## 2021-05-31 DIAGNOSIS — G9340 Encephalopathy, unspecified: Secondary | ICD-10-CM | POA: Diagnosis not present

## 2021-05-31 DIAGNOSIS — J9602 Acute respiratory failure with hypercapnia: Secondary | ICD-10-CM | POA: Diagnosis not present

## 2021-05-31 DIAGNOSIS — J80 Acute respiratory distress syndrome: Secondary | ICD-10-CM | POA: Diagnosis not present

## 2021-05-31 DIAGNOSIS — I5043 Acute on chronic combined systolic (congestive) and diastolic (congestive) heart failure: Secondary | ICD-10-CM | POA: Diagnosis not present

## 2021-05-31 DIAGNOSIS — I4892 Unspecified atrial flutter: Secondary | ICD-10-CM | POA: Diagnosis not present

## 2021-05-31 LAB — BASIC METABOLIC PANEL
Anion gap: 11 (ref 5–15)
BUN: 29 mg/dL — ABNORMAL HIGH (ref 6–20)
CO2: 26 mmol/L (ref 22–32)
Calcium: 9.2 mg/dL (ref 8.9–10.3)
Chloride: 106 mmol/L (ref 98–111)
Creatinine, Ser: 0.99 mg/dL (ref 0.44–1.00)
GFR, Estimated: >60 mL/min (ref >60)
Glucose, Bld: 202 mg/dL — ABNORMAL HIGH (ref 70–99)
Potassium: 3.2 mmol/L — ABNORMAL LOW (ref 3.5–5.1)
Sodium: 143 mmol/L (ref 135–145)

## 2021-05-31 LAB — CBC
HCT: 48.1 % — ABNORMAL HIGH (ref 36.0–46.0)
Hemoglobin: 15.7 g/dL — ABNORMAL HIGH (ref 12.0–15.0)
MCH: 31.2 pg (ref 26.0–34.0)
MCHC: 32.6 g/dL (ref 30.0–36.0)
MCV: 95.4 fL (ref 80.0–100.0)
Platelets: 454 10*3/uL — ABNORMAL HIGH (ref 150–400)
RBC: 5.04 MIL/uL (ref 3.87–5.11)
RDW: 14.3 % (ref 11.5–15.5)
WBC: 16 10*3/uL — ABNORMAL HIGH (ref 4.0–10.5)
nRBC: 0 % (ref 0.0–0.2)

## 2021-05-31 LAB — COOXEMETRY PANEL
Carboxyhemoglobin: 1.6 % — ABNORMAL HIGH (ref 0.5–1.5)
Methemoglobin: 0.7 % (ref 0.0–1.5)
O2 Saturation: 79 %
Total hemoglobin: 16.2 g/dL — ABNORMAL HIGH (ref 12.0–16.0)

## 2021-05-31 LAB — GLUCOSE, CAPILLARY
Glucose-Capillary: 222 mg/dL — ABNORMAL HIGH (ref 70–99)
Glucose-Capillary: 187 mg/dL — ABNORMAL HIGH (ref 70–99)
Glucose-Capillary: 180 mg/dL — ABNORMAL HIGH (ref 70–99)
Glucose-Capillary: 182 mg/dL — ABNORMAL HIGH (ref 70–99)
Glucose-Capillary: 177 mg/dL — ABNORMAL HIGH (ref 70–99)

## 2021-05-31 LAB — LIPOPROTEIN A (LPA): Lipoprotein (a): 17.9 nmol/L (ref <75.0)

## 2021-05-31 LAB — HEPARIN LEVEL (UNFRACTIONATED)
Heparin Unfractionated: >1.1 IU/mL — ABNORMAL HIGH (ref 0.30–0.70)
Heparin Unfractionated: 0.61 IU/mL (ref 0.30–0.70)
Heparin Unfractionated: 0.77 IU/mL — ABNORMAL HIGH (ref 0.30–0.70)

## 2021-05-31 MED ORDER — INSULIN ASPART 100 UNIT/ML IJ SOLN
0.0000 [IU] | Freq: Three times a day (TID) | INTRAMUSCULAR | Status: DC
  Administered 2021-06-01: 2 [IU] via SUBCUTANEOUS
  Administered 2021-06-01 – 2021-06-02 (×5): 3 [IU] via SUBCUTANEOUS
  Administered 2021-06-03: 2 [IU] via SUBCUTANEOUS
  Administered 2021-06-03: 3 [IU] via SUBCUTANEOUS

## 2021-05-31 MED ORDER — POTASSIUM CHLORIDE 10 MEQ/50ML IV SOLN
10.0000 meq | INTRAVENOUS | Status: AC
  Administered 2021-05-31 (×4): 10 meq via INTRAVENOUS
  Filled 2021-05-31 (×4): qty 50

## 2021-05-31 MED ORDER — AMIODARONE HCL 200 MG PO TABS
200.0000 mg | ORAL_TABLET | Freq: Two times a day (BID) | ORAL | Status: DC
  Administered 2021-05-31 – 2021-06-03 (×7): 200 mg via ORAL
  Filled 2021-05-31 (×7): qty 1

## 2021-05-31 MED ORDER — HEPARIN (PORCINE) 25000 UT/250ML-% IV SOLN
1750.0000 [IU]/h | INTRAVENOUS | Status: AC
  Administered 2021-05-31 (×2): 1800 [IU]/h via INTRAVENOUS
  Administered 2021-06-01: 1750 [IU]/h via INTRAVENOUS
  Filled 2021-05-31 (×2): qty 250

## 2021-05-31 MED ORDER — APIXABAN 5 MG PO TABS: 5.0000 mg | ORAL_TABLET | Freq: Two times a day (BID) | ORAL | Status: DC

## 2021-05-31 MED ORDER — DAPAGLIFLOZIN PROPANEDIOL 10 MG PO TABS
10.0000 mg | ORAL_TABLET | Freq: Every day | ORAL | Status: DC
  Administered 2021-05-31 – 2021-06-03 (×4): 10 mg via ORAL
  Filled 2021-05-31 (×4): qty 1

## 2021-05-31 MED ORDER — GERHARDT'S BUTT CREAM
TOPICAL_CREAM | Freq: Three times a day (TID) | CUTANEOUS | Status: DC
  Administered 2021-06-01 – 2021-06-02 (×3): 1 via TOPICAL
  Filled 2021-05-31 (×2): qty 1

## 2021-05-31 MED ORDER — CARVEDILOL 3.125 MG PO TABS
3.1250 mg | ORAL_TABLET | Freq: Two times a day (BID) | ORAL | Status: DC
  Administered 2021-05-31: 3.125 mg via ORAL
  Filled 2021-05-31 (×2): qty 1

## 2021-05-31 MED ORDER — LORAZEPAM 2 MG/ML IJ SOLN
1.0000 mg | Freq: Once | INTRAMUSCULAR | Status: AC
  Administered 2021-05-31: 1 mg via INTRAVENOUS
  Filled 2021-05-31: qty 1

## 2021-05-31 MED ORDER — INSULIN ASPART 100 UNIT/ML IJ SOLN: 0.0000 [IU] | Freq: Every day | INTRAMUSCULAR | Status: DC

## 2021-05-31 NOTE — Progress Notes (Signed)
Patient ID: Danielle EAVENSON, female   DOB: 1970/06/02, 51 y.o.   MRN: [id number]    Advanced Heart Failure Rounding Note  PCP-Cardiologist:  Dr. Aundra Dubin   Subjective:    CVP 5-6 this morning with co-ox 79%.  She is in NSR today on amiodarone gtt + heparin gtt.   TEE (5/19): moderate LV dilation with Ef 20-25%, moderately decreased RV systolic function.    Scr ok, 1.19>>1.29>>1.2>>0.99  LHC showed no significant coronary disease.   She remains confused.   Objective:   Weight Range: 89.8 kg Body mass index is 35.07 kg/m.   Vital Signs:   Temp:  [97.7 F (36.5 C)-99.3 F (37.4 C)] 97.7 F (36.5 C) (05/20 0755) Pulse Rate:  [97-120] 100 (05/20 0755) Resp:  [13-32] 22 (05/20 0755) BP: (110-145)/(75-97) 138/86 (05/20 0755) SpO2:  [92 %-100 %] 98 % (05/20 0755) Weight:  [89.8 kg] 89.8 kg (05/20 0606) Last BM Date : 05/29/21  Weight change: Filed Weights   05/30/21 0344 05/30/21 0907 05/31/21 0606  Weight: 90.9 kg 90.9 kg 89.8 kg    Intake/Output:   Intake/Output Summary (Last 24 hours) at 05/31/2021 0950 Last data filed at 05/31/2021 0607 Gross per 24 hour  Intake 340 ml  Output 1000 ml  Net -660 ml      Physical Exam    General: NAD Neck: No JVD, no thyromegaly or thyroid nodule.  Lungs: Clear to auscultation bilaterally with normal respiratory effort. CV: Nondisplaced PMI.  Heart regular S1/S2, no S3/S4, no murmur.  No peripheral edema.   Abdomen: Soft, nontender, no hepatosplenomegaly, no distention.  Skin: Intact without lesions or rashes.  Neurologic: Confused.  Extremities: No clubbing or cyanosis.  HEENT: Normal.   Telemetry   NSR 70s personally reviewed  EKG   N/A  Labs    CBC Recent Labs    05/30/21 0428 05/31/21 0545  WBC 11.1* 16.0*  HGB 15.6* 15.7*  HCT 46.1* 48.1*  MCV 93.5 95.4  PLT 389 [id number]*   Basic Metabolic Panel Recent Labs    05/29/21 0356 05/29/21 0816 05/30/21 0428 05/31/21 0545  NA 140   < > 142 143  K 2.9*   < >  3.3* 3.2*  CL 91*   < > 99 106  CO2 36*   < > 31 26  GLUCOSE 141*   < > 185* 202*  BUN 32*   < > 35* 29*  CREATININE 1.29*   < > 1.24* 0.99  CALCIUM 10.1   < > 10.1 9.2  MG 2.3  --  2.3  --    < > = values in this interval not displayed.   Liver Function Tests No results for input(s): AST, ALT, ALKPHOS, BILITOT, PROT, ALBUMIN in the last 72 hours.  No results for input(s): LIPASE, AMYLASE in the last 72 hours. Cardiac Enzymes No results for input(s): CKTOTAL, CKMB, CKMBINDEX, TROPONINI in the last 72 hours.  BNP: BNP (last 3 results) Recent Labs    02/14/21 0742 02/14/21 2153 05/17/21 0415  BNP 309.0* 162.0* 782.6*    ProBNP (last 3 results) No results for input(s): PROBNP in the last 8760 hours.   D-Dimer No results for input(s): DDIMER in the last 72 hours. Hemoglobin A1C No results for input(s): HGBA1C in the last 72 hours. Fasting Lipid Panel No results for input(s): CHOL, HDL, LDLCALC, TRIG, CHOLHDL, LDLDIRECT in the last 72 hours.  Thyroid Function Tests No results for input(s): TSH, T4TOTAL, T3FREE, THYROIDAB in the last 72  hours.  Invalid input(s): FREET3  Other results:   Imaging    No results found.   Medications:     Scheduled Medications:  amiodarone  200 mg Oral BID   arformoterol  15 mcg Nebulization BID   atorvastatin  20 mg Oral Daily   budesonide (PULMICORT) nebulizer solution  0.25 mg Nebulization BID   carvedilol  3.125 mg Oral BID WC   Chlorhexidine Gluconate Cloth  6 each Topical Q0600   dapagliflozin propanediol  10 mg Oral Daily   digoxin  0.125 mg Oral Daily   feeding supplement  237 mL Oral TID BM   feeding supplement (PROSource TF)  45 mL Per Tube BID   insulin aspart  0-9 Units Subcutaneous Q4H   mouth rinse  15 mL Mouth Rinse BID   pantoprazole  40 mg Oral Q1200   potassium chloride  40 mEq Oral BID   revefenacin  175 mcg Nebulization Daily   sacubitril-valsartan  1 tablet Oral BID   senna-docusate  1 tablet Oral BID    sodium chloride flush  10-40 mL Intracatheter Q12H   sodium chloride flush  3 mL Intravenous Q12H   sodium chloride flush  3 mL Intravenous Q12H   spironolactone  25 mg Oral Daily   torsemide  20 mg Oral Daily    Infusions:  sodium chloride     feeding supplement (OSMOLITE 1.5 CAL) Stopped (05/29/21 0000)   heparin 1,800 Units/hr (05/30/21 2307)    PRN Medications: sodium chloride, acetaminophen, ipratropium-albuterol, levalbuterol, ondansetron (ZOFRAN) IV, sodium chloride flush, sodium chloride flush    Patient Profile  Ms [place] is a 51 year old with a h/o chronic pain, anxiety, depression, and substance abuse.     Admitted with respiratory failure + A flutter and new systolic heart failure.   Echo EF 20-25%, RV severely reduced   Assessment/Plan   1. Acute hypoxemic respiratory failure: Suspect aspiration in setting of drug abuse (benzos/meth on UDS).  Has PNA + pulmonary edema. Sputum with Strep pneumo and S aureus.  Now extubated and off oxygen.  She has diuresed well.  - Off abx.  2. Acute systolic CHF: Nonischemic CMP.  No prior history of CHF.  Echo this admission showed LV EF < 20% with moderate LV dilation, severely decreased RV systolic function, moderate TR, normal IVC.  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 Also consider stress cardiomyopathy from sepsis/PNA.  No significant CAD on 5/18 cath. Cardioverted back to NSR on 5/19.  Off pressors. Wt down w/ diuresis. Creatinine stable at 0.99.  Co-ox 79% with CVP 5 today.  - Continue torsemide 20 mg daily and replace K.  - digoxin 0.125 mg daily.  - Continue spironolactone 25 mg daily.  - Continue Entresto 24/26 bid.  - Add dapagliflozin 10 mg daily.  - Add Coreg 3.125 mg bid.  - Too confused currently to cooperate with cardiac MRI.  Would arrange when mental status improves.  3. Atrial flutter: She had typical AFL.  AFL has been present since at least 2/23.   Concerned for component of tachy-mediated CMP.  S/p TEE-guided DCCV on 5/19. She is in NSR today.  - Transition to po amiodarone.  - Transition to Eliquis.    - Would ideally have ablation down the road.  4. Substance abuse: UDS with methamphetamines.  5. Hypokalemia: Replace.  6. Confusion: Still with delirium.   Loralie Champagne, 05/31/2021 9:50 AM

## 2021-05-31 NOTE — Progress Notes (Signed)
Pt refused CPAP tonight.

## 2021-05-31 NOTE — Progress Notes (Signed)
0003 Pt was agitated, restless and trying to climb out of the bed. Pt is alert and oriented x 1, disoriented to place, time and situation. Pt was reoriented easily. Fall precautions safety plan was discussed with the pt.  0020 Pt's bed alarm came off and pt was trying to climb out of the bed again. Dr. Margo Aye ( on call) was notified and ordered Ativan 1 mg IV x 1. Will continue to monitor pt.

## 2021-05-31 NOTE — Plan of Care (Signed)
  Problem: Clinical Measurements: Goal: Cardiovascular complication will be avoided Outcome: Progressing   Problem: Activity: Goal: Risk for activity intolerance will decrease Outcome: Progressing   Problem: Nutrition: Goal: Adequate nutrition will be maintained Outcome: Progressing   Problem: Coping: Goal: Level of anxiety will decrease Outcome: Progressing   

## 2021-05-31 NOTE — Progress Notes (Signed)
PROGRESS NOTE    [name]  [id number] DOB: [birth year] DOA: 05/16/2021 PCP: Lemmie Evens, MD   Brief Narrative:  51 year old female with pertinent PMH of anxiety, depression, chronic pain syndrome on multiple psych meds presents to [facility] on 5/5 with acute respiratory failure.  Patient recently admitted 02/14/2021 with similar episode of respiratory failure with sepsis and aspiration pneumonia and patient was intubated.  Patient left AMA on 02/21/2021.  Patient at home takes multiple psych meds: Xanax, amitriptyline, Lyrica, Trintellix, Ambien, citalopram.   5/5 admitted to [facility] intubated for resp failure; transferring to [facility]  5/7 notified of positive blood cultures for night, antibiotic regiment remains seen with broad coverage.  She is more alert and oriented this a.m. 5/8 severe LV and RV dysfunction, LVEF 20%.  Persistent hypoxia and hypercarbia.  5/9 weaned off paralytics and milnirone  5/10 weaned off of epoprostenol.  Vanco stopped.  5/11 weaned off of levophed.  Levaquin  5/12 poor synchrony vent requiring paralytics; Aflutter noted; PICC line placed  5/13 improved ventilator synchrony.  Now 5 L net negative.  Improving oxygenation. 5/15: echo with LVEF 20-25% and RV mildly reduced 5/16: extubated to bipap 5/18: Transition to [facility] - Cardiac cath 5/19: TEE/DCCV 19th - NG incidentally removed - SLP cleared - NG discontinued 5/20: Sundowning overnight requiring Ativan -poorly interactive for morning rounds  Assessment & Plan:   Active Problems:   Respiratory failure with hypoxia and hypercapnia (HCC)   Septic shock (HCC)   Acute encephalopathy   ARDS (adult respiratory distress syndrome) (HCC)   HFrEF (heart failure with reduced ejection fraction) (HCC)   Cor pulmonale, acute (HCC)  Shock, POA, resolved Septic vs cardiogenic - Off pressors, see below for further treatment details  Cor pulmonale, Acute (HCC) HFrEF (heart failure with reduced ejection fraction) EF 20%  - new with severe RV dysfunction Questionably sepsis induced myocardial dysfunction Cardiology following -cardiac catheterization 05/30/2021   Respiratory failure with hypoxia and hypercapnia ([place]), resolving ARDS, resolved Concurrent aspiration pneunomia vs edema, resolved -Extubated previously per PCCM -Wean oxygen as tolerated -currently on room air at rest, ambulatory oxygen screen pending -Completed antibiotics -Continue supportive care -Bipap HS/PRN -questionable underlying obesity hypoventilation syndrome versus sleep apnea, would likely benefit from outpatient sleep study once stabilized   AFib/Flutter  -Resolved, normal sinus rhythm today -Transition to p.o. amiodarone -TEE/DCCV 5/19 tolerated well -currently in normal sinus rhythm today   Acute Metabolic Encephalopathy, resolving Required NMB. Now lightening sedation.  Limit CNS depressants  Hypokalemia  -Follow repeat labs  DVT prophylaxis: Heparin drip Code Status: DNR Family Communication: None present  Status is: Inpt  Dispo: The patient is from: Home              Anticipated d/c is to: TBD              Anticipated d/c date is: 48-72h              Patient currently NOT medically stable for discharge  Consultants:  PCCM, Cardiology  Subjective: No acute issues/events overnight  Objective: Vitals:   05/30/21 1937 05/30/21 2004 05/30/21 2354 05/31/21 0606  BP:  (!) 142/85 (!) 145/97 126/78  Pulse: 97 (!) 103 (!) 112 97  Resp: (!) 27 20 20 20   Temp:  98.2 F (36.8 C) 98.6 F (37 C) 97.9 F (36.6 C)  TempSrc:  Oral Oral Oral  SpO2: 99% 98% 94% 96%  Weight:    89.8 kg  Height:  Intake/Output Summary (Last 24 hours) at 05/31/2021 0717 Last data filed at 05/31/2021 [id number] Gross per 24 hour  Intake 340 ml  Output 1000 ml  Net -660 ml    Filed Weights   05/30/21 0344 05/30/21 0907 05/31/21 0606  Weight: 90.9 kg 90.9 kg 89.8 kg    Examination:  General exam: Appears calm and comfortable   Respiratory system: Clear to auscultation. Respiratory effort normal. Cardiovascular system: Irregularly irregular. No JVD, murmurs, rubs, gallops or clicks. No pedal edema. Gastrointestinal system: Abdomen is nondistended, soft and nontender. No organomegaly or masses felt. Normal bowel sounds heard. Central nervous system: Alert and oriented. No focal neurological deficits. Extremities: Symmetric 5 x 5 power. Skin: No rashes, lesions or ulcers   Data Reviewed: I have personally reviewed following labs and imaging studies  CBC: Recent Labs  Lab 05/27/21 0306 05/27/21 0458 05/28/21 0248 05/28/21 0428 05/28/21 0439 05/29/21 0356 05/29/21 0816 05/30/21 0428 05/31/21 0545  WBC 5.9  --  9.4  --   --  10.0  --  11.1* 16.0*  HGB 13.9   < > 14.3   < > 14.6 14.8 15.3* 15.6* 15.7*  HCT 42.9   < > 43.1   < > 43.0 43.7 45.0 46.1* 48.1*  MCV 97.5  --  95.1  --   --  93.4  --  93.5 95.4  PLT 228  --  274  --   --  331  --  389 454*   < > = values in this interval not displayed.    Basic Metabolic Panel: Recent Labs  Lab 05/25/21 0319 05/25/21 1052 05/26/21 0315 05/26/21 0359 05/28/21 0248 05/28/21 0428 05/28/21 1119 05/29/21 0356 05/29/21 0816 05/29/21 1614 05/30/21 0428 05/31/21 0545  NA 139   < > 139   < > 138   < > 139 140 138 139 142 143  K 3.4*   < > 3.4*   < > 4.8   < > 3.2* 2.9* 3.1* 2.9* 3.3* 3.2*  CL 92*  --  95*   < > 89*  --  86* 91*  --  97* 99 106  CO2 37*  --  35*   < > 35*  --  39* 36*  --  31 31 26   GLUCOSE 147*  --  140*   < > 157*  --  153* 141*  --  184* 185* 202*  BUN 21*  --  22*   < > 25*  --  26* 32*  --  35* 35* 29*  CREATININE 1.07*  --  0.95   < > 0.99  --  1.19* 1.29*  --  1.18* 1.24* 0.99  CALCIUM 9.0  --  9.4   < > 9.8  --  10.3 10.1  --  9.8 10.1 9.2  MG 2.1  --  2.2  --  2.1  --   --  2.3  --   --  2.3  --    < > = values in this interval not displayed.    GFR: Estimated Creatinine Clearance: 72.3 mL/min (by C-G formula based on SCr of  0.99 mg/dL). Liver Function Tests: Recent Labs  Lab 05/27/21 0306  AST 23  ALT 17  ALKPHOS 149*  BILITOT 0.7  PROT 6.6  ALBUMIN 2.8*    No results for input(s): LIPASE, AMYLASE in the last 168 hours. Recent Labs  Lab 05/29/21 1127  AMMONIA 20    Coagulation Profile: Recent Labs  Lab 05/29/21  1917  INR 1.1    Cardiac Enzymes: No results for input(s): CKTOTAL, CKMB, CKMBINDEX, TROPONINI in the last 168 hours. BNP (last 3 results) No results for input(s): PROBNP in the last 8760 hours. HbA1C: No results for input(s): HGBA1C in the last 72 hours. CBG: Recent Labs  Lab 05/30/21 1120 05/30/21 1604 05/30/21 2002 05/30/21 2350 05/31/21 0602  GLUCAP 194* 235* 145* 222* 222*    Lipid Profile: No results for input(s): CHOL, HDL, LDLCALC, TRIG, CHOLHDL, LDLDIRECT in the last 72 hours. Thyroid Function Tests: No results for input(s): TSH, T4TOTAL, FREET4, T3FREE, THYROIDAB in the last 72 hours. Anemia Panel: No results for input(s): VITAMINB12, FOLATE, FERRITIN, TIBC, IRON, RETICCTPCT in the last 72 hours. Sepsis Labs: No results for input(s): PROCALCITON, LATICACIDVEN in the last 168 hours.  No results found for this or any previous visit (from the past 240 hour(s)).       Radiology Studies: CARDIAC CATHETERIZATION  Result Date: 05/29/2021 1. Normal LVEDP with RA pressure elevated to 12 mmHg (measured by PICC). 2. Elevated cardiac output (from PICC). 3. Angiographically normal coronaries. 20 cc contrast used.        Scheduled Meds:  arformoterol  15 mcg Nebulization BID   atorvastatin  20 mg Oral Daily   budesonide (PULMICORT) nebulizer solution  0.25 mg Nebulization BID   Chlorhexidine Gluconate Cloth  6 each Topical Q0600   digoxin  0.125 mg Oral Daily   feeding supplement  237 mL Oral TID BM   feeding supplement (PROSource TF)  45 mL Per Tube BID   insulin aspart  0-9 Units Subcutaneous Q4H   mouth rinse  15 mL Mouth Rinse BID   pantoprazole  40 mg  Oral Q1200   potassium chloride  40 mEq Oral BID   revefenacin  175 mcg Nebulization Daily   sacubitril-valsartan  1 tablet Oral BID   senna-docusate  1 tablet Oral BID   sodium chloride flush  10-40 mL Intracatheter Q12H   sodium chloride flush  3 mL Intravenous Q12H   sodium chloride flush  3 mL Intravenous Q12H   spironolactone  25 mg Oral Daily   torsemide  20 mg Oral Daily   Continuous Infusions:  sodium chloride     amiodarone 60 mg/hr (05/31/21 0551)   feeding supplement (OSMOLITE 1.5 CAL) Stopped (05/29/21 0000)   heparin 1,800 Units/hr (05/30/21 2307)     LOS: 15 days   Time spent: 27min  Cylee Dattilo C Marisabel Macpherson, DO [facility]  If 7PM-7AM, please contact night-coverage [email or web address]  05/31/2021, 7:17 AM

## 2021-05-31 NOTE — Progress Notes (Addendum)
ANTICOAGULATION CONSULT NOTE - Follow Up Consult  Pharmacy Consult for Heparin Indication: atrial fibrillation  Allergies  Allergen Reactions   Penicillins Anaphylaxis    Has patient had a PCN reaction causing immediate rash, facial/tongue/throat swelling, SOB or lightheadedness with hypotension: No Has patient had a PCN reaction causing severe rash involving mucus membranes or skin necrosis: No Has patient had a PCN reaction that required hospitalization No Has patient had a PCN reaction occurring within the last 10 years: No If all of the above answers are "NO", then may proceed with Cephalosporin use.    Ibuprofen Nausea And Vomiting    GI upset, Shakes   Peppermint Flavor [Flavoring Agent]     Allergic to pepper the spice   Aspirin Nausea And Vomiting    Gi upset    Patient Measurements: Height: 5\' 3"  (160 cm) Weight: 89.8 kg (197 lb 15.6 oz) IBW/kg (Calculated) : 52.4 Heparin Dosing Weight: 73.1 kg  Vital Signs: Temp: 99.6 F (37.6 C) (05/20 1408) Temp Source: Oral (05/20 1408) BP: 112/74 (05/20 1408) Pulse Rate: 100 (05/20 0755)  Labs: Recent Labs    05/29/21 0356 05/29/21 0816 05/29/21 1614 05/29/21 1917 05/30/21 0428 05/31/21 0545 05/31/21 0745  HGB 14.8 15.3*  --   --  15.6* 15.7*  --   HCT 43.7 45.0  --   --  46.1* 48.1*  --   PLT 331  --   --   --  389 454*  --   LABPROT  --   --   --  14.2  --   --   --   INR  --   --   --  1.1  --   --   --   HEPARINUNFRC 0.46  --   --   --  0.28* >1.10* 0.61  CREATININE 1.29*  --  1.18*  --  1.24* 0.99  --     Estimated Creatinine Clearance: 72.3 mL/min (by C-G formula based on SCr of 0.99 mg/dL).   Medications:  Scheduled:   amiodarone  200 mg Oral BID   arformoterol  15 mcg Nebulization BID   atorvastatin  20 mg Oral Daily   budesonide (PULMICORT) nebulizer solution  0.25 mg Nebulization BID   carvedilol  3.125 mg Oral BID WC   Chlorhexidine Gluconate Cloth  6 each Topical Q0600   dapagliflozin  propanediol  10 mg Oral Daily   digoxin  0.125 mg Oral Daily   feeding supplement  237 mL Oral TID BM   feeding supplement (PROSource TF)  45 mL Per Tube BID   insulin aspart  0-9 Units Subcutaneous Q4H   mouth rinse  15 mL Mouth Rinse BID   pantoprazole  40 mg Oral Q1200   potassium chloride  40 mEq Oral BID   revefenacin  175 mcg Nebulization Daily   sacubitril-valsartan  1 tablet Oral BID   senna-docusate  1 tablet Oral BID   sodium chloride flush  10-40 mL Intracatheter Q12H   sodium chloride flush  3 mL Intravenous Q12H   sodium chloride flush  3 mL Intravenous Q12H   spironolactone  25 mg Oral Daily   torsemide  20 mg Oral Daily   Infusions:   sodium chloride     feeding supplement (OSMOLITE 1.5 CAL) Stopped (05/29/21 0000)   heparin 1,800 Units/hr (05/31/21 1425)   potassium chloride 10 mEq (05/31/21 1426)    Assessment: 51 yo F initially started on IV heparin for rule out PE, then switched  to heparin SQ for VTE prophylaxis. Now with persistent Afib (CHADSVASC 3). Pharmacy consulted for heparin dosing.  Heparin level was drawn ~0545 and elevated at > 1.10. Redrawn this morning ~0745 (via peripheral stick) - heparin level is therapeutic at 0.61, on 1800 units/hr. Hgb 15.7, plt 454. No line issues or signs/symptoms of bleeding noted per RN.   Goal of Therapy:  Heparin level 0.3-0.7 units/ml Monitor platelets by anticoagulation protocol: Yes   Plan:  Continue heparin infusion at 1800 units/hr. Check ~6 hr heparin level.  Daily CBC, heparin level. Monitor for signs/symptoms of bleeding. Follow-up Cardiology plans to transition to Eliquis. Discussed with Dr. Aundra Dubin, will re-evaluate on 5/21 AM.    Vance Peper, PharmD PGY1 Pharmacy Resident Phone [phone] 05/31/2021 3:00 PM   Please check AMION for all [place] phone numbers After 10:00 PM, call [place] [phone]

## 2021-05-31 NOTE — Progress Notes (Signed)
ANTICOAGULATION CONSULT NOTE - Follow Up Consult  Pharmacy Consult for Heparin Indication: atrial fibrillation  Allergies  Allergen Reactions   Penicillins Anaphylaxis    Has patient had a PCN reaction causing immediate rash, facial/tongue/throat swelling, SOB or lightheadedness with hypotension: No Has patient had a PCN reaction causing severe rash involving mucus membranes or skin necrosis: No Has patient had a PCN reaction that required hospitalization No Has patient had a PCN reaction occurring within the last 10 years: No If all of the above answers are "NO", then may proceed with Cephalosporin use.    Ibuprofen Nausea And Vomiting    GI upset, Shakes   Peppermint Flavor [Flavoring Agent]     Allergic to pepper the spice   Aspirin Nausea And Vomiting    Gi upset    Patient Measurements: Height: 5\' 3"  (160 cm) Weight: 89.8 kg (197 lb 15.6 oz) IBW/kg (Calculated) : 52.4 Heparin Dosing Weight: 73.1 kg  Vital Signs: Temp: 99.6 F (37.6 C) (05/20 1408) Temp Source: Oral (05/20 1408) BP: 112/74 (05/20 1408) Pulse Rate: 100 (05/20 0755)  Labs: Recent Labs    05/29/21 0356 05/29/21 0816 05/29/21 1614 05/29/21 1917 05/30/21 0428 05/31/21 0545 05/31/21 0745 05/31/21 1505  HGB 14.8 15.3*  --   --  15.6* 15.7*  --   --   HCT 43.7 45.0  --   --  46.1* 48.1*  --   --   PLT 331  --   --   --  389 454*  --   --   LABPROT  --   --   --  14.2  --   --   --   --   INR  --   --   --  1.1  --   --   --   --   HEPARINUNFRC 0.46  --   --   --  0.28* >1.10* 0.61 0.77*  CREATININE 1.29*  --  1.18*  --  1.24* 0.99  --   --      Estimated Creatinine Clearance: 72.3 mL/min (by C-G formula based on SCr of 0.99 mg/dL).   Medications:   Infusions:   sodium chloride     feeding supplement (OSMOLITE 1.5 CAL) Stopped (05/29/21 0000)   heparin 1,800 Units/hr (05/31/21 1425)    Assessment: 51 yo F initially started on IV heparin for rule out PE, then switched to heparin SQ for  VTE prophylaxis. Now with persistent Afib (CHADSVASC 3). Pharmacy consulted for heparin dosing.  Confirmatory heparin level slightly elevated at 0.77. No signs or symptoms of bleed. Has been subtherapeutic during admission on lower infusion rates. Will reduce infusion rate slightly and follow up in the AM.   Goal of Therapy:  Heparin level 0.3-0.7 units/ml Monitor platelets by anticoagulation protocol: Yes   Plan:  Reduce heparin infusion slightly to 1750 units/hr. Daily CBC, heparin level. Monitor for signs/symptoms of bleeding. Follow-up Cardiology plans to transition to Eliquis. Discussed with Dr. Aundra Dubin, will re-evaluate on 5/21 AM.   Cephus Slater, PharmD, MBA, [place] [facility]acy Resident [phone] 05/31/2021 3:57 PM

## 2021-06-01 DIAGNOSIS — I4892 Unspecified atrial flutter: Secondary | ICD-10-CM | POA: Diagnosis not present

## 2021-06-01 DIAGNOSIS — G9340 Encephalopathy, unspecified: Secondary | ICD-10-CM | POA: Diagnosis not present

## 2021-06-01 DIAGNOSIS — I5043 Acute on chronic combined systolic (congestive) and diastolic (congestive) heart failure: Secondary | ICD-10-CM | POA: Diagnosis not present

## 2021-06-01 DIAGNOSIS — J80 Acute respiratory distress syndrome: Secondary | ICD-10-CM | POA: Diagnosis not present

## 2021-06-01 DIAGNOSIS — J9602 Acute respiratory failure with hypercapnia: Secondary | ICD-10-CM | POA: Diagnosis not present

## 2021-06-01 LAB — BASIC METABOLIC PANEL
Anion gap: 8 (ref 5–15)
BUN: 30 mg/dL — ABNORMAL HIGH (ref 6–20)
CO2: 29 mmol/L (ref 22–32)
Calcium: 9.8 mg/dL (ref 8.9–10.3)
Chloride: 108 mmol/L (ref 98–111)
Creatinine, Ser: 1.07 mg/dL — ABNORMAL HIGH (ref 0.44–1.00)
GFR, Estimated: >60 mL/min (ref >60)
Glucose, Bld: 138 mg/dL — ABNORMAL HIGH (ref 70–99)
Potassium: 3.8 mmol/L (ref 3.5–5.1)
Sodium: 145 mmol/L (ref 135–145)

## 2021-06-01 LAB — HEPARIN LEVEL (UNFRACTIONATED): Heparin Unfractionated: 0.68 IU/mL (ref 0.30–0.70)

## 2021-06-01 LAB — CBC
HCT: 50 % — ABNORMAL HIGH (ref 36.0–46.0)
Hemoglobin: 16.3 g/dL — ABNORMAL HIGH (ref 12.0–15.0)
MCH: 31 pg (ref 26.0–34.0)
MCHC: 32.6 g/dL (ref 30.0–36.0)
MCV: 95.2 fL (ref 80.0–100.0)
Platelets: 384 10*3/uL (ref 150–400)
RBC: 5.25 MIL/uL — ABNORMAL HIGH (ref 3.87–5.11)
RDW: 14.3 % (ref 11.5–15.5)
WBC: 12.3 10*3/uL — ABNORMAL HIGH (ref 4.0–10.5)
nRBC: 0 % (ref 0.0–0.2)

## 2021-06-01 LAB — GLUCOSE, CAPILLARY
Glucose-Capillary: 156 mg/dL — ABNORMAL HIGH (ref 70–99)
Glucose-Capillary: 169 mg/dL — ABNORMAL HIGH (ref 70–99)
Glucose-Capillary: 153 mg/dL — ABNORMAL HIGH (ref 70–99)
Glucose-Capillary: 150 mg/dL — ABNORMAL HIGH (ref 70–99)
Glucose-Capillary: 130 mg/dL — ABNORMAL HIGH (ref 70–99)

## 2021-06-01 MED ORDER — CARVEDILOL 6.25 MG PO TABS
6.2500 mg | ORAL_TABLET | Freq: Two times a day (BID) | ORAL | Status: DC
Start: 2021-06-01 — End: 2021-06-03
  Administered 2021-06-01 – 2021-06-03 (×5): 6.25 mg via ORAL
  Filled 2021-06-01 (×5): qty 1

## 2021-06-01 MED ORDER — POTASSIUM CHLORIDE CRYS ER 20 MEQ PO TBCR
40.0000 meq | EXTENDED_RELEASE_TABLET | Freq: Every day | ORAL | Status: DC
  Administered 2021-06-01 – 2021-06-03 (×3): 40 meq via ORAL
  Filled 2021-06-01 (×2): qty 2

## 2021-06-01 MED ORDER — APIXABAN 5 MG PO TABS
5.0000 mg | ORAL_TABLET | Freq: Two times a day (BID) | ORAL | Status: DC
  Administered 2021-06-01 – 2021-06-03 (×5): 5 mg via ORAL
  Filled 2021-06-01 (×5): qty 1

## 2021-06-01 NOTE — Progress Notes (Signed)
   06/01/21 1525  Mobility  Activity Refused mobility (Declined for unspecified reasons)   Will f/u as time permits.

## 2021-06-01 NOTE — Progress Notes (Signed)
Pt refused BiPAP for tonight.

## 2021-06-01 NOTE — Progress Notes (Addendum)
PROGRESS NOTE    [name] FAILS  [id number] DOB: [birth year] DOA: 05/16/2021 PCP: Lemmie Evens, MD   Brief Narrative:  51 year old female with pertinent PMH of anxiety, depression, chronic pain syndrome on multiple psych meds presents to [facility] on 5/5 with acute respiratory failure.  Patient recently admitted 02/14/2021 with similar episode of respiratory failure with sepsis and aspiration pneumonia and patient was intubated.  Patient left AMA on 02/21/2021.  Patient at home takes multiple psych meds: Xanax, amitriptyline, Lyrica, Trintellix, Ambien, citalopram.   5/5 admitted to [facility] intubated for resp failure; transferring to [facility]  5/7 notified of positive blood cultures for night, antibiotic regiment remains seen with broad coverage.  She is more alert and oriented this a.m. 5/8 severe LV and RV dysfunction, LVEF 20%.  Persistent hypoxia and hypercarbia.  5/9 weaned off paralytics and milnirone  5/10 weaned off of epoprostenol.  Vanco stopped.  5/11 weaned off of levophed.  Levaquin  5/12 poor synchrony vent requiring paralytics; Aflutter noted; PICC line placed  5/13 improved ventilator synchrony.  Now 5 L net negative.  Improving oxygenation. 5/15: echo with LVEF 20-25% and RV mildly reduced 5/16: extubated to bipap 5/18: Transition to [facility] - Cardiac cath 5/19: TEE/DCCV 19th - NG incidentally removed - SLP cleared - NG discontinued 5/20: Sundowning overnight requiring Ativan -poorly interactive for morning rounds 5/21: Much more awake alert oriented this morning, flight of ideas/rapid speech ongoing, but appears to be much more coherent than previous.  Assessment & Plan:   Active Problems:   Respiratory failure with hypoxia and hypercapnia (HCC)   Septic shock (HCC)   Acute encephalopathy   ARDS (adult respiratory distress syndrome) (HCC)   HFrEF (heart failure with reduced ejection fraction) (HCC)   Cor pulmonale, acute (HCC)  Shock, POA, resolved Septic vs cardiogenic - Off  pressors, see below for further treatment details  Cor pulmonale, Acute (HCC) HFrEF (heart failure with reduced ejection fraction) EF 20% - new with severe RV dysfunction Questionably sepsis induced myocardial dysfunction Cardiology following -cardiac catheterization 05/30/2021   Respiratory failure with hypoxia and hypercapnia ([place]), resolving ARDS, resolved Concurrent aspiration pneunomia vs edema, resolved -Extubated previously per PCCM -Wean oxygen as tolerated -currently on room air at rest, ambulatory oxygen screen pending -Completed antibiotics -Continue supportive care -Bipap HS/PRN -questionable underlying obesity hypoventilation syndrome versus sleep apnea, would likely benefit from outpatient sleep study once stabilized   AFib/Flutter  -Resolved, normal sinus rhythm today -Continue p.o. amiodarone -TEE/DCCV 5/19 tolerated well -currently in normal sinus rhythm today   Acute Metabolic Encephalopathy, resolving Improving, avoid benzodiazepines given mental status over the past 24 hours has been somewhat depressed due to previously administered Ativan  Hypokalemia  -Follow repeat labs  DVT prophylaxis: Heparin drip Code Status: DNR Family Communication: None present  Status is: Inpt  Dispo: The patient is from: Home              Anticipated d/c is to: TBD              Anticipated d/c date is: 48-72h              Patient currently NOT medically stable for discharge  Consultants:  PCCM, Cardiology  Subjective: No acute issues/events overnight  Objective: Vitals:   05/31/21 1408 05/31/21 1628 05/31/21 1935 06/01/21 0544  BP: 112/74  (!) 152/97   Pulse:   90   Resp: (!) 21 19 (!) 22 20  Temp: 99.6 F (37.6 C) 98 F (36.7 C) 99.4 F (  37.4 C) 98.6 F (37 C)  TempSrc: Oral Oral Axillary Oral  SpO2: 94%  96% 98%  Weight:    87.1 kg  Height:        Intake/Output Summary (Last 24 hours) at 06/01/2021 0721 Last data filed at 06/01/2021 0452 Gross per 24 hour   Intake 1957.27 ml  Output 1200 ml  Net 757.27 ml    Filed Weights   05/30/21 0907 05/31/21 0606 06/01/21 0544  Weight: 90.9 kg 89.8 kg 87.1 kg    Examination:  General exam: Appears calm and comfortable  Respiratory system: Clear to auscultation. Respiratory effort normal. Cardiovascular system: Irregularly irregular. No JVD, murmurs, rubs, gallops or clicks. No pedal edema. Gastrointestinal system: Abdomen is nondistended, soft and nontender. No organomegaly or masses felt. Normal bowel sounds heard. Central nervous system: Alert and oriented. No focal neurological deficits. Extremities: Symmetric 5 x 5 power. Skin: No rashes, lesions or ulcers   Data Reviewed: I have personally reviewed following labs and imaging studies  CBC: Recent Labs  Lab 05/28/21 0248 05/28/21 0428 05/29/21 0356 05/29/21 0816 05/30/21 0428 05/31/21 0545 06/01/21 0314  WBC 9.4  --  10.0  --  11.1* 16.0* 12.3*  HGB 14.3   < > 14.8 15.3* 15.6* 15.7* 16.3*  HCT 43.1   < > 43.7 45.0 46.1* 48.1* 50.0*  MCV 95.1  --  93.4  --  93.5 95.4 95.2  PLT 274  --  331  --  389 454* 384   < > = values in this interval not displayed.    Basic Metabolic Panel: Recent Labs  Lab 05/26/21 0315 05/26/21 0359 05/28/21 0248 05/28/21 0428 05/29/21 0356 05/29/21 0816 05/29/21 1614 05/30/21 0428 05/31/21 0545 06/01/21 0314  NA 139   < > 138   < > 140 138 139 142 143 145  K 3.4*   < > 4.8   < > 2.9* 3.1* 2.9* 3.3* 3.2* 3.8  CL 95*   < > 89*   < > 91*  --  97* 99 106 108  CO2 35*   < > 35*   < > 36*  --  31 31 26 29   GLUCOSE 140*   < > 157*   < > 141*  --  184* 185* 202* 138*  BUN 22*   < > 25*   < > 32*  --  35* 35* 29* 30*  CREATININE 0.95   < > 0.99   < > 1.29*  --  1.18* 1.24* 0.99 1.07*  CALCIUM 9.4   < > 9.8   < > 10.1  --  9.8 10.1 9.2 9.8  MG 2.2  --  2.1  --  2.3  --   --  2.3  --   --    < > = values in this interval not displayed.    GFR: Estimated Creatinine Clearance: 65.8 mL/min (A) (by  C-G formula based on SCr of 1.07 mg/dL (H)). Liver Function Tests: Recent Labs  Lab 05/27/21 0306  AST 23  ALT 17  ALKPHOS 149*  BILITOT 0.7  PROT 6.6  ALBUMIN 2.8*    No results for input(s): LIPASE, AMYLASE in the last 168 hours. Recent Labs  Lab 05/29/21 1127  AMMONIA 20    Coagulation Profile: Recent Labs  Lab 05/29/21 1917  INR 1.1    Cardiac Enzymes: No results for input(s): CKTOTAL, CKMB, CKMBINDEX, TROPONINI in the last 168 hours. BNP (last 3 results) No results for input(s): PROBNP in  the last 8760 hours. HbA1C: No results for input(s): HGBA1C in the last 72 hours. CBG: Recent Labs  Lab 05/31/21 0753 05/31/21 1249 05/31/21 1626 05/31/21 2113 06/01/21 0120  GLUCAP 187* 180* 182* 177* 156*    Lipid Profile: No results for input(s): CHOL, HDL, LDLCALC, TRIG, CHOLHDL, LDLDIRECT in the last 72 hours. Thyroid Function Tests: No results for input(s): TSH, T4TOTAL, FREET4, T3FREE, THYROIDAB in the last 72 hours. Anemia Panel: No results for input(s): VITAMINB12, FOLATE, FERRITIN, TIBC, IRON, RETICCTPCT in the last 72 hours. Sepsis Labs: No results for input(s): PROCALCITON, LATICACIDVEN in the last 168 hours.  No results found for this or any previous visit (from the past 240 hour(s)).       Radiology Studies: No results found.      Scheduled Meds:  amiodarone  200 mg Oral BID   arformoterol  15 mcg Nebulization BID   atorvastatin  20 mg Oral Daily   budesonide (PULMICORT) nebulizer solution  0.25 mg Nebulization BID   carvedilol  3.125 mg Oral BID WC   Chlorhexidine Gluconate Cloth  6 each Topical Q0600   dapagliflozin propanediol  10 mg Oral Daily   digoxin  0.125 mg Oral Daily   feeding supplement  237 mL Oral TID BM   feeding supplement (PROSource TF)  45 mL Per Tube BID   Gerhardt's butt cream   Topical TID   insulin aspart  0-15 Units Subcutaneous TID WC   insulin aspart  0-5 Units Subcutaneous QHS   mouth rinse  15 mL Mouth Rinse  BID   pantoprazole  40 mg Oral Q1200   potassium chloride  40 mEq Oral BID   revefenacin  175 mcg Nebulization Daily   sacubitril-valsartan  1 tablet Oral BID   senna-docusate  1 tablet Oral BID   sodium chloride flush  10-40 mL Intracatheter Q12H   sodium chloride flush  3 mL Intravenous Q12H   sodium chloride flush  3 mL Intravenous Q12H   spironolactone  25 mg Oral Daily   torsemide  20 mg Oral Daily   Continuous Infusions:  sodium chloride     feeding supplement (OSMOLITE 1.5 CAL) Stopped (05/29/21 0000)   heparin 1,750 Units/hr (06/01/21 0611)     LOS: 16 days   Time spent: 64min  Jatavian Calica C Landrey Mahurin, DO [facility]  If 7PM-7AM, please contact night-coverage [email or web address]  06/01/2021, 7:21 AM

## 2021-06-01 NOTE — Progress Notes (Signed)
ANTICOAGULATION CONSULT NOTE - Follow Up Consult  Pharmacy Consult for Heparin >> Eliquis Indication: atrial fibrillation  Allergies  Allergen Reactions   Penicillins Anaphylaxis    Has patient had a PCN reaction causing immediate rash, facial/tongue/throat swelling, SOB or lightheadedness with hypotension: No Has patient had a PCN reaction causing severe rash involving mucus membranes or skin necrosis: No Has patient had a PCN reaction that required hospitalization No Has patient had a PCN reaction occurring within the last 10 years: No If all of the above answers are "NO", then may proceed with Cephalosporin use.    Ibuprofen Nausea And Vomiting    GI upset, Shakes   Peppermint Flavor [Flavoring Agent]     Allergic to pepper the spice   Aspirin Nausea And Vomiting    Gi upset    Patient Measurements: Height: 5\' 3"  (160 cm) Weight: 87.1 kg (192 lb 0.3 oz) IBW/kg (Calculated) : 52.4 Heparin Dosing Weight: 73.1 kg  Vital Signs: Temp: 98.6 F (37 C) (05/21 0544) Temp Source: Oral (05/21 0544)  Labs: Recent Labs    05/29/21 1614 05/29/21 1917 05/30/21 0428 05/31/21 0545 05/31/21 0745 05/31/21 1505 06/01/21 0313 06/01/21 0314  HGB   < >  --  15.6* 15.7*  --   --   --  16.3*  HCT  --   --  46.1* 48.1*  --   --   --  50.0*  PLT  --   --  389 454*  --   --   --  384  LABPROT  --  14.2  --   --   --   --   --   --   INR  --  1.1  --   --   --   --   --   --   HEPARINUNFRC   < >  --  0.28* >1.10* 0.61 0.77* 0.68  --   CREATININE  --   --  1.24* 0.99  --   --   --  1.07*   < > = values in this interval not displayed.    Estimated Creatinine Clearance: 65.8 mL/min (A) (by C-G formula based on SCr of 1.07 mg/dL (H)).   Medications:  Scheduled:   amiodarone  200 mg Oral BID   arformoterol  15 mcg Nebulization BID   atorvastatin  20 mg Oral Daily   budesonide (PULMICORT) nebulizer solution  0.25 mg Nebulization BID   carvedilol  3.125 mg Oral BID WC   Chlorhexidine  Gluconate Cloth  6 each Topical Q0600   dapagliflozin propanediol  10 mg Oral Daily   digoxin  0.125 mg Oral Daily   feeding supplement  237 mL Oral TID BM   feeding supplement (PROSource TF)  45 mL Per Tube BID   Gerhardt's butt cream   Topical TID   insulin aspart  0-15 Units Subcutaneous TID WC   insulin aspart  0-5 Units Subcutaneous QHS   mouth rinse  15 mL Mouth Rinse BID   pantoprazole  40 mg Oral Q1200   potassium chloride  40 mEq Oral BID   revefenacin  175 mcg Nebulization Daily   sacubitril-valsartan  1 tablet Oral BID   senna-docusate  1 tablet Oral BID   sodium chloride flush  10-40 mL Intracatheter Q12H   sodium chloride flush  3 mL Intravenous Q12H   sodium chloride flush  3 mL Intravenous Q12H   spironolactone  25 mg Oral Daily   torsemide  20 mg  Oral Daily   Infusions:   sodium chloride     feeding supplement (OSMOLITE 1.5 CAL) Stopped (05/29/21 0000)   heparin 1,750 Units/hr (06/01/21 [id number])    Assessment: 51 yo F initially started on IV heparin for rule out PE, then switched to heparin SQ for VTE prophylaxis. Now with persistent Afib (CHADSVASC 3). Pharmacy consulted for heparin dosing.  Heparin level today is therapeutic at 0.68, on 1750 units/hr. Hgb 16.3, plt 384. No line issues or signs/symptoms of bleeding noted. Per patient's RN, patient is taking oral meds; discussed with Dr. Aundra Dubin - ok to transition from IV heparin to Eliquis this morning.   Goal of Therapy:  Heparin level 0.3-0.7 units/ml Monitor platelets by anticoagulation protocol: Yes   Plan:  Stop IV heparin. Start Eliquis 5 mg PO BID.  Monitor CBC and for signs/symptoms of bleeding.   Vance Peper, PharmD PGY1 Pharmacy Resident Phone [phone] 06/01/2021 9:05 AM   Please check AMION for all Early phone numbers After 10:00 PM, call [place] [phone]

## 2021-06-01 NOTE — Progress Notes (Signed)
Patient ID: [name], female   DOB: 1970-09-10, 51 y.o.   MRN: [id number]     Advanced Heart Failure Rounding Note  PCP-Cardiologist:  Dr. Aundra Dubin   Subjective:    CVP 3-4 this morning.  She is in NSR today on amiodarone with PACs.   TEE (5/19): moderate LV dilation with Ef 20-25%, moderately decreased RV systolic function.    Scr ok, 1.19>>1.29>>1.2>>0.99>>1.07  LHC showed no significant coronary disease.   More clear today, seems fully oriented.   Objective:   Weight Range: 87.1 kg Body mass index is 34.01 kg/m.   Vital Signs:   Temp:  [98 F (36.7 C)-99.6 F (37.6 C)] 98.6 F (37 C) (05/21 0544) Pulse Rate:  [90] 90 (05/20 1935) Resp:  [19-22] 20 (05/21 0544) BP: (112-152)/(74-97) 152/97 (05/20 1935) SpO2:  [94 %-98 %] 98 % (05/21 0544) Weight:  [87.1 kg] 87.1 kg (05/21 0544) Last BM Date : 06/01/21  Weight change: Filed Weights   05/30/21 0907 05/31/21 0606 06/01/21 0544  Weight: 90.9 kg 89.8 kg 87.1 kg    Intake/Output:   Intake/Output Summary (Last 24 hours) at 06/01/2021 1021 Last data filed at 06/01/2021 0452 Gross per 24 hour  Intake 1897.27 ml  Output 1200 ml  Net 697.27 ml      Physical Exam    General: NAD Neck: No JVD, no thyromegaly or thyroid nodule.  Lungs: Clear to auscultation bilaterally with normal respiratory effort. CV: Nondisplaced PMI.  Heart regular S1/S2, no S3/S4, no murmur.  No peripheral edema.   Abdomen: Soft, nontender, no hepatosplenomegaly, no distention.  Skin: Intact without lesions or rashes.  Neurologic: Alert and oriented x 3.  Psych: Normal affect. Extremities: No clubbing or cyanosis.  HEENT: Normal.   Telemetry   NSR 70s-80s with PACs personally reviewed  EKG   N/A  Labs    CBC Recent Labs    05/31/21 0545 06/01/21 0314  WBC 16.0* 12.3*  HGB 15.7* 16.3*  HCT 48.1* 50.0*  MCV 95.4 95.2  PLT 454* 0000000   Basic Metabolic Panel Recent Labs    05/30/21 0428 05/31/21 0545 06/01/21 0314  NA  142 143 145  K 3.3* 3.2* 3.8  CL 99 106 108  CO2 31 26 29   GLUCOSE 185* 202* 138*  BUN 35* 29* 30*  CREATININE 1.24* 0.99 1.07*  CALCIUM 10.1 9.2 9.8  MG 2.3  --   --    Liver Function Tests No results for input(s): AST, ALT, ALKPHOS, BILITOT, PROT, ALBUMIN in the last 72 hours.  No results for input(s): LIPASE, AMYLASE in the last 72 hours. Cardiac Enzymes No results for input(s): CKTOTAL, CKMB, CKMBINDEX, TROPONINI in the last 72 hours.  BNP: BNP (last 3 results) Recent Labs    02/14/21 0742 02/14/21 2153 05/17/21 0415  BNP 309.0* 162.0* 782.6*    ProBNP (last 3 results) No results for input(s): PROBNP in the last 8760 hours.   D-Dimer No results for input(s): DDIMER in the last 72 hours. Hemoglobin A1C No results for input(s): HGBA1C in the last 72 hours. Fasting Lipid Panel No results for input(s): CHOL, HDL, LDLCALC, TRIG, CHOLHDL, LDLDIRECT in the last 72 hours.  Thyroid Function Tests No results for input(s): TSH, T4TOTAL, T3FREE, THYROIDAB in the last 72 hours.  Invalid input(s): FREET3  Other results:   Imaging    No results found.   Medications:     Scheduled Medications:  amiodarone  200 mg Oral BID   apixaban  5 mg  Oral BID   arformoterol  15 mcg Nebulization BID   atorvastatin  20 mg Oral Daily   budesonide (PULMICORT) nebulizer solution  0.25 mg Nebulization BID   carvedilol  6.25 mg Oral BID WC   Chlorhexidine Gluconate Cloth  6 each Topical Q0600   dapagliflozin propanediol  10 mg Oral Daily   digoxin  0.125 mg Oral Daily   feeding supplement  237 mL Oral TID BM   feeding supplement (PROSource TF)  45 mL Per Tube BID   Gerhardt's butt cream   Topical TID   insulin aspart  0-15 Units Subcutaneous TID WC   insulin aspart  0-5 Units Subcutaneous QHS   mouth rinse  15 mL Mouth Rinse BID   pantoprazole  40 mg Oral Q1200   potassium chloride  40 mEq Oral BID   revefenacin  175 mcg Nebulization Daily   sacubitril-valsartan  1 tablet  Oral BID   senna-docusate  1 tablet Oral BID   sodium chloride flush  10-40 mL Intracatheter Q12H   sodium chloride flush  3 mL Intravenous Q12H   sodium chloride flush  3 mL Intravenous Q12H   spironolactone  25 mg Oral Daily   torsemide  20 mg Oral Daily    Infusions:  sodium chloride     feeding supplement (OSMOLITE 1.5 CAL) Stopped (05/29/21 0000)   heparin 1,750 Units/hr (06/01/21 0611)    PRN Medications: sodium chloride, acetaminophen, ipratropium-albuterol, levalbuterol, ondansetron (ZOFRAN) IV, sodium chloride flush, sodium chloride flush    Patient Profile  Ms Haldeman is a 51 year old with a h/o chronic pain, anxiety, depression, and substance abuse.     Admitted with respiratory failure + A flutter and new systolic heart failure.   Echo EF 20-25%, RV severely reduced   Assessment/Plan   1. Acute hypoxemic respiratory failure: Suspect aspiration in setting of drug abuse (benzos/meth on UDS).  Has PNA + pulmonary edema. Sputum with Strep pneumo and S aureus.  Now extubated and off oxygen.  She has diuresed well.  - Off abx.  2. Acute systolic CHF: Nonischemic CMP.  No prior history of CHF.  Echo this admission showed LV EF < 20% with moderate LV dilation, severely decreased RV systolic function, moderate TR, normal IVC.  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 Also consider stress cardiomyopathy from sepsis/PNA.  No significant CAD on 5/18 cath. Cardioverted back to NSR on 5/19.  Off pressors. Wt down w/ diuresis. Creatinine stable at 1.  CVP 3-4, no co-ox today.  - With low CVP will stop torsemide for now.  - digoxin 0.125 mg daily, check level in am.   - Continue spironolactone 25 mg daily.  - Continue Entresto 24/26 bid.  - Continue dapagliflozin 10 mg daily.  - Increase Coreg to 6.25 mg bid.  - If mental status continues to improve (less confused today), will order cardiac MRI tomorrow.   3. Atrial flutter: She  had typical AFL.  AFL has been present since at least 2/23.  Concerned for component of tachy-mediated CMP.  S/p TEE-guided DCCV on 5/19. She is in NSR today with frequent PACs.  - Continue po amiodarone.  - Transition to Eliquis.    - Would ideally have ablation down the road.  4. Substance abuse: UDS with methamphetamines.  5. Hypokalemia: Replace.  6. Confusion: Improving, oriented today.   Mobilize.   Loralie Champagne, 06/01/2021 10:21 AM

## 2021-06-02 ENCOUNTER — Inpatient Hospital Stay (HOSPITAL_COMMUNITY): Payer: Medicaid Other

## 2021-06-02 ENCOUNTER — Other Ambulatory Visit: Payer: Self-pay

## 2021-06-02 DIAGNOSIS — I4892 Unspecified atrial flutter: Secondary | ICD-10-CM | POA: Diagnosis not present

## 2021-06-02 DIAGNOSIS — I5021 Acute systolic (congestive) heart failure: Secondary | ICD-10-CM

## 2021-06-02 DIAGNOSIS — J9602 Acute respiratory failure with hypercapnia: Secondary | ICD-10-CM | POA: Diagnosis not present

## 2021-06-02 DIAGNOSIS — J80 Acute respiratory distress syndrome: Secondary | ICD-10-CM | POA: Diagnosis not present

## 2021-06-02 DIAGNOSIS — I5043 Acute on chronic combined systolic (congestive) and diastolic (congestive) heart failure: Secondary | ICD-10-CM | POA: Diagnosis not present

## 2021-06-02 DIAGNOSIS — G9340 Encephalopathy, unspecified: Secondary | ICD-10-CM | POA: Diagnosis not present

## 2021-06-02 LAB — CBC
HCT: 48.7 % — ABNORMAL HIGH (ref 36.0–46.0)
Hemoglobin: 16.2 g/dL — ABNORMAL HIGH (ref 12.0–15.0)
MCH: 31.5 pg (ref 26.0–34.0)
MCHC: 33.3 g/dL (ref 30.0–36.0)
MCV: 94.7 fL (ref 80.0–100.0)
Platelets: 387 10*3/uL (ref 150–400)
RBC: 5.14 MIL/uL — ABNORMAL HIGH (ref 3.87–5.11)
RDW: 14 % (ref 11.5–15.5)
WBC: 9.7 10*3/uL (ref 4.0–10.5)
nRBC: 0 % (ref 0.0–0.2)

## 2021-06-02 LAB — BASIC METABOLIC PANEL
Anion gap: 6 (ref 5–15)
BUN: 29 mg/dL — ABNORMAL HIGH (ref 6–20)
CO2: 27 mmol/L (ref 22–32)
Calcium: 9.6 mg/dL (ref 8.9–10.3)
Chloride: 110 mmol/L (ref 98–111)
Creatinine, Ser: 0.98 mg/dL (ref 0.44–1.00)
GFR, Estimated: >60 mL/min (ref >60)
Glucose, Bld: 123 mg/dL — ABNORMAL HIGH (ref 70–99)
Potassium: 3.8 mmol/L (ref 3.5–5.1)
Sodium: 143 mmol/L (ref 135–145)

## 2021-06-02 LAB — GLUCOSE, CAPILLARY
Glucose-Capillary: 154 mg/dL — ABNORMAL HIGH (ref 70–99)
Glucose-Capillary: 195 mg/dL — ABNORMAL HIGH (ref 70–99)
Glucose-Capillary: 161 mg/dL — ABNORMAL HIGH (ref 70–99)
Glucose-Capillary: 175 mg/dL — ABNORMAL HIGH (ref 70–99)

## 2021-06-02 LAB — DIGOXIN LEVEL: Digoxin Level: 0.3 ng/mL — ABNORMAL LOW (ref 0.8–2.0)

## 2021-06-02 MED ORDER — GADOBUTROL 1 MMOL/ML IV SOLN
8.0000 mL | Freq: Once | INTRAVENOUS | Status: AC | PRN
  Administered 2021-06-02: 8 mL via INTRAVENOUS

## 2021-06-02 MED ORDER — SACUBITRIL-VALSARTAN 49-51 MG PO TABS
1.0000 | ORAL_TABLET | Freq: Two times a day (BID) | ORAL | Status: DC
Start: 2021-06-02 — End: 2021-06-03
  Administered 2021-06-02 – 2021-06-03 (×3): 1 via ORAL
  Filled 2021-06-02 (×4): qty 1

## 2021-06-02 NOTE — Progress Notes (Addendum)
Patient ID: [name], female   DOB: [birth year], 51 y.o.   MRN: [id number]     Advanced Heart Failure Rounding Note  PCP-Cardiologist:  Dr. Aundra Dubin   Subjective:    TEE (5/19): moderate LV dilation with Ef 20-25%, moderately decreased RV systolic function.    Scr ok, 1.19>>1.29>>1.2>>0.99>>1.07>.0.98  LHC showed no significant coronary disease.   Feels ok. Denies SOB.   Objective:   Weight Range: 87 kg Body mass index is 33.99 kg/m.   Vital Signs:   Temp:  [97.6 F (36.4 C)-97.8 F (36.6 C)] 97.8 F (36.6 C) (05/22 0555) Pulse Rate:  [79-89] 79 (05/22 0555) Resp:  [14-22] 14 (05/22 0555) BP: (113-146)/(63-76) 128/73 (05/22 0555) SpO2:  [94 %-100 %] 94 % (05/22 0555) Weight:  [87 kg] 87 kg (05/22 0555) Last BM Date : 06/01/21  Weight change: Filed Weights   05/31/21 0606 06/01/21 0544 06/02/21 0555  Weight: 89.8 kg 87.1 kg 87 kg    Intake/Output:   Intake/Output Summary (Last 24 hours) at 06/02/2021 0719 Last data filed at 06/01/2021 2228 Gross per 24 hour  Intake 20 ml  Output --  Net 20 ml      Physical Exam   CVP 1 General:  No resp difficulty HEENT: normal Neck: supple. no JVD. Carotids 2+ bilat; no bruits. No lymphadenopathy or thryomegaly appreciated. Cor: PMI nondisplaced. Regular rate & rhythm. No rubs, gallops or murmurs. Lungs: clear Abdomen: soft, nontender, nondistended. No hepatosplenomegaly. No bruits or masses. Good bowel sounds. Extremities: no cyanosis, clubbing, rash, edema. RUE PICC Neuro: alert & orientedx3, cranial nerves grossly intact. moves all 4 extremities w/o difficulty. Affect pleasant   Telemetry   SR 70-80s with PACs.   EKG   N/A  Labs    CBC Recent Labs    06/01/21 0314 06/02/21 0459  WBC 12.3* 9.7  HGB 16.3* 16.2*  HCT 50.0* 48.7*  MCV 95.2 94.7  PLT 384 [id number]   Basic Metabolic Panel Recent Labs    06/01/21 0314 06/02/21 0459  NA 145 143  K 3.8 3.8  CL 108 110  CO2 29 27  GLUCOSE 138* 123*  BUN  30* 29*  CREATININE 1.07* 0.98  CALCIUM 9.8 9.6   Liver Function Tests No results for input(s): AST, ALT, ALKPHOS, BILITOT, PROT, ALBUMIN in the last 72 hours.  No results for input(s): LIPASE, AMYLASE in the last 72 hours. Cardiac Enzymes No results for input(s): CKTOTAL, CKMB, CKMBINDEX, TROPONINI in the last 72 hours.  BNP: BNP (last 3 results) Recent Labs    02/14/21 0742 02/14/21 2153 05/17/21 0415  BNP 309.0* 162.0* 782.6*    ProBNP (last 3 results) No results for input(s): PROBNP in the last 8760 hours.   D-Dimer No results for input(s): DDIMER in the last 72 hours. Hemoglobin A1C No results for input(s): HGBA1C in the last 72 hours. Fasting Lipid Panel No results for input(s): CHOL, HDL, LDLCALC, TRIG, CHOLHDL, LDLDIRECT in the last 72 hours.  Thyroid Function Tests No results for input(s): TSH, T4TOTAL, T3FREE, THYROIDAB in the last 72 hours.  Invalid input(s): FREET3  Other results:   Imaging    No results found.   Medications:     Scheduled Medications:  amiodarone  200 mg Oral BID   apixaban  5 mg Oral BID   arformoterol  15 mcg Nebulization BID   atorvastatin  20 mg Oral Daily   budesonide (PULMICORT) nebulizer solution  0.25 mg Nebulization BID   carvedilol  6.25 mg Oral  BID WC   Chlorhexidine Gluconate Cloth  6 each Topical Q0600   dapagliflozin propanediol  10 mg Oral Daily   digoxin  0.125 mg Oral Daily   feeding supplement  237 mL Oral TID BM   feeding supplement (PROSource TF)  45 mL Per Tube BID   Gerhardt's butt cream   Topical TID   insulin aspart  0-15 Units Subcutaneous TID WC   insulin aspart  0-5 Units Subcutaneous QHS   mouth rinse  15 mL Mouth Rinse BID   pantoprazole  40 mg Oral Q1200   potassium chloride  40 mEq Oral Daily   revefenacin  175 mcg Nebulization Daily   sacubitril-valsartan  1 tablet Oral BID   senna-docusate  1 tablet Oral BID   sodium chloride flush  10-40 mL Intracatheter Q12H   sodium chloride flush   3 mL Intravenous Q12H   sodium chloride flush  3 mL Intravenous Q12H   spironolactone  25 mg Oral Daily    Infusions:  sodium chloride     feeding supplement (OSMOLITE 1.5 CAL) Stopped (05/29/21 0000)    PRN Medications: sodium chloride, acetaminophen, ipratropium-albuterol, levalbuterol, ondansetron (ZOFRAN) IV, sodium chloride flush, sodium chloride flush    Patient Profile  Ms Francesconi is a 51 year old with a h/o chronic pain, anxiety, depression, and substance abuse.     Admitted with respiratory failure + A flutter and new systolic heart failure.   Echo EF 20-25%, RV severely reduced   Assessment/Plan   1. Acute hypoxemic respiratory failure: Suspect aspiration in setting of drug abuse (benzos/meth on UDS).  Has PNA + pulmonary edema. Sputum with Strep pneumo and S aureus.  Now extubated and off oxygen.  She has diuresed well.  - Off abx.  2. Acute systolic CHF: Nonischemic CMP.  No prior history of CHF.  Echo this admission showed LV EF < 20% with moderate LV dilation, severely decreased RV systolic function, moderate TR, normal IVC.  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 Also consider stress cardiomyopathy from sepsis/PNA.  No significant CAD on 5/18 cath. Cardioverted back to NSR on 5/19.  Off pressors.  - CVP 1. No loop diuretics.  - digoxin 0.125 mg daily, dig level 0.3    - Continue spironolactone 25 mg daily.  - Increase entresto 49-51 mg twice a day.   - Continue dapagliflozin 10 mg daily.  - Conitnue Coreg to 6.25 mg bid.  - A&O x3. Will order CMRI.  3. Atrial flutter: She had typical AFL.  AFL has been present since at least 2/23.  Concerned for component of tachy-mediated CMP.  S/p TEE-guided DCCV on 5/19.  - Maintaining SR.   - Continue po amiodarone.  - Continue Eliquis 5 mg bid  - Would ideally have ablation down the road.  4. Substance abuse: UDS with methamphetamines.  5. Hypokalemia: Replace.  6.  Confusion: Improving, oriented today.    Amy Clegg NP-C  06/02/2021 7:19 AM  Patient seen with NP, agree with the above note.   No complaints this morning. She is awake/alert.    CVP 1.   General: NAD Neck: No JVD, no thyromegaly or thyroid nodule.  Lungs: Clear to auscultation bilaterally with normal respiratory effort. CV: Nondisplaced PMI.  Heart regular S1/S2, no S3/S4, no murmur.  No peripheral edema.   Abdomen: Soft, nontender, no hepatosplenomegaly, no distention.  Skin: Intact without lesions or rashes.  Neurologic: Alert and oriented  x 3.  Psych: Normal affect. Extremities: No clubbing or cyanosis.  HEENT: Normal.   Remains in NSR on amiodarone/apixaban.   Will get cardiac MRI today to assess for infiltrative disease/myocarditis.   Increase Entresto to 49/51 bid, does not need loop diuretic.   Mobilize, should be ready soon to leave hospital.   Loralie Champagne 06/02/2021 8:19 AM

## 2021-06-02 NOTE — Progress Notes (Signed)
Mobility Specialist Progress Note    06/02/21 1136  Mobility  Activity Ambulated with assistance in hallway  Level of Assistance Contact guard assist, steadying assist  Assistive Device Front wheel walker  Distance Ambulated (ft) 360 ft  Activity Response Tolerated well  $Mobility charge 1 Mobility   Post-Mobility: 84 HR  Pt received in bed and agreeable. No complaints on walk. Returned to sitting EOB with call bell in reach.    Hildred Alamin Mobility Specialist  Primary: 5N M.S. Phone: [phone] Secondary: 6N M.S. Phone: [phone]

## 2021-06-02 NOTE — Progress Notes (Signed)
Speech Language Pathology Treatment: Dysphagia  Patient Details Name: [name] MRN: [id number] DOB: November 30, 1970 Today's Date: 06/02/2021 Time: 4159-3012 SLP Time Calculation (min) (ACUTE ONLY): 12 min  Assessment / Plan / Recommendation Clinical Impression  F/u to assess swallowing function - pt demonstrates improved overall attention to POs.  Tolerated upgraded solid trials, thin liquids with independence with self feeding, no further s/s of dysphagia.  Breath sounds have been clear since time of initial swallow eval 5/19.  Recommend advancing diet to regular solids (HH/carb modified), thin liquids. Meds may be given whole with water. Dysphagia resolved. SLP will sign off. D/W RN.   HPI HPI: 51yo female admitted 05/16/21 due to unresponsiveness and hypercapnea and hypoxemic respiratory failure, sepsis, aspiration PNA. PMH: anxiety, psychiatric abnormalities, GERD, HTN, chronic laryngitis. Pt intubated 5/5-16/23. Pt with acute biventricular heart failure and AFlutter.      SLP Plan  All goals met      Recommendations for follow up therapy are one component of a multi-disciplinary discharge planning process, led by the attending physician.  Recommendations may be updated based on patient status, additional functional criteria and insurance authorization.    Recommendations  Diet recommendations: Regular;Thin liquid Liquids provided via: Cup;Straw Medication Administration: Whole meds with liquid Supervision: Patient able to self feed Compensations: Minimize environmental distractions Postural Changes and/or Swallow Maneuvers: Seated upright 90 degrees                Oral Care Recommendations: Oral care BID Follow Up Recommendations: No SLP follow up Assistance recommended at discharge: Frequent or constant Supervision/Assistance SLP Visit Diagnosis: Dysphagia, unspecified (R13.10) Plan: All goals met          Kiko Ripp L. Tivis Ringer, [place] Office number [phone] Pager [phone]  [name]  06/02/2021, 4:37 PM

## 2021-06-02 NOTE — Progress Notes (Signed)
Patient refused PIV start, stated" I don't need it and I am going home tomorrow". RN aware and will re consult if pt agrees to the above procedure.

## 2021-06-02 NOTE — Progress Notes (Signed)
Occupational Therapy Treatment Patient Details Name: [name] MRN: [id number] DOB: 1970-08-31 Today's Date: 06/02/2021   History of present illness 51yo female adm 5/5  with sepsis, aspiration PNA and respiratory failure. Intubated 5/5 - 5/16. +amphetamines/benzos. Pt with acute biventricular heart failure and aflutter. Cardiac cath 5/18. TEE/cardioversion 5/19.  PMH - Leiden Factor V, HTN, chronic laryngitis, anxiety, chronic pain, back surgery, neck surgery   OT comments  Patient received in supine and stating she had wet the bed and needs to go to bathroom. Patient was able to get to EOB with min assist and ambulated to bathroom with min guard assist but min assist for toilet transfer for safety with RW. Patient required min assist for clothing management and able to perform toilet hygiene seated. Patient able to donn mesh panties over feet and was min guard to pull up. Patient is making good gains with discharge recommendations changing to HHOT from AIR due to increased mobility and ability to perform self care tasks.    Recommendations for follow up therapy are one component of a multi-disciplinary discharge planning process, led by the attending physician.  Recommendations may be updated based on patient status, additional functional criteria and insurance authorization.    Follow Up Recommendations  Home health OT    Assistance Recommended at Discharge Frequent or constant Supervision/Assistance  Patient can return home with the following  A little help with walking and/or transfers;A little help with bathing/dressing/bathroom;Assistance with cooking/housework;Direct supervision/assist for medications management;Direct supervision/assist for financial management;Assist for transportation   Equipment Recommendations  Tub/shower bench    Recommendations for Other Services      Precautions / Restrictions Precautions Precautions: Fall;Other (comment) Precaution Comments: watch  HR, incontinent Restrictions Weight Bearing Restrictions: No       Mobility Bed Mobility Overal bed mobility: Needs Assistance Bed Mobility: Rolling, Sidelying to Sit, Sit to Supine Rolling: Min assist Sidelying to sit: Min assist   Sit to supine: Min guard   General bed mobility comments: increased time and assistance with scooting forward    Transfers Overall transfer level: Needs assistance Equipment used: Rolling walker (2 wheels) Transfers: Sit to/from Stand, Bed to chair/wheelchair/BSC Sit to Stand: Min guard           General transfer comment: min assist with toilet transfer due to limited space and for safety     Balance Overall balance assessment: Needs assistance Sitting-balance support: Bilateral upper extremity supported, Feet supported Sitting balance-[name] Scale: Good Sitting balance - Comments: static sitting without UE support   Standing balance support: Single extremity supported, Bilateral upper extremity supported Standing balance-[name] Scale: Poor Standing balance comment: able to use BUE to pull up clothing                           ADL either performed or assessed with clinical judgement   ADL Overall ADL's : Needs assistance/impaired     Grooming: Wash/dry hands;Wash/dry face;Min guard;Standing Grooming Details (indicate cue type and reason): at sink             Lower Body Dressing: Minimal assistance;Sit to/from stand Lower Body Dressing Details (indicate cue type and reason): min assist to doff mesh panties and min guard to donn new panties Toilet Transfer: Minimal assistance;Regular Toilet;Rolling walker (2 wheels) Toilet Transfer Details (indicate cue type and reason): min assist for safety with walker Toileting- Clothing Manipulation and Hygiene: Supervision/safety;Sitting/lateral lean Toileting - Clothing Manipulation Details (indicate cue type and reason): performed  toilet hygiene seated and able to manage clothing        General ADL Comments: required frequent cueing to redirect    Extremity/Trunk Assessment              Vision       Perception     Praxis      Cognition Arousal/Alertness: Awake/alert Behavior During Therapy: Flat affect Overall Cognitive Status: Impaired/Different from baseline Area of Impairment: Attention, Following commands, Safety/judgement, Awareness, Problem solving                   Current Attention Level: Selective   Following Commands: Follows one step commands consistently Safety/Judgement: Decreased awareness of deficits, Decreased awareness of safety   Problem Solving: Slow processing General Comments: very talkative about family issues        Exercises      Shoulder Instructions       General Comments      Pertinent Vitals/ Pain       Pain Assessment Pain Assessment: No/denies pain  Home Living                                          Prior Functioning/Environment              Frequency  Min 2X/week        Progress Toward Goals  OT Goals(current goals can now be found in the care plan section)  Progress towards OT goals: Progressing toward goals  Acute Rehab OT Goals Patient Stated Goal: go home OT Goal Formulation: With patient Time For Goal Achievement: 06/11/21 Potential to Achieve Goals: Good ADL Goals Pt Will Perform Eating: with modified independence;sitting Pt Will Perform Grooming: with modified independence;sitting Pt Will Perform Lower Body Bathing: with modified independence;sitting/lateral leans;sit to/from stand Pt Will Perform Lower Body Dressing: with modified independence;sitting/lateral leans;sit to/from stand Pt Will Transfer to Toilet: with modified independence;ambulating Pt Will Perform Toileting - Clothing Manipulation and hygiene: with modified independence;sitting/lateral leans;sit to/from stand Additional ADL Goal #1: Pt will follow 2-3 step commands 85% of session  with no assist.  Plan Discharge plan remains appropriate    Co-evaluation                 AM-PAC OT "6 Clicks" Daily Activity     Outcome Measure   Help from another person eating meals?: None Help from another person taking care of personal grooming?: A Little Help from another person toileting, which includes using toliet, bedpan, or urinal?: A Little Help from another person bathing (including washing, rinsing, drying)?: A Little Help from another person to put on and taking off regular upper body clothing?: A Little Help from another person to put on and taking off regular lower body clothing?: A Little 6 Click Score: 19    End of Session Equipment Utilized During Treatment: Gait belt;Rolling walker (2 wheels)  OT Visit Diagnosis: Unsteadiness on feet (R26.81);Other abnormalities of gait and mobility (R26.89);Muscle weakness (generalized) (M62.81)   Activity Tolerance Patient tolerated treatment well   Patient Left in bed;with call bell/phone within reach;with bed alarm set   Nurse Communication Mobility status        Time: 9476-5465 OT Time Calculation (min): 38 min  Charges: OT General Charges $OT Visit: 1 Visit OT Treatments $Self Care/Home Management : 38-52 mins  Alfonse Flavors, OTA Acute Rehabilitation Services  Pager [phone] Office [phone]  Dewain Penning 06/02/2021, 3:27 PM

## 2021-06-02 NOTE — Progress Notes (Signed)
Inpatient Rehabilitation Admissions Coordinator   I met with patient at bedside and then contacted her daughter by phone. Daughter plans to bring patient home with her to [facility]. Patient mobilizing well and not in need of Cir admit at this time which I told her daughter. Daughter asks that she be given 24 hrs notice of discharge for she has a 3 hr trip from [facility] to come transport her home. I have alerted acute team and TOC. We will sign off at this time.  Danne Baxter, RN, MSN Rehab Admissions Coordinator [phone] 06/02/2021 12:29 PM

## 2021-06-02 NOTE — Progress Notes (Signed)
PROGRESS NOTE    [name]  [id number] DOB: 01/19/1970 DOA: 05/16/2021 PCP: Lemmie Evens, MD   Brief Narrative:  51 year old female with pertinent PMH of anxiety, depression, chronic pain syndrome on multiple psych meds presents to [facility] on 5/5 with acute respiratory failure.  Patient recently admitted 02/14/2021 with similar episode of respiratory failure with sepsis and aspiration pneumonia and patient was intubated.  Patient left AMA on 02/21/2021.  Patient at home takes multiple psych meds: Xanax, amitriptyline, Lyrica, Trintellix, Ambien, citalopram.   5/5 admitted to [facility] intubated for resp failure; transferring to [facility]  5/7 notified of positive blood cultures for night, antibiotic regiment remains seen with broad coverage.  She is more alert and oriented this a.m. 5/8 severe LV and RV dysfunction, LVEF 20%.  Persistent hypoxia and hypercarbia.  5/9 weaned off paralytics and milnirone  5/10 weaned off of epoprostenol.  Vanco stopped.  5/11 weaned off of levophed.  Levaquin  5/12 poor synchrony vent requiring paralytics; Aflutter noted; PICC line placed  5/13 improved ventilator synchrony.  Now 5 L net negative.  Improving oxygenation. 5/15: echo with LVEF 20-25% and RV mildly reduced 5/16: extubated to bipap 5/18: Transition to [facility] - Cardiac cath 5/19: TEE/DCCV 19th - NG incidentally removed - SLP cleared - NG discontinued 5/20: Sundowning overnight requiring Ativan -poorly interactive for morning rounds 5/21: Much more awake alert oriented this morning, flight of ideas/rapid speech ongoing, but appears to be much more coherent than previous. 5/22 Cardiac MRI completed, results pending  Assessment & Plan:   Active Problems:   Respiratory failure with hypoxia and hypercapnia (HCC)   Septic shock (HCC)   Acute encephalopathy   ARDS (adult respiratory distress syndrome) (HCC)   HFrEF (heart failure with reduced ejection fraction) (HCC)   Cor pulmonale, acute  (HCC)  Shock, POA, resolved Septic vs cardiogenic - Off pressors, see below for further treatment details  Cor pulmonale, Acute (HCC) HFrEF (heart failure with reduced ejection fraction) EF 20% - new with severe RV dysfunction Questionably sepsis induced myocardial dysfunction Cardiology following -cardiac catheterization 05/30/2021   Respiratory failure with hypoxia and hypercapnia ([place]), resolving ARDS, resolved Concurrent aspiration pneunomia vs edema, resolved -Extubated previously per PCCM -Wean oxygen as tolerated -currently on room air at rest, ambulatory oxygen screen pending -Completed antibiotics -Continue supportive care -Bipap HS/PRN -questionable underlying obesity hypoventilation syndrome versus sleep apnea, would likely benefit from outpatient sleep study once stabilized   AFib/Flutter  -Resolved, normal sinus rhythm today -Continue p.o. amiodarone -TEE/DCCV 5/19 tolerated well -currently in normal sinus rhythm today -Possible need for ablation in the future -will likely be an outpatient work-up   Acute Metabolic Encephalopathy, resolving Improving, avoid benzodiazepines given mental status over the past 24 hours has been somewhat depressed due to previously administered Ativan  Hypokalemia  -Follow repeat labs  DVT prophylaxis: Heparin drip Code Status: DNR Family Communication: None present  Status is: Inpt  Dispo: The patient is from: Home              Anticipated d/c is to: Home with home health PT              Anticipated d/c date is: 24h              Patient currently NOT medically stable for discharge  Consultants:  PCCM, Cardiology  Subjective: No acute issues/events overnight tolerated procedure this morning quite well denies nausea vomiting diarrhea constipation headache fevers chills or chest pain  Objective: Vitals:   06/01/21  1751 06/01/21 2120 06/02/21 0019 06/02/21 0555  BP:  (!) 146/76 113/75 128/73  Pulse: 89 81 84 79  Resp:  15 20  14   Temp:  97.6 F (36.4 C) 97.7 F (36.5 C) 97.8 F (36.6 C)  TempSrc:  Oral Oral Oral  SpO2:  100%  94%  Weight:    87 kg  Height:        Intake/Output Summary (Last 24 hours) at 06/02/2021 0721 Last data filed at 06/01/2021 2228 Gross per 24 hour  Intake 20 ml  Output --  Net 20 ml    Filed Weights   05/31/21 0606 06/01/21 0544 06/02/21 0555  Weight: 89.8 kg 87.1 kg 87 kg    Examination:  General exam: Appears calm and comfortable  Respiratory system: Clear to auscultation. Respiratory effort normal. Cardiovascular system: Regular rate and rhythm. No JVD, murmurs, rubs, gallops or clicks. No pedal edema. Gastrointestinal system: Abdomen is nondistended, soft and nontender. No organomegaly or masses felt. Normal bowel sounds heard. Central nervous system: Alert and oriented. No focal neurological deficits. Extremities: Symmetric 5 x 5 power. Skin: No rashes, lesions or ulcers  Data Reviewed: I have personally reviewed following labs and imaging studies  CBC: Recent Labs  Lab 05/29/21 0356 05/29/21 0816 05/30/21 0428 05/31/21 0545 06/01/21 0314 06/02/21 0459  WBC 10.0  --  11.1* 16.0* 12.3* 9.7  HGB 14.8 15.3* 15.6* 15.7* 16.3* 16.2*  HCT 43.7 45.0 46.1* 48.1* 50.0* 48.7*  MCV 93.4  --  93.5 95.4 95.2 94.7  PLT 331  --  389 454* 384 [id number]    Basic Metabolic Panel: Recent Labs  Lab 05/28/21 0248 05/28/21 0428 05/29/21 0356 05/29/21 0816 05/29/21 1614 05/30/21 0428 05/31/21 0545 06/01/21 0314 06/02/21 0459  NA 138   < > 140   < > 139 142 143 145 143  K 4.8   < > 2.9*   < > 2.9* 3.3* 3.2* 3.8 3.8  CL 89*   < > 91*  --  97* 99 106 108 110  CO2 35*   < > 36*  --  31 31 26 29 27   GLUCOSE 157*   < > 141*  --  184* 185* 202* 138* 123*  BUN 25*   < > 32*  --  35* 35* 29* 30* 29*  CREATININE 0.99   < > 1.29*  --  1.18* 1.24* 0.99 1.07* 0.98  CALCIUM 9.8   < > 10.1  --  9.8 10.1 9.2 9.8 9.6  MG 2.1  --  2.3  --   --  2.3  --   --   --    < > = values in this  interval not displayed.    GFR: Estimated Creatinine Clearance: 71.8 mL/min (by C-G formula based on SCr of 0.98 mg/dL). Liver Function Tests: Recent Labs  Lab 05/27/21 0306  AST 23  ALT 17  ALKPHOS 149*  BILITOT 0.7  PROT 6.6  ALBUMIN 2.8*    No results for input(s): LIPASE, AMYLASE in the last 168 hours. Recent Labs  Lab 05/29/21 1127  AMMONIA 20    Coagulation Profile: Recent Labs  Lab 05/29/21 1917  INR 1.1    Cardiac Enzymes: No results for input(s): CKTOTAL, CKMB, CKMBINDEX, TROPONINI in the last 168 hours. BNP (last 3 results) No results for input(s): PROBNP in the last 8760 hours. HbA1C: No results for input(s): HGBA1C in the last 72 hours. CBG: Recent Labs  Lab 06/01/21 0120 06/01/21 0731 06/01/21 1155  06/01/21 1630 06/01/21 2141  GLUCAP 156* 169* 153* 150* 130*    Lipid Profile: No results for input(s): CHOL, HDL, LDLCALC, TRIG, CHOLHDL, LDLDIRECT in the last 72 hours. Thyroid Function Tests: No results for input(s): TSH, T4TOTAL, FREET4, T3FREE, THYROIDAB in the last 72 hours. Anemia Panel: No results for input(s): VITAMINB12, FOLATE, FERRITIN, TIBC, IRON, RETICCTPCT in the last 72 hours. Sepsis Labs: No results for input(s): PROCALCITON, LATICACIDVEN in the last 168 hours.  No results found for this or any previous visit (from the past 240 hour(s)).       Radiology Studies: No results found.      Scheduled Meds:  amiodarone  200 mg Oral BID   apixaban  5 mg Oral BID   arformoterol  15 mcg Nebulization BID   atorvastatin  20 mg Oral Daily   budesonide (PULMICORT) nebulizer solution  0.25 mg Nebulization BID   carvedilol  6.25 mg Oral BID WC   Chlorhexidine Gluconate Cloth  6 each Topical Q0600   dapagliflozin propanediol  10 mg Oral Daily   digoxin  0.125 mg Oral Daily   feeding supplement  237 mL Oral TID BM   feeding supplement (PROSource TF)  45 mL Per Tube BID   Gerhardt's butt cream   Topical TID   insulin aspart  0-15  Units Subcutaneous TID WC   insulin aspart  0-5 Units Subcutaneous QHS   mouth rinse  15 mL Mouth Rinse BID   pantoprazole  40 mg Oral Q1200   potassium chloride  40 mEq Oral Daily   revefenacin  175 mcg Nebulization Daily   sacubitril-valsartan  1 tablet Oral BID   senna-docusate  1 tablet Oral BID   sodium chloride flush  10-40 mL Intracatheter Q12H   sodium chloride flush  3 mL Intravenous Q12H   sodium chloride flush  3 mL Intravenous Q12H   spironolactone  25 mg Oral Daily   Continuous Infusions:  sodium chloride     feeding supplement (OSMOLITE 1.5 CAL) Stopped (05/29/21 0000)     LOS: 17 days   Time spent: 76min  Stephone Gum C Antonious Omahoney, DO [facility]  If 7PM-7AM, please contact night-coverage [email or web address]  06/02/2021, 7:21 AM
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
## 2021-06-02 NOTE — TOC Initial Note (Signed)
Transition of Care [facility]) - Initial/Assessment Note    Patient Details  Name: [name] MRN: [id number] Date of Birth: 1970-06-19  Transition of Care [facility]) CM/SW Contact:    Danielle Cousin, RN Phone Number: [phone] 06/02/2021, 2:17 PM  Clinical Narrative:                 HF TOC CM spoke to pt's dtr and she will be going to [facility] to live with dtr. Dtr states he believe she has RW and shower chair at home. Dtr reports has a scale at home. Pt's dtr states she will arrange an appt with her PCP, Danielle Rosaura Carpenter PA in [facility], # [phone], fax # [phone]. Will fax dc summary at dc.  [address] [place] [address]  Will not be able to arrange [facility] due to [facility] Medicaid. Dtr states she will plan to change her Medicaid to [facility] Medicaid program.  Will have meds sent up from [facility] at dc. Message sent to attending.    Expected Discharge Plan: Home/Self Care Barriers to Discharge: Continued Medical Work up   Patient Goals and CMS Choice Patient states their goals for this hospitalization and ongoing recovery are:: wants to get better CMS Medicare.gov Compare Post Acute Care list provided to:: Patient Represenative (must comment) Financial trader)    Expected Discharge Plan and Services Expected Discharge Plan: Home/Self Care   Discharge Planning Services: CM Consult   Living arrangements for the past 2 months: Single Family Home                 DME Arranged: 3-N-1 DME Agency: [employer] Date DME Agency Contacted: 06/02/21 Time DME Agency Contacted: 1412              Prior Living Arrangements/Services Living arrangements for the past 2 months: Single Family Home   Patient language and need for interpreter reviewed:: Yes Do you feel safe going back to the place where you live?: Yes      Need for Family Participation in Patient Care: Yes (Comment) Care giver support system in place?: Yes (comment) Current home services: DME (shower chair) Criminal  Activity/Legal Involvement Pertinent to Current Situation/Hospitalization: No - Comment as needed  Activities of Daily Living      Permission Sought/Granted Permission sought to share information with : Case Manager, Family Supports, PCP Permission granted to share information with : Yes, Verbal Permission Granted  Share Information with NAME: [name]     Permission granted to share info w Relationship: daughter  Permission granted to share info w Contact Information: [phone]  Emotional Assessment           Psych Involvement: No (comment)  Admission diagnosis:  Acute respiratory failure with hypercapnia (HCC) [J96.02] Hypercapnic respiratory failure (HCC) [J96.92] Sepsis, due to unspecified organism, unspecified whether acute organ dysfunction present [facility]) [A41.9] Patient Active Problem List   Diagnosis Date Noted   HFrEF (heart failure with reduced ejection fraction) (HCC) 05/19/2021   Cor pulmonale, acute (HCC) 05/19/2021   ARDS (adult respiratory distress syndrome) (HCC)    Septic shock (HCC)    Acute encephalopathy    Factor V Leiden mutation (HCC) 02/14/2021   Respiratory failure with hypoxia and hypercapnia (HCC) 02/14/2021   Lactic acidosis 02/14/2021   H/O gastroesophageal reflux (GERD) 02/14/2021   Hyperlipidemia 02/14/2021   Generalized weakness 08/31/2016   History of embolectomy of left arm.  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

## 2021-06-03 ENCOUNTER — Other Ambulatory Visit (HOSPITAL_COMMUNITY): Payer: Self-pay

## 2021-06-03 DIAGNOSIS — I483 Typical atrial flutter: Secondary | ICD-10-CM | POA: Diagnosis not present

## 2021-06-03 DIAGNOSIS — I2609 Other pulmonary embolism with acute cor pulmonale: Secondary | ICD-10-CM | POA: Diagnosis not present

## 2021-06-03 DIAGNOSIS — I5020 Unspecified systolic (congestive) heart failure: Secondary | ICD-10-CM

## 2021-06-03 DIAGNOSIS — A419 Sepsis, unspecified organism: Secondary | ICD-10-CM | POA: Diagnosis not present

## 2021-06-03 DIAGNOSIS — G9340 Encephalopathy, unspecified: Secondary | ICD-10-CM | POA: Diagnosis not present

## 2021-06-03 DIAGNOSIS — E875 Hyperkalemia: Secondary | ICD-10-CM | POA: Diagnosis not present

## 2021-06-03 DIAGNOSIS — I5043 Acute on chronic combined systolic (congestive) and diastolic (congestive) heart failure: Secondary | ICD-10-CM | POA: Diagnosis not present

## 2021-06-03 DIAGNOSIS — J9602 Acute respiratory failure with hypercapnia: Secondary | ICD-10-CM | POA: Diagnosis not present

## 2021-06-03 DIAGNOSIS — J9601 Acute respiratory failure with hypoxia: Secondary | ICD-10-CM | POA: Diagnosis not present

## 2021-06-03 DIAGNOSIS — R6521 Severe sepsis with septic shock: Secondary | ICD-10-CM | POA: Diagnosis not present

## 2021-06-03 DIAGNOSIS — J80 Acute respiratory distress syndrome: Secondary | ICD-10-CM | POA: Diagnosis not present

## 2021-06-03 DIAGNOSIS — K7200 Acute and subacute hepatic failure without coma: Secondary | ICD-10-CM | POA: Diagnosis not present

## 2021-06-03 DIAGNOSIS — R6520 Severe sepsis without septic shock: Secondary | ICD-10-CM | POA: Diagnosis not present

## 2021-06-03 LAB — BASIC METABOLIC PANEL
Anion gap: 8 (ref 5–15)
BUN: 30 mg/dL — ABNORMAL HIGH (ref 6–20)
CO2: 22 mmol/L (ref 22–32)
Calcium: 9.5 mg/dL (ref 8.9–10.3)
Chloride: 112 mmol/L — ABNORMAL HIGH (ref 98–111)
Creatinine, Ser: 0.87 mg/dL (ref 0.44–1.00)
GFR, Estimated: >60 mL/min (ref >60)
Glucose, Bld: 125 mg/dL — ABNORMAL HIGH (ref 70–99)
Potassium: 3.3 mmol/L — ABNORMAL LOW (ref 3.5–5.1)
Sodium: 142 mmol/L (ref 135–145)

## 2021-06-03 LAB — CBC
HCT: 47.3 % — ABNORMAL HIGH (ref 36.0–46.0)
Hemoglobin: 16.5 g/dL — ABNORMAL HIGH (ref 12.0–15.0)
MCH: 31.9 pg (ref 26.0–34.0)
MCHC: 34.9 g/dL (ref 30.0–36.0)
MCV: 91.3 fL (ref 80.0–100.0)
Platelets: 341 10*3/uL (ref 150–400)
RBC: 5.18 MIL/uL — ABNORMAL HIGH (ref 3.87–5.11)
RDW: 14.1 % (ref 11.5–15.5)
WBC: 9.4 10*3/uL (ref 4.0–10.5)
nRBC: 0 % (ref 0.0–0.2)

## 2021-06-03 LAB — GLUCOSE, CAPILLARY
Glucose-Capillary: 153 mg/dL — ABNORMAL HIGH (ref 70–99)
Glucose-Capillary: 131 mg/dL — ABNORMAL HIGH (ref 70–99)

## 2021-06-03 MED ORDER — POTASSIUM CHLORIDE CRYS ER 20 MEQ PO TBCR
40.0000 meq | EXTENDED_RELEASE_TABLET | Freq: Two times a day (BID) | ORAL | 0 refills | Status: DC
  Filled 2021-06-03: qty 120, 30d supply, fill #0

## 2021-06-03 MED ORDER — POTASSIUM CHLORIDE CRYS ER 20 MEQ PO TBCR
40.0000 meq | EXTENDED_RELEASE_TABLET | Freq: Every day | ORAL | 0 refills | Status: DC
  Filled 2021-06-03: qty 60, 30d supply, fill #0

## 2021-06-03 MED ORDER — BUDESONIDE 0.25 MG/2ML IN SUSP
0.2500 mg | Freq: Two times a day (BID) | RESPIRATORY_TRACT | 12 refills | Status: DC
  Filled 2021-06-03: qty 60, 15d supply, fill #0

## 2021-06-03 MED ORDER — DIGOXIN 125 MCG PO TABS
0.1250 mg | ORAL_TABLET | Freq: Every day | ORAL | 0 refills | Status: DC
  Filled 2021-06-03: qty 30, 30d supply, fill #0

## 2021-06-03 MED ORDER — SACUBITRIL-VALSARTAN 49-51 MG PO TABS
1.0000 | ORAL_TABLET | Freq: Two times a day (BID) | ORAL | 0 refills | Status: DC
  Filled 2021-06-03: qty 60, 30d supply, fill #0

## 2021-06-03 MED ORDER — SPIRONOLACTONE 25 MG PO TABS
25.0000 mg | ORAL_TABLET | Freq: Every day | ORAL | 0 refills | Status: DC
Start: 2021-06-03 — End: 2021-06-11
  Filled 2021-06-03: qty 30, 30d supply, fill #0

## 2021-06-03 MED ORDER — IPRATROPIUM-ALBUTEROL 0.5-2.5 (3) MG/3ML IN SOLN
3.0000 mL | Freq: Four times a day (QID) | RESPIRATORY_TRACT | 0 refills | Status: DC | PRN
  Filled 2021-06-03: qty 180, 15d supply, fill #0

## 2021-06-03 MED ORDER — REVEFENACIN 175 MCG/3ML IN SOLN
175.0000 ug | Freq: Every day | RESPIRATORY_TRACT | 0 refills | Status: AC
  Filled 2021-06-03: qty 90, 30d supply, fill #0

## 2021-06-03 MED ORDER — AMIODARONE HCL 200 MG PO TABS: 200.0000 mg | ORAL_TABLET | Freq: Every day | ORAL | 3 refills | Status: DC

## 2021-06-03 MED ORDER — ARFORMOTEROL TARTRATE 15 MCG/2ML IN NEBU
15.0000 ug | INHALATION_SOLUTION | Freq: Two times a day (BID) | RESPIRATORY_TRACT | 0 refills | Status: DC
  Filled 2021-06-03: qty 120, 30d supply, fill #0

## 2021-06-03 MED ORDER — CARVEDILOL 6.25 MG PO TABS
6.2500 mg | ORAL_TABLET | Freq: Two times a day (BID) | ORAL | 0 refills | Status: DC
  Filled 2021-06-03: qty 60, 30d supply, fill #0

## 2021-06-03 MED ORDER — DAPAGLIFLOZIN PROPANEDIOL 10 MG PO TABS
10.0000 mg | ORAL_TABLET | Freq: Every day | ORAL | 0 refills | Status: DC
  Filled 2021-06-03: qty 30, 30d supply, fill #0

## 2021-06-03 MED ORDER — AMIODARONE HCL 200 MG PO TABS
200.0000 mg | ORAL_TABLET | Freq: Two times a day (BID) | ORAL | 0 refills | Status: DC
  Filled 2021-06-03: qty 60, 30d supply, fill #0

## 2021-06-03 MED ORDER — APIXABAN 5 MG PO TABS
5.0000 mg | ORAL_TABLET | Freq: Two times a day (BID) | ORAL | 0 refills | Status: DC
  Filled 2021-06-03: qty 60, 30d supply, fill #0

## 2021-06-03 NOTE — TOC CM/SW Note (Addendum)
HF TOC CM ordered nebulizer machine to delivery to room. TOC CM updated Unit RN. Dtr is coming and will provide transportation home. Medications reviewed and meds will come up from [facility] pharmacy. Meds covered under her medicaid with copay. Isidoro Donning RN3 CCM, Heart Failure TOC CM [phone]

## 2021-06-03 NOTE — Discharge Summary (Signed)
Physician Discharge Summary  [name] [id number] DOB: [birth year] DOA: 05/16/2021  PCP: Lemmie Evens, MD  Admit date: 05/16/2021 Discharge date: 06/03/2021  Admitted From: Home Disposition: Home  Recommendations for Outpatient Follow-up:  Follow up with PCP in 1-2 weeks Follow-up with cardiology as scheduled  Home Health: PT/OT Equipment/Devices: 3in1, nebulizer  Discharge Condition: Stable CODE STATUS: Full Diet recommendation: Low-salt low-fat fluid restricted diet as discussed  Brief/Interim Summary: 51 year old female with pertinent PMH of anxiety, depression, chronic pain syndrome on multiple psych meds presents to [facility] on 5/5 with acute respiratory failure.  Patient recently admitted 02/14/2021 with similar episode of respiratory failure with sepsis and aspiration pneumonia and patient was intubated.  Patient left AMA on 02/21/2021.  Patient at home takes multiple psych meds: Xanax, amitriptyline, Lyrica, Trintellix, Ambien, citalopram.   5/5 admitted to [facility] intubated for resp failure; transferring to [facility]  5/7 notified of positive blood cultures for night, antibiotic regiment remains seen with broad coverage.  She is more alert and oriented this a.m. 5/8 severe LV and RV dysfunction, LVEF 20%.  Persistent hypoxia and hypercarbia.  5/9 weaned off paralytics and milnirone  5/10 weaned off of epoprostenol.  Vanco stopped.  5/11 weaned off of levophed.  Levaquin  5/12 poor synchrony vent requiring paralytics; Aflutter noted; PICC line placed  5/13 improved ventilator synchrony.  Now 5 L net negative.  Improving oxygenation. 5/15: echo with LVEF 20-25% and RV mildly reduced 5/16: extubated to bipap 5/18: Transition to [facility] - Cardiac cath 5/19: TEE/DCCV 19th - NG incidentally removed - SLP cleared - NG discontinued 5/20: Sundowning overnight requiring Ativan -poorly interactive for morning rounds 5/21: Much more awake alert oriented this morning, flight of ideas/rapid speech  ongoing, but appears to be much more coherent than previous. 5/22 Cardiac MRI completed, results pending  Discharge Diagnoses:  Active Problems:   Respiratory failure with hypoxia and hypercapnia (HCC)   Septic shock (HCC)   Acute encephalopathy   ARDS (adult respiratory distress syndrome) (HCC)   HFrEF (heart failure with reduced ejection fraction) (HCC)   Cor pulmonale, acute (HCC)  Shock, POA, resolved Septic vs cardiogenic - Off pressors, see below for further treatment details   Cor pulmonale, Acute (HCC) HFrEF (heart failure with reduced ejection fraction) EF 20% - new with severe RV dysfunction Questionably sepsis induced myocardial dysfunction Cardiology following -cardiac catheterization 05/30/2021 Continue medical management, LifeVest and further evaluation as well as repeat echocardiogram in the outpatient setting per cardiology   Respiratory failure with hypoxia and hypercapnia ([place]), resolving ARDS, resolved Concurrent aspiration pneunomia vs edema, resolved -Extubated previously per PCCM -Completed antibiotics, no oxygen requirements with exertion -Continue supportive care -Discharged with nebulizer and inhaled medications per PCCM as below   AFib/Flutter  -Resolved, normal sinus rhythm -Continue p.o. amiodarone -TEE/DCCV 5/19 tolerated well -currently in normal sinus rhythm today -Possible need for ablation in the future -will likely be an outpatient work-up   Acute metabolic versus toxic encephalopathy, resolved Improving, avoid benzodiazepines given mental status over the past 24 hours has been somewhat depressed due to previously administered Ativan   Hypokalemia  -Follow repeat labs  Discharge Instructions  Discharge Instructions     Discharge patient   Complete by: As directed    Discharge disposition: 06-Home-Health Care Svc   Discharge patient date: 06/03/2021      Allergies as of 06/03/2021       Reactions   Penicillins Anaphylaxis   Has  patient had a PCN reaction causing immediate rash, facial/tongue/throat  swelling, SOB or lightheadedness with hypotension: No Has patient had a PCN reaction causing severe rash involving mucus membranes or skin necrosis: No Has patient had a PCN reaction that required hospitalization No Has patient had a PCN reaction occurring within the last 10 years: No If all of the above answers are "NO", then may proceed with Cephalosporin use.   Ibuprofen Nausea And Vomiting   GI upset, Shakes   Peppermint Flavor [flavoring Agent]    Allergic to pepper the spice   Aspirin Nausea And Vomiting   Gi upset        Medication List     STOP taking these medications    ALPRAZolam 0.5 MG tablet Commonly known as: XANAX   ALPRAZolam 1 MG tablet Commonly known as: XANAX   amitriptyline 25 MG tablet Commonly known as: ELAVIL   amitriptyline 50 MG tablet Commonly known as: ELAVIL   aspirin-sod bicarb-citric acid 325 MG Tbef tablet Commonly known as: ALKA-SELTZER   citalopram 20 MG tablet Commonly known as: CELEXA   hydrochlorothiazide 12.5 MG tablet Commonly known as: HYDRODIURIL   ketoconazole 2 % shampoo Commonly known as: NIZORAL   lisinopril 20 MG tablet Commonly known as: ZESTRIL   methocarbamol 750 MG tablet Commonly known as: ROBAXIN   pregabalin 200 MG capsule Commonly known as: LYRICA   Trintellix 10 MG Tabs tablet Generic drug: vortioxetine HBr   Vyvanse 50 MG capsule Generic drug: lisdexamfetamine   zolpidem 10 MG tablet Commonly known as: AMBIEN       TAKE these medications    albuterol 108 (90 Base) MCG/ACT inhaler Commonly known as: VENTOLIN HFA Inhale 2 puffs into the lungs every 4 (four) hours as needed for wheezing or shortness of breath.   amiodarone 200 MG tablet Commonly known as: PACERONE Take 1 tablet (200 mg total) by mouth daily.   aspirin EC 81 MG tablet Take 1 tablet (81 mg total) by mouth daily.   atorvastatin 20 MG tablet Commonly  known as: LIPITOR Take 20 mg by mouth daily.   bismuth subsalicylate 99991111 MG chewable tablet Commonly known as: PEPTO BISMOL Chew 524 mg by mouth 3 (three) times daily as needed for indigestion.   budesonide 0.25 MG/2ML nebulizer solution Commonly known as: PULMICORT Take 2 mLs (0.25 mg total) by nebulization 2 (two) times daily.   carvedilol 6.25 MG tablet Commonly known as: COREG Take 1 tablet (6.25 mg total) by mouth 2 (two) times daily with a meal.   celecoxib 200 MG capsule Commonly known as: CELEBREX Take 200 mg by mouth 2 (two) times daily.   digoxin 0.125 MG tablet Commonly known as: LANOXIN Take 1 tablet (0.125 mg total) by mouth daily.   Eliquis 5 MG Tabs tablet Generic drug: apixaban Take 1 tablet (5 mg total) by mouth 2 (two) times daily.   Entresto 49-51 MG Generic drug: sacubitril-valsartan Take 1 tablet by mouth 2 (two) times daily.   Farxiga 10 MG Tabs tablet Generic drug: dapagliflozin propanediol Take 1 tablet (10 mg total) by mouth daily.   fluticasone 50 MCG/ACT nasal spray Commonly known as: FLONASE Place 1 spray into both nostrils at bedtime.   ipratropium-albuterol 0.5-2.5 (3) MG/3ML Soln Commonly known as: DUONEB Take 3 mLs by nebulization every 6 (six) hours as needed.   magnesium oxide 400 MG tablet Commonly known as: MAG-OX Take 400 mg by mouth daily.   NON FORMULARY NERVIVE (nerve relief)   ondansetron 4 MG disintegrating tablet Commonly known as: Zofran ODT Take 1 tablet (  4 mg total) by mouth every 8 (eight) hours as needed for nausea or vomiting.   pantoprazole 40 MG tablet Commonly known as: PROTONIX Take 40 mg by mouth daily.   potassium chloride SA 20 MEQ tablet Commonly known as: KLOR-CON M Take 2 tablets (40 mEq total) by mouth 2 (two) times daily. What changed:  how much to take when to take this   spironolactone 25 MG tablet Commonly known as: ALDACTONE Take 1 tablet (25 mg total) by mouth daily.   sucralfate 1 g  tablet Commonly known as: Carafate Take 1 tablet (1 g total) by mouth 2 (two) times daily.   Vitamin D (Ergocalciferol) 1.25 MG (50000 UNIT) Caps capsule Commonly known as: DRISDOL Take 50,000 Units by mouth 2 (two) times a week.   Yupelri 175 MCG/3ML nebulizer solution Generic drug: revefenacin Take 3 mLs (175 mcg total) by nebulization daily. Start taking on: Jun 04, 2021               Durable Medical Equipment  (From admission, onward)           Start     Ordered   06/03/21 0951  For home use only DME Nebulizer machine  Once       Question Answer Comment  Patient needs a nebulizer to treat with the following condition ARDS (adult respiratory distress syndrome) ([place])   Patient needs a nebulizer to treat with the following condition Heart failure [facility])   Patient needs a nebulizer to treat with the following condition Cor pulmonale, acute ([place])   Length of Need Lifetime      06/03/21 0954   06/02/21 1411  For home use only DME 4 wheeled rolling walker with seat  Once       Question:  Patient needs a walker to treat with the following condition  Answer:  Heart failure ([place])   06/02/21 1410            Follow-up Information     [facility] Follow up on 06/11/2021.   Specialty: Cardiology Why: Bring all medicaitons. At [address] [place]. Contact information: [id number] [place] [address] [phone]               Allergies  Allergen Reactions   Penicillins Anaphylaxis    Has patient had a PCN reaction causing immediate rash, facial/tongue/throat swelling, SOB or lightheadedness with hypotension: No Has patient had a PCN reaction causing severe rash involving mucus membranes or skin necrosis: No Has patient had a PCN reaction that required hospitalization No Has patient had a PCN reaction occurring within the last 10 years: No If all of the above answers are  "NO", then may proceed with Cephalosporin use.    Ibuprofen Nausea And Vomiting    GI upset, Shakes   Peppermint Flavor [Flavoring Agent]     Allergic to pepper the spice   Aspirin Nausea And Vomiting    Gi upset    Procedures/Studies: CT Head Wo Contrast  Result Date: 05/16/2021 CLINICAL DATA:  Trauma. EXAM: CT HEAD WITHOUT CONTRAST CT CERVICAL SPINE WITHOUT CONTRAST TECHNIQUE: Multidetector CT imaging of the head and cervical spine was performed following the standard protocol without intravenous contrast. Multiplanar CT image reconstructions of the cervical spine were also generated. RADIATION DOSE REDUCTION: This exam was performed according to the departmental dose-optimization program which includes automated exposure control, adjustment of the mA and/or kV according to patient size  and/or use of iterative reconstruction technique. COMPARISON:  CT dated 09/05/2010. FINDINGS: CT HEAD FINDINGS Brain: The ventricles and sulci appropriate size for patient's age. The gray-white matter discrimination is preserved. There is no acute intracranial hemorrhage. No mass effect or midline shift. No extra-axial fluid collection. Vascular: No hyperdense vessel or unexpected calcification. Skull: Normal. Negative for fracture or focal lesion. Sinuses/Orbits: Mild mucoperiosteal thickening of paranasal sinuses. No air-fluid level. The mastoid air cells are clear. Other: None CT CERVICAL SPINE FINDINGS Alignment: No acute subluxation. Skull base and vertebrae: No acute fracture. Soft tissues and spinal canal: No prevertebral fluid or swelling. No visible canal hematoma. Disc levels: C4-C5 and C5-C6 fusion. There is anterior fusion and posterior screws at C4-C5. The hardware appears intact. Upper chest: Partial consolidative changes of the posterior right lung as well as patchy area of streaky and nodular density in the visualized left lung. Findings likely represent multifocal pneumonia or aspiration. Chest CT may  provide better evaluation. Other: An endotracheal and enteric tubes are partially visualized. IMPRESSION: 1. No acute intracranial pathology. 2. No acute fracture or subluxation of the cervical spine. 3. Multifocal pneumonia or aspiration. Chest CT may provide better evaluation. Electronically Signed   By: Anner Crete M.D.   On: 05/16/2021 22:15   CT CERVICAL SPINE WO CONTRAST  Result Date: 05/16/2021 CLINICAL DATA:  Trauma. EXAM: CT HEAD WITHOUT CONTRAST CT CERVICAL SPINE WITHOUT CONTRAST TECHNIQUE: Multidetector CT imaging of the head and cervical spine was performed following the standard protocol without intravenous contrast. Multiplanar CT image reconstructions of the cervical spine were also generated. RADIATION DOSE REDUCTION: This exam was performed according to the departmental dose-optimization program which includes automated exposure control, adjustment of the mA and/or kV according to patient size and/or use of iterative reconstruction technique. COMPARISON:  CT dated 09/05/2010. FINDINGS: CT HEAD FINDINGS Brain: The ventricles and sulci appropriate size for patient's age. The gray-white matter discrimination is preserved. There is no acute intracranial hemorrhage. No mass effect or midline shift. No extra-axial fluid collection. Vascular: No hyperdense vessel or unexpected calcification. Skull: Normal. Negative for fracture or focal lesion. Sinuses/Orbits: Mild mucoperiosteal thickening of paranasal sinuses. No air-fluid level. The mastoid air cells are clear. Other: None CT CERVICAL SPINE FINDINGS Alignment: No acute subluxation. Skull base and vertebrae: No acute fracture. Soft tissues and spinal canal: No prevertebral fluid or swelling. No visible canal hematoma. Disc levels: C4-C5 and C5-C6 fusion. There is anterior fusion and posterior screws at C4-C5. The hardware appears intact. Upper chest: Partial consolidative changes of the posterior right lung as well as patchy area of streaky and  nodular density in the visualized left lung. Findings likely represent multifocal pneumonia or aspiration. Chest CT may provide better evaluation. Other: An endotracheal and enteric tubes are partially visualized. IMPRESSION: 1. No acute intracranial pathology. 2. No acute fracture or subluxation of the cervical spine. 3. Multifocal pneumonia or aspiration. Chest CT may provide better evaluation. Electronically Signed   By: Anner Crete M.D.   On: 05/16/2021 22:15   CARDIAC CATHETERIZATION  Result Date: 05/29/2021 1. Normal LVEDP with RA pressure elevated to 12 mmHg (measured by PICC). 2. Elevated cardiac output (from PICC). 3. Angiographically normal coronaries. 20 cc contrast used.   DG CHEST PORT 1 VIEW  Result Date: 05/27/2021 CLINICAL DATA:  Hypoxia with respiratory failure. EXAM: PORTABLE CHEST 1 VIEW COMPARISON:  05/24/2021. FINDINGS: ETT tip is 4.5 cm from the carina. Feeding tube enters the stomach but the intragastric course not evaluated. Right PICC  tip is in the distal SVC. The heart enlarged. Mediastinal configuration is unaltered. Central vessels are mildly prominent without overt findings of edema. There are small pleural effusions and patchy consolidation or atelectasis in both lower lung fields. The mid and upper lung fields remain clear. Overall aeration seems unchanged. Mild thoracic dextroscoliosis. IMPRESSION: Small pleural effusions with patchy consolidation or atelectasis in both lower lung fields, consolidation favored. Overall aeration is unchanged. Support devices as above. Electronically Signed   By: Telford Nab M.D.   On: 05/27/2021 07:21   DG CHEST PORT 1 VIEW  Result Date: 05/24/2021 CLINICAL DATA:  Follow-up respiratory failure EXAM: PORTABLE CHEST 1 VIEW COMPARISON:  Yesterday FINDINGS: Endotracheal tube with tip at the clavicular heads. Right PICC with tip near the SVC origin. A feeding tube at least reaches the stomach. Cardiopericardial enlargement. Hazy  opacification of the bilateral lower chest. No visible pneumothorax. IMPRESSION: 1. Right PICC with tip near the brachiocephalic SVC origin. Pre-existing hardware is stable. 2. Atelectasis or infiltrate at the bases with pleural fluid. Electronically Signed   By: Jorje Guild M.D.   On: 05/24/2021 08:07   DG CHEST PORT 1 VIEW  Result Date: 05/23/2021 CLINICAL DATA:  Respiratory failure. EXAM: PORTABLE CHEST 1 VIEW COMPARISON:  Chest x-ray May 22, 2021. FINDINGS: Similar probable layering bilateral pleural effusions with mild improvement in bilateral basilar predominant airspace opacities. No visible pneumothorax. Gastric tube courses below the diaphragm and outside the field of view. Enlarged cardiomediastinal silhouette. Endotracheal tube tip is approximately 4 cm above the carina. IMPRESSION: 1. Similar suspected layering bilateral pleural effusions with mild improvement in overlying airspace opacities that could represent edema and/or pneumonia. 2. Cardiomegaly. Electronically Signed   By: Margaretha Sheffield M.D.   On: 05/23/2021 08:17   DG CHEST PORT 1 VIEW  Result Date: 05/22/2021 CLINICAL DATA:  Respiratory failure EXAM: PORTABLE CHEST 1 VIEW COMPARISON:  05/18/2021 FINDINGS: Support devices are unchanged. Cardiomegaly. Diffuse bilateral airspace disease, worsening since prior study. Possible layering effusions. No acute bony abnormality. IMPRESSION: Worsening diffuse bilateral airspace disease which could reflect edema or infection. Suspect layering bilateral effusions. Electronically Signed   By: Rolm Baptise M.D.   On: 05/22/2021 20:44   DG CHEST PORT 1 VIEW  Result Date: 05/18/2021 CLINICAL DATA:  Ventilator dependent respiratory failure. EXAM: PORTABLE CHEST 1 VIEW COMPARISON:  Portable chest yesterday at 1:50 p.m. FINDINGS: 3:31 a.m., 05/18/2021. ETT tip is 5 cm from the carina. NGT enters the stomach with the full intragastric course not evaluated. Stable moderate cardiomegaly. There is  persistent perihilar vascular congestion with slight central edema. There are moderate pleural effusions in the right-greater-than-left with adjacent consolidation or atelectasis in the mid to lower lung fields. The upper lung fields remain clear. Overall aeration is not significantly changed. IMPRESSION: 1. Stable cardiomegaly, perihilar vascular congestion and slight central interstitial edema. 2. Right-greater-than-left pleural effusions and overlying lung opacities consistent with atelectasis or consolidation. No new or worsened abnormality. Electronically Signed   By: Telford Nab M.D.   On: 05/18/2021 03:52   DG CHEST PORT 1 VIEW  Result Date: 05/17/2021 CLINICAL DATA:  Respiratory failure.  Unresponsive. EXAM: PORTABLE CHEST 1 VIEW COMPARISON:  Radiograph earlier today. FINDINGS: The endotracheal tube tip is approximately 5.2 cm from the carina. Enteric tube in place with tip and side-port below the diaphragm. Similar cardiomegaly. Mild vascular congestion suspected. Retrocardiac opacity may represent combination of airspace disease and pleural fluid. There is a right pleural effusion with patchy opacity throughout the  right lung. No pneumothorax. IMPRESSION: 1. Endotracheal tube tip approximately 5.2 cm from the carina. Enteric tube in place. 2. Cardiomegaly with vascular congestion. 3. Dense retrocardiac opacity likely combination of pleural effusion and airspace disease. There is also right pleural effusion and patchy opacity throughout the right lung. Electronically Signed   By: Keith Rake M.D.   On: 05/17/2021 15:44   DG Chest Port 1 View  Result Date: 05/17/2021 CLINICAL DATA:  Intubation and central line placement. EXAM: PORTABLE CHEST 1 VIEW COMPARISON:  Earlier radiograph dated 05/16/2021. FINDINGS: Endotracheal tube with tip approximately 4 cm above the carina. Enteric tube extends below the diaphragm with side-port in the proximal stomach and tip beyond the inferior margin of the  image. There is cardiomegaly with vascular congestion and probable mild edema. Trace bilateral pleural effusions may be present. No pneumothorax. No acute osseous pathology. IMPRESSION: 1. Endotracheal tube above the carina. 2. Enteric tube with tip beyond the inferior margin of the image. 3. Cardiomegaly with vascular congestion and mild edema. Electronically Signed   By: Anner Crete M.D.   On: 05/17/2021 01:32   DG Chest Port 1 View  Result Date: 05/16/2021 CLINICAL DATA:  Intubated EXAM: PORTABLE CHEST 1 VIEW COMPARISON:  02/17/2021, 02/15/2021 FINDINGS: Endotracheal tube tip is about 3.6 cm superior to the carina. Cardiomegaly with mild central congestion. Esophageal tube tip below the diaphragm but incompletely visualized. No consolidation, pleural effusion or pneumothorax. IMPRESSION: 1. Endotracheal tube tip about 3.6 cm superior to carina 2. Cardiomegaly with central congestion Electronically Signed   By: Donavan Foil M.D.   On: 05/16/2021 21:48   DG Abd Portable 1V  Result Date: 05/28/2021 CLINICAL DATA:  Feeding tube placement EXAM: PORTABLE ABDOMEN - 1 VIEW COMPARISON:  05/19/2021 FINDINGS: Tip of feeding tube is seen in the region of distal antrum of the stomach. Bowel gas pattern is nonspecific. Pelvis is not included in the image. There is improvement in aeration of lower lung fields, possibly suggesting decrease in pleural effusions and infiltrates. There is residual increased density in the left lower lung fields partly obscuring the left hemidiaphragm. Surgical clips are seen in the right side of abdomen. There is surgical fusion in the lower lumbar spine. IMPRESSION: Tip of enteric tube is seen in the antrum of the stomach. Electronically Signed   By: Elmer Picker M.D.   On: 05/28/2021 14:28   DG Abd Portable 1V  Result Date: 05/19/2021 CLINICAL DATA:  Feeding tube placement EXAM: PORTABLE ABDOMEN - 1 VIEW COMPARISON:  Previous studies including the examination of 02/17/2021  FINDINGS: Tip of enteric tube is noted in the region of fourth portion of duodenum. Bowel gas pattern is nonspecific. Increased density in both lower lung fields may suggest pleural effusions and possibly underlying infiltrates. There is surgical fusion in the lower lumbar spine. IMPRESSION: Tip of enteric tube is seen in the region of fourth portion of duodenum. Bowel gas pattern is nonspecific. Increased density in the lower lung fields may suggest bilateral pleural effusions and possibly underlying infiltrates. Electronically Signed   By: Elmer Picker M.D.   On: 05/19/2021 11:38   MR CARDIAC MORPHOLOGY W WO CONTRAST  Result Date: 06/03/2021 CLINICAL DATA:  Nonischemic cardiomyopathy EXAM: CARDIAC MRI TECHNIQUE: The patient was scanned on a 1.5 Tesla GE magnet. A dedicated cardiac coil was used. Functional imaging was done using Fiesta sequences. 2,3, and 4 chamber views were done to assess for RWMA's. Modified Simpson's rule using a short axis stack was used to  calculate an ejection fraction on a dedicated work Conservation officer, nature. The patient received 9 cc of Gadavist. After 10 minutes inversion recovery sequences were used to assess for infiltration and scar tissue. CONTRAST:  Gadavist 9 cc FINDINGS: Poor images due to difficulty with breath-holding. Limited images of the lung fields showed no gross abnormalities. Mildly dilated left ventricle with normal wall thickness. Diffuse LV hypokinesis, EF 29%. Normal right ventricular size with RVEF 32%. Mild left atrial enlargement. Normal right atrium. Trileaflet aortic valve, no stenosis or regurgitation. No significant mitral regurgitation noted visually though images were poor. Low quality delayed enhancement images, but no definitive late gadolinium enhancement (LGE) was noted. MEASUREMENTS: MEASUREMENTS LVEDV 224 mL LVEDVi 114 mL/m2 LVSV 65 mL LVEF 29% RVEDV 176 mL RVEDVi 61 mL/m2 RVSV 56 mL RVEF 32% IMPRESSION: 1.  Technically difficult  study. 2.  Mildly dilated LV with EF 29%, diffuse hypokinesis. 3.  Normal RV size with EF 32%. 4. Low quality delayed enhancement images, but no definite LGE was visualized. No definitive evidence for prior MI, infiltrative disease, or myocarditis. Dalton Mclean Electronically Signed   By: Loralie Champagne M.D.   On: 06/03/2021 10:18   ECHOCARDIOGRAM COMPLETE  Result Date: 05/18/2021    ECHOCARDIOGRAM REPORT   Patient Name:   MARCELLE DURAND Date of Exam: 05/18/2021 Medical Rec #:  [id number]        Height:       63.0 in Accession #:    [id number]       Weight:       207.2 lb Date of Birth:  1970/04/29         BSA:          1.963 m Patient Age:    7 years         BP:           110/80 mmHg Patient Gender: F                HR:           118 bpm. Exam Location:  Inpatient Procedure: 2D Echo, Color Doppler, Cardiac Doppler and Intracardiac            Opacification Agent Indications:    R06.9 DOE  History:        Patient has prior history of Echocardiogram examinations, most                 recent 12/07/2015. Risk Factors:Hypertension, Dyslipidemia and                 Polysub.  Sonographer:    Raquel Sarna Senior RDCS Referring Phys: [phone] WHITNEY D HARRIS  Sonographer Comments: Technically difficult due to patient body habitus, scanned supine on artificial respirator. IMPRESSIONS  1. Left ventricular ejection fraction, by estimation, is <20%. The left ventricle has severely decreased function. The left ventricle has no regional wall motion abnormalities. The left ventricular internal cavity size was moderately dilated. Left ventricular diastolic parameters are indeterminate.  2. Right ventricular systolic function is severely reduced. The right ventricular size is mildly enlarged. There is normal pulmonary artery systolic pressure.  3. Left atrial size was severely dilated.  4. Right atrial size was severely dilated.  5. The mitral valve is normal in structure. Mild mitral valve regurgitation. No evidence of mitral stenosis.   6. Tricuspid valve regurgitation is moderate.  7. The aortic valve is tricuspid. Aortic valve regurgitation is not visualized. No aortic stenosis is present.  8. The inferior  vena cava is dilated in size with <50% respiratory variability, suggesting right atrial pressure of 15 mmHg. Comparison(s): Prior images unable to be directly viewed. FINDINGS  Left Ventricle: Left ventricular ejection fraction, by estimation, is <20%. The left ventricle has severely decreased function. The left ventricle has no regional wall motion abnormalities. Definity contrast agent was given IV to delineate the left ventricular endocardial borders. The left ventricular internal cavity size was moderately dilated. There is no left ventricular hypertrophy. Left ventricular diastolic parameters are indeterminate. Right Ventricle: The right ventricular size is mildly enlarged. Right ventricular systolic function is severely reduced. There is normal pulmonary artery systolic pressure. The tricuspid regurgitant velocity is 1.84 m/s, and with an assumed right atrial pressure of 15 mmHg, the estimated right ventricular systolic pressure is [id number] mmHg. Left Atrium: Left atrial size was severely dilated. Right Atrium: Right atrial size was severely dilated. Pericardium: There is no evidence of pericardial effusion. Mitral Valve: The mitral valve is normal in structure. Mild mitral valve regurgitation. No evidence of mitral valve stenosis. Tricuspid Valve: The tricuspid valve is normal in structure. Tricuspid valve regurgitation is moderate . No evidence of tricuspid stenosis. Aortic Valve: The aortic valve is tricuspid. Aortic valve regurgitation is not visualized. No aortic stenosis is present. Pulmonic Valve: The pulmonic valve was normal in structure. Pulmonic valve regurgitation is trivial. No evidence of pulmonic stenosis. Aorta: The aortic root is normal in size and structure. Venous: The inferior vena cava is dilated in size with less than  50% respiratory variability, suggesting right atrial pressure of 15 mmHg. IAS/Shunts: No atrial level shunt detected by color flow Doppler.  LEFT VENTRICLE PLAX 2D LVIDd:         6.00 cm LVIDs:         5.00 cm LV PW:         0.90 cm LV IVS:        0.80 cm LVOT diam:     2.10 cm LV SV:         15 LV SV Index:   8 LVOT Area:     3.46 cm  RIGHT VENTRICLE RV S prime:     6.68 cm/s TAPSE (M-mode): 1.5 cm LEFT ATRIUM             Index        RIGHT ATRIUM           Index LA diam:        4.20 cm 2.14 cm/m   RA Area:     25.50 cm LA Vol (A2C):   91.0 ml 46.36 ml/m  RA Volume:   91.30 ml  46.51 ml/m LA Vol (A4C):   90.7 ml 46.20 ml/m LA Biplane Vol: 91.9 ml 46.81 ml/m  AORTIC VALVE LVOT Vmax:   43.10 cm/s LVOT Vmean:  31.300 cm/s LVOT VTI:    0.044 m  AORTA Ao Root diam: 3.10 cm Ao Asc diam:  3.40 cm MITRAL VALVE               TRICUSPID VALVE MV Area (PHT): 6.43 cm    TR Peak grad:   13.5 mmHg MV Decel Time: 118 msec    TR Vmax:        184.00 cm/s MV E velocity: 84.00 cm/s                            SHUNTS  Systemic VTI:  0.04 m                            Systemic Diam: 2.10 cm Kirk Ruths MD Electronically signed by Kirk Ruths MD Signature Date/Time: 05/18/2021/10:35:15 AM    Final    VAS [place] LOWER EXTREMITY VENOUS (DVT)  Result Date: 05/19/2021  Lower Venous DVT Study Patient Name:  AIYAHNA FARQUHAR  Date of Exam:   05/19/2021 Medical Rec #: [id number]         Accession #:    [id number] Date of Birth: Nov 28, 1970          Patient Gender: F Patient Age:   67 years Exam Location:  [facility] Procedure:      VAS [place] LOWER EXTREMITY VENOUS (DVT) Referring Phys: Kipp Brood --------------------------------------------------------------------------------  Indications: Factor V Leiden, and Edema.  Limitations: Body habitus and poor ultrasound/tissue interface. Comparison Study: No prior study Performing Technologist: Sharion Dove RVS  Examination Guidelines: A complete evaluation  includes B-mode imaging, spectral Doppler, color Doppler, and power Doppler as needed of all accessible portions of each vessel. Bilateral testing is considered an integral part of a complete examination. Limited examinations for reoccurring indications may be performed as noted. The reflux portion of the exam is performed with the patient in reverse Trendelenburg.  +---------+---------------+---------+-----------+----------+--------------+ RIGHT    CompressibilityPhasicitySpontaneityPropertiesThrombus Aging +---------+---------------+---------+-----------+----------+--------------+ CFV                     Yes      Yes                                 +---------+---------------+---------+-----------+----------+--------------+ FV Prox                 Yes      Yes                                 +---------+---------------+---------+-----------+----------+--------------+ FV Mid                  Yes      Yes                                 +---------+---------------+---------+-----------+----------+--------------+ FV Distal               Yes      Yes                                 +---------+---------------+---------+-----------+----------+--------------+ POP                     Yes      Yes                                 +---------+---------------+---------+-----------+----------+--------------+   Right Technical Findings: Not visualized segments include PFV, PTV, peroneal veins.  +---------+---------------+---------+-----------+----------+--------------+ LEFT     CompressibilityPhasicitySpontaneityPropertiesThrombus Aging +---------+---------------+---------+-----------+----------+--------------+ CFV      Full           Yes      Yes                                 +---------+---------------+---------+-----------+----------+--------------+  FV Mid                           Yes                                  +---------+---------------+---------+-----------+----------+--------------+ FV Distal                        Yes                                 +---------+---------------+---------+-----------+----------+--------------+ POP      Full                    Yes                                 +---------+---------------+---------+-----------+----------+--------------+ PTV      Full                    Yes                                 +---------+---------------+---------+-----------+----------+--------------+ PERO     Full                    Yes                                 +---------+---------------+---------+-----------+----------+--------------+     Summary: RIGHT: - There is no evidence of deep vein thrombosis in the lower extremity. However, portions of this examination were limited- see technologist comments above.  LEFT: - There is no evidence of deep vein thrombosis in the lower extremity. However, portions of this examination were limited- see technologist comments above.  *See table(s) above for measurements and observations. Electronically signed by Servando Snare MD on 05/19/2021 at 7:51:49 PM.    Final    ECHOCARDIOGRAM LIMITED  Result Date: 05/26/2021    ECHOCARDIOGRAM LIMITED REPORT   Patient Name:   KAYLENE SHADDOCK Date of Exam: 05/26/2021 Medical Rec #:  [id number]        Height:       63.0 in Accession #:    [id number]       Weight:       242.1 lb Date of Birth:  1970-07-12         BSA:          2.097 m Patient Age:    17 years         BP:           115/73 mmHg Patient Gender: F                HR:           124 bpm. Exam Location:  Inpatient Procedure: 2D Echo, Limited Echo, Cardiac Doppler, Color Doppler and            Intracardiac Opacification Agent Indications:    Respiratory distress  History:        Patient has prior history of Echocardiogram examinations. Risk  Factors:Hypertension.  Sonographer:    Jyl Heinz Referring Phys: [id number] [place]  1. Left ventricular ejection fraction, by estimation, is 20 to 25%. The left ventricle has severely decreased function. The left ventricle demonstrates global hypokinesis. The left ventricular internal cavity size was mildly dilated. Left ventricular diastolic parameters are indeterminate.  2. Right ventricular systolic function is mildly reduced. The right ventricular size is mildly enlarged. There is normal pulmonary artery systolic pressure.  3. Left atrial size was severely dilated.  4. Right atrial size was severely dilated.  5. A small pericardial effusion is present. The pericardial effusion is posterior to the left ventricle.  6. The mitral valve is normal in structure. No evidence of mitral valve regurgitation. No evidence of mitral stenosis.  7. Tricuspid valve regurgitation is mild to moderate.  8. The aortic valve is tricuspid. Aortic valve regurgitation is not visualized. No aortic stenosis is present.  9. The inferior vena cava is dilated in size with <50% respiratory variability, suggesting right atrial pressure of 15 mmHg. FINDINGS  Left Ventricle: Left ventricular ejection fraction, by estimation, is 20 to 25%. The left ventricle has severely decreased function. The left ventricle demonstrates global hypokinesis. Definity contrast agent was given IV to delineate the left ventricular endocardial borders. The left ventricular internal cavity size was mildly dilated. There is no left ventricular hypertrophy of the inferior segment. Left ventricular diastolic parameters are indeterminate. Right Ventricle: The right ventricular size is mildly enlarged. No increase in right ventricular wall thickness. Right ventricular systolic function is mildly reduced. There is normal pulmonary artery systolic pressure. The tricuspid regurgitant velocity  is 2.00 m/s, and with an assumed right atrial pressure of 15 mmHg, the estimated right ventricular systolic pressure is 0000000 mmHg. Left Atrium: Left  atrial size was severely dilated. Right Atrium: Right atrial size was severely dilated. Pericardium: A small pericardial effusion is present. The pericardial effusion is posterior to the left ventricle. Mitral Valve: The mitral valve is normal in structure. No evidence of mitral valve stenosis. Tricuspid Valve: The tricuspid valve is normal in structure. Tricuspid valve regurgitation is mild to moderate. No evidence of tricuspid stenosis. Aortic Valve: The aortic valve is tricuspid. Aortic valve regurgitation is not visualized. No aortic stenosis is present. Aortic valve peak gradient measures 4.0 mmHg. Pulmonic Valve: The pulmonic valve was normal in structure. Pulmonic valve regurgitation is trivial. No evidence of pulmonic stenosis. Aorta: The aortic root is normal in size and structure. Venous: The inferior vena cava is dilated in size with less than 50% respiratory variability, suggesting right atrial pressure of 15 mmHg. IAS/Shunts: No atrial level shunt detected by color flow Doppler. LEFT VENTRICLE PLAX 2D LVIDd:         5.80 cm      Diastology LVIDs:         5.00 cm      LV e' medial:    7.62 cm/s LV PW:         1.30 cm      LV E/e' medial:  11.0 LV IVS:        0.80 cm      LV e' lateral:   6.20 cm/s LVOT diam:     2.00 cm      LV E/e' lateral: 13.5 LV SV:         29 LV SV Index:   14 LVOT Area:     3.14 cm  LV Volumes (MOD) LV vol d, MOD A2C: 126.0 ml  LV vol d, MOD A4C: 141.0 ml LV vol s, MOD A2C: 99.9 ml LV vol s, MOD A4C: 109.0 ml LV SV MOD A2C:     26.1 ml LV SV MOD A4C:     141.0 ml LV SV MOD BP:      29.6 ml RIGHT VENTRICLE            IVC RV S prime:     9.79 cm/s  IVC diam: 2.20 cm TAPSE (M-mode): 1.4 cm LEFT ATRIUM         Index LA diam:    3.80 cm 1.81 cm/m  AORTIC VALVE AV Area (Vmax): 2.48 cm AV Vmax:        100.00 cm/s AV Peak Grad:   4.0 mmHg LVOT Vmax:      79.00 cm/s LVOT Vmean:     62.200 cm/s LVOT VTI:       0.093 m  AORTA Ao Root diam: 3.10 cm Ao Asc diam:  3.10 cm MITRAL VALVE                TRICUSPID VALVE MV Area (PHT): 9.14 cm    TR Peak grad:   16.0 mmHg MV Decel Time: 83 msec     TR Vmax:        200.00 cm/s MV E velocity: 83.80 cm/s                            SHUNTS                            Systemic VTI:  0.09 m                            Systemic Diam: 2.00 cm Kardie Tobb DO Electronically signed by Thomasene Ripple DO Signature Date/Time: 05/26/2021/4:51:43 PM    Final    [place] EKG SITE RITE  Result Date: 05/22/2021 If Site Rite image not attached, placement could not be confirmed due to current cardiac rhythm.    Subjective: No acute issues or events overnight denies nausea vomiting diarrhea constipation headache fevers chills or chest pain   Discharge Exam: Vitals:   06/03/21 0937 06/03/21 0943  BP:  (!) 154/77  Pulse:  83  Resp: 20 20  Temp: 97.6 F (36.4 C) 97.7 F (36.5 C)  SpO2: 96% 96%   Vitals:   06/03/21 0841 06/03/21 0843 06/03/21 0937 06/03/21 0943  BP:    (!) 154/77  Pulse:    83  Resp:   20 20  Temp:   97.6 F (36.4 C) 97.7 F (36.5 C)  TempSrc:   Oral Oral  SpO2: 94% 94% 96% 96%  Weight:      Height:        General: Pt is alert, awake, not in acute distress Cardiovascular: RRR, S1/S2 +, no rubs, no gallops Respiratory: CTA bilaterally, no wheezing, no rhonchi Abdominal: Soft, NT, ND, bowel sounds + Extremities: no edema, no cyanosis    The results of significant diagnostics from this hospitalization (including imaging, microbiology, ancillary and laboratory) are listed below for reference.     Microbiology: No results found for this or any previous visit (from the past 240 hour(s)).   Labs: BNP (last 3 results) Recent Labs    02/14/21 0742 02/14/21 2153 05/17/21 0415  BNP 309.0* 162.0* 782.6*   Basic Metabolic Panel: Recent  Labs  Lab 05/28/21 0248 05/28/21 0428 05/29/21 0356 05/29/21 0816 05/30/21 0428 05/31/21 0545 06/01/21 0314 06/02/21 0459 06/03/21 0352  NA 138   < > 140   < > 142 143 145 143 142  K 4.8   < >  2.9*   < > 3.3* 3.2* 3.8 3.8 3.3*  CL 89*   < > 91*   < > 99 106 108 110 112*  CO2 35*   < > 36*   < > 31 26 29 27 22   GLUCOSE 157*   < > 141*   < > 185* 202* 138* 123* 125*  BUN 25*   < > 32*   < > 35* 29* 30* 29* 30*  CREATININE 0.99   < > 1.29*   < > 1.24* 0.99 1.07* 0.98 0.87  CALCIUM 9.8   < > 10.1   < > 10.1 9.2 9.8 9.6 9.5  MG 2.1  --  2.3  --  2.3  --   --   --   --    < > = values in this interval not displayed.   Liver Function Tests: No results for input(s): AST, ALT, ALKPHOS, BILITOT, PROT, ALBUMIN in the last 168 hours. No results for input(s): LIPASE, AMYLASE in the last 168 hours. Recent Labs  Lab 05/29/21 1127  AMMONIA 20   CBC: Recent Labs  Lab 05/30/21 0428 05/31/21 0545 06/01/21 0314 06/02/21 0459 06/03/21 0352  WBC 11.1* 16.0* 12.3* 9.7 9.4  HGB 15.6* 15.7* 16.3* 16.2* 16.5*  HCT 46.1* 48.1* 50.0* 48.7* 47.3*  MCV 93.5 95.4 95.2 94.7 91.3  PLT 389 454* 384 387 341   Cardiac Enzymes: No results for input(s): CKTOTAL, CKMB, CKMBINDEX, TROPONINI in the last 168 hours. BNP: Invalid input(s): POCBNP CBG: Recent Labs  Lab 06/02/21 1158 06/02/21 1732 06/02/21 2124 06/03/21 0816 06/03/21 1234  GLUCAP 195* 161* 175* 153* 131*   D-Dimer No results for input(s): DDIMER in the last 72 hours. Hgb A1c No results for input(s): HGBA1C in the last 72 hours. Lipid Profile No results for input(s): CHOL, HDL, LDLCALC, TRIG, CHOLHDL, LDLDIRECT in the last 72 hours. Thyroid function studies No results for input(s): TSH, T4TOTAL, T3FREE, THYROIDAB in the last 72 hours.  Invalid input(s): FREET3 Anemia work up No results for input(s): VITAMINB12, FOLATE, FERRITIN, TIBC, IRON, RETICCTPCT in the last 72 hours. Urinalysis    Component Value Date/Time   COLORURINE AMBER (A) 05/16/2021 2130   APPEARANCEUR CLEAR 05/16/2021 2130   LABSPEC 1.024 05/16/2021 2130   PHURINE 5.0 05/16/2021 2130   GLUCOSEU NEGATIVE 05/16/2021 2130   HGBUR NEGATIVE 05/16/2021 2130    BILIRUBINUR NEGATIVE 05/16/2021 2130   KETONESUR NEGATIVE 05/16/2021 2130   PROTEINUR 100 (A) 05/16/2021 2130   UROBILINOGEN 0.2 01/10/2014 2312   NITRITE NEGATIVE 05/16/2021 2130   LEUKOCYTESUR NEGATIVE 05/16/2021 2130   Sepsis Labs Invalid input(s): PROCALCITONIN,  WBC,  LACTICIDVEN Microbiology No results found for this or any previous visit (from the past 240 hour(s)).   Time coordinating discharge: Over 30 minutes  SIGNED:   Little Ishikawa, DO [facility] 06/03/2021, 6:59 PM Pager   If 7PM-7AM, please contact night-coverage [email or web address]

## 2021-06-03 NOTE — Progress Notes (Addendum)
Patient ID: [name], female   DOB: 14-Nov-1970, 51 y.o.   MRN: [id number]     Advanced Heart Failure Rounding Note  PCP-Cardiologist:  Dr. Aundra Dubin   Subjective:    TEE (5/19): moderate LV dilation with Ef 20-25%, moderately decreased RV systolic function.    LHC showed no significant coronary disease.   CMRI poor images.    PICC out yesterday. Denies SOB. Wants to go home.      Objective:   Weight Range: 91.3 kg Body mass index is 35.66 kg/m.   Vital Signs:   Temp:  [97.6 F (36.4 C)-98.7 F (37.1 C)] 98.7 F (37.1 C) (05/23 0444) Pulse Rate:  [53-91] 91 (05/23 0838) Resp:  [15-18] 18 (05/23 0838) BP: (117-158)/(68-132) 117/68 (05/23 0444) SpO2:  [94 %-96 %] 94 % (05/23 0843) Weight:  [91.3 kg] 91.3 kg (05/23 0444) Last BM Date : 06/02/21  Weight change: Filed Weights   06/01/21 0544 06/02/21 0555 06/03/21 0444  Weight: 87.1 kg 87 kg 91.3 kg    Intake/Output:   Intake/Output Summary (Last 24 hours) at 06/03/2021 0904 Last data filed at 06/02/2021 1200 Gross per 24 hour  Intake 480 ml  Output --  Net 480 ml      Physical Exam   General:  Well appearing. No resp difficulty. Sitting on the side of the bed.  HEENT: normal Neck: supple. no JVD. Carotids 2+ bilat; no bruits. No lymphadenopathy or thryomegaly appreciated. Cor: PMI nondisplaced. Regular rate & rhythm. No rubs, gallops or murmurs. Lungs: clear Abdomen: soft, nontender, nondistended. No hepatosplenomegaly. No bruits or masses. Good bowel sounds. Extremities: no cyanosis, clubbing, rash, edema Neuro: alert & orientedx3, cranial nerves grossly intact. moves all 4 extremities w/o difficulty. Affect pleasant   Telemetry  SR-ST 80-100s  EKG   N/A  Labs    CBC Recent Labs    06/02/21 0459 06/03/21 0352  WBC 9.7 9.4  HGB 16.2* 16.5*  HCT 48.7* 47.3*  MCV 94.7 91.3  PLT 387 [id number]   Basic Metabolic Panel Recent Labs    06/02/21 0459 06/03/21 0352  NA 143 142  K 3.8 3.3*  CL  110 112*  CO2 27 22  GLUCOSE 123* 125*  BUN 29* 30*  CREATININE 0.98 0.87  CALCIUM 9.6 9.5   Liver Function Tests No results for input(s): AST, ALT, ALKPHOS, BILITOT, PROT, ALBUMIN in the last 72 hours.  No results for input(s): LIPASE, AMYLASE in the last 72 hours. Cardiac Enzymes No results for input(s): CKTOTAL, CKMB, CKMBINDEX, TROPONINI in the last 72 hours.  BNP: BNP (last 3 results) Recent Labs    02/14/21 0742 02/14/21 2153 05/17/21 0415  BNP 309.0* 162.0* 782.6*    ProBNP (last 3 results) No results for input(s): PROBNP in the last 8760 hours.   D-Dimer No results for input(s): DDIMER in the last 72 hours. Hemoglobin A1C No results for input(s): HGBA1C in the last 72 hours. Fasting Lipid Panel No results for input(s): CHOL, HDL, LDLCALC, TRIG, CHOLHDL, LDLDIRECT in the last 72 hours.  Thyroid Function Tests No results for input(s): TSH, T4TOTAL, T3FREE, THYROIDAB in the last 72 hours.  Invalid input(s): FREET3  Other results:   Imaging    No results found.   Medications:     Scheduled Medications:  amiodarone  200 mg Oral BID   apixaban  5 mg Oral BID   arformoterol  15 mcg Nebulization BID   atorvastatin  20 mg Oral Daily   budesonide (PULMICORT) nebulizer solution  0.25 mg Nebulization BID   carvedilol  6.25 mg Oral BID WC   dapagliflozin propanediol  10 mg Oral Daily   digoxin  0.125 mg Oral Daily   feeding supplement  237 mL Oral TID BM   feeding supplement (PROSource TF)  45 mL Per Tube BID   Gerhardt's butt cream   Topical TID   insulin aspart  0-15 Units Subcutaneous TID WC   insulin aspart  0-5 Units Subcutaneous QHS   mouth rinse  15 mL Mouth Rinse BID   pantoprazole  40 mg Oral Q1200   potassium chloride  40 mEq Oral Daily   revefenacin  175 mcg Nebulization Daily   sacubitril-valsartan  1 tablet Oral BID   senna-docusate  1 tablet Oral BID   sodium chloride flush  10-40 mL Intracatheter Q12H   sodium chloride flush  3 mL  Intravenous Q12H   sodium chloride flush  3 mL Intravenous Q12H   spironolactone  25 mg Oral Daily    Infusions:  sodium chloride     feeding supplement (OSMOLITE 1.5 CAL) Stopped (05/29/21 0000)    PRN Medications: sodium chloride, acetaminophen, ipratropium-albuterol, levalbuterol, ondansetron (ZOFRAN) IV, sodium chloride flush, sodium chloride flush    Patient Profile  [name] is a 51 year old with a h/o chronic pain, anxiety, depression, and substance abuse.     Admitted with respiratory failure + A flutter and new systolic heart failure.   Echo EF 20-25%, RV severely reduced   Assessment/Plan   1. Acute hypoxemic respiratory failure: Suspect aspiration in setting of drug abuse (benzos/meth on UDS).  Has PNA + pulmonary edema. Sputum with Strep pneumo and S aureus.  Now extubated and off oxygen.  She has diuresed well.  - Off abx.  2. Acute systolic CHF: Nonischemic CMP.  No prior history of CHF.  Echo this admission showed LV EF < 20% with moderate LV dilation, severely decreased RV systolic function, moderate TR, normal IVC.  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 Also consider stress cardiomyopathy from sepsis/PNA.  No significant CAD on 5/18 cath. Cardioverted back to NSR on 5/19.  Off pressors.  - digoxin 0.125 mg daily, dig level 0.3    - Continue spironolactone 25 mg daily.  - Continue entresto 49-51 mg twice a day.   - Continue dapagliflozin 10 mg daily.  - Continue Coreg to 6.25 mg bid.  - Renal function stable. Supp K  3. Atrial flutter: She had typical AFL.  AFL has been present since at least 2/23.  Concerned for component of tachy-mediated CMP.  S/p TEE-guided DCCV on 5/19.  - Maintaining SR.   - Continue po amiodarone.  - Continue Eliquis 5 mg bid  - Would ideally have atrial fibrillation ablation down the road.  4. Substance abuse: UDS with methamphetamines.  5. Hypokalemia: Replace.  6. Confusion:  Resolved.   Can go home today.  Will need f/u early next week in the HF clinic. Needs to establish with cardiologist in [facility]  HF meds for d/c - Will need all meds sent  Amiodarone 200 mg daily Apixaban 5 mg twice a day  Atorvastatin 20 mg daily  Carvedilol 6.25 mg twice a day  Farxiga 10 mg daily Digoxin 0.125 mg daily  Entresto 49-51 mg twice a day Kdur 40 meq twice a day  Spironolactone 25 mg daily.   Discussed with Dr Natale Milch.   Amy Clegg NP-C  06/03/2021  9:04 AM  Patient seen with NP, agree with the above note.   Stable today, remains in NSR.    General: NAD Neck: No JVD, no thyromegaly or thyroid nodule.  Lungs: Clear to auscultation bilaterally with normal respiratory effort. CV: Nondisplaced PMI.  Heart regular S1/S2, no S3/S4, no murmur.  No peripheral edema.   Abdomen: Soft, nontender, no hepatosplenomegaly, no distention.  Skin: Intact without lesions or rashes.  Neurologic: Alert and oriented x 3.  Psych: Normal affect. Extremities: No clubbing or cyanosis.  HEENT: Normal.   Stable BP, not volume overloaded on exam, in NSR.  I think she can go home today on the above medication regimen.  She is going to need close cardiology followup.  Will arrange appointment next week in CHF clinic.  Ultimately plans to move the [facility] with daughter, will need to establish cardiology followup there.   Loralie Champagne 06/03/2021 9:57 AM

## 2021-06-03 NOTE — Progress Notes (Signed)
Pt continues to refuse respiratory treatments and CPAP.

## 2021-06-03 NOTE — Progress Notes (Signed)
Discussed with pt and daughter HF booklet including daily wts, low sodium, signs of fluid, and smoking cessation. Pt tangential but voiced understanding.  [id number] [name] CES, ACSM 12:06 PM 06/03/2021

## 2021-06-03 NOTE — TOC Benefit Eligibility Note (Signed)
Patient Advocate Encounter  Prior Authorization for Brovana 15MCG/2ML nebulizer solution has been approved.    PA# [id number] Effective dates: 06/03/2021 through 06/03/2022      Roland Earl, CPhT Pharmacy Patient Advocate Specialist [facility] Patient Advocate Team Direct Number: [phone]  Fax: [phone]

## 2021-06-03 NOTE — TOC Benefit Eligibility Note (Signed)
Patient Advocate Encounter  Prior Authorization for Yupelri 175MCG/3ML solution has been approved.    PA# [id number] Effective dates: 06/03/2021 through 06/03/2022      Roland Earl, CPhT Pharmacy Patient Advocate Specialist [facility] Patient Advocate Team Direct Number: [phone]  Fax: [phone]

## 2021-06-03 NOTE — Progress Notes (Signed)
Completed D/C paperwork and IV was taken out by Retta Mac RN. Spoke with daughter Joice Lofts who is in patient's room. Cardiac rehab needs to educate before discharging. Patient's RN was notified. Printed DC summary was put in patient's chart.

## 2021-06-10 NOTE — Progress Notes (Signed)
PCP: Primary Cardiologist:  HPI: [name] is a 51 year old with a h/o chronic pain, anxiety, depression, substance abuse, and HFrEF.     Admitted with respiratory failure + A flutter and new systolic heart failure. Echo EF 20-25%, RV severely reduced.  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 Also consider stress cardiomyopathy from sepsis/PNA.  No significant CAD on 5/18 cath. Cardioverted back to NSR on 5/19.    Today she returns for post hospital HF follow up.Overall feeling fine. Denies SOB/PND/Orthopnea. Appetite ok. No fever or chills. Weight at home  pounds. Taking all medications.  Cardiac Testing  05/2021 Echo: moderate LV dilation with Ef 20-25%, moderately decreased RV systolic function.    05/2021 LHC - no significant CAD  05/30/21 DC-CV --> NSR    ROS: All systems negative except as listed in HPI, PMH and Problem List.  SH:  Social History   Socioeconomic History   Marital status: Divorced    Spouse name: Not on file   Number of children: Not on file   Years of education: Not on file   Highest education level: Not on file  Occupational History   Not on file  Tobacco Use   Smoking status: Every Day    Packs/day: 1.50    Years: 25.00    Pack years: 37.50    Types: Cigarettes   Smokeless tobacco: Never  Substance and Sexual Activity   Alcohol use: No   Drug use: No   Sexual activity: Yes    Birth control/protection: Surgical  Other Topics Concern   Not on file  Social History Narrative   Not on file   Social Determinants of Health   Financial Resource Strain: Not on file  Food Insecurity: Not on file  Transportation Needs: Not on file  Physical Activity: Not on file  Stress: Not on file  Social Connections: Not on file  Intimate Partner Violence: Not on file    FH:  Family History  Problem Relation Age of Onset   Heart disease Other    Arthritis Other    Lung disease Other    Cancer Other     Asthma Other    Diabetes Other     Past Medical History:  Diagnosis Date   Anxiety    Back pain    Chronic back pain 02/04/13   By patient report   Depression    Factor 5 Leiden mutation, heterozygous (HCC)    Hypertension    Laryngitis, chronic    Panic attack    Scoliosis    Ulcer     Current Outpatient Medications  Medication Sig Dispense Refill   albuterol (PROVENTIL HFA;VENTOLIN HFA) 108 (90 BASE) MCG/ACT inhaler Inhale 2 puffs into the lungs every 4 (four) hours as needed for wheezing or shortness of breath. 1 Inhaler 0   amiodarone (PACERONE) 200 MG tablet Take 1 tablet (200 mg total) by mouth daily. 30 tablet 3   apixaban (ELIQUIS) 5 MG TABS tablet Take 1 tablet (5 mg total) by mouth 2 (two) times daily. 60 tablet 0   aspirin EC 81 MG EC tablet Take 1 tablet (81 mg total) by mouth daily.     atorvastatin (LIPITOR) 20 MG tablet Take 20 mg by mouth daily.     bismuth subsalicylate (PEPTO BISMOL) 262 MG chewable tablet Chew 524 mg by mouth 3 (three) times daily as needed for indigestion.     budesonide (PULMICORT)  0.25 MG/2ML nebulizer solution Take 2 mLs (0.25 mg total) by nebulization 2 (two) times daily. 60 mL 12   carvedilol (COREG) 6.25 MG tablet Take 1 tablet (6.25 mg total) by mouth 2 (two) times daily with a meal. 60 tablet 0   celecoxib (CELEBREX) 200 MG capsule Take 200 mg by mouth 2 (two) times daily.     dapagliflozin propanediol (FARXIGA) 10 MG TABS tablet Take 1 tablet (10 mg total) by mouth daily. 30 tablet 0   digoxin (LANOXIN) 0.125 MG tablet Take 1 tablet (0.125 mg total) by mouth daily. 30 tablet 0   fluticasone (FLONASE) 50 MCG/ACT nasal spray Place 1 spray into both nostrils at bedtime.     ipratropium-albuterol (DUONEB) 0.5-2.5 (3) MG/3ML SOLN Take 3 mLs by nebulization every 6 (six) hours as needed. 360 mL 0   magnesium oxide (MAG-OX) 400 MG tablet Take 400 mg by mouth daily.     NON FORMULARY NERVIVE (nerve relief)     ondansetron (ZOFRAN ODT) 4 MG  disintegrating tablet Take 1 tablet (4 mg total) by mouth every 8 (eight) hours as needed for nausea or vomiting. 20 tablet 0   pantoprazole (PROTONIX) 40 MG tablet Take 40 mg by mouth daily.     potassium chloride SA (KLOR-CON M) 20 MEQ tablet Take 2 tablets (40 mEq total) by mouth 2 (two) times daily. 120 tablet 0   revefenacin (YUPELRI) 175 MCG/3ML nebulizer solution Take 3 mLs (175 mcg total) by nebulization daily. 90 mL 0   sacubitril-valsartan (ENTRESTO) 49-51 MG Take 1 tablet by mouth 2 (two) times daily. 60 tablet 0   spironolactone (ALDACTONE) 25 MG tablet Take 1 tablet (25 mg total) by mouth daily. 30 tablet 0   sucralfate (CARAFATE) 1 g tablet Take 1 tablet (1 g total) by mouth 2 (two) times daily. 30 tablet 0   Vitamin D, Ergocalciferol, (DRISDOL) 1.25 MG (50000 UNIT) CAPS capsule Take 50,000 Units by mouth 2 (two) times a week.     No current facility-administered medications for this visit.    There were no vitals filed for this visit.  PHYSICAL EXAM:  General:  Well appearing. No resp difficulty HEENT: normal Neck: supple. JVP flat. Carotids 2+ bilaterally; no bruits. No lymphadenopathy or thryomegaly appreciated. Cor: PMI normal. Regular rate & rhythm. No rubs, gallops or murmurs. Lungs: clear Abdomen: soft, nontender, nondistended. No hepatosplenomegaly. No bruits or masses. Good bowel sounds. Extremities: no cyanosis, clubbing, rash, edema Neuro: alert & orientedx3, cranial nerves grossly intact. Moves all 4 extremities w/o difficulty. Affect pleasant.   ECG:   ASSESSMENT & PLAN: 1. HFrEF    2. PAF

## 2021-06-11 ENCOUNTER — Encounter (HOSPITAL_COMMUNITY): Payer: Self-pay

## 2021-06-11 ENCOUNTER — Ambulatory Visit (HOSPITAL_COMMUNITY)
Admit: 2021-06-11 | Discharge: 2021-06-11 | Disposition: A | Discharge status: Home / Self Care | Payer: Medicaid Other | Source: Ambulatory Visit | Attending: Adult Health | Admitting: Adult Health

## 2021-06-11 VITALS — BP 118/80 | HR 79 | Wt 209.0 lb

## 2021-06-11 DIAGNOSIS — Z7901 Long term (current) use of anticoagulants: Secondary | ICD-10-CM | POA: Diagnosis not present

## 2021-06-11 DIAGNOSIS — I5020 Unspecified systolic (congestive) heart failure: Secondary | ICD-10-CM | POA: Diagnosis not present

## 2021-06-11 DIAGNOSIS — Z720 Tobacco use: Secondary | ICD-10-CM

## 2021-06-11 DIAGNOSIS — I428 Other cardiomyopathies: Secondary | ICD-10-CM | POA: Insufficient documentation

## 2021-06-11 DIAGNOSIS — Z79899 Other long term (current) drug therapy: Secondary | ICD-10-CM | POA: Insufficient documentation

## 2021-06-11 DIAGNOSIS — I4892 Unspecified atrial flutter: Secondary | ICD-10-CM | POA: Diagnosis not present

## 2021-06-11 DIAGNOSIS — I5022 Chronic systolic (congestive) heart failure: Secondary | ICD-10-CM | POA: Insufficient documentation

## 2021-06-11 DIAGNOSIS — I480 Paroxysmal atrial fibrillation: Secondary | ICD-10-CM

## 2021-06-11 DIAGNOSIS — G8929 Other chronic pain: Secondary | ICD-10-CM | POA: Diagnosis not present

## 2021-06-11 DIAGNOSIS — F17210 Nicotine dependence, cigarettes, uncomplicated: Secondary | ICD-10-CM | POA: Insufficient documentation

## 2021-06-11 LAB — CBC
HCT: 48.5 % — ABNORMAL HIGH (ref 36.0–46.0)
Hemoglobin: 16 g/dL — ABNORMAL HIGH (ref 12.0–15.0)
MCH: 31.5 pg (ref 26.0–34.0)
MCHC: 33 g/dL (ref 30.0–36.0)
MCV: 95.5 fL (ref 80.0–100.0)
Platelets: 263 10*3/uL (ref 150–400)
RBC: 5.08 MIL/uL (ref 3.87–5.11)
RDW: 14.5 % (ref 11.5–15.5)
WBC: 8.5 10*3/uL (ref 4.0–10.5)
nRBC: 0 % (ref 0.0–0.2)

## 2021-06-11 LAB — BASIC METABOLIC PANEL
Anion gap: 8 (ref 5–15)
BUN: 10 mg/dL (ref 6–20)
CO2: 29 mmol/L (ref 22–32)
Calcium: 9.3 mg/dL (ref 8.9–10.3)
Chloride: 101 mmol/L (ref 98–111)
Creatinine, Ser: 0.9 mg/dL (ref 0.44–1.00)
GFR, Estimated: >60 mL/min (ref >60)
Glucose, Bld: 100 mg/dL — ABNORMAL HIGH (ref 70–99)
Potassium: 5.1 mmol/L (ref 3.5–5.1)
Sodium: 138 mmol/L (ref 135–145)

## 2021-06-11 LAB — BRAIN NATRIURETIC PEPTIDE: B Natriuretic Peptide: 56.8 pg/mL (ref 0.0–100.0)

## 2021-06-11 MED ORDER — APIXABAN 5 MG PO TABS: 5.0000 mg | ORAL_TABLET | Freq: Two times a day (BID) | ORAL | 5 refills | Status: DC

## 2021-06-11 MED ORDER — SPIRONOLACTONE 25 MG PO TABS: 25.0000 mg | ORAL_TABLET | Freq: Every day | ORAL | 5 refills | Status: DC

## 2021-06-11 MED ORDER — DIGOXIN 125 MCG PO TABS
0.1250 mg | ORAL_TABLET | Freq: Every day | ORAL | 5 refills | Status: DC
Start: 2021-06-11 — End: 2021-07-02

## 2021-06-11 MED ORDER — DAPAGLIFLOZIN PROPANEDIOL 10 MG PO TABS: 10.0000 mg | ORAL_TABLET | Freq: Every day | ORAL | 5 refills | Status: DC

## 2021-06-11 MED ORDER — ATORVASTATIN CALCIUM 20 MG PO TABS: 20.0000 mg | ORAL_TABLET | Freq: Every day | ORAL | 5 refills | Status: DC

## 2021-06-11 MED ORDER — SACUBITRIL-VALSARTAN 49-51 MG PO TABS: 1.0000 | ORAL_TABLET | Freq: Two times a day (BID) | ORAL | 5 refills | Status: DC

## 2021-06-11 MED ORDER — CARVEDILOL 6.25 MG PO TABS: 6.2500 mg | ORAL_TABLET | Freq: Two times a day (BID) | ORAL | 5 refills | Status: DC

## 2021-06-11 MED ORDER — AMIODARONE HCL 200 MG PO TABS: 200.0000 mg | ORAL_TABLET | Freq: Every day | ORAL | 5 refills | Status: DC

## 2021-06-11 NOTE — Patient Instructions (Addendum)
Labs done today. We will contact you only if your labs are abnormal.  No medication changes were made. Please continue all current medications as prescribed.  We have referred you to paramedicine. Someone will contact you to schedule a home visit.   Your physician recommends that you schedule a follow-up appointment in: 2 weeks for an appointment with our Clinic Pharmacist and in 4 weeks with our NP/PA Clinic here in our office.   If you have any questions or concerns before your next appointment please send [place] a message through [place] or call our office at [phone].    TO LEAVE A MESSAGE FOR THE NURSE SELECT OPTION 2, PLEASE LEAVE A MESSAGE INCLUDING: YOUR NAME DATE OF BIRTH CALL BACK NUMBER REASON FOR CALL**this is important as we prioritize the call backs  YOU WILL RECEIVE A CALL BACK THE SAME DAY AS LONG AS YOU CALL BEFORE 4:00 PM   Do the following things EVERYDAY: Weigh yourself in the morning before breakfast. Write it down and keep it in a log. Take your medicines as prescribed Eat low salt foods--Limit salt (sodium) to 2000 mg per day.  Stay as active as you can everyday Limit all fluids for the day to less than 2 liters   At the [facility], you and your health needs are our priority. As part of our continuing mission to provide you with exceptional heart care, we have created designated Provider Care Teams. These Care Teams include your primary Cardiologist (physician) and Advanced Practice Providers (APPs- Physician Assistants and Nurse Practitioners) who all work together to provide you with the care you need, when you need it.   You may see any of the following providers on your designated Care Team at your next follow up: Dr Arvilla Meres Dr Carron Curie, NP Robbie Lis, [place] Karle Plumber, PharmD   Please be sure to bring in all your medications bottles to every appointment. '

## 2021-06-11 NOTE — Progress Notes (Signed)
Heart and Vascular Care Navigation  06/11/2021  [name] 02-10-1970 [id number]  Reason for Referral: Enrollment in [facility] Paramedicine program   Engaged with patient face to face for initial visit for Heart and Vascular Care Coordination.                                                                                                   Paramedicine Initial Assessment:  Housing:  In what kind of housing do you live? House/apt/trailer/shelter? Mobile home  Do you live with anyone? Reports she lives with her boyfriend, [name], who is present at appt.  Plan after hospital stay was to live with dtr in [facility] but reports she was there less than a day before what sounds like her and her dtr decided this wouldn't work out- pt says that her needs were too much for her dtr.  Are you currently worried about losing your housing? no  Social:  What is your current marital status? single  Do you have any children? Dtr, Hospital doctor, who lives in [place]   Food:  No concerns expressed- receives food stamps.  Income:  What is your current source of income? SSDI  Insurance:  Are you currently insured? Medicaid  Do you have prescription coverage? yes  Transportation:  Do you have transportation to your medical appointments? Utilizes Medicaid transport- has the number and understands how to book rides.  Gets everything else she needs delivered.  Are there any additional barriers you see to getting the care you need? No other concerns expressed   HRT/VAS Care Coordination     Patients Home Cardiology Office Heart Failure Clinic   Outpatient Care Team [facility]; Social Worker   Scientist, research (medical) Name: [name]   Social Worker Name: [name], [facility], [phone]   Living arrangements for the past 2 months Mobile Home   Lives with: Significant Other   Patient Current Insurance Coverage Medicaid   Patient Has Concern With Paying Medical  Bills No   Does Patient Have Prescription Coverage? Yes   Home Assistive Devices/Equipment Eyeglasses; Dentures (specify type); Grab bars in shower  Uppers   DME Agency [employer]   Current home services DME  shower chair       Social History:                                                                             SDOH Screenings   Alcohol Screen: Not on file  Depression (WCH8-5): Not on file  Financial Resource Strain: Not on file  Food Insecurity: No Food Insecurity   Worried About Running Out of Food in the Last Year: Never true   Ran Out of Food in the Last Year: Never true  Housing: Low Risk    Last Housing Risk Score: 0  Physical Activity: Not on file  Social Connections: Not on file  Stress: Not on file  Tobacco Use: High Risk   Smoking Tobacco Use: Every Day   Smokeless Tobacco Use: Never   Passive Exposure: Not on file  Transportation Needs: No Transportation Needs   Lack of Transportation (Medical): No   Lack of Transportation (Non-Medical): No    SDOH Interventions: Financial Resources:    N/a  Food Insecurity:  No concerns- reports they receive food stamps  Housing Insecurity:  Housing Interventions: Intervention Not Indicated  Transportation:   Transportation Interventions: Payor Benefit     Follow-up plan:    Referral sent to [employer] for assignment and follow up  Will continue to follow and assist as needed  Burna Sis, LCSW Clinical Social Worker [facility] Desk#: [phone] Cell#: [phone]

## 2021-06-11 NOTE — Addendum Note (Signed)
Encounter addended by: Burna Sis, LCSW on: 06/11/2021 2:52 PM  Actions taken: Flowsheet accepted, Clinical Note Signed

## 2021-06-17 NOTE — Progress Notes (Cosign Needed Addendum)
[facility] Note   PCP: Dr Sudie Bailey Primary Cardiologist: Dr Shirlee Latch   HPI:  [name] is a 51 year old with a h/o chronic pain, anxiety, depression, substance abuse, A flutter, and HFrEF.     Admitted 05/16/21 with respiratory failure + A flutter. UDS + benzos & meth. Requiring several days of mechanical inhalation.  Echo with newly reduced EF 20-25% and RV severely reduced.  She has been in atrial flutter with mild RVR since at least 02/2021, could be tachy-mediated CMP. Also consider stress cardiomyopathy from sepsis/PNA.  No significant CAD on 05/2016 cath. Cardioverted back to NSR on 05/2017.  GDMT started for heart failure. Discharged 06/03/21.    Presented to [facility] Clinic  on 06/11/21 for post hospital HF follow up with her boyfriend, [name]. Overall was feeling fine. Denied SOB/PND/Orthopnea. Appetite was ok. No fever or chills. She had not been weighing at home. Reported taking all medications. Was smoking 1 pack of cigarettes every 2 days.  Living with her boyfriend and son in a trailer.    Social: Lives in trailer. She completed 7th grade. She has hard time reading labels.  Smoking 1 pack every 2 days. She does not drive.   Today she returns to HF clinic for pharmacist medication titration. At last visit with APP, no medication changes were made as it was unclear how she was taking her medications. Overall she is feeling well today. She did not bring her medications to clinic but did bring her list. She noted that she was taking everything as prescribed except potassium. She reported taking Potassium 20 mEq BID instead of 40 mEq BID. States her boyfriend helps her manage her medications. She also gets help from [name], paramedic in [place]. Her main complaint today is that she wasn't sure if it was ok to restart her antidepressants after discharge so she has been without them. She states she knew not to restart the Vyvanse but was not sure about the Trintellix, Celexa  or the amitriptyline. She has been taking amitriptyline 25 mg daily to helps with "muscle spasms".  No dizziness, lightheadedness, CP or palpitations. No SOB/DOE. Weight at home has been stable at 205-206 lbs. She does not need a loop diuretic. No LEE, PND or orthopnea.     HF Medications: Carvedilol 6.25 mg BID Entresto 49/51 mg BID Spironolactone 25 mg daily Farxiga 10 mg daily Digoxin 0.125 mg daily  Has the patient been experiencing any side effects to the medications prescribed?  no  Does the patient have any problems obtaining medications due to transportation or finances?   No - BCBS Medicaid  Understanding of regimen: fair Understanding of indications: fair Potential of compliance: fair Patient understands to avoid NSAIDs. Patient understands to avoid decongestants.    Pertinent Lab Values: 06/11/21: Serum creatinine 0.90, BUN 10, Potassium 5.1, Sodium 138 BMET today pending  Vital Signs: Weight: 205 lbs (last clinic weight: 209 lbs) Blood pressure: 122/80  Heart rate: 77   Assessment/Plan: 1. HFrEF  Nonischemic CMP.  No prior history of CHF.  Echo 05/2021  showed LV EF < 20% with moderate LV dilation, severely decreased RV systolic function, moderate TR, normal IVC.  She has been in atrial flutter with mild RVR since at least 02/2021, could be tachy-mediated CMP. Also consider stress cardiomyopathy from sepsis/PNA.  No significant CAD on 05/2016 cath.  - NYHA II. Volume status stable.  - BMET today pending - Increase carvedilol to 12.5 mg BID.  -  Continue Entresto 49-51 mg BID  - Continue spironolactone 25 mg daily.  - Continue dapagliflozin 10 mg daily.  - Continue digoxin 0.125 mg daily, dig level 06/02/21 -->0.3    - Plan to repeat ECHO in 3 months after HF meds optimized.    2. PAF -S/P DC-CV 05/2021 with restoration of SR -Continue amiodarone 200 mg daily  -Continue Eliquis 5 mg BID   3. Substance Abuse  05/2021 UDS with methamphetamines. Discussed cessation.     4. Tobacco Abuse Discussed cessation.   5. Hx of Major depressive disorder with anxiety  and Chronic pain syndrome.  -Ok to restart Celexa 20 mg daily, Trintellix 10 mg daily and amitriptyline 25 mg daily. She has follow up with her psychiatrist in 2 weeks. They will continue to manage.  -She understands no Vyvanse or Adderall.      Follow up 4 weeks with APP Clinic    Karle Plumber, PharmD, BCPS, BCCP, CPP Heart Failure Clinic Pharmacist [phone]

## 2021-06-19 ENCOUNTER — Telehealth (HOSPITAL_COMMUNITY): Payer: Self-pay

## 2021-06-19 NOTE — Telephone Encounter (Signed)
Transitions of Care Pharmacy  ° °Call attempted for a pharmacy transitions of care follow-up. HIPAA appropriate voicemail was left with call back information provided.  ° °Call attempt #1. Will follow-up in 2-3 days.  °  °

## 2021-06-27 ENCOUNTER — Telehealth (HOSPITAL_COMMUNITY): Payer: Self-pay

## 2021-06-27 NOTE — Telephone Encounter (Signed)
Transitions of Care Pharmacy   Call attempted for a pharmacy transitions of care follow-up. HIPAA appropriate voicemail was left with call back information provided.   Call attempt #2. Will follow-up in 2-3 days.    

## 2021-07-01 ENCOUNTER — Other Ambulatory Visit (HOSPITAL_COMMUNITY): Payer: Self-pay

## 2021-07-02 ENCOUNTER — Ambulatory Visit (HOSPITAL_COMMUNITY)
Admission: RE | Admit: 2021-07-02 | Discharge: 2021-07-02 | Disposition: A | Discharge status: Home / Self Care | Payer: Medicaid Other | Source: Ambulatory Visit | Attending: Internal Medicine | Admitting: Internal Medicine

## 2021-07-02 VITALS — BP 122/80 | HR 77 | Wt 205.0 lb

## 2021-07-02 DIAGNOSIS — Z7901 Long term (current) use of anticoagulants: Secondary | ICD-10-CM | POA: Insufficient documentation

## 2021-07-02 DIAGNOSIS — F1910 Other psychoactive substance abuse, uncomplicated: Secondary | ICD-10-CM | POA: Insufficient documentation

## 2021-07-02 DIAGNOSIS — Z79899 Other long term (current) drug therapy: Secondary | ICD-10-CM | POA: Insufficient documentation

## 2021-07-02 DIAGNOSIS — F17210 Nicotine dependence, cigarettes, uncomplicated: Secondary | ICD-10-CM | POA: Diagnosis not present

## 2021-07-02 DIAGNOSIS — G894 Chronic pain syndrome: Secondary | ICD-10-CM | POA: Diagnosis not present

## 2021-07-02 DIAGNOSIS — I5020 Unspecified systolic (congestive) heart failure: Secondary | ICD-10-CM | POA: Insufficient documentation

## 2021-07-02 DIAGNOSIS — I480 Paroxysmal atrial fibrillation: Secondary | ICD-10-CM | POA: Insufficient documentation

## 2021-07-02 DIAGNOSIS — I4892 Unspecified atrial flutter: Secondary | ICD-10-CM | POA: Diagnosis not present

## 2021-07-02 MED ORDER — APIXABAN 5 MG PO TABS: 5.0000 mg | ORAL_TABLET | Freq: Two times a day (BID) | ORAL | 5 refills | Status: DC

## 2021-07-02 MED ORDER — POTASSIUM CHLORIDE CRYS ER 20 MEQ PO TBCR: 20.0000 meq | EXTENDED_RELEASE_TABLET | Freq: Two times a day (BID) | ORAL | 3 refills | Status: DC

## 2021-07-02 MED ORDER — DIGOXIN 125 MCG PO TABS: 0.1250 mg | ORAL_TABLET | Freq: Every day | ORAL | 5 refills | Status: DC

## 2021-07-02 MED ORDER — SPIRONOLACTONE 25 MG PO TABS: 25.0000 mg | ORAL_TABLET | Freq: Every day | ORAL | 5 refills | Status: DC

## 2021-07-02 MED ORDER — SACUBITRIL-VALSARTAN 49-51 MG PO TABS: 1.0000 | ORAL_TABLET | Freq: Two times a day (BID) | ORAL | 5 refills | Status: DC

## 2021-07-02 MED ORDER — CARVEDILOL 12.5 MG PO TABS: 12.5000 mg | ORAL_TABLET | Freq: Two times a day (BID) | ORAL | 5 refills | Status: DC

## 2021-07-02 MED ORDER — ATORVASTATIN CALCIUM 20 MG PO TABS: 20.0000 mg | ORAL_TABLET | Freq: Every day | ORAL | 5 refills | Status: DC

## 2021-07-02 MED ORDER — AMIODARONE HCL 200 MG PO TABS
200.0000 mg | ORAL_TABLET | Freq: Every day | ORAL | 5 refills | Status: DC
Start: 2021-07-02 — End: 2021-12-23

## 2021-07-02 MED ORDER — DAPAGLIFLOZIN PROPANEDIOL 10 MG PO TABS: 10.0000 mg | ORAL_TABLET | Freq: Every day | ORAL | 5 refills | Status: DC

## 2021-07-02 NOTE — Patient Instructions (Signed)
It was a pleasure seeing you today!  MEDICATIONS: -We are changing your medications today -Increase carvedilol to 12.5 mg (1 tablet) twice daily.  -Call if you have questions about your medications.  LABS: -We will call you if your labs need attention.  NEXT APPOINTMENT: Return to clinic in 4 weeks with [facility].  In general, to take care of your heart failure: -Limit your fluid intake to 2 Liters (half-gallon) per day.   -Limit your salt intake to ideally 2-3 grams (2000-3000 mg) per day. -Weigh yourself daily and record, and bring that "weight diary" to your next appointment.  (Weight gain of 2-3 pounds in 1 day typically means fluid weight.) -The medications for your heart are to help your heart and help you live longer.   -Please contact us before stopping any of your heart medications.  Call the clinic at [phone] with questions or to reschedule future appointments.

## 2021-07-18 ENCOUNTER — Encounter (HOSPITAL_COMMUNITY): Payer: Medicaid Other

## 2021-08-01 ENCOUNTER — Other Ambulatory Visit (HOSPITAL_COMMUNITY): Payer: Self-pay | Admitting: Family Medicine

## 2021-08-01 ENCOUNTER — Encounter (HOSPITAL_COMMUNITY): Payer: Self-pay

## 2021-08-01 ENCOUNTER — Ambulatory Visit (HOSPITAL_COMMUNITY)
Admission: RE | Admit: 2021-08-01 | Discharge: 2021-08-01 | Disposition: A | Discharge status: Home / Self Care | Payer: Medicaid Other | Source: Ambulatory Visit | Attending: Family Medicine | Admitting: Family Medicine

## 2021-08-01 VITALS — BP 90/50 | HR 43 | Wt 205.6 lb

## 2021-08-01 DIAGNOSIS — E875 Hyperkalemia: Secondary | ICD-10-CM

## 2021-08-01 DIAGNOSIS — I480 Paroxysmal atrial fibrillation: Secondary | ICD-10-CM | POA: Diagnosis not present

## 2021-08-01 DIAGNOSIS — Z7901 Long term (current) use of anticoagulants: Secondary | ICD-10-CM | POA: Diagnosis not present

## 2021-08-01 DIAGNOSIS — I5022 Chronic systolic (congestive) heart failure: Secondary | ICD-10-CM | POA: Diagnosis not present

## 2021-08-01 DIAGNOSIS — F1910 Other psychoactive substance abuse, uncomplicated: Secondary | ICD-10-CM | POA: Diagnosis not present

## 2021-08-01 DIAGNOSIS — I959 Hypotension, unspecified: Secondary | ICD-10-CM | POA: Insufficient documentation

## 2021-08-01 DIAGNOSIS — I4892 Unspecified atrial flutter: Secondary | ICD-10-CM | POA: Insufficient documentation

## 2021-08-01 DIAGNOSIS — R42 Dizziness and giddiness: Secondary | ICD-10-CM | POA: Insufficient documentation

## 2021-08-01 DIAGNOSIS — I5020 Unspecified systolic (congestive) heart failure: Secondary | ICD-10-CM

## 2021-08-01 DIAGNOSIS — Z79899 Other long term (current) drug therapy: Secondary | ICD-10-CM | POA: Diagnosis not present

## 2021-08-01 DIAGNOSIS — Z720 Tobacco use: Secondary | ICD-10-CM

## 2021-08-01 DIAGNOSIS — I428 Other cardiomyopathies: Secondary | ICD-10-CM | POA: Insufficient documentation

## 2021-08-01 LAB — BASIC METABOLIC PANEL
Anion gap: 9 (ref 5–15)
BUN: 12 mg/dL (ref 6–20)
CO2: 23 mmol/L (ref 22–32)
Calcium: 8.9 mg/dL (ref 8.9–10.3)
Chloride: 106 mmol/L (ref 98–111)
Creatinine, Ser: 1.3 mg/dL — ABNORMAL HIGH (ref 0.44–1.00)
GFR, Estimated: 50 mL/min — ABNORMAL LOW (ref >60)
Glucose, Bld: 90 mg/dL (ref 70–99)
Potassium: 5.8 mmol/L — ABNORMAL HIGH (ref 3.5–5.1)
Sodium: 138 mmol/L (ref 135–145)

## 2021-08-01 LAB — DIGOXIN LEVEL: Digoxin Level: 1.6 ng/mL (ref 0.8–2.0)

## 2021-08-01 LAB — BRAIN NATRIURETIC PEPTIDE: B Natriuretic Peptide: 73.8 pg/mL (ref 0.0–100.0)

## 2021-08-01 MED ORDER — LOKELMA 5 G PO PACK: 5.0000 g | PACK | Freq: Once | ORAL | 0 refills | Status: AC

## 2021-08-01 MED ORDER — ENTRESTO 24-26 MG PO TABS: 1.0000 | ORAL_TABLET | Freq: Two times a day (BID) | ORAL | 6 refills | Status: DC

## 2021-08-01 NOTE — Progress Notes (Addendum)
PCP: Dr Sudie Bailey HF Cardiologist: Dr Shirlee Latch   HPI: [name] is a 51 year old with a h/o chronic pain, anxiety, depression, substance abuse, A flutter, and HFrEF.     Admitted 05/16/21 with respiratory failure + A flutter. UDS + benzos & meth. Requiring several days of mechanical inhalation.  Echo with newly reduced EF 20-25% and RV severely reduced.  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 Also consider stress cardiomyopathy from sepsis/PNA.  No significant CAD on 5/18 cath. Cardioverted back to NSR on 5/19.  GDMT started for heart failure. Discharged 06/03/21.   Post hospital follow up, NYHA II and euvolemic. Referred to [facility].   Today she returns for HF follow up. Overall feeling weak and tired. Legs are weak and "giving out." She is SOB if she pushes herself around the house. She is dizzy but no falls. Denies palpitations, abnormal bleeding,  CP, dizziness, edema, or PND/Orthopnea. Appetite ok. No fever or chills. BP and paramedicine help with meds. Taking all medications. Smokes 1.5 ppd cigarettes, no ETOH/drugs.   Social: Lives in trailer. She completed 7th grade. She has hard time reading labels.  She does not drive.   ECG (personally reviewed): marked sinus bradycardia, HR 42  Labs (6/23): K 5.1, creatinine 0.90  Cardiac Testing   - Echo (5/23): moderate LV dilation with EF 20-25%, moderately decreased RV systolic function.   2017 Echo: 55-60%   - LHC (5/23): no significant CAD  - DC-CV (5/23)--> NSR    ROS: All systems negative except as listed in HPI, PMH and Problem List.  SH:  Social History   Socioeconomic History   Marital status: Divorced    Spouse name: Not on file   Number of children: Not on file   Years of education: Not on file   Highest education level: Not on file  Occupational History   Not on file  Tobacco Use   Smoking status: Every Day    Packs/day: 1.50    Years: 25.00     Total pack years: 37.50    Types: Cigarettes   Smokeless tobacco: Never  Substance and Sexual Activity   Alcohol use: No   Drug use: No   Sexual activity: Yes    Birth control/protection: Surgical  Other Topics Concern   Not on file  Social History Narrative   Not on file   Social Determinants of Health   Financial Resource Strain: Not on file  Food Insecurity: No Food Insecurity (06/11/2021)   Hunger Vital Sign    Worried About Running Out of Food in the Last Year: Never true    Ran Out of Food in the Last Year: Never true  Transportation Needs: No Transportation Needs (06/11/2021)   PRAPARE - Administrator, Civil Service (Medical): No    Lack of Transportation (Non-Medical): No  Physical Activity: Not on file  Stress: Not on file  Social Connections: Not on file  Intimate Partner Violence: Not on file    FH:  Family History  Problem Relation Age of Onset   Heart disease Other    Arthritis Other    Lung disease Other    Cancer Other    Asthma Other    Diabetes Other     Past Medical History:  Diagnosis Date   Anxiety    Back pain    Chronic back pain 02/04/13   By patient report   Depression  Factor 5 Leiden mutation, heterozygous (HCC)    Hypertension    Laryngitis, chronic    Panic attack    Scoliosis    Ulcer     Current Outpatient Medications  Medication Sig Dispense Refill   albuterol (PROVENTIL HFA;VENTOLIN HFA) 108 (90 BASE) MCG/ACT inhaler Inhale 2 puffs into the lungs every 4 (four) hours as needed for wheezing or shortness of breath. 1 Inhaler 0   amiodarone (PACERONE) 200 MG tablet Take 1 tablet (200 mg total) by mouth daily. 30 tablet 5   amitriptyline (ELAVIL) 25 MG tablet Take 25 mg by mouth at bedtime.     apixaban (ELIQUIS) 5 MG TABS tablet Take 1 tablet (5 mg total) by mouth 2 (two) times daily. 60 tablet 5   aspirin EC 81 MG EC tablet Take 1 tablet (81 mg total) by mouth daily.     atorvastatin (LIPITOR) 20 MG tablet Take 1  tablet (20 mg total) by mouth daily. 30 tablet 5   bismuth subsalicylate (PEPTO BISMOL) 262 MG chewable tablet Chew 524 mg by mouth 3 (three) times daily as needed for indigestion.     budesonide (PULMICORT) 0.25 MG/2ML nebulizer solution Take 2 mLs (0.25 mg total) by nebulization 2 (two) times daily. 60 mL 12   carvedilol (COREG) 12.5 MG tablet Take 1 tablet (12.5 mg total) by mouth 2 (two) times daily with a meal. 60 tablet 5   celecoxib (CELEBREX) 200 MG capsule Take 200 mg by mouth 2 (two) times daily.     citalopram (CELEXA) 20 MG tablet Take 20 mg by mouth daily.     dapagliflozin propanediol (FARXIGA) 10 MG TABS tablet Take 1 tablet (10 mg total) by mouth daily. 30 tablet 5   digoxin (LANOXIN) 0.125 MG tablet Take 1 tablet (0.125 mg total) by mouth daily. 30 tablet 5   fluticasone (FLONASE) 50 MCG/ACT nasal spray Place 2 sprays into both nostrils as needed for allergies or rhinitis.     ipratropium-albuterol (DUONEB) 0.5-2.5 (3) MG/3ML SOLN Take 3 mLs by nebulization every 6 (six) hours as needed. 360 mL 0   magnesium oxide (MAG-OX) 400 MG tablet Take 400 mg by mouth daily.     NON FORMULARY NERVIVE (nerve relief)     pantoprazole (PROTONIX) 40 MG tablet Take 40 mg by mouth daily.     potassium chloride SA (KLOR-CON M) 20 MEQ tablet Take 1 tablet (20 mEq total) by mouth 2 (two) times daily. 60 tablet 3   revefenacin (YUPELRI) 175 MCG/3ML nebulizer solution Take 3 mLs (175 mcg total) by nebulization daily. 90 mL 0   sacubitril-valsartan (ENTRESTO) 49-51 MG Take 1 tablet by mouth 2 (two) times daily. 60 tablet 5   spironolactone (ALDACTONE) 25 MG tablet Take 1 tablet (25 mg total) by mouth daily. 30 tablet 5   sucralfate (CARAFATE) 1 g tablet Take 1 tablet (1 g total) by mouth 2 (two) times daily. 30 tablet 0   Vitamin D, Ergocalciferol, (DRISDOL) 1.25 MG (50000 UNIT) CAPS capsule Take 50,000 Units by mouth 2 (two) times a week.     vortioxetine HBr (TRINTELLIX) 10 MG TABS tablet Take 10 mg  by mouth daily.     No current facility-administered medications for this encounter.   BP (!) 90/50   Pulse (!) 43   Wt 93.3 kg (205 lb 9.6 oz)   SpO2 93%   BMI 36.42 kg/m   Wt Readings from Last 3 Encounters:  08/01/21 93.3 kg (205 lb 9.6 oz)  07/02/21  93 kg (205 lb)  06/11/21 94.8 kg (209 lb)   PHYSICAL EXAM: General:  NAD. No resp difficulty HEENT: Normal Neck: Supple. No JVD. Carotids 2+ bilat; no bruits. No lymphadenopathy or thryomegaly appreciated. Cor: PMI nondisplaced. Brady rate & rhythm. No rubs, gallops or murmurs. Lungs: Clear Abdomen: Obese, soft, nontender, nondistended. No hepatosplenomegaly. No bruits or masses. Good bowel sounds. Extremities: No cyanosis, clubbing, rash, edema Neuro: Alert & oriented x 3, cranial nerves grossly intact. Moves all 4 extremities w/o difficulty. Affect pleasant.  ASSESSMENT & PLAN: 1. HFrEF  Nonischemic CMP.  No prior history of CHF.  Echo 05/2021  showed LV EF < 20% with moderate LV dilation, severely decreased RV systolic function, moderate TR, normal IVC.  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 Also consider stress cardiomyopathy from sepsis/PNA.  No significant CAD on 5/23 cath.  - NYHA II-early III. Volume status stable.  - HR 42 and dizzy, stop digoxin and Coreg. - Decrease Entresto to 24/26 mg bid with low BP. BMET and dig level today. - Continue spironolactone 25 mg daily.  - Continue dapagliflozin 10 mg daily.  - Repeat ECHO in 3 months after HF meds optimized.   2. PAF - S/P DC-CV 05/2021 with restoration of SR. - Marked SB on ECG today, HR 42. - Med changes as above. - Continue amio 200 mg daily. Stop amio next.  - Continue Eliquis 5 mg bid. - Recent CBC stable.  3. Substance Abuse  - 05/2021 UDS + with methamphetamines. - Discussed cessation. She denies further use.  4. Tobacco Abuse - Discussed cessation.   I will notify [name] with [facility]  Paramedicine of today's medicine changes.  Follow up in 3 weeks with APP and 3 months with Dr. Shirlee Latch + echo.  Danielle Malta Suzanne Kho FNP-BC  2:07 PM

## 2021-08-01 NOTE — Patient Instructions (Signed)
STOP Coreg (Carvedilol) STOP Digoxin  DECREASE Entresto to 24/26 mg, one tab twice a day  Labs today We will only contact you if something comes back abnormal or we need to make some changes. Otherwise no news is good news!   Your physician recommends that you schedule a follow-up appointment in: 3-4 weeks  in the Advanced Practitioners (PA/NP) Clinic   Your physician wants you to follow-up in: 3 months with Dr Shirlee Latch and echo. You will receive a reminder letter in the mail two months in advance. If you don't receive a letter, please call our office to schedule the follow-up appointment.  Your physician has requested that you have an echocardiogram. Echocardiography is a painless test that uses sound waves to create images of your heart. It provides your doctor with information about the size and shape of your heart and how well your heart's chambers and valves are working. This procedure takes approximately one hour. There are no restrictions for this procedure.  Do the following things EVERYDAY: Weigh yourself in the morning before breakfast. Write it down and keep it in a log. Take your medicines as prescribed Eat low salt foods--Limit salt (sodium) to 2000 mg per day.  Stay as active as you can everyday Limit all fluids for the day to less than 2 liters   At the [facility], you and your health needs are our priority. As part of our continuing mission to provide you with exceptional heart care, we have created designated Provider Care Teams. These Care Teams include your primary Cardiologist (physician) and Advanced Practice Providers (APPs- Physician Assistants and Nurse Practitioners) who all work together to provide you with the care you need, when you need it.   You may see any of the following providers on your designated Care Team at your next follow up: Dr Arvilla Meres Dr Carron Curie, NP Robbie Lis, [place] [facility] [place], [place] Karle Plumber, PharmD   Please be sure to bring in all your medications bottles to every appointment.

## 2021-08-05 ENCOUNTER — Ambulatory Visit (HOSPITAL_COMMUNITY)
Admission: RE | Admit: 2021-08-05 | Discharge: 2021-08-05 | Disposition: A | Discharge status: Home / Self Care | Payer: Medicaid Other | Source: Ambulatory Visit | Attending: Internal Medicine | Admitting: Internal Medicine

## 2021-08-05 DIAGNOSIS — E875 Hyperkalemia: Secondary | ICD-10-CM | POA: Insufficient documentation

## 2021-08-05 LAB — BASIC METABOLIC PANEL
Anion gap: 10 (ref 5–15)
BUN: 13 mg/dL (ref 6–20)
CO2: 26 mmol/L (ref 22–32)
Calcium: 8.8 mg/dL — ABNORMAL LOW (ref 8.9–10.3)
Chloride: 103 mmol/L (ref 98–111)
Creatinine, Ser: 1.42 mg/dL — ABNORMAL HIGH (ref 0.44–1.00)
GFR, Estimated: 45 mL/min — ABNORMAL LOW (ref >60)
Glucose, Bld: 72 mg/dL (ref 70–99)
Potassium: 4.9 mmol/L (ref 3.5–5.1)
Sodium: 139 mmol/L (ref 135–145)

## 2021-08-07 ENCOUNTER — Telehealth (HOSPITAL_COMMUNITY): Payer: Self-pay | Admitting: Surgery

## 2021-08-07 NOTE — Telephone Encounter (Signed)
-----   Message from Jacklynn Ganong, [place] sent at 08/05/2021  1:38 PM EDT ----- K improved. Remain off Entresto, KCL and spiro for now.  Please arrange APP follow up in 1-2 weeks to address medications.

## 2021-08-07 NOTE — Telephone Encounter (Signed)
I attempted to reach patient to review results and recommendations per provider.  I left a message for a return call. 

## 2021-08-21 ENCOUNTER — Encounter (HOSPITAL_COMMUNITY): Payer: Medicaid Other

## 2021-08-21 ENCOUNTER — Encounter (HOSPITAL_COMMUNITY): Payer: Self-pay

## 2021-09-03 ENCOUNTER — Telehealth (HOSPITAL_COMMUNITY): Payer: Self-pay

## 2021-09-03 NOTE — Telephone Encounter (Signed)
Called and left patient a message to confirm/remind patient of their appointment at the [facility] on 09/04/21.

## 2021-09-04 ENCOUNTER — Encounter (HOSPITAL_COMMUNITY): Payer: Self-pay

## 2021-09-04 ENCOUNTER — Ambulatory Visit (HOSPITAL_COMMUNITY)
Admission: RE | Admit: 2021-09-04 | Discharge: 2021-09-04 | Disposition: A | Discharge status: Home / Self Care | Payer: Medicaid Other | Source: Ambulatory Visit | Attending: Adult Health | Admitting: Adult Health

## 2021-09-04 VITALS — BP 118/78 | HR 77 | Wt 205.4 lb

## 2021-09-04 DIAGNOSIS — Z720 Tobacco use: Secondary | ICD-10-CM

## 2021-09-04 DIAGNOSIS — I480 Paroxysmal atrial fibrillation: Secondary | ICD-10-CM

## 2021-09-04 DIAGNOSIS — I5020 Unspecified systolic (congestive) heart failure: Secondary | ICD-10-CM | POA: Diagnosis not present

## 2021-09-04 DIAGNOSIS — I428 Other cardiomyopathies: Secondary | ICD-10-CM | POA: Diagnosis not present

## 2021-09-04 DIAGNOSIS — F1910 Other psychoactive substance abuse, uncomplicated: Secondary | ICD-10-CM | POA: Insufficient documentation

## 2021-09-04 DIAGNOSIS — G8929 Other chronic pain: Secondary | ICD-10-CM | POA: Insufficient documentation

## 2021-09-04 DIAGNOSIS — Z7901 Long term (current) use of anticoagulants: Secondary | ICD-10-CM | POA: Insufficient documentation

## 2021-09-04 DIAGNOSIS — I5022 Chronic systolic (congestive) heart failure: Secondary | ICD-10-CM | POA: Insufficient documentation

## 2021-09-04 DIAGNOSIS — I4892 Unspecified atrial flutter: Secondary | ICD-10-CM | POA: Insufficient documentation

## 2021-09-04 DIAGNOSIS — F32A Depression, unspecified: Secondary | ICD-10-CM | POA: Insufficient documentation

## 2021-09-04 DIAGNOSIS — F419 Anxiety disorder, unspecified: Secondary | ICD-10-CM | POA: Insufficient documentation

## 2021-09-04 DIAGNOSIS — F17200 Nicotine dependence, unspecified, uncomplicated: Secondary | ICD-10-CM | POA: Insufficient documentation

## 2021-09-04 NOTE — Patient Instructions (Signed)
It was great to see you today! No medication changes are needed at this time.   Your physician recommends that you schedule a follow-up appointment in: 3 weeks with the pharmacy team -please being all medication bottles to this appointment  Follow up with Dr Shirlee Latch in 8 weeks and echo  Your physician has requested that you have an echocardiogram. Echocardiography is a painless test that uses sound waves to create images of your heart. It provides your doctor with information about the size and shape of your heart and how well your heart's chambers and valves are working. This procedure takes approximately one hour. There are no restrictions for this procedure.   Do the following things EVERYDAY: Weigh yourself in the morning before breakfast. Write it down and keep it in a log. Take your medicines as prescribed Eat low salt foods--Limit salt (sodium) to 2000 mg per day.  Stay as active as you can everyday Limit all fluids for the day to less than 2 liters   At the [facility], you and your health needs are our priority. As part of our continuing mission to provide you with exceptional heart care, we have created designated Provider Care Teams. These Care Teams include your primary Cardiologist (physician) and Advanced Practice Providers (APPs- Physician Assistants and Nurse Practitioners) who all work together to provide you with the care you need, when you need it.   You may see any of the following providers on your designated Care Team at your next follow up: Dr Arvilla Meres Dr Carron Curie, NP Robbie Lis, [place] [facility] [place], [place] Karle Plumber, PharmD   Please be sure to bring in all your medications bottles to every appointment.    If you have any questions or concerns before your next appointment please send [place] a message through [place] or call our office at [phone].    TO LEAVE A MESSAGE FOR THE NURSE SELECT OPTION  2, PLEASE LEAVE A MESSAGE INCLUDING: YOUR NAME DATE OF BIRTH CALL BACK NUMBER REASON FOR CALL**this is important as we prioritize the call backs  YOU WILL RECEIVE A CALL BACK THE SAME DAY AS LONG AS YOU CALL BEFORE 4:00 PM

## 2021-09-04 NOTE — Progress Notes (Signed)
PCP: Dr Sudie Bailey HF Cardiologist: Dr Shirlee Latch   HPI: Ms Shingledecker is a 51 year old with a h/o chronic pain, anxiety, depression, substance abuse, A flutter, and HFrEF.     Admitted 05/16/21 with respiratory failure + A flutter. UDS + benzos & meth. Requiring several days of mechanical inhalation.  Echo with newly reduced EF 20-25% and RV severely reduced.  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 Also consider stress cardiomyopathy from sepsis/PNA.  No significant CAD on 5/18 cath. Cardioverted back to NSR on 5/19.  GDMT started for heart failure. Discharged 06/03/21.   Post hospital follow up, NYHA II and euvolemic. Referred to [facility].   In July entresto/spiro stopped due to hperkalemia. BB and dig stopped due to bradycardia.   Today she returns for HF follow up. She was seen 3 weeks ago and dig + coreg stopped due to bradycardia. Overall feeling fine. Denies SOB/PND/Orthopnea.Rarely leaves her home. Walks in the house. Appetite ok. No fever or chills. Weight at home has been stable. Taking all medications. Her boyfriend is helping her with the medications. Smoking 1 PPD.   Social: Lives in trailer. She completed 7th grade. She has hard time reading labels.  She does not drive.   Labs (6/23): K 5.1, creatinine 0.90 Labs (08/05/21): K 4.9 Creatinine 1.42   Cardiac Testing   - Echo (5/23): moderate LV dilation with EF 20-25%, moderately decreased RV systolic function.   2017 Echo: 55-60%   - LHC (5/23): no significant CAD  - DC-CV (5/23)--> NSR    ROS: All systems negative except as listed in HPI, PMH and Problem List.  SH:  Social History   Socioeconomic History   Marital status: Divorced    Spouse name: Not on file   Number of children: Not on file   Years of education: Not on file   Highest education level: Not on file  Occupational History   Not on file  Tobacco Use   Smoking status: Every Day     Packs/day: 1.50    Years: 25.00    Total pack years: 37.50    Types: Cigarettes   Smokeless tobacco: Never  Substance and Sexual Activity   Alcohol use: No   Drug use: No   Sexual activity: Yes    Birth control/protection: Surgical  Other Topics Concern   Not on file  Social History Narrative   Not on file   Social Determinants of Health   Financial Resource Strain: Not on file  Food Insecurity: No Food Insecurity (06/11/2021)   Hunger Vital Sign    Worried About Running Out of Food in the Last Year: Never true    Ran Out of Food in the Last Year: Never true  Transportation Needs: No Transportation Needs (06/11/2021)   PRAPARE - Administrator, Civil Service (Medical): No    Lack of Transportation (Non-Medical): No  Physical Activity: Not on file  Stress: Not on file  Social Connections: Not on file  Intimate Partner Violence: Not on file    FH:  Family History  Problem Relation Age of Onset   Heart disease Other    Arthritis Other    Lung disease Other    Cancer Other    Asthma Other    Diabetes Other     Past Medical History:  Diagnosis Date   Anxiety    Back pain    Chronic back pain 02/04/13  By patient report   Depression    Factor 5 Leiden mutation, heterozygous (HCC)    Hypertension    Laryngitis, chronic    Panic attack    Scoliosis    Ulcer     Current Outpatient Medications  Medication Sig Dispense Refill   albuterol (PROVENTIL HFA;VENTOLIN HFA) 108 (90 BASE) MCG/ACT inhaler Inhale 2 puffs into the lungs every 4 (four) hours as needed for wheezing or shortness of breath. 1 Inhaler 0   amiodarone (PACERONE) 200 MG tablet Take 1 tablet (200 mg total) by mouth daily. 30 tablet 5   amitriptyline (ELAVIL) 25 MG tablet Take 25 mg by mouth at bedtime.     apixaban (ELIQUIS) 5 MG TABS tablet Take 1 tablet (5 mg total) by mouth 2 (two) times daily. 60 tablet 5   aspirin EC 81 MG EC tablet Take 1 tablet (81 mg total) by mouth daily.      atorvastatin (LIPITOR) 20 MG tablet Take 1 tablet (20 mg total) by mouth daily. 30 tablet 5   bismuth subsalicylate (PEPTO BISMOL) 262 MG chewable tablet Chew 524 mg by mouth 3 (three) times daily as needed for indigestion.     budesonide (PULMICORT) 0.25 MG/2ML nebulizer solution Take 2 mLs (0.25 mg total) by nebulization 2 (two) times daily. (Patient taking differently: Take 0.25 mg by nebulization 2 (two) times daily. As needed) 60 mL 12   celecoxib (CELEBREX) 200 MG capsule Take 200 mg by mouth daily.     citalopram (CELEXA) 20 MG tablet Take 20 mg by mouth daily.     dapagliflozin propanediol (FARXIGA) 10 MG TABS tablet Take 1 tablet (10 mg total) by mouth daily. 30 tablet 5   fluticasone (FLONASE) 50 MCG/ACT nasal spray Place 2 sprays into both nostrils as needed for allergies or rhinitis.     ipratropium-albuterol (DUONEB) 0.5-2.5 (3) MG/3ML SOLN Take 3 mLs by nebulization every 6 (six) hours as needed. 360 mL 0   magnesium oxide (MAG-OX) 400 MG tablet Take 400 mg by mouth daily.     pantoprazole (PROTONIX) 40 MG tablet Take 40 mg by mouth daily.     pregabalin (LYRICA) 200 MG capsule Take 200 mg by mouth 3 (three) times daily.     revefenacin (YUPELRI) 175 MCG/3ML nebulizer solution Take 3 mLs (175 mcg total) by nebulization daily. 90 mL 0   sucralfate (CARAFATE) 1 g tablet Take 1 tablet (1 g total) by mouth 2 (two) times daily. 30 tablet 0   Vitamin D, Ergocalciferol, (DRISDOL) 1.25 MG (50000 UNIT) CAPS capsule Take 50,000 Units by mouth 2 (two) times a week.     vortioxetine HBr (TRINTELLIX) 10 MG TABS tablet Take 10 mg by mouth daily.     No current facility-administered medications for this encounter.   BP 118/78   Pulse 77   Wt 93.2 kg (205 lb 6.4 oz)   SpO2 93%   BMI 36.38 kg/m   Wt Readings from Last 3 Encounters:  09/04/21 93.2 kg (205 lb 6.4 oz)  08/01/21 93.3 kg (205 lb 9.6 oz)  07/02/21 93 kg (205 lb)   PHYSICAL EXAM: General:  Well appearing. No resp  difficulty HEENT: normal Neck: supple. no JVD. Carotids 2+ bilat; no bruits. No lymphadenopathy or thryomegaly appreciated. Cor: PMI nondisplaced. Regular rate & rhythm. No rubs, gallops or murmurs. Lungs: clear Abdomen: soft, nontender, nondistended. No hepatosplenomegaly. No bruits or masses. Good bowel sounds. Extremities: no cyanosis, clubbing, rash, edema Neuro: alert & orientedx3, cranial nerves grossly  intact. moves all 4 extremities w/o difficulty. Affect pleasant   ASSESSMENT & PLAN: 1. HFrEF  Nonischemic CMP.  No prior history of CHF.  Echo 05/2021  showed LV EF < 20% with moderate LV dilation, severely decreased RV systolic function, moderate TR, normal IVC.  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 Also consider stress cardiomyopathy from sepsis/PNA.  No significant CAD on 5/23 cath.  - NYHA II-Volume status stable.  - Hold off on bb. I am concerned about her medications. Needs HF Paramedicine if possible.  - Off dig with elevated level.  - Off entresto/spiro due to hyperkalemia - Continue dapagliflozin 10 mg daily.  - Repeat ECHO in 8 weeks.    2. PAF - S/P DC-CV 05/2021 with restoration of SR. - Continue amio 200 mg daily.  - Continue Eliquis 5 mg bid.  3. Substance Abuse  - 05/2021 UDS + with methamphetamines. - Discussed cessation. She denies further use.  4. Tobacco Abuse - Discussed smoking cessation.   Difficult to make medication changes without her pill box. Next visit she was asked to bring all medications and see Pharmacy. Would like to try and restart low dose bb.  Follow up Dr Shirlee Latch in 8 weeks with an ECHO. She is at high risk for re admit due to poor insight. Will try to resume HF Paramedicine.      Mandeep Kiser NP-C  2:33 PM

## 2021-09-23 ENCOUNTER — Inpatient Hospital Stay (HOSPITAL_COMMUNITY): Admission: RE | Admit: 2021-09-23 | Payer: Medicaid Other | Source: Ambulatory Visit

## 2021-09-30 ENCOUNTER — Other Ambulatory Visit (HOSPITAL_COMMUNITY): Payer: Medicaid Other

## 2021-10-03 DIAGNOSIS — Z6837 Body mass index (BMI) 37.0-37.9, adult: Secondary | ICD-10-CM | POA: Diagnosis not present

## 2021-10-03 DIAGNOSIS — Z79899 Other long term (current) drug therapy: Secondary | ICD-10-CM | POA: Diagnosis not present

## 2021-10-03 DIAGNOSIS — F17210 Nicotine dependence, cigarettes, uncomplicated: Secondary | ICD-10-CM | POA: Diagnosis not present

## 2021-10-03 DIAGNOSIS — F341 Dysthymic disorder: Secondary | ICD-10-CM | POA: Diagnosis not present

## 2021-10-03 DIAGNOSIS — F411 Generalized anxiety disorder: Secondary | ICD-10-CM | POA: Diagnosis not present

## 2021-10-03 DIAGNOSIS — G4700 Insomnia, unspecified: Secondary | ICD-10-CM | POA: Diagnosis not present

## 2021-10-06 ENCOUNTER — Inpatient Hospital Stay (HOSPITAL_COMMUNITY): Admission: RE | Admit: 2021-10-06 | Payer: Medicaid Other | Source: Ambulatory Visit

## 2021-11-05 ENCOUNTER — Encounter (HOSPITAL_COMMUNITY): Payer: Medicaid Other | Admitting: Cardiology

## 2021-11-05 ENCOUNTER — Ambulatory Visit (HOSPITAL_COMMUNITY): Payer: Medicaid Other

## 2021-12-02 ENCOUNTER — Encounter (HOSPITAL_COMMUNITY): Payer: Medicaid Other

## 2021-12-02 ENCOUNTER — Ambulatory Visit (HOSPITAL_COMMUNITY): Payer: Medicaid Other

## 2021-12-22 NOTE — Progress Notes (Signed)
PCP: Dr Sudie Bailey HF Cardiologist: Dr Shirlee Latch   HPI: [name] is a 51 year old with a h/o chronic pain, anxiety, depression, substance abuse, A flutter, and HFrEF.     Admitted 05/16/21 with respiratory failure + A flutter. UDS + benzos & meth. Requiring several days of mechanical inhalation.  Echo with newly reduced EF 20-25% and RV severely reduced.  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 Also consider stress cardiomyopathy from sepsis/PNA.  No significant CAD on 5/18 cath. Cardioverted back to NSR on 5/19.  GDMT started for heart failure. Discharged 06/03/21.   Post hospital follow up, NYHA II and euvolemic. Referred to [facility].   In July entresto/spiro stopped due to hperkalemia. BB and dig stopped due to bradycardia.   Today she returns for HF follow up. She was seen 3 weeks ago and dig + coreg stopped due to bradycardia. Overall feeling fine. Denies SOB/PND/Orthopnea.Rarely leaves her home. Walks in the house. Appetite ok. No fever or chills. Weight at home has been stable. Taking all medications. Her boyfriend is helping her with the medications. Smoking 1 PPD.   Social: Lives in trailer. She completed 7th grade. She has hard time reading labels.  She does not drive.   Labs (6/23): K 5.1, creatinine 0.90 Labs (08/05/21): K 4.9 Creatinine 1.42   Cardiac Testing   - Echo (5/23): moderate LV dilation with EF 20-25%, moderately decreased RV systolic function.   2017 Echo: 55-60%   - LHC (5/23): no significant CAD  - DC-CV (5/23)--> NSR    ROS: All systems negative except as listed in HPI, PMH and Problem List.  SH:  Social History   Socioeconomic History   Marital status: Divorced    Spouse name: Not on file   Number of children: Not on file   Years of education: Not on file   Highest education level: Not on file  Occupational History   Not on file  Tobacco Use   Smoking status: Every Day     Packs/day: 1.50    Years: 25.00    Total pack years: 37.50    Types: Cigarettes   Smokeless tobacco: Never  Substance and Sexual Activity   Alcohol use: No   Drug use: No   Sexual activity: Yes    Birth control/protection: Surgical  Other Topics Concern   Not on file  Social History Narrative   Not on file   Social Determinants of Health   Financial Resource Strain: Not on file  Food Insecurity: No Food Insecurity (06/11/2021)   Hunger Vital Sign    Worried About Running Out of Food in the Last Year: Never true    Ran Out of Food in the Last Year: Never true  Transportation Needs: No Transportation Needs (06/11/2021)   PRAPARE - Administrator, Civil Service (Medical): No    Lack of Transportation (Non-Medical): No  Physical Activity: Not on file  Stress: Not on file  Social Connections: Not on file  Intimate Partner Violence: Not on file    FH:  Family History  Problem Relation Age of Onset   Heart disease Other    Arthritis Other    Lung disease Other    Cancer Other    Asthma Other    Diabetes Other     Past Medical History:  Diagnosis Date   Anxiety    Back pain    Chronic back pain 02/04/13  By patient report   Depression    Factor 5 Leiden mutation, heterozygous (HCC)    Hypertension    Laryngitis, chronic    Panic attack    Scoliosis    Ulcer     Current Outpatient Medications  Medication Sig Dispense Refill   albuterol (PROVENTIL HFA;VENTOLIN HFA) 108 (90 BASE) MCG/ACT inhaler Inhale 2 puffs into the lungs every 4 (four) hours as needed for wheezing or shortness of breath. 1 Inhaler 0   amiodarone (PACERONE) 200 MG tablet Take 1 tablet (200 mg total) by mouth daily. 30 tablet 5   amitriptyline (ELAVIL) 25 MG tablet Take 25 mg by mouth at bedtime.     apixaban (ELIQUIS) 5 MG TABS tablet Take 1 tablet (5 mg total) by mouth 2 (two) times daily. 60 tablet 5   aspirin EC 81 MG EC tablet Take 1 tablet (81 mg total) by mouth daily.      atorvastatin (LIPITOR) 20 MG tablet Take 1 tablet (20 mg total) by mouth daily. 30 tablet 5   bismuth subsalicylate (PEPTO BISMOL) 262 MG chewable tablet Chew 524 mg by mouth 3 (three) times daily as needed for indigestion.     budesonide (PULMICORT) 0.25 MG/2ML nebulizer solution Take 2 mLs (0.25 mg total) by nebulization 2 (two) times daily. (Patient taking differently: Take 0.25 mg by nebulization 2 (two) times daily. As needed) 60 mL 12   celecoxib (CELEBREX) 200 MG capsule Take 200 mg by mouth daily.     citalopram (CELEXA) 20 MG tablet Take 20 mg by mouth daily.     dapagliflozin propanediol (FARXIGA) 10 MG TABS tablet Take 1 tablet (10 mg total) by mouth daily. 30 tablet 5   fluticasone (FLONASE) 50 MCG/ACT nasal spray Place 2 sprays into both nostrils as needed for allergies or rhinitis.     ipratropium-albuterol (DUONEB) 0.5-2.5 (3) MG/3ML SOLN Take 3 mLs by nebulization every 6 (six) hours as needed. 360 mL 0   magnesium oxide (MAG-OX) 400 MG tablet Take 400 mg by mouth daily.     pantoprazole (PROTONIX) 40 MG tablet Take 40 mg by mouth daily.     pregabalin (LYRICA) 200 MG capsule Take 200 mg by mouth 3 (three) times daily.     revefenacin (YUPELRI) 175 MCG/3ML nebulizer solution Take 3 mLs (175 mcg total) by nebulization daily. 90 mL 0   sucralfate (CARAFATE) 1 g tablet Take 1 tablet (1 g total) by mouth 2 (two) times daily. 30 tablet 0   Vitamin D, Ergocalciferol, (DRISDOL) 1.25 MG (50000 UNIT) CAPS capsule Take 50,000 Units by mouth 2 (two) times a week.     vortioxetine HBr (TRINTELLIX) 10 MG TABS tablet Take 10 mg by mouth daily.     No current facility-administered medications for this visit.   There were no vitals taken for this visit.  Wt Readings from Last 3 Encounters:  09/04/21 93.2 kg (205 lb 6.4 oz)  08/01/21 93.3 kg (205 lb 9.6 oz)  07/02/21 93 kg (205 lb)   PHYSICAL EXAM: General:  Well appearing. No resp difficulty HEENT: normal Neck: supple. no JVD. Carotids 2+  bilat; no bruits. No lymphadenopathy or thryomegaly appreciated. Cor: PMI nondisplaced. Regular rate & rhythm. No rubs, gallops or murmurs. Lungs: clear Abdomen: soft, nontender, nondistended. No hepatosplenomegaly. No bruits or masses. Good bowel sounds. Extremities: no cyanosis, clubbing, rash, edema Neuro: alert & orientedx3, cranial nerves grossly intact. moves all 4 extremities w/o difficulty. Affect pleasant   ASSESSMENT & PLAN: 1. HFrEF  Nonischemic CMP.  No prior history of CHF.  Echo 05/2021  showed LV EF < 20% with moderate LV dilation, severely decreased RV systolic function, moderate TR, normal IVC.  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 Also consider stress cardiomyopathy from sepsis/PNA.  No significant CAD on 5/23 cath.  - NYHA II-Volume status stable.  - Hold off on bb. I am concerned about her medications. Needs HF Paramedicine if possible.  - Off dig with elevated level.  - Off entresto/spiro due to hyperkalemia - Continue dapagliflozin 10 mg daily.  - Repeat ECHO in 8 weeks.    2. PAF - S/P DC-CV 05/2021 with restoration of SR. - Continue amio 200 mg daily.  - Continue Eliquis 5 mg bid.  3. Substance Abuse  - 05/2021 UDS + with methamphetamines. - Discussed cessation. She denies further use.  4. Tobacco Abuse - Discussed smoking cessation.   Difficult to make medication changes without her pill box. Next visit she was asked to bring all medications and see Pharmacy. Would like to try and restart low dose bb.  Follow up Dr Shirlee Latch in 8 weeks with an ECHO. She is at high risk for re admit due to poor insight. Will try to resume HF Paramedicine.      [place] Celestine Bougie NP-C  3:22 PM

## 2021-12-23 ENCOUNTER — Ambulatory Visit (HOSPITAL_BASED_OUTPATIENT_CLINIC_OR_DEPARTMENT_OTHER)
Admission: RE | Admit: 2021-12-23 | Discharge: 2021-12-23 | Disposition: A | Discharge status: Home / Self Care | Payer: Medicaid Other | Source: Ambulatory Visit | Attending: Family Medicine | Admitting: Family Medicine

## 2021-12-23 ENCOUNTER — Encounter (HOSPITAL_COMMUNITY): Payer: Self-pay

## 2021-12-23 ENCOUNTER — Other Ambulatory Visit (HOSPITAL_COMMUNITY): Payer: Self-pay

## 2021-12-23 ENCOUNTER — Ambulatory Visit (HOSPITAL_COMMUNITY)
Admission: RE | Admit: 2021-12-23 | Discharge: 2021-12-23 | Disposition: A | Discharge status: Home / Self Care | Payer: Medicaid Other | Source: Ambulatory Visit | Attending: Family Medicine | Admitting: Family Medicine

## 2021-12-23 VITALS — BP 120/82 | HR 63 | Wt 216.0 lb

## 2021-12-23 DIAGNOSIS — I110 Hypertensive heart disease with heart failure: Secondary | ICD-10-CM | POA: Diagnosis not present

## 2021-12-23 DIAGNOSIS — Z720 Tobacco use: Secondary | ICD-10-CM | POA: Diagnosis not present

## 2021-12-23 DIAGNOSIS — F32A Depression, unspecified: Secondary | ICD-10-CM | POA: Insufficient documentation

## 2021-12-23 DIAGNOSIS — I428 Other cardiomyopathies: Secondary | ICD-10-CM | POA: Diagnosis not present

## 2021-12-23 DIAGNOSIS — I5022 Chronic systolic (congestive) heart failure: Secondary | ICD-10-CM

## 2021-12-23 DIAGNOSIS — F17210 Nicotine dependence, cigarettes, uncomplicated: Secondary | ICD-10-CM | POA: Diagnosis not present

## 2021-12-23 DIAGNOSIS — F1910 Other psychoactive substance abuse, uncomplicated: Secondary | ICD-10-CM

## 2021-12-23 DIAGNOSIS — G8929 Other chronic pain: Secondary | ICD-10-CM | POA: Diagnosis not present

## 2021-12-23 DIAGNOSIS — I5020 Unspecified systolic (congestive) heart failure: Secondary | ICD-10-CM

## 2021-12-23 DIAGNOSIS — Z79899 Other long term (current) drug therapy: Secondary | ICD-10-CM | POA: Insufficient documentation

## 2021-12-23 DIAGNOSIS — I480 Paroxysmal atrial fibrillation: Secondary | ICD-10-CM

## 2021-12-23 DIAGNOSIS — Z7901 Long term (current) use of anticoagulants: Secondary | ICD-10-CM | POA: Insufficient documentation

## 2021-12-23 DIAGNOSIS — I4892 Unspecified atrial flutter: Secondary | ICD-10-CM | POA: Insufficient documentation

## 2021-12-23 LAB — ECHOCARDIOGRAM COMPLETE
AR max vel: 2.96 cm2
AV Area VTI: 2.8 cm2
AV Area mean vel: 2.75 cm2
AV Mean grad: 5 mmHg
AV Peak grad: 9.5 mmHg
Ao pk vel: 1.54 m/s
Area-P 1/2: 3.12 cm2
Calc EF: 54 %
MV M vel: 1.82 m/s
MV Peak grad: 13.2 mmHg
S' Lateral: 3.5 cm
Single Plane A2C EF: 50.4 %
Single Plane A4C EF: 54.3 %

## 2021-12-23 MED ORDER — AMIODARONE HCL 200 MG PO TABS: 100.0000 mg | ORAL_TABLET | Freq: Every day | ORAL | 5 refills | Status: DC

## 2021-12-23 MED ORDER — FUROSEMIDE 20 MG PO TABS
40.0000 mg | ORAL_TABLET | ORAL | 3 refills | Status: DC | PRN
Start: 2021-12-23 — End: 2022-12-14

## 2021-12-23 NOTE — Progress Notes (Signed)
  Echocardiogram 2D Echocardiogram has been performed.  [name] 12/23/2021, 11:59 AM

## 2021-12-23 NOTE — Patient Instructions (Addendum)
Thank you for coming in today  EKG was done today  Labs were done today, if any labs are abnormal the clinic will call you No news is good news   Start Lasix 40 mg as needed For weight gain of 3 lbs in 24 hours or 5 lbs in a week DECREASE Amiodarone to 100 mg 1/2 tablet daily    Your physician recommends that you schedule a follow-up appointment in:  3 months with Dr. Shirlee Latch   Do the following things EVERYDAY: Weigh yourself in the morning before breakfast. Write it down and keep it in a log. Take your medicines as prescribed Eat low salt foods--Limit salt (sodium) to 2000 mg per day.  Stay as active as you can everyday Limit all fluids for the day to less than 2 liters   At the [facility], you and your health needs are our priority. As part of our continuing mission to provide you with exceptional heart care, we have created designated Provider Care Teams. These Care Teams include your primary Cardiologist (physician) and Advanced Practice Providers (APPs- Physician Assistants and Nurse Practitioners) who all work together to provide you with the care you need, when you need it.   You may see any of the following providers on your designated Care Team at your next follow up: Dr Arvilla Meres Dr Marca Ancona Dr. Marcos Eke, NP Robbie Lis, [place] [facility] [place], [place] Brynda Peon, NP Karle Plumber, PharmD   Please be sure to bring in all your medications bottles to every appointment.   If you have any questions or concerns before your next appointment please send [place] a message through [place] or call our office at [phone].    TO LEAVE A MESSAGE FOR THE NURSE SELECT OPTION 2, PLEASE LEAVE A MESSAGE INCLUDING: YOUR NAME DATE OF BIRTH CALL BACK NUMBER REASON FOR CALL**this is important as we prioritize the call backs  YOU WILL RECEIVE A CALL BACK THE SAME DAY AS LONG AS YOU CALL BEFORE 4:00 PM

## 2021-12-23 NOTE — Progress Notes (Signed)
ReDS Vest / Clip - 12/23/21 1213       ReDS Vest / Clip   Station Marker B    Ruler Value 36    ReDS Value Range Low volume    ReDS Actual Value 32    Anatomical Comments sitting

## 2022-01-17 ENCOUNTER — Other Ambulatory Visit (HOSPITAL_COMMUNITY): Payer: Self-pay | Admitting: Cardiology

## 2022-01-27 ENCOUNTER — Other Ambulatory Visit (HOSPITAL_COMMUNITY): Payer: Self-pay | Admitting: Cardiology

## 2022-02-25 DIAGNOSIS — Z79899 Other long term (current) drug therapy: Secondary | ICD-10-CM | POA: Diagnosis not present

## 2022-02-25 DIAGNOSIS — F411 Generalized anxiety disorder: Secondary | ICD-10-CM | POA: Diagnosis not present

## 2022-02-25 DIAGNOSIS — R030 Elevated blood-pressure reading, without diagnosis of hypertension: Secondary | ICD-10-CM | POA: Diagnosis not present

## 2022-02-25 DIAGNOSIS — Z6839 Body mass index (BMI) 39.0-39.9, adult: Secondary | ICD-10-CM | POA: Diagnosis not present

## 2022-02-25 DIAGNOSIS — G4700 Insomnia, unspecified: Secondary | ICD-10-CM | POA: Diagnosis not present

## 2022-02-25 DIAGNOSIS — F341 Dysthymic disorder: Secondary | ICD-10-CM | POA: Diagnosis not present

## 2022-02-25 DIAGNOSIS — F17210 Nicotine dependence, cigarettes, uncomplicated: Secondary | ICD-10-CM | POA: Diagnosis not present

## 2022-03-10 ENCOUNTER — Other Ambulatory Visit (HOSPITAL_COMMUNITY): Payer: Self-pay | Admitting: Cardiology

## 2022-03-16 ENCOUNTER — Encounter (HOSPITAL_COMMUNITY): Payer: Medicaid Other | Admitting: Cardiology

## 2022-03-31 ENCOUNTER — Other Ambulatory Visit (HOSPITAL_COMMUNITY): Payer: Self-pay | Admitting: Cardiology

## 2022-03-31 DIAGNOSIS — I5020 Unspecified systolic (congestive) heart failure: Secondary | ICD-10-CM

## 2022-04-07 ENCOUNTER — Other Ambulatory Visit (HOSPITAL_COMMUNITY): Payer: Self-pay | Admitting: Nurse Practitioner

## 2022-04-07 DIAGNOSIS — Z124 Encounter for screening for malignant neoplasm of cervix: Secondary | ICD-10-CM | POA: Diagnosis not present

## 2022-04-07 DIAGNOSIS — Z1231 Encounter for screening mammogram for malignant neoplasm of breast: Secondary | ICD-10-CM

## 2022-04-07 DIAGNOSIS — F17200 Nicotine dependence, unspecified, uncomplicated: Secondary | ICD-10-CM | POA: Diagnosis not present

## 2022-04-07 DIAGNOSIS — Z0001 Encounter for general adult medical examination with abnormal findings: Secondary | ICD-10-CM | POA: Diagnosis not present

## 2022-04-07 DIAGNOSIS — E782 Mixed hyperlipidemia: Secondary | ICD-10-CM | POA: Diagnosis not present

## 2022-04-07 DIAGNOSIS — I480 Paroxysmal atrial fibrillation: Secondary | ICD-10-CM | POA: Diagnosis not present

## 2022-04-16 ENCOUNTER — Other Ambulatory Visit (HOSPITAL_COMMUNITY): Payer: Self-pay | Admitting: Cardiology

## 2022-04-16 ENCOUNTER — Other Ambulatory Visit (HOSPITAL_COMMUNITY): Payer: Self-pay | Admitting: Family Medicine

## 2022-04-21 ENCOUNTER — Other Ambulatory Visit (HOSPITAL_COMMUNITY): Payer: Self-pay | Admitting: Cardiology

## 2022-04-22 MED ORDER — ATORVASTATIN CALCIUM 20 MG PO TABS: 20.0000 mg | ORAL_TABLET | Freq: Every day | ORAL | 0 refills | Status: DC

## 2022-04-25 DIAGNOSIS — Z1212 Encounter for screening for malignant neoplasm of rectum: Secondary | ICD-10-CM | POA: Diagnosis not present

## 2022-04-25 DIAGNOSIS — Z1211 Encounter for screening for malignant neoplasm of colon: Secondary | ICD-10-CM | POA: Diagnosis not present

## 2022-05-01 ENCOUNTER — Other Ambulatory Visit (HOSPITAL_COMMUNITY): Payer: Self-pay

## 2022-05-05 ENCOUNTER — Other Ambulatory Visit (HOSPITAL_COMMUNITY): Payer: Self-pay | Admitting: Nurse Practitioner

## 2022-05-05 ENCOUNTER — Encounter (HOSPITAL_COMMUNITY): Payer: Self-pay

## 2022-05-05 DIAGNOSIS — Z1231 Encounter for screening mammogram for malignant neoplasm of breast: Secondary | ICD-10-CM

## 2022-05-06 ENCOUNTER — Encounter: Payer: 59 | Admitting: Obstetrics and Gynecology

## 2022-05-11 ENCOUNTER — Ambulatory Visit (HOSPITAL_COMMUNITY)
Admission: RE | Admit: 2022-05-11 | Discharge: 2022-05-11 | Disposition: A | Discharge status: Home / Self Care | Payer: 59 | Source: Ambulatory Visit | Attending: Nurse Practitioner | Admitting: Nurse Practitioner

## 2022-05-11 ENCOUNTER — Encounter (HOSPITAL_COMMUNITY): Payer: Self-pay | Admitting: Radiology

## 2022-05-11 DIAGNOSIS — Z1231 Encounter for screening mammogram for malignant neoplasm of breast: Secondary | ICD-10-CM | POA: Insufficient documentation

## 2022-05-20 ENCOUNTER — Telehealth (HOSPITAL_COMMUNITY): Payer: Self-pay | Admitting: Vascular Surgery

## 2022-05-20 NOTE — Telephone Encounter (Signed)
Lvm to move 5/10 appt w/ Mclean he will be out of office

## 2022-05-22 ENCOUNTER — Ambulatory Visit (HOSPITAL_COMMUNITY): Payer: Medicaid Other

## 2022-05-22 ENCOUNTER — Encounter (HOSPITAL_COMMUNITY): Payer: Medicaid Other | Admitting: Cardiology

## 2022-05-30 ENCOUNTER — Other Ambulatory Visit (HOSPITAL_COMMUNITY): Payer: Self-pay | Admitting: Cardiology

## 2022-05-31 ENCOUNTER — Other Ambulatory Visit (HOSPITAL_COMMUNITY): Payer: Self-pay | Admitting: Cardiology

## 2022-06-01 ENCOUNTER — Other Ambulatory Visit (HOSPITAL_COMMUNITY): Payer: Self-pay

## 2022-06-01 MED ORDER — SPIRONOLACTONE 25 MG PO TABS: 25.0000 mg | ORAL_TABLET | Freq: Every day | ORAL | 0 refills | Status: DC

## 2022-06-01 MED ORDER — ATORVASTATIN CALCIUM 20 MG PO TABS: 20.0000 mg | ORAL_TABLET | Freq: Every day | ORAL | 0 refills | Status: DC

## 2022-06-01 MED ORDER — DAPAGLIFLOZIN PROPANEDIOL 10 MG PO TABS: 10.0000 mg | ORAL_TABLET | Freq: Every day | ORAL | 0 refills | Status: DC

## 2022-06-01 MED ORDER — ENTRESTO 24-26 MG PO TABS: ORAL_TABLET | ORAL | 0 refills | Status: DC

## 2022-06-16 ENCOUNTER — Other Ambulatory Visit (HOSPITAL_COMMUNITY): Payer: Self-pay | Admitting: Cardiology

## 2022-06-16 MED ORDER — SPIRONOLACTONE 25 MG PO TABS: 25.0000 mg | ORAL_TABLET | Freq: Every day | ORAL | 3 refills | Status: DC

## 2022-06-16 MED ORDER — ATORVASTATIN CALCIUM 20 MG PO TABS: 20.0000 mg | ORAL_TABLET | Freq: Every day | ORAL | 3 refills | Status: DC

## 2022-06-18 ENCOUNTER — Encounter: Payer: Self-pay | Admitting: Adult Health

## 2022-06-18 ENCOUNTER — Ambulatory Visit: Payer: Medicaid Other | Admitting: Adult Health

## 2022-06-18 ENCOUNTER — Other Ambulatory Visit (HOSPITAL_COMMUNITY)
Admission: RE | Admit: 2022-06-18 | Discharge: 2022-06-18 | Disposition: A | Discharge status: Home / Self Care | Payer: Medicaid Other | Source: Ambulatory Visit | Attending: Adult Health | Admitting: Adult Health

## 2022-06-18 VITALS — BP 117/67 | HR 75 | Ht 62.0 in | Wt 227.0 lb

## 2022-06-18 DIAGNOSIS — Z124 Encounter for screening for malignant neoplasm of cervix: Secondary | ICD-10-CM

## 2022-06-18 DIAGNOSIS — N950 Postmenopausal bleeding: Secondary | ICD-10-CM

## 2022-06-18 NOTE — Progress Notes (Signed)
  Subjective:     Patient ID: [name], female   DOB: 1970-05-27, 52 y.o.   MRN: [id number]  HPI Manpreet is a 52 year old white female, divorced, PM in for complaints of vaginal bleeding.  PCP is Dr Sudie Bailey  Review of Systems +vaginal bleeding +hot flashes    No sex in about 4 years Reviewed past medical,surgical, social and family history. Reviewed medications and allergies.  Objective:   Physical Exam BP 117/67 (BP Location: Right Arm, Patient Position: Sitting, Cuff Size: Large)   Pulse 75   Ht 5\' 2"  (1.575 m)   Wt 227 lb (103 kg)   LMP 09/14/2017 Comment: PT STATES SHE HAD HER TUBES CUT AND BURNT  BMI 41.52 kg/m  Skin warm and dry. Lungs: clear to ausculation bilaterally. Cardiovascular: regular rate and rhythm.    Pelvic: external genitalia is normal in appearance no lesions, vagina: pale,urethra has no lesions or masses noted, cervix:smooth, pap with GC/CHL and HR HPV genotyping performed, uterus: normal size, shape and contour, non tender, no masses felt, adnexa: no masses or tenderness noted. Bladder is non tender and no masses felt.  AA is 0 Fall risk is moderate    06/18/2022    2:17 PM  Depression screen PHQ 2/9  Decreased Interest 1  Down, Depressed, Hopeless 2  PHQ - 2 Score 3  Altered sleeping 1  Tired, decreased energy 1  Change in appetite 1  Feeling bad or failure about yourself  2  Trouble concentrating 1  PHQ-9 Score 9   She is on meds    06/18/2022    2:18 PM  GAD 7 : Generalized Anxiety Score  Nervous, Anxious, on Edge 2  Control/stop worrying 1  Worry too much - different things 2  Trouble relaxing 1  Restless 1  Easily annoyed or irritable 1  Afraid - awful might happen 1  Total GAD 7 Score 9      Upstream - 06/18/22 1454       Pregnancy Intention Screening   Does the patient want to become pregnant in the next year? N/A    Does the patient's partner want to become pregnant in the next year? N/A    Would the patient like to discuss  contraceptive options today? N/A      Contraception Wrap Up   Current Method Female Sterilization    End Method Female Sterilization    Contraception Counseling Provided No            Examination chaperoned by Malachy Mood LPN  Assessment:     1. PMB (postmenopausal bleeding) Had bleeding for about 2 months after last exam, with PCP Will get pelvic US 07/01/22 at [facility] at 3:30 pm to assess uterine lining for thickness She is aware that if thickened will need endometrial biopsy to rule out uterine cancer Review handout on PMB - US PELVIC COMPLETE WITH TRANSVAGINAL; Future  2. Routine Papanicolaou smear Pap sent Pap in 3 years if normal  - Cytology - PAP( [place])     Plan:     Follow up TBD once [place] results back

## 2022-06-22 DIAGNOSIS — G4700 Insomnia, unspecified: Secondary | ICD-10-CM | POA: Diagnosis not present

## 2022-06-22 DIAGNOSIS — Z6839 Body mass index (BMI) 39.0-39.9, adult: Secondary | ICD-10-CM | POA: Diagnosis not present

## 2022-06-22 DIAGNOSIS — Z79899 Other long term (current) drug therapy: Secondary | ICD-10-CM | POA: Diagnosis not present

## 2022-06-22 DIAGNOSIS — F17210 Nicotine dependence, cigarettes, uncomplicated: Secondary | ICD-10-CM | POA: Diagnosis not present

## 2022-06-22 DIAGNOSIS — F900 Attention-deficit hyperactivity disorder, predominantly inattentive type: Secondary | ICD-10-CM | POA: Diagnosis not present

## 2022-06-22 DIAGNOSIS — R030 Elevated blood-pressure reading, without diagnosis of hypertension: Secondary | ICD-10-CM | POA: Diagnosis not present

## 2022-06-22 DIAGNOSIS — F411 Generalized anxiety disorder: Secondary | ICD-10-CM | POA: Diagnosis not present

## 2022-06-22 DIAGNOSIS — F341 Dysthymic disorder: Secondary | ICD-10-CM | POA: Diagnosis not present

## 2022-06-29 LAB — CYTOLOGY - PAP
Chlamydia: NEGATIVE
Comment: NEGATIVE
Comment: NEGATIVE
Comment: NORMAL
Diagnosis: REACTIVE
High risk HPV: NEGATIVE
Neisseria Gonorrhea: NEGATIVE

## 2022-06-30 ENCOUNTER — Telehealth: Payer: Self-pay | Admitting: *Deleted

## 2022-06-30 ENCOUNTER — Ambulatory Visit: Payer: Medicaid Other | Admitting: Gastroenterology

## 2022-06-30 ENCOUNTER — Encounter: Payer: Self-pay | Admitting: Gastroenterology

## 2022-06-30 VITALS — BP 122/75 | HR 73 | Temp 97.6°F | Ht 62.0 in | Wt 225.0 lb

## 2022-06-30 DIAGNOSIS — R195 Other fecal abnormalities: Secondary | ICD-10-CM

## 2022-06-30 DIAGNOSIS — R7989 Other specified abnormal findings of blood chemistry: Secondary | ICD-10-CM | POA: Diagnosis not present

## 2022-06-30 DIAGNOSIS — K5900 Constipation, unspecified: Secondary | ICD-10-CM | POA: Insufficient documentation

## 2022-06-30 MED ORDER — LUBIPROSTONE 24 MCG PO CAPS: 24.0000 ug | ORAL_CAPSULE | Freq: Two times a day (BID) | ORAL | 5 refills | Status: DC

## 2022-06-30 NOTE — Progress Notes (Signed)
GI Office Note    Referring Provider: [name], MD Primary Care Physician:  [name], MD  Primary Gastroenterologist: Hennie Duos. Marletta Lor, DO   Chief Complaint   Chief Complaint  Patient presents with   Colonoscopy    Positive cologuard. Has issues with constipation.     History of Present Illness   [name] is a 52 y.o. female presenting today at the request of Dr. Sudie Bailey for positive cologuard. Also seeing gyn for postmenopausal bleeding. She has h/o Aflutter and HRrEF. Last seen in CHF clinic in 12/2021. Admitted 05/2021 with respiratory failure and Aflutter. UDS positive for benzos and meth. Requiring several days of mechanical inhalation. ECHO with newly reduced EF 20-25% and RV severely reduced. No significant CAD on 05/29/21 cath. Cardioverted back to NSR on 05/30/21. ECHO 12/2021 with EF of 55-60%. H/o blood clot in arm 2017. Has Factor 5 Leiden mutation, reported as heterozygous in epic, previously on ASA daily. On eliquis since 05/2021 for cardiac reasons.   Patient complains of chronic constipation. May have one BM a week. Lactose intolerance, sometimes drinks milk to go. Straining. Incomplete stools. Has tried miralax in the past. Does not use Pepto very often. Takes for heartburn or burning in stomach. States on carafate for several years for "stomach".    CT A/P with contrast 05/2020 IMPRESSION: 1. No acute abnormality in the abdomen or pelvis. 2. Similar apparent mild diffuse bladder wall thickening however the bladder is decompressed. Correlate with urinalysis to exclude cystitis. 3. Diffuse hepatic steatosis which can be a cause of abdominal pain. 4. Aortic atherosclerosis.     Medications   Current Outpatient Medications  Medication Sig Dispense Refill   albuterol (PROVENTIL HFA;VENTOLIN HFA) 108 (90 BASE) MCG/ACT inhaler Inhale 2 puffs into the lungs every 4 (four) hours as needed for wheezing or shortness of breath. 1 Inhaler 0    ALPRAZolam (XANAX) 0.5 MG tablet Take 0.5 mg by mouth 2 (two) times daily as needed.     amiodarone (PACERONE) 200 MG tablet Take 0.5 tablets (100 mg total) by mouth daily. 30 tablet 5   amitriptyline (ELAVIL) 25 MG tablet Take 25 mg by mouth at bedtime.     apixaban (ELIQUIS) 5 MG TABS tablet Take 1 tablet (5 mg total) by mouth 2 (two) times daily. (Patient taking differently: Take 5 mg by mouth daily.) 60 tablet 5   aspirin EC 81 MG EC tablet Take 1 tablet (81 mg total) by mouth daily.     atorvastatin (LIPITOR) 20 MG tablet Take 1 tablet (20 mg total) by mouth daily. 90 tablet 3   bismuth subsalicylate (PEPTO BISMOL) 262 MG chewable tablet Chew 524 mg by mouth 3 (three) times daily as needed for indigestion.     budesonide (PULMICORT) 0.25 MG/2ML nebulizer solution Take 2 mLs (0.25 mg total) by nebulization 2 (two) times daily. (Patient taking differently: Take 0.25 mg by nebulization 2 (two) times daily. As needed) 60 mL 12   celecoxib (CELEBREX) 200 MG capsule Take 200 mg by mouth daily.     citalopram (CELEXA) 20 MG tablet Take 20 mg by mouth daily.     dapagliflozin propanediol (FARXIGA) 10 MG TABS tablet Take 1 tablet (10 mg total) by mouth daily. 30 tablet 0   fluticasone (FLONASE) 50 MCG/ACT nasal spray Place 2 sprays into both nostrils as needed for allergies or rhinitis.     furosemide (LASIX) 20 MG tablet Take 2 tablets (40 mg total) by mouth as  needed. For weight gain of 3 lbs in 24 hours or 5 lbs in a week 90 tablet 3   ketoconazole (NIZORAL) 2 % shampoo Apply topically.     methocarbamol (ROBAXIN) 750 MG tablet SMARTSIG:1 Tablet(s) By Mouth 1-4 Times Daily     pantoprazole (PROTONIX) 40 MG tablet Take 40 mg by mouth daily.     pregabalin (LYRICA) 200 MG capsule Take 200 mg by mouth 3 (three) times daily.     revefenacin (YUPELRI) 175 MCG/3ML nebulizer solution Take 3 mLs (175 mcg total) by nebulization daily. 90 mL 0   sacubitril-valsartan (ENTRESTO) 24-26 MG TAKE (1) TABLET BY  MOUTH TWICE DAILY. 60 tablet 0   spironolactone (ALDACTONE) 25 MG tablet Take 1 tablet (25 mg total) by mouth daily. 90 tablet 3   sucralfate (CARAFATE) 1 g tablet Take 1 tablet (1 g total) by mouth 2 (two) times daily. 30 tablet 0   terbinafine (LAMISIL) 250 MG tablet Take by mouth.     Vitamin D, Ergocalciferol, (DRISDOL) 1.25 MG (50000 UNIT) CAPS capsule Take 50,000 Units by mouth 2 (two) times a week.     vortioxetine HBr (TRINTELLIX) 10 MG TABS tablet Take 10 mg by mouth daily.     VYVANSE 50 MG capsule Take 50 mg by mouth every morning.     zolpidem (AMBIEN) 10 MG tablet Take 10 mg by mouth at bedtime.     No current facility-administered medications for this visit.    Allergies   Allergies as of 06/30/2022 - Review Complete 06/30/2022  Allergen Reaction Noted   Penicillins Anaphylaxis 07/24/2010   Ibuprofen Nausea And Vomiting 10/23/2010   Aspirin Nausea And Vomiting 10/23/2010    Past Medical History   Past Medical History:  Diagnosis Date   A-fib [facility])    Anxiety    Back pain    Chronic back pain 02/04/2013   By patient report   COPD (chronic obstructive pulmonary disease) (HCC)    Depression    Factor 5 Leiden mutation, heterozygous (HCC)    GERD (gastroesophageal reflux disease)    Heart failure (HCC)    Hypertension    Laryngitis, chronic    Panic attack    Scoliosis    Ulcer     Past Surgical History   Past Surgical History:  Procedure Laterality Date   BACK SURGERY     CARDIOVERSION N/A 05/30/2021   Procedure: CARDIOVERSION;  Surgeon: Laurey Morale, MD;  Location: [facility] ENDOSCOPY;  Service: Cardiovascular;  Laterality: N/A;   CESAREAN SECTION     CHOLECYSTECTOMY  11/27/2011   Procedure: LAPAROSCOPIC CHOLECYSTECTOMY;  Surgeon: Fabio Bering, MD;  Location: [facility] ORS;  Service: General;  Laterality: N/A;   CRYOTHERAPY  02/17/2021   Procedure: CRYOTHERAPY;  Surgeon: Lorin Glass, MD;  Location: [facility] ENDOSCOPY;  Service: Pulmonary;;   EMBOLECTOMY Left  12/06/2015   Procedure: EMBOLECTOMY LEFT ARM;  Surgeon: Sherren Kerns, MD;  Location: [facility] OR;  Service: Vascular;  Laterality: Left;   LEFT HEART CATH AND CORONARY ANGIOGRAPHY N/A 05/29/2021   Procedure: LEFT HEART CATH AND CORONARY ANGIOGRAPHY;  Surgeon: Laurey Morale, MD;  Location: [facility] INVASIVE CV LAB;  Service: Cardiovascular;  Laterality: N/A;   NECK SURGERY     TEE WITHOUT CARDIOVERSION N/A 05/30/2021   Procedure: TRANSESOPHAGEAL ECHOCARDIOGRAM (TEE);  Surgeon: Laurey Morale, MD;  Location: [facility] ENDOSCOPY;  Service: Cardiovascular;  Laterality: N/A;   TUBAL LIGATION     VIDEO BRONCHOSCOPY Right 02/17/2021   Procedure: VIDEO BRONCHOSCOPY  WITHOUT FLUORO;  Surgeon: Lorin Glass, MD;  Location: [facility] ENDOSCOPY;  Service: Pulmonary;  Laterality: Right;  with cryo/ basket    Past Family History   Family History  Problem Relation Age of Onset   Other Sister        choked on a biscuit   Other Brother        brittle bone disease   Cancer Brother    Heart disease Other    Arthritis Other    Lung disease Other    Cancer Other    Asthma Other    Diabetes Other    Colon cancer Neg Hx     Past Social History   Social History   Socioeconomic History   Marital status: Divorced    Spouse name: Not on file   Number of children: Not on file   Years of education: Not on file   Highest education level: Not on file  Occupational History   Not on file  Tobacco Use   Smoking status: Every Day    Packs/day: 1.50    Years: 25.00    Additional pack years: 0.00    Total pack years: 37.50    Types: Cigarettes   Smokeless tobacco: Never  Vaping Use   Vaping Use: Never used  Substance and Sexual Activity   Alcohol use: No   Drug use: No   Sexual activity: Not Currently    Birth control/protection: Surgical    Comment: tubal  Other Topics Concern   Not on file  Social History Narrative   Not on file   Social Determinants of Health   Financial Resource Strain: Low Risk   (06/18/2022)   Overall Financial Resource Strain (CARDIA)    Difficulty of Paying Living Expenses: Not very hard  Food Insecurity: No Food Insecurity (06/18/2022)   Hunger Vital Sign    Worried About Running Out of Food in the Last Year: Never true    Ran Out of Food in the Last Year: Never true  Transportation Needs: No Transportation Needs (06/18/2022)   PRAPARE - Administrator, Civil Service (Medical): No    Lack of Transportation (Non-Medical): No  Physical Activity: Inactive (06/18/2022)   Exercise Vital Sign    Days of Exercise per Week: 0 days    Minutes of Exercise per Session: 0 min  Stress: Stress Concern Present (06/18/2022)   [employer] of Occupational Health - Occupational Stress Questionnaire    Feeling of Stress : To some extent  Social Connections: Socially Isolated (06/18/2022)   Social Connection and Isolation Panel [NHANES]    Frequency of Communication with Friends and Family: Twice a week    Frequency of Social Gatherings with Friends and Family: Never    Attends Religious Services: Never    Database administrator or Organizations: No    Attends Banker Meetings: Never    Marital Status: Living with partner  Intimate Partner Violence: Not At Risk (06/18/2022)   Humiliation, Afraid, Rape, and Kick questionnaire    Fear of Current or Ex-Partner: No    Emotionally Abused: No    Physically Abused: No    Sexually Abused: No    Review of Systems   General: Negative for anorexia, weight loss, fever, chills, fatigue, weakness. Eyes: Negative for vision changes.  ENT: Negative for hoarseness, difficulty swallowing , nasal congestion. CV: Negative for chest pain, angina, palpitations, dyspnea on exertion, peripheral edema.  Respiratory: Negative for dyspnea at rest, dyspnea  on exertion, cough, sputum, wheezing.  GI: See history of present illness. GU:  Negative for dysuria, hematuria, urinary incontinence, urinary frequency, nocturnal urination.  Some postmenopausal bleeding MS: Negative for joint pain, low back pain.  Derm: Negative for rash or itching.  Neuro: Negative for weakness, abnormal sensation, seizure, frequent headaches, memory loss,  confusion.  Psych: Negative for anxiety, depression, suicidal ideation, hallucinations.  Endo: Negative for unusual weight change.  Heme: Negative for bruising or bleeding. Allergy: Negative for rash or hives.  Physical Exam   BP 122/75 (BP Location: Right Arm, Patient Position: Sitting, Cuff Size: Large)   Pulse 73   Temp 97.6 F (36.4 C) (Oral)   Ht 5\' 2"  (1.575 m)   Wt 225 lb (102.1 kg)   LMP 09/14/2017 Comment: PT STATES SHE HAD HER TUBES CUT AND BURNT  SpO2 92%   BMI 41.15 kg/m    General: Well-nourished, well-developed in no acute distress.  Head: Normocephalic, atraumatic.   Eyes: Conjunctiva pink, no icterus. Mouth: Oropharyngeal mucosa moist and pink  Neck: Supple without thyromegaly, masses, or lymphadenopathy.  Lungs: Clear to auscultation bilaterally.  Heart: Regular rate and rhythm, no murmurs rubs or gallops.  Abdomen: Bowel sounds are normal, nontender, nondistended, no hepatosplenomegaly or masses,  no abdominal bruits or hernia, no rebound or guarding.   Rectal: not performed Extremities: No lower extremity edema. No clubbing or deformities.  Neuro: Alert and oriented x 4 , grossly normal neurologically.  Skin: Warm and dry, no rash or jaundice.   Psych: Alert and cooperative, normal mood and affect.  Labs   Lab Results  Component Value Date   CREATININE 1.42 (H) 08/05/2021   BUN 13 08/05/2021   NA 139 08/05/2021   K 4.9 08/05/2021   CL 103 08/05/2021   CO2 26 08/05/2021   Lab Results  Component Value Date   ALT 17 05/27/2021   AST 23 05/27/2021   GGT 120 (H) 02/18/2021   ALKPHOS 149 (H) 05/27/2021   BILITOT 0.7 05/27/2021   Lab Results  Component Value Date   WBC 8.5 06/11/2021   HGB 16.0 (H) 06/11/2021   HCT 48.5 (H) 06/11/2021   MCV  95.5 06/11/2021   PLT 263 06/11/2021   Lab Results  Component Value Date   LIPASE 24 02/14/2021  No results found for: "IRON", "TIBC", "FERRITIN"  05/17/21: Hep B surf Ag, HCV Ab, Hep A IgM, Hep B C IgM all NR Imaging Studies   No results found.  Assessment   Positive cologuard: needs colonoscopy   Constipation: add amitiza.   Elevated LFTs: noted while inpatient in 02/2021, AST/ALT/AP all abnormal. In 05/2021, alk phos predominantly elevated. Viral markers negative. Will recheck LFTs now, GGT, AMA. Last liver imaging in 2022 with hepatic steatosis.    PLAN   Colonoscopy with Dr. Marletta Lor. ASA 3.  I have discussed the risks, alternatives, benefits with regards to but not limited to the risk of reaction to medication, bleeding, infection, perforation and the patient is agreeable to proceed. Written consent to be obtained. She will need cardiac clearance and ok to hold Eliquis 48 hours before procedure.  LFTs, AMA, GGT.  Start amitiza bid.   Leanna Battles. Melvyn Neth, MHS, PA-C [facility]

## 2022-06-30 NOTE — Telephone Encounter (Signed)
  What type of surgery is being performed? COLONOSCOPY  When is surgery scheduled? TBD  What type of clearance is required (medical or pharmacy to hold medication or both? BOTH   Are there any medications that need to be held prior to surgery and how long? ELIQUIS X 48 HOURS  Name of physician performing surgery?  Dr. [name] [facility] Phone: [phone] Fax: [phone]  Anethesia type (none, local, MAC, general)? MAC

## 2022-06-30 NOTE — Patient Instructions (Signed)
Start Amitiza twice daily with food for constipation. Colonoscopy to be scheduled once we get approval from cardiology to hold your blood thinner and to proceed with procedure. Complete labs at [facility] for further evaluation of abnormal liver labs.

## 2022-06-30 NOTE — Telephone Encounter (Signed)
Patient with diagnosis of aflutter and heterozygous factor V leiden mutation with prior embolectomy of left arm noted in 2017 on Eliquis for anticoagulation.    Procedure: colonoscopy Date of procedure: TBD  CHA2DS2-VASc Score = 3  This indicates a 3.2% annual risk of stroke. The patient's score is based upon: CHF History: 1 HTN History: 1 Diabetes History: 0 Stroke History: 0 Vascular Disease History: 0 Age Score: 0 Gender Score: 1   CrCl 69mL/min using adjusted body weight due to obesity Platelet count 263K  Per office protocol, patient can hold Eliquis for 1-2 days prior to procedure.    **This guidance is not considered finalized until pre-operative APP has relayed final recommendations.**

## 2022-06-30 NOTE — Telephone Encounter (Signed)
   Patient Name: [name]  DOB: [birth year] MRN: [id number]  Primary Cardiologist: None  Clinical pharmacists have reviewed the patient's past medical history, labs, and current medications as part of preoperative protocol coverage. The following recommendations have been made:  Patient with diagnosis of aflutter and heterozygous factor V leiden mutation with prior embolectomy of left arm noted in 2017 on Eliquis for anticoagulation.     Procedure: colonoscopy Date of procedure: TBD   CHA2DS2-VASc Score = 3  This indicates a 3.2% annual risk of stroke. The patient's score is based upon: CHF History: 1 HTN History: 1 Diabetes History: 0 Stroke History: 0 Vascular Disease History: 0 Age Score: 0 Gender Score: 1   CrCl 59mL/min using adjusted body weight due to obesity Platelet count 263K   Per office protocol, patient can hold Eliquis for 1-2 days prior to procedure.  Please resume Eliquis as soon as possible postprocedure, at the discretion of the surgeon.   I will route this recommendation to the requesting party via Epic fax function and remove from pre-op pool.  Please call with questions.  Joylene Grapes, NP 06/30/2022, 4:44 PM

## 2022-07-01 ENCOUNTER — Ambulatory Visit (HOSPITAL_COMMUNITY)
Admission: RE | Admit: 2022-07-01 | Discharge: 2022-07-01 | Disposition: A | Discharge status: Home / Self Care | Payer: Medicaid Other | Source: Ambulatory Visit | Attending: Adult Health | Admitting: Adult Health

## 2022-07-01 DIAGNOSIS — N950 Postmenopausal bleeding: Secondary | ICD-10-CM | POA: Diagnosis not present

## 2022-07-02 NOTE — Telephone Encounter (Signed)
Mindy, do you have the fax received by cardiology or is it the same as this telephone note. I don't see anything specifically that she was cleared from a cardiac standpoint and she has pending echo next week

## 2022-07-03 NOTE — Telephone Encounter (Signed)
   Name: [name]  DOB: [birth year]  MRN: [id number]  Primary Cardiologist: None   Preoperative team, please contact this patient and set up a phone call appointment for further preoperative risk assessment. Please obtain consent and complete medication review. Thank you for your help.  I confirm that guidance regarding antiplatelet and oral anticoagulation therapy has been completed and, if necessary, noted below.  Recommendations for anticoagulation have been addressed.  Ronney Asters, NP 07/03/2022, 8:34 AM [facility]

## 2022-07-03 NOTE — Telephone Encounter (Signed)
Left message for the pt to call back to schedule a tele pre op appt, which will need to be after the echo on 07/10/22. I will update the requesting office as FYI.

## 2022-07-03 NOTE — Telephone Encounter (Signed)
Please advise regarding from cardiac standpoint

## 2022-07-06 NOTE — Telephone Encounter (Signed)
Pt has an echo 07/10/22, which then she will also see Dr. Shirlee Latch same day. I d/w the pre op APP Edd Fabian, FNP to defer clearance to appt with Dr. Shirlee Latch. I will update all parties involved.

## 2022-07-07 ENCOUNTER — Telehealth: Payer: Self-pay | Admitting: Adult Health

## 2022-07-07 ENCOUNTER — Encounter: Payer: Self-pay | Admitting: Gastroenterology

## 2022-07-07 DIAGNOSIS — N950 Postmenopausal bleeding: Secondary | ICD-10-CM

## 2022-07-07 NOTE — Telephone Encounter (Signed)
Left message that the uterine lining was thickened, and will need endometrial biopsy, please call office for endometrial biopsy appointment

## 2022-07-10 ENCOUNTER — Encounter (HOSPITAL_COMMUNITY): Payer: Self-pay

## 2022-07-10 ENCOUNTER — Encounter (HOSPITAL_COMMUNITY): Payer: Medicaid Other | Admitting: Cardiology

## 2022-07-10 ENCOUNTER — Ambulatory Visit (HOSPITAL_COMMUNITY): Admission: RE | Admit: 2022-07-10 | Payer: Medicaid Other | Source: Ambulatory Visit

## 2022-07-14 ENCOUNTER — Other Ambulatory Visit (HOSPITAL_COMMUNITY): Payer: Self-pay | Admitting: Cardiology

## 2022-07-14 ENCOUNTER — Other Ambulatory Visit: Payer: Medicaid Other | Admitting: Obstetrics & Gynecology

## 2022-07-15 ENCOUNTER — Other Ambulatory Visit (HOSPITAL_COMMUNITY): Payer: Self-pay | Admitting: Cardiology

## 2022-07-15 MED ORDER — ENTRESTO 24-26 MG PO TABS: ORAL_TABLET | ORAL | 0 refills | Status: DC

## 2022-07-20 DIAGNOSIS — F411 Generalized anxiety disorder: Secondary | ICD-10-CM | POA: Diagnosis not present

## 2022-07-20 DIAGNOSIS — Z6841 Body Mass Index (BMI) 40.0 and over, adult: Secondary | ICD-10-CM | POA: Diagnosis not present

## 2022-07-20 DIAGNOSIS — G4700 Insomnia, unspecified: Secondary | ICD-10-CM | POA: Diagnosis not present

## 2022-07-20 DIAGNOSIS — F909 Attention-deficit hyperactivity disorder, unspecified type: Secondary | ICD-10-CM | POA: Diagnosis not present

## 2022-07-20 DIAGNOSIS — F341 Dysthymic disorder: Secondary | ICD-10-CM | POA: Diagnosis not present

## 2022-07-20 DIAGNOSIS — F17210 Nicotine dependence, cigarettes, uncomplicated: Secondary | ICD-10-CM | POA: Diagnosis not present

## 2022-07-20 DIAGNOSIS — Z79899 Other long term (current) drug therapy: Secondary | ICD-10-CM | POA: Diagnosis not present

## 2022-07-24 NOTE — Telephone Encounter (Signed)
FYI pt was a no show for appts

## 2022-07-26 NOTE — Telephone Encounter (Signed)
Patient no showed cardiac appointments. Has not been cleared for GI procedures.   Patient has not completed labs ordered last month.  We should reach out to patient and let her know that we cannot proceed without GI work up until she has completed cardiac work up. Would request she complete labs.   She can call to schedule appt with [place] when she is ready to proceed with GI work up (of course assuming she has completed cardiac work up first).

## 2022-07-27 NOTE — Telephone Encounter (Signed)
Called pt, LMOVM to call back. Also sent mychart message.

## 2022-08-05 ENCOUNTER — Other Ambulatory Visit (HOSPITAL_COMMUNITY): Payer: Self-pay | Admitting: Cardiology

## 2022-08-07 DIAGNOSIS — K2100 Gastro-esophageal reflux disease with esophagitis, without bleeding: Secondary | ICD-10-CM | POA: Diagnosis not present

## 2022-08-07 DIAGNOSIS — M797 Fibromyalgia: Secondary | ICD-10-CM | POA: Diagnosis not present

## 2022-08-07 DIAGNOSIS — J984 Other disorders of lung: Secondary | ICD-10-CM | POA: Diagnosis not present

## 2022-08-07 DIAGNOSIS — I10 Essential (primary) hypertension: Secondary | ICD-10-CM | POA: Diagnosis not present

## 2022-08-13 ENCOUNTER — Ambulatory Visit: Payer: Medicaid Other | Admitting: Obstetrics & Gynecology

## 2022-08-13 ENCOUNTER — Encounter: Payer: Self-pay | Admitting: Obstetrics & Gynecology

## 2022-08-13 ENCOUNTER — Other Ambulatory Visit (HOSPITAL_COMMUNITY)
Admission: RE | Admit: 2022-08-13 | Discharge: 2022-08-13 | Disposition: A | Discharge status: Home / Self Care | Payer: Medicaid Other | Source: Ambulatory Visit | Attending: Obstetrics & Gynecology | Admitting: Obstetrics & Gynecology

## 2022-08-13 VITALS — BP 136/78 | HR 76

## 2022-08-13 DIAGNOSIS — N950 Postmenopausal bleeding: Secondary | ICD-10-CM | POA: Insufficient documentation

## 2022-08-13 DIAGNOSIS — R9389 Abnormal findings on diagnostic imaging of other specified body structures: Secondary | ICD-10-CM

## 2022-08-13 NOTE — Progress Notes (Signed)
Endometrial Biopsy Procedure Note  Pre-operative Diagnosis: Post menopausal bleeding with thickened endometrium(8.5 mm) on sonogram  Post-operative Diagnosis: same  Indications: postmenopausal bleeding  Procedure Details   Urine pregnancy test was not done.  The risks (including infection, bleeding, pain, and uterine perforation) and benefits of the procedure were explained to the patient and Written informed consent was obtained.  Antibiotic prophylaxis against endocarditis was not indicated.   The patient was placed in the dorsal lithotomy position.  Bimanual exam showed the uterus to be in the neutral position.  A Graves' speculum inserted in the vagina, and the cervix prepped with povidone iodine.  Endocervical curettage with a Kevorkian curette was not performed.   A sharp tenaculum was applied to the anterior lip of the cervix for stabilization.  A sterile uterine sound was used to sound the uterus to a depth of 6.5 cm.  A Pipelle endometrial aspirator was used to sample the endometrium.  Sample was sent for pathologic examination.  Condition: Stable  Complications: None  Plan:  The patient was advised to call for any fever or for prolonged or severe pain or bleeding. She was advised to use OTC analgesics as needed for mild to moderate pain. She was advised to avoid vaginal intercourse for 48 hours or until the bleeding has completely stopped.  Attending Physician Documentation: I was present for or performed the following: endometrial biopsy

## 2022-08-18 DIAGNOSIS — R195 Other fecal abnormalities: Secondary | ICD-10-CM | POA: Diagnosis not present

## 2022-08-18 DIAGNOSIS — K5900 Constipation, unspecified: Secondary | ICD-10-CM | POA: Diagnosis not present

## 2022-08-18 DIAGNOSIS — R7989 Other specified abnormal findings of blood chemistry: Secondary | ICD-10-CM | POA: Diagnosis not present

## 2022-08-20 ENCOUNTER — Telehealth (HOSPITAL_COMMUNITY): Payer: Self-pay

## 2022-08-20 ENCOUNTER — Encounter (HOSPITAL_COMMUNITY): Payer: Self-pay

## 2022-08-20 ENCOUNTER — Ambulatory Visit (HOSPITAL_COMMUNITY)
Admission: RE | Admit: 2022-08-20 | Discharge: 2022-08-20 | Disposition: A | Discharge status: Home / Self Care | Payer: Medicaid Other | Source: Ambulatory Visit | Attending: Physician Assistant | Admitting: Physician Assistant

## 2022-08-20 VITALS — BP 110/70 | HR 90 | Wt 213.6 lb

## 2022-08-20 DIAGNOSIS — Z720 Tobacco use: Secondary | ICD-10-CM

## 2022-08-20 DIAGNOSIS — F17210 Nicotine dependence, cigarettes, uncomplicated: Secondary | ICD-10-CM | POA: Diagnosis not present

## 2022-08-20 DIAGNOSIS — I5020 Unspecified systolic (congestive) heart failure: Secondary | ICD-10-CM

## 2022-08-20 DIAGNOSIS — F1510 Other stimulant abuse, uncomplicated: Secondary | ICD-10-CM | POA: Diagnosis not present

## 2022-08-20 DIAGNOSIS — I5022 Chronic systolic (congestive) heart failure: Secondary | ICD-10-CM | POA: Insufficient documentation

## 2022-08-20 DIAGNOSIS — I428 Other cardiomyopathies: Secondary | ICD-10-CM | POA: Diagnosis not present

## 2022-08-20 DIAGNOSIS — I5032 Chronic diastolic (congestive) heart failure: Secondary | ICD-10-CM | POA: Diagnosis not present

## 2022-08-20 DIAGNOSIS — I4892 Unspecified atrial flutter: Secondary | ICD-10-CM | POA: Diagnosis not present

## 2022-08-20 LAB — TSH: TSH: 4.051 u[IU]/mL (ref 0.350–4.500)

## 2022-08-20 LAB — T4, FREE: Free T4: 0.91 ng/dL (ref 0.61–1.12)

## 2022-08-20 MED ORDER — APIXABAN 5 MG PO TABS: 5.0000 mg | ORAL_TABLET | Freq: Two times a day (BID) | ORAL | 5 refills | Status: DC

## 2022-08-20 NOTE — Patient Instructions (Addendum)
Labs done today. We will contact you only if your labs are abnormal.  STOP Aspirin  INCREASE Eliquis to 5mg  (1 tablet) by mouth 2 times daily.   No other medication changes were made. Please continue all current medications as prescribed.  Your physician recommends that you schedule a follow-up appointment in: 4 months with an echo prior to your appointment.   If you have any questions or concerns before your next appointment please send [place] a message through [place] or call our office at [phone].    TO LEAVE A MESSAGE FOR THE NURSE SELECT OPTION 2, PLEASE LEAVE A MESSAGE INCLUDING: YOUR NAME DATE OF BIRTH CALL BACK NUMBER REASON FOR CALL**this is important as we prioritize the call backs  YOU WILL RECEIVE A CALL BACK THE SAME DAY AS LONG AS YOU CALL BEFORE 4:00 PM   Do the following things EVERYDAY: Weigh yourself in the morning before breakfast. Write it down and keep it in a log. Take your medicines as prescribed Eat low salt foods--Limit salt (sodium) to 2000 mg per day.  Stay as active as you can everyday Limit all fluids for the day to less than 2 liters   At the [facility], you and your health needs are our priority. As part of our continuing mission to provide you with exceptional heart care, we have created designated Provider Care Teams. These Care Teams include your primary Cardiologist (physician) and Advanced Practice Providers (APPs- Physician Assistants and Nurse Practitioners) who all work together to provide you with the care you need, when you need it.   You may see any of the following providers on your designated Care Team at your next follow up: Dr Arvilla Meres Dr Marca Ancona Dr. Marcos Eke, NP Robbie Lis, [place] [facility] [place], [place] Brynda Peon, NP Karle Plumber, PharmD   Please be sure to bring in all your medications bottles to every appointment.    Thank you for choosing [facility]

## 2022-08-20 NOTE — Telephone Encounter (Signed)
Called to confirm/remind patient of their appointment at the [facility] on 8/8./24.   Patient reminded to bring all medications and/or complete list.  Confirmed patient has transportation. Gave directions, instructed to utilize valet parking.  Confirmed appointment prior to ending call.

## 2022-08-20 NOTE — Progress Notes (Addendum)
PCP: Dr Sudie Bailey HF Cardiologist: Dr Shirlee Latch   HPI: [name] is a 52 year old with a h/o chronic pain, anxiety, depression, substance abuse, A flutter, and HFrEF.    Admitted 02/23 with acute respiratory failure 2/2 strep pneumo and aspiration pneumonia. Found unresponsive and required intubation. Had AFL with RVR. Left AMA.   Admitted 05/16/21 with respiratory failure + A flutter. Found unresponsive. UDS + benzos & meth. Requiring several days of mechanical inhalation.  Echo with newly reduced EF 20-25% and RV severely reduced.  cMRI with LVEF 29%, RVEF 32%, no definite LGE. 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 No significant CAD on cath. Cardioverted back to NSR.  GDMT started for heart failure.   7/23 Entresto/spiro stopped due to hyperkalemia (later added back). BB and dig stopped due to bradycardia.   Echo 12/23: EF 55-60%, RV okay  She is here today for follow-up. Stable from HF standpoint. Taking meds as prescribed except for Eliquis which she takes once a day. No dyspnea, chest pain, orthopnea or PND. No lower extremity edema. Mobility is limited by chronic back pain.  Lives in trailer. She completed 7th grade. She has hard time reading labels.  She does not drive.   Smokes 1 ppd. No ETOH or other drug use.  ECG (personally reviewed): SR 76 bpm  Cardiac Testing  - Echo (12/23): EF 55-60%, RV okay  - Echo (5/23): moderate LV dilation with EF 20-25%, moderately decreased RV systolic function.    - LHC (5/23): no significant CAD   ROS: All systems negative except as listed in HPI, PMH and Problem List.  SH:  Social History   Socioeconomic History   Marital status: Divorced    Spouse name: Not on file   Number of children: Not on file   Years of education: Not on file   Highest education level: Not on file  Occupational History   Not on file  Tobacco Use   Smoking status: Every Day    Current packs/day: 1.50     Average packs/day: 1.5 packs/day for 25.0 years (37.5 ttl pk-yrs)    Types: Cigarettes   Smokeless tobacco: Never  Vaping Use   Vaping status: Never Used  Substance and Sexual Activity   Alcohol use: No   Drug use: No   Sexual activity: Not Currently    Birth control/protection: Surgical    Comment: tubal  Other Topics Concern   Not on file  Social History Narrative   Not on file   Social Determinants of Health   Financial Resource Strain: Low Risk  (06/18/2022)   Overall Financial Resource Strain (CARDIA)    Difficulty of Paying Living Expenses: Not very hard  Food Insecurity: No Food Insecurity (06/18/2022)   Hunger Vital Sign    Worried About Running Out of Food in the Last Year: Never true    Ran Out of Food in the Last Year: Never true  Transportation Needs: No Transportation Needs (06/18/2022)   PRAPARE - Administrator, Civil Service (Medical): No    Lack of Transportation (Non-Medical): No  Physical Activity: Inactive (06/18/2022)   Exercise Vital Sign    Days of Exercise per Week: 0 days    Minutes of Exercise per Session: 0 min  Stress: Stress Concern Present (06/18/2022)   [employer] of Occupational Health - Occupational Stress Questionnaire    Feeling of Stress : To some extent  Social Connections:  Socially Isolated (06/18/2022)   Social Connection and Isolation Panel [NHANES]    Frequency of Communication with Friends and Family: Twice a week    Frequency of Social Gatherings with Friends and Family: Never    Attends Religious Services: Never    Database administrator or Organizations: No    Attends Banker Meetings: Never    Marital Status: Living with partner  Intimate Partner Violence: Not At Risk (06/18/2022)   Humiliation, Afraid, Rape, and Kick questionnaire    Fear of Current or Ex-Partner: No    Emotionally Abused: No    Physically Abused: No    Sexually Abused: No    FH:  Family History  Problem Relation Age of Onset    Other Sister        choked on a biscuit   Other Brother        brittle bone disease   Cancer Brother    Heart disease Other    Arthritis Other    Lung disease Other    Cancer Other    Asthma Other    Diabetes Other    Colon cancer Neg Hx    Past Medical History:  Diagnosis Date   A-fib (HCC)    Anxiety    Back pain    Chronic back pain 02/04/2013   By patient report   COPD (chronic obstructive pulmonary disease) (HCC)    Depression    Factor 5 Leiden mutation, heterozygous (HCC)    GERD (gastroesophageal reflux disease)    Heart failure (HCC)    Hypertension    Laryngitis, chronic    Panic attack    Scoliosis    Ulcer    Current Outpatient Medications  Medication Sig Dispense Refill   albuterol (PROVENTIL HFA;VENTOLIN HFA) 108 (90 BASE) MCG/ACT inhaler Inhale 2 puffs into the lungs every 4 (four) hours as needed for wheezing or shortness of breath. 1 Inhaler 0   ALPRAZolam (XANAX) 0.5 MG tablet Take 0.5 mg by mouth 2 (two) times daily as needed.     amiodarone (PACERONE) 200 MG tablet Take 0.5 tablets (100 mg total) by mouth daily. 30 tablet 5   amitriptyline (ELAVIL) 25 MG tablet Take 25 mg by mouth at bedtime.     aspirin EC 81 MG EC tablet Take 1 tablet (81 mg total) by mouth daily.     atorvastatin (LIPITOR) 20 MG tablet Take 1 tablet (20 mg total) by mouth daily. 90 tablet 3   bismuth subsalicylate (PEPTO BISMOL) 262 MG chewable tablet Chew 524 mg by mouth 3 (three) times daily as needed for indigestion.     budesonide (PULMICORT) 0.25 MG/2ML nebulizer solution Take 2 mLs (0.25 mg total) by nebulization 2 (two) times daily. (Patient taking differently: Take 0.25 mg by nebulization 2 (two) times daily. As needed) 60 mL 12   celecoxib (CELEBREX) 200 MG capsule Take 200 mg by mouth daily.     citalopram (CELEXA) 20 MG tablet Take 20 mg by mouth daily.     dapagliflozin propanediol (FARXIGA) 10 MG TABS tablet TAKE ONE TABLET BY MOUTH ONCE DAILY. 30 tablet 2   ELIQUIS 5 MG  TABS tablet TAKE (1) TABLET BY MOUTH TWICE DAILY. 60 tablet 0   fluticasone (FLONASE) 50 MCG/ACT nasal spray Place 2 sprays into both nostrils as needed for allergies or rhinitis.     furosemide (LASIX) 20 MG tablet Take 2 tablets (40 mg total) by mouth as needed. For weight gain of 3 lbs in  24 hours or 5 lbs in a week 90 tablet 3   ketoconazole (NIZORAL) 2 % shampoo Apply topically.     lubiprostone (AMITIZA) 24 MCG capsule Take 1 capsule (24 mcg total) by mouth 2 (two) times daily with a meal. 60 capsule 5   methocarbamol (ROBAXIN) 750 MG tablet SMARTSIG:1 Tablet(s) By Mouth 1-4 Times Daily     pantoprazole (PROTONIX) 40 MG tablet Take 40 mg by mouth daily.     pregabalin (LYRICA) 200 MG capsule Take 200 mg by mouth 3 (three) times daily.     revefenacin (YUPELRI) 175 MCG/3ML nebulizer solution Take 3 mLs (175 mcg total) by nebulization daily. 90 mL 0   sacubitril-valsartan (ENTRESTO) 24-26 MG TAKE (1) TABLET BY MOUTH TWICE DAILY. PLEASE SCHEDULE FOLLOW UP FOR FURTHER REFILLS 60 tablet 0   spironolactone (ALDACTONE) 25 MG tablet Take 1 tablet (25 mg total) by mouth daily. 90 tablet 3   sucralfate (CARAFATE) 1 g tablet Take 1 tablet (1 g total) by mouth 2 (two) times daily. 30 tablet 0   terbinafine (LAMISIL) 250 MG tablet Take by mouth.     Vitamin D, Ergocalciferol, (DRISDOL) 1.25 MG (50000 UNIT) CAPS capsule Take 50,000 Units by mouth 2 (two) times a week.     vortioxetine HBr (TRINTELLIX) 10 MG TABS tablet Take 10 mg by mouth daily.     VYVANSE 50 MG capsule Take 50 mg by mouth every morning.     zolpidem (AMBIEN) 10 MG tablet Take 10 mg by mouth at bedtime.     No current facility-administered medications for this visit.   LMP 09/14/2017 Comment: PT STATES SHE HAD HER TUBES CUT AND BURNT  Wt Readings from Last 3 Encounters:  06/30/22 102.1 kg (225 lb)  06/18/22 103 kg (227 lb)  12/23/21 98 kg (216 lb)   PHYSICAL EXAM: General:  NAD. No resp difficulty HEENT: Normal Neck: Supple.  No JVD. Carotids 2+ bilat; no bruits. No lymphadenopathy or thryomegaly appreciated. Cor: PMI nondisplaced. Regular rate & rhythm. No rubs, gallops or murmurs. Lungs: Clear Abdomen: Obese, soft, nontender, nondistended. No hepatosplenomegaly. No bruits or masses. Good bowel sounds. Extremities: No cyanosis, clubbing, rash, edema Neuro: Alert & oriented x 3, cranial nerves grossly intact. Moves all 4 extremities w/o difficulty. Affect pleasant.  ASSESSMENT & PLAN: 1. 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  No prior history of CHF.  Echo 05/2021 showed LV EF < 20% with moderate LV dilation, severely decreased RV systolic function, moderate TR, normal IVC.  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 Also consider stress cardiomyopathy from sepsis/PNA.  No significant CAD on cath 5/23. No family history of CM. Echo 12/23: EF improved to 55-60%.  - NYHA II. Not volume overloaded on exam. Has lasix 40 mg to use PRN.  - Not on beta blocker d/t history of bradycardia. HR okay today but did not add beta blocker since LV function has recovered - Continue Farxiga 10 mg daily. - Continue Entresto 24/26 mg bid.  - Continue spironolactone 25 mg daily. - Recent BMET 08/06 stable. - Recheck echo in 12/24 2.Atrial flutter: Initially documented 02/23. DC-CV 05/2021 with restoration of SR. SR on ECG today - Continue amiodarone to 100 mg daily. LFTs 08/06 okay. Check TSH and Free T4 today. She will need a regular eye exam. Potentially consider stopping amiodarone next visit if remains in SR. - Take Eliquis twice a day. Stop aspirin (no  history of MI, CAD, or stroke). - Hgb elevated. Likely d/t smoking. 3. Substance Abuse: UDS + 5/23 for methamphetamines. - She denies further use. 4. Tobacco Abuse: Smokes 1ppd. - Discussed smoking cessation. She is not ready to quit.  Follow up: APP in 4 months with echo  Everest Hacking N PA-C 8:36  AM

## 2022-08-27 ENCOUNTER — Telehealth (INDEPENDENT_AMBULATORY_CARE_PROVIDER_SITE_OTHER): Payer: Medicaid Other | Admitting: Obstetrics & Gynecology

## 2022-08-27 ENCOUNTER — Encounter: Payer: Self-pay | Admitting: Obstetrics & Gynecology

## 2022-08-27 DIAGNOSIS — N950 Postmenopausal bleeding: Secondary | ICD-10-CM | POA: Diagnosis not present

## 2022-08-27 MED ORDER — MEDROXYPROGESTERONE ACETATE 10 MG PO TABS: 10.0000 mg | ORAL_TABLET | Freq: Every day | ORAL | 11 refills | Status: DC

## 2022-08-27 NOTE — Progress Notes (Signed)
MyChart video visit Pt is at home I am in my office 10 minutes total time  Follow up appointment for results: PMB>EMBx  Chief Complaint  Patient presents with   Follow-up    Bleeding started back 3 days ago    Last menstrual period 09/14/2017.  EMBx patholgy was benign but was proliferative So she is stimulating the endometrium with either ovarian or peripheral estrogen Since she is on eliquis her bleeding is heaviers    MEDS ordered this encounter: Meds ordered this encounter  Medications   medroxyPROGESTERone (PROVERA) 10 MG tablet    Sig: Take 1 tablet (10 mg total) by mouth daily. The first 10 days of each calendar month    Dispense:  10 tablet    Refill:  11    Orders for this encounter: No orders of the defined types were placed in this encounter.   Impression + Management Plan   ICD-10-CM   1. 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  Follow up 6 months to see how her bleeding volume is doing, on chronic eliquis      Follow Up: Return in about 6 months (around 02/27/2023).     All questions were answered.  Past Medical History:  Diagnosis Date   A-fib [facility])    Anxiety    Back pain    Chronic back pain 02/04/2013   By patient report   COPD (chronic obstructive pulmonary disease) (HCC)    Depression    Factor 5 Leiden mutation, heterozygous (HCC)    GERD (gastroesophageal reflux disease)    Heart failure (HCC)    Hypertension    Laryngitis, chronic    Panic attack    Scoliosis    Ulcer     Past Surgical History:  Procedure Laterality Date   BACK SURGERY     CARDIOVERSION N/A 05/30/2021   Procedure: CARDIOVERSION;  Surgeon: Laurey Morale, MD;  Location: [facility] ENDOSCOPY;  Service: Cardiovascular;  Laterality: N/A;   CESAREAN SECTION     CHOLECYSTECTOMY  11/27/2011   Procedure: LAPAROSCOPIC CHOLECYSTECTOMY;  Surgeon: Fabio Bering, MD;  Location: [facility] ORS;   Service: General;  Laterality: N/A;   CRYOTHERAPY  02/17/2021   Procedure: CRYOTHERAPY;  Surgeon: Lorin Glass, MD;  Location: [facility] ENDOSCOPY;  Service: Pulmonary;;   EMBOLECTOMY Left 12/06/2015   Procedure: EMBOLECTOMY LEFT ARM;  Surgeon: Sherren Kerns, MD;  Location: [facility] OR;  Service: Vascular;  Laterality: Left;   LEFT HEART CATH AND CORONARY ANGIOGRAPHY N/A 05/29/2021   Procedure: LEFT HEART CATH AND CORONARY ANGIOGRAPHY;  Surgeon: Laurey Morale, MD;  Location: [facility] INVASIVE CV LAB;  Service: Cardiovascular;  Laterality: N/A;   NECK SURGERY     TEE WITHOUT CARDIOVERSION N/A 05/30/2021   Procedure: TRANSESOPHAGEAL ECHOCARDIOGRAM (TEE);  Surgeon: Laurey Morale, MD;  Location: [facility] ENDOSCOPY;  Service: Cardiovascular;  Laterality: N/A;   TUBAL LIGATION     VIDEO BRONCHOSCOPY Right 02/17/2021   Procedure: VIDEO BRONCHOSCOPY WITHOUT FLUORO;  Surgeon: Lorin Glass, MD;  Location: [facility] ENDOSCOPY;  Service: Pulmonary;  Laterality: Right;  with cryo/ basket    OB History     Gravida  4   Para  3   Term  3   Preterm      AB  1   Living  3      SAB      IAB      Ectopic  Multiple      Live Births              Allergies  Allergen Reactions   Penicillins Anaphylaxis    Has patient had a PCN reaction causing immediate rash, facial/tongue/throat swelling, SOB or lightheadedness with hypotension: No Has patient had a PCN reaction causing severe rash involving mucus membranes or skin necrosis: No Has patient had a PCN reaction that required hospitalization No Has patient had a PCN reaction occurring within the last 10 years: No If all of the above answers are "NO", then may proceed with Cephalosporin use.    Ibuprofen Nausea And Vomiting    GI upset, Shakes   Aspirin Nausea And Vomiting    Gi upset    Social History   Socioeconomic History   Marital status: Divorced    Spouse name: Not on file   Number of children: Not on file   Years of education: Not on  file   Highest education level: Not on file  Occupational History   Not on file  Tobacco Use   Smoking status: Every Day    Current packs/day: 1.50    Average packs/day: 1.5 packs/day for 25.0 years (37.5 ttl pk-yrs)    Types: Cigarettes   Smokeless tobacco: Never  Vaping Use   Vaping status: Never Used  Substance and Sexual Activity   Alcohol use: No   Drug use: No   Sexual activity: Not Currently    Birth control/protection: Surgical    Comment: tubal  Other Topics Concern   Not on file  Social History Narrative   Not on file   Social Determinants of Health   Financial Resource Strain: Low Risk  (06/18/2022)   Overall Financial Resource Strain (CARDIA)    Difficulty of Paying Living Expenses: Not very hard  Food Insecurity: No Food Insecurity (06/18/2022)   Hunger Vital Sign    Worried About Running Out of Food in the Last Year: Never true    Ran Out of Food in the Last Year: Never true  Transportation Needs: No Transportation Needs (06/18/2022)   PRAPARE - Administrator, Civil Service (Medical): No    Lack of Transportation (Non-Medical): No  Physical Activity: Inactive (06/18/2022)   Exercise Vital Sign    Days of Exercise per Week: 0 days    Minutes of Exercise per Session: 0 min  Stress: Stress Concern Present (06/18/2022)   [employer] of Occupational Health - Occupational Stress Questionnaire    Feeling of Stress : To some extent  Social Connections: Socially Isolated (06/18/2022)   Social Connection and Isolation Panel [NHANES]    Frequency of Communication with Friends and Family: Twice a week    Frequency of Social Gatherings with Friends and Family: Never    Attends Religious Services: Never    Diplomatic Services operational officer: No    Attends Engineer, structural: Never    Marital Status: Living with partner    Family History  Problem Relation Age of Onset   Other Sister        choked on a biscuit   Other Brother         brittle bone disease   Cancer Brother    Heart disease Other    Arthritis Other    Lung disease Other    Cancer Other    Asthma Other    Diabetes Other    Colon cancer Neg Hx

## 2022-08-31 ENCOUNTER — Other Ambulatory Visit: Payer: Self-pay

## 2022-08-31 DIAGNOSIS — R7989 Other specified abnormal findings of blood chemistry: Secondary | ICD-10-CM

## 2022-09-01 ENCOUNTER — Encounter: Payer: Self-pay | Admitting: *Deleted

## 2022-09-01 ENCOUNTER — Telehealth: Payer: Self-pay | Admitting: *Deleted

## 2022-09-01 ENCOUNTER — Telehealth: Payer: Self-pay

## 2022-09-01 ENCOUNTER — Other Ambulatory Visit: Payer: Self-pay | Admitting: *Deleted

## 2022-09-01 DIAGNOSIS — R7989 Other specified abnormal findings of blood chemistry: Secondary | ICD-10-CM

## 2022-09-01 NOTE — Telephone Encounter (Signed)
  What type of surgery is being performed? COLONOSCOPY  When is surgery scheduled? TBD  What type of clearance is required (medical or pharmacy to hold medication or both? BOTH   Are there any medications that need to be held prior to surgery and how long? ELIQUIS X 48 HOURS  Name of physician performing surgery?  Dr. [name] [facility] Phone: [phone] Fax: [phone]  Anethesia type (none, local, MAC, general)? MAC

## 2022-09-01 NOTE — Telephone Encounter (Signed)
  Patient Consent for Virtual Visit         [name] has provided verbal consent on 09/01/2022 for a virtual visit (video or telephone).   CONSENT FOR VIRTUAL VISIT FOR:  [name]  By participating in this virtual visit I agree to the following:  I hereby voluntarily request, consent and authorize [facility] and its employed or contracted physicians, physician assistants, nurse practitioners or other licensed health care professionals (the Practitioner), to provide me with telemedicine health care services (the "Services") as deemed necessary by the treating Practitioner. I acknowledge and consent to receive the Services by the Practitioner via telemedicine. I understand that the telemedicine visit will involve communicating with the Practitioner through live audiovisual communication technology and the disclosure of certain medical information by electronic transmission. I acknowledge that I have been given the opportunity to request an in-person assessment or other available alternative prior to the telemedicine visit and am voluntarily participating in the telemedicine visit.  I understand that I have the right to withhold or withdraw my consent to the use of telemedicine in the course of my care at any time, without affecting my right to future care or treatment, and that the Practitioner or I may terminate the telemedicine visit at any time. I understand that I have the right to inspect all information obtained and/or recorded in the course of the telemedicine visit and may receive copies of available information for a reasonable fee.  I understand that some of the potential risks of receiving the Services via telemedicine include:  Delay or interruption in medical evaluation due to technological equipment failure or disruption; Information transmitted may not be sufficient (e.g. poor resolution of images) to allow for appropriate medical decision making by the  Practitioner; and/or  In rare instances, security protocols could fail, causing a breach of personal health information.  Furthermore, I acknowledge that it is my responsibility to provide information about my medical history, conditions and care that is complete and accurate to the best of my ability. I acknowledge that Practitioner's advice, recommendations, and/or decision may be based on factors not within their control, such as incomplete or inaccurate data provided by me or distortions of diagnostic images or specimens that may result from electronic transmissions. I understand that the practice of medicine is not an exact science and that Practitioner makes no warranties or guarantees regarding treatment outcomes. I acknowledge that a copy of this consent can be made available to me via my patient portal [facility] MyChart), or I can request a printed copy by calling the office of [facility].    I understand that my insurance will be billed for this visit.   I have read or had this consent read to me. I understand the contents of this consent, which adequately explains the benefits and risks of the Services being provided via telemedicine.  I have been provided ample opportunity to ask questions regarding this consent and the Services and have had my questions answered to my satisfaction. I give my informed consent for the services to be provided through the use of telemedicine in my medical care

## 2022-09-01 NOTE — Telephone Encounter (Signed)
Pt scheduled for tele visit on 09/10/22. Med rec and consent done

## 2022-09-01 NOTE — Telephone Encounter (Signed)
   Name: LAKETHA FU  DOB: 05-20-1970  MRN: [id number]  Primary Cardiologist: None   Preoperative team, please contact this patient and set up a phone call appointment for further preoperative risk assessment. Please obtain consent and complete medication review. Thank you for your help.  I confirm that guidance regarding antiplatelet and oral anticoagulation therapy has been completed and, if necessary, noted below.  Per office protocol, patient can hold Eliquis for 1 days prior to procedure.   Patient will not need bridging with Lovenox (enoxaparin) around procedure.   Ronney Asters, NP 09/01/2022, 11:13 AM [facility]

## 2022-09-01 NOTE — Telephone Encounter (Signed)
Patient with diagnosis of atrial fibrillation  on Eliquis for anticoagulation.    Procedure: colonoscopy Date of procedure: TBD   CHA2DS2-VASc Score = 3   This indicates a 3.2% annual risk of stroke. The patient's score is based upon: CHF History: 1 HTN History: 1 Diabetes History: 0 Stroke History: 0 Vascular Disease History: 0 Age Score: 0 Gender Score: 1    CrCl 132 Platelet count 153  Per office protocol, patient can hold Eliquis for 1 days prior to procedure.   Patient will not need bridging with Lovenox (enoxaparin) around procedure.  **This guidance is not considered finalized until pre-operative APP has relayed final recommendations.**

## 2022-09-10 ENCOUNTER — Ambulatory Visit: Payer: Medicaid Other | Attending: Cardiology | Admitting: Nurse Practitioner

## 2022-09-10 DIAGNOSIS — Z01810 Encounter for preprocedural cardiovascular examination: Secondary | ICD-10-CM | POA: Diagnosis not present

## 2022-09-10 NOTE — Progress Notes (Signed)
Virtual Visit via Telephone Note   Because of [name]'s co-morbid illnesses, she is at least at moderate risk for complications without adequate follow up.  This format is felt to be most appropriate for this patient at this time.  The patient did not have access to video technology/had technical difficulties with video requiring transitioning to audio format only (telephone).  All issues noted in this document were discussed and addressed.  No physical exam could be performed with this format.  Please refer to the patient's chart for her consent to telehealth for [name].  Evaluation Performed:  Preoperative cardiovascular risk assessment _____________   Date:  09/10/2022   Patient ID:  [name], DOB 1970-11-19, MRN [id number] Patient Location:  Home Provider location:   Office  Primary Care Provider:  [name], MD Primary Cardiologist:  None  Chief Complaint / Patient Profile   52 y.o. y/o female with a h/o HFrEF, atrial flutter, chronic pain, anxiety, depression, substance abuse, and tobacco use who is pending colonoscopy with [name] of [facility] at [name]. and presents today for telephonic preoperative cardiovascular risk assessment.  History of Present Illness    [name] is a 52 y.o. female who presents via audio/video conferencing for a telehealth visit today.  Pt was last seen in cardiology clinic on 08/20/2022 by Danielle Genre, NP. At that time [name] was doing well.  The patient is now pending procedure as outlined above. Since her last visit, she has done well from a cardiac standpoint.   She denies chest pain, palpitations, dyspnea, pnd, orthopnea, n, v, dizziness, syncope, edema, weight gain, or early satiety. All other systems reviewed and are otherwise negative except as noted above.   Past Medical History    Past Medical History:  Diagnosis Date   A-fib [facility])    Anxiety    Back pain     Chronic back pain 02/04/2013   By patient report   COPD (chronic obstructive pulmonary disease) (HCC)    Depression    Factor 5 Leiden mutation, heterozygous (HCC)    GERD (gastroesophageal reflux disease)    Heart failure (HCC)    Hypertension    Laryngitis, chronic    Panic attack    Scoliosis    Ulcer    Past Surgical History:  Procedure Laterality Date   BACK SURGERY     CARDIOVERSION N/A 05/30/2021   Procedure: CARDIOVERSION;  Surgeon: Laurey Morale, MD;  Location: [name] ENDOSCOPY;  Service: Cardiovascular;  Laterality: N/A;   CESAREAN SECTION     CHOLECYSTECTOMY  11/27/2011   Procedure: LAPAROSCOPIC CHOLECYSTECTOMY;  Surgeon: Fabio Bering, MD;  Location: [facility] ORS;  Service: General;  Laterality: N/A;   CRYOTHERAPY  02/17/2021   Procedure: CRYOTHERAPY;  Surgeon: Lorin Glass, MD;  Location: [facility] ENDOSCOPY;  Service: Pulmonary;;   EMBOLECTOMY Left 12/06/2015   Procedure: EMBOLECTOMY LEFT ARM;  Surgeon: Sherren Kerns, MD;  Location: [name] OR;  Service: Vascular;  Laterality: Left;   LEFT HEART CATH AND CORONARY ANGIOGRAPHY N/A 05/29/2021   Procedure: LEFT HEART CATH AND CORONARY ANGIOGRAPHY;  Surgeon: Laurey Morale, MD;  Location: [facility] INVASIVE CV LAB;  Service: Cardiovascular;  Laterality: N/A;   NECK SURGERY     TEE WITHOUT CARDIOVERSION N/A 05/30/2021   Procedure: TRANSESOPHAGEAL ECHOCARDIOGRAM (TEE);  Surgeon: Laurey Morale, MD;  Location: [facility] ENDOSCOPY;  Service: Cardiovascular;  Laterality: N/A;   TUBAL LIGATION     VIDEO  BRONCHOSCOPY Right 02/17/2021   Procedure: VIDEO BRONCHOSCOPY WITHOUT FLUORO;  Surgeon: Lorin Glass, MD;  Location: [name] ENDOSCOPY;  Service: Pulmonary;  Laterality: Right;  with cryo/ basket    Allergies  Allergies  Allergen Reactions   Penicillins Anaphylaxis    Has patient had a PCN reaction causing immediate rash, facial/tongue/throat swelling, SOB or lightheadedness with hypotension: No Has patient had a PCN reaction causing severe rash  involving mucus membranes or skin necrosis: No Has patient had a PCN reaction that required hospitalization No Has patient had a PCN reaction occurring within the last 10 years: No If all of the above answers are "NO", then may proceed with Cephalosporin use.    Ibuprofen Nausea And Vomiting    GI upset, Shakes   Aspirin Nausea And Vomiting    Gi upset    Home Medications    Prior to Admission medications   Medication Sig Start Date End Date Taking? Authorizing Provider  albuterol (PROVENTIL HFA;VENTOLIN HFA) 108 (90 BASE) MCG/ACT inhaler Inhale 2 puffs into the lungs every 4 (four) hours as needed for wheezing or shortness of breath. 05/03/14   Ward, Layla Maw, DO  ALPRAZolam Prudy Feeler) 0.5 MG tablet Take 0.5 mg by mouth 2 (two) times daily as needed. 05/26/22   [provider]  amiodarone (PACERONE) 200 MG tablet Take 0.5 tablets (100 mg total) by mouth daily. 12/23/21   Milford, Anderson Malta, FNP  amitriptyline (ELAVIL) 25 MG tablet Take 25 mg by mouth at bedtime.    [provider]  apixaban (ELIQUIS) 5 MG TABS tablet Take 1 tablet (5 mg total) by mouth 2 (two) times daily. 08/20/22   Andrey Farmer, PA-C  atorvastatin (LIPITOR) 20 MG tablet Take 1 tablet (20 mg total) by mouth daily. 06/16/22   Laurey Morale, MD  bismuth subsalicylate (PEPTO BISMOL) 262 MG chewable tablet Chew 524 mg by mouth 3 (three) times daily as needed for indigestion.    [provider]  budesonide (PULMICORT) 0.25 MG/2ML nebulizer solution Take 2 mLs (0.25 mg total) by nebulization 2 (two) times daily. Patient taking differently: Take 0.25 mg by nebulization 2 (two) times daily. As needed 06/03/21   Azucena Fallen, MD  celecoxib (CELEBREX) 200 MG capsule Take 200 mg by mouth daily. 12/26/19   [provider]  citalopram (CELEXA) 20 MG tablet Take 20 mg by mouth daily. At night    [provider]  dapagliflozin propanediol (FARXIGA) 10 MG TABS tablet TAKE ONE TABLET  BY MOUTH ONCE DAILY. 08/06/22   Laurey Morale, MD  fluticasone [facility]) 50 MCG/ACT nasal spray Place 2 sprays into both nostrils as needed for allergies or rhinitis.    [provider]  furosemide (LASIX) 20 MG tablet Take 2 tablets (40 mg total) by mouth as needed. For weight gain of 3 lbs in 24 hours or 5 lbs in a week 12/23/21 12/18/22  Jacklynn Ganong, FNP  ketoconazole (NIZORAL) 2 % shampoo Apply topically. 06/18/22   [provider]  lubiprostone (AMITIZA) 24 MCG capsule Take 1 capsule (24 mcg total) by mouth 2 (two) times daily with a meal. 06/30/22   Tiffany Kocher, PA-C  medroxyPROGESTERone (PROVERA) 10 MG tablet Take 1 tablet (10 mg total) by mouth daily. The first 10 days of each calendar month 08/27/22   Lazaro Arms, MD  methocarbamol (ROBAXIN) 750 MG tablet SMARTSIG:1 Tablet(s) By Mouth 1-4 Times Daily    [provider]  pantoprazole (PROTONIX) 40 MG tablet  Take 40 mg by mouth daily.    [provider]  pregabalin (LYRICA) 200 MG capsule Take 200 mg by mouth 3 (three) times daily.    [provider]  revefenacin (YUPELRI) 175 MCG/3ML nebulizer solution Take 3 mLs (175 mcg total) by nebulization daily. Patient taking differently: Take 175 mcg by nebulization daily. As needed 06/04/21   Azucena Fallen, MD  sacubitril-valsartan (ENTRESTO) 24-26 MG TAKE (1) TABLET BY MOUTH TWICE DAILY. PLEASE SCHEDULE FOLLOW UP FOR FURTHER REFILLS 07/15/22   Laurey Morale, MD  spironolactone (ALDACTONE) 25 MG tablet Take 1 tablet (25 mg total) by mouth daily. 06/16/22   Laurey Morale, MD  sucralfate (CARAFATE) 1 g tablet Take 1 tablet (1 g total) by mouth 2 (two) times daily. 02/17/20   Aberman, Caroline C, PA-C  TRINTELLIX 20 MG TABS tablet Take 20 mg by mouth daily. 07/20/22   [provider]  Vitamin D, Ergocalciferol, (DRISDOL) 1.25 MG (50000 UNIT) CAPS capsule Take 50,000 Units by mouth 2 (two) times a week. 02/16/20   [provider]   vortioxetine HBr (TRINTELLIX) 10 MG TABS tablet Take 10 mg by mouth daily.    [provider]  VYVANSE 50 MG capsule Take 50 mg by mouth every morning. 05/26/22   [provider]  zolpidem (AMBIEN) 10 MG tablet Take 10 mg by mouth at bedtime. 05/26/22   [provider]    Physical Exam    Vital Signs:  [name] does not have vital signs available for review today.  Given telephonic nature of communication, physical exam is limited. AAOx3. NAD. Normal affect.  Speech and respirations are unlabored.  Accessory Clinical Findings    None  Assessment & Plan    1.  Preoperative Cardiovascular Risk Assessment:  According to the Revised Cardiac Risk Index (RCRI), her Perioperative Risk of Major Cardiac Event is (%): 0.9. Her Functional Capacity in METs is: 5.07 according to the Duke Activity Status Index (DASI).Therefore, based on ACC/AHA guidelines, patient would be at acceptable risk for the planned procedure without further cardiovascular testing.   Per office protocol, patient can hold Eliquis for 1 days prior to procedure.   Patient will not need bridging with Lovenox (enoxaparin) around procedure. Please resume Eliquis as soon as possible postprocedure, at the discretion of the surgeon.     The patient was advised that if she develops new symptoms prior to surgery to contact our office to arrange for a follow-up visit, and she verbalized understanding.  A copy of this note will be routed to requesting surgeon.  Time:   Today, I have spent 5 minutes with the patient with telehealth technology discussing medical history, symptoms, and management plan.     Joylene Grapes, NP  09/10/2022, 2:32 PM

## 2022-09-10 NOTE — Telephone Encounter (Signed)
See notes from visit today

## 2022-09-11 ENCOUNTER — Ambulatory Visit (HOSPITAL_COMMUNITY): Payer: Medicaid Other | Attending: Gastroenterology

## 2022-09-14 ENCOUNTER — Other Ambulatory Visit (HOSPITAL_COMMUNITY): Payer: Self-pay | Admitting: Cardiology

## 2022-09-15 ENCOUNTER — Other Ambulatory Visit (HOSPITAL_COMMUNITY): Payer: Self-pay | Admitting: Cardiology

## 2022-09-16 NOTE — Telephone Encounter (Signed)
Ok to schedule colonoscopy.  She should not take Eliquis the day before or morning of her colonoscopy. She will essentially be off Eliquis 36 hours per cardiology protocol.  She should not take pepto for one week before.   Hold farxiga 72 hours before.  Continue amitiza bid.

## 2022-09-17 MED ORDER — ENTRESTO 24-26 MG PO TABS: 1.0000 | ORAL_TABLET | Freq: Two times a day (BID) | ORAL | 2 refills | Status: DC

## 2022-09-17 NOTE — Telephone Encounter (Signed)
Will call to schedule once we have future schedule

## 2022-09-19 ENCOUNTER — Other Ambulatory Visit (HOSPITAL_COMMUNITY): Payer: Self-pay | Admitting: Physician Assistant

## 2022-09-29 ENCOUNTER — Encounter: Payer: Self-pay | Admitting: *Deleted

## 2022-09-29 ENCOUNTER — Other Ambulatory Visit: Payer: Self-pay | Admitting: *Deleted

## 2022-09-29 DIAGNOSIS — R7989 Other specified abnormal findings of blood chemistry: Secondary | ICD-10-CM

## 2022-09-29 MED ORDER — PEG 3350-KCL-NA BICARB-NACL 420 G PO SOLR: 4000.0000 mL | Freq: Once | ORAL | 0 refills | Status: AC

## 2022-09-29 NOTE — Telephone Encounter (Signed)
Pt has been scheduled for 11/02/22, instructions mailed and sent via MyChart. Prep sent to the pharmacy

## 2022-10-06 ENCOUNTER — Ambulatory Visit (HOSPITAL_COMMUNITY)
Admission: RE | Admit: 2022-10-06 | Discharge: 2022-10-06 | Disposition: A | Discharge status: Home / Self Care | Payer: Medicaid Other | Source: Ambulatory Visit | Attending: Gastroenterology | Admitting: Gastroenterology

## 2022-10-06 DIAGNOSIS — R932 Abnormal findings on diagnostic imaging of liver and biliary tract: Secondary | ICD-10-CM | POA: Diagnosis not present

## 2022-10-06 DIAGNOSIS — R7989 Other specified abnormal findings of blood chemistry: Secondary | ICD-10-CM | POA: Insufficient documentation

## 2022-10-06 DIAGNOSIS — Z9049 Acquired absence of other specified parts of digestive tract: Secondary | ICD-10-CM | POA: Diagnosis not present

## 2022-10-06 DIAGNOSIS — R160 Hepatomegaly, not elsewhere classified: Secondary | ICD-10-CM | POA: Diagnosis not present

## 2022-10-20 DIAGNOSIS — Z6839 Body mass index (BMI) 39.0-39.9, adult: Secondary | ICD-10-CM | POA: Diagnosis not present

## 2022-10-20 DIAGNOSIS — Z79899 Other long term (current) drug therapy: Secondary | ICD-10-CM | POA: Diagnosis not present

## 2022-10-20 DIAGNOSIS — G4700 Insomnia, unspecified: Secondary | ICD-10-CM | POA: Diagnosis not present

## 2022-10-20 DIAGNOSIS — F909 Attention-deficit hyperactivity disorder, unspecified type: Secondary | ICD-10-CM | POA: Diagnosis not present

## 2022-10-20 DIAGNOSIS — F17210 Nicotine dependence, cigarettes, uncomplicated: Secondary | ICD-10-CM | POA: Diagnosis not present

## 2022-10-20 DIAGNOSIS — F411 Generalized anxiety disorder: Secondary | ICD-10-CM | POA: Diagnosis not present

## 2022-10-20 DIAGNOSIS — F341 Dysthymic disorder: Secondary | ICD-10-CM | POA: Diagnosis not present

## 2022-10-28 ENCOUNTER — Encounter (HOSPITAL_COMMUNITY): Payer: Self-pay

## 2022-10-28 ENCOUNTER — Encounter (HOSPITAL_COMMUNITY): Payer: Medicaid Other

## 2022-10-28 ENCOUNTER — Encounter (HOSPITAL_COMMUNITY)
Admission: RE | Admit: 2022-10-28 | Discharge: 2022-10-28 | Disposition: A | Discharge status: Home / Self Care | Payer: Medicaid Other | Source: Ambulatory Visit | Attending: Internal Medicine | Admitting: Internal Medicine

## 2022-10-28 HISTORY — DX: Fibromyalgia: M79.7

## 2022-10-28 HISTORY — DX: Pneumonia, unspecified organism: J18.9

## 2022-10-28 HISTORY — DX: Attention-deficit hyperactivity disorder, unspecified type: F90.9

## 2022-10-28 NOTE — Pre-Procedure Instructions (Signed)
Attempted pre-op phone call. Left VM for her to call us back. 

## 2022-11-01 ENCOUNTER — Encounter (HOSPITAL_COMMUNITY): Payer: Self-pay

## 2022-11-01 NOTE — Plan of Care (Signed)
[facility] Tonsillectomy/Adenoidectomy, Postoperative PEDS care plan entered in error.

## 2022-11-02 ENCOUNTER — Ambulatory Visit (HOSPITAL_COMMUNITY): Payer: Medicaid Other | Admitting: Anesthesiology

## 2022-11-02 ENCOUNTER — Encounter (HOSPITAL_COMMUNITY): Payer: Self-pay

## 2022-11-02 ENCOUNTER — Ambulatory Visit (HOSPITAL_COMMUNITY)
Admission: RE | Admit: 2022-11-02 | Discharge: 2022-11-02 | Disposition: A | Discharge status: Home / Self Care | Payer: Medicaid Other | Attending: Internal Medicine | Admitting: Internal Medicine

## 2022-11-02 ENCOUNTER — Encounter (HOSPITAL_COMMUNITY)
Admission: RE | Disposition: A | Discharge status: Home / Self Care | Payer: Self-pay | Source: Home / Self Care | Attending: Internal Medicine

## 2022-11-02 DIAGNOSIS — Z6838 Body mass index (BMI) 38.0-38.9, adult: Secondary | ICD-10-CM | POA: Diagnosis not present

## 2022-11-02 DIAGNOSIS — K6289 Other specified diseases of anus and rectum: Secondary | ICD-10-CM | POA: Diagnosis not present

## 2022-11-02 DIAGNOSIS — D122 Benign neoplasm of ascending colon: Secondary | ICD-10-CM | POA: Insufficient documentation

## 2022-11-02 DIAGNOSIS — D6851 Activated protein C resistance: Secondary | ICD-10-CM | POA: Insufficient documentation

## 2022-11-02 DIAGNOSIS — D126 Benign neoplasm of colon, unspecified: Secondary | ICD-10-CM

## 2022-11-02 DIAGNOSIS — J449 Chronic obstructive pulmonary disease, unspecified: Secondary | ICD-10-CM | POA: Insufficient documentation

## 2022-11-02 DIAGNOSIS — G8929 Other chronic pain: Secondary | ICD-10-CM | POA: Insufficient documentation

## 2022-11-02 DIAGNOSIS — K6389 Other specified diseases of intestine: Secondary | ICD-10-CM

## 2022-11-02 DIAGNOSIS — R195 Other fecal abnormalities: Secondary | ICD-10-CM | POA: Insufficient documentation

## 2022-11-02 DIAGNOSIS — K635 Polyp of colon: Secondary | ICD-10-CM

## 2022-11-02 DIAGNOSIS — I110 Hypertensive heart disease with heart failure: Secondary | ICD-10-CM | POA: Insufficient documentation

## 2022-11-02 DIAGNOSIS — K219 Gastro-esophageal reflux disease without esophagitis: Secondary | ICD-10-CM | POA: Diagnosis not present

## 2022-11-02 DIAGNOSIS — F17210 Nicotine dependence, cigarettes, uncomplicated: Secondary | ICD-10-CM | POA: Diagnosis not present

## 2022-11-02 DIAGNOSIS — K529 Noninfective gastroenteritis and colitis, unspecified: Secondary | ICD-10-CM | POA: Diagnosis not present

## 2022-11-02 DIAGNOSIS — Z7901 Long term (current) use of anticoagulants: Secondary | ICD-10-CM | POA: Diagnosis not present

## 2022-11-02 DIAGNOSIS — Z1211 Encounter for screening for malignant neoplasm of colon: Secondary | ICD-10-CM | POA: Insufficient documentation

## 2022-11-02 DIAGNOSIS — I509 Heart failure, unspecified: Secondary | ICD-10-CM | POA: Insufficient documentation

## 2022-11-02 DIAGNOSIS — Z7951 Long term (current) use of inhaled steroids: Secondary | ICD-10-CM | POA: Diagnosis not present

## 2022-11-02 DIAGNOSIS — M797 Fibromyalgia: Secondary | ICD-10-CM | POA: Diagnosis not present

## 2022-11-02 DIAGNOSIS — D124 Benign neoplasm of descending colon: Secondary | ICD-10-CM | POA: Diagnosis not present

## 2022-11-02 DIAGNOSIS — I4891 Unspecified atrial fibrillation: Secondary | ICD-10-CM | POA: Diagnosis not present

## 2022-11-02 HISTORY — PX: COLONOSCOPY WITH PROPOFOL: SHX5780

## 2022-11-02 HISTORY — PX: BIOPSY: SHX5522

## 2022-11-02 HISTORY — PX: POLYPECTOMY: SHX5525

## 2022-11-02 SURGERY — COLONOSCOPY WITH PROPOFOL
Anesthesia: General

## 2022-11-02 MED ORDER — STERILE WATER FOR IRRIGATION IR SOLN
Status: DC | PRN
  Administered 2022-11-02: 100 mL

## 2022-11-02 MED ORDER — PROPOFOL 10 MG/ML IV BOLUS
INTRAVENOUS | Status: DC | PRN
Start: 2022-11-02 — End: 2022-11-02
  Administered 2022-11-02: 20 mg via INTRAVENOUS
  Administered 2022-11-02: 10 mg via INTRAVENOUS
  Administered 2022-11-02 (×3): 50 mg via INTRAVENOUS
  Administered 2022-11-02: 20 mg via INTRAVENOUS

## 2022-11-02 MED ORDER — LACTATED RINGERS IV SOLN: INTRAVENOUS | Status: DC | PRN

## 2022-11-02 MED ORDER — PROPOFOL 500 MG/50ML IV EMUL
INTRAVENOUS | Status: DC | PRN
  Administered 2022-11-02: 150 ug/kg/min via INTRAVENOUS

## 2022-11-02 MED ORDER — LIDOCAINE HCL 1 % IJ SOLN
INTRAMUSCULAR | Status: DC | PRN
  Administered 2022-11-02: 50 mg via INTRADERMAL

## 2022-11-02 NOTE — Discharge Instructions (Addendum)
  Colonoscopy Discharge Instructions  Read the instructions outlined below and refer to this sheet in the next few weeks. These discharge instructions provide you with general information on caring for yourself after you leave the hospital. Your doctor may also give you specific instructions. While your treatment has been planned according to the most current medical practices available, unavoidable complications occasionally occur.   ACTIVITY You may resume your regular activity, but move at a slower pace for the next 24 hours.  Take frequent rest periods for the next 24 hours.  Walking will help get rid of the air and reduce the bloated feeling in your belly (abdomen).  No driving for 24 hours (because of the medicine (anesthesia) used during the test).   Do not sign any important legal documents or operate any machinery for 24 hours (because of the anesthesia used during the test).  NUTRITION Drink plenty of fluids.  You may resume your normal diet as instructed by your doctor.  Begin with a light meal and progress to your normal diet. Heavy or fried foods are harder to digest and may make you feel sick to your stomach (nauseated).  Avoid alcoholic beverages for 24 hours or as instructed.  MEDICATIONS You may resume your normal medications unless your doctor tells you otherwise.  WHAT YOU CAN EXPECT TODAY Some feelings of bloating in the abdomen.  Passage of more gas than usual.  Spotting of blood in your stool or on the toilet paper.  IF YOU HAD POLYPS REMOVED DURING THE COLONOSCOPY: No aspirin products for 7 days or as instructed.  No alcohol for 7 days or as instructed.  Eat a soft diet for the next 24 hours.  FINDING OUT THE RESULTS OF YOUR TEST Not all test results are available during your visit. If your test results are not back during the visit, make an appointment with your caregiver to find out the results. Do not assume everything is normal if you have not heard from your  caregiver or the medical facility. It is important for you to follow up on all of your test results.  SEEK IMMEDIATE MEDICAL ATTENTION IF: You have more than a spotting of blood in your stool.  Your belly is swollen (abdominal distention).  You are nauseated or vomiting.  You have a temperature over 101.  You have abdominal pain or discomfort that is severe or gets worse throughout the day.   Your colonoscopy revealed 2 polyp(s) which I removed successfully. Await pathology results, my office will contact you.   You also had evidence of inflammation in your rectum.  I took numerous biopsies of this region.  We will call with these results as well.  Repeat colonoscopy timing to be determined.  Follow-up in GI office in 6 weeks.  Resume Eliquis tomorrow.  I hope you have a great rest of your week!  Hennie Duos. Marletta Lor, D.O. Gastroenterology and Hepatology [facility]

## 2022-11-02 NOTE — H&P (Signed)
Primary Care Physician:  [facility], [facility] Primary Gastroenterologist:  Dr. Marletta Lor  Pre-Procedure History & Physical: HPI:  [name] is a 52 y.o. female is here for a colonoscopy for colon cancer screening purposes/positive Cologuard testing  Past Medical History:  Diagnosis Date   A-fib [facility])    ADHD (attention deficit hyperactivity disorder)    Anxiety    Back pain    Chronic back pain 02/04/2013   By patient report   COPD (chronic obstructive pulmonary disease) (HCC)    Depression    Factor 5 Leiden mutation, heterozygous (HCC)    Fibromyalgia    GERD (gastroesophageal reflux disease)    Heart failure (HCC)    Hypertension    Laryngitis, chronic    Panic attack    Pneumonia    Scoliosis    Sepsis (HCC) 05/2021   Ulcer     Past Surgical History:  Procedure Laterality Date   BACK SURGERY     CARDIOVERSION N/A 05/30/2021   Procedure: CARDIOVERSION;  Surgeon: Laurey Morale, MD;  Location: [facility] ENDOSCOPY;  Service: Cardiovascular;  Laterality: N/A;   CESAREAN SECTION     CHOLECYSTECTOMY  11/27/2011   Procedure: LAPAROSCOPIC CHOLECYSTECTOMY;  Surgeon: Fabio Bering, MD;  Location: [facility] ORS;  Service: General;  Laterality: N/A;   CRYOTHERAPY  02/17/2021   Procedure: CRYOTHERAPY;  Surgeon: Lorin Glass, MD;  Location: [facility] ENDOSCOPY;  Service: Pulmonary;;   EMBOLECTOMY Left 12/06/2015   Procedure: EMBOLECTOMY LEFT ARM;  Surgeon: Sherren Kerns, MD;  Location: [facility] OR;  Service: Vascular;  Laterality: Left;   LEFT HEART CATH AND CORONARY ANGIOGRAPHY N/A 05/29/2021   Procedure: LEFT HEART CATH AND CORONARY ANGIOGRAPHY;  Surgeon: Laurey Morale, MD;  Location: [facility] INVASIVE CV LAB;  Service: Cardiovascular;  Laterality: N/A;   NECK SURGERY     TEE WITHOUT CARDIOVERSION N/A 05/30/2021   Procedure: TRANSESOPHAGEAL ECHOCARDIOGRAM (TEE);  Surgeon: Laurey Morale, MD;  Location: [facility] ENDOSCOPY;  Service: Cardiovascular;  Laterality: N/A;   TUBAL LIGATION     VIDEO  BRONCHOSCOPY Right 02/17/2021   Procedure: VIDEO BRONCHOSCOPY WITHOUT FLUORO;  Surgeon: Lorin Glass, MD;  Location: [facility] ENDOSCOPY;  Service: Pulmonary;  Laterality: Right;  with cryo/ basket    Prior to Admission medications   Medication Sig Start Date End Date Taking? Authorizing Provider  albuterol (PROVENTIL HFA;VENTOLIN HFA) 108 (90 BASE) MCG/ACT inhaler Inhale 2 puffs into the lungs every 4 (four) hours as needed for wheezing or shortness of breath. 05/03/14  Yes Ward, Layla Maw, DO  ALPRAZolam (XANAX) 0.5 MG tablet Take 0.5 mg by mouth 2 (two) times daily as needed. 05/26/22  Yes [provider]  amiodarone (PACERONE) 200 MG tablet Take 0.5 tablets (100 mg total) by mouth daily. 12/23/21  Yes Milford, Anderson Malta, FNP  amitriptyline (ELAVIL) 25 MG tablet Take 25 mg by mouth at bedtime.   Yes [provider]  atorvastatin (LIPITOR) 20 MG tablet Take 1 tablet (20 mg total) by mouth daily. 06/16/22  Yes Laurey Morale, MD  celecoxib (CELEBREX) 200 MG capsule Take 200 mg by mouth daily. 12/26/19  Yes [provider]  citalopram (CELEXA) 20 MG tablet Take 20 mg by mouth daily. At night   Yes [provider]  fluticasone (FLONASE) 50 MCG/ACT nasal spray Place 2 sprays into both nostrils as needed for allergies or rhinitis.   Yes [provider]  lubiprostone (AMITIZA) 24 MCG capsule Take 1 capsule (24 mcg total) by mouth 2 (  two) times daily with a meal. 06/30/22  Yes Tiffany Kocher, PA-C  methocarbamol (ROBAXIN) 750 MG tablet SMARTSIG:1 Tablet(s) By Mouth 1-4 Times Daily   Yes [provider]  pantoprazole (PROTONIX) 40 MG tablet Take 40 mg by mouth daily.   Yes [provider]  pregabalin (LYRICA) 200 MG capsule Take 200 mg by mouth 3 (three) times daily.   Yes [provider]  sacubitril-valsartan (ENTRESTO) 24-26 MG Take 1 tablet by mouth 2 (two) times daily. TAKE (1) TABLET BY MOUTH TWICE DAILY. PLEASE SCHEDULE FOLLOW UP FOR  FURTHER REFILLS 09/17/22  Yes Laurey Morale, MD  spironolactone (ALDACTONE) 25 MG tablet Take 1 tablet (25 mg total) by mouth daily. 06/16/22  Yes Laurey Morale, MD  sucralfate (CARAFATE) 1 g tablet Take 1 tablet (1 g total) by mouth 2 (two) times daily. 02/17/20  Yes Aberman, Caroline C, PA-C  TRINTELLIX 20 MG TABS tablet Take 20 mg by mouth daily. 07/20/22  Yes [provider]  Vitamin D, Ergocalciferol, (DRISDOL) 1.25 MG (50000 UNIT) CAPS capsule Take 50,000 Units by mouth 2 (two) times a week. 02/16/20  Yes [provider]  vortioxetine HBr (TRINTELLIX) 10 MG TABS tablet Take 10 mg by mouth daily.   Yes [provider]  VYVANSE 50 MG capsule Take 50 mg by mouth every morning. 05/26/22  Yes [provider]  zolpidem (AMBIEN) 10 MG tablet Take 10 mg by mouth at bedtime. 05/26/22  Yes [provider]  apixaban (ELIQUIS) 5 MG TABS tablet Take 1 tablet (5 mg total) by mouth 2 (two) times daily. 08/20/22   Andrey Farmer, PA-C  bismuth subsalicylate (PEPTO BISMOL) 262 MG chewable tablet Chew 524 mg by mouth 3 (three) times daily as needed for indigestion.    [provider]  budesonide (PULMICORT) 0.25 MG/2ML nebulizer solution Take 2 mLs (0.25 mg total) by nebulization 2 (two) times daily. Patient taking differently: Take 0.25 mg by nebulization 2 (two) times daily. As needed 06/03/21   Azucena Fallen, MD  dapagliflozin propanediol (FARXIGA) 10 MG TABS tablet TAKE ONE TABLET BY MOUTH ONCE DAILY. 08/06/22   Laurey Morale, MD  furosemide (LASIX) 20 MG tablet Take 2 tablets (40 mg total) by mouth as needed. For weight gain of 3 lbs in 24 hours or 5 lbs in a week 12/23/21 12/18/22  Jacklynn Ganong, FNP  ketoconazole (NIZORAL) 2 % shampoo Apply topically. 06/18/22   [provider]  medroxyPROGESTERone (PROVERA) 10 MG tablet Take 1 tablet (10 mg total) by mouth daily. The first 10 days of each calendar month 08/27/22   Lazaro Arms, MD   revefenacin [facility]) 175 MCG/3ML nebulizer solution Take 3 mLs (175 mcg total) by nebulization daily. Patient taking differently: Take 175 mcg by nebulization daily. As needed 06/04/21   Azucena Fallen, MD    Allergies as of 09/29/2022 - Review Complete 09/10/2022  Allergen Reaction Noted   Penicillins Anaphylaxis 07/24/2010   Ibuprofen Nausea And Vomiting 10/23/2010   Aspirin Nausea And Vomiting 10/23/2010    Family History  Problem Relation Age of Onset   Other Sister        choked on a biscuit   Other Brother        brittle bone disease   Cancer Brother    Heart disease Other    Arthritis Other    Lung disease Other    Cancer Other    Asthma Other    Diabetes Other  Colon cancer Neg Hx     Social History   Socioeconomic History   Marital status: Divorced    Spouse name: Not on file   Number of children: Not on file   Years of education: Not on file   Highest education level: Not on file  Occupational History   Not on file  Tobacco Use   Smoking status: Every Day    Current packs/day: 1.50    Average packs/day: 1.5 packs/day for 25.0 years (37.5 ttl pk-yrs)    Types: Cigarettes   Smokeless tobacco: Never  Vaping Use   Vaping status: Never Used  Substance and Sexual Activity   Alcohol use: No   Drug use: No   Sexual activity: Not Currently    Birth control/protection: Surgical    Comment: tubal  Other Topics Concern   Not on file  Social History Narrative   Not on file   Social Determinants of Health   Financial Resource Strain: Low Risk  (06/18/2022)   Overall Financial Resource Strain (CARDIA)    Difficulty of Paying Living Expenses: Not very hard  Food Insecurity: No Food Insecurity (06/18/2022)   Hunger Vital Sign    Worried About Running Out of Food in the Last Year: Never true    Ran Out of Food in the Last Year: Never true  Transportation Needs: No Transportation Needs (06/18/2022)   PRAPARE - Administrator, Civil Service  (Medical): No    Lack of Transportation (Non-Medical): No  Physical Activity: Inactive (06/18/2022)   Exercise Vital Sign    Days of Exercise per Week: 0 days    Minutes of Exercise per Session: 0 min  Stress: Stress Concern Present (06/18/2022)   [employer] of Occupational Health - Occupational Stress Questionnaire    Feeling of Stress : To some extent  Social Connections: Socially Isolated (06/18/2022)   Social Connection and Isolation Panel [NHANES]    Frequency of Communication with Friends and Family: Twice a week    Frequency of Social Gatherings with Friends and Family: Never    Attends Religious Services: Never    Database administrator or Organizations: No    Attends Banker Meetings: Never    Marital Status: Living with partner  Intimate Partner Violence: Not At Risk (06/18/2022)   Humiliation, Afraid, Rape, and Kick questionnaire    Fear of Current or Ex-Partner: No    Emotionally Abused: No    Physically Abused: No    Sexually Abused: No    Review of Systems: See HPI, otherwise negative ROS  Physical Exam: Vital signs in last 24 hours: Temp:  [98.5 F (36.9 C)] 98.5 F (36.9 C) (10/21 1029) Pulse Rate:  [72] 72 (10/21 1029) Resp:  [15] 15 (10/21 1029) BP: (129)/(89) 129/89 (10/21 1029) SpO2:  [91 %] 91 % (10/21 1029) Weight:  [96.6 kg] 96.6 kg (10/21 1029)   General:   Alert,  Well-developed, well-nourished, pleasant and cooperative in NAD Head:  Normocephalic and atraumatic. Eyes:  Sclera clear, no icterus.   Conjunctiva pink. Ears:  Normal auditory acuity. Nose:  No deformity, discharge,  or lesions. Msk:  Symmetrical without gross deformities. Normal posture. Extremities:  Without clubbing or edema. Neurologic:  Alert and  oriented x4;  grossly normal neurologically. Skin:  Intact without significant lesions or rashes. Psych:  Alert and cooperative. Normal mood and affect.  Impression/Plan: [name] is here for a colonoscopy to be  performed for colon cancer screening purposes/positive  Cologuard testing  The risks of the procedure including infection, bleed, or perforation as well as benefits, limitations, alternatives and imponderables have been reviewed with the patient. Questions have been answered. All parties agreeable.

## 2022-11-02 NOTE — Op Note (Signed)
[facility] Patient Name: [name] Procedure Date: 11/02/2022 11:38 AM MRN: [id number] Date of Birth: 09-28-1970 Attending MD: Hennie Duos. Marletta Lor , [place], [id number] CSN: [id number] Age: 52 Admit Type: Outpatient Procedure:                Colonoscopy Indications:              Screening for colorectal malignant neoplasm,                            Incidental - Positive Cologuard test Providers:                Hennie Duos. Marletta Lor, DO, Angelica Ran, Dyann Ruddle Referring MD:              Medicines:                See the Anesthesia note for documentation of the                            administered medications Complications:            No immediate complications. Estimated Blood Loss:     Estimated blood loss was minimal. Procedure:                Pre-Anesthesia Assessment:                           - The anesthesia plan was to use monitored                            anesthesia care (MAC).                           After obtaining informed consent, the colonoscope                            was passed under direct vision. Throughout the                            procedure, the patient's blood pressure, pulse, and                            oxygen saturations were monitored continuously. The                            PCF-HQ190L ([id number]) scope was introduced through                            the anus and advanced to the the cecum, identified                            by appendiceal orifice and ileocecal valve. The                            colonoscopy was performed without difficulty. The                            patient tolerated  the procedure well. The quality                            of the bowel preparation was evaluated using the                            BBPS [facility] Bowel Preparation Scale) with scores                            of: Right Colon = 2 (minor amount of residual                            staining, small fragments of stool and/or opaque                             liquid, but mucosa seen well), Transverse Colon = 2                            (minor amount of residual staining, small fragments                            of stool and/or opaque liquid, but mucosa seen                            well) and Left Colon = 2 (minor amount of residual                            staining, small fragments of stool and/or opaque                            liquid, but mucosa seen well). The total BBPS score                            equals 6. Fair. Scope In: 11:57:29 AM Scope Out: 12:17:13 PM Scope Withdrawal Time: 0 hours 17 minutes 24 seconds  Total Procedure Duration: 0 hours 19 minutes 44 seconds  Findings:      A 10 mm polyp was found in the ascending colon. The polyp was sessile.       The polyp was removed with a cold snare. Resection and retrieval were       complete.      A 5 mm polyp was found in the descending colon. The polyp was sessile.       The polyp was removed with a cold snare. Resection and retrieval were       complete.      Localized inflammation characterized by erosions, erythema and shallow       ulcerations was found in the rectum. Biopsies were taken with a cold       forceps for histology. Impression:               - One 10 mm polyp in the ascending colon, removed                            with a cold snare.  Resected and retrieved.                           - One 5 mm polyp in the descending colon, removed                            with a cold snare. Resected and retrieved.                           - Localized mild inflammation was found in the                            rectum secondary to proctitis. Biopsied. Moderate Sedation:      Per Anesthesia Care Recommendation:           - Patient has a contact number available for                            emergencies. The signs and symptoms of potential                            delayed complications were discussed with the                            patient. Return to  normal activities tomorrow.                            Written discharge instructions were provided to the                            patient.                           - Resume previous diet.                           - Continue present medications.                           - Await pathology results.                           - Repeat colonoscopy date to be determined after                            pending pathology results are reviewed for                            surveillance based on pathology results.                           - Return to GI clinic in 6 weeks. Procedure Code(s):        --- Professional ---                           [phone], Colonoscopy, flexible; with removal of  tumor(s), polyp(s), or other lesion(s) by snare                            technique                           45380, 59, Colonoscopy, flexible; with biopsy,                            single or multiple Diagnosis Code(s):        --- Professional ---                           Z12.11, Encounter for screening for malignant                            neoplasm of colon                           D12.2, Benign neoplasm of ascending colon                           D12.4, Benign neoplasm of descending colon                           K62.89, Other specified diseases of anus and rectum CPT copyright 2022 American Medical Association. All rights reserved. The codes documented in this report are preliminary and upon coder review may  be revised to meet current compliance requirements. Hennie Duos. Marletta Lor, DO Hennie Duos. Marletta Lor, DO 11/02/2022 12:20:00 PM This report has been signed electronically. Number of Addenda: 0

## 2022-11-02 NOTE — Anesthesia Preprocedure Evaluation (Addendum)
Anesthesia Evaluation  Patient identified by MRN, date of birth, ID band Patient awake    Reviewed: Allergy & Precautions, NPO status , Patient's Chart, lab work & pertinent test results  Airway Mallampati: II  TM Distance: >3 FB     Dental  (+) Edentulous Upper, Edentulous Lower   Pulmonary shortness of breath, pneumonia, resolved, COPD, Current Smoker and Patient abstained from smoking.    + decreased breath sounds      Cardiovascular hypertension, + Peripheral Vascular Disease and +CHF  + dysrhythmias Atrial Fibrillation  Rhythm:Regular Rate:Normal     Neuro/Psych  PSYCHIATRIC DISORDERS Anxiety Depression    Panic attacks!Scoliosis. fibromyalgia  Neuromuscular disease    GI/Hepatic Neg liver ROS,GERD  ,,History noted Dr. Chilton Si   Endo/Other    Morbid obesity  Renal/GU negative Renal ROS     Musculoskeletal  (+)  Fibromyalgia -  Abdominal   Peds  Hematology Factor 5 Leiden mutation   Anesthesia Other Findings   Reproductive/Obstetrics                              Anesthesia Physical Anesthesia Plan  ASA: 3  Anesthesia Plan: General   Post-op Pain Management: Minimal or no pain anticipated   Induction: Intravenous  PONV Risk Score and Plan: Propofol infusion  Airway Management Planned: Nasal Cannula and Natural Airway  Additional Equipment: None  Intra-op Plan:   Post-operative Plan:   Informed Consent: I have reviewed the patients History and Physical, chart, labs and discussed the procedure including the risks, benefits and alternatives for the proposed anesthesia with the patient or authorized representative who has indicated his/her understanding and acceptance.     Dental advisory given  Plan Discussed with: CRNA  Anesthesia Plan Comments:         Anesthesia Quick Evaluation

## 2022-11-02 NOTE — Transfer of Care (Signed)
Immediate Anesthesia Transfer of Care Note  Patient: [name]  Procedure(s) Performed: COLONOSCOPY WITH PROPOFOL POLYPECTOMY BIOPSY  Patient Location: Short Stay  Anesthesia Type:General  Level of Consciousness: awake  Airway & Oxygen Therapy: Patient Spontanous Breathing  Post-op Assessment: Report given to RN  Post vital signs: Reviewed and stable  Last Vitals:  Vitals Value Taken Time  BP    Temp    Pulse    Resp    SpO2      Last Pain:  Vitals:   11/02/22 1151  TempSrc:   PainSc: 4          Complications: No notable events documented.

## 2022-11-02 NOTE — Anesthesia Postprocedure Evaluation (Signed)
Anesthesia Post Note  Patient: [name]  Procedure(s) Performed: COLONOSCOPY WITH PROPOFOL POLYPECTOMY BIOPSY  Patient location during evaluation: PACU Anesthesia Type: General Level of consciousness: awake and alert Pain management: pain level controlled Vital Signs Assessment: post-procedure vital signs reviewed and stable Respiratory status: spontaneous breathing, nonlabored ventilation, respiratory function stable and patient connected to nasal cannula oxygen Cardiovascular status: blood pressure returned to baseline and stable Postop Assessment: no apparent nausea or vomiting Anesthetic complications: no   There were no known notable events for this encounter.   Last Vitals:  Vitals:   11/02/22 1029 11/02/22 1224  BP: 129/89 (!) 100/56  Pulse: 72 65  Resp: 15 18  Temp: 36.9 C (!) 36.1 C  SpO2: 91% 94%    Last Pain:  Vitals:   11/02/22 1224  TempSrc: Axillary  PainSc: 0-No pain                 Johnica Armwood L Willean Schurman

## 2022-11-03 ENCOUNTER — Other Ambulatory Visit (HOSPITAL_COMMUNITY): Payer: Self-pay | Admitting: Physician Assistant

## 2022-11-03 LAB — SURGICAL PATHOLOGY

## 2022-11-09 ENCOUNTER — Encounter (HOSPITAL_COMMUNITY): Payer: Self-pay | Admitting: Internal Medicine

## 2022-11-09 ENCOUNTER — Other Ambulatory Visit: Payer: Self-pay | Admitting: Family Medicine

## 2022-11-09 NOTE — Progress Notes (Signed)
Noted  

## 2022-11-17 DIAGNOSIS — F329 Major depressive disorder, single episode, unspecified: Secondary | ICD-10-CM | POA: Diagnosis not present

## 2022-11-30 DIAGNOSIS — F411 Generalized anxiety disorder: Secondary | ICD-10-CM | POA: Diagnosis not present

## 2022-12-01 ENCOUNTER — Other Ambulatory Visit (HOSPITAL_COMMUNITY): Payer: Self-pay | Admitting: Cardiology

## 2022-12-01 NOTE — Patient Instructions (Signed)
        Great to see you today.  I have refilled the medication(s) we provide.    Lung Cancer Screening: Call our Nurse Navigator at [phone].    If labs were collected, we will inform you of lab results once received either by echart message or telephone call.   - echart message- for normal results that have been seen by the patient already.   - telephone call: abnormal results or if patient has not viewed results in their echart.   - Please take medications as prescribed. - Follow up with your primary health provider if any health concerns arises. - If symptoms worsen please contact your primary care provider and/or visit the emergency department.

## 2022-12-01 NOTE — Progress Notes (Unsigned)
New Patient Office Visit   Subjective   Patient ID: [name], female    DOB: 1970-02-08  Age: 52 y.o. MRN: [id number]  CC: No chief complaint on file.   HPI [name] 52 year old female, presents to establish care. She  has a past medical history of A-fib [facility]), ADHD (attention deficit hyperactivity disorder), Anxiety, Back pain, Chronic back pain (02/04/2013), COPD (chronic obstructive pulmonary disease) (HCC), Depression, Factor 5 Leiden mutation, heterozygous (HCC), Fibromyalgia, GERD (gastroesophageal reflux disease), Heart failure (HCC), Hypertension, Laryngitis, chronic, Panic attack, Pneumonia, Scoliosis, Sepsis (HCC) (05/2021), and Ulcer.  HPI    Outpatient Encounter Medications as of 12/02/2022  Medication Sig   albuterol (PROVENTIL HFA;VENTOLIN HFA) 108 (90 BASE) MCG/ACT inhaler Inhale 2 puffs into the lungs every 4 (four) hours as needed for wheezing or shortness of breath.   ALPRAZolam (XANAX) 0.5 MG tablet Take 0.5 mg by mouth 2 (two) times daily as needed.   amiodarone (PACERONE) 200 MG tablet Take 0.5 tablets (100 mg total) by mouth daily.   amitriptyline (ELAVIL) 25 MG tablet Take 25 mg by mouth at bedtime.   atorvastatin (LIPITOR) 20 MG tablet Take 1 tablet (20 mg total) by mouth daily.   bismuth subsalicylate (PEPTO BISMOL) 262 MG chewable tablet Chew 524 mg by mouth 3 (three) times daily as needed for indigestion.   budesonide (PULMICORT) 0.25 MG/2ML nebulizer solution Take 2 mLs (0.25 mg total) by nebulization 2 (two) times daily. (Patient taking differently: Take 0.25 mg by nebulization 2 (two) times daily. As needed)   celecoxib (CELEBREX) 200 MG capsule Take 200 mg by mouth daily.   citalopram (CELEXA) 20 MG tablet Take 20 mg by mouth daily. At night   ELIQUIS 5 MG TABS tablet TAKE ONE TABLET BY MOUTH 2 TIMES A DAY   FARXIGA 10 MG TABS tablet TAKE ONE TABLET BY MOUTH ONCE DAILY.   fluticasone (FLONASE) 50 MCG/ACT nasal spray Place 2 sprays into both  nostrils as needed for allergies or rhinitis.   furosemide (LASIX) 20 MG tablet Take 2 tablets (40 mg total) by mouth as needed. For weight gain of 3 lbs in 24 hours or 5 lbs in a week   ketoconazole (NIZORAL) 2 % shampoo Apply topically.   lubiprostone (AMITIZA) 24 MCG capsule Take 1 capsule (24 mcg total) by mouth 2 (two) times daily with a meal.   medroxyPROGESTERone (PROVERA) 10 MG tablet Take 1 tablet (10 mg total) by mouth daily. The first 10 days of each calendar month   methocarbamol (ROBAXIN) 750 MG tablet SMARTSIG:1 Tablet(s) By Mouth 1-4 Times Daily   pantoprazole (PROTONIX) 40 MG tablet Take 40 mg by mouth daily.   pregabalin (LYRICA) 200 MG capsule Take 200 mg by mouth 3 (three) times daily.   revefenacin (YUPELRI) 175 MCG/3ML nebulizer solution Take 3 mLs (175 mcg total) by nebulization daily. (Patient taking differently: Take 175 mcg by nebulization daily. As needed)   sacubitril-valsartan (ENTRESTO) 24-26 MG Take 1 tablet by mouth 2 (two) times daily. TAKE (1) TABLET BY MOUTH TWICE DAILY. PLEASE SCHEDULE FOLLOW UP FOR FURTHER REFILLS   spironolactone (ALDACTONE) 25 MG tablet Take 1 tablet (25 mg total) by mouth daily.   sucralfate (CARAFATE) 1 g tablet Take 1 tablet (1 g total) by mouth 2 (two) times daily.   TRINTELLIX 20 MG TABS tablet Take 20 mg by mouth daily.   Vitamin D, Ergocalciferol, (DRISDOL) 1.25 MG (50000 UNIT) CAPS capsule Take 50,000 Units by mouth 2 (two)  times a week.   vortioxetine HBr (TRINTELLIX) 10 MG TABS tablet Take 10 mg by mouth daily.   VYVANSE 50 MG capsule Take 50 mg by mouth every morning.   zolpidem (AMBIEN) 10 MG tablet Take 10 mg by mouth at bedtime.   No facility-administered encounter medications on file as of 12/02/2022.    Past Surgical History:  Procedure Laterality Date   BACK SURGERY     BIOPSY  11/02/2022   Procedure: BIOPSY;  Surgeon: Lanelle Bal, DO;  Location: [facility];  Service: Endoscopy;;   CARDIOVERSION N/A 05/30/2021    Procedure: CARDIOVERSION;  Surgeon: Laurey Morale, MD;  Location: [facility] ENDOSCOPY;  Service: Cardiovascular;  Laterality: N/A;   CESAREAN SECTION     CHOLECYSTECTOMY  11/27/2011   Procedure: LAPAROSCOPIC CHOLECYSTECTOMY;  Surgeon: Fabio Bering, MD;  Location: [facility] ORS;  Service: General;  Laterality: N/A;   COLONOSCOPY WITH PROPOFOL N/A 11/02/2022   Procedure: COLONOSCOPY WITH PROPOFOL;  Surgeon: Lanelle Bal, DO;  Location: [facility] END[facility];  Service: Endoscopy;  Laterality: N/A;  12:30 AM, ASA 3   CRYOTHERAPY  02/17/2021   Procedure: CRYOTHERAPY;  Surgeon: Lorin Glass, MD;  Location: [facility] ENDOSCOPY;  Service: Pulmonary;;   EMBOLECTOMY Left 12/06/2015   Procedure: EMBOLECTOMY LEFT ARM;  Surgeon: Sherren Kerns, MD;  Location: [facility] OR;  Service: Vascular;  Laterality: Left;   LEFT HEART CATH AND CORONARY ANGIOGRAPHY N/A 05/29/2021   Procedure: LEFT HEART CATH AND CORONARY ANGIOGRAPHY;  Surgeon: Laurey Morale, MD;  Location: [facility] INVASIVE CV LAB;  Service: Cardiovascular;  Laterality: N/A;   NECK SURGERY     POLYPECTOMY  11/02/2022   Procedure: POLYPECTOMY;  Surgeon: Lanelle Bal, DO;  Location: [facility];  Service: Endoscopy;;   TEE WITHOUT CARDIOVERSION N/A 05/30/2021   Procedure: TRANSESOPHAGEAL ECHOCARDIOGRAM (TEE);  Surgeon: Laurey Morale, MD;  Location: [facility] ENDOSCOPY;  Service: Cardiovascular;  Laterality: N/A;   TUBAL LIGATION     VIDEO BRONCHOSCOPY Right 02/17/2021   Procedure: VIDEO BRONCHOSCOPY WITHOUT FLUORO;  Surgeon: Lorin Glass, MD;  Location: [facility] ENDOSCOPY;  Service: Pulmonary;  Laterality: Right;  with cryo/ basket    ROS    Objective    LMP 09/14/2017 Comment: PT STATES SHE HAD HER TUBES CUT AND BURNT  Physical Exam    Assessment & Plan:  There are no diagnoses linked to this encounter.  No follow-ups on file.   Cruzita Lederer Newman Nip, FNP

## 2022-12-02 ENCOUNTER — Encounter: Payer: Self-pay | Admitting: Family Medicine

## 2022-12-02 ENCOUNTER — Ambulatory Visit: Payer: Medicaid Other | Admitting: Family Medicine

## 2022-12-02 VITALS — BP 111/71 | HR 75 | Ht 62.0 in | Wt 216.0 lb

## 2022-12-02 DIAGNOSIS — E038 Other specified hypothyroidism: Secondary | ICD-10-CM

## 2022-12-02 DIAGNOSIS — Z0100 Encounter for examination of eyes and vision without abnormal findings: Secondary | ICD-10-CM

## 2022-12-02 DIAGNOSIS — Z136 Encounter for screening for cardiovascular disorders: Secondary | ICD-10-CM

## 2022-12-02 DIAGNOSIS — Z0001 Encounter for general adult medical examination with abnormal findings: Secondary | ICD-10-CM | POA: Diagnosis not present

## 2022-12-02 DIAGNOSIS — E559 Vitamin D deficiency, unspecified: Secondary | ICD-10-CM

## 2022-12-02 DIAGNOSIS — Z23 Encounter for immunization: Secondary | ICD-10-CM

## 2022-12-02 DIAGNOSIS — R7301 Impaired fasting glucose: Secondary | ICD-10-CM

## 2022-12-02 DIAGNOSIS — D509 Iron deficiency anemia, unspecified: Secondary | ICD-10-CM

## 2022-12-02 DIAGNOSIS — Z122 Encounter for screening for malignant neoplasm of respiratory organs: Secondary | ICD-10-CM

## 2022-12-02 NOTE — Progress Notes (Signed)
Complete physical exam  Patient: [name]   DOB: Apr 22, 1970   52 y.o. Female  MRN: [id number]  Subjective:    Chief Complaint  Patient presents with   Establish Care    CPE, needs medications taken over since Dr. Sudie Bailey retired     [name] is a 52 y.o. female who presents today for a complete physical exam. She reports consuming a  high sodium  diet. The patient does not participate in regular exercise at present. She generally feels okay. She reports sleeping fairly well. She does have additional problems to discuss today.    Most recent fall risk assessment:    06/18/2022    2:52 PM  Fall Risk   Falls in the past year? 1  Number falls in past yr: 1  Injury with Fall? 0     Most recent depression screenings:    12/02/2022    3:30 PM 06/18/2022    2:17 PM  PHQ 2/9 Scores  PHQ - 2 Score 4 3  PHQ- 9 Score 16 9    Vision:Not within last year  and Dental: No current dental problems and No regular dental care   Patient Care Team: Del Newman Nip, Tenna Child, FNP as PCP - General (Family Medicine)   Outpatient Medications Prior to Visit  Medication Sig   albuterol (PROVENTIL HFA;VENTOLIN HFA) 108 (90 BASE) MCG/ACT inhaler Inhale 2 puffs into the lungs every 4 (four) hours as needed for wheezing or shortness of breath.   ALPRAZolam (XANAX) 0.5 MG tablet Take 0.5 mg by mouth 2 (two) times daily as needed.   amiodarone (PACERONE) 200 MG tablet Take 0.5 tablets (100 mg total) by mouth daily.   amitriptyline (ELAVIL) 25 MG tablet Take 25 mg by mouth at bedtime.   atorvastatin (LIPITOR) 20 MG tablet Take 1 tablet (20 mg total) by mouth daily.   bismuth subsalicylate (PEPTO BISMOL) 262 MG chewable tablet Chew 524 mg by mouth 3 (three) times daily as needed for indigestion.   budesonide (PULMICORT) 0.25 MG/2ML nebulizer solution Take 2 mLs (0.25 mg total) by nebulization 2 (two) times daily. (Patient taking differently: Take 0.25 mg by nebulization 2 (two) times daily.  As needed)   celecoxib (CELEBREX) 200 MG capsule Take 200 mg by mouth daily.   citalopram (CELEXA) 20 MG tablet Take 20 mg by mouth daily. At night   ELIQUIS 5 MG TABS tablet TAKE ONE TABLET BY MOUTH 2 TIMES A DAY   FARXIGA 10 MG TABS tablet TAKE ONE TABLET BY MOUTH ONCE DAILY.   fluticasone (FLONASE) 50 MCG/ACT nasal spray Place 2 sprays into both nostrils as needed for allergies or rhinitis.   furosemide (LASIX) 20 MG tablet Take 2 tablets (40 mg total) by mouth as needed. For weight gain of 3 lbs in 24 hours or 5 lbs in a week   GAVILYTE-G 236 g solution Take 4,000 mLs by mouth once.   ketoconazole (NIZORAL) 2 % shampoo Apply topically.   lubiprostone (AMITIZA) 24 MCG capsule Take 1 capsule (24 mcg total) by mouth 2 (two) times daily with a meal.   medroxyPROGESTERone (PROVERA) 10 MG tablet Take 1 tablet (10 mg total) by mouth daily. The first 10 days of each calendar month   methocarbamol (ROBAXIN) 750 MG tablet SMARTSIG:1 Tablet(s) By Mouth 1-4 Times Daily   pantoprazole (PROTONIX) 40 MG tablet Take 40 mg by mouth daily.   pregabalin (LYRICA) 200 MG capsule Take 200 mg by mouth 3 (three) times daily.  revefenacin (YUPELRI) 175 MCG/3ML nebulizer solution Take 3 mLs (175 mcg total) by nebulization daily. (Patient taking differently: Take 175 mcg by nebulization daily. As needed)   sacubitril-valsartan (ENTRESTO) 24-26 MG Take 1 tablet by mouth 2 (two) times daily. TAKE (1) TABLET BY MOUTH TWICE DAILY. PLEASE SCHEDULE FOLLOW UP FOR FURTHER REFILLS   spironolactone (ALDACTONE) 25 MG tablet Take 1 tablet (25 mg total) by mouth daily.   sucralfate (CARAFATE) 1 g tablet Take 1 tablet (1 g total) by mouth 2 (two) times daily.   TRINTELLIX 20 MG TABS tablet Take 20 mg by mouth daily.   Vitamin D, Ergocalciferol, (DRISDOL) 1.25 MG (50000 UNIT) CAPS capsule Take 50,000 Units by mouth 2 (two) times a week.   VYVANSE 50 MG capsule Take 50 mg by mouth every morning.   zolpidem (AMBIEN) 10 MG tablet Take  10 mg by mouth at bedtime.   [DISCONTINUED] vortioxetine HBr (TRINTELLIX) 10 MG TABS tablet Take 10 mg by mouth daily.   No facility-administered medications prior to visit.    Review of Systems  Constitutional:  Negative for chills and fever.  Respiratory:  Negative for shortness of breath.   Cardiovascular:  Negative for chest pain.  Gastrointestinal:  Negative for abdominal pain.  Genitourinary:  Negative for dysuria.  Neurological:  Negative for dizziness and headaches.       Objective:    BP 111/71   Pulse 75   Ht 5\' 2"  (1.575 m)   Wt 216 lb (98 kg)   LMP 09/14/2017 Comment: PT STATES SHE HAD HER TUBES CUT AND BURNT  SpO2 (!) 86%   BMI 39.51 kg/m  BP Readings from Last 3 Encounters:  12/02/22 111/71  11/02/22 (!) 100/56  08/20/22 110/70      Physical Exam Vitals reviewed.  Constitutional:      General: She is not in acute distress.    Appearance: Normal appearance. She is not ill-appearing, toxic-appearing or diaphoretic.  HENT:     Head: Normocephalic.     Right Ear: Tympanic membrane normal.     Left Ear: Tympanic membrane normal.     Nose: Nose normal.     Mouth/Throat:     Mouth: Mucous membranes are moist.  Eyes:     General:        Right eye: No discharge.        Left eye: No discharge.     Conjunctiva/sclera: Conjunctivae normal.     Pupils: Pupils are equal, round, and reactive to light.  Cardiovascular:     Rate and Rhythm: Normal rate.     Pulses: Normal pulses.     Heart sounds: Normal heart sounds.  Pulmonary:     Effort: Pulmonary effort is normal. No respiratory distress.     Breath sounds: Normal breath sounds.  Abdominal:     General: Bowel sounds are normal.     Palpations: Abdomen is soft.     Tenderness: There is no abdominal tenderness. There is no right CVA tenderness, left CVA tenderness or guarding.  Skin:    General: Skin is warm.     Capillary Refill: Capillary refill takes less than 2 seconds.  Neurological:     Mental  Status: She is alert.     Coordination: Coordination normal.     Gait: Gait normal.  Psychiatric:        Mood and Affect: Mood normal.        Behavior: Behavior normal.      No results found  for any visits on 12/02/22.    Assessment & Plan:    Routine Health Maintenance and Physical Exam  There is no immunization history for the selected administration types on file for this patient.  Health Maintenance  Topic Date Due   Lung Cancer Screening  Never done   Zoster Vaccines- Shingrix (1 of 2) Never done   COVID-19 Vaccine (1 - 2023-24 season) Never done   INFLUENZA VACCINE  04/12/2023 (Originally 08/13/2022)   DTaP/Tdap/Td (1 - Tdap) 12/02/2023 (Originally 01/13/1990)   MAMMOGRAM  05/10/2024   Cervical Cancer Screening (HPV/Pap Cotest)  06/18/2027   Colonoscopy  11/01/2032   Hepatitis C Screening  Completed   HIV Screening  Completed   HPV VACCINES  Aged Out    Discussed health benefits of physical activity, and encouraged her to engage in regular exercise appropriate for her age and condition.  Encounter for screening for cardiovascular disorders  Screening for lung cancer  Vitamin D deficiency  TSH (thyroid-stimulating hormone deficiency)  IFG (impaired fasting glucose)  Need for shingles vaccine  Encounter for routine adult physical exam with abnormal findings Assessment & Plan: A comprehensive physical examination was completed, and necessary labs were ordered. Screening and health maintenance recommendations have been updated. The patient received counseling on exercise and nutrition. BMI was assessed and discussed Advise for heart health, focus on: Eat more fruits and vegetables: Aim for a variety of colors. Choose whole grains: Brown rice, oats, and whole-wheat bread. Limit unhealthy fats: Avoid trans fats; use olive or avocado oil instead. Include lean proteins: Opt for fish, chicken, beans, and legumes. Reduce sodium: Limit processed foods and add less  salt. Stay hydrated: Drink plenty of water. Exercise regularly: Aim for at least 30 minutes of moderate exercise, like walking or cycling, 5 days a week.       Return in about 4 months (around 04/01/2023), or if symptoms worsen or fail to improve, for chronic follow-up.     Cruzita Lederer Newman Nip, FNP

## 2022-12-02 NOTE — Assessment & Plan Note (Signed)

## 2022-12-07 DIAGNOSIS — F411 Generalized anxiety disorder: Secondary | ICD-10-CM | POA: Diagnosis not present

## 2022-12-14 ENCOUNTER — Encounter: Payer: Self-pay | Admitting: *Deleted

## 2022-12-14 ENCOUNTER — Ambulatory Visit: Payer: Medicaid Other | Admitting: Gastroenterology

## 2022-12-14 ENCOUNTER — Telehealth: Payer: Self-pay | Admitting: *Deleted

## 2022-12-14 ENCOUNTER — Encounter: Payer: Self-pay | Admitting: Gastroenterology

## 2022-12-14 VITALS — BP 123/78 | HR 86 | Temp 98.3°F | Ht 62.0 in | Wt 219.2 lb

## 2022-12-14 DIAGNOSIS — Z8719 Personal history of other diseases of the digestive system: Secondary | ICD-10-CM

## 2022-12-14 DIAGNOSIS — K838 Other specified diseases of biliary tract: Secondary | ICD-10-CM | POA: Insufficient documentation

## 2022-12-14 DIAGNOSIS — R1310 Dysphagia, unspecified: Secondary | ICD-10-CM | POA: Insufficient documentation

## 2022-12-14 DIAGNOSIS — K5900 Constipation, unspecified: Secondary | ICD-10-CM

## 2022-12-14 DIAGNOSIS — R7989 Other specified abnormal findings of blood chemistry: Secondary | ICD-10-CM

## 2022-12-14 DIAGNOSIS — K219 Gastro-esophageal reflux disease without esophagitis: Secondary | ICD-10-CM

## 2022-12-14 NOTE — Patient Instructions (Signed)
Please complete MRI of your liver and bile duct as you can.  Update labs as discussed. Continue lubiprostone and pantoprazole as before.  Upper endoscopy to be scheduled to evaluate your swallowing concerns and due to chronic acid reflux.

## 2022-12-14 NOTE — Progress Notes (Signed)
GI Office Note    Referring Provider: Medicine, Lolita Lenz* Primary Care Physician:  Rica Records, FNP  Primary Gastroenterologist: Hennie Duos. Marletta Lor, DO   Chief Complaint   Chief Complaint  Patient presents with   Follow-up    History of Present Illness   [name] is a 52 y.o. female presenting today for follow up. Seen in 06/2022 for positive Cologuard.   She has history of chronic constipation, lactose intolerlance. History of diffuse hepatic steatosis by CT 2022 with elevated LFTs.  Today: Reflux well controlled on pantoprazole. BM regular on lubiprostone BID prn. No melena, brbpr. No abdominal pain. She has been having issues with swallowing. Sometimes just hard to start swallowing or gets choked. She is able to swallow her medication without any problems. Notes foods gets to her throat and don't want to go down, sometimes feels food stuck in her chest. She does not have any teeth so she is very careful to cut foods up and/or avoid tough foods. She has never had an EGD. She has had reflux for many years. She notes concerns about her liver and possible advanced liver disease with lobular contours of the liver noted on u/s. She has never consumed significant etoh. She used to be on pain medications for over 10 years, quit 2 years ago.   Labs completed in 08/2022: Iron 208 high, iron sats 57 high, ferritin 19, TIBC 367, white blood cell count 8900, hemoglobin 16.8, platelets 153,000, creatinine 0.81, smooth muscle antibody negative, mitochondrial antibody less than 20 negative, GGT slightly elevated at 66, ANA negative, hepatitis B surface antibody reactive, hepatitis B surface antigen negative, hepatitis C antibody negative, hepatitis A total antibody negative, IgA 432, IgG 984, IgM 111, tTG-IgA less than 2, albumin 3.8, total bilirubin 0.5, alkaline phosphatase 149, AST 33, ALT 28  RUQ U/S:  IMPRESSION: Heterogeneous hepatic parenchymal echogenicity with  hepatomegaly and suggestion of lobulated contours. This is nonspecific but can be seen in the setting of hepatic steatosis or underlying liver disease. Recommend correlation with LFTs. CBD visualized portion is 12mm likely due to reservoir effect s/p cholecystectomy.   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 1 Inhaler 0   ALPRAZolam (XANAX) 0.5 MG tablet Take 0.5 mg by mouth 2 (two) times daily as needed.     amiodarone (PACERONE) 200 MG tablet Take 0.5 tablets (100 mg total) by mouth daily. 30 tablet 5   amitriptyline (ELAVIL) 25 MG tablet Take 25 mg by mouth at bedtime.     atorvastatin (LIPITOR) 20 MG tablet Take 1 tablet (20 mg total) by mouth daily. 90 tablet 3   celecoxib (CELEBREX) 200 MG capsule Take 200 mg by mouth daily.     citalopram (CELEXA) 20 MG tablet Take 20 mg by mouth daily. At night     ELIQUIS 5 MG TABS tablet TAKE ONE TABLET BY MOUTH 2 TIMES A DAY 60 tablet 5   FARXIGA 10 MG TABS tablet TAKE ONE TABLET BY MOUTH ONCE DAILY. 30 tablet 2   fluticasone (FLONASE) 50 MCG/ACT nasal spray  Place 2 sprays into both nostrils as needed for allergies or rhinitis.     ketoconazole (NIZORAL) 2 % shampoo Apply topically.     lubiprostone (AMITIZA) 24 MCG capsule Take 1 capsule (24 mcg total) by mouth 2 (two) times daily with a meal. 60 capsule 5   methocarbamol (ROBAXIN) 750 MG tablet SMARTSIG:1 Tablet(s) By Mouth 1-4 Times Daily     pantoprazole (PROTONIX) 40 MG tablet Take 40 mg by mouth daily.     pregabalin  (LYRICA) 200 MG capsule Take 200 mg by mouth 3 (three) times daily.     revefenacin (YUPELRI) 175 MCG/3ML nebulizer solution Take 3 mLs (175 mcg total) by nebulization daily. (Patient taking differently: Take 175 mcg by nebulization daily. As needed) 90 mL 0   sacubitril-valsartan (ENTRESTO) 24-26 MG Take 1 tablet by mouth 2 (two) times daily. TAKE (1) TABLET BY MOUTH TWICE DAILY. PLEASE SCHEDULE FOLLOW UP FOR FURTHER REFILLS 60 tablet 2   spironolactone (ALDACTONE) 25 MG tablet Take 1 tablet (25 mg total) by mouth daily. 90 tablet 3   sucralfate (CARAFATE) 1 g tablet Take 1 tablet (1 g total) by mouth 2 (two) times daily. 30 tablet 0   TRINTELLIX 20 MG TABS tablet Take 20 mg by mouth daily.     Vitamin D, Ergocalciferol, (DRISDOL) 1.25 MG (50000 UNIT) CAPS capsule Take 50,000 Units by mouth 2 (two) times a week.     VYVANSE 50 MG capsule Take 50 mg by mouth every morning.     zolpidem (AMBIEN) 10 MG tablet Take 10 mg by mouth at bedtime.     No current facility-administered medications for this visit.    Allergies   Allergies as of 12/14/2022 - Review Complete 12/14/2022  Allergen Reaction Noted   Penicillins Anaphylaxis 07/24/2010   Ibuprofen Nausea And Vomiting 10/23/2010   Aspirin Nausea And Vomiting 10/23/2010     Past Medical History   Past Medical History:  Diagnosis Date   A-fib [facility])    ADHD (attention deficit hyperactivity disorder)    Allergy    Anxiety    Back pain    CHF (congestive heart failure) (HCC)    Chronic back pain 02/04/2013   By patient report   COPD (chronic obstructive pulmonary disease) (HCC)    Depression    Factor 5 Leiden mutation, heterozygous (HCC)    Fibromyalgia    GERD (gastroesophageal reflux disease)    Heart failure (HCC)    Hypertension    Laryngitis, chronic    Myocardial infarction (HCC)    Panic attack    Pneumonia    Scoliosis    Sepsis (HCC) 05/2021   Ulcer     Past Surgical History   Past Surgical History:  Procedure  Laterality Date   BACK SURGERY     BIOPSY  11/02/2022   Procedure: BIOPSY;  Surgeon: Lanelle Bal, DO;  [facility];  Service: Endoscopy;;   CARDIOVERSION N/A 05/30/2021   Procedure: CARDIOVERSION;  Surgeon: Laurey Morale, MD;  Location: [facility] ENDOSCOPY;  Service: Cardiovascular;  Laterality: N/A;   CESAREAN SECTION     CHOLECYSTECTOMY  11/27/2011   Procedure: LAPAROSCOPIC CHOLECYSTECTOMY;  Surgeon: Fabio Bering, MD;  Location: [facility] ORS;  Service: General;  Laterality: N/A;   COLONOSCOPY WITH PROPOFOL N/A 11/02/2022   Procedure: COLONOSCOPY WITH PROPOFOL;  Surgeon: Lanelle Bal, DO;  Location: [facility];  Service: Endoscopy;  Laterality: N/A;  12:30 AM, ASA 3   CRYOTHERAPY  02/17/2021  Procedure: CRYOTHERAPY;  Surgeon: Lorin Glass, MD;  Location[facility] ENDOSCOPY;  Service: Pulmonary;;   EMBOLECTOMY Left 12/06/2015   Procedure: EMBOLECTOMY LEFT ARM;  Surgeon: Sherren Kerns, MD;  Location: [facility] OR;  Service: Vascular;  Laterality: Left;   LEFT HEART CATH AND CORONARY ANGIOGRAPHY N/A 05/29/2021   Procedure: LEFT HEART CATH AND CORONARY ANGIOGRAPHY;  Surgeon: Laurey Morale, MD;  Location: [facility] INVASIVE CV LAB;  Service: Cardiovascular;  Laterality: N/A;   NECK SURGERY     POLYPECTOMY  11/02/2022   Procedure: POLYPECTOMY;  Surgeon: Lanelle Bal, DO;  Location: [facility];  Service: Endoscopy;;   TEE WITHOUT CARDIOVERSION N/A 05/30/2021   Procedure: TRANSESOPHAGEAL ECHOCARDIOGRAM (TEE);  Surgeon: Laurey Morale, MD;  Location: [facility] ENDOSCOPY;  Service: Cardiovascular;  Laterality: N/A;   TUBAL LIGATION     VIDEO BRONCHOSCOPY Right 02/17/2021   Procedure: VIDEO BRONCHOSCOPY WITHOUT FLUORO;  Surgeon: Lorin Glass, MD;  Location: [facility] ENDOSCOPY;  Service: Pulmonary;  Laterality: Right;  with cryo/ basket    Past Family History   Family History  Problem Relation Age of Onset   Other Sister        choked on a biscuit   Other Brother        brittle bone disease    Cancer Brother    Aortic stenosis Son    Anxiety disorder Son    Depression Son    Atrial fibrillation Son    Heart disease Other    Arthritis Other    Lung disease Other    Cancer Other    Asthma Other    Diabetes Other    Colon cancer Neg Hx     Past Social History   Social History   Socioeconomic History   Marital status: Divorced    Spouse name: Not on file   Number of children: Not on file   Years of education: Not on file   Highest education level: 7th grade  Occupational History   Occupation: disabled  Tobacco Use   Smoking status: Every Day    Current packs/day: 1.50    Average packs/day: 1.5 packs/day for 25.0 years (37.5 ttl pk-yrs)    Types: Cigarettes   Smokeless tobacco: Never  Vaping Use   Vaping status: Never Used  Substance and Sexual Activity   Alcohol use: No   Drug use: No   Sexual activity: Not Currently    Birth control/protection: Surgical    Comment: tubal  Other Topics Concern   Not on file  Social History Narrative   Not on file   Social Determinants of Health   Financial Resource Strain: Low Risk  (11/28/2022)   Overall Financial Resource Strain (CARDIA)    Difficulty of Paying Living Expenses: Not very hard  Food Insecurity: Food Insecurity Present (11/28/2022)   Hunger Vital Sign    Worried About Running Out of Food in the Last Year: Sometimes true    Ran Out of Food in the Last Year: Sometimes true  Transportation Needs: No Transportation Needs (11/28/2022)   PRAPARE - Administrator, Civil Service (Medical): No    Lack of Transportation (Non-Medical): No  Physical Activity: Inactive (11/28/2022)   Exercise Vital Sign    Days of Exercise per Week: 0 days    Minutes of Exercise per Session: 0 min  Stress: Stress Concern Present (11/28/2022)   [employer] of Occupational Health - Occupational Stress Questionnaire    Feeling of Stress : Rather much  Social Connections: Moderately Isolated (11/28/2022)    Social Connection and Isolation Panel [NHANES]    Frequency of Communication with Friends and Family: Three times a week    Frequency of Social Gatherings with Friends and Family: Never    Attends Religious Services: Never    Database administrator or Organizations: No    Attends Banker Meetings: Never    Marital Status: Living with partner  Intimate Partner Violence: Not At Risk (06/18/2022)   Humiliation, Afraid, Rape, and Kick questionnaire    Fear of Current or Ex-Partner: No    Emotionally Abused: No    Physically Abused: No    Sexually Abused: No    Review of Systems   General: Negative for anorexia, weight loss, fever, chills, fatigue, weakness. ENT: Negative for hoarseness,   nasal congestion. See hpi CV: Negative for chest pain, angina, palpitations, dyspnea on exertion, peripheral edema.  Respiratory: Negative for dyspnea at rest, dyspnea on exertion, cough, sputum, wheezing.  GI: See history of present illness. GU:  Negative for dysuria, hematuria, urinary incontinence, urinary frequency, nocturnal urination.  Endo: Negative for unusual weight change.     Physical Exam   BP 123/78 (BP Location: Right Arm, Patient Position: Sitting, Cuff Size: Large)   Pulse 86   Temp 98.3 F (36.8 C) (Oral)   Ht 5\' 2"  (1.575 m)   Wt 219 lb 3.2 oz (99.4 kg)   LMP 09/14/2017 Comment: PT STATES SHE HAD HER TUBES CUT AND BURNT  SpO2 92%   BMI 40.09 kg/m    General: Well-nourished, well-developed in no acute distress.  Eyes: No icterus. Mouth: Oropharyngeal mucosa moist and pink   Lungs: Clear to auscultation bilaterally.  Heart: Regular rate and rhythm, no murmurs rubs or gallops.  Abdomen: Bowel sounds are normal, nontender, nondistended, no hepatosplenomegaly or masses,  no abdominal bruits or hernia , no rebound or guarding.  Rectal: not performed  Extremities: No lower extremity edema. No clubbing or deformities. Neuro: Alert and oriented x 4   Skin: Warm and  dry, no jaundice.   Psych: Alert and cooperative, normal mood and affect.  Labs   See hpi Imaging Studies   No results found.  Assessment/Plan:   Dilated CBD/elevated LFTs: -Mildly elevated alk phos, extensive serologies as outlined. Iron/iron sats elevated with normal ferritin, hemochromatosis less likely but will check HFE genetic markers. GGT elevated slightly. AMA negative. Need to rule out CBD stones, malignancy although suspect dilated CBD (12mm) may be related to prior cholecystectomy and prior chronic narcotic use.  -MRCP -FIB 4 of 2.08 concerning for advanced fibrosis, await EGD findings to see if any findings s/o portal hypertension, also further liver imaging at time of MRCP  Constipation: -doing well on lubiprostone  GERD: -doing well on PPI, with concern for dysphagia, may be multifactorial -given no prior EGD and dysphagia, plan for EGD/ED in near future. Hold Eliquis 48 hours before.  I have discussed the risks, alternatives, benefits with regards to but not limited to the risk of reaction to medication, bleeding, infection, perforation and the patient is agreeable to proceed. Written consent to be obtained.     Leanna Battles. Melvyn Neth, MHS, PA-C [facility]

## 2022-12-14 NOTE — Telephone Encounter (Signed)
Carelon PA:   Order ID: [id number]       Authorized  Approval Valid Through: 12/14/2022 - 02/11/2023

## 2022-12-16 DIAGNOSIS — F411 Generalized anxiety disorder: Secondary | ICD-10-CM | POA: Diagnosis not present

## 2022-12-18 ENCOUNTER — Telehealth (HOSPITAL_COMMUNITY): Payer: Self-pay

## 2022-12-18 NOTE — Telephone Encounter (Signed)
Called pt and was unable to leave patient a voice message to confirm/remind patient of their appointment at the [facility] on 12/21/22.

## 2022-12-21 ENCOUNTER — Telehealth: Payer: Self-pay | Admitting: *Deleted

## 2022-12-21 ENCOUNTER — Encounter (HOSPITAL_COMMUNITY): Payer: Medicaid Other

## 2022-12-21 ENCOUNTER — Encounter (HOSPITAL_COMMUNITY): Payer: Self-pay

## 2022-12-21 ENCOUNTER — Ambulatory Visit (HOSPITAL_COMMUNITY): Payer: Medicaid Other

## 2022-12-21 NOTE — Telephone Encounter (Signed)
LMOVM to call back to schedule EGD/ED with Dr. Samantha Crimes , hold farxiga x 3 days prior, no eliquis x 2 days prior.

## 2022-12-24 ENCOUNTER — Ambulatory Visit (HOSPITAL_COMMUNITY): Admission: RE | Admit: 2022-12-24 | Payer: Medicaid Other | Source: Ambulatory Visit

## 2022-12-28 DIAGNOSIS — F411 Generalized anxiety disorder: Secondary | ICD-10-CM | POA: Diagnosis not present

## 2023-01-12 DIAGNOSIS — F411 Generalized anxiety disorder: Secondary | ICD-10-CM | POA: Diagnosis not present

## 2023-01-25 ENCOUNTER — Other Ambulatory Visit: Payer: Self-pay | Admitting: Family Medicine

## 2023-01-25 NOTE — Telephone Encounter (Signed)
 Copied from CRM [id number]. Topic: Clinical - Medication Refill >> Jan 25, 2023  3:32 PM Zebedee SAUNDERS wrote: Most Recent Primary Care Visit:  Provider: [name]  Department: [facility]  Visit Type: NEW PATIENT  Date: 12/02/2022  Medication: pregabalin  (LYRICA ) 200 MG capsule and   Has the patient contacted their pharmacy? Yes (Agent: If no, request that the patient contact the pharmacy for the refill. If patient does not wish to contact the pharmacy document the reason why and proceed with request.) (Agent: If yes, when and what did the pharmacy advise?)  Is this the correct pharmacy for this prescription? Yes If no, delete pharmacy and type the correct one.  This is the patient's preferred pharmacy:  [facility] - [place], [place] - [address] [place] [address] Phone: [phone] Fax: [phone]   Has the prescription been filled recently? Yes  Is the patient out of the medication? Yes  Has the patient been seen for an appointment in the last year OR does the patient have an upcoming appointment? Yes  Can we respond through MyChart? Yes  Agent: Please be advised that Rx refills may take up to 3 business days. We ask that you follow-up with your pharmacy.

## 2023-01-27 ENCOUNTER — Other Ambulatory Visit: Payer: Self-pay | Admitting: Family Medicine

## 2023-01-27 NOTE — Telephone Encounter (Unsigned)
 Copied from CRM [id number]. Topic: Clinical - Medication Refill >> Jan 27, 2023  2:34 PM Baldomero Bone wrote: Most Recent Primary Care Visit:  Provider: Rosanna Comment  Department: [facility]  Visit Type: NEW PATIENT  Date: 12/02/2022  Medication: pregabalin  (LYRICA ) 200 MG capsule  Has the patient contacted their pharmacy? Yes (Agent: If no, request that the patient contact the pharmacy for the refill. If patient does not wish to contact the pharmacy document the reason why and proceed with request.) (Agent: If yes, when and what did the pharmacy advise?)  Is this the correct pharmacy for this prescription? Yes If no, delete pharmacy and type the correct one.  This is the patient's preferred pharmacy:  [facility] - [place], [place] - [address] [place] [address] Phone: [phone] Fax: [phone]  [facility]   Has the prescription been filled recently? No  Is the patient out of the medication? Yes  Has the patient been seen for an appointment in the last year OR does the patient have an upcoming appointment? Yes  Can we respond through MyChart? No  Agent: Please be advised that Rx refills may take up to 3 business days. We ask that you follow-up with your pharmacy.

## 2023-01-27 NOTE — Telephone Encounter (Signed)
Copied from CRM [id number]. Topic: Clinical - Prescription Issue >> Jan 27, 2023 11:49 AM Clayton Bibles wrote: Reason for CRM: pregabalin (LYRICA) 200 MG capsule - She just ran out of this medication and pharmacy has not received the refill/authorization. Please send in refill or give Samyra a call

## 2023-01-28 ENCOUNTER — Other Ambulatory Visit: Payer: Self-pay | Admitting: Family Medicine

## 2023-01-28 MED ORDER — PREGABALIN 200 MG PO CAPS: 200.0000 mg | ORAL_CAPSULE | Freq: Three times a day (TID) | ORAL | 0 refills | Status: DC

## 2023-01-29 ENCOUNTER — Encounter (HOSPITAL_COMMUNITY): Payer: Self-pay | Admitting: Cardiology

## 2023-01-29 ENCOUNTER — Ambulatory Visit (HOSPITAL_COMMUNITY)
Admission: RE | Admit: 2023-01-29 | Discharge: 2023-01-29 | Disposition: A | Discharge status: Home / Self Care | Payer: Medicaid Other | Source: Ambulatory Visit | Attending: Family Medicine | Admitting: Family Medicine

## 2023-01-29 ENCOUNTER — Ambulatory Visit (HOSPITAL_COMMUNITY)
Admission: RE | Admit: 2023-01-29 | Discharge: 2023-01-29 | Disposition: A | Discharge status: Home / Self Care | Payer: Medicaid Other | Source: Ambulatory Visit | Attending: Cardiology | Admitting: Cardiology

## 2023-01-29 VITALS — BP 104/70 | HR 73 | Wt 221.6 lb

## 2023-01-29 DIAGNOSIS — F1510 Other stimulant abuse, uncomplicated: Secondary | ICD-10-CM | POA: Insufficient documentation

## 2023-01-29 DIAGNOSIS — F419 Anxiety disorder, unspecified: Secondary | ICD-10-CM | POA: Insufficient documentation

## 2023-01-29 DIAGNOSIS — I4892 Unspecified atrial flutter: Secondary | ICD-10-CM | POA: Insufficient documentation

## 2023-01-29 DIAGNOSIS — I361 Nonrheumatic tricuspid (valve) insufficiency: Secondary | ICD-10-CM | POA: Insufficient documentation

## 2023-01-29 DIAGNOSIS — R9431 Abnormal electrocardiogram [ECG] [EKG]: Secondary | ICD-10-CM | POA: Diagnosis not present

## 2023-01-29 DIAGNOSIS — I5020 Unspecified systolic (congestive) heart failure: Secondary | ICD-10-CM

## 2023-01-29 DIAGNOSIS — I428 Other cardiomyopathies: Secondary | ICD-10-CM | POA: Diagnosis not present

## 2023-01-29 DIAGNOSIS — I5022 Chronic systolic (congestive) heart failure: Secondary | ICD-10-CM | POA: Diagnosis not present

## 2023-01-29 DIAGNOSIS — F17210 Nicotine dependence, cigarettes, uncomplicated: Secondary | ICD-10-CM | POA: Insufficient documentation

## 2023-01-29 DIAGNOSIS — F32A Depression, unspecified: Secondary | ICD-10-CM | POA: Diagnosis not present

## 2023-01-29 LAB — ECHOCARDIOGRAM COMPLETE: Area-P 1/2: 2.87 cm2

## 2023-01-29 MED ORDER — ENTRESTO 24-26 MG PO TABS: 1.0000 | ORAL_TABLET | Freq: Two times a day (BID) | ORAL | 11 refills | Status: DC

## 2023-01-29 NOTE — Patient Instructions (Signed)
STOP Amiodarone   Your physician recommends that you schedule a follow-up appointment in: 6 months ( July) ** PLEASE CALL THE OFFICE IN MAY TO ARRANGE YOUR FOLLOW UP APPOINTMENT. **  If you have any questions or concerns before your next appointment please send [place] a message through [place] or call our office at [phone].    TO LEAVE A MESSAGE FOR THE NURSE SELECT OPTION 2, PLEASE LEAVE A MESSAGE INCLUDING: YOUR NAME DATE OF BIRTH CALL BACK NUMBER REASON FOR CALL**this is important as we prioritize the call backs  YOU WILL RECEIVE A CALL BACK THE SAME DAY AS LONG AS YOU CALL BEFORE 4:00 PM  At the [facility], you and your health needs are our priority. As part of our continuing mission to provide you with exceptional heart care, we have created designated Provider Care Teams. These Care Teams include your primary Cardiologist (physician) and Advanced Practice Providers (APPs- Physician Assistants and Nurse Practitioners) who all work together to provide you with the care you need, when you need it.   You may see any of the following providers on your designated Care Team at your next follow up: Dr Arvilla Meres Dr Marca Ancona Dr. Dorthula Nettles Dr. Clearnce Hasten Amy Filbert Schilder, NP Robbie Lis, [place] [facility] [place], [place] Brynda Peon, NP [place] Lee, NP Karle Plumber, PharmD   Please be sure to bring in all your medications bottles to every appointment.    Thank you for choosing [facility]

## 2023-01-29 NOTE — Progress Notes (Signed)
ADVANCED HEART FAILURE FOLLOW UP CLINIC NOTE  Referring Physician: Wylene Men*  Primary Care: Rica Records, FNP Primary Cardiologist:  HPI: [name] is a 53 y.o. female with a PMH of h/o chronic pain, anxiety, depression, substance abuse, A flutter, and HFrEF who presents for follow up of chronic heart failure with improved EF.      Admitted 02/23 with acute respiratory failure 2/2 strep pneumo and aspiration pneumonia. Found unresponsive and required intubation. Had AFL with RVR. Left AMA.   Admitted 05/16/21 with respiratory failure + A flutter. Found unresponsive. UDS + benzos & meth. Requiring several days of mechanical inhalation.  Echo with newly reduced EF 20-25% and RV severely reduced.  cMRI with LVEF 29%, RVEF 32%, no definite LGE. 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 No significant CAD on cath. Cardioverted back to NSR.  GDMT started for heart failure.    7/23 Entresto/spiro stopped due to hyperkalemia (later added back). BB and dig stopped due to bradycardia.    Echo 12/23: EF 55-60%, RV okay     SUBJECTIVE:  Overall doing well, discussed normal echocardiogram.  She has had a flare of her fibromyalgia but has been doing well from a cardiac standpoint.  Denies any shortness of breath, palpitations, chest pain.  PMH, current medications, allergies, social history, and family history reviewed in epic.  PHYSICAL EXAM: Vitals:   01/29/23 1435  BP: 104/70  Pulse: 73  SpO2: 90%   GENERAL: Chronically ill-appearing HEENT: The mucous membranes are pink and moist.   PULM:  Normal work of breathing, clear to auscultation bilaterally. Respirations are unlabored.  CARDIAC:  JVP: Not elevated         Normal rate with regular rhythm. No murmurs, rubs or gallops.  No edema.  ABDOMEN: Soft, non-tender, non-distended. NEUROLOGIC: Patient is oriented x3 with no focal or lateralizing  neurologic deficits.  PSYCH: Patients affect is appropriate, there is no evidence of anxiety or depression.  SKIN: Warm and dry; no lesions or wounds. Warm and well perfused extremities.  DATA REVIEW  Cardiac Testing  - Echo (12/23): EF 55-60%, RV okay   - Echo (5/23): moderate LV dilation with EF 20-25%, moderately decreased RV systolic function.     - LHC (1/61): no significant CAD   ASSESSMENT & PLAN:  Chronic systolic HF with improved EF:  Nonischemic CMP.  No prior history of CHF.  Echo 05/2021 showed LV EF < 20% with moderate LV dilation, severely decreased RV systolic function, moderate TR, normal IVC.  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 Also consider stress cardiomyopathy from sepsis/PNA.  No significant CAD on cath 5/23. No family history of CM. Echo 12/23: EF improved to 55-60%, stable on echocardiogram today - NYHA II. Not volume overloaded on exam. Has lasix 40 mg to use PRN.  - Not on beta blocker d/t history of bradycardia. HR okay today but did not add beta blocker since LV function has recovered - Continue Farxiga 10 mg daily. - Continue Entresto 24/26 mg bid.  - Continue spironolactone 25 mg daily. - Recent BMET 08/06 stable. - Repeat echocardiogram 55-60%, follow up with Dr. Shirlee Latch in 6 months, if still stable can likely discharge to general cardiology  Atrial flutter: Initially documented 02/23. DC-CV 05/2021 with restoration of SR. SR - Stable since event, will stop amiodarone today - Take Eliquis twice  a day. - Hgb elevated. Likely d/t smoking.  Substance Abuse: UDS + 5/23 for methamphetamines. - She denies further use.  Tobacco Abuse: Smokes 1ppd. - Discussed smoking cessation. She is not ready to quit, has tried nicotine replacement and patches in the past  Clearnce Hasten, MD Advanced Heart Failure Mechanical Circulatory Support 01/29/23

## 2023-02-01 ENCOUNTER — Encounter (HOSPITAL_COMMUNITY): Payer: Self-pay

## 2023-02-03 ENCOUNTER — Other Ambulatory Visit (HOSPITAL_COMMUNITY): Payer: Self-pay | Admitting: Cardiology

## 2023-02-11 DIAGNOSIS — F17210 Nicotine dependence, cigarettes, uncomplicated: Secondary | ICD-10-CM | POA: Diagnosis not present

## 2023-02-11 DIAGNOSIS — F909 Attention-deficit hyperactivity disorder, unspecified type: Secondary | ICD-10-CM | POA: Diagnosis not present

## 2023-02-11 DIAGNOSIS — G4700 Insomnia, unspecified: Secondary | ICD-10-CM | POA: Diagnosis not present

## 2023-02-11 DIAGNOSIS — F411 Generalized anxiety disorder: Secondary | ICD-10-CM | POA: Diagnosis not present

## 2023-02-11 DIAGNOSIS — F341 Dysthymic disorder: Secondary | ICD-10-CM | POA: Diagnosis not present

## 2023-02-11 DIAGNOSIS — Z79899 Other long term (current) drug therapy: Secondary | ICD-10-CM | POA: Diagnosis not present

## 2023-02-11 DIAGNOSIS — Z6841 Body Mass Index (BMI) 40.0 and over, adult: Secondary | ICD-10-CM | POA: Diagnosis not present

## 2023-02-16 DIAGNOSIS — Z79899 Other long term (current) drug therapy: Secondary | ICD-10-CM | POA: Diagnosis not present

## 2023-02-23 DIAGNOSIS — F411 Generalized anxiety disorder: Secondary | ICD-10-CM | POA: Diagnosis not present

## 2023-02-26 ENCOUNTER — Other Ambulatory Visit: Payer: Self-pay | Admitting: Family Medicine

## 2023-04-01 ENCOUNTER — Ambulatory Visit: Payer: Medicaid Other | Admitting: Family Medicine

## 2023-04-01 ENCOUNTER — Encounter: Payer: Self-pay | Admitting: Family Medicine

## 2023-04-01 VITALS — BP 117/74 | HR 88 | Resp 16 | Ht 62.0 in | Wt 229.1 lb

## 2023-04-01 DIAGNOSIS — Z122 Encounter for screening for malignant neoplasm of respiratory organs: Secondary | ICD-10-CM

## 2023-04-01 DIAGNOSIS — E559 Vitamin D deficiency, unspecified: Secondary | ICD-10-CM | POA: Diagnosis not present

## 2023-04-01 DIAGNOSIS — E038 Other specified hypothyroidism: Secondary | ICD-10-CM

## 2023-04-01 DIAGNOSIS — K5900 Constipation, unspecified: Secondary | ICD-10-CM | POA: Diagnosis not present

## 2023-04-01 DIAGNOSIS — Z23 Encounter for immunization: Secondary | ICD-10-CM | POA: Diagnosis not present

## 2023-04-01 DIAGNOSIS — R7301 Impaired fasting glucose: Secondary | ICD-10-CM

## 2023-04-01 DIAGNOSIS — R7989 Other specified abnormal findings of blood chemistry: Secondary | ICD-10-CM | POA: Diagnosis not present

## 2023-04-01 DIAGNOSIS — E782 Mixed hyperlipidemia: Secondary | ICD-10-CM | POA: Diagnosis not present

## 2023-04-01 DIAGNOSIS — Z8719 Personal history of other diseases of the digestive system: Secondary | ICD-10-CM | POA: Diagnosis not present

## 2023-04-01 DIAGNOSIS — R1310 Dysphagia, unspecified: Secondary | ICD-10-CM | POA: Diagnosis not present

## 2023-04-01 DIAGNOSIS — K838 Other specified diseases of biliary tract: Secondary | ICD-10-CM | POA: Diagnosis not present

## 2023-04-01 MED ORDER — PREGABALIN 200 MG PO CAPS: 200.0000 mg | ORAL_CAPSULE | Freq: Three times a day (TID) | ORAL | 0 refills | Status: DC

## 2023-04-01 NOTE — Assessment & Plan Note (Signed)
 Patient reports taking Lipitor 20 mg once daily A comprehensive physical examination was completed, and necessary labs were ordered. Screening and health maintenance recommendations have been updated. The patient received counseling on exercise and nutrition. BMI was assessed and discussed Advise for heart health, focus on: Eat more fruits and vegetables: Aim for a variety of colors. Choose whole grains: Brown rice, oats, and whole-wheat bread. Limit unhealthy fats: Avoid trans fats; use olive or avocado oil instead. Include lean proteins: Opt for fish, chicken, beans, and legumes. Reduce sodium: Limit processed foods and add less salt. Stay hydrated: Drink plenty of water. Exercise regularly: Aim for at least 30 minutes of moderate exercise, like walking or cycling, 5 days a week.

## 2023-04-01 NOTE — Progress Notes (Signed)
 Established Patient Office Visit   Subjective  Patient ID: [name], female    DOB: 04-21-1970  Age: 53 y.o. MRN: [id number]  Chief Complaint  Patient presents with   Follow-up    4 month follow up visit. Need Lyrica refills and agrees to get shingrix and flu vaccines    She  has a past medical history of A-fib (HCC), ADHD (attention deficit hyperactivity disorder), Allergy, Anxiety, Back pain, CHF (congestive heart failure) (HCC), Chronic back pain (02/04/2013), COPD (chronic obstructive pulmonary disease) (HCC), Depression, Factor 5 Leiden mutation, heterozygous (HCC), Fibromyalgia, GERD (gastroesophageal reflux disease), Heart failure (HCC), Hypertension, Laryngitis, chronic, Myocardial infarction [facility]), Panic attack, Pneumonia, Scoliosis, Sepsis (HCC) (05/2021), and Ulcer.  HPI Patient presents to the clinic for chronic follow up. For the details of today's visit, please refer to assessment and plan.   Review of Systems  Constitutional:  Negative for chills and fever.  Respiratory:  Negative for shortness of breath.   Cardiovascular:  Negative for chest pain.  Neurological:  Negative for dizziness and headaches.      Objective:     BP 117/74   Pulse 88   Resp 16   Ht 5\' 2"  (1.575 m)   Wt 229 lb 1.9 oz (103.9 kg)   LMP 09/14/2017 Comment: PT STATES SHE HAD HER TUBES CUT AND BURNT  SpO2 95%   BMI 41.91 kg/m  BP Readings from Last 3 Encounters:  04/01/23 117/74  01/29/23 104/70  12/14/22 123/78      Physical Exam Vitals reviewed.  Constitutional:      General: She is not in acute distress.    Appearance: Normal appearance. She is not ill-appearing, toxic-appearing or diaphoretic.  HENT:     Head: Normocephalic.  Eyes:     General:        Right eye: No discharge.        Left eye: No discharge.     Conjunctiva/sclera: Conjunctivae normal.  Cardiovascular:     Rate and Rhythm: Normal rate.     Pulses: Normal pulses.     Heart sounds: Normal heart  sounds.  Pulmonary:     Effort: Pulmonary effort is normal. No respiratory distress.     Breath sounds: Normal breath sounds.  Abdominal:     General: Bowel sounds are normal.     Palpations: Abdomen is soft.     Tenderness: There is no abdominal tenderness. There is no right CVA tenderness, left CVA tenderness or guarding.  Skin:    General: Skin is warm and dry.     Capillary Refill: Capillary refill takes less than 2 seconds.  Neurological:     Mental Status: She is alert.     Coordination: Coordination normal.     Gait: Gait normal.  Psychiatric:        Mood and Affect: Mood normal.        Behavior: Behavior normal.      No results found for any visits on 04/01/23.  The ASCVD Risk score (Arnett DK, et al., 2019) failed to calculate for the following reasons:   Cannot find a previous HDL lab   Cannot find a previous total cholesterol lab    Assessment & Plan:  Mixed hyperlipidemia Assessment & Plan: Patient reports taking Lipitor 20 mg once daily A comprehensive physical examination was completed, and necessary labs were ordered. Screening and health maintenance recommendations have been updated. The patient received counseling on exercise and nutrition. BMI was assessed and discussed Advise  for heart health, focus on: Eat more fruits and vegetables: Aim for a variety of colors. Choose whole grains: Brown rice, oats, and whole-wheat bread. Limit unhealthy fats: Avoid trans fats; use olive or avocado oil instead. Include lean proteins: Opt for fish, chicken, beans, and legumes. Reduce sodium: Limit processed foods and add less salt. Stay hydrated: Drink plenty of water. Exercise regularly: Aim for at least 30 minutes of moderate exercise, like walking or cycling, 5 days a week.    Orders: -     Lipid panel -     CMP14+EGFR -     CBC with Differential/Platelet  TSH (thyroid-stimulating hormone deficiency) -     TSH + free T4  Vitamin D deficiency -     VITAMIN D  25 Hydroxy (Vit-D Deficiency, Fractures)  IFG (impaired fasting glucose) -     Hemoglobin A1c  Screening for lung cancer -     Ambulatory Referral for Lung Cancer Scre  Other orders -     Pregabalin; Take 1 capsule (200 mg total) by mouth 3 (three) times daily.  Dispense: 90 capsule; Refill: 0    Return in about 6 months (around 10/02/2023), or if symptoms worsen or fail to improve, for hyperlipidemia.   Cruzita Lederer Newman Nip, FNP

## 2023-04-01 NOTE — Patient Instructions (Signed)

## 2023-04-02 ENCOUNTER — Encounter: Payer: Self-pay | Admitting: Family Medicine

## 2023-04-02 LAB — LIPID PANEL
Chol/HDL Ratio: 3.4 ratio (ref 0.0–4.4)
Cholesterol, Total: 119 mg/dL (ref 100–199)
HDL: 35 mg/dL — ABNORMAL LOW (ref >39)
LDL Chol Calc (NIH): 64 mg/dL (ref 0–99)
Triglycerides: 107 mg/dL (ref 0–149)
VLDL Cholesterol Cal: 20 mg/dL (ref 5–40)

## 2023-04-02 LAB — CBC WITH DIFFERENTIAL/PLATELET
Basophils Absolute: 0 10*3/uL (ref 0.0–0.2)
Basos: 0 %
EOS (ABSOLUTE): 0.1 10*3/uL (ref 0.0–0.4)
Eos: 1 %
Hematocrit: 52.2 % — ABNORMAL HIGH (ref 34.0–46.6)
Hemoglobin: 18.3 g/dL — ABNORMAL HIGH (ref 11.1–15.9)
Immature Grans (Abs): 0 10*3/uL (ref 0.0–0.1)
Immature Granulocytes: 0 %
Lymphocytes Absolute: 2.5 10*3/uL (ref 0.7–3.1)
Lymphs: 26 %
MCH: 32.9 pg (ref 26.6–33.0)
MCHC: 35.1 g/dL (ref 31.5–35.7)
MCV: 94 fL (ref 79–97)
Monocytes Absolute: 0.9 10*3/uL (ref 0.1–0.9)
Monocytes: 9 %
Neutrophils Absolute: 6.1 10*3/uL (ref 1.4–7.0)
Neutrophils: 64 %
Platelets: 164 10*3/uL (ref 150–450)
RBC: 5.56 x10E6/uL — ABNORMAL HIGH (ref 3.77–5.28)
RDW: 13 % (ref 11.7–15.4)
WBC: 9.6 10*3/uL (ref 3.4–10.8)

## 2023-04-02 LAB — CMP14+EGFR
ALT: 22 IU/L (ref 0–32)
AST: 23 IU/L (ref 0–40)
Albumin: 4.1 g/dL (ref 3.8–4.9)
Alkaline Phosphatase: 123 IU/L — ABNORMAL HIGH (ref 44–121)
BUN/Creatinine Ratio: 12 (ref 9–23)
BUN: 10 mg/dL (ref 6–24)
Bilirubin Total: 0.6 mg/dL (ref 0.0–1.2)
CO2: 26 mmol/L (ref 20–29)
Calcium: 9.1 mg/dL (ref 8.7–10.2)
Chloride: 99 mmol/L (ref 96–106)
Creatinine, Ser: 0.83 mg/dL (ref 0.57–1.00)
Globulin, Total: 2.7 g/dL (ref 1.5–4.5)
Glucose: 89 mg/dL (ref 70–99)
Potassium: 3.9 mmol/L (ref 3.5–5.2)
Sodium: 141 mmol/L (ref 134–144)
Total Protein: 6.8 g/dL (ref 6.0–8.5)
eGFR: 85 mL/min/{1.73_m2} (ref >59)

## 2023-04-02 LAB — HEMOGLOBIN A1C
Est. average glucose Bld gHb Est-mCnc: 117 mg/dL
Hgb A1c MFr Bld: 5.7 % — ABNORMAL HIGH (ref 4.8–5.6)

## 2023-04-02 LAB — TSH+FREE T4
Free T4: 1.34 ng/dL (ref 0.82–1.77)
TSH: 2.97 u[IU]/mL (ref 0.450–4.500)

## 2023-04-02 LAB — VITAMIN D 25 HYDROXY (VIT D DEFICIENCY, FRACTURES): Vit D, 25-Hydroxy: 84 ng/mL (ref 30.0–100.0)

## 2023-04-06 LAB — CBC WITH DIFFERENTIAL/PLATELET
Basophils Absolute: 0 10*3/uL (ref 0.0–0.2)
Basos: 0 %
EOS (ABSOLUTE): 0.1 10*3/uL (ref 0.0–0.4)
Eos: 1 %
Hematocrit: 51.4 % — ABNORMAL HIGH (ref 34.0–46.6)
Hemoglobin: 17.9 g/dL — ABNORMAL HIGH (ref 11.1–15.9)
Immature Grans (Abs): 0 10*3/uL (ref 0.0–0.1)
Immature Granulocytes: 0 %
Lymphocytes Absolute: 2.6 10*3/uL (ref 0.7–3.1)
Lymphs: 27 %
MCH: 32.7 pg (ref 26.6–33.0)
MCHC: 34.8 g/dL (ref 31.5–35.7)
MCV: 94 fL (ref 79–97)
Monocytes Absolute: 0.9 10*3/uL (ref 0.1–0.9)
Monocytes: 9 %
Neutrophils Absolute: 6.1 10*3/uL (ref 1.4–7.0)
Neutrophils: 63 %
Platelets: 162 10*3/uL (ref 150–450)
RBC: 5.48 x10E6/uL — ABNORMAL HIGH (ref 3.77–5.28)
RDW: 12.9 % (ref 11.7–15.4)
WBC: 9.7 10*3/uL (ref 3.4–10.8)

## 2023-04-06 LAB — COMPREHENSIVE METABOLIC PANEL
ALT: 20 IU/L (ref 0–32)
AST: 21 IU/L (ref 0–40)
Albumin: 4.2 g/dL (ref 3.8–4.9)
Alkaline Phosphatase: 126 IU/L — ABNORMAL HIGH (ref 44–121)
BUN/Creatinine Ratio: 13 (ref 9–23)
BUN: 10 mg/dL (ref 6–24)
Bilirubin Total: 0.7 mg/dL (ref 0.0–1.2)
CO2: 26 mmol/L (ref 20–29)
Calcium: 8.7 mg/dL (ref 8.7–10.2)
Chloride: 99 mmol/L (ref 96–106)
Creatinine, Ser: 0.8 mg/dL (ref 0.57–1.00)
Globulin, Total: 2.6 g/dL (ref 1.5–4.5)
Glucose: 86 mg/dL (ref 70–99)
Potassium: 3.8 mmol/L (ref 3.5–5.2)
Sodium: 140 mmol/L (ref 134–144)
Total Protein: 6.8 g/dL (ref 6.0–8.5)
eGFR: 89 mL/min/{1.73_m2} (ref >59)

## 2023-04-06 LAB — PROTIME-INR
INR: 1.1 (ref 0.9–1.2)
Prothrombin Time: 11.8 s (ref 9.1–12.0)

## 2023-04-06 LAB — HEMOCHROMATOSIS DNA-PCR(C282Y,H63D)

## 2023-04-06 LAB — AFP TUMOR MARKER: AFP, Serum, Tumor Marker: 2.6 ng/mL (ref 0.0–9.2)

## 2023-04-07 ENCOUNTER — Telehealth: Payer: Self-pay

## 2023-04-07 NOTE — Telephone Encounter (Signed)
 Pt is wanting to know the results to her recent lab work. Informed pt that the provider has five business days to get results/recommendations. Pt verbalized understanding.

## 2023-04-09 NOTE — Telephone Encounter (Signed)
 See result note.

## 2023-04-15 ENCOUNTER — Encounter: Payer: Self-pay | Admitting: Family Medicine

## 2023-04-15 ENCOUNTER — Telehealth: Payer: Self-pay

## 2023-04-15 ENCOUNTER — Other Ambulatory Visit: Payer: Self-pay | Admitting: Family Medicine

## 2023-04-15 DIAGNOSIS — Z122 Encounter for screening for malignant neoplasm of respiratory organs: Secondary | ICD-10-CM

## 2023-04-15 DIAGNOSIS — Z87891 Personal history of nicotine dependence: Secondary | ICD-10-CM

## 2023-04-15 DIAGNOSIS — F17210 Nicotine dependence, cigarettes, uncomplicated: Secondary | ICD-10-CM

## 2023-04-15 MED ORDER — CYCLOBENZAPRINE HCL 10 MG PO TABS: 10.0000 mg | ORAL_TABLET | Freq: Two times a day (BID) | ORAL | 2 refills | Status: DC | PRN

## 2023-04-15 NOTE — Telephone Encounter (Signed)
.  Lung Cancer Screening Narrative/Criteria Questionnaire (Cigarette Smokers Only- No Cigars/Pipes/vapes)   [name]   SDMV:05/18/2023      at 11:00 am with [facility]  March 12, 1970   LDCT: 05/19/2023 at 2:00 pm at [facility]    53 y.o.   Phone: [phone]  Lung Screening Narrative (confirm age 63-77 yrs Medicare / 50-80 yrs Private pay insurance)   Insurance information:Medicaid [employer]   Referring Provider: Jennette Banker, NP   This screening involves an initial phone call with a team member from our program. It is called a shared decision making visit. The initial meeting is required by  insurance and Medicare to make sure you understand the program. This appointment takes about 15-20 minutes to complete. You will complete the screening scan at your scheduled date/time.  This scan takes about 5-10 minutes to complete. You can eat and drink normally before and after the scan.  Criteria questions for Lung Cancer Screening:   Are you a current or former smoker? Current Age began smoking: 54   If you are a former smoker, what year did you quit smoking? (within 15 yrs)   To calculate your smoking history, I need an accurate estimate of how many packs of cigarettes you smoked per day and for how many years. (Not just the number of PPD you are now smoking)   Years smoking 36 x Packs per day 2 = Pack years 72   (at least 20 pack yrs)   (Make sure they understand that we need to know how much they have smoked in the past, not just the number of PPD they are smoking now)  Do you have a personal history of cancer?  No    Do you have a family history of cancer? Yes  (cancer type and and relative) Mother lung cancer, Brother unknown type , Maternal Grandmother Breast  Are you coughing up blood?  No  Have you had unexplained weight loss of 15 lbs or more in the last 6 months? No  It looks like you meet all criteria.  When would be a good time for [place] to schedule you for this  screening?   Additional information: N/A

## 2023-04-15 NOTE — Telephone Encounter (Signed)
 sent

## 2023-04-20 ENCOUNTER — Encounter: Payer: Self-pay | Admitting: *Deleted

## 2023-04-20 ENCOUNTER — Other Ambulatory Visit: Payer: Self-pay | Admitting: *Deleted

## 2023-04-20 DIAGNOSIS — D582 Other hemoglobinopathies: Secondary | ICD-10-CM

## 2023-04-22 ENCOUNTER — Other Ambulatory Visit: Payer: Self-pay | Admitting: Family Medicine

## 2023-04-27 ENCOUNTER — Ambulatory Visit (HOSPITAL_COMMUNITY): Admission: RE | Admit: 2023-04-27 | Source: Ambulatory Visit

## 2023-04-27 NOTE — Telephone Encounter (Signed)
 noted

## 2023-04-30 ENCOUNTER — Other Ambulatory Visit: Payer: Self-pay | Admitting: Gastroenterology

## 2023-04-30 ENCOUNTER — Ambulatory Visit (HOSPITAL_COMMUNITY)
Admission: RE | Admit: 2023-04-30 | Discharge: 2023-04-30 | Disposition: A | Discharge status: Home / Self Care | Source: Ambulatory Visit | Attending: Gastroenterology | Admitting: Gastroenterology

## 2023-04-30 DIAGNOSIS — R1310 Dysphagia, unspecified: Secondary | ICD-10-CM | POA: Diagnosis not present

## 2023-04-30 DIAGNOSIS — K5900 Constipation, unspecified: Secondary | ICD-10-CM | POA: Insufficient documentation

## 2023-04-30 DIAGNOSIS — K838 Other specified diseases of biliary tract: Secondary | ICD-10-CM | POA: Insufficient documentation

## 2023-04-30 DIAGNOSIS — Z8719 Personal history of other diseases of the digestive system: Secondary | ICD-10-CM

## 2023-04-30 DIAGNOSIS — K7290 Hepatic failure, unspecified without coma: Secondary | ICD-10-CM | POA: Diagnosis not present

## 2023-04-30 DIAGNOSIS — R7989 Other specified abnormal findings of blood chemistry: Secondary | ICD-10-CM

## 2023-04-30 DIAGNOSIS — R160 Hepatomegaly, not elsewhere classified: Secondary | ICD-10-CM | POA: Diagnosis not present

## 2023-04-30 DIAGNOSIS — K7581 Nonalcoholic steatohepatitis (NASH): Secondary | ICD-10-CM | POA: Diagnosis not present

## 2023-04-30 MED ORDER — GADOBUTROL 1 MMOL/ML IV SOLN
10.0000 mL | Freq: Once | INTRAVENOUS | Status: AC | PRN
Start: 2023-04-30 — End: 2023-04-30
  Administered 2023-04-30: 10 mL via INTRAVENOUS

## 2023-05-02 ENCOUNTER — Other Ambulatory Visit (HOSPITAL_COMMUNITY): Payer: Self-pay | Admitting: Cardiology

## 2023-05-02 ENCOUNTER — Other Ambulatory Visit: Payer: Self-pay | Admitting: Family Medicine

## 2023-05-05 ENCOUNTER — Ambulatory Visit: Admitting: Gastroenterology

## 2023-05-05 ENCOUNTER — Telehealth: Payer: Self-pay | Admitting: Gastroenterology

## 2023-05-05 MED ORDER — FLUTICASONE PROPIONATE 50 MCG/ACT NA SUSP: 2.0000 | NASAL | 3 refills | Status: DC | PRN

## 2023-05-05 MED ORDER — PREGABALIN 200 MG PO CAPS: 200.0000 mg | ORAL_CAPSULE | Freq: Three times a day (TID) | ORAL | 0 refills | Status: DC

## 2023-05-05 NOTE — Telephone Encounter (Signed)
 Pt called to cancel her appt called pt back to try to reschedule appt left voicemail on 4/23

## 2023-05-05 NOTE — Telephone Encounter (Signed)
 Please inform patient, on eliquis  can not take celebrex  increase risk of bleeding

## 2023-05-05 NOTE — Telephone Encounter (Signed)
 Lmom for pt to return call

## 2023-05-07 NOTE — Telephone Encounter (Signed)
 Pt was made aware and verbalized understanding. F/u appt was made.

## 2023-05-10 DIAGNOSIS — F411 Generalized anxiety disorder: Secondary | ICD-10-CM | POA: Diagnosis not present

## 2023-05-10 DIAGNOSIS — F909 Attention-deficit hyperactivity disorder, unspecified type: Secondary | ICD-10-CM | POA: Diagnosis not present

## 2023-05-10 DIAGNOSIS — Z6841 Body Mass Index (BMI) 40.0 and over, adult: Secondary | ICD-10-CM | POA: Diagnosis not present

## 2023-05-10 DIAGNOSIS — F341 Dysthymic disorder: Secondary | ICD-10-CM | POA: Diagnosis not present

## 2023-05-10 DIAGNOSIS — G4700 Insomnia, unspecified: Secondary | ICD-10-CM | POA: Diagnosis not present

## 2023-05-10 DIAGNOSIS — Z79899 Other long term (current) drug therapy: Secondary | ICD-10-CM | POA: Diagnosis not present

## 2023-05-10 DIAGNOSIS — F17210 Nicotine dependence, cigarettes, uncomplicated: Secondary | ICD-10-CM | POA: Diagnosis not present

## 2023-05-11 ENCOUNTER — Ambulatory Visit: Admitting: Gastroenterology

## 2023-05-12 ENCOUNTER — Other Ambulatory Visit: Payer: Self-pay | Admitting: Family Medicine

## 2023-05-17 ENCOUNTER — Ambulatory Visit: Admitting: Gastroenterology

## 2023-05-17 ENCOUNTER — Encounter: Payer: Self-pay | Admitting: Gastroenterology

## 2023-05-17 VITALS — BP 118/68 | HR 88 | Temp 98.1°F | Ht 62.0 in | Wt 231.2 lb

## 2023-05-17 DIAGNOSIS — R7989 Other specified abnormal findings of blood chemistry: Secondary | ICD-10-CM

## 2023-05-17 DIAGNOSIS — K7290 Hepatic failure, unspecified without coma: Secondary | ICD-10-CM | POA: Diagnosis not present

## 2023-05-17 DIAGNOSIS — K5900 Constipation, unspecified: Secondary | ICD-10-CM | POA: Diagnosis not present

## 2023-05-17 DIAGNOSIS — R748 Abnormal levels of other serum enzymes: Secondary | ICD-10-CM

## 2023-05-17 DIAGNOSIS — K838 Other specified diseases of biliary tract: Secondary | ICD-10-CM | POA: Diagnosis not present

## 2023-05-17 DIAGNOSIS — K219 Gastro-esophageal reflux disease without esophagitis: Secondary | ICD-10-CM

## 2023-05-17 NOTE — Patient Instructions (Signed)
 We have referred you to hematology (blood doctor) for elevated hemoglobin.  I will wait to see what labs they do before we decide if anymore labs are needed.   If you have trouble with upper abdominal pain, swallowing or uncontrolled reflux, we will do an upper endoscopy (run light into stomach). Just let me know if you have problems.   We will continue to monitor you twice each year for labs and liver ultrasound.  Your next colonoscopy is due 10/2025.

## 2023-05-17 NOTE — Progress Notes (Unsigned)
 GI Office Note    Referring Provider: Del Orbe Polanco, Ilian* Primary Care Physician:  Rosanna Comment, FNP  Primary Gastroenterologist: Rolando Cliche. Mordechai April, DO   Chief Complaint   Chief Complaint  Patient presents with   Follow-up    History of Present Illness   [name] is a 53 y.o. female presenting today for follow up. Last seen 12/2022. She has h/o chronic constipation,GERD, dysphagia, hepatic steatosis with hepatomegaly and question of lobulated contours on RUQ U/S with elevated LFTs.   Previous work up of elevated LFTs with iron 208, iron sats 57, ferritin 12, Hemochromatosis genetic markers neg. Smooth muscle Ab, AMA, ANA negative. GGT 66H, Immune to Hep B. Not immune to Hep A. HCV Ab neg. TTG IgA neg. IgA, IgG, IgM unremarkable. FIB 4 with concern for advanced fibrosis.   MRCP recommended back in 12/2022 but never completed. Recently completed MRCP. See below.    MR Abd/MRCP 04/2023 IMPRESSION: 1. Status post cholecystectomy. Dilatation of the common bile duct measuring up to 1.3 cm in caliber, similar to prior examinations. No calculus or other obstruction identified to the ampulla. This is most likely benign postoperative biliary ductal dilatation in the absence of clinical evidence of obstruction. 2. Severely atrophic left lobe of the liver with segmental intrahepatic ductal dilatation in the affected portion. This appearance is unchanged when compared to remote prior examinations and is consistent with sequelae of nonspecific prior insult, potentially traumatic, operative, or infectious/inflammatory. No central biliary obstruction appreciated. 3. Hepatomegaly.  No significant steatosis by MR.   Today:   Recent referral to hematology for chronically elevated Hgb. Appt pending. She is a chronic smoker.    No dysphagia except with bread. States probably because she has no teeth. Heartburn doing ok. We had discussed EGD before due to chronic  GERD and dysphagia but may hold off now as she is doing better. BM regular on Amitiza . No melena, brbpr.   Patient with history of dilated bile duct dating back at least to 2013, prior to having her gb removed. CBD 10.18mm at that time. Also with enlarged liver at that time.  CT in 2009 with fatty enlarged liver with prominent inferior extension with right lobe reaching iliac crest.   In 2014, CT showed hepatomegaly with small left lobe and enlarged right lobe.    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 1 Inhaler 0   ALPRAZolam  (XANAX ) 0.5 MG tablet Take 0.5 mg by mouth 2 (two) times daily as needed.     amitriptyline  (ELAVIL ) 25 MG tablet Take 25 mg by mouth at bedtime.     atorvastatin  (LIPITOR) 20 MG tablet Take 1 tablet (20 mg total) by mouth daily. 90 tablet 3   celecoxib  (CELEBREX ) 200 MG capsule TAKE ONE CAPSULE BY MOUTH ONCE DAILY. 30 capsule 4   citalopram  (CELEXA ) 20 MG tablet Take 20 mg by mouth daily. At night     cyclobenzaprine  (FLEXERIL ) 10 MG tablet Take 1 tablet (10 mg total) by mouth 2 (two) times daily as needed for muscle spasms. 60  tablet 2   ELIQUIS  5 MG TABS tablet TAKE ONE TABLET BY MOUTH 2 TIMES A DAY 60 tablet 5   FARXIGA  10 MG TABS tablet TAKE ONE TABLET BY MOUTH ONCE DAILY. 30 tablet 2   fluticasone  (FLONASE ) 50 MCG/ACT nasal spray Place 2 sprays into both nostrils as needed for allergies or rhinitis. 18.2 mL 3   ketoconazole (NIZORAL) 2 % shampoo Apply topically.     lubiprostone  (AMITIZA ) 24  MCG capsule Take 1 capsule (24 mcg total) by mouth 2 (two) times daily with a meal. 60 capsule 5   pantoprazole  (PROTONIX ) 40 MG tablet Take 40 mg by mouth daily.     pregabalin  (LYRICA ) 200 MG capsule Take 1 capsule (200 mg total) by mouth 3 (three) times daily. 90 capsule 0   revefenacin  (YUPELRI ) 175 MCG/3ML nebulizer solution Take 3 mLs (175 mcg total) by nebulization daily. (Patient taking differently: Take 175 mcg by nebulization daily. As needed) 90 mL 0   sacubitril -valsartan  (ENTRESTO ) 24-26 MG TAKE ONE TABLET BY MOUTH 2 TIMES A DAY 60 tablet 11   spironolactone  (ALDACTONE ) 25 MG tablet Take 1 tablet (25 mg total) by mouth daily. 90 tablet 3   sucralfate  (CARAFATE ) 1 g tablet Take 1 tablet (1 g total) by mouth 2 (two) times daily. 30 tablet 0   TRINTELLIX  20 MG TABS tablet Take 20 mg by mouth daily.     Vitamin D , Ergocalciferol , (DRISDOL) 1.25 MG (50000 UNIT) CAPS capsule Take 50,000 Units by mouth 2 (two) times a week.     VYVANSE 50 MG capsule Take 50 mg by mouth every morning.     zolpidem  (AMBIEN ) 10 MG tablet Take 10 mg by mouth at bedtime.     No current facility-administered medications for this visit.    Allergies   Allergies as of 05/17/2023 - Review Complete 05/17/2023  Allergen Reaction Noted   Penicillins Anaphylaxis 07/24/2010   Ibuprofen Nausea And Vomiting 10/23/2010   Aspirin  Nausea And Vomiting 10/23/2010    Review of Systems   General: Negative for anorexia, weight loss, fever, chills, fatigue, weakness. ENT: Negative for hoarseness, difficulty swallowing , nasal congestion. CV: Negative for chest pain, angina, palpitations, dyspnea on exertion, peripheral edema.  Respiratory: Negative for dyspnea at rest, dyspnea on exertion, cough, sputum, wheezing.  GI: See history of present illness. GU:  Negative for dysuria, hematuria, urinary incontinence, urinary frequency, nocturnal urination.  Endo: Negative for unusual weight change.     Physical Exam   BP  118/68 (BP Location: Right Arm, Patient Position: Sitting, Cuff Size: Large)   Pulse 88   Temp 98.1 F (36.7 C) (Oral)   Ht 5\' 2"  (1.575 m)   Wt 231 lb 3.2 oz (104.9 kg)   LMP 09/14/2017 Comment: PT STATES SHE HAD HER TUBES CUT AND BURNT  SpO2 90%   BMI 42.29 kg/m    General: Well-nourished, well-developed in no acute distress.  Eyes: No icterus. Mouth: Oropharyngeal mucosa moist and pink   Abdomen: Bowel sounds are normal, nontender, nondistended, no hepatosplenomegaly or masses,  no abdominal bruits or hernia , no rebound or guarding. Mild rectus diastasis Rectal: not performed  Extremities: No lower extremity edema. No clubbing or deformities. Neuro: Alert and oriented x 4   Skin: Warm and dry, no jaundice.   Psych: Alert and cooperative, normal mood and affect.  Labs   Lab Results  Component Value Date   NA 140 04/01/2023   CL 99 04/01/2023   K 3.8 04/01/2023  CO2 26 04/01/2023   BUN 10 04/01/2023   CREATININE 0.80 04/01/2023   EGFR 89 04/01/2023   CALCIUM  8.7 04/01/2023   PHOS 3.5 05/17/2021   ALBUMIN 4.2 04/01/2023   GLUCOSE 86 04/01/2023   Lab Results  Component Value Date   ALT 20 04/01/2023   AST 21 04/01/2023   GGT 66 (H) 08/18/2022   ALKPHOS 126 (H) 04/01/2023   BILITOT 0.7 04/01/2023   Lab Results  Component Value Date   WBC 9.7 04/01/2023   HGB 17.9 (H) 04/01/2023   HCT 51.4 (H) 04/01/2023   MCV 94 04/01/2023   PLT 162 04/01/2023   Lab Results  Component Value Date   INR 1.1 04/01/2023   INR 1.1 05/29/2021   INR 1.5 (H) 05/16/2021   Lab Results  Component Value Date   HGBA1C 5.7 (H) 04/01/2023   Lab Results  Component Value Date   TSH 2.970 04/01/2023   Lab Results  Component Value Date   IRON 208 (H) 08/18/2022   TIBC 367 08/18/2022   FERRITIN 19 08/18/2022        Imaging Studies   MR ABDOMEN MRCP W WO CONTAST Result Date: 05/01/2023 CLINICAL DATA:  Right upper quadrant abdominal pain, steatohepatitis, dilated common bile  duct, elevated LFTs EXAM: MRI ABDOMEN WITHOUT AND WITH CONTRAST (INCLUDING MRCP) TECHNIQUE: Multiplanar multisequence MR imaging of the abdomen was performed both before and after the administration of intravenous contrast. Heavily T2-weighted images of the biliary and pancreatic ducts were obtained, and three-dimensional MRCP images were rendered by post processing. CONTRAST:  10mL GADAVIST  GADOBUTROL  1 MMOL/ML IV SOLN COMPARISON:  Right upper quadrant ultrasound, 10/06/2022 CT abdomen pelvis, 08/30/2016 FINDINGS: Lower chest: No acute abnormality. Hepatobiliary: Severely atrophic left lobe of the liver with segmental intrahepatic ductal dilatation (series 5, image 13). Hepatomegaly, maximum coronal span 21.1 cm. Status post cholecystectomy. Dilatation of the common bile duct measuring up to 1.3 cm in caliber (series 9, image 20). No calculus or other obstruction identified to the ampulla. Pancreas: Unremarkable. No pancreatic ductal dilatation or surrounding inflammatory changes. Spleen: Normal in size without significant abnormality. Adrenals/Urinary Tract: Adrenal glands are unremarkable. Kidneys are normal, without renal calculi, solid lesion, or hydronephrosis. Stomach/Bowel: Stomach is within normal limits. Descending periampullary duodenal diverticulum (series 5, image 26). No evidence of bowel wall thickening, distention, or inflammatory changes. Moderate burden of stool throughout the colon. Vascular/Lymphatic: Aortic atherosclerosis. No enlarged abdominal lymph nodes. Other: No abdominal wall hernia or abnormality. No ascites. Musculoskeletal: No acute or significant osseous findings. IMPRESSION: 1. Status post cholecystectomy. Dilatation of the common bile duct measuring up to 1.3 cm in caliber, similar to prior examinations. No calculus or other obstruction identified to the ampulla. This is most likely benign postoperative biliary ductal dilatation in the absence of clinical evidence of obstruction. 2.  Severely atrophic left lobe of the liver with segmental intrahepatic ductal dilatation in the affected portion. This appearance is unchanged when compared to remote prior examinations and is consistent with sequelae of nonspecific prior insult, potentially traumatic, operative, or infectious/inflammatory. No central biliary obstruction appreciated. 3. Hepatomegaly.  No significant steatosis by MR. Electronically Signed   By: Fredricka Jenny M.D.   On: 05/01/2023 07:28   MR 3D Recon At Scanner Result Date: 05/01/2023 CLINICAL DATA:  Right upper quadrant abdominal pain, steatohepatitis, dilated common bile duct, elevated LFTs EXAM: MRI ABDOMEN WITHOUT AND WITH CONTRAST (INCLUDING MRCP) TECHNIQUE: Multiplanar multisequence MR imaging of the abdomen was performed both before and after the administration  of intravenous contrast. Heavily T2-weighted images of the biliary and pancreatic ducts were obtained, and three-dimensional MRCP images were rendered by post processing. CONTRAST:  10mL GADAVIST  GADOBUTROL  1 MMOL/ML IV SOLN COMPARISON:  Right upper quadrant ultrasound, 10/06/2022 CT abdomen pelvis, 08/30/2016 FINDINGS: Lower chest: No acute abnormality. Hepatobiliary: Severely atrophic left lobe of the liver with segmental intrahepatic ductal dilatation (series 5, image 13). Hepatomegaly, maximum coronal span 21.1 cm. Status post cholecystectomy. Dilatation of the common bile duct measuring up to 1.3 cm in caliber (series 9, image 20). No calculus or other obstruction identified to the ampulla. Pancreas: Unremarkable. No pancreatic ductal dilatation or surrounding inflammatory changes. Spleen: Normal in size without significant abnormality. Adrenals/Urinary Tract: Adrenal glands are unremarkable. Kidneys are normal, without renal calculi, solid lesion, or hydronephrosis. Stomach/Bowel: Stomach is within normal limits. Descending periampullary duodenal diverticulum (series 5, image 26). No evidence of bowel wall  thickening, distention, or inflammatory changes. Moderate burden of stool throughout the colon. Vascular/Lymphatic: Aortic atherosclerosis. No enlarged abdominal lymph nodes. Other: No abdominal wall hernia or abnormality. No ascites. Musculoskeletal: No acute or significant osseous findings. IMPRESSION: 1. Status post cholecystectomy. Dilatation of the common bile duct measuring up to 1.3 cm in caliber, similar to prior examinations. No calculus or other obstruction identified to the ampulla. This is most likely benign postoperative biliary ductal dilatation in the absence of clinical evidence of obstruction. 2. Severely atrophic left lobe of the liver with segmental intrahepatic ductal dilatation in the affected portion. This appearance is unchanged when compared to remote prior examinations and is consistent with sequelae of nonspecific prior insult, potentially traumatic, operative, or infectious/inflammatory. No central biliary obstruction appreciated. 3. Hepatomegaly.  No significant steatosis by MR. Electronically Signed   By: Fredricka Jenny M.D.   On: 05/01/2023 07:28    Assessment/Plan:     Dilated CBD/elevated LFTs/enlarged right liver lobe/atrophied left lobe: -Mildly elevated alk phos remains. She has had extensive serologies as outlined above. Iron/iron sats elevated with normal ferritin, hemochromatosis genetic markers negative. GGT elevated slightly. AMA negative.  -MRI/MRCP: chronically dilated CBD, CBD 10.9 prior to cholecystectomy in 2013. Need CBD stones or obstruction at ampulla.  -FIB 4 of 2.08 concerning for advanced fibrosis -severely atrophic left lobe of liver with segmental intrahepatic ductal dilatation in affect portion unchanged from remote prior exams, ?nonspecific prior insult, potentially traumatic, infectious/inflammatory, was noted prior to having her gallbladder out, unclear significance at this point. To discuss with Dr. Mordechai April.    Constipation: -doing well on  lubiprostone    GERD: -doing well on PPI, previous dysphagia now resolved. She wants to hold off on EGD at this time. Consider EGD at time of next colonoscopy for Barrett's screening.    Return ov in six months.     Trudie Fuse. Harles Lied, MHS, PA-C [facility]

## 2023-05-18 ENCOUNTER — Encounter: Payer: Self-pay | Admitting: Adult Health

## 2023-05-18 ENCOUNTER — Other Ambulatory Visit: Payer: Self-pay | Admitting: Family Medicine

## 2023-05-18 ENCOUNTER — Ambulatory Visit: Admitting: Adult Health

## 2023-05-18 DIAGNOSIS — F17210 Nicotine dependence, cigarettes, uncomplicated: Secondary | ICD-10-CM

## 2023-05-18 NOTE — Patient Instructions (Signed)

## 2023-05-18 NOTE — Progress Notes (Signed)
  Virtual Visit via Telephone Note  I connected with [name] , 05/18/23 10:58 AM by a telemedicine application and verified that I am speaking with the correct person using two identifiers.  Location: Patient: home Provider: home   I discussed the limitations of evaluation and management by telemedicine and the availability of in person appointments. The patient expressed understanding and agreed to proceed.   Shared Decision Making Visit Lung Cancer Screening Program [phone])   Eligibility: 53 y.o. Pack Years Smoking History Calculation = 72 pack years  (# packs/per year x # years smoked) Recent History of coughing up blood  no Unexplained weight loss? no ( >Than 15 pounds within the last 6 months ) Prior History Lung / other cancer no (Diagnosis within the last 5 years already requiring surveillance chest CT Scans). Smoking Status Current Smoker  Visit Components: Discussion included one or more decision making aids. YES Discussion included risk/benefits of screening. YES Discussion included potential follow up diagnostic testing for abnormal scans. YES Discussion included meaning and risk of over diagnosis. YES Discussion included meaning and risk of False Positives. YES Discussion included meaning of total radiation exposure. YES  Counseling Included: Importance of adherence to annual lung cancer LDCT screening. YES Impact of comorbidities on ability to participate in the program. YES Ability and willingness to under diagnostic treatment. YES  Smoking Cessation Counseling: Current Smokers:  Discussed importance of smoking cessation. yes Information about tobacco cessation classes and interventions provided to patient. yes Patient provided with "ticket" for LDCT Scan. yes Symptomatic Patient. NO Diagnosis Code: Tobacco Use Z72.0 Asymptomatic Patient yes  Counseling - 4 minutes of smoking cessation counseling (CT Chest Lung Cancer Screening Low Dose W/O CM)  [id number]  Z12.2-Screening of respiratory organs Z87.891-Personal history of nicotine  dependence   [place] Dose 05/18/23

## 2023-05-19 ENCOUNTER — Ambulatory Visit (HOSPITAL_COMMUNITY)
Admission: RE | Admit: 2023-05-19 | Discharge: 2023-05-19 | Disposition: A | Discharge status: Home / Self Care | Source: Ambulatory Visit | Attending: Acute Care | Admitting: Acute Care

## 2023-05-19 ENCOUNTER — Encounter: Payer: Self-pay | Admitting: Family Medicine

## 2023-05-19 ENCOUNTER — Ambulatory Visit (HOSPITAL_COMMUNITY)

## 2023-05-19 DIAGNOSIS — Z87891 Personal history of nicotine dependence: Secondary | ICD-10-CM | POA: Diagnosis not present

## 2023-05-19 DIAGNOSIS — F17210 Nicotine dependence, cigarettes, uncomplicated: Secondary | ICD-10-CM | POA: Insufficient documentation

## 2023-05-19 DIAGNOSIS — Z122 Encounter for screening for malignant neoplasm of respiratory organs: Secondary | ICD-10-CM | POA: Insufficient documentation

## 2023-06-01 ENCOUNTER — Other Ambulatory Visit (HOSPITAL_COMMUNITY): Payer: Self-pay | Admitting: Physician Assistant

## 2023-06-07 ENCOUNTER — Other Ambulatory Visit: Payer: Self-pay | Admitting: Family Medicine

## 2023-06-07 ENCOUNTER — Encounter: Payer: Self-pay | Admitting: Family Medicine

## 2023-06-08 NOTE — Telephone Encounter (Signed)
 sent

## 2023-06-09 ENCOUNTER — Other Ambulatory Visit: Payer: Self-pay | Admitting: Family Medicine

## 2023-06-09 NOTE — Telephone Encounter (Signed)
 I sent this yesterday

## 2023-06-14 ENCOUNTER — Telehealth: Payer: Self-pay | Admitting: Acute Care

## 2023-06-14 DIAGNOSIS — R911 Solitary pulmonary nodule: Secondary | ICD-10-CM

## 2023-06-14 NOTE — Telephone Encounter (Signed)
 Call report received:  IMPRESSION: 1. Lung-RADS 4A, suspicious. Follow up low-dose chest CT without contrast in 3 months (please use the following order, CT CHEST LCS NODULE FOLLOW-UP W/O CM) is recommended. Alternatively, PET may be considered when there is a solid component 8mm or larger. Right middle lobe nodule measures 9.8 mm mean diameter. 2. Mild diffuse bronchial thickening.   Aortic Atherosclerosis (ICD10-I70.0).   These results will be called to the ordering clinician or representative by the Radiologist Assistant, and communication documented in the PACS or [employer].     Electronically Signed   By: Chadwick Colonel M.D.   On: 06/13/2023 16:03

## 2023-06-16 NOTE — Telephone Encounter (Signed)
 Dara Ear NP has reviewed the scan. Due to this being the pt's first LDCT and due to the size of the nodule seen, 9.25mm, it has been advised the pt has a 3 month follow up scan to assure nodule stability. Will need to call patient with results and send PCP results and plan.

## 2023-06-17 NOTE — Telephone Encounter (Signed)
 LVM to call office and review recent CT results.

## 2023-06-21 ENCOUNTER — Other Ambulatory Visit: Payer: Self-pay | Admitting: Gastroenterology

## 2023-06-24 ENCOUNTER — Telehealth: Payer: Self-pay | Admitting: Acute Care

## 2023-06-24 NOTE — Telephone Encounter (Signed)
 See RN encounter note 06/24/2023

## 2023-06-24 NOTE — Telephone Encounter (Signed)
 Please call patient and let her know her scan was read as a 4 A. She will need a 3 month follow up scan to reassess this nodule with a short interval. The scan was done 05/19/2023 and read 06/13/2023. Next scan is due the week of 08/19/2023 to re-evaluate for stability vs growth.   Please fax results to PCP and let them know plan. Thanks so much.

## 2023-06-24 NOTE — Telephone Encounter (Signed)
 Called and spoke to pt. Informed her of the results of LDCT and the 9.10mm nodule seen and the concern. Patient verbalized understanding and is agreeable to 3 month follow up scan, this has been scheduled at [facility] for 4pm on 08/19/23. Pt verbalized understanding and denied any further questions or concerns at this time. Results and plan sent to PCP.

## 2023-06-24 NOTE — Addendum Note (Signed)
 Addended by: Veryl Gottron on: 06/24/2023 09:00 AM   Modules accepted: Orders

## 2023-07-04 ENCOUNTER — Other Ambulatory Visit: Payer: Self-pay | Admitting: Family Medicine

## 2023-07-08 ENCOUNTER — Other Ambulatory Visit: Payer: Self-pay | Admitting: Family Medicine

## 2023-07-08 MED ORDER — PREGABALIN 200 MG PO CAPS: 200.0000 mg | ORAL_CAPSULE | Freq: Three times a day (TID) | ORAL | 0 refills | Status: DC

## 2023-07-08 NOTE — Telephone Encounter (Signed)
 sent

## 2023-07-30 ENCOUNTER — Encounter: Payer: Self-pay | Admitting: Advanced Practice Midwife

## 2023-08-09 ENCOUNTER — Other Ambulatory Visit: Payer: Self-pay | Admitting: Family Medicine

## 2023-08-10 ENCOUNTER — Other Ambulatory Visit: Payer: Self-pay | Admitting: Family Medicine

## 2023-08-10 ENCOUNTER — Telehealth: Payer: Self-pay | Admitting: Family Medicine

## 2023-08-10 ENCOUNTER — Ambulatory Visit: Payer: Self-pay

## 2023-08-10 MED ORDER — PREGABALIN 200 MG PO CAPS: 200.0000 mg | ORAL_CAPSULE | Freq: Three times a day (TID) | ORAL | 0 refills | Status: DC

## 2023-08-10 NOTE — Telephone Encounter (Signed)
 Refill request sent to provider.

## 2023-08-10 NOTE — Telephone Encounter (Signed)
 Spoke with patient she says she will keep September appt she does not need one now but will call if this changes.

## 2023-08-10 NOTE — Telephone Encounter (Signed)
 Patient is needing a refill on pregabalin  (LYRICA ) 200 MG capsule [[id number]] says she's out. Please advise Thank you

## 2023-08-10 NOTE — Telephone Encounter (Unsigned)
 Copied from CRM [id number]. Topic: Clinical - Medication Refill >> Aug 10, 2023  1:01 PM Donee H wrote: Medication: pregabalin  (LYRICA ) 200 MG capsule  Has the patient contacted their pharmacy? Yes   This is the patient's preferred pharmacy:  [facility] - [place], [place] - [address] [place] [address] Phone: [phone] Fax: [phone]  Is this the correct pharmacy for this prescription? Yes    Has the prescription been filled recently? No  Is the patient out of the medication? Yes, took last one this morning   Has the patient been seen for an appointment in the last year OR does the patient have an upcoming appointment? Yes  Can we respond through MyChart? Yes  Agent: Please be advised that Rx refills may take up to 3 business days. We ask that you follow-up with your pharmacy.

## 2023-08-10 NOTE — Telephone Encounter (Signed)
 FYI Only or Action Required?: Action required by provider: clinical question for provider.  Patient was last seen in primary care on 04/01/2023 by [name], FNP.  Called Nurse Triage reporting Foot Swelling.  Symptoms began several days ago.  Interventions attempted: Prescription medications: lasix .  Symptoms are: unchanged.  Triage Disposition: See PCP When Office is Open (Within 3 Days)  Patient/caregiver understands and will follow disposition?: UnsureCopied from CRM #[id number]. Topic: Clinical - Red Word Triage >> Aug 10, 2023  1:08 PM Donee H wrote: [facility] that prompted transfer to Nurse Triage: Patient experiencing swelling to left foot. She stated started over the weekend. Having a tighten feeling between toes last night. She stated it comes and goes. Patient states Sunday top of foot was puffy. Patient wanted to know to see if something can be prescribed to her. She mentioned Lasix  Reason for Disposition  [1] MILD swelling of both ankles (i.e., pedal edema) AND [2] new-onset or getting worse  Answer Assessment - Initial Assessment Questions 1. ONSET: When did the swelling start? (e.g., minutes, hours, days)     Over weekend 2. LOCATION: What part of the leg is swollen?  Are both legs swollen or just one leg?     Left foot  3. SEVERITY: How bad is the swelling? (e.g., localized; mild, moderate, severe)     mild 4. REDNESS: Is there redness or signs of infection?     Redness below toes 5. PAIN: Is the swelling painful to touch? If Yes, ask: How painful is it?   (Scale 1-10; mild, moderate or severe)     denies 6. FEVER: Do you have a fever? If Yes, ask: What is it, how was it measured, and when did it start?      denies 7. CAUSE: What do you think is causing the leg swelling?     Not sure 8. MEDICAL HISTORY: Do you have a history of blood clots (e.g., DVT), cancer, heart failure, kidney disease, or liver failure?     Blood clot disease,  heart failure 9. RECURRENT SYMPTOM: Have you had leg swelling before? If Yes, ask: When was the last time? What happened that time?     yes 10. OTHER SYMPTOMS: Do you have any other symptoms? (e.g., chest pain, difficulty breathing)       denies    Left foot toes look like sausages when I get up. My toes/toe pads have weird feelings randomly. Pt shared with Cardiologist and was told to discuss with PCP. Pt is no longer on fluid pill but has been previously. Pt took old lasix  pill over weekend and it helped with swelling. Pt denies hot to touch. No pain walking. Pt is on blood thinner. Pt denies SOB/chest pain.  Pt can't accept appt tomorrow due to transporation. Pt has to give 48 hour notice. Pt is asking for next week. RN assessment is to be sooner than that. Please call and schedule pt.  Protocols used: Leg Swelling and Edema-A-AH

## 2023-08-19 ENCOUNTER — Ambulatory Visit (HOSPITAL_COMMUNITY)
Admission: RE | Admit: 2023-08-19 | Discharge: 2023-08-19 | Disposition: A | Discharge status: Home / Self Care | Source: Ambulatory Visit | Attending: Acute Care | Admitting: Acute Care

## 2023-08-19 DIAGNOSIS — R911 Solitary pulmonary nodule: Secondary | ICD-10-CM | POA: Diagnosis not present

## 2023-08-19 DIAGNOSIS — J432 Centrilobular emphysema: Secondary | ICD-10-CM | POA: Diagnosis not present

## 2023-08-19 DIAGNOSIS — I700 Atherosclerosis of aorta: Secondary | ICD-10-CM | POA: Diagnosis not present

## 2023-08-23 DIAGNOSIS — Z6841 Body Mass Index (BMI) 40.0 and over, adult: Secondary | ICD-10-CM | POA: Diagnosis not present

## 2023-08-23 DIAGNOSIS — R030 Elevated blood-pressure reading, without diagnosis of hypertension: Secondary | ICD-10-CM | POA: Diagnosis not present

## 2023-08-23 DIAGNOSIS — F411 Generalized anxiety disorder: Secondary | ICD-10-CM | POA: Diagnosis not present

## 2023-08-23 DIAGNOSIS — F909 Attention-deficit hyperactivity disorder, unspecified type: Secondary | ICD-10-CM | POA: Diagnosis not present

## 2023-08-23 DIAGNOSIS — G4700 Insomnia, unspecified: Secondary | ICD-10-CM | POA: Diagnosis not present

## 2023-08-23 DIAGNOSIS — F17210 Nicotine dependence, cigarettes, uncomplicated: Secondary | ICD-10-CM | POA: Diagnosis not present

## 2023-08-23 DIAGNOSIS — F341 Dysthymic disorder: Secondary | ICD-10-CM | POA: Diagnosis not present

## 2023-08-23 DIAGNOSIS — Z79899 Other long term (current) drug therapy: Secondary | ICD-10-CM | POA: Diagnosis not present

## 2023-08-26 DIAGNOSIS — Z79899 Other long term (current) drug therapy: Secondary | ICD-10-CM | POA: Diagnosis not present

## 2023-09-01 ENCOUNTER — Other Ambulatory Visit (HOSPITAL_COMMUNITY): Payer: Self-pay | Admitting: Cardiology

## 2023-09-08 ENCOUNTER — Other Ambulatory Visit: Payer: Self-pay | Admitting: Family Medicine

## 2023-09-08 ENCOUNTER — Telehealth: Payer: Self-pay

## 2023-09-08 ENCOUNTER — Other Ambulatory Visit (HOSPITAL_COMMUNITY): Payer: Self-pay | Admitting: Cardiology

## 2023-09-08 NOTE — Telephone Encounter (Signed)
 LVM to review results. Previous 4A, slightly decreased in size, previously 9.8 mm.  IMPRESSION: 1. Stable 9.4 mm lateral segment right middle lobe nodule. Lung-RADS 3, probably benign findings. Short-term follow-up in 6 months is recommended with repeat low-dose chest CT without contrast (please use the following order, CT CHEST LCS NODULE FOLLOW-UP W/O CM). 2.  Aortic atherosclerosis (ICD10-I70.0). 3. Enlarged pulmonic trunk, indicative of pulmonary arterial hypertension. 4.  Emphysema (ICD10-J43.9).

## 2023-09-10 ENCOUNTER — Other Ambulatory Visit: Payer: Self-pay

## 2023-09-10 DIAGNOSIS — R911 Solitary pulmonary nodule: Secondary | ICD-10-CM

## 2023-09-10 DIAGNOSIS — Z122 Encounter for screening for malignant neoplasm of respiratory organs: Secondary | ICD-10-CM

## 2023-09-10 DIAGNOSIS — F17210 Nicotine dependence, cigarettes, uncomplicated: Secondary | ICD-10-CM

## 2023-09-10 DIAGNOSIS — Z87891 Personal history of nicotine dependence: Secondary | ICD-10-CM

## 2023-09-10 NOTE — Telephone Encounter (Signed)
 I have spoken with the patient and reviewed her recent Lung CT results. She is in agreement to complete a 6 month follow up scan to evaluate the 9.4 mm nodule. Order placed and she has been scheduled for 02/22/2024. Results and plan to PCP.

## 2023-09-17 ENCOUNTER — Encounter: Payer: Self-pay | Admitting: Family Medicine

## 2023-09-17 ENCOUNTER — Telehealth: Payer: Self-pay | Admitting: Family Medicine

## 2023-09-17 NOTE — Telephone Encounter (Signed)
 Attempted to reach patient to inform [name] is doing virtual only before maternity leave so I switched her appointment- if she cannot do virtual can be scheduled with a different provider.

## 2023-10-02 ENCOUNTER — Other Ambulatory Visit: Payer: Self-pay | Admitting: Family Medicine

## 2023-10-07 ENCOUNTER — Telehealth (INDEPENDENT_AMBULATORY_CARE_PROVIDER_SITE_OTHER): Admitting: Family Medicine

## 2023-10-07 DIAGNOSIS — M545 Low back pain, unspecified: Secondary | ICD-10-CM | POA: Diagnosis not present

## 2023-10-07 DIAGNOSIS — R7303 Prediabetes: Secondary | ICD-10-CM

## 2023-10-07 NOTE — Assessment & Plan Note (Signed)
 Xray ordered Discussed to focus on maintaining good posture, using lumbar support while sitting, and avoiding prolonged sitting or heavy lifting. Engage in low-impact exercises like walking or swimming to strengthen core muscles and reduce strain on the spine. Apply heat or ice packs as needed for pain relief and consider physical therapy for targeted exercises.

## 2023-10-07 NOTE — Progress Notes (Signed)
 Virtual Visit via Video Note  I connected with [name] on 10/07/23 at  3:00 PM EDT by a video enabled telemedicine application and verified that I am speaking with the correct person using two identifiers.  Patient Location: Home Provider Location: Home Office  I discussed the limitations, risks, security, and privacy concerns of performing an evaluation and management service by video and the availability of in person appointments. I also discussed with the patient that there may be a patient responsible charge related to this service. The patient expressed understanding and agreed to proceed.  Subjective: PCP: [name], FNP  No chief complaint on file.  The patient presents with chronic back pain that occurs constantly and has been waxing and waning since onset. The pain is localized to the lumbar spine and described as cramping and aching, radiating to the right thigh. Pain severity is reported as 6/10 and remains consistent throughout the day. Symptoms are aggravated by lying down and changes in position. The patient reports stiffness all day along with associated leg pain, numbness, tingling, and weakness. Risk factors include obesity and a sedentary lifestyle. She has previously tried a muscle relaxant, which provided only mild relief.     ROS: Per HPI  Current Outpatient Medications:    albuterol  (PROVENTIL  HFA;VENTOLIN  HFA) 108 (90 BASE) MCG/ACT inhaler, Inhale 2 puffs into the lungs every 4 (four) hours as needed for wheezing or shortness of breath., Disp: 1 Inhaler, Rfl: 0   ALPRAZolam  (XANAX ) 0.5 MG tablet, Take 0.5 mg by mouth 2 (two) times daily as needed., Disp: , Rfl:    amitriptyline  (ELAVIL ) 25 MG tablet, Take 25 mg by mouth at bedtime., Disp: , Rfl:    atorvastatin  (LIPITOR) 20 MG tablet, TAKE ONE TABLET BY MOUTH ONCE DAILY., Disp: 90 tablet, Rfl: 3   celecoxib  (CELEBREX ) 200 MG capsule, TAKE ONE CAPSULE BY MOUTH ONCE DAILY., Disp: 30 capsule,  Rfl: 4   citalopram  (CELEXA ) 20 MG tablet, Take 20 mg by mouth daily. At night, Disp: , Rfl:    cyclobenzaprine  (FLEXERIL ) 10 MG tablet, TAKE ONE TABLET (10 MG total) BY MOUTH TWICE DAILY AS NEEDED FOR MUSCLE SPASMS., Disp: 60 tablet, Rfl: 2   dapagliflozin  propanediol (FARXIGA ) 10 MG TABS tablet, Take 1 tablet (10 mg total) by mouth daily. PLEASE SCHEDULE APPOINTMENT FOR MORE REFILLS, Disp: 30 tablet, Rfl: 2   ELIQUIS  5 MG TABS tablet, TAKE ONE TABLET BY MOUTH 2 TIMES A DAY, Disp: 60 tablet, Rfl: 5   fluticasone  (FLONASE ) 50 MCG/ACT nasal spray, Place 2 sprays into both nostrils as needed for allergies or rhinitis., Disp: 18.2 mL, Rfl: 3   ketoconazole (NIZORAL) 2 % shampoo, Apply topically., Disp: , Rfl:    lubiprostone  (AMITIZA ) 24 MCG capsule, TAKE ONE CAPSULE BY MOUTH 2 TIMES A DAY WITH MEALS, Disp: 60 capsule, Rfl: 5   pantoprazole  (PROTONIX ) 40 MG tablet, Take 40 mg by mouth daily., Disp: , Rfl:    pregabalin  (LYRICA ) 200 MG capsule, Take 1 capsule (200 mg total) by mouth 3 (three) times daily., Disp: 90 capsule, Rfl: 0   revefenacin  (YUPELRI ) 175 MCG/3ML nebulizer solution, Take 3 mLs (175 mcg total) by nebulization daily. (Patient taking differently: Take 175 mcg by nebulization daily. As needed), Disp: 90 mL, Rfl: 0   sacubitril -valsartan  (ENTRESTO ) 24-26 MG, TAKE ONE TABLET BY MOUTH 2 TIMES A DAY, Disp: 60 tablet, Rfl: 11   spironolactone  (ALDACTONE ) 25 MG tablet, TAKE ONE TABLET BY MOUTH ONCE DAILY., Disp: 90  tablet, Rfl: 3   sucralfate  (CARAFATE ) 1 g tablet, TAKE (1) TABLET BY MOUTH TWICE DAILY., Disp: 180 tablet, Rfl: 0   TRINTELLIX  20 MG TABS tablet, Take 20 mg by mouth daily., Disp: , Rfl:    Vitamin D , Ergocalciferol , (DRISDOL) 1.25 MG (50000 UNIT) CAPS capsule, Take 50,000 Units by mouth 2 (two) times a week., Disp: , Rfl:    VYVANSE 50 MG capsule, Take 50 mg by mouth every morning., Disp: , Rfl:    zolpidem  (AMBIEN ) 10 MG tablet, Take 10 mg by mouth at bedtime., Disp: , Rfl:    Observations/Objective: There were no vitals filed for this visit. Physical Exam Patient is alert and no acute distress noted.   Assessment and Plan: Lumbar pain Assessment & Plan: Xray ordered Discussed to focus on maintaining good posture, using lumbar support while sitting, and avoiding prolonged sitting or heavy lifting. Engage in low-impact exercises like walking or swimming to strengthen core muscles and reduce strain on the spine. Apply heat or ice packs as needed for pain relief and consider physical therapy for targeted exercises.   Orders: -     DG Lumbar Spine Complete; Future  Prediabetes Assessment & Plan: Last Hemoglobin A1c: 5.7 Labs: Ordered today, results pending; will follow up accordingly. Reviewed non-pharmacological interventions, including a balanced diet rich in lean proteins, healthy fats, whole grains, and high-fiber vegetables. Emphasized reducing refined sugars and processed carbohydrates, and incorporating more fruits, leafy greens, and legumes. Education:   Orders: -     Hemoglobin A1c    Follow Up Instructions: No follow-ups on file.   I discussed the assessment and treatment plan with the patient. The patient was provided an opportunity to ask questions, and all were answered. The patient agreed with the plan and demonstrated an understanding of the instructions.   The patient was advised to call back or seek an in-person evaluation if the symptoms worsen or if the condition fails to improve as anticipated.  The above assessment and management plan was discussed with the patient. The patient verbalized understanding of and has agreed to the management plan.   Danielle Kidd Wilhelmena Falter, FNP

## 2023-10-07 NOTE — Assessment & Plan Note (Signed)
 Last Hemoglobin A1c: 5.7 Labs: Ordered today, results pending; will follow up accordingly. Reviewed non-pharmacological interventions, including a balanced diet rich in lean proteins, healthy fats, whole grains, and high-fiber vegetables. Emphasized reducing refined sugars and processed carbohydrates, and incorporating more fruits, leafy greens, and legumes. Education:

## 2023-10-18 ENCOUNTER — Encounter: Payer: Self-pay | Admitting: Family Medicine

## 2023-10-18 ENCOUNTER — Other Ambulatory Visit: Payer: Self-pay

## 2023-10-18 ENCOUNTER — Other Ambulatory Visit: Payer: Self-pay | Admitting: Family Medicine

## 2023-10-18 MED ORDER — ALBUTEROL SULFATE HFA 108 (90 BASE) MCG/ACT IN AERS: 2.0000 | INHALATION_SPRAY | RESPIRATORY_TRACT | 0 refills | Status: DC | PRN

## 2023-10-18 NOTE — Telephone Encounter (Signed)
 Sent to pharmacy

## 2023-10-26 ENCOUNTER — Other Ambulatory Visit: Payer: Self-pay | Admitting: Family Medicine

## 2023-10-28 ENCOUNTER — Encounter: Payer: Self-pay | Admitting: Internal Medicine

## 2023-10-29 ENCOUNTER — Other Ambulatory Visit: Payer: Self-pay | Admitting: Family Medicine

## 2023-11-07 ENCOUNTER — Other Ambulatory Visit: Payer: Self-pay | Admitting: Family Medicine

## 2023-11-16 ENCOUNTER — Other Ambulatory Visit: Payer: Self-pay | Admitting: Family Medicine

## 2023-11-17 ENCOUNTER — Other Ambulatory Visit (HOSPITAL_COMMUNITY): Payer: Self-pay | Admitting: Cardiology

## 2023-11-17 MED ORDER — FUROSEMIDE 20 MG PO TABS: 40.0000 mg | ORAL_TABLET | Freq: Every day | ORAL | 11 refills | Status: DC | PRN

## 2023-11-18 DIAGNOSIS — R7303 Prediabetes: Secondary | ICD-10-CM | POA: Diagnosis not present

## 2023-11-19 LAB — HEMOGLOBIN A1C
Est. average glucose Bld gHb Est-mCnc: 123 mg/dL
Hgb A1c MFr Bld: 5.9 % — ABNORMAL HIGH (ref 4.8–5.6)

## 2023-11-29 ENCOUNTER — Other Ambulatory Visit: Payer: Self-pay | Admitting: Family Medicine

## 2023-12-02 ENCOUNTER — Telehealth (HOSPITAL_COMMUNITY): Payer: Self-pay

## 2023-12-02 NOTE — Telephone Encounter (Signed)
 Called to confirm/remind patient of their appointment at [facility] on 11/21/5/2.   Appointment:   [x] Confirmed  [] Left mess   [] No answer/No voice mail  [] VM Full/unable to leave message  [] Phone not in service  Patient reminded to bring all medications and/or complete list.  Confirmed patient has transportation. Gave directions, instructed to utilize valet parking.

## 2023-12-03 ENCOUNTER — Encounter (HOSPITAL_COMMUNITY): Payer: Self-pay

## 2023-12-03 ENCOUNTER — Ambulatory Visit (HOSPITAL_COMMUNITY): Payer: Self-pay | Admitting: Family Medicine

## 2023-12-03 ENCOUNTER — Ambulatory Visit (HOSPITAL_COMMUNITY)
Admission: RE | Admit: 2023-12-03 | Discharge: 2023-12-03 | Disposition: A | Discharge status: Home / Self Care | Source: Ambulatory Visit | Attending: Family Medicine | Admitting: Family Medicine

## 2023-12-03 VITALS — BP 130/88 | HR 88 | Wt 241.0 lb

## 2023-12-03 DIAGNOSIS — Z79899 Other long term (current) drug therapy: Secondary | ICD-10-CM | POA: Diagnosis not present

## 2023-12-03 DIAGNOSIS — I10 Essential (primary) hypertension: Secondary | ICD-10-CM

## 2023-12-03 DIAGNOSIS — J9600 Acute respiratory failure, unspecified whether with hypoxia or hypercapnia: Secondary | ICD-10-CM | POA: Insufficient documentation

## 2023-12-03 DIAGNOSIS — J154 Pneumonia due to other streptococci: Secondary | ICD-10-CM | POA: Diagnosis not present

## 2023-12-03 DIAGNOSIS — F32A Depression, unspecified: Secondary | ICD-10-CM | POA: Insufficient documentation

## 2023-12-03 DIAGNOSIS — G8929 Other chronic pain: Secondary | ICD-10-CM | POA: Diagnosis not present

## 2023-12-03 DIAGNOSIS — I5020 Unspecified systolic (congestive) heart failure: Secondary | ICD-10-CM

## 2023-12-03 DIAGNOSIS — I493 Ventricular premature depolarization: Secondary | ICD-10-CM | POA: Insufficient documentation

## 2023-12-03 DIAGNOSIS — I5040 Unspecified combined systolic (congestive) and diastolic (congestive) heart failure: Secondary | ICD-10-CM | POA: Insufficient documentation

## 2023-12-03 DIAGNOSIS — Z716 Tobacco abuse counseling: Secondary | ICD-10-CM | POA: Insufficient documentation

## 2023-12-03 DIAGNOSIS — Z8701 Personal history of pneumonia (recurrent): Secondary | ICD-10-CM | POA: Insufficient documentation

## 2023-12-03 DIAGNOSIS — Z791 Long term (current) use of non-steroidal anti-inflammatories (NSAID): Secondary | ICD-10-CM | POA: Insufficient documentation

## 2023-12-03 DIAGNOSIS — F419 Anxiety disorder, unspecified: Secondary | ICD-10-CM | POA: Insufficient documentation

## 2023-12-03 DIAGNOSIS — Z7901 Long term (current) use of anticoagulants: Secondary | ICD-10-CM | POA: Insufficient documentation

## 2023-12-03 DIAGNOSIS — Z720 Tobacco use: Secondary | ICD-10-CM | POA: Diagnosis not present

## 2023-12-03 DIAGNOSIS — I428 Other cardiomyopathies: Secondary | ICD-10-CM | POA: Insufficient documentation

## 2023-12-03 DIAGNOSIS — I110 Hypertensive heart disease with heart failure: Secondary | ICD-10-CM | POA: Insufficient documentation

## 2023-12-03 DIAGNOSIS — Z5329 Procedure and treatment not carried out because of patient's decision for other reasons: Secondary | ICD-10-CM | POA: Diagnosis not present

## 2023-12-03 DIAGNOSIS — Z555 Less than a high school diploma: Secondary | ICD-10-CM | POA: Insufficient documentation

## 2023-12-03 DIAGNOSIS — J690 Pneumonitis due to inhalation of food and vomit: Secondary | ICD-10-CM | POA: Insufficient documentation

## 2023-12-03 DIAGNOSIS — Z7984 Long term (current) use of oral hypoglycemic drugs: Secondary | ICD-10-CM | POA: Insufficient documentation

## 2023-12-03 DIAGNOSIS — F17210 Nicotine dependence, cigarettes, uncomplicated: Secondary | ICD-10-CM | POA: Insufficient documentation

## 2023-12-03 DIAGNOSIS — I4892 Unspecified atrial flutter: Secondary | ICD-10-CM | POA: Diagnosis not present

## 2023-12-03 LAB — BASIC METABOLIC PANEL WITH GFR
Anion gap: 13 (ref 5–15)
BUN: 7 mg/dL (ref 6–20)
CO2: 30 mmol/L (ref 22–32)
Calcium: 9.1 mg/dL (ref 8.9–10.3)
Chloride: 99 mmol/L (ref 98–111)
Creatinine, Ser: 0.54 mg/dL (ref 0.44–1.00)
GFR, Estimated: >60 mL/min (ref >60)
Glucose, Bld: 126 mg/dL — ABNORMAL HIGH (ref 70–99)
Potassium: 3.6 mmol/L (ref 3.5–5.1)
Sodium: 142 mmol/L (ref 135–145)

## 2023-12-03 LAB — CBC
HCT: 51.8 % — ABNORMAL HIGH (ref 36.0–46.0)
Hemoglobin: 18 g/dL — ABNORMAL HIGH (ref 12.0–15.0)
MCH: 32.5 pg (ref 26.0–34.0)
MCHC: 34.7 g/dL (ref 30.0–36.0)
MCV: 93.5 fL (ref 80.0–100.0)
Platelets: 156 K/uL (ref 150–400)
RBC: 5.54 MIL/uL — ABNORMAL HIGH (ref 3.87–5.11)
RDW: 12.4 % (ref 11.5–15.5)
WBC: 7.1 K/uL (ref 4.0–10.5)
nRBC: 0 % (ref 0.0–0.2)

## 2023-12-03 LAB — BRAIN NATRIURETIC PEPTIDE: B Natriuretic Peptide: 55.6 pg/mL (ref 0.0–100.0)

## 2023-12-03 MED ORDER — FUROSEMIDE 20 MG PO TABS: 40.0000 mg | ORAL_TABLET | Freq: Every day | ORAL | 11 refills | Status: AC | PRN

## 2023-12-03 NOTE — Progress Notes (Signed)
 [facility]  PCP: Del Wilhelmena Lloyd Sola, FNP HF Cardiologist: Dr Rolan   HPI: [name] is a 53 y.o. with a h/o chronic pain, anxiety, depression, substance abuse, A flutter, and HFrEF.    Admitted 02/23 with acute respiratory failure 2/2 strep pneumo and aspiration pneumonia. Found unresponsive and required intubation. Had AFL with RVR. Left AMA.   Admitted 05/16/21 with respiratory failure + A flutter. Found unresponsive. UDS + benzos & meth. Requiring several days of mechanical inhalation.  Echo with newly reduced EF 20-25% and RV severely reduced.  cMRI with LVEF 29%, RVEF 32%, no definite LGE. 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 No significant CAD on cath. Cardioverted back to NSR. GDMT started for heart failure.   7/23 Entresto /spiro stopped due to hyperkalemia (later added back). BB and dig stopped due to bradycardia.   Echo 12/23: EF 55-60%, RV okay  Echo 1/25 showed EF 50-55%, RV normal.  Today she returns for HF follow up. Overall feeling fair. Had LLE swelling last week and started Lasix  40 mg daily, swelling resolved. Denies increasing SOB, palpitations, abnormal bleeding, CP, dizziness, or PND/Orthopnea. Appetite ok. Not weighing at home. Taking all medications. Lives in trailer. She completed 7th grade. She has hard time reading labels.  She does not drive. Smokes 1 ppd. No ETOH or other drug use.   ECG (personally reviewed): NSR with 2 PVCs  Labs (3/25): K 3.8, creatinine 0.80, LDL 64  Cardiac Testing   - Echo (12/23): EF 55-60%, RV okay  - Echo (5/23): moderate LV dilation with EF 20-25%, moderately decreased RV systolic function.    - LHC (5/23): no significant CAD   ROS: All systems negative except as listed in HPI, PMH and Problem List.  SH:  Social History   Socioeconomic History   Marital status: Divorced    Spouse name: Not on file   Number of children: Not on file    Years of education: Not on file   Highest education level: 7th grade  Occupational History   Occupation: disabled  Tobacco Use   Smoking status: Every Day    Current packs/day: 1.50    Average packs/day: 1.5 packs/day for 25.0 years (37.5 ttl pk-yrs)    Types: Cigarettes   Smokeless tobacco: Never  Vaping Use   Vaping status: Never Used  Substance and Sexual Activity   Alcohol use: No   Drug use: No   Sexual activity: Not Currently    Birth control/protection: Surgical    Comment: tubal  Other Topics Concern   Not on file  Social History Narrative   Not on file   Social Drivers of Health   Financial Resource Strain: Low Risk  (03/31/2023)   Overall Financial Resource Strain (CARDIA)    Difficulty of Paying Living Expenses: Not very hard  Food Insecurity: Food Insecurity Present (03/31/2023)   Hunger Vital Sign    Worried About Running Out of Food in the Last Year: Sometimes true    Ran Out of Food in the Last Year: Patient declined  Transportation Needs: No Transportation Needs (03/31/2023)   PRAPARE - Administrator, Civil Service (Medical): No    Lack of Transportation (Non-Medical): No  Physical Activity: Unknown (03/31/2023)   Exercise Vital Sign    Days of Exercise per Week: 0 days    Minutes of Exercise per Session: Not on file  Recent Concern: Physical Activity -  Inactive (03/31/2023)   Exercise Vital Sign    Days of Exercise per Week: 0 days    Minutes of Exercise per Session: 0 min  Stress: Stress Concern Present (03/31/2023)   [employer] of Occupational Health - Occupational Stress Questionnaire    Feeling of Stress : To some extent  Social Connections: Unknown (03/31/2023)   Social Connection and Isolation Panel    Frequency of Communication with Friends and Family: Twice a week    Frequency of Social Gatherings with Friends and Family: More than three times a week    Attends Religious Services: Patient declined    Database Administrator or  Organizations: No    Attends Engineer, Structural: Not on file    Marital Status: Divorced  Intimate Partner Violence: Not At Risk (06/18/2022)   Humiliation, Afraid, Rape, and Kick questionnaire    Fear of Current or Ex-Partner: No    Emotionally Abused: No    Physically Abused: No    Sexually Abused: No    FH:  Family History  Problem Relation Age of Onset   Lung cancer Mother    Other Sister        choked on a biscuit   Other Brother        brittle bone disease   Cancer Brother    Aortic stenosis Son    Anxiety disorder Son    Depression Son    Atrial fibrillation Son    Heart disease Other    Arthritis Other    Lung disease Other    Cancer Other    Asthma Other    Diabetes Other    Colon cancer Neg Hx    Past Medical History:  Diagnosis Date   A-fib (HCC)    ADHD (attention deficit hyperactivity disorder)    Allergy    Anxiety    Back pain    CHF (congestive heart failure) (HCC)    Chronic back pain 02/04/2013   By patient report   COPD (chronic obstructive pulmonary disease) (HCC)    Depression    Factor 5 Leiden mutation, heterozygous    Fibromyalgia    GERD (gastroesophageal reflux disease)    Heart failure (HCC)    Hypertension    Laryngitis, chronic    Myocardial infarction (HCC)    Panic attack    Pneumonia    Scoliosis    Sepsis (HCC) 05/2021   Ulcer    Current Outpatient Medications  Medication Sig Dispense Refill   albuterol  (VENTOLIN  HFA) 108 (90 Base) MCG/ACT inhaler Inhale 2 puffs into the lungs every 4 (four) hours as needed for wheezing or shortness of breath. 18 g 0   ALPRAZolam  (XANAX ) 0.5 MG tablet Take 0.5 mg by mouth 2 (two) times daily as needed.     amitriptyline  (ELAVIL ) 25 MG tablet Take 25 mg by mouth at bedtime.     atorvastatin  (LIPITOR) 20 MG tablet TAKE ONE TABLET BY MOUTH ONCE DAILY. 90 tablet 3   celecoxib  (CELEBREX ) 200 MG capsule TAKE ONE CAPSULE BY MOUTH ONCE DAILY. 30 capsule 4   citalopram  (CELEXA ) 20 MG  tablet Take 20 mg by mouth daily. At night     cyclobenzaprine  (FLEXERIL ) 10 MG tablet TAKE ONE TABLET (10 MG total) BY MOUTH TWICE DAILY AS NEEDED FOR MUSCLE SPASMS. 60 tablet 2   dapagliflozin  propanediol (FARXIGA ) 10 MG TABS tablet Take 1 tablet (10 mg total) by mouth daily. PLEASE SCHEDULE APPOINTMENT FOR MORE REFILLS 30 tablet 2  ELIQUIS  5 MG TABS tablet TAKE ONE TABLET BY MOUTH 2 TIMES A DAY 60 tablet 5   fluticasone  (FLONASE ) 50 MCG/ACT nasal spray Place 2 sprays into both nostrils as needed for allergies or rhinitis. 16 g 3   furosemide  (LASIX ) 20 MG tablet Take 2 tablets (40 mg total) by mouth daily as needed for fluid. (Patient taking differently: Take 40 mg by mouth daily.) 60 tablet 11   ketoconazole (NIZORAL) 2 % shampoo Apply topically.     lubiprostone  (AMITIZA ) 24 MCG capsule TAKE ONE CAPSULE BY MOUTH 2 TIMES A DAY WITH MEALS 60 capsule 5   pantoprazole  (PROTONIX ) 40 MG tablet TAKE ONE TABLET BY MOUTH EVERY DAY 90 tablet 3   pregabalin  (LYRICA ) 200 MG capsule TAKE ONE CAPSULE BY MOUTH THREE TIMES DAILY 90 capsule 0   revefenacin  (YUPELRI ) 175 MCG/3ML nebulizer solution Take 3 mLs (175 mcg total) by nebulization daily. 90 mL 0   sacubitril -valsartan  (ENTRESTO ) 24-26 MG TAKE ONE TABLET BY MOUTH 2 TIMES A DAY 60 tablet 11   spironolactone  (ALDACTONE ) 25 MG tablet TAKE ONE TABLET BY MOUTH ONCE DAILY. 90 tablet 3   sucralfate  (CARAFATE ) 1 g tablet TAKE ONE TABLET BY MOUTH TWICE DAILY 180 tablet 0   TRINTELLIX  20 MG TABS tablet Take 20 mg by mouth daily.     Vitamin D , Ergocalciferol , (DRISDOL) 1.25 MG (50000 UNIT) CAPS capsule Take 50,000 Units by mouth 2 (two) times a week.     VYVANSE 50 MG capsule Take 50 mg by mouth every morning.     zolpidem  (AMBIEN ) 10 MG tablet Take 10 mg by mouth at bedtime.     No current facility-administered medications for this encounter.    Wt Readings from Last 3 Encounters:  12/03/23 109.3 kg (241 lb)  05/17/23 104.9 kg (231 lb 3.2 oz)  04/01/23  103.9 kg (229 lb 1.9 oz)   BP 130/88   Pulse 88   Wt 109.3 kg (241 lb)   LMP 09/14/2017 Comment: PT STATES SHE HAD HER TUBES CUT AND BURNT  SpO2 91%   BMI 44.08 kg/m   PHYSICAL EXAM: General:  NAD. No resp difficulty, walked into clinic HEENT: Normal Neck: Supple. No JVD. Thick neck Cor: Regular rate & rhythm. No rubs, gallops or murmurs. Lungs: Clear, diminished in bases Abdomen: Soft, obese, nontender, nondistended.  Extremities: No cyanosis, clubbing, rash, trace LLE edema Neuro: Alert & oriented x 3, moves all 4 extremities w/o difficulty. Affect pleasant.  ASSESSMENT & PLAN: 1. 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  No prior history of CHF.  Echo 05/2021 showed LV EF < 20% with moderate LV dilation, severely decreased RV systolic function, moderate TR, normal IVC.  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 Also consider stress cardiomyopathy from sepsis/PNA.  No significant CAD on cath 5/23. No family history of CM. Echo 12/23: EF improved to 55-60%. Echo 1/25 showed EF 50-55%, normal RV. NYHA I-II, she is not volume overloaded on exam. - Change Lasix  40 mg back to PRN. - Continue Farxiga  10 mg daily. - Continue Entresto  24/26 mg bid.  - Continue spironolactone  25 mg daily. BMET and BNP today. - Not on beta blocker d/t history of bradycardia. HR okay today but did not add beta blocker since LV function has recovered - Update echo. 2. Atrial flutter: Initially documented 02/23. DC-CV 05/2021 with restoration of SR. SR on ECG today - Continue Eliquis  5 mg  bid. No bleeding issues. CBC today. 3. Substance Abuse: UDS + 5/23 for methamphetamines. - She denies further use. 4. Tobacco Abuse: Smokes 1ppd. - Discussed smoking cessation. She is not ready to quit. 5. HTN: BP mildly elevated today, but she left her phone and wallet in transportation vehicle and is admittedly anxious. - Check BP at home and log;  notify clinic if sBP > 130.  Follow up in 6 months with Dr. Rolan. With recovered EF, consider graduation from AHF clinic  [place], FNP-BC 12/03/23

## 2023-12-03 NOTE — Patient Instructions (Addendum)
 Good  to see you today!  Change lasix  to 40 mg as needed  Labs done today, your results will be available in MyChart, we will contact you for abnormal readings.  Your physician has requested that you have an echocardiogram. Echocardiography is a painless test that uses sound waves to create images of your heart. It provides your doctor with information about the size and shape of your heart and how well your heart's chambers and valves are working. This procedure takes approximately one hour. There are no restrictions for this procedure. Please do NOT wear cologne, perfume, aftershave, or lotions (deodorant is allowed). Please arrive 15 minutes prior to your appointment time.  Please note: We ask at that you not bring children with you during ultrasound (echo/ vascular) testing. Due to room size and safety concerns, children are not allowed in the ultrasound rooms during exams. Our front office staff cannot provide observation of children in our lobby area while testing is being conducted. An adult accompanying a patient to their appointment will only be allowed in the ultrasound room at the discretion of the ultrasound technician under special circumstances. We apologize for any inconvenience.  Your physician recommends that you schedule a follow-up appointment 6 months(May) Call office in March to schedule an appointment  If you have any questions or concerns before your next appointment please send us  a message through [place] or call our office at [phone].    TO LEAVE A MESSAGE FOR THE NURSE SELECT OPTION 2, PLEASE LEAVE A MESSAGE INCLUDING: YOUR NAME DATE OF BIRTH CALL BACK NUMBER REASON FOR CALL**this is important as we prioritize the call backs  YOU WILL RECEIVE A CALL BACK THE SAME DAY AS LONG AS YOU CALL BEFORE 4:00 PM At the [facility], you and your health needs are our priority. As part of our continuing mission to provide you with exceptional heart care, we  have created designated Provider Care Teams. These Care Teams include your primary Cardiologist (physician) and Advanced Practice Providers (APPs- Physician Assistants and Nurse Practitioners) who all work together to provide you with the care you need, when you need it.   You may see any of the following providers on your designated Care Team at your next follow up: Dr Toribio Fuel Dr Ezra Shuck Dr. Morene Brownie Greig Mosses, NP Caffie Shed, [place] [facility] [place], [place] Beckey Coe, NP Jordan Lee, NP Ellouise Class, NP Tinnie Redman, PharmD Jaun Bash, PharmD   Please be sure to bring in all your medications bottles to every appointment.    Thank you for choosing [facility]

## 2023-12-03 NOTE — Addendum Note (Signed)
 Encounter addended by: Norma Montemurro M, RN on: 12/03/2023 2:45 PM  Actions taken: Charge Capture section accepted

## 2023-12-06 DIAGNOSIS — F411 Generalized anxiety disorder: Secondary | ICD-10-CM | POA: Diagnosis not present

## 2023-12-06 DIAGNOSIS — F341 Dysthymic disorder: Secondary | ICD-10-CM | POA: Diagnosis not present

## 2023-12-06 DIAGNOSIS — Z6841 Body Mass Index (BMI) 40.0 and over, adult: Secondary | ICD-10-CM | POA: Diagnosis not present

## 2023-12-06 DIAGNOSIS — F909 Attention-deficit hyperactivity disorder, unspecified type: Secondary | ICD-10-CM | POA: Diagnosis not present

## 2023-12-06 DIAGNOSIS — Z79899 Other long term (current) drug therapy: Secondary | ICD-10-CM | POA: Diagnosis not present

## 2023-12-06 DIAGNOSIS — R030 Elevated blood-pressure reading, without diagnosis of hypertension: Secondary | ICD-10-CM | POA: Diagnosis not present

## 2023-12-06 DIAGNOSIS — G4700 Insomnia, unspecified: Secondary | ICD-10-CM | POA: Diagnosis not present

## 2023-12-06 DIAGNOSIS — F17210 Nicotine dependence, cigarettes, uncomplicated: Secondary | ICD-10-CM | POA: Diagnosis not present

## 2023-12-10 DIAGNOSIS — Z79899 Other long term (current) drug therapy: Secondary | ICD-10-CM | POA: Diagnosis not present

## 2023-12-28 ENCOUNTER — Other Ambulatory Visit: Payer: Self-pay | Admitting: Family Medicine

## 2023-12-30 ENCOUNTER — Other Ambulatory Visit: Payer: Self-pay | Admitting: Family Medicine

## 2024-01-13 ENCOUNTER — Encounter: Payer: Self-pay | Admitting: Gastroenterology

## 2024-01-15 ENCOUNTER — Telehealth: Payer: Self-pay | Admitting: Gastroenterology

## 2024-01-15 NOTE — Telephone Encounter (Signed)
 Patient overdue for follow up ov. Please arrange follow up with dr. Cindie for abnormal liver on mrcp, elevated lfts, possible advanced fibrosis

## 2024-01-18 ENCOUNTER — Ambulatory Visit (HOSPITAL_COMMUNITY)

## 2024-01-20 ENCOUNTER — Other Ambulatory Visit: Payer: Self-pay | Admitting: Family Medicine

## 2024-01-26 ENCOUNTER — Encounter: Payer: Self-pay | Admitting: Family Medicine

## 2024-01-27 MED ORDER — PREGABALIN 200 MG PO CAPS: 200.0000 mg | ORAL_CAPSULE | Freq: Three times a day (TID) | ORAL | 1 refills | Status: AC

## 2024-01-31 ENCOUNTER — Ambulatory Visit (HOSPITAL_COMMUNITY)
Admission: RE | Admit: 2024-01-31 | Discharge: 2024-01-31 | Disposition: A | Discharge status: Home / Self Care | Source: Ambulatory Visit | Attending: Family Medicine | Admitting: Family Medicine

## 2024-01-31 DIAGNOSIS — I10 Essential (primary) hypertension: Secondary | ICD-10-CM | POA: Diagnosis not present

## 2024-01-31 DIAGNOSIS — I252 Old myocardial infarction: Secondary | ICD-10-CM | POA: Diagnosis not present

## 2024-01-31 DIAGNOSIS — I509 Heart failure, unspecified: Secondary | ICD-10-CM | POA: Diagnosis not present

## 2024-01-31 DIAGNOSIS — I110 Hypertensive heart disease with heart failure: Secondary | ICD-10-CM | POA: Diagnosis not present

## 2024-01-31 LAB — ECHOCARDIOGRAM COMPLETE
AR max vel: 2.06 cm2
AV Area VTI: 1.71 cm2
AV Area mean vel: 1.74 cm2
AV Mean grad: 9 mmHg
AV Peak grad: 14.7 mmHg
Ao pk vel: 1.92 m/s
Area-P 1/2: 3.05 cm2
Calc EF: 62.4 %
S' Lateral: 3.3 cm
Single Plane A2C EF: 63.9 %
Single Plane A4C EF: 58.7 %

## 2024-02-01 ENCOUNTER — Other Ambulatory Visit: Payer: Self-pay | Admitting: Family Medicine

## 2024-02-11 ENCOUNTER — Other Ambulatory Visit (HOSPITAL_COMMUNITY): Payer: Self-pay | Admitting: Cardiology

## 2024-02-11 MED ORDER — SACUBITRIL-VALSARTAN 24-26 MG PO TABS: 1.0000 | ORAL_TABLET | Freq: Two times a day (BID) | ORAL | 11 refills | Status: AC

## 2024-02-22 ENCOUNTER — Ambulatory Visit (HOSPITAL_COMMUNITY)

## 2024-02-24 ENCOUNTER — Ambulatory Visit: Admitting: Internal Medicine

## 2024-03-21 ENCOUNTER — Ambulatory Visit (HOSPITAL_COMMUNITY)

## 2024-04-10 ENCOUNTER — Ambulatory Visit: Payer: Self-pay

## 2024-04-21 ENCOUNTER — Ambulatory Visit
